# Patient Record
Sex: Male | Born: 1940
Health system: Southern US, Community
[De-identification: ages and names within clinical notes are randomized; demographics above are authoritative.]

## PROBLEM LIST (undated history)

## (undated) DIAGNOSIS — J449 Chronic obstructive pulmonary disease, unspecified: Secondary | ICD-10-CM

## (undated) DIAGNOSIS — I499 Cardiac arrhythmia, unspecified: Secondary | ICD-10-CM

## (undated) DIAGNOSIS — I10 Essential (primary) hypertension: Secondary | ICD-10-CM

## (undated) DIAGNOSIS — H9190 Unspecified hearing loss, unspecified ear: Secondary | ICD-10-CM

## (undated) DIAGNOSIS — Z972 Presence of dental prosthetic device (complete) (partial): Secondary | ICD-10-CM

## (undated) DIAGNOSIS — M1990 Unspecified osteoarthritis, unspecified site: Secondary | ICD-10-CM

## (undated) DIAGNOSIS — I739 Peripheral vascular disease, unspecified: Secondary | ICD-10-CM

## (undated) DIAGNOSIS — G4733 Obstructive sleep apnea (adult) (pediatric): Secondary | ICD-10-CM

## (undated) DIAGNOSIS — E119 Type 2 diabetes mellitus without complications: Secondary | ICD-10-CM

## (undated) DIAGNOSIS — Z992 Dependence on renal dialysis: Secondary | ICD-10-CM

## (undated) DIAGNOSIS — I2510 Atherosclerotic heart disease of native coronary artery without angina pectoris: Secondary | ICD-10-CM

## (undated) DIAGNOSIS — F32A Depression, unspecified: Secondary | ICD-10-CM

## (undated) DIAGNOSIS — K819 Cholecystitis, unspecified: Secondary | ICD-10-CM

## (undated) DIAGNOSIS — E039 Hypothyroidism, unspecified: Secondary | ICD-10-CM

## (undated) DIAGNOSIS — N289 Disorder of kidney and ureter, unspecified: Secondary | ICD-10-CM

## (undated) DIAGNOSIS — N186 End stage renal disease: Secondary | ICD-10-CM

## (undated) DIAGNOSIS — J189 Pneumonia, unspecified organism: Secondary | ICD-10-CM

## (undated) DIAGNOSIS — K810 Acute cholecystitis: Secondary | ICD-10-CM

## (undated) DIAGNOSIS — D649 Anemia, unspecified: Secondary | ICD-10-CM

## (undated) DIAGNOSIS — F329 Major depressive disorder, single episode, unspecified: Secondary | ICD-10-CM

## (undated) DIAGNOSIS — Z973 Presence of spectacles and contact lenses: Secondary | ICD-10-CM

## (undated) DIAGNOSIS — M4800 Spinal stenosis, site unspecified: Secondary | ICD-10-CM

## (undated) DIAGNOSIS — Z974 Presence of external hearing-aid: Secondary | ICD-10-CM

## (undated) DIAGNOSIS — I4891 Unspecified atrial fibrillation: Secondary | ICD-10-CM

## (undated) HISTORY — DX: Unspecified atrial fibrillation: I48.91

## (undated) HISTORY — PX: SHOULDER SURGERY: SHX246

## (undated) HISTORY — PX: MULTIPLE TOOTH EXTRACTIONS: SHX2053

## (undated) HISTORY — PX: HAND SURGERY: SHX662

## (undated) HISTORY — PX: CARDIAC CATHETERIZATION: SHX172

## (undated) HISTORY — PX: COLONOSCOPY W/ BIOPSIES AND POLYPECTOMY: SHX1376

## (undated) HISTORY — PX: TIBIA FRACTURE SURGERY: SHX806

## (undated) HISTORY — PX: EYE SURGERY: SHX253

---
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
## 2016-05-16 ENCOUNTER — Emergency Department (HOSPITAL_COMMUNITY): Payer: Medicare Other

## 2016-05-16 ENCOUNTER — Encounter (HOSPITAL_COMMUNITY): Payer: Self-pay | Admitting: Emergency Medicine

## 2016-05-16 ENCOUNTER — Emergency Department (HOSPITAL_COMMUNITY)
Admission: EM | Admit: 2016-05-16 | Discharge: 2016-05-16 | Disposition: A | Discharge status: Home / Self Care | Payer: Medicare Other | Attending: Emergency Medicine | Admitting: Emergency Medicine

## 2016-05-16 DIAGNOSIS — I120 Hypertensive chronic kidney disease with stage 5 chronic kidney disease or end stage renal disease: Secondary | ICD-10-CM | POA: Insufficient documentation

## 2016-05-16 DIAGNOSIS — Z992 Dependence on renal dialysis: Secondary | ICD-10-CM | POA: Insufficient documentation

## 2016-05-16 DIAGNOSIS — M549 Dorsalgia, unspecified: Secondary | ICD-10-CM | POA: Diagnosis present

## 2016-05-16 DIAGNOSIS — E1122 Type 2 diabetes mellitus with diabetic chronic kidney disease: Secondary | ICD-10-CM | POA: Insufficient documentation

## 2016-05-16 DIAGNOSIS — N186 End stage renal disease: Secondary | ICD-10-CM | POA: Insufficient documentation

## 2016-05-16 DIAGNOSIS — M545 Low back pain: Secondary | ICD-10-CM

## 2016-05-16 DIAGNOSIS — M5440 Lumbago with sciatica, unspecified side: Secondary | ICD-10-CM | POA: Insufficient documentation

## 2016-05-16 HISTORY — DX: Spinal stenosis, site unspecified: M48.00

## 2016-05-16 HISTORY — DX: Essential (primary) hypertension: I10

## 2016-05-16 HISTORY — DX: Type 2 diabetes mellitus without complications: E11.9

## 2016-05-16 HISTORY — DX: Disorder of kidney and ureter, unspecified: N28.9

## 2016-05-16 LAB — I-STAT CHEM 8, ED
BUN: 61 mg/dL — ABNORMAL HIGH (ref 6–20)
Calcium, Ion: 1.14 mmol/L (ref 1.13–1.30)
Chloride: 103 mmol/L (ref 101–111)
Creatinine, Ser: 7.8 mg/dL — ABNORMAL HIGH (ref 0.61–1.24)
Glucose, Bld: 112 mg/dL — ABNORMAL HIGH (ref 65–99)
HCT: 37 % — ABNORMAL LOW (ref 39.0–52.0)
Hemoglobin: 12.6 g/dL — ABNORMAL LOW (ref 13.0–17.0)
Potassium: 4 mmol/L (ref 3.5–5.1)
Sodium: 140 mmol/L (ref 135–145)
TCO2: 26 mmol/L (ref 0–100)

## 2016-05-16 MED ORDER — OXYCODONE-ACETAMINOPHEN 5-325 MG PO TABS: 1.0000 | ORAL_TABLET | Freq: Four times a day (QID) | ORAL | Status: DC | PRN

## 2016-05-16 NOTE — ED Notes (Signed)
Pt reports that he recently moved here and has also just started dialysis 3 weeks ago and was instructed to come here to speak to a nephrologist. Last dialysis treatment was Wednesday. Pt also reports back pain. Pt alert x4. NAD at this time.

## 2016-05-16 NOTE — ED Notes (Signed)
Patient transported to X-ray 

## 2016-05-16 NOTE — ED Provider Notes (Addendum)
CSN: [id number]     Arrival date & time 05/16/16  1058 History   First MD Initiated Contact with Patient 05/16/16 1121     Chief Complaint  Patient presents with  . Dialysis/per Justin Mend      (Consider location/radiation/quality/duration/timing/severity/associated sxs/prior Treatment) HPI Comments: Patient is a 75 year old male with history of end-stage renal disease on hemodialysis. He recently moved to the area after starting dialysis approximately 3 weeks ago. He was instructed to come to the emergency department to see about having his dialysis and to see a nephrologist to make arrangements to be dialyzed locally. He also reports back pain and a history of spinal stenosis. He denies any bowel or bladder incontinence and denies any weakness or numbness in his legs.  The history is provided by the patient.    Past Medical History  Diagnosis Date  . Renal disorder   . Hypertension   . Diabetes mellitus without complication ([place])   . Spinal stenosis    Past Surgical History  Procedure Laterality Date  . Shoulder surgery Right   . Hand surgery Right    No family history on file. Social History  Substance Use Topics  . Smoking status: Never Smoker   . Smokeless tobacco: None  . Alcohol Use: Yes    Review of Systems  All other systems reviewed and are negative.     Allergies  Sulfa antibiotics and Tetracyclines & related  Home Medications   Prior to Admission medications   Not on File   BP 151/71 mmHg  Pulse 68  Temp(Src) 98.2 F (36.8 C) (Oral)  Resp 16  SpO2 96% Physical Exam  Constitutional: He is oriented to person, place, and time. He appears well-developed and well-nourished. No distress.  HENT:  Head: Normocephalic and atraumatic.  Mouth/Throat: Oropharynx is clear and moist.  Neck: Normal range of motion. Neck supple.  Cardiovascular: Normal rate and regular rhythm.  Exam reveals no friction rub.   No murmur heard. Pulmonary/Chest: Effort normal and  breath sounds normal. No respiratory distress. He has no wheezes. He has no rales.  Abdominal: Soft. Bowel sounds are normal. He exhibits no distension. There is no tenderness.  Musculoskeletal: Normal range of motion. He exhibits no edema.  Neurological: He is alert and oriented to person, place, and time. No cranial nerve deficit. He exhibits normal muscle tone. Coordination normal.  Skin: Skin is warm and dry. He is not diaphoretic.  Nursing note and vitals reviewed.   ED Course  Procedures (including critical care time) Labs Review Labs Reviewed - No data to display  Imaging Review No results found. I have personally reviewed and evaluated these images and lab results as part of my medical decision-making.   EKG Interpretation None      MDM   Final diagnoses:  None    Patient is a 75 year old male who recently located here from [place]. He was started on dialysis 3 weeks ago. He was supposed to have arrangements made to begin dialysis here, however there was some confusion. He was told by the dialysis center in [place] to come to the emergency department here to begin dialysis. I discussed this with Dr. Florene Glen from nephrology. Together we have made arrangements for the patient to begin dialysis Monday morning at 11:30 at the [facility] in [place]. His laboratory studies show no hyperkalemia, no acidosis, and chest x-ray does not show volume overload. Dr. Florene Glen believes he is stable to forego dialysis until that time.  Patient is also  complaining of low back pain which she reports is due to spinal stenosis which she has been diagnosed within the past. He is reporting increasing pain. There is no evidence for an acute surgical problem. His strength and reflexes are symmetrical and there are no bowel or bladder complaints. I will prescribe a small quantity of Percocet which she can take for his pain.  Veryl Speak, MD 05/16/16 [place], MD 05/16/16 [phone]

## 2016-05-16 NOTE — Discharge Instructions (Signed)
Percocet as prescribed as needed for pain.  You have an appointment for Monday, June 26 at 11:30 AM at the [facility] dialysis center in [place]. Dr. Florene Glen and Dr. Justin Mend are aware of this. Dr. Florene Glen would suggest you call this afternoon to confirm this appointment. The phone number to call is [phone].   End-Stage Kidney Disease The kidneys are two organs that lie on either side of the spine between the middle of the back and the front of the abdomen. The kidneys:   Remove wastes and extra water from the blood.   Produce important hormones. These help keep bones strong, regulate blood pressure, and help create red blood cells.   Balance the fluids and chemicals in the blood and tissues. End-stage kidney disease occurs when the kidneys are so damaged that they cannot do their job. When the kidneys cannot do their job, life-threatening problems occur. The body cannot stay clean and strong without the help of the kidneys. In end-stage kidney disease, the kidneys cannot get better.You need a new kidney or treatments to do some of the work healthy kidneys do in order to stay alive. CAUSES  End-stage kidney disease usually occurs when a long-lasting (chronic) kidney disease gets worse. It may also occur after the kidneys are suddenly damaged (acute kidney injury).  SYMPTOMS   Swelling (edema) of the legs, ankles, or feet.   Tiredness (lethargy).   Nausea or vomiting.   Confusion.   Problems with urination, such as:   Decreased urine production.   Frequent urination, especially at night.   Frequent accidents in children who are potty trained.   Muscle twitches and cramps.   Persistent itchiness.   Loss of appetite.   Headaches.   Abnormally dark or light skin.   Numbness in the hands or feet.   Easy bruising.   Frequent hiccups.   Menstruation stops. DIAGNOSIS  Your health care provider will measure your blood pressure and take some tests. These  may include:   Urine tests.   Blood tests.   Imaging tests, such as:   An ultrasound exam.   Computed tomography (CT).  A kidney biopsy. TREATMENT  There are two treatments for end-stage kidney disease:   A procedure that removes toxic wastes from the body (dialysis).   Receiving a new kidney (kidney transplant). Both of these treatments have serious risks and consequences. Your health care provider will help you determine which treatment is best for you based on your health, age, and other factors. In addition to having dialysis or a kidney transplant, you may need to take medicines to control high blood pressure (hypertension) and cholesterol and to decrease phosphorus levels in your blood.  HOME CARE INSTRUCTIONS  Follow your prescribed diet.   Take medicines only as directed by your health care provider.   Do not take any new medicines (prescription, over-the-counter, or nutritional supplements) unless approved by your health care provider. Many medicines can worsen your kidney damage or need to have the dose adjusted.   Keep all follow-up visits as directed by your health care provider. MAKE SURE YOU:  Understand these instructions.  Will watch your condition.  Will get help right away if you are not doing well or get worse.   This information is not intended to replace advice given to you by your health care provider. Make sure you discuss any questions you have with your health care provider.   Document Released: 01/31/2004 Document Revised: 12/01/2014 Document Reviewed: 07/09/2012 Elsevier Interactive  Patient Education 2016 [employer].

## 2016-05-16 NOTE — ED Notes (Signed)
NAD at this time. Pt agreeable with d/c plan.

## 2016-05-16 NOTE — ED Notes (Signed)
Pt has fistula to the left forearm area. Thrill and Bruit present on assessment.

## 2016-05-24 DIAGNOSIS — N186 End stage renal disease: Secondary | ICD-10-CM | POA: Diagnosis not present

## 2016-05-24 DIAGNOSIS — Z23 Encounter for immunization: Secondary | ICD-10-CM | POA: Diagnosis not present

## 2016-05-24 DIAGNOSIS — E876 Hypokalemia: Secondary | ICD-10-CM | POA: Diagnosis not present

## 2016-05-27 DIAGNOSIS — Z23 Encounter for immunization: Secondary | ICD-10-CM | POA: Diagnosis not present

## 2016-05-27 DIAGNOSIS — E876 Hypokalemia: Secondary | ICD-10-CM | POA: Diagnosis not present

## 2016-05-27 DIAGNOSIS — N186 End stage renal disease: Secondary | ICD-10-CM | POA: Diagnosis not present

## 2016-05-29 DIAGNOSIS — N186 End stage renal disease: Secondary | ICD-10-CM | POA: Diagnosis not present

## 2016-05-29 DIAGNOSIS — Z23 Encounter for immunization: Secondary | ICD-10-CM | POA: Diagnosis not present

## 2016-05-29 DIAGNOSIS — E876 Hypokalemia: Secondary | ICD-10-CM | POA: Diagnosis not present

## 2016-05-31 DIAGNOSIS — N186 End stage renal disease: Secondary | ICD-10-CM | POA: Diagnosis not present

## 2016-05-31 DIAGNOSIS — Z23 Encounter for immunization: Secondary | ICD-10-CM | POA: Diagnosis not present

## 2016-05-31 DIAGNOSIS — E876 Hypokalemia: Secondary | ICD-10-CM | POA: Diagnosis not present

## 2016-06-02 DIAGNOSIS — E11319 Type 2 diabetes mellitus with unspecified diabetic retinopathy without macular edema: Secondary | ICD-10-CM | POA: Diagnosis not present

## 2016-06-02 DIAGNOSIS — Z79899 Other long term (current) drug therapy: Secondary | ICD-10-CM | POA: Diagnosis not present

## 2016-06-02 DIAGNOSIS — N186 End stage renal disease: Secondary | ICD-10-CM | POA: Diagnosis not present

## 2016-06-02 DIAGNOSIS — Z125 Encounter for screening for malignant neoplasm of prostate: Secondary | ICD-10-CM | POA: Diagnosis not present

## 2016-06-02 DIAGNOSIS — E1121 Type 2 diabetes mellitus with diabetic nephropathy: Secondary | ICD-10-CM | POA: Diagnosis not present

## 2016-06-02 DIAGNOSIS — E039 Hypothyroidism, unspecified: Secondary | ICD-10-CM | POA: Diagnosis not present

## 2016-06-02 DIAGNOSIS — E1142 Type 2 diabetes mellitus with diabetic polyneuropathy: Secondary | ICD-10-CM | POA: Diagnosis not present

## 2016-06-02 DIAGNOSIS — E785 Hyperlipidemia, unspecified: Secondary | ICD-10-CM | POA: Diagnosis not present

## 2016-06-03 DIAGNOSIS — Z23 Encounter for immunization: Secondary | ICD-10-CM | POA: Diagnosis not present

## 2016-06-03 DIAGNOSIS — E876 Hypokalemia: Secondary | ICD-10-CM | POA: Diagnosis not present

## 2016-06-03 DIAGNOSIS — N186 End stage renal disease: Secondary | ICD-10-CM | POA: Diagnosis not present

## 2016-06-05 DIAGNOSIS — E876 Hypokalemia: Secondary | ICD-10-CM | POA: Diagnosis not present

## 2016-06-05 DIAGNOSIS — Z23 Encounter for immunization: Secondary | ICD-10-CM | POA: Diagnosis not present

## 2016-06-05 DIAGNOSIS — N186 End stage renal disease: Secondary | ICD-10-CM | POA: Diagnosis not present

## 2016-06-07 DIAGNOSIS — E876 Hypokalemia: Secondary | ICD-10-CM | POA: Diagnosis not present

## 2016-06-07 DIAGNOSIS — N186 End stage renal disease: Secondary | ICD-10-CM | POA: Diagnosis not present

## 2016-06-07 DIAGNOSIS — Z23 Encounter for immunization: Secondary | ICD-10-CM | POA: Diagnosis not present

## 2016-06-10 DIAGNOSIS — N186 End stage renal disease: Secondary | ICD-10-CM | POA: Diagnosis not present

## 2016-06-10 DIAGNOSIS — Z23 Encounter for immunization: Secondary | ICD-10-CM | POA: Diagnosis not present

## 2016-06-10 DIAGNOSIS — E876 Hypokalemia: Secondary | ICD-10-CM | POA: Diagnosis not present

## 2016-06-12 DIAGNOSIS — E876 Hypokalemia: Secondary | ICD-10-CM | POA: Diagnosis not present

## 2016-06-12 DIAGNOSIS — N186 End stage renal disease: Secondary | ICD-10-CM | POA: Diagnosis not present

## 2016-06-12 DIAGNOSIS — Z23 Encounter for immunization: Secondary | ICD-10-CM | POA: Diagnosis not present

## 2016-06-14 DIAGNOSIS — N186 End stage renal disease: Secondary | ICD-10-CM | POA: Diagnosis not present

## 2016-06-14 DIAGNOSIS — Z23 Encounter for immunization: Secondary | ICD-10-CM | POA: Diagnosis not present

## 2016-06-14 DIAGNOSIS — E876 Hypokalemia: Secondary | ICD-10-CM | POA: Diagnosis not present

## 2016-06-17 DIAGNOSIS — E876 Hypokalemia: Secondary | ICD-10-CM | POA: Diagnosis not present

## 2016-06-17 DIAGNOSIS — N186 End stage renal disease: Secondary | ICD-10-CM | POA: Diagnosis not present

## 2016-06-17 DIAGNOSIS — Z23 Encounter for immunization: Secondary | ICD-10-CM | POA: Diagnosis not present

## 2016-06-19 DIAGNOSIS — E876 Hypokalemia: Secondary | ICD-10-CM | POA: Diagnosis not present

## 2016-06-19 DIAGNOSIS — N186 End stage renal disease: Secondary | ICD-10-CM | POA: Diagnosis not present

## 2016-06-19 DIAGNOSIS — Z23 Encounter for immunization: Secondary | ICD-10-CM | POA: Diagnosis not present

## 2016-06-19 DIAGNOSIS — E1121 Type 2 diabetes mellitus with diabetic nephropathy: Secondary | ICD-10-CM | POA: Diagnosis not present

## 2016-06-21 DIAGNOSIS — N186 End stage renal disease: Secondary | ICD-10-CM | POA: Diagnosis not present

## 2016-06-21 DIAGNOSIS — Z23 Encounter for immunization: Secondary | ICD-10-CM | POA: Diagnosis not present

## 2016-06-21 DIAGNOSIS — E876 Hypokalemia: Secondary | ICD-10-CM | POA: Diagnosis not present

## 2016-06-23 DIAGNOSIS — E1122 Type 2 diabetes mellitus with diabetic chronic kidney disease: Secondary | ICD-10-CM | POA: Diagnosis not present

## 2016-06-23 DIAGNOSIS — Z992 Dependence on renal dialysis: Secondary | ICD-10-CM | POA: Diagnosis not present

## 2016-06-23 DIAGNOSIS — N186 End stage renal disease: Secondary | ICD-10-CM | POA: Diagnosis not present

## 2016-06-24 DIAGNOSIS — E876 Hypokalemia: Secondary | ICD-10-CM | POA: Diagnosis not present

## 2016-06-24 DIAGNOSIS — E1121 Type 2 diabetes mellitus with diabetic nephropathy: Secondary | ICD-10-CM | POA: Diagnosis not present

## 2016-06-24 DIAGNOSIS — D509 Iron deficiency anemia, unspecified: Secondary | ICD-10-CM | POA: Diagnosis not present

## 2016-06-24 DIAGNOSIS — Z23 Encounter for immunization: Secondary | ICD-10-CM | POA: Diagnosis not present

## 2016-06-24 DIAGNOSIS — N186 End stage renal disease: Secondary | ICD-10-CM | POA: Diagnosis not present

## 2016-06-26 DIAGNOSIS — E1121 Type 2 diabetes mellitus with diabetic nephropathy: Secondary | ICD-10-CM | POA: Diagnosis not present

## 2016-06-26 DIAGNOSIS — E876 Hypokalemia: Secondary | ICD-10-CM | POA: Diagnosis not present

## 2016-06-26 DIAGNOSIS — Z23 Encounter for immunization: Secondary | ICD-10-CM | POA: Diagnosis not present

## 2016-06-26 DIAGNOSIS — N186 End stage renal disease: Secondary | ICD-10-CM | POA: Diagnosis not present

## 2016-06-26 DIAGNOSIS — D509 Iron deficiency anemia, unspecified: Secondary | ICD-10-CM | POA: Diagnosis not present

## 2016-06-27 DIAGNOSIS — Z992 Dependence on renal dialysis: Secondary | ICD-10-CM | POA: Diagnosis not present

## 2016-06-27 DIAGNOSIS — I871 Compression of vein: Secondary | ICD-10-CM | POA: Diagnosis not present

## 2016-06-27 DIAGNOSIS — N186 End stage renal disease: Secondary | ICD-10-CM | POA: Diagnosis not present

## 2016-06-27 DIAGNOSIS — T82858A Stenosis of vascular prosthetic devices, implants and grafts, initial encounter: Secondary | ICD-10-CM | POA: Diagnosis not present

## 2016-06-28 DIAGNOSIS — E1121 Type 2 diabetes mellitus with diabetic nephropathy: Secondary | ICD-10-CM | POA: Diagnosis not present

## 2016-06-28 DIAGNOSIS — D509 Iron deficiency anemia, unspecified: Secondary | ICD-10-CM | POA: Diagnosis not present

## 2016-06-28 DIAGNOSIS — E876 Hypokalemia: Secondary | ICD-10-CM | POA: Diagnosis not present

## 2016-06-28 DIAGNOSIS — Z23 Encounter for immunization: Secondary | ICD-10-CM | POA: Diagnosis not present

## 2016-06-28 DIAGNOSIS — N186 End stage renal disease: Secondary | ICD-10-CM | POA: Diagnosis not present

## 2016-06-30 DIAGNOSIS — I10 Essential (primary) hypertension: Secondary | ICD-10-CM | POA: Diagnosis not present

## 2016-06-30 DIAGNOSIS — M4726 Other spondylosis with radiculopathy, lumbar region: Secondary | ICD-10-CM | POA: Diagnosis not present

## 2016-06-30 DIAGNOSIS — Z6838 Body mass index (BMI) 38.0-38.9, adult: Secondary | ICD-10-CM | POA: Diagnosis not present

## 2016-07-01 DIAGNOSIS — Z23 Encounter for immunization: Secondary | ICD-10-CM | POA: Diagnosis not present

## 2016-07-01 DIAGNOSIS — N186 End stage renal disease: Secondary | ICD-10-CM | POA: Diagnosis not present

## 2016-07-01 DIAGNOSIS — E1121 Type 2 diabetes mellitus with diabetic nephropathy: Secondary | ICD-10-CM | POA: Diagnosis not present

## 2016-07-01 DIAGNOSIS — E876 Hypokalemia: Secondary | ICD-10-CM | POA: Diagnosis not present

## 2016-07-01 DIAGNOSIS — D509 Iron deficiency anemia, unspecified: Secondary | ICD-10-CM | POA: Diagnosis not present

## 2016-07-03 DIAGNOSIS — Z23 Encounter for immunization: Secondary | ICD-10-CM | POA: Diagnosis not present

## 2016-07-03 DIAGNOSIS — E1121 Type 2 diabetes mellitus with diabetic nephropathy: Secondary | ICD-10-CM | POA: Diagnosis not present

## 2016-07-03 DIAGNOSIS — E876 Hypokalemia: Secondary | ICD-10-CM | POA: Diagnosis not present

## 2016-07-03 DIAGNOSIS — D509 Iron deficiency anemia, unspecified: Secondary | ICD-10-CM | POA: Diagnosis not present

## 2016-07-03 DIAGNOSIS — N186 End stage renal disease: Secondary | ICD-10-CM | POA: Diagnosis not present

## 2016-07-05 DIAGNOSIS — N186 End stage renal disease: Secondary | ICD-10-CM | POA: Diagnosis not present

## 2016-07-05 DIAGNOSIS — Z23 Encounter for immunization: Secondary | ICD-10-CM | POA: Diagnosis not present

## 2016-07-05 DIAGNOSIS — E876 Hypokalemia: Secondary | ICD-10-CM | POA: Diagnosis not present

## 2016-07-05 DIAGNOSIS — D509 Iron deficiency anemia, unspecified: Secondary | ICD-10-CM | POA: Diagnosis not present

## 2016-07-05 DIAGNOSIS — E1121 Type 2 diabetes mellitus with diabetic nephropathy: Secondary | ICD-10-CM | POA: Diagnosis not present

## 2016-07-08 DIAGNOSIS — E876 Hypokalemia: Secondary | ICD-10-CM | POA: Diagnosis not present

## 2016-07-08 DIAGNOSIS — N186 End stage renal disease: Secondary | ICD-10-CM | POA: Diagnosis not present

## 2016-07-08 DIAGNOSIS — Z23 Encounter for immunization: Secondary | ICD-10-CM | POA: Diagnosis not present

## 2016-07-08 DIAGNOSIS — E1121 Type 2 diabetes mellitus with diabetic nephropathy: Secondary | ICD-10-CM | POA: Diagnosis not present

## 2016-07-08 DIAGNOSIS — D509 Iron deficiency anemia, unspecified: Secondary | ICD-10-CM | POA: Diagnosis not present

## 2016-07-10 DIAGNOSIS — E876 Hypokalemia: Secondary | ICD-10-CM | POA: Diagnosis not present

## 2016-07-10 DIAGNOSIS — N186 End stage renal disease: Secondary | ICD-10-CM | POA: Diagnosis not present

## 2016-07-10 DIAGNOSIS — E1121 Type 2 diabetes mellitus with diabetic nephropathy: Secondary | ICD-10-CM | POA: Diagnosis not present

## 2016-07-10 DIAGNOSIS — Z23 Encounter for immunization: Secondary | ICD-10-CM | POA: Diagnosis not present

## 2016-07-10 DIAGNOSIS — D509 Iron deficiency anemia, unspecified: Secondary | ICD-10-CM | POA: Diagnosis not present

## 2016-07-11 DIAGNOSIS — M4306 Spondylolysis, lumbar region: Secondary | ICD-10-CM | POA: Diagnosis not present

## 2016-07-11 DIAGNOSIS — M5127 Other intervertebral disc displacement, lumbosacral region: Secondary | ICD-10-CM | POA: Diagnosis not present

## 2016-07-11 DIAGNOSIS — M5126 Other intervertebral disc displacement, lumbar region: Secondary | ICD-10-CM | POA: Diagnosis not present

## 2016-07-11 DIAGNOSIS — M4806 Spinal stenosis, lumbar region: Secondary | ICD-10-CM | POA: Diagnosis not present

## 2016-07-11 DIAGNOSIS — M4807 Spinal stenosis, lumbosacral region: Secondary | ICD-10-CM | POA: Diagnosis not present

## 2016-07-11 DIAGNOSIS — M4726 Other spondylosis with radiculopathy, lumbar region: Secondary | ICD-10-CM | POA: Diagnosis not present

## 2016-07-12 DIAGNOSIS — D509 Iron deficiency anemia, unspecified: Secondary | ICD-10-CM | POA: Diagnosis not present

## 2016-07-12 DIAGNOSIS — E1121 Type 2 diabetes mellitus with diabetic nephropathy: Secondary | ICD-10-CM | POA: Diagnosis not present

## 2016-07-12 DIAGNOSIS — Z23 Encounter for immunization: Secondary | ICD-10-CM | POA: Diagnosis not present

## 2016-07-12 DIAGNOSIS — N186 End stage renal disease: Secondary | ICD-10-CM | POA: Diagnosis not present

## 2016-07-12 DIAGNOSIS — E876 Hypokalemia: Secondary | ICD-10-CM | POA: Diagnosis not present

## 2016-07-15 DIAGNOSIS — D509 Iron deficiency anemia, unspecified: Secondary | ICD-10-CM | POA: Diagnosis not present

## 2016-07-15 DIAGNOSIS — Z23 Encounter for immunization: Secondary | ICD-10-CM | POA: Diagnosis not present

## 2016-07-15 DIAGNOSIS — E1121 Type 2 diabetes mellitus with diabetic nephropathy: Secondary | ICD-10-CM | POA: Diagnosis not present

## 2016-07-15 DIAGNOSIS — N186 End stage renal disease: Secondary | ICD-10-CM | POA: Diagnosis not present

## 2016-07-15 DIAGNOSIS — E876 Hypokalemia: Secondary | ICD-10-CM | POA: Diagnosis not present

## 2016-07-17 DIAGNOSIS — Z23 Encounter for immunization: Secondary | ICD-10-CM | POA: Diagnosis not present

## 2016-07-17 DIAGNOSIS — E876 Hypokalemia: Secondary | ICD-10-CM | POA: Diagnosis not present

## 2016-07-17 DIAGNOSIS — N186 End stage renal disease: Secondary | ICD-10-CM | POA: Diagnosis not present

## 2016-07-17 DIAGNOSIS — E1121 Type 2 diabetes mellitus with diabetic nephropathy: Secondary | ICD-10-CM | POA: Diagnosis not present

## 2016-07-17 DIAGNOSIS — D509 Iron deficiency anemia, unspecified: Secondary | ICD-10-CM | POA: Diagnosis not present

## 2016-07-19 DIAGNOSIS — E1121 Type 2 diabetes mellitus with diabetic nephropathy: Secondary | ICD-10-CM | POA: Diagnosis not present

## 2016-07-19 DIAGNOSIS — N186 End stage renal disease: Secondary | ICD-10-CM | POA: Diagnosis not present

## 2016-07-19 DIAGNOSIS — Z23 Encounter for immunization: Secondary | ICD-10-CM | POA: Diagnosis not present

## 2016-07-19 DIAGNOSIS — D509 Iron deficiency anemia, unspecified: Secondary | ICD-10-CM | POA: Diagnosis not present

## 2016-07-19 DIAGNOSIS — E876 Hypokalemia: Secondary | ICD-10-CM | POA: Diagnosis not present

## 2016-07-22 DIAGNOSIS — D509 Iron deficiency anemia, unspecified: Secondary | ICD-10-CM | POA: Diagnosis not present

## 2016-07-22 DIAGNOSIS — N186 End stage renal disease: Secondary | ICD-10-CM | POA: Diagnosis not present

## 2016-07-22 DIAGNOSIS — Z23 Encounter for immunization: Secondary | ICD-10-CM | POA: Diagnosis not present

## 2016-07-22 DIAGNOSIS — E876 Hypokalemia: Secondary | ICD-10-CM | POA: Diagnosis not present

## 2016-07-22 DIAGNOSIS — E1121 Type 2 diabetes mellitus with diabetic nephropathy: Secondary | ICD-10-CM | POA: Diagnosis not present

## 2016-07-24 DIAGNOSIS — D509 Iron deficiency anemia, unspecified: Secondary | ICD-10-CM | POA: Diagnosis not present

## 2016-07-24 DIAGNOSIS — Z992 Dependence on renal dialysis: Secondary | ICD-10-CM | POA: Diagnosis not present

## 2016-07-24 DIAGNOSIS — Z23 Encounter for immunization: Secondary | ICD-10-CM | POA: Diagnosis not present

## 2016-07-24 DIAGNOSIS — E1121 Type 2 diabetes mellitus with diabetic nephropathy: Secondary | ICD-10-CM | POA: Diagnosis not present

## 2016-07-24 DIAGNOSIS — E876 Hypokalemia: Secondary | ICD-10-CM | POA: Diagnosis not present

## 2016-07-24 DIAGNOSIS — N186 End stage renal disease: Secondary | ICD-10-CM | POA: Diagnosis not present

## 2016-07-24 DIAGNOSIS — E1122 Type 2 diabetes mellitus with diabetic chronic kidney disease: Secondary | ICD-10-CM | POA: Diagnosis not present

## 2016-07-26 DIAGNOSIS — N186 End stage renal disease: Secondary | ICD-10-CM | POA: Diagnosis not present

## 2016-07-26 DIAGNOSIS — Z23 Encounter for immunization: Secondary | ICD-10-CM | POA: Diagnosis not present

## 2016-07-26 DIAGNOSIS — E876 Hypokalemia: Secondary | ICD-10-CM | POA: Diagnosis not present

## 2016-07-26 DIAGNOSIS — E1121 Type 2 diabetes mellitus with diabetic nephropathy: Secondary | ICD-10-CM | POA: Diagnosis not present

## 2016-07-26 DIAGNOSIS — D509 Iron deficiency anemia, unspecified: Secondary | ICD-10-CM | POA: Diagnosis not present

## 2016-07-29 DIAGNOSIS — N186 End stage renal disease: Secondary | ICD-10-CM | POA: Diagnosis not present

## 2016-07-29 DIAGNOSIS — E1121 Type 2 diabetes mellitus with diabetic nephropathy: Secondary | ICD-10-CM | POA: Diagnosis not present

## 2016-07-29 DIAGNOSIS — E876 Hypokalemia: Secondary | ICD-10-CM | POA: Diagnosis not present

## 2016-07-29 DIAGNOSIS — D509 Iron deficiency anemia, unspecified: Secondary | ICD-10-CM | POA: Diagnosis not present

## 2016-07-29 DIAGNOSIS — Z23 Encounter for immunization: Secondary | ICD-10-CM | POA: Diagnosis not present

## 2016-07-31 DIAGNOSIS — E1121 Type 2 diabetes mellitus with diabetic nephropathy: Secondary | ICD-10-CM | POA: Diagnosis not present

## 2016-07-31 DIAGNOSIS — Z23 Encounter for immunization: Secondary | ICD-10-CM | POA: Diagnosis not present

## 2016-07-31 DIAGNOSIS — N186 End stage renal disease: Secondary | ICD-10-CM | POA: Diagnosis not present

## 2016-07-31 DIAGNOSIS — D509 Iron deficiency anemia, unspecified: Secondary | ICD-10-CM | POA: Diagnosis not present

## 2016-07-31 DIAGNOSIS — E876 Hypokalemia: Secondary | ICD-10-CM | POA: Diagnosis not present

## 2016-08-01 DIAGNOSIS — H3582 Retinal ischemia: Secondary | ICD-10-CM | POA: Diagnosis not present

## 2016-08-01 DIAGNOSIS — E113593 Type 2 diabetes mellitus with proliferative diabetic retinopathy without macular edema, bilateral: Secondary | ICD-10-CM | POA: Diagnosis not present

## 2016-08-02 DIAGNOSIS — E1121 Type 2 diabetes mellitus with diabetic nephropathy: Secondary | ICD-10-CM | POA: Diagnosis not present

## 2016-08-02 DIAGNOSIS — D509 Iron deficiency anemia, unspecified: Secondary | ICD-10-CM | POA: Diagnosis not present

## 2016-08-02 DIAGNOSIS — Z23 Encounter for immunization: Secondary | ICD-10-CM | POA: Diagnosis not present

## 2016-08-02 DIAGNOSIS — N186 End stage renal disease: Secondary | ICD-10-CM | POA: Diagnosis not present

## 2016-08-02 DIAGNOSIS — E876 Hypokalemia: Secondary | ICD-10-CM | POA: Diagnosis not present

## 2016-08-05 DIAGNOSIS — N186 End stage renal disease: Secondary | ICD-10-CM | POA: Diagnosis not present

## 2016-08-05 DIAGNOSIS — Z23 Encounter for immunization: Secondary | ICD-10-CM | POA: Diagnosis not present

## 2016-08-05 DIAGNOSIS — E1121 Type 2 diabetes mellitus with diabetic nephropathy: Secondary | ICD-10-CM | POA: Diagnosis not present

## 2016-08-05 DIAGNOSIS — D509 Iron deficiency anemia, unspecified: Secondary | ICD-10-CM | POA: Diagnosis not present

## 2016-08-05 DIAGNOSIS — E876 Hypokalemia: Secondary | ICD-10-CM | POA: Diagnosis not present

## 2016-08-07 DIAGNOSIS — Z23 Encounter for immunization: Secondary | ICD-10-CM | POA: Diagnosis not present

## 2016-08-07 DIAGNOSIS — N186 End stage renal disease: Secondary | ICD-10-CM | POA: Diagnosis not present

## 2016-08-07 DIAGNOSIS — D509 Iron deficiency anemia, unspecified: Secondary | ICD-10-CM | POA: Diagnosis not present

## 2016-08-07 DIAGNOSIS — E876 Hypokalemia: Secondary | ICD-10-CM | POA: Diagnosis not present

## 2016-08-07 DIAGNOSIS — E1121 Type 2 diabetes mellitus with diabetic nephropathy: Secondary | ICD-10-CM | POA: Diagnosis not present

## 2016-08-09 DIAGNOSIS — Z23 Encounter for immunization: Secondary | ICD-10-CM | POA: Diagnosis not present

## 2016-08-09 DIAGNOSIS — E1121 Type 2 diabetes mellitus with diabetic nephropathy: Secondary | ICD-10-CM | POA: Diagnosis not present

## 2016-08-09 DIAGNOSIS — D509 Iron deficiency anemia, unspecified: Secondary | ICD-10-CM | POA: Diagnosis not present

## 2016-08-09 DIAGNOSIS — E876 Hypokalemia: Secondary | ICD-10-CM | POA: Diagnosis not present

## 2016-08-09 DIAGNOSIS — N186 End stage renal disease: Secondary | ICD-10-CM | POA: Diagnosis not present

## 2016-08-11 DIAGNOSIS — M5126 Other intervertebral disc displacement, lumbar region: Secondary | ICD-10-CM | POA: Diagnosis not present

## 2016-08-12 DIAGNOSIS — D509 Iron deficiency anemia, unspecified: Secondary | ICD-10-CM | POA: Diagnosis not present

## 2016-08-12 DIAGNOSIS — E1121 Type 2 diabetes mellitus with diabetic nephropathy: Secondary | ICD-10-CM | POA: Diagnosis not present

## 2016-08-12 DIAGNOSIS — Z23 Encounter for immunization: Secondary | ICD-10-CM | POA: Diagnosis not present

## 2016-08-12 DIAGNOSIS — E876 Hypokalemia: Secondary | ICD-10-CM | POA: Diagnosis not present

## 2016-08-12 DIAGNOSIS — N186 End stage renal disease: Secondary | ICD-10-CM | POA: Diagnosis not present

## 2016-08-14 DIAGNOSIS — N186 End stage renal disease: Secondary | ICD-10-CM | POA: Diagnosis not present

## 2016-08-14 DIAGNOSIS — Z23 Encounter for immunization: Secondary | ICD-10-CM | POA: Diagnosis not present

## 2016-08-14 DIAGNOSIS — E1121 Type 2 diabetes mellitus with diabetic nephropathy: Secondary | ICD-10-CM | POA: Diagnosis not present

## 2016-08-14 DIAGNOSIS — D509 Iron deficiency anemia, unspecified: Secondary | ICD-10-CM | POA: Diagnosis not present

## 2016-08-14 DIAGNOSIS — E876 Hypokalemia: Secondary | ICD-10-CM | POA: Diagnosis not present

## 2016-08-16 DIAGNOSIS — N186 End stage renal disease: Secondary | ICD-10-CM | POA: Diagnosis not present

## 2016-08-16 DIAGNOSIS — Z23 Encounter for immunization: Secondary | ICD-10-CM | POA: Diagnosis not present

## 2016-08-16 DIAGNOSIS — E1121 Type 2 diabetes mellitus with diabetic nephropathy: Secondary | ICD-10-CM | POA: Diagnosis not present

## 2016-08-16 DIAGNOSIS — E876 Hypokalemia: Secondary | ICD-10-CM | POA: Diagnosis not present

## 2016-08-16 DIAGNOSIS — D509 Iron deficiency anemia, unspecified: Secondary | ICD-10-CM | POA: Diagnosis not present

## 2016-08-19 DIAGNOSIS — E876 Hypokalemia: Secondary | ICD-10-CM | POA: Diagnosis not present

## 2016-08-19 DIAGNOSIS — D509 Iron deficiency anemia, unspecified: Secondary | ICD-10-CM | POA: Diagnosis not present

## 2016-08-19 DIAGNOSIS — E1121 Type 2 diabetes mellitus with diabetic nephropathy: Secondary | ICD-10-CM | POA: Diagnosis not present

## 2016-08-19 DIAGNOSIS — Z23 Encounter for immunization: Secondary | ICD-10-CM | POA: Diagnosis not present

## 2016-08-19 DIAGNOSIS — N186 End stage renal disease: Secondary | ICD-10-CM | POA: Diagnosis not present

## 2016-08-21 DIAGNOSIS — N186 End stage renal disease: Secondary | ICD-10-CM | POA: Diagnosis not present

## 2016-08-21 DIAGNOSIS — Z23 Encounter for immunization: Secondary | ICD-10-CM | POA: Diagnosis not present

## 2016-08-21 DIAGNOSIS — E1121 Type 2 diabetes mellitus with diabetic nephropathy: Secondary | ICD-10-CM | POA: Diagnosis not present

## 2016-08-21 DIAGNOSIS — E876 Hypokalemia: Secondary | ICD-10-CM | POA: Diagnosis not present

## 2016-08-21 DIAGNOSIS — D509 Iron deficiency anemia, unspecified: Secondary | ICD-10-CM | POA: Diagnosis not present

## 2016-08-23 DIAGNOSIS — D509 Iron deficiency anemia, unspecified: Secondary | ICD-10-CM | POA: Diagnosis not present

## 2016-08-23 DIAGNOSIS — E1122 Type 2 diabetes mellitus with diabetic chronic kidney disease: Secondary | ICD-10-CM | POA: Diagnosis not present

## 2016-08-23 DIAGNOSIS — E876 Hypokalemia: Secondary | ICD-10-CM | POA: Diagnosis not present

## 2016-08-23 DIAGNOSIS — N186 End stage renal disease: Secondary | ICD-10-CM | POA: Diagnosis not present

## 2016-08-23 DIAGNOSIS — Z23 Encounter for immunization: Secondary | ICD-10-CM | POA: Diagnosis not present

## 2016-08-23 DIAGNOSIS — E1121 Type 2 diabetes mellitus with diabetic nephropathy: Secondary | ICD-10-CM | POA: Diagnosis not present

## 2016-08-23 DIAGNOSIS — Z992 Dependence on renal dialysis: Secondary | ICD-10-CM | POA: Diagnosis not present

## 2016-08-26 DIAGNOSIS — E876 Hypokalemia: Secondary | ICD-10-CM | POA: Diagnosis not present

## 2016-08-26 DIAGNOSIS — D509 Iron deficiency anemia, unspecified: Secondary | ICD-10-CM | POA: Diagnosis not present

## 2016-08-26 DIAGNOSIS — N186 End stage renal disease: Secondary | ICD-10-CM | POA: Diagnosis not present

## 2016-08-26 DIAGNOSIS — N2581 Secondary hyperparathyroidism of renal origin: Secondary | ICD-10-CM | POA: Diagnosis not present

## 2016-08-26 DIAGNOSIS — Z23 Encounter for immunization: Secondary | ICD-10-CM | POA: Diagnosis not present

## 2016-08-28 DIAGNOSIS — Z23 Encounter for immunization: Secondary | ICD-10-CM | POA: Diagnosis not present

## 2016-08-28 DIAGNOSIS — E876 Hypokalemia: Secondary | ICD-10-CM | POA: Diagnosis not present

## 2016-08-28 DIAGNOSIS — D509 Iron deficiency anemia, unspecified: Secondary | ICD-10-CM | POA: Diagnosis not present

## 2016-08-28 DIAGNOSIS — N2581 Secondary hyperparathyroidism of renal origin: Secondary | ICD-10-CM | POA: Diagnosis not present

## 2016-08-28 DIAGNOSIS — N186 End stage renal disease: Secondary | ICD-10-CM | POA: Diagnosis not present

## 2016-08-29 DIAGNOSIS — M5136 Other intervertebral disc degeneration, lumbar region: Secondary | ICD-10-CM | POA: Diagnosis not present

## 2016-08-29 DIAGNOSIS — Z6838 Body mass index (BMI) 38.0-38.9, adult: Secondary | ICD-10-CM | POA: Diagnosis not present

## 2016-08-29 DIAGNOSIS — M5416 Radiculopathy, lumbar region: Secondary | ICD-10-CM | POA: Diagnosis not present

## 2016-08-29 DIAGNOSIS — M4726 Other spondylosis with radiculopathy, lumbar region: Secondary | ICD-10-CM | POA: Diagnosis not present

## 2016-08-30 DIAGNOSIS — E876 Hypokalemia: Secondary | ICD-10-CM | POA: Diagnosis not present

## 2016-08-30 DIAGNOSIS — Z23 Encounter for immunization: Secondary | ICD-10-CM | POA: Diagnosis not present

## 2016-08-30 DIAGNOSIS — D509 Iron deficiency anemia, unspecified: Secondary | ICD-10-CM | POA: Diagnosis not present

## 2016-08-30 DIAGNOSIS — N2581 Secondary hyperparathyroidism of renal origin: Secondary | ICD-10-CM | POA: Diagnosis not present

## 2016-08-30 DIAGNOSIS — N186 End stage renal disease: Secondary | ICD-10-CM | POA: Diagnosis not present

## 2016-09-02 DIAGNOSIS — D509 Iron deficiency anemia, unspecified: Secondary | ICD-10-CM | POA: Diagnosis not present

## 2016-09-02 DIAGNOSIS — N2581 Secondary hyperparathyroidism of renal origin: Secondary | ICD-10-CM | POA: Diagnosis not present

## 2016-09-02 DIAGNOSIS — Z23 Encounter for immunization: Secondary | ICD-10-CM | POA: Diagnosis not present

## 2016-09-02 DIAGNOSIS — E876 Hypokalemia: Secondary | ICD-10-CM | POA: Diagnosis not present

## 2016-09-02 DIAGNOSIS — N186 End stage renal disease: Secondary | ICD-10-CM | POA: Diagnosis not present

## 2016-09-04 DIAGNOSIS — N2581 Secondary hyperparathyroidism of renal origin: Secondary | ICD-10-CM | POA: Diagnosis not present

## 2016-09-04 DIAGNOSIS — Z23 Encounter for immunization: Secondary | ICD-10-CM | POA: Diagnosis not present

## 2016-09-04 DIAGNOSIS — E876 Hypokalemia: Secondary | ICD-10-CM | POA: Diagnosis not present

## 2016-09-04 DIAGNOSIS — D509 Iron deficiency anemia, unspecified: Secondary | ICD-10-CM | POA: Diagnosis not present

## 2016-09-04 DIAGNOSIS — N186 End stage renal disease: Secondary | ICD-10-CM | POA: Diagnosis not present

## 2016-09-06 DIAGNOSIS — E876 Hypokalemia: Secondary | ICD-10-CM | POA: Diagnosis not present

## 2016-09-06 DIAGNOSIS — Z23 Encounter for immunization: Secondary | ICD-10-CM | POA: Diagnosis not present

## 2016-09-06 DIAGNOSIS — N186 End stage renal disease: Secondary | ICD-10-CM | POA: Diagnosis not present

## 2016-09-06 DIAGNOSIS — D509 Iron deficiency anemia, unspecified: Secondary | ICD-10-CM | POA: Diagnosis not present

## 2016-09-06 DIAGNOSIS — N2581 Secondary hyperparathyroidism of renal origin: Secondary | ICD-10-CM | POA: Diagnosis not present

## 2016-09-09 DIAGNOSIS — D509 Iron deficiency anemia, unspecified: Secondary | ICD-10-CM | POA: Diagnosis not present

## 2016-09-09 DIAGNOSIS — E876 Hypokalemia: Secondary | ICD-10-CM | POA: Diagnosis not present

## 2016-09-09 DIAGNOSIS — N2581 Secondary hyperparathyroidism of renal origin: Secondary | ICD-10-CM | POA: Diagnosis not present

## 2016-09-09 DIAGNOSIS — Z23 Encounter for immunization: Secondary | ICD-10-CM | POA: Diagnosis not present

## 2016-09-09 DIAGNOSIS — N186 End stage renal disease: Secondary | ICD-10-CM | POA: Diagnosis not present

## 2016-09-11 DIAGNOSIS — N2581 Secondary hyperparathyroidism of renal origin: Secondary | ICD-10-CM | POA: Diagnosis not present

## 2016-09-11 DIAGNOSIS — Z23 Encounter for immunization: Secondary | ICD-10-CM | POA: Diagnosis not present

## 2016-09-11 DIAGNOSIS — D509 Iron deficiency anemia, unspecified: Secondary | ICD-10-CM | POA: Diagnosis not present

## 2016-09-11 DIAGNOSIS — N186 End stage renal disease: Secondary | ICD-10-CM | POA: Diagnosis not present

## 2016-09-11 DIAGNOSIS — E876 Hypokalemia: Secondary | ICD-10-CM | POA: Diagnosis not present

## 2016-09-12 DIAGNOSIS — M5126 Other intervertebral disc displacement, lumbar region: Secondary | ICD-10-CM | POA: Diagnosis not present

## 2016-09-12 DIAGNOSIS — M4726 Other spondylosis with radiculopathy, lumbar region: Secondary | ICD-10-CM | POA: Diagnosis not present

## 2016-09-13 DIAGNOSIS — D509 Iron deficiency anemia, unspecified: Secondary | ICD-10-CM | POA: Diagnosis not present

## 2016-09-13 DIAGNOSIS — N2581 Secondary hyperparathyroidism of renal origin: Secondary | ICD-10-CM | POA: Diagnosis not present

## 2016-09-13 DIAGNOSIS — N186 End stage renal disease: Secondary | ICD-10-CM | POA: Diagnosis not present

## 2016-09-13 DIAGNOSIS — Z23 Encounter for immunization: Secondary | ICD-10-CM | POA: Diagnosis not present

## 2016-09-13 DIAGNOSIS — E876 Hypokalemia: Secondary | ICD-10-CM | POA: Diagnosis not present

## 2016-09-15 DIAGNOSIS — E1121 Type 2 diabetes mellitus with diabetic nephropathy: Secondary | ICD-10-CM | POA: Diagnosis not present

## 2016-09-16 DIAGNOSIS — N186 End stage renal disease: Secondary | ICD-10-CM | POA: Diagnosis not present

## 2016-09-16 DIAGNOSIS — E876 Hypokalemia: Secondary | ICD-10-CM | POA: Diagnosis not present

## 2016-09-16 DIAGNOSIS — Z23 Encounter for immunization: Secondary | ICD-10-CM | POA: Diagnosis not present

## 2016-09-16 DIAGNOSIS — D509 Iron deficiency anemia, unspecified: Secondary | ICD-10-CM | POA: Diagnosis not present

## 2016-09-16 DIAGNOSIS — N2581 Secondary hyperparathyroidism of renal origin: Secondary | ICD-10-CM | POA: Diagnosis not present

## 2016-09-18 DIAGNOSIS — D509 Iron deficiency anemia, unspecified: Secondary | ICD-10-CM | POA: Diagnosis not present

## 2016-09-18 DIAGNOSIS — E876 Hypokalemia: Secondary | ICD-10-CM | POA: Diagnosis not present

## 2016-09-18 DIAGNOSIS — Z23 Encounter for immunization: Secondary | ICD-10-CM | POA: Diagnosis not present

## 2016-09-18 DIAGNOSIS — N186 End stage renal disease: Secondary | ICD-10-CM | POA: Diagnosis not present

## 2016-09-18 DIAGNOSIS — N2581 Secondary hyperparathyroidism of renal origin: Secondary | ICD-10-CM | POA: Diagnosis not present

## 2016-09-18 DIAGNOSIS — E1121 Type 2 diabetes mellitus with diabetic nephropathy: Secondary | ICD-10-CM | POA: Diagnosis not present

## 2016-09-19 DIAGNOSIS — M5136 Other intervertebral disc degeneration, lumbar region: Secondary | ICD-10-CM | POA: Diagnosis not present

## 2016-09-19 DIAGNOSIS — M5126 Other intervertebral disc displacement, lumbar region: Secondary | ICD-10-CM | POA: Diagnosis not present

## 2016-09-20 DIAGNOSIS — N2581 Secondary hyperparathyroidism of renal origin: Secondary | ICD-10-CM | POA: Diagnosis not present

## 2016-09-20 DIAGNOSIS — D509 Iron deficiency anemia, unspecified: Secondary | ICD-10-CM | POA: Diagnosis not present

## 2016-09-20 DIAGNOSIS — Z23 Encounter for immunization: Secondary | ICD-10-CM | POA: Diagnosis not present

## 2016-09-20 DIAGNOSIS — N186 End stage renal disease: Secondary | ICD-10-CM | POA: Diagnosis not present

## 2016-09-20 DIAGNOSIS — E876 Hypokalemia: Secondary | ICD-10-CM | POA: Diagnosis not present

## 2016-09-22 DIAGNOSIS — E785 Hyperlipidemia, unspecified: Secondary | ICD-10-CM | POA: Diagnosis not present

## 2016-09-22 DIAGNOSIS — E11319 Type 2 diabetes mellitus with unspecified diabetic retinopathy without macular edema: Secondary | ICD-10-CM | POA: Diagnosis not present

## 2016-09-22 DIAGNOSIS — E1121 Type 2 diabetes mellitus with diabetic nephropathy: Secondary | ICD-10-CM | POA: Diagnosis not present

## 2016-09-22 DIAGNOSIS — I10 Essential (primary) hypertension: Secondary | ICD-10-CM | POA: Diagnosis not present

## 2016-09-23 DIAGNOSIS — E1122 Type 2 diabetes mellitus with diabetic chronic kidney disease: Secondary | ICD-10-CM | POA: Diagnosis not present

## 2016-09-23 DIAGNOSIS — N186 End stage renal disease: Secondary | ICD-10-CM | POA: Diagnosis not present

## 2016-09-23 DIAGNOSIS — E876 Hypokalemia: Secondary | ICD-10-CM | POA: Diagnosis not present

## 2016-09-23 DIAGNOSIS — N2581 Secondary hyperparathyroidism of renal origin: Secondary | ICD-10-CM | POA: Diagnosis not present

## 2016-09-23 DIAGNOSIS — Z23 Encounter for immunization: Secondary | ICD-10-CM | POA: Diagnosis not present

## 2016-09-23 DIAGNOSIS — D509 Iron deficiency anemia, unspecified: Secondary | ICD-10-CM | POA: Diagnosis not present

## 2016-09-23 DIAGNOSIS — Z992 Dependence on renal dialysis: Secondary | ICD-10-CM | POA: Diagnosis not present

## 2016-09-25 DIAGNOSIS — N186 End stage renal disease: Secondary | ICD-10-CM | POA: Diagnosis not present

## 2016-09-25 DIAGNOSIS — N2581 Secondary hyperparathyroidism of renal origin: Secondary | ICD-10-CM | POA: Diagnosis not present

## 2016-09-25 DIAGNOSIS — E876 Hypokalemia: Secondary | ICD-10-CM | POA: Diagnosis not present

## 2016-09-25 DIAGNOSIS — E1121 Type 2 diabetes mellitus with diabetic nephropathy: Secondary | ICD-10-CM | POA: Diagnosis not present

## 2016-09-27 DIAGNOSIS — E1121 Type 2 diabetes mellitus with diabetic nephropathy: Secondary | ICD-10-CM | POA: Diagnosis not present

## 2016-09-27 DIAGNOSIS — N2581 Secondary hyperparathyroidism of renal origin: Secondary | ICD-10-CM | POA: Diagnosis not present

## 2016-09-27 DIAGNOSIS — E876 Hypokalemia: Secondary | ICD-10-CM | POA: Diagnosis not present

## 2016-09-27 DIAGNOSIS — N186 End stage renal disease: Secondary | ICD-10-CM | POA: Diagnosis not present

## 2016-09-30 DIAGNOSIS — N186 End stage renal disease: Secondary | ICD-10-CM | POA: Diagnosis not present

## 2016-09-30 DIAGNOSIS — E1121 Type 2 diabetes mellitus with diabetic nephropathy: Secondary | ICD-10-CM | POA: Diagnosis not present

## 2016-09-30 DIAGNOSIS — E876 Hypokalemia: Secondary | ICD-10-CM | POA: Diagnosis not present

## 2016-09-30 DIAGNOSIS — N2581 Secondary hyperparathyroidism of renal origin: Secondary | ICD-10-CM | POA: Diagnosis not present

## 2016-10-02 DIAGNOSIS — N2581 Secondary hyperparathyroidism of renal origin: Secondary | ICD-10-CM | POA: Diagnosis not present

## 2016-10-02 DIAGNOSIS — E1121 Type 2 diabetes mellitus with diabetic nephropathy: Secondary | ICD-10-CM | POA: Diagnosis not present

## 2016-10-02 DIAGNOSIS — N186 End stage renal disease: Secondary | ICD-10-CM | POA: Diagnosis not present

## 2016-10-02 DIAGNOSIS — E876 Hypokalemia: Secondary | ICD-10-CM | POA: Diagnosis not present

## 2016-10-04 DIAGNOSIS — E876 Hypokalemia: Secondary | ICD-10-CM | POA: Diagnosis not present

## 2016-10-04 DIAGNOSIS — N186 End stage renal disease: Secondary | ICD-10-CM | POA: Diagnosis not present

## 2016-10-04 DIAGNOSIS — N2581 Secondary hyperparathyroidism of renal origin: Secondary | ICD-10-CM | POA: Diagnosis not present

## 2016-10-04 DIAGNOSIS — E1121 Type 2 diabetes mellitus with diabetic nephropathy: Secondary | ICD-10-CM | POA: Diagnosis not present

## 2016-10-07 DIAGNOSIS — N2581 Secondary hyperparathyroidism of renal origin: Secondary | ICD-10-CM | POA: Diagnosis not present

## 2016-10-07 DIAGNOSIS — E876 Hypokalemia: Secondary | ICD-10-CM | POA: Diagnosis not present

## 2016-10-07 DIAGNOSIS — N186 End stage renal disease: Secondary | ICD-10-CM | POA: Diagnosis not present

## 2016-10-07 DIAGNOSIS — E1121 Type 2 diabetes mellitus with diabetic nephropathy: Secondary | ICD-10-CM | POA: Diagnosis not present

## 2016-10-09 DIAGNOSIS — E1121 Type 2 diabetes mellitus with diabetic nephropathy: Secondary | ICD-10-CM | POA: Diagnosis not present

## 2016-10-09 DIAGNOSIS — N2581 Secondary hyperparathyroidism of renal origin: Secondary | ICD-10-CM | POA: Diagnosis not present

## 2016-10-09 DIAGNOSIS — E876 Hypokalemia: Secondary | ICD-10-CM | POA: Diagnosis not present

## 2016-10-09 DIAGNOSIS — N186 End stage renal disease: Secondary | ICD-10-CM | POA: Diagnosis not present

## 2016-10-11 DIAGNOSIS — E876 Hypokalemia: Secondary | ICD-10-CM | POA: Diagnosis not present

## 2016-10-11 DIAGNOSIS — N2581 Secondary hyperparathyroidism of renal origin: Secondary | ICD-10-CM | POA: Diagnosis not present

## 2016-10-11 DIAGNOSIS — E1121 Type 2 diabetes mellitus with diabetic nephropathy: Secondary | ICD-10-CM | POA: Diagnosis not present

## 2016-10-11 DIAGNOSIS — N186 End stage renal disease: Secondary | ICD-10-CM | POA: Diagnosis not present

## 2016-10-14 DIAGNOSIS — E876 Hypokalemia: Secondary | ICD-10-CM | POA: Diagnosis not present

## 2016-10-14 DIAGNOSIS — N186 End stage renal disease: Secondary | ICD-10-CM | POA: Diagnosis not present

## 2016-10-14 DIAGNOSIS — N2581 Secondary hyperparathyroidism of renal origin: Secondary | ICD-10-CM | POA: Diagnosis not present

## 2016-10-14 DIAGNOSIS — E1121 Type 2 diabetes mellitus with diabetic nephropathy: Secondary | ICD-10-CM | POA: Diagnosis not present

## 2016-10-16 DIAGNOSIS — E876 Hypokalemia: Secondary | ICD-10-CM | POA: Diagnosis not present

## 2016-10-16 DIAGNOSIS — N186 End stage renal disease: Secondary | ICD-10-CM | POA: Diagnosis not present

## 2016-10-16 DIAGNOSIS — N2581 Secondary hyperparathyroidism of renal origin: Secondary | ICD-10-CM | POA: Diagnosis not present

## 2016-10-16 DIAGNOSIS — E1121 Type 2 diabetes mellitus with diabetic nephropathy: Secondary | ICD-10-CM | POA: Diagnosis not present

## 2016-10-18 DIAGNOSIS — N186 End stage renal disease: Secondary | ICD-10-CM | POA: Diagnosis not present

## 2016-10-18 DIAGNOSIS — N2581 Secondary hyperparathyroidism of renal origin: Secondary | ICD-10-CM | POA: Diagnosis not present

## 2016-10-18 DIAGNOSIS — E876 Hypokalemia: Secondary | ICD-10-CM | POA: Diagnosis not present

## 2016-10-18 DIAGNOSIS — E1121 Type 2 diabetes mellitus with diabetic nephropathy: Secondary | ICD-10-CM | POA: Diagnosis not present

## 2016-10-21 DIAGNOSIS — N2581 Secondary hyperparathyroidism of renal origin: Secondary | ICD-10-CM | POA: Diagnosis not present

## 2016-10-21 DIAGNOSIS — E1121 Type 2 diabetes mellitus with diabetic nephropathy: Secondary | ICD-10-CM | POA: Diagnosis not present

## 2016-10-21 DIAGNOSIS — E876 Hypokalemia: Secondary | ICD-10-CM | POA: Diagnosis not present

## 2016-10-21 DIAGNOSIS — N186 End stage renal disease: Secondary | ICD-10-CM | POA: Diagnosis not present

## 2016-10-23 DIAGNOSIS — E876 Hypokalemia: Secondary | ICD-10-CM | POA: Diagnosis not present

## 2016-10-23 DIAGNOSIS — E1122 Type 2 diabetes mellitus with diabetic chronic kidney disease: Secondary | ICD-10-CM | POA: Diagnosis not present

## 2016-10-23 DIAGNOSIS — E1121 Type 2 diabetes mellitus with diabetic nephropathy: Secondary | ICD-10-CM | POA: Diagnosis not present

## 2016-10-23 DIAGNOSIS — N186 End stage renal disease: Secondary | ICD-10-CM | POA: Diagnosis not present

## 2016-10-23 DIAGNOSIS — Z992 Dependence on renal dialysis: Secondary | ICD-10-CM | POA: Diagnosis not present

## 2016-10-23 DIAGNOSIS — N2581 Secondary hyperparathyroidism of renal origin: Secondary | ICD-10-CM | POA: Diagnosis not present

## 2016-10-25 DIAGNOSIS — E1121 Type 2 diabetes mellitus with diabetic nephropathy: Secondary | ICD-10-CM | POA: Diagnosis not present

## 2016-10-25 DIAGNOSIS — N2581 Secondary hyperparathyroidism of renal origin: Secondary | ICD-10-CM | POA: Diagnosis not present

## 2016-10-25 DIAGNOSIS — E876 Hypokalemia: Secondary | ICD-10-CM | POA: Diagnosis not present

## 2016-10-25 DIAGNOSIS — N186 End stage renal disease: Secondary | ICD-10-CM | POA: Diagnosis not present

## 2016-10-25 DIAGNOSIS — Z23 Encounter for immunization: Secondary | ICD-10-CM | POA: Diagnosis not present

## 2016-10-28 DIAGNOSIS — N2581 Secondary hyperparathyroidism of renal origin: Secondary | ICD-10-CM | POA: Diagnosis not present

## 2016-10-28 DIAGNOSIS — Z23 Encounter for immunization: Secondary | ICD-10-CM | POA: Diagnosis not present

## 2016-10-28 DIAGNOSIS — N186 End stage renal disease: Secondary | ICD-10-CM | POA: Diagnosis not present

## 2016-10-28 DIAGNOSIS — E1121 Type 2 diabetes mellitus with diabetic nephropathy: Secondary | ICD-10-CM | POA: Diagnosis not present

## 2016-10-28 DIAGNOSIS — E876 Hypokalemia: Secondary | ICD-10-CM | POA: Diagnosis not present

## 2016-10-29 DIAGNOSIS — M5126 Other intervertebral disc displacement, lumbar region: Secondary | ICD-10-CM | POA: Diagnosis not present

## 2016-10-29 DIAGNOSIS — I10 Essential (primary) hypertension: Secondary | ICD-10-CM | POA: Diagnosis not present

## 2016-10-29 DIAGNOSIS — Z6837 Body mass index (BMI) 37.0-37.9, adult: Secondary | ICD-10-CM | POA: Diagnosis not present

## 2016-10-30 DIAGNOSIS — N2581 Secondary hyperparathyroidism of renal origin: Secondary | ICD-10-CM | POA: Diagnosis not present

## 2016-10-30 DIAGNOSIS — N186 End stage renal disease: Secondary | ICD-10-CM | POA: Diagnosis not present

## 2016-10-30 DIAGNOSIS — E1121 Type 2 diabetes mellitus with diabetic nephropathy: Secondary | ICD-10-CM | POA: Diagnosis not present

## 2016-10-30 DIAGNOSIS — E876 Hypokalemia: Secondary | ICD-10-CM | POA: Diagnosis not present

## 2016-10-30 DIAGNOSIS — Z23 Encounter for immunization: Secondary | ICD-10-CM | POA: Diagnosis not present

## 2016-10-31 DIAGNOSIS — I272 Pulmonary hypertension, unspecified: Secondary | ICD-10-CM | POA: Diagnosis not present

## 2016-10-31 DIAGNOSIS — I517 Cardiomegaly: Secondary | ICD-10-CM | POA: Diagnosis not present

## 2016-11-01 DIAGNOSIS — E1121 Type 2 diabetes mellitus with diabetic nephropathy: Secondary | ICD-10-CM | POA: Diagnosis not present

## 2016-11-01 DIAGNOSIS — N186 End stage renal disease: Secondary | ICD-10-CM | POA: Diagnosis not present

## 2016-11-01 DIAGNOSIS — N2581 Secondary hyperparathyroidism of renal origin: Secondary | ICD-10-CM | POA: Diagnosis not present

## 2016-11-01 DIAGNOSIS — E876 Hypokalemia: Secondary | ICD-10-CM | POA: Diagnosis not present

## 2016-11-01 DIAGNOSIS — Z23 Encounter for immunization: Secondary | ICD-10-CM | POA: Diagnosis not present

## 2016-11-04 DIAGNOSIS — N186 End stage renal disease: Secondary | ICD-10-CM | POA: Diagnosis not present

## 2016-11-04 DIAGNOSIS — E1121 Type 2 diabetes mellitus with diabetic nephropathy: Secondary | ICD-10-CM | POA: Diagnosis not present

## 2016-11-04 DIAGNOSIS — N2581 Secondary hyperparathyroidism of renal origin: Secondary | ICD-10-CM | POA: Diagnosis not present

## 2016-11-04 DIAGNOSIS — E876 Hypokalemia: Secondary | ICD-10-CM | POA: Diagnosis not present

## 2016-11-04 DIAGNOSIS — Z23 Encounter for immunization: Secondary | ICD-10-CM | POA: Diagnosis not present

## 2016-11-06 DIAGNOSIS — E1121 Type 2 diabetes mellitus with diabetic nephropathy: Secondary | ICD-10-CM | POA: Diagnosis not present

## 2016-11-06 DIAGNOSIS — N2581 Secondary hyperparathyroidism of renal origin: Secondary | ICD-10-CM | POA: Diagnosis not present

## 2016-11-06 DIAGNOSIS — Z23 Encounter for immunization: Secondary | ICD-10-CM | POA: Diagnosis not present

## 2016-11-06 DIAGNOSIS — N186 End stage renal disease: Secondary | ICD-10-CM | POA: Diagnosis not present

## 2016-11-06 DIAGNOSIS — E876 Hypokalemia: Secondary | ICD-10-CM | POA: Diagnosis not present

## 2016-11-08 DIAGNOSIS — N2581 Secondary hyperparathyroidism of renal origin: Secondary | ICD-10-CM | POA: Diagnosis not present

## 2016-11-08 DIAGNOSIS — N186 End stage renal disease: Secondary | ICD-10-CM | POA: Diagnosis not present

## 2016-11-08 DIAGNOSIS — E876 Hypokalemia: Secondary | ICD-10-CM | POA: Diagnosis not present

## 2016-11-08 DIAGNOSIS — E1121 Type 2 diabetes mellitus with diabetic nephropathy: Secondary | ICD-10-CM | POA: Diagnosis not present

## 2016-11-08 DIAGNOSIS — Z23 Encounter for immunization: Secondary | ICD-10-CM | POA: Diagnosis not present

## 2016-11-10 DIAGNOSIS — J00 Acute nasopharyngitis [common cold]: Secondary | ICD-10-CM | POA: Diagnosis not present

## 2016-11-11 DIAGNOSIS — Z23 Encounter for immunization: Secondary | ICD-10-CM | POA: Diagnosis not present

## 2016-11-11 DIAGNOSIS — N2581 Secondary hyperparathyroidism of renal origin: Secondary | ICD-10-CM | POA: Diagnosis not present

## 2016-11-11 DIAGNOSIS — N186 End stage renal disease: Secondary | ICD-10-CM | POA: Diagnosis not present

## 2016-11-11 DIAGNOSIS — E1121 Type 2 diabetes mellitus with diabetic nephropathy: Secondary | ICD-10-CM | POA: Diagnosis not present

## 2016-11-11 DIAGNOSIS — E876 Hypokalemia: Secondary | ICD-10-CM | POA: Diagnosis not present

## 2016-11-13 DIAGNOSIS — N186 End stage renal disease: Secondary | ICD-10-CM | POA: Diagnosis not present

## 2016-11-13 DIAGNOSIS — E876 Hypokalemia: Secondary | ICD-10-CM | POA: Diagnosis not present

## 2016-11-13 DIAGNOSIS — N2581 Secondary hyperparathyroidism of renal origin: Secondary | ICD-10-CM | POA: Diagnosis not present

## 2016-11-13 DIAGNOSIS — Z23 Encounter for immunization: Secondary | ICD-10-CM | POA: Diagnosis not present

## 2016-11-13 DIAGNOSIS — E1121 Type 2 diabetes mellitus with diabetic nephropathy: Secondary | ICD-10-CM | POA: Diagnosis not present

## 2016-11-15 DIAGNOSIS — Z23 Encounter for immunization: Secondary | ICD-10-CM | POA: Diagnosis not present

## 2016-11-15 DIAGNOSIS — N2581 Secondary hyperparathyroidism of renal origin: Secondary | ICD-10-CM | POA: Diagnosis not present

## 2016-11-15 DIAGNOSIS — N186 End stage renal disease: Secondary | ICD-10-CM | POA: Diagnosis not present

## 2016-11-15 DIAGNOSIS — E1121 Type 2 diabetes mellitus with diabetic nephropathy: Secondary | ICD-10-CM | POA: Diagnosis not present

## 2016-11-15 DIAGNOSIS — E876 Hypokalemia: Secondary | ICD-10-CM | POA: Diagnosis not present

## 2016-11-18 DIAGNOSIS — N2581 Secondary hyperparathyroidism of renal origin: Secondary | ICD-10-CM | POA: Diagnosis not present

## 2016-11-18 DIAGNOSIS — E876 Hypokalemia: Secondary | ICD-10-CM | POA: Diagnosis not present

## 2016-11-18 DIAGNOSIS — N186 End stage renal disease: Secondary | ICD-10-CM | POA: Diagnosis not present

## 2016-11-18 DIAGNOSIS — E1121 Type 2 diabetes mellitus with diabetic nephropathy: Secondary | ICD-10-CM | POA: Diagnosis not present

## 2016-11-18 DIAGNOSIS — Z23 Encounter for immunization: Secondary | ICD-10-CM | POA: Diagnosis not present

## 2016-11-20 DIAGNOSIS — E1121 Type 2 diabetes mellitus with diabetic nephropathy: Secondary | ICD-10-CM | POA: Diagnosis not present

## 2016-11-20 DIAGNOSIS — N2581 Secondary hyperparathyroidism of renal origin: Secondary | ICD-10-CM | POA: Diagnosis not present

## 2016-11-20 DIAGNOSIS — N186 End stage renal disease: Secondary | ICD-10-CM | POA: Diagnosis not present

## 2016-11-20 DIAGNOSIS — Z23 Encounter for immunization: Secondary | ICD-10-CM | POA: Diagnosis not present

## 2016-11-20 DIAGNOSIS — E876 Hypokalemia: Secondary | ICD-10-CM | POA: Diagnosis not present

## 2016-11-22 DIAGNOSIS — E1121 Type 2 diabetes mellitus with diabetic nephropathy: Secondary | ICD-10-CM | POA: Diagnosis not present

## 2016-11-22 DIAGNOSIS — N2581 Secondary hyperparathyroidism of renal origin: Secondary | ICD-10-CM | POA: Diagnosis not present

## 2016-11-22 DIAGNOSIS — Z23 Encounter for immunization: Secondary | ICD-10-CM | POA: Diagnosis not present

## 2016-11-22 DIAGNOSIS — E876 Hypokalemia: Secondary | ICD-10-CM | POA: Diagnosis not present

## 2016-11-22 DIAGNOSIS — N186 End stage renal disease: Secondary | ICD-10-CM | POA: Diagnosis not present

## 2016-11-23 DIAGNOSIS — E1122 Type 2 diabetes mellitus with diabetic chronic kidney disease: Secondary | ICD-10-CM | POA: Diagnosis not present

## 2016-11-23 DIAGNOSIS — Z992 Dependence on renal dialysis: Secondary | ICD-10-CM | POA: Diagnosis not present

## 2016-11-23 DIAGNOSIS — N186 End stage renal disease: Secondary | ICD-10-CM | POA: Diagnosis not present

## 2016-11-25 DIAGNOSIS — N2581 Secondary hyperparathyroidism of renal origin: Secondary | ICD-10-CM | POA: Diagnosis not present

## 2016-11-25 DIAGNOSIS — N186 End stage renal disease: Secondary | ICD-10-CM | POA: Diagnosis not present

## 2016-11-25 DIAGNOSIS — E876 Hypokalemia: Secondary | ICD-10-CM | POA: Diagnosis not present

## 2016-11-27 DIAGNOSIS — N186 End stage renal disease: Secondary | ICD-10-CM | POA: Diagnosis not present

## 2016-11-27 DIAGNOSIS — E876 Hypokalemia: Secondary | ICD-10-CM | POA: Diagnosis not present

## 2016-11-27 DIAGNOSIS — N2581 Secondary hyperparathyroidism of renal origin: Secondary | ICD-10-CM | POA: Diagnosis not present

## 2016-11-29 DIAGNOSIS — N186 End stage renal disease: Secondary | ICD-10-CM | POA: Diagnosis not present

## 2016-11-29 DIAGNOSIS — E876 Hypokalemia: Secondary | ICD-10-CM | POA: Diagnosis not present

## 2016-11-29 DIAGNOSIS — N2581 Secondary hyperparathyroidism of renal origin: Secondary | ICD-10-CM | POA: Diagnosis not present

## 2016-12-02 DIAGNOSIS — N2581 Secondary hyperparathyroidism of renal origin: Secondary | ICD-10-CM | POA: Diagnosis not present

## 2016-12-02 DIAGNOSIS — N186 End stage renal disease: Secondary | ICD-10-CM | POA: Diagnosis not present

## 2016-12-02 DIAGNOSIS — E876 Hypokalemia: Secondary | ICD-10-CM | POA: Diagnosis not present

## 2016-12-04 DIAGNOSIS — N2581 Secondary hyperparathyroidism of renal origin: Secondary | ICD-10-CM | POA: Diagnosis not present

## 2016-12-04 DIAGNOSIS — E876 Hypokalemia: Secondary | ICD-10-CM | POA: Diagnosis not present

## 2016-12-04 DIAGNOSIS — N186 End stage renal disease: Secondary | ICD-10-CM | POA: Diagnosis not present

## 2016-12-05 DIAGNOSIS — H3582 Retinal ischemia: Secondary | ICD-10-CM | POA: Diagnosis not present

## 2016-12-05 DIAGNOSIS — E113592 Type 2 diabetes mellitus with proliferative diabetic retinopathy without macular edema, left eye: Secondary | ICD-10-CM | POA: Diagnosis not present

## 2016-12-05 DIAGNOSIS — E113511 Type 2 diabetes mellitus with proliferative diabetic retinopathy with macular edema, right eye: Secondary | ICD-10-CM | POA: Diagnosis not present

## 2016-12-06 DIAGNOSIS — N186 End stage renal disease: Secondary | ICD-10-CM | POA: Diagnosis not present

## 2016-12-06 DIAGNOSIS — N2581 Secondary hyperparathyroidism of renal origin: Secondary | ICD-10-CM | POA: Diagnosis not present

## 2016-12-06 DIAGNOSIS — E876 Hypokalemia: Secondary | ICD-10-CM | POA: Diagnosis not present

## 2016-12-09 DIAGNOSIS — N186 End stage renal disease: Secondary | ICD-10-CM | POA: Diagnosis not present

## 2016-12-09 DIAGNOSIS — N2581 Secondary hyperparathyroidism of renal origin: Secondary | ICD-10-CM | POA: Diagnosis not present

## 2016-12-09 DIAGNOSIS — E876 Hypokalemia: Secondary | ICD-10-CM | POA: Diagnosis not present

## 2016-12-11 DIAGNOSIS — E876 Hypokalemia: Secondary | ICD-10-CM | POA: Diagnosis not present

## 2016-12-11 DIAGNOSIS — N2581 Secondary hyperparathyroidism of renal origin: Secondary | ICD-10-CM | POA: Diagnosis not present

## 2016-12-11 DIAGNOSIS — N186 End stage renal disease: Secondary | ICD-10-CM | POA: Diagnosis not present

## 2016-12-13 DIAGNOSIS — N186 End stage renal disease: Secondary | ICD-10-CM | POA: Diagnosis not present

## 2016-12-13 DIAGNOSIS — E876 Hypokalemia: Secondary | ICD-10-CM | POA: Diagnosis not present

## 2016-12-13 DIAGNOSIS — N2581 Secondary hyperparathyroidism of renal origin: Secondary | ICD-10-CM | POA: Diagnosis not present

## 2016-12-16 DIAGNOSIS — N2581 Secondary hyperparathyroidism of renal origin: Secondary | ICD-10-CM | POA: Diagnosis not present

## 2016-12-16 DIAGNOSIS — N186 End stage renal disease: Secondary | ICD-10-CM | POA: Diagnosis not present

## 2016-12-16 DIAGNOSIS — E876 Hypokalemia: Secondary | ICD-10-CM | POA: Diagnosis not present

## 2016-12-18 DIAGNOSIS — N2581 Secondary hyperparathyroidism of renal origin: Secondary | ICD-10-CM | POA: Diagnosis not present

## 2016-12-18 DIAGNOSIS — N186 End stage renal disease: Secondary | ICD-10-CM | POA: Diagnosis not present

## 2016-12-18 DIAGNOSIS — E876 Hypokalemia: Secondary | ICD-10-CM | POA: Diagnosis not present

## 2016-12-18 DIAGNOSIS — E1121 Type 2 diabetes mellitus with diabetic nephropathy: Secondary | ICD-10-CM | POA: Diagnosis not present

## 2016-12-20 DIAGNOSIS — N186 End stage renal disease: Secondary | ICD-10-CM | POA: Diagnosis not present

## 2016-12-20 DIAGNOSIS — E876 Hypokalemia: Secondary | ICD-10-CM | POA: Diagnosis not present

## 2016-12-20 DIAGNOSIS — N2581 Secondary hyperparathyroidism of renal origin: Secondary | ICD-10-CM | POA: Diagnosis not present

## 2016-12-22 DIAGNOSIS — M5126 Other intervertebral disc displacement, lumbar region: Secondary | ICD-10-CM | POA: Diagnosis not present

## 2016-12-23 DIAGNOSIS — N2581 Secondary hyperparathyroidism of renal origin: Secondary | ICD-10-CM | POA: Diagnosis not present

## 2016-12-23 DIAGNOSIS — E876 Hypokalemia: Secondary | ICD-10-CM | POA: Diagnosis not present

## 2016-12-23 DIAGNOSIS — N186 End stage renal disease: Secondary | ICD-10-CM | POA: Diagnosis not present

## 2016-12-24 DIAGNOSIS — E1122 Type 2 diabetes mellitus with diabetic chronic kidney disease: Secondary | ICD-10-CM | POA: Diagnosis not present

## 2016-12-24 DIAGNOSIS — Z992 Dependence on renal dialysis: Secondary | ICD-10-CM | POA: Diagnosis not present

## 2016-12-24 DIAGNOSIS — N186 End stage renal disease: Secondary | ICD-10-CM | POA: Diagnosis not present

## 2016-12-25 DIAGNOSIS — E876 Hypokalemia: Secondary | ICD-10-CM | POA: Diagnosis not present

## 2016-12-25 DIAGNOSIS — E1121 Type 2 diabetes mellitus with diabetic nephropathy: Secondary | ICD-10-CM | POA: Diagnosis not present

## 2016-12-25 DIAGNOSIS — D509 Iron deficiency anemia, unspecified: Secondary | ICD-10-CM | POA: Diagnosis not present

## 2016-12-25 DIAGNOSIS — N186 End stage renal disease: Secondary | ICD-10-CM | POA: Diagnosis not present

## 2016-12-25 DIAGNOSIS — N2581 Secondary hyperparathyroidism of renal origin: Secondary | ICD-10-CM | POA: Diagnosis not present

## 2016-12-27 DIAGNOSIS — N186 End stage renal disease: Secondary | ICD-10-CM | POA: Diagnosis not present

## 2016-12-27 DIAGNOSIS — N2581 Secondary hyperparathyroidism of renal origin: Secondary | ICD-10-CM | POA: Diagnosis not present

## 2016-12-27 DIAGNOSIS — E1121 Type 2 diabetes mellitus with diabetic nephropathy: Secondary | ICD-10-CM | POA: Diagnosis not present

## 2016-12-27 DIAGNOSIS — D509 Iron deficiency anemia, unspecified: Secondary | ICD-10-CM | POA: Diagnosis not present

## 2016-12-27 DIAGNOSIS — E876 Hypokalemia: Secondary | ICD-10-CM | POA: Diagnosis not present

## 2016-12-30 DIAGNOSIS — N2581 Secondary hyperparathyroidism of renal origin: Secondary | ICD-10-CM | POA: Diagnosis not present

## 2016-12-30 DIAGNOSIS — E876 Hypokalemia: Secondary | ICD-10-CM | POA: Diagnosis not present

## 2016-12-30 DIAGNOSIS — D509 Iron deficiency anemia, unspecified: Secondary | ICD-10-CM | POA: Diagnosis not present

## 2016-12-30 DIAGNOSIS — E1121 Type 2 diabetes mellitus with diabetic nephropathy: Secondary | ICD-10-CM | POA: Diagnosis not present

## 2016-12-30 DIAGNOSIS — N186 End stage renal disease: Secondary | ICD-10-CM | POA: Diagnosis not present

## 2017-01-01 DIAGNOSIS — E1121 Type 2 diabetes mellitus with diabetic nephropathy: Secondary | ICD-10-CM | POA: Diagnosis not present

## 2017-01-01 DIAGNOSIS — D509 Iron deficiency anemia, unspecified: Secondary | ICD-10-CM | POA: Diagnosis not present

## 2017-01-01 DIAGNOSIS — N2581 Secondary hyperparathyroidism of renal origin: Secondary | ICD-10-CM | POA: Diagnosis not present

## 2017-01-01 DIAGNOSIS — N186 End stage renal disease: Secondary | ICD-10-CM | POA: Diagnosis not present

## 2017-01-01 DIAGNOSIS — E876 Hypokalemia: Secondary | ICD-10-CM | POA: Diagnosis not present

## 2017-01-03 DIAGNOSIS — D509 Iron deficiency anemia, unspecified: Secondary | ICD-10-CM | POA: Diagnosis not present

## 2017-01-03 DIAGNOSIS — N2581 Secondary hyperparathyroidism of renal origin: Secondary | ICD-10-CM | POA: Diagnosis not present

## 2017-01-03 DIAGNOSIS — E1121 Type 2 diabetes mellitus with diabetic nephropathy: Secondary | ICD-10-CM | POA: Diagnosis not present

## 2017-01-03 DIAGNOSIS — N186 End stage renal disease: Secondary | ICD-10-CM | POA: Diagnosis not present

## 2017-01-03 DIAGNOSIS — E876 Hypokalemia: Secondary | ICD-10-CM | POA: Diagnosis not present

## 2017-01-05 DIAGNOSIS — I959 Hypotension, unspecified: Secondary | ICD-10-CM | POA: Diagnosis not present

## 2017-01-05 DIAGNOSIS — J329 Chronic sinusitis, unspecified: Secondary | ICD-10-CM | POA: Diagnosis not present

## 2017-01-05 DIAGNOSIS — I2510 Atherosclerotic heart disease of native coronary artery without angina pectoris: Secondary | ICD-10-CM | POA: Diagnosis not present

## 2017-01-05 DIAGNOSIS — I5032 Chronic diastolic (congestive) heart failure: Secondary | ICD-10-CM | POA: Diagnosis not present

## 2017-01-06 DIAGNOSIS — D509 Iron deficiency anemia, unspecified: Secondary | ICD-10-CM | POA: Diagnosis not present

## 2017-01-06 DIAGNOSIS — E876 Hypokalemia: Secondary | ICD-10-CM | POA: Diagnosis not present

## 2017-01-06 DIAGNOSIS — N186 End stage renal disease: Secondary | ICD-10-CM | POA: Diagnosis not present

## 2017-01-06 DIAGNOSIS — N2581 Secondary hyperparathyroidism of renal origin: Secondary | ICD-10-CM | POA: Diagnosis not present

## 2017-01-06 DIAGNOSIS — E1121 Type 2 diabetes mellitus with diabetic nephropathy: Secondary | ICD-10-CM | POA: Diagnosis not present

## 2017-01-08 DIAGNOSIS — E1121 Type 2 diabetes mellitus with diabetic nephropathy: Secondary | ICD-10-CM | POA: Diagnosis not present

## 2017-01-08 DIAGNOSIS — N186 End stage renal disease: Secondary | ICD-10-CM | POA: Diagnosis not present

## 2017-01-08 DIAGNOSIS — N2581 Secondary hyperparathyroidism of renal origin: Secondary | ICD-10-CM | POA: Diagnosis not present

## 2017-01-08 DIAGNOSIS — E876 Hypokalemia: Secondary | ICD-10-CM | POA: Diagnosis not present

## 2017-01-08 DIAGNOSIS — D509 Iron deficiency anemia, unspecified: Secondary | ICD-10-CM | POA: Diagnosis not present

## 2017-01-10 DIAGNOSIS — D509 Iron deficiency anemia, unspecified: Secondary | ICD-10-CM | POA: Diagnosis not present

## 2017-01-10 DIAGNOSIS — N2581 Secondary hyperparathyroidism of renal origin: Secondary | ICD-10-CM | POA: Diagnosis not present

## 2017-01-10 DIAGNOSIS — E876 Hypokalemia: Secondary | ICD-10-CM | POA: Diagnosis not present

## 2017-01-10 DIAGNOSIS — N186 End stage renal disease: Secondary | ICD-10-CM | POA: Diagnosis not present

## 2017-01-10 DIAGNOSIS — E1121 Type 2 diabetes mellitus with diabetic nephropathy: Secondary | ICD-10-CM | POA: Diagnosis not present

## 2017-01-13 DIAGNOSIS — N2581 Secondary hyperparathyroidism of renal origin: Secondary | ICD-10-CM | POA: Diagnosis not present

## 2017-01-13 DIAGNOSIS — N186 End stage renal disease: Secondary | ICD-10-CM | POA: Diagnosis not present

## 2017-01-13 DIAGNOSIS — E876 Hypokalemia: Secondary | ICD-10-CM | POA: Diagnosis not present

## 2017-01-13 DIAGNOSIS — D509 Iron deficiency anemia, unspecified: Secondary | ICD-10-CM | POA: Diagnosis not present

## 2017-01-13 DIAGNOSIS — E1121 Type 2 diabetes mellitus with diabetic nephropathy: Secondary | ICD-10-CM | POA: Diagnosis not present

## 2017-01-15 DIAGNOSIS — E876 Hypokalemia: Secondary | ICD-10-CM | POA: Diagnosis not present

## 2017-01-15 DIAGNOSIS — N186 End stage renal disease: Secondary | ICD-10-CM | POA: Diagnosis not present

## 2017-01-15 DIAGNOSIS — E1121 Type 2 diabetes mellitus with diabetic nephropathy: Secondary | ICD-10-CM | POA: Diagnosis not present

## 2017-01-15 DIAGNOSIS — N2581 Secondary hyperparathyroidism of renal origin: Secondary | ICD-10-CM | POA: Diagnosis not present

## 2017-01-15 DIAGNOSIS — D509 Iron deficiency anemia, unspecified: Secondary | ICD-10-CM | POA: Diagnosis not present

## 2017-01-17 DIAGNOSIS — E876 Hypokalemia: Secondary | ICD-10-CM | POA: Diagnosis not present

## 2017-01-17 DIAGNOSIS — N2581 Secondary hyperparathyroidism of renal origin: Secondary | ICD-10-CM | POA: Diagnosis not present

## 2017-01-17 DIAGNOSIS — D509 Iron deficiency anemia, unspecified: Secondary | ICD-10-CM | POA: Diagnosis not present

## 2017-01-17 DIAGNOSIS — N186 End stage renal disease: Secondary | ICD-10-CM | POA: Diagnosis not present

## 2017-01-17 DIAGNOSIS — E1121 Type 2 diabetes mellitus with diabetic nephropathy: Secondary | ICD-10-CM | POA: Diagnosis not present

## 2017-01-19 DIAGNOSIS — M4726 Other spondylosis with radiculopathy, lumbar region: Secondary | ICD-10-CM | POA: Diagnosis not present

## 2017-01-19 DIAGNOSIS — M5126 Other intervertebral disc displacement, lumbar region: Secondary | ICD-10-CM | POA: Diagnosis not present

## 2017-01-19 DIAGNOSIS — I10 Essential (primary) hypertension: Secondary | ICD-10-CM | POA: Diagnosis not present

## 2017-01-19 DIAGNOSIS — Z6837 Body mass index (BMI) 37.0-37.9, adult: Secondary | ICD-10-CM | POA: Diagnosis not present

## 2017-01-20 DIAGNOSIS — E1121 Type 2 diabetes mellitus with diabetic nephropathy: Secondary | ICD-10-CM | POA: Diagnosis not present

## 2017-01-20 DIAGNOSIS — E876 Hypokalemia: Secondary | ICD-10-CM | POA: Diagnosis not present

## 2017-01-20 DIAGNOSIS — N2581 Secondary hyperparathyroidism of renal origin: Secondary | ICD-10-CM | POA: Diagnosis not present

## 2017-01-20 DIAGNOSIS — D509 Iron deficiency anemia, unspecified: Secondary | ICD-10-CM | POA: Diagnosis not present

## 2017-01-20 DIAGNOSIS — N186 End stage renal disease: Secondary | ICD-10-CM | POA: Diagnosis not present

## 2017-01-21 DIAGNOSIS — N186 End stage renal disease: Secondary | ICD-10-CM | POA: Diagnosis not present

## 2017-01-21 DIAGNOSIS — E1122 Type 2 diabetes mellitus with diabetic chronic kidney disease: Secondary | ICD-10-CM | POA: Diagnosis not present

## 2017-01-21 DIAGNOSIS — Z992 Dependence on renal dialysis: Secondary | ICD-10-CM | POA: Diagnosis not present

## 2017-01-22 DIAGNOSIS — E1121 Type 2 diabetes mellitus with diabetic nephropathy: Secondary | ICD-10-CM | POA: Diagnosis not present

## 2017-01-22 DIAGNOSIS — E876 Hypokalemia: Secondary | ICD-10-CM | POA: Diagnosis not present

## 2017-01-22 DIAGNOSIS — N2581 Secondary hyperparathyroidism of renal origin: Secondary | ICD-10-CM | POA: Diagnosis not present

## 2017-01-22 DIAGNOSIS — N186 End stage renal disease: Secondary | ICD-10-CM | POA: Diagnosis not present

## 2017-01-24 DIAGNOSIS — N186 End stage renal disease: Secondary | ICD-10-CM | POA: Diagnosis not present

## 2017-01-24 DIAGNOSIS — E1121 Type 2 diabetes mellitus with diabetic nephropathy: Secondary | ICD-10-CM | POA: Diagnosis not present

## 2017-01-24 DIAGNOSIS — E876 Hypokalemia: Secondary | ICD-10-CM | POA: Diagnosis not present

## 2017-01-24 DIAGNOSIS — N2581 Secondary hyperparathyroidism of renal origin: Secondary | ICD-10-CM | POA: Diagnosis not present

## 2017-01-27 DIAGNOSIS — E876 Hypokalemia: Secondary | ICD-10-CM | POA: Diagnosis not present

## 2017-01-27 DIAGNOSIS — N2581 Secondary hyperparathyroidism of renal origin: Secondary | ICD-10-CM | POA: Diagnosis not present

## 2017-01-27 DIAGNOSIS — E1121 Type 2 diabetes mellitus with diabetic nephropathy: Secondary | ICD-10-CM | POA: Diagnosis not present

## 2017-01-27 DIAGNOSIS — N186 End stage renal disease: Secondary | ICD-10-CM | POA: Diagnosis not present

## 2017-01-29 DIAGNOSIS — E876 Hypokalemia: Secondary | ICD-10-CM | POA: Diagnosis not present

## 2017-01-29 DIAGNOSIS — N2581 Secondary hyperparathyroidism of renal origin: Secondary | ICD-10-CM | POA: Diagnosis not present

## 2017-01-29 DIAGNOSIS — N186 End stage renal disease: Secondary | ICD-10-CM | POA: Diagnosis not present

## 2017-01-29 DIAGNOSIS — E1121 Type 2 diabetes mellitus with diabetic nephropathy: Secondary | ICD-10-CM | POA: Diagnosis not present

## 2017-01-30 DIAGNOSIS — Z6837 Body mass index (BMI) 37.0-37.9, adult: Secondary | ICD-10-CM | POA: Diagnosis not present

## 2017-01-30 DIAGNOSIS — I2510 Atherosclerotic heart disease of native coronary artery without angina pectoris: Secondary | ICD-10-CM | POA: Diagnosis not present

## 2017-01-30 DIAGNOSIS — I1310 Hypertensive heart and chronic kidney disease without heart failure, with stage 1 through stage 4 chronic kidney disease, or unspecified chronic kidney disease: Secondary | ICD-10-CM | POA: Diagnosis not present

## 2017-01-30 DIAGNOSIS — I447 Left bundle-branch block, unspecified: Secondary | ICD-10-CM | POA: Diagnosis not present

## 2017-01-30 DIAGNOSIS — I5032 Chronic diastolic (congestive) heart failure: Secondary | ICD-10-CM | POA: Diagnosis not present

## 2017-01-30 DIAGNOSIS — I509 Heart failure, unspecified: Secondary | ICD-10-CM | POA: Diagnosis not present

## 2017-01-30 DIAGNOSIS — I959 Hypotension, unspecified: Secondary | ICD-10-CM | POA: Diagnosis not present

## 2017-01-30 DIAGNOSIS — Z992 Dependence on renal dialysis: Secondary | ICD-10-CM | POA: Diagnosis not present

## 2017-01-30 DIAGNOSIS — N186 End stage renal disease: Secondary | ICD-10-CM | POA: Diagnosis not present

## 2017-01-31 DIAGNOSIS — N186 End stage renal disease: Secondary | ICD-10-CM | POA: Diagnosis not present

## 2017-01-31 DIAGNOSIS — N2581 Secondary hyperparathyroidism of renal origin: Secondary | ICD-10-CM | POA: Diagnosis not present

## 2017-01-31 DIAGNOSIS — E1121 Type 2 diabetes mellitus with diabetic nephropathy: Secondary | ICD-10-CM | POA: Diagnosis not present

## 2017-01-31 DIAGNOSIS — E876 Hypokalemia: Secondary | ICD-10-CM | POA: Diagnosis not present

## 2017-02-03 DIAGNOSIS — E1121 Type 2 diabetes mellitus with diabetic nephropathy: Secondary | ICD-10-CM | POA: Diagnosis not present

## 2017-02-03 DIAGNOSIS — N186 End stage renal disease: Secondary | ICD-10-CM | POA: Diagnosis not present

## 2017-02-03 DIAGNOSIS — N2581 Secondary hyperparathyroidism of renal origin: Secondary | ICD-10-CM | POA: Diagnosis not present

## 2017-02-03 DIAGNOSIS — E876 Hypokalemia: Secondary | ICD-10-CM | POA: Diagnosis not present

## 2017-02-05 DIAGNOSIS — E1121 Type 2 diabetes mellitus with diabetic nephropathy: Secondary | ICD-10-CM | POA: Diagnosis not present

## 2017-02-05 DIAGNOSIS — N186 End stage renal disease: Secondary | ICD-10-CM | POA: Diagnosis not present

## 2017-02-05 DIAGNOSIS — N2581 Secondary hyperparathyroidism of renal origin: Secondary | ICD-10-CM | POA: Diagnosis not present

## 2017-02-05 DIAGNOSIS — E876 Hypokalemia: Secondary | ICD-10-CM | POA: Diagnosis not present

## 2017-02-07 DIAGNOSIS — E1121 Type 2 diabetes mellitus with diabetic nephropathy: Secondary | ICD-10-CM | POA: Diagnosis not present

## 2017-02-07 DIAGNOSIS — E876 Hypokalemia: Secondary | ICD-10-CM | POA: Diagnosis not present

## 2017-02-07 DIAGNOSIS — N2581 Secondary hyperparathyroidism of renal origin: Secondary | ICD-10-CM | POA: Diagnosis not present

## 2017-02-07 DIAGNOSIS — N186 End stage renal disease: Secondary | ICD-10-CM | POA: Diagnosis not present

## 2017-02-09 DIAGNOSIS — H9311 Tinnitus, right ear: Secondary | ICD-10-CM | POA: Diagnosis not present

## 2017-02-10 DIAGNOSIS — N186 End stage renal disease: Secondary | ICD-10-CM | POA: Diagnosis not present

## 2017-02-10 DIAGNOSIS — E876 Hypokalemia: Secondary | ICD-10-CM | POA: Diagnosis not present

## 2017-02-10 DIAGNOSIS — N2581 Secondary hyperparathyroidism of renal origin: Secondary | ICD-10-CM | POA: Diagnosis not present

## 2017-02-10 DIAGNOSIS — E1121 Type 2 diabetes mellitus with diabetic nephropathy: Secondary | ICD-10-CM | POA: Diagnosis not present

## 2017-02-12 DIAGNOSIS — N2581 Secondary hyperparathyroidism of renal origin: Secondary | ICD-10-CM | POA: Diagnosis not present

## 2017-02-12 DIAGNOSIS — E1121 Type 2 diabetes mellitus with diabetic nephropathy: Secondary | ICD-10-CM | POA: Diagnosis not present

## 2017-02-12 DIAGNOSIS — N186 End stage renal disease: Secondary | ICD-10-CM | POA: Diagnosis not present

## 2017-02-12 DIAGNOSIS — E876 Hypokalemia: Secondary | ICD-10-CM | POA: Diagnosis not present

## 2017-02-14 DIAGNOSIS — N186 End stage renal disease: Secondary | ICD-10-CM | POA: Diagnosis not present

## 2017-02-14 DIAGNOSIS — E1121 Type 2 diabetes mellitus with diabetic nephropathy: Secondary | ICD-10-CM | POA: Diagnosis not present

## 2017-02-14 DIAGNOSIS — N2581 Secondary hyperparathyroidism of renal origin: Secondary | ICD-10-CM | POA: Diagnosis not present

## 2017-02-14 DIAGNOSIS — E876 Hypokalemia: Secondary | ICD-10-CM | POA: Diagnosis not present

## 2017-02-17 DIAGNOSIS — N2581 Secondary hyperparathyroidism of renal origin: Secondary | ICD-10-CM | POA: Diagnosis not present

## 2017-02-17 DIAGNOSIS — N186 End stage renal disease: Secondary | ICD-10-CM | POA: Diagnosis not present

## 2017-02-17 DIAGNOSIS — E1121 Type 2 diabetes mellitus with diabetic nephropathy: Secondary | ICD-10-CM | POA: Diagnosis not present

## 2017-02-17 DIAGNOSIS — E876 Hypokalemia: Secondary | ICD-10-CM | POA: Diagnosis not present

## 2017-02-19 DIAGNOSIS — N186 End stage renal disease: Secondary | ICD-10-CM | POA: Diagnosis not present

## 2017-02-19 DIAGNOSIS — E876 Hypokalemia: Secondary | ICD-10-CM | POA: Diagnosis not present

## 2017-02-19 DIAGNOSIS — N2581 Secondary hyperparathyroidism of renal origin: Secondary | ICD-10-CM | POA: Diagnosis not present

## 2017-02-19 DIAGNOSIS — E1121 Type 2 diabetes mellitus with diabetic nephropathy: Secondary | ICD-10-CM | POA: Diagnosis not present

## 2017-02-21 DIAGNOSIS — N186 End stage renal disease: Secondary | ICD-10-CM | POA: Diagnosis not present

## 2017-02-21 DIAGNOSIS — Z992 Dependence on renal dialysis: Secondary | ICD-10-CM | POA: Diagnosis not present

## 2017-02-21 DIAGNOSIS — E876 Hypokalemia: Secondary | ICD-10-CM | POA: Diagnosis not present

## 2017-02-21 DIAGNOSIS — N2581 Secondary hyperparathyroidism of renal origin: Secondary | ICD-10-CM | POA: Diagnosis not present

## 2017-02-21 DIAGNOSIS — E1122 Type 2 diabetes mellitus with diabetic chronic kidney disease: Secondary | ICD-10-CM | POA: Diagnosis not present

## 2017-02-21 DIAGNOSIS — E1121 Type 2 diabetes mellitus with diabetic nephropathy: Secondary | ICD-10-CM | POA: Diagnosis not present

## 2017-02-24 DIAGNOSIS — E1121 Type 2 diabetes mellitus with diabetic nephropathy: Secondary | ICD-10-CM | POA: Diagnosis not present

## 2017-02-24 DIAGNOSIS — E876 Hypokalemia: Secondary | ICD-10-CM | POA: Diagnosis not present

## 2017-02-24 DIAGNOSIS — N2581 Secondary hyperparathyroidism of renal origin: Secondary | ICD-10-CM | POA: Diagnosis not present

## 2017-02-24 DIAGNOSIS — N186 End stage renal disease: Secondary | ICD-10-CM | POA: Diagnosis not present

## 2017-02-26 DIAGNOSIS — E1121 Type 2 diabetes mellitus with diabetic nephropathy: Secondary | ICD-10-CM | POA: Diagnosis not present

## 2017-02-26 DIAGNOSIS — N2581 Secondary hyperparathyroidism of renal origin: Secondary | ICD-10-CM | POA: Diagnosis not present

## 2017-02-26 DIAGNOSIS — E876 Hypokalemia: Secondary | ICD-10-CM | POA: Diagnosis not present

## 2017-02-26 DIAGNOSIS — N186 End stage renal disease: Secondary | ICD-10-CM | POA: Diagnosis not present

## 2017-02-28 DIAGNOSIS — N186 End stage renal disease: Secondary | ICD-10-CM | POA: Diagnosis not present

## 2017-02-28 DIAGNOSIS — E1121 Type 2 diabetes mellitus with diabetic nephropathy: Secondary | ICD-10-CM | POA: Diagnosis not present

## 2017-02-28 DIAGNOSIS — N2581 Secondary hyperparathyroidism of renal origin: Secondary | ICD-10-CM | POA: Diagnosis not present

## 2017-02-28 DIAGNOSIS — E876 Hypokalemia: Secondary | ICD-10-CM | POA: Diagnosis not present

## 2017-03-03 DIAGNOSIS — N186 End stage renal disease: Secondary | ICD-10-CM | POA: Diagnosis not present

## 2017-03-03 DIAGNOSIS — N2581 Secondary hyperparathyroidism of renal origin: Secondary | ICD-10-CM | POA: Diagnosis not present

## 2017-03-03 DIAGNOSIS — E1121 Type 2 diabetes mellitus with diabetic nephropathy: Secondary | ICD-10-CM | POA: Diagnosis not present

## 2017-03-03 DIAGNOSIS — E876 Hypokalemia: Secondary | ICD-10-CM | POA: Diagnosis not present

## 2017-03-05 DIAGNOSIS — E1121 Type 2 diabetes mellitus with diabetic nephropathy: Secondary | ICD-10-CM | POA: Diagnosis not present

## 2017-03-05 DIAGNOSIS — N2581 Secondary hyperparathyroidism of renal origin: Secondary | ICD-10-CM | POA: Diagnosis not present

## 2017-03-05 DIAGNOSIS — N186 End stage renal disease: Secondary | ICD-10-CM | POA: Diagnosis not present

## 2017-03-05 DIAGNOSIS — E876 Hypokalemia: Secondary | ICD-10-CM | POA: Diagnosis not present

## 2017-03-07 DIAGNOSIS — N2581 Secondary hyperparathyroidism of renal origin: Secondary | ICD-10-CM | POA: Diagnosis not present

## 2017-03-07 DIAGNOSIS — E876 Hypokalemia: Secondary | ICD-10-CM | POA: Diagnosis not present

## 2017-03-07 DIAGNOSIS — E1121 Type 2 diabetes mellitus with diabetic nephropathy: Secondary | ICD-10-CM | POA: Diagnosis not present

## 2017-03-07 DIAGNOSIS — N186 End stage renal disease: Secondary | ICD-10-CM | POA: Diagnosis not present

## 2017-03-10 DIAGNOSIS — N186 End stage renal disease: Secondary | ICD-10-CM | POA: Diagnosis not present

## 2017-03-10 DIAGNOSIS — N2581 Secondary hyperparathyroidism of renal origin: Secondary | ICD-10-CM | POA: Diagnosis not present

## 2017-03-10 DIAGNOSIS — E876 Hypokalemia: Secondary | ICD-10-CM | POA: Diagnosis not present

## 2017-03-10 DIAGNOSIS — E1121 Type 2 diabetes mellitus with diabetic nephropathy: Secondary | ICD-10-CM | POA: Diagnosis not present

## 2017-03-12 DIAGNOSIS — E876 Hypokalemia: Secondary | ICD-10-CM | POA: Diagnosis not present

## 2017-03-12 DIAGNOSIS — E1121 Type 2 diabetes mellitus with diabetic nephropathy: Secondary | ICD-10-CM | POA: Diagnosis not present

## 2017-03-12 DIAGNOSIS — N2581 Secondary hyperparathyroidism of renal origin: Secondary | ICD-10-CM | POA: Diagnosis not present

## 2017-03-12 DIAGNOSIS — N186 End stage renal disease: Secondary | ICD-10-CM | POA: Diagnosis not present

## 2017-03-14 DIAGNOSIS — E1121 Type 2 diabetes mellitus with diabetic nephropathy: Secondary | ICD-10-CM | POA: Diagnosis not present

## 2017-03-14 DIAGNOSIS — N2581 Secondary hyperparathyroidism of renal origin: Secondary | ICD-10-CM | POA: Diagnosis not present

## 2017-03-14 DIAGNOSIS — E876 Hypokalemia: Secondary | ICD-10-CM | POA: Diagnosis not present

## 2017-03-14 DIAGNOSIS — N186 End stage renal disease: Secondary | ICD-10-CM | POA: Diagnosis not present

## 2017-03-17 DIAGNOSIS — N186 End stage renal disease: Secondary | ICD-10-CM | POA: Diagnosis not present

## 2017-03-17 DIAGNOSIS — E876 Hypokalemia: Secondary | ICD-10-CM | POA: Diagnosis not present

## 2017-03-17 DIAGNOSIS — E1121 Type 2 diabetes mellitus with diabetic nephropathy: Secondary | ICD-10-CM | POA: Diagnosis not present

## 2017-03-17 DIAGNOSIS — N2581 Secondary hyperparathyroidism of renal origin: Secondary | ICD-10-CM | POA: Diagnosis not present

## 2017-03-19 DIAGNOSIS — E1121 Type 2 diabetes mellitus with diabetic nephropathy: Secondary | ICD-10-CM | POA: Diagnosis not present

## 2017-03-19 DIAGNOSIS — N186 End stage renal disease: Secondary | ICD-10-CM | POA: Diagnosis not present

## 2017-03-19 DIAGNOSIS — E876 Hypokalemia: Secondary | ICD-10-CM | POA: Diagnosis not present

## 2017-03-19 DIAGNOSIS — N2581 Secondary hyperparathyroidism of renal origin: Secondary | ICD-10-CM | POA: Diagnosis not present

## 2017-03-21 DIAGNOSIS — E876 Hypokalemia: Secondary | ICD-10-CM | POA: Diagnosis not present

## 2017-03-21 DIAGNOSIS — E1121 Type 2 diabetes mellitus with diabetic nephropathy: Secondary | ICD-10-CM | POA: Diagnosis not present

## 2017-03-21 DIAGNOSIS — N2581 Secondary hyperparathyroidism of renal origin: Secondary | ICD-10-CM | POA: Diagnosis not present

## 2017-03-21 DIAGNOSIS — N186 End stage renal disease: Secondary | ICD-10-CM | POA: Diagnosis not present

## 2017-03-23 DIAGNOSIS — N186 End stage renal disease: Secondary | ICD-10-CM | POA: Diagnosis not present

## 2017-03-23 DIAGNOSIS — M5126 Other intervertebral disc displacement, lumbar region: Secondary | ICD-10-CM | POA: Diagnosis not present

## 2017-03-23 DIAGNOSIS — E1122 Type 2 diabetes mellitus with diabetic chronic kidney disease: Secondary | ICD-10-CM | POA: Diagnosis not present

## 2017-03-23 DIAGNOSIS — Z992 Dependence on renal dialysis: Secondary | ICD-10-CM | POA: Diagnosis not present

## 2017-03-24 DIAGNOSIS — E1121 Type 2 diabetes mellitus with diabetic nephropathy: Secondary | ICD-10-CM | POA: Diagnosis not present

## 2017-03-24 DIAGNOSIS — N186 End stage renal disease: Secondary | ICD-10-CM | POA: Diagnosis not present

## 2017-03-24 DIAGNOSIS — N2581 Secondary hyperparathyroidism of renal origin: Secondary | ICD-10-CM | POA: Diagnosis not present

## 2017-03-26 DIAGNOSIS — N2581 Secondary hyperparathyroidism of renal origin: Secondary | ICD-10-CM | POA: Diagnosis not present

## 2017-03-26 DIAGNOSIS — E1121 Type 2 diabetes mellitus with diabetic nephropathy: Secondary | ICD-10-CM | POA: Diagnosis not present

## 2017-03-26 DIAGNOSIS — N186 End stage renal disease: Secondary | ICD-10-CM | POA: Diagnosis not present

## 2017-03-28 DIAGNOSIS — N186 End stage renal disease: Secondary | ICD-10-CM | POA: Diagnosis not present

## 2017-03-28 DIAGNOSIS — N2581 Secondary hyperparathyroidism of renal origin: Secondary | ICD-10-CM | POA: Diagnosis not present

## 2017-03-28 DIAGNOSIS — E1121 Type 2 diabetes mellitus with diabetic nephropathy: Secondary | ICD-10-CM | POA: Diagnosis not present

## 2017-03-31 DIAGNOSIS — N186 End stage renal disease: Secondary | ICD-10-CM | POA: Diagnosis not present

## 2017-03-31 DIAGNOSIS — N2581 Secondary hyperparathyroidism of renal origin: Secondary | ICD-10-CM | POA: Diagnosis not present

## 2017-03-31 DIAGNOSIS — E1121 Type 2 diabetes mellitus with diabetic nephropathy: Secondary | ICD-10-CM | POA: Diagnosis not present

## 2017-04-02 DIAGNOSIS — E1121 Type 2 diabetes mellitus with diabetic nephropathy: Secondary | ICD-10-CM | POA: Diagnosis not present

## 2017-04-02 DIAGNOSIS — N186 End stage renal disease: Secondary | ICD-10-CM | POA: Diagnosis not present

## 2017-04-02 DIAGNOSIS — N2581 Secondary hyperparathyroidism of renal origin: Secondary | ICD-10-CM | POA: Diagnosis not present

## 2017-04-04 DIAGNOSIS — E1121 Type 2 diabetes mellitus with diabetic nephropathy: Secondary | ICD-10-CM | POA: Diagnosis not present

## 2017-04-04 DIAGNOSIS — N186 End stage renal disease: Secondary | ICD-10-CM | POA: Diagnosis not present

## 2017-04-04 DIAGNOSIS — N2581 Secondary hyperparathyroidism of renal origin: Secondary | ICD-10-CM | POA: Diagnosis not present

## 2017-04-07 DIAGNOSIS — E1121 Type 2 diabetes mellitus with diabetic nephropathy: Secondary | ICD-10-CM | POA: Diagnosis not present

## 2017-04-07 DIAGNOSIS — N2581 Secondary hyperparathyroidism of renal origin: Secondary | ICD-10-CM | POA: Diagnosis not present

## 2017-04-07 DIAGNOSIS — N186 End stage renal disease: Secondary | ICD-10-CM | POA: Diagnosis not present

## 2017-04-09 DIAGNOSIS — N2581 Secondary hyperparathyroidism of renal origin: Secondary | ICD-10-CM | POA: Diagnosis not present

## 2017-04-09 DIAGNOSIS — N186 End stage renal disease: Secondary | ICD-10-CM | POA: Diagnosis not present

## 2017-04-09 DIAGNOSIS — E1121 Type 2 diabetes mellitus with diabetic nephropathy: Secondary | ICD-10-CM | POA: Diagnosis not present

## 2017-04-11 DIAGNOSIS — E1121 Type 2 diabetes mellitus with diabetic nephropathy: Secondary | ICD-10-CM | POA: Diagnosis not present

## 2017-04-11 DIAGNOSIS — N2581 Secondary hyperparathyroidism of renal origin: Secondary | ICD-10-CM | POA: Diagnosis not present

## 2017-04-11 DIAGNOSIS — N186 End stage renal disease: Secondary | ICD-10-CM | POA: Diagnosis not present

## 2017-04-14 DIAGNOSIS — N186 End stage renal disease: Secondary | ICD-10-CM | POA: Diagnosis not present

## 2017-04-14 DIAGNOSIS — N2581 Secondary hyperparathyroidism of renal origin: Secondary | ICD-10-CM | POA: Diagnosis not present

## 2017-04-14 DIAGNOSIS — E1121 Type 2 diabetes mellitus with diabetic nephropathy: Secondary | ICD-10-CM | POA: Diagnosis not present

## 2017-04-16 DIAGNOSIS — N2581 Secondary hyperparathyroidism of renal origin: Secondary | ICD-10-CM | POA: Diagnosis not present

## 2017-04-16 DIAGNOSIS — N186 End stage renal disease: Secondary | ICD-10-CM | POA: Diagnosis not present

## 2017-04-16 DIAGNOSIS — E1121 Type 2 diabetes mellitus with diabetic nephropathy: Secondary | ICD-10-CM | POA: Diagnosis not present

## 2017-04-18 DIAGNOSIS — E1121 Type 2 diabetes mellitus with diabetic nephropathy: Secondary | ICD-10-CM | POA: Diagnosis not present

## 2017-04-18 DIAGNOSIS — N2581 Secondary hyperparathyroidism of renal origin: Secondary | ICD-10-CM | POA: Diagnosis not present

## 2017-04-18 DIAGNOSIS — N186 End stage renal disease: Secondary | ICD-10-CM | POA: Diagnosis not present

## 2017-04-21 DIAGNOSIS — N186 End stage renal disease: Secondary | ICD-10-CM | POA: Diagnosis not present

## 2017-04-21 DIAGNOSIS — E1121 Type 2 diabetes mellitus with diabetic nephropathy: Secondary | ICD-10-CM | POA: Diagnosis not present

## 2017-04-21 DIAGNOSIS — N2581 Secondary hyperparathyroidism of renal origin: Secondary | ICD-10-CM | POA: Diagnosis not present

## 2017-04-23 DIAGNOSIS — N186 End stage renal disease: Secondary | ICD-10-CM | POA: Diagnosis not present

## 2017-04-23 DIAGNOSIS — Z992 Dependence on renal dialysis: Secondary | ICD-10-CM | POA: Diagnosis not present

## 2017-04-23 DIAGNOSIS — N2581 Secondary hyperparathyroidism of renal origin: Secondary | ICD-10-CM | POA: Diagnosis not present

## 2017-04-23 DIAGNOSIS — E1122 Type 2 diabetes mellitus with diabetic chronic kidney disease: Secondary | ICD-10-CM | POA: Diagnosis not present

## 2017-04-23 DIAGNOSIS — E1121 Type 2 diabetes mellitus with diabetic nephropathy: Secondary | ICD-10-CM | POA: Diagnosis not present

## 2017-04-24 DIAGNOSIS — M5126 Other intervertebral disc displacement, lumbar region: Secondary | ICD-10-CM | POA: Diagnosis not present

## 2017-04-24 DIAGNOSIS — I10 Essential (primary) hypertension: Secondary | ICD-10-CM | POA: Diagnosis not present

## 2017-04-24 DIAGNOSIS — Z6837 Body mass index (BMI) 37.0-37.9, adult: Secondary | ICD-10-CM | POA: Diagnosis not present

## 2017-04-24 DIAGNOSIS — M4726 Other spondylosis with radiculopathy, lumbar region: Secondary | ICD-10-CM | POA: Diagnosis not present

## 2017-04-25 DIAGNOSIS — E1121 Type 2 diabetes mellitus with diabetic nephropathy: Secondary | ICD-10-CM | POA: Diagnosis not present

## 2017-04-25 DIAGNOSIS — N2581 Secondary hyperparathyroidism of renal origin: Secondary | ICD-10-CM | POA: Diagnosis not present

## 2017-04-25 DIAGNOSIS — N186 End stage renal disease: Secondary | ICD-10-CM | POA: Diagnosis not present

## 2017-04-28 DIAGNOSIS — E1121 Type 2 diabetes mellitus with diabetic nephropathy: Secondary | ICD-10-CM | POA: Diagnosis not present

## 2017-04-28 DIAGNOSIS — N2581 Secondary hyperparathyroidism of renal origin: Secondary | ICD-10-CM | POA: Diagnosis not present

## 2017-04-28 DIAGNOSIS — N186 End stage renal disease: Secondary | ICD-10-CM | POA: Diagnosis not present

## 2017-04-30 DIAGNOSIS — E1121 Type 2 diabetes mellitus with diabetic nephropathy: Secondary | ICD-10-CM | POA: Diagnosis not present

## 2017-04-30 DIAGNOSIS — N2581 Secondary hyperparathyroidism of renal origin: Secondary | ICD-10-CM | POA: Diagnosis not present

## 2017-04-30 DIAGNOSIS — N186 End stage renal disease: Secondary | ICD-10-CM | POA: Diagnosis not present

## 2017-05-02 DIAGNOSIS — N186 End stage renal disease: Secondary | ICD-10-CM | POA: Diagnosis not present

## 2017-05-02 DIAGNOSIS — E1121 Type 2 diabetes mellitus with diabetic nephropathy: Secondary | ICD-10-CM | POA: Diagnosis not present

## 2017-05-02 DIAGNOSIS — N2581 Secondary hyperparathyroidism of renal origin: Secondary | ICD-10-CM | POA: Diagnosis not present

## 2017-05-05 DIAGNOSIS — E1121 Type 2 diabetes mellitus with diabetic nephropathy: Secondary | ICD-10-CM | POA: Diagnosis not present

## 2017-05-05 DIAGNOSIS — N2581 Secondary hyperparathyroidism of renal origin: Secondary | ICD-10-CM | POA: Diagnosis not present

## 2017-05-05 DIAGNOSIS — N186 End stage renal disease: Secondary | ICD-10-CM | POA: Diagnosis not present

## 2017-05-07 DIAGNOSIS — N2581 Secondary hyperparathyroidism of renal origin: Secondary | ICD-10-CM | POA: Diagnosis not present

## 2017-05-07 DIAGNOSIS — E1121 Type 2 diabetes mellitus with diabetic nephropathy: Secondary | ICD-10-CM | POA: Diagnosis not present

## 2017-05-07 DIAGNOSIS — N186 End stage renal disease: Secondary | ICD-10-CM | POA: Diagnosis not present

## 2017-05-09 DIAGNOSIS — E1121 Type 2 diabetes mellitus with diabetic nephropathy: Secondary | ICD-10-CM | POA: Diagnosis not present

## 2017-05-09 DIAGNOSIS — N186 End stage renal disease: Secondary | ICD-10-CM | POA: Diagnosis not present

## 2017-05-09 DIAGNOSIS — N2581 Secondary hyperparathyroidism of renal origin: Secondary | ICD-10-CM | POA: Diagnosis not present

## 2017-05-12 DIAGNOSIS — N2581 Secondary hyperparathyroidism of renal origin: Secondary | ICD-10-CM | POA: Diagnosis not present

## 2017-05-12 DIAGNOSIS — N186 End stage renal disease: Secondary | ICD-10-CM | POA: Diagnosis not present

## 2017-05-12 DIAGNOSIS — E1121 Type 2 diabetes mellitus with diabetic nephropathy: Secondary | ICD-10-CM | POA: Diagnosis not present

## 2017-05-14 DIAGNOSIS — N2581 Secondary hyperparathyroidism of renal origin: Secondary | ICD-10-CM | POA: Diagnosis not present

## 2017-05-14 DIAGNOSIS — N186 End stage renal disease: Secondary | ICD-10-CM | POA: Diagnosis not present

## 2017-05-14 DIAGNOSIS — E1121 Type 2 diabetes mellitus with diabetic nephropathy: Secondary | ICD-10-CM | POA: Diagnosis not present

## 2017-05-16 DIAGNOSIS — N2581 Secondary hyperparathyroidism of renal origin: Secondary | ICD-10-CM | POA: Diagnosis not present

## 2017-05-16 DIAGNOSIS — N186 End stage renal disease: Secondary | ICD-10-CM | POA: Diagnosis not present

## 2017-05-16 DIAGNOSIS — E1121 Type 2 diabetes mellitus with diabetic nephropathy: Secondary | ICD-10-CM | POA: Diagnosis not present

## 2017-05-19 DIAGNOSIS — N2581 Secondary hyperparathyroidism of renal origin: Secondary | ICD-10-CM | POA: Diagnosis not present

## 2017-05-19 DIAGNOSIS — E1121 Type 2 diabetes mellitus with diabetic nephropathy: Secondary | ICD-10-CM | POA: Diagnosis not present

## 2017-05-19 DIAGNOSIS — N186 End stage renal disease: Secondary | ICD-10-CM | POA: Diagnosis not present

## 2017-05-21 DIAGNOSIS — N186 End stage renal disease: Secondary | ICD-10-CM | POA: Diagnosis not present

## 2017-05-21 DIAGNOSIS — N2581 Secondary hyperparathyroidism of renal origin: Secondary | ICD-10-CM | POA: Diagnosis not present

## 2017-05-21 DIAGNOSIS — E1121 Type 2 diabetes mellitus with diabetic nephropathy: Secondary | ICD-10-CM | POA: Diagnosis not present

## 2017-05-22 DIAGNOSIS — M4726 Other spondylosis with radiculopathy, lumbar region: Secondary | ICD-10-CM | POA: Diagnosis not present

## 2017-05-22 DIAGNOSIS — Z6837 Body mass index (BMI) 37.0-37.9, adult: Secondary | ICD-10-CM | POA: Diagnosis not present

## 2017-05-22 DIAGNOSIS — I10 Essential (primary) hypertension: Secondary | ICD-10-CM | POA: Diagnosis not present

## 2017-05-22 DIAGNOSIS — M5126 Other intervertebral disc displacement, lumbar region: Secondary | ICD-10-CM | POA: Diagnosis not present

## 2017-05-23 DIAGNOSIS — Z992 Dependence on renal dialysis: Secondary | ICD-10-CM | POA: Diagnosis not present

## 2017-05-23 DIAGNOSIS — N186 End stage renal disease: Secondary | ICD-10-CM | POA: Diagnosis not present

## 2017-05-23 DIAGNOSIS — E1121 Type 2 diabetes mellitus with diabetic nephropathy: Secondary | ICD-10-CM | POA: Diagnosis not present

## 2017-05-23 DIAGNOSIS — E1122 Type 2 diabetes mellitus with diabetic chronic kidney disease: Secondary | ICD-10-CM | POA: Diagnosis not present

## 2017-05-23 DIAGNOSIS — N2581 Secondary hyperparathyroidism of renal origin: Secondary | ICD-10-CM | POA: Diagnosis not present

## 2017-05-26 DIAGNOSIS — E1121 Type 2 diabetes mellitus with diabetic nephropathy: Secondary | ICD-10-CM | POA: Diagnosis not present

## 2017-05-26 DIAGNOSIS — N186 End stage renal disease: Secondary | ICD-10-CM | POA: Diagnosis not present

## 2017-05-26 DIAGNOSIS — N2581 Secondary hyperparathyroidism of renal origin: Secondary | ICD-10-CM | POA: Diagnosis not present

## 2017-05-28 DIAGNOSIS — E1121 Type 2 diabetes mellitus with diabetic nephropathy: Secondary | ICD-10-CM | POA: Diagnosis not present

## 2017-05-28 DIAGNOSIS — N2581 Secondary hyperparathyroidism of renal origin: Secondary | ICD-10-CM | POA: Diagnosis not present

## 2017-05-28 DIAGNOSIS — N186 End stage renal disease: Secondary | ICD-10-CM | POA: Diagnosis not present

## 2017-05-30 DIAGNOSIS — N2581 Secondary hyperparathyroidism of renal origin: Secondary | ICD-10-CM | POA: Diagnosis not present

## 2017-05-30 DIAGNOSIS — N186 End stage renal disease: Secondary | ICD-10-CM | POA: Diagnosis not present

## 2017-05-30 DIAGNOSIS — E1121 Type 2 diabetes mellitus with diabetic nephropathy: Secondary | ICD-10-CM | POA: Diagnosis not present

## 2017-06-02 DIAGNOSIS — E1121 Type 2 diabetes mellitus with diabetic nephropathy: Secondary | ICD-10-CM | POA: Diagnosis not present

## 2017-06-02 DIAGNOSIS — N2581 Secondary hyperparathyroidism of renal origin: Secondary | ICD-10-CM | POA: Diagnosis not present

## 2017-06-02 DIAGNOSIS — N186 End stage renal disease: Secondary | ICD-10-CM | POA: Diagnosis not present

## 2017-06-03 DIAGNOSIS — Z0000 Encounter for general adult medical examination without abnormal findings: Secondary | ICD-10-CM | POA: Diagnosis not present

## 2017-06-03 DIAGNOSIS — E1121 Type 2 diabetes mellitus with diabetic nephropathy: Secondary | ICD-10-CM | POA: Diagnosis not present

## 2017-06-03 DIAGNOSIS — E039 Hypothyroidism, unspecified: Secondary | ICD-10-CM | POA: Diagnosis not present

## 2017-06-03 DIAGNOSIS — Z125 Encounter for screening for malignant neoplasm of prostate: Secondary | ICD-10-CM | POA: Diagnosis not present

## 2017-06-03 DIAGNOSIS — N186 End stage renal disease: Secondary | ICD-10-CM | POA: Diagnosis not present

## 2017-06-03 DIAGNOSIS — E785 Hyperlipidemia, unspecified: Secondary | ICD-10-CM | POA: Diagnosis not present

## 2017-06-03 DIAGNOSIS — Z79899 Other long term (current) drug therapy: Secondary | ICD-10-CM | POA: Diagnosis not present

## 2017-06-04 DIAGNOSIS — N2581 Secondary hyperparathyroidism of renal origin: Secondary | ICD-10-CM | POA: Diagnosis not present

## 2017-06-04 DIAGNOSIS — E1121 Type 2 diabetes mellitus with diabetic nephropathy: Secondary | ICD-10-CM | POA: Diagnosis not present

## 2017-06-04 DIAGNOSIS — N186 End stage renal disease: Secondary | ICD-10-CM | POA: Diagnosis not present

## 2017-06-05 DIAGNOSIS — H31011 Macula scars of posterior pole (postinflammatory) (post-traumatic), right eye: Secondary | ICD-10-CM | POA: Diagnosis not present

## 2017-06-05 DIAGNOSIS — E113593 Type 2 diabetes mellitus with proliferative diabetic retinopathy without macular edema, bilateral: Secondary | ICD-10-CM | POA: Diagnosis not present

## 2017-06-05 DIAGNOSIS — H3582 Retinal ischemia: Secondary | ICD-10-CM | POA: Diagnosis not present

## 2017-06-06 DIAGNOSIS — N2581 Secondary hyperparathyroidism of renal origin: Secondary | ICD-10-CM | POA: Diagnosis not present

## 2017-06-06 DIAGNOSIS — E1121 Type 2 diabetes mellitus with diabetic nephropathy: Secondary | ICD-10-CM | POA: Diagnosis not present

## 2017-06-06 DIAGNOSIS — N186 End stage renal disease: Secondary | ICD-10-CM | POA: Diagnosis not present

## 2017-06-09 DIAGNOSIS — N2581 Secondary hyperparathyroidism of renal origin: Secondary | ICD-10-CM | POA: Diagnosis not present

## 2017-06-09 DIAGNOSIS — N186 End stage renal disease: Secondary | ICD-10-CM | POA: Diagnosis not present

## 2017-06-09 DIAGNOSIS — E1121 Type 2 diabetes mellitus with diabetic nephropathy: Secondary | ICD-10-CM | POA: Diagnosis not present

## 2017-06-11 DIAGNOSIS — N2581 Secondary hyperparathyroidism of renal origin: Secondary | ICD-10-CM | POA: Diagnosis not present

## 2017-06-11 DIAGNOSIS — N186 End stage renal disease: Secondary | ICD-10-CM | POA: Diagnosis not present

## 2017-06-11 DIAGNOSIS — E1121 Type 2 diabetes mellitus with diabetic nephropathy: Secondary | ICD-10-CM | POA: Diagnosis not present

## 2017-06-13 DIAGNOSIS — N186 End stage renal disease: Secondary | ICD-10-CM | POA: Diagnosis not present

## 2017-06-13 DIAGNOSIS — N2581 Secondary hyperparathyroidism of renal origin: Secondary | ICD-10-CM | POA: Diagnosis not present

## 2017-06-13 DIAGNOSIS — E1121 Type 2 diabetes mellitus with diabetic nephropathy: Secondary | ICD-10-CM | POA: Diagnosis not present

## 2017-06-15 DIAGNOSIS — E1121 Type 2 diabetes mellitus with diabetic nephropathy: Secondary | ICD-10-CM | POA: Diagnosis not present

## 2017-06-15 DIAGNOSIS — M25572 Pain in left ankle and joints of left foot: Secondary | ICD-10-CM | POA: Diagnosis not present

## 2017-06-16 DIAGNOSIS — N2581 Secondary hyperparathyroidism of renal origin: Secondary | ICD-10-CM | POA: Diagnosis not present

## 2017-06-16 DIAGNOSIS — N186 End stage renal disease: Secondary | ICD-10-CM | POA: Diagnosis not present

## 2017-06-16 DIAGNOSIS — E1121 Type 2 diabetes mellitus with diabetic nephropathy: Secondary | ICD-10-CM | POA: Diagnosis not present

## 2017-06-18 DIAGNOSIS — E1121 Type 2 diabetes mellitus with diabetic nephropathy: Secondary | ICD-10-CM | POA: Diagnosis not present

## 2017-06-18 DIAGNOSIS — N186 End stage renal disease: Secondary | ICD-10-CM | POA: Diagnosis not present

## 2017-06-18 DIAGNOSIS — N2581 Secondary hyperparathyroidism of renal origin: Secondary | ICD-10-CM | POA: Diagnosis not present

## 2017-06-20 DIAGNOSIS — E1121 Type 2 diabetes mellitus with diabetic nephropathy: Secondary | ICD-10-CM | POA: Diagnosis not present

## 2017-06-20 DIAGNOSIS — N186 End stage renal disease: Secondary | ICD-10-CM | POA: Diagnosis not present

## 2017-06-20 DIAGNOSIS — N2581 Secondary hyperparathyroidism of renal origin: Secondary | ICD-10-CM | POA: Diagnosis not present

## 2017-06-23 DIAGNOSIS — E1121 Type 2 diabetes mellitus with diabetic nephropathy: Secondary | ICD-10-CM | POA: Diagnosis not present

## 2017-06-23 DIAGNOSIS — E1122 Type 2 diabetes mellitus with diabetic chronic kidney disease: Secondary | ICD-10-CM | POA: Diagnosis not present

## 2017-06-23 DIAGNOSIS — N2581 Secondary hyperparathyroidism of renal origin: Secondary | ICD-10-CM | POA: Diagnosis not present

## 2017-06-23 DIAGNOSIS — N186 End stage renal disease: Secondary | ICD-10-CM | POA: Diagnosis not present

## 2017-06-23 DIAGNOSIS — Z992 Dependence on renal dialysis: Secondary | ICD-10-CM | POA: Diagnosis not present

## 2017-06-25 DIAGNOSIS — N2581 Secondary hyperparathyroidism of renal origin: Secondary | ICD-10-CM | POA: Diagnosis not present

## 2017-06-25 DIAGNOSIS — N186 End stage renal disease: Secondary | ICD-10-CM | POA: Diagnosis not present

## 2017-06-25 DIAGNOSIS — E1121 Type 2 diabetes mellitus with diabetic nephropathy: Secondary | ICD-10-CM | POA: Diagnosis not present

## 2017-06-27 DIAGNOSIS — N186 End stage renal disease: Secondary | ICD-10-CM | POA: Diagnosis not present

## 2017-06-27 DIAGNOSIS — E1121 Type 2 diabetes mellitus with diabetic nephropathy: Secondary | ICD-10-CM | POA: Diagnosis not present

## 2017-06-27 DIAGNOSIS — N2581 Secondary hyperparathyroidism of renal origin: Secondary | ICD-10-CM | POA: Diagnosis not present

## 2017-06-30 DIAGNOSIS — N2581 Secondary hyperparathyroidism of renal origin: Secondary | ICD-10-CM | POA: Diagnosis not present

## 2017-06-30 DIAGNOSIS — E1121 Type 2 diabetes mellitus with diabetic nephropathy: Secondary | ICD-10-CM | POA: Diagnosis not present

## 2017-06-30 DIAGNOSIS — N186 End stage renal disease: Secondary | ICD-10-CM | POA: Diagnosis not present

## 2017-07-02 DIAGNOSIS — N186 End stage renal disease: Secondary | ICD-10-CM | POA: Diagnosis not present

## 2017-07-02 DIAGNOSIS — E1121 Type 2 diabetes mellitus with diabetic nephropathy: Secondary | ICD-10-CM | POA: Diagnosis not present

## 2017-07-02 DIAGNOSIS — N2581 Secondary hyperparathyroidism of renal origin: Secondary | ICD-10-CM | POA: Diagnosis not present

## 2017-07-04 DIAGNOSIS — N2581 Secondary hyperparathyroidism of renal origin: Secondary | ICD-10-CM | POA: Diagnosis not present

## 2017-07-04 DIAGNOSIS — E1121 Type 2 diabetes mellitus with diabetic nephropathy: Secondary | ICD-10-CM | POA: Diagnosis not present

## 2017-07-04 DIAGNOSIS — N186 End stage renal disease: Secondary | ICD-10-CM | POA: Diagnosis not present

## 2017-07-07 DIAGNOSIS — E1121 Type 2 diabetes mellitus with diabetic nephropathy: Secondary | ICD-10-CM | POA: Diagnosis not present

## 2017-07-07 DIAGNOSIS — N2581 Secondary hyperparathyroidism of renal origin: Secondary | ICD-10-CM | POA: Diagnosis not present

## 2017-07-07 DIAGNOSIS — N186 End stage renal disease: Secondary | ICD-10-CM | POA: Diagnosis not present

## 2017-07-09 DIAGNOSIS — E1121 Type 2 diabetes mellitus with diabetic nephropathy: Secondary | ICD-10-CM | POA: Diagnosis not present

## 2017-07-09 DIAGNOSIS — N2581 Secondary hyperparathyroidism of renal origin: Secondary | ICD-10-CM | POA: Diagnosis not present

## 2017-07-09 DIAGNOSIS — N186 End stage renal disease: Secondary | ICD-10-CM | POA: Diagnosis not present

## 2017-07-11 DIAGNOSIS — N2581 Secondary hyperparathyroidism of renal origin: Secondary | ICD-10-CM | POA: Diagnosis not present

## 2017-07-11 DIAGNOSIS — E1121 Type 2 diabetes mellitus with diabetic nephropathy: Secondary | ICD-10-CM | POA: Diagnosis not present

## 2017-07-11 DIAGNOSIS — N186 End stage renal disease: Secondary | ICD-10-CM | POA: Diagnosis not present

## 2017-07-14 DIAGNOSIS — N2581 Secondary hyperparathyroidism of renal origin: Secondary | ICD-10-CM | POA: Diagnosis not present

## 2017-07-14 DIAGNOSIS — N186 End stage renal disease: Secondary | ICD-10-CM | POA: Diagnosis not present

## 2017-07-14 DIAGNOSIS — E1121 Type 2 diabetes mellitus with diabetic nephropathy: Secondary | ICD-10-CM | POA: Diagnosis not present

## 2017-07-16 DIAGNOSIS — N2581 Secondary hyperparathyroidism of renal origin: Secondary | ICD-10-CM | POA: Diagnosis not present

## 2017-07-16 DIAGNOSIS — N186 End stage renal disease: Secondary | ICD-10-CM | POA: Diagnosis not present

## 2017-07-16 DIAGNOSIS — E1121 Type 2 diabetes mellitus with diabetic nephropathy: Secondary | ICD-10-CM | POA: Diagnosis not present

## 2017-07-18 DIAGNOSIS — N186 End stage renal disease: Secondary | ICD-10-CM | POA: Diagnosis not present

## 2017-07-18 DIAGNOSIS — E1121 Type 2 diabetes mellitus with diabetic nephropathy: Secondary | ICD-10-CM | POA: Diagnosis not present

## 2017-07-18 DIAGNOSIS — N2581 Secondary hyperparathyroidism of renal origin: Secondary | ICD-10-CM | POA: Diagnosis not present

## 2017-07-21 DIAGNOSIS — E1121 Type 2 diabetes mellitus with diabetic nephropathy: Secondary | ICD-10-CM | POA: Diagnosis not present

## 2017-07-21 DIAGNOSIS — N2581 Secondary hyperparathyroidism of renal origin: Secondary | ICD-10-CM | POA: Diagnosis not present

## 2017-07-21 DIAGNOSIS — N186 End stage renal disease: Secondary | ICD-10-CM | POA: Diagnosis not present

## 2017-07-23 DIAGNOSIS — N2581 Secondary hyperparathyroidism of renal origin: Secondary | ICD-10-CM | POA: Diagnosis not present

## 2017-07-23 DIAGNOSIS — E1121 Type 2 diabetes mellitus with diabetic nephropathy: Secondary | ICD-10-CM | POA: Diagnosis not present

## 2017-07-23 DIAGNOSIS — N186 End stage renal disease: Secondary | ICD-10-CM | POA: Diagnosis not present

## 2017-07-24 DIAGNOSIS — N186 End stage renal disease: Secondary | ICD-10-CM | POA: Diagnosis not present

## 2017-07-24 DIAGNOSIS — Z992 Dependence on renal dialysis: Secondary | ICD-10-CM | POA: Diagnosis not present

## 2017-07-24 DIAGNOSIS — E1122 Type 2 diabetes mellitus with diabetic chronic kidney disease: Secondary | ICD-10-CM | POA: Diagnosis not present

## 2017-07-25 DIAGNOSIS — N186 End stage renal disease: Secondary | ICD-10-CM | POA: Diagnosis not present

## 2017-07-25 DIAGNOSIS — E1121 Type 2 diabetes mellitus with diabetic nephropathy: Secondary | ICD-10-CM | POA: Diagnosis not present

## 2017-07-25 DIAGNOSIS — N2581 Secondary hyperparathyroidism of renal origin: Secondary | ICD-10-CM | POA: Diagnosis not present

## 2017-07-28 DIAGNOSIS — E1121 Type 2 diabetes mellitus with diabetic nephropathy: Secondary | ICD-10-CM | POA: Diagnosis not present

## 2017-07-28 DIAGNOSIS — N186 End stage renal disease: Secondary | ICD-10-CM | POA: Diagnosis not present

## 2017-07-28 DIAGNOSIS — N2581 Secondary hyperparathyroidism of renal origin: Secondary | ICD-10-CM | POA: Diagnosis not present

## 2017-07-30 DIAGNOSIS — E1121 Type 2 diabetes mellitus with diabetic nephropathy: Secondary | ICD-10-CM | POA: Diagnosis not present

## 2017-07-30 DIAGNOSIS — N2581 Secondary hyperparathyroidism of renal origin: Secondary | ICD-10-CM | POA: Diagnosis not present

## 2017-07-30 DIAGNOSIS — N186 End stage renal disease: Secondary | ICD-10-CM | POA: Diagnosis not present

## 2017-08-01 DIAGNOSIS — N186 End stage renal disease: Secondary | ICD-10-CM | POA: Diagnosis not present

## 2017-08-01 DIAGNOSIS — N2581 Secondary hyperparathyroidism of renal origin: Secondary | ICD-10-CM | POA: Diagnosis not present

## 2017-08-01 DIAGNOSIS — E1121 Type 2 diabetes mellitus with diabetic nephropathy: Secondary | ICD-10-CM | POA: Diagnosis not present

## 2017-08-04 DIAGNOSIS — N2581 Secondary hyperparathyroidism of renal origin: Secondary | ICD-10-CM | POA: Diagnosis not present

## 2017-08-04 DIAGNOSIS — N186 End stage renal disease: Secondary | ICD-10-CM | POA: Diagnosis not present

## 2017-08-04 DIAGNOSIS — E1121 Type 2 diabetes mellitus with diabetic nephropathy: Secondary | ICD-10-CM | POA: Diagnosis not present

## 2017-08-06 DIAGNOSIS — N186 End stage renal disease: Secondary | ICD-10-CM | POA: Diagnosis not present

## 2017-08-06 DIAGNOSIS — E1121 Type 2 diabetes mellitus with diabetic nephropathy: Secondary | ICD-10-CM | POA: Diagnosis not present

## 2017-08-06 DIAGNOSIS — N2581 Secondary hyperparathyroidism of renal origin: Secondary | ICD-10-CM | POA: Diagnosis not present

## 2017-08-08 DIAGNOSIS — E1121 Type 2 diabetes mellitus with diabetic nephropathy: Secondary | ICD-10-CM | POA: Diagnosis not present

## 2017-08-08 DIAGNOSIS — N186 End stage renal disease: Secondary | ICD-10-CM | POA: Diagnosis not present

## 2017-08-08 DIAGNOSIS — N2581 Secondary hyperparathyroidism of renal origin: Secondary | ICD-10-CM | POA: Diagnosis not present

## 2017-08-11 DIAGNOSIS — E1121 Type 2 diabetes mellitus with diabetic nephropathy: Secondary | ICD-10-CM | POA: Diagnosis not present

## 2017-08-11 DIAGNOSIS — N2581 Secondary hyperparathyroidism of renal origin: Secondary | ICD-10-CM | POA: Diagnosis not present

## 2017-08-11 DIAGNOSIS — N186 End stage renal disease: Secondary | ICD-10-CM | POA: Diagnosis not present

## 2017-08-13 DIAGNOSIS — N186 End stage renal disease: Secondary | ICD-10-CM | POA: Diagnosis not present

## 2017-08-13 DIAGNOSIS — N2581 Secondary hyperparathyroidism of renal origin: Secondary | ICD-10-CM | POA: Diagnosis not present

## 2017-08-13 DIAGNOSIS — E1121 Type 2 diabetes mellitus with diabetic nephropathy: Secondary | ICD-10-CM | POA: Diagnosis not present

## 2017-08-15 DIAGNOSIS — N186 End stage renal disease: Secondary | ICD-10-CM | POA: Diagnosis not present

## 2017-08-15 DIAGNOSIS — E1121 Type 2 diabetes mellitus with diabetic nephropathy: Secondary | ICD-10-CM | POA: Diagnosis not present

## 2017-08-15 DIAGNOSIS — N2581 Secondary hyperparathyroidism of renal origin: Secondary | ICD-10-CM | POA: Diagnosis not present

## 2017-08-18 DIAGNOSIS — N2581 Secondary hyperparathyroidism of renal origin: Secondary | ICD-10-CM | POA: Diagnosis not present

## 2017-08-18 DIAGNOSIS — N186 End stage renal disease: Secondary | ICD-10-CM | POA: Diagnosis not present

## 2017-08-18 DIAGNOSIS — E1121 Type 2 diabetes mellitus with diabetic nephropathy: Secondary | ICD-10-CM | POA: Diagnosis not present

## 2017-08-20 DIAGNOSIS — N186 End stage renal disease: Secondary | ICD-10-CM | POA: Diagnosis not present

## 2017-08-20 DIAGNOSIS — N2581 Secondary hyperparathyroidism of renal origin: Secondary | ICD-10-CM | POA: Diagnosis not present

## 2017-08-20 DIAGNOSIS — E1121 Type 2 diabetes mellitus with diabetic nephropathy: Secondary | ICD-10-CM | POA: Diagnosis not present

## 2017-08-21 DIAGNOSIS — M25561 Pain in right knee: Secondary | ICD-10-CM | POA: Diagnosis not present

## 2017-08-21 DIAGNOSIS — M5126 Other intervertebral disc displacement, lumbar region: Secondary | ICD-10-CM | POA: Diagnosis not present

## 2017-08-21 DIAGNOSIS — Z6838 Body mass index (BMI) 38.0-38.9, adult: Secondary | ICD-10-CM | POA: Diagnosis not present

## 2017-08-21 DIAGNOSIS — I10 Essential (primary) hypertension: Secondary | ICD-10-CM | POA: Diagnosis not present

## 2017-08-21 DIAGNOSIS — M4726 Other spondylosis with radiculopathy, lumbar region: Secondary | ICD-10-CM | POA: Diagnosis not present

## 2017-08-22 DIAGNOSIS — N2581 Secondary hyperparathyroidism of renal origin: Secondary | ICD-10-CM | POA: Diagnosis not present

## 2017-08-22 DIAGNOSIS — E1121 Type 2 diabetes mellitus with diabetic nephropathy: Secondary | ICD-10-CM | POA: Diagnosis not present

## 2017-08-22 DIAGNOSIS — N186 End stage renal disease: Secondary | ICD-10-CM | POA: Diagnosis not present

## 2017-08-23 DIAGNOSIS — N186 End stage renal disease: Secondary | ICD-10-CM | POA: Diagnosis not present

## 2017-08-23 DIAGNOSIS — Z992 Dependence on renal dialysis: Secondary | ICD-10-CM | POA: Diagnosis not present

## 2017-08-23 DIAGNOSIS — E1122 Type 2 diabetes mellitus with diabetic chronic kidney disease: Secondary | ICD-10-CM | POA: Diagnosis not present

## 2017-08-25 DIAGNOSIS — N186 End stage renal disease: Secondary | ICD-10-CM | POA: Diagnosis not present

## 2017-08-25 DIAGNOSIS — N2581 Secondary hyperparathyroidism of renal origin: Secondary | ICD-10-CM | POA: Diagnosis not present

## 2017-08-25 DIAGNOSIS — E1121 Type 2 diabetes mellitus with diabetic nephropathy: Secondary | ICD-10-CM | POA: Diagnosis not present

## 2017-08-25 DIAGNOSIS — Z23 Encounter for immunization: Secondary | ICD-10-CM | POA: Diagnosis not present

## 2017-08-27 DIAGNOSIS — Z23 Encounter for immunization: Secondary | ICD-10-CM | POA: Diagnosis not present

## 2017-08-27 DIAGNOSIS — N186 End stage renal disease: Secondary | ICD-10-CM | POA: Diagnosis not present

## 2017-08-27 DIAGNOSIS — E1121 Type 2 diabetes mellitus with diabetic nephropathy: Secondary | ICD-10-CM | POA: Diagnosis not present

## 2017-08-27 DIAGNOSIS — N2581 Secondary hyperparathyroidism of renal origin: Secondary | ICD-10-CM | POA: Diagnosis not present

## 2017-08-28 DIAGNOSIS — M1711 Unilateral primary osteoarthritis, right knee: Secondary | ICD-10-CM | POA: Diagnosis not present

## 2017-08-29 DIAGNOSIS — Z23 Encounter for immunization: Secondary | ICD-10-CM | POA: Diagnosis not present

## 2017-08-29 DIAGNOSIS — E1121 Type 2 diabetes mellitus with diabetic nephropathy: Secondary | ICD-10-CM | POA: Diagnosis not present

## 2017-08-29 DIAGNOSIS — N186 End stage renal disease: Secondary | ICD-10-CM | POA: Diagnosis not present

## 2017-08-29 DIAGNOSIS — N2581 Secondary hyperparathyroidism of renal origin: Secondary | ICD-10-CM | POA: Diagnosis not present

## 2017-09-01 DIAGNOSIS — N186 End stage renal disease: Secondary | ICD-10-CM | POA: Diagnosis not present

## 2017-09-01 DIAGNOSIS — E1121 Type 2 diabetes mellitus with diabetic nephropathy: Secondary | ICD-10-CM | POA: Diagnosis not present

## 2017-09-01 DIAGNOSIS — Z23 Encounter for immunization: Secondary | ICD-10-CM | POA: Diagnosis not present

## 2017-09-01 DIAGNOSIS — N2581 Secondary hyperparathyroidism of renal origin: Secondary | ICD-10-CM | POA: Diagnosis not present

## 2017-09-01 DIAGNOSIS — J324 Chronic pansinusitis: Secondary | ICD-10-CM | POA: Diagnosis not present

## 2017-09-03 DIAGNOSIS — E1121 Type 2 diabetes mellitus with diabetic nephropathy: Secondary | ICD-10-CM | POA: Diagnosis not present

## 2017-09-03 DIAGNOSIS — Z23 Encounter for immunization: Secondary | ICD-10-CM | POA: Diagnosis not present

## 2017-09-03 DIAGNOSIS — N186 End stage renal disease: Secondary | ICD-10-CM | POA: Diagnosis not present

## 2017-09-03 DIAGNOSIS — N2581 Secondary hyperparathyroidism of renal origin: Secondary | ICD-10-CM | POA: Diagnosis not present

## 2017-09-04 DIAGNOSIS — M5416 Radiculopathy, lumbar region: Secondary | ICD-10-CM | POA: Diagnosis not present

## 2017-09-05 DIAGNOSIS — E1121 Type 2 diabetes mellitus with diabetic nephropathy: Secondary | ICD-10-CM | POA: Diagnosis not present

## 2017-09-05 DIAGNOSIS — Z23 Encounter for immunization: Secondary | ICD-10-CM | POA: Diagnosis not present

## 2017-09-05 DIAGNOSIS — N186 End stage renal disease: Secondary | ICD-10-CM | POA: Diagnosis not present

## 2017-09-05 DIAGNOSIS — N2581 Secondary hyperparathyroidism of renal origin: Secondary | ICD-10-CM | POA: Diagnosis not present

## 2017-09-07 DIAGNOSIS — M1711 Unilateral primary osteoarthritis, right knee: Secondary | ICD-10-CM | POA: Diagnosis not present

## 2017-09-08 DIAGNOSIS — Z23 Encounter for immunization: Secondary | ICD-10-CM | POA: Diagnosis not present

## 2017-09-08 DIAGNOSIS — N186 End stage renal disease: Secondary | ICD-10-CM | POA: Diagnosis not present

## 2017-09-08 DIAGNOSIS — E1121 Type 2 diabetes mellitus with diabetic nephropathy: Secondary | ICD-10-CM | POA: Diagnosis not present

## 2017-09-08 DIAGNOSIS — N2581 Secondary hyperparathyroidism of renal origin: Secondary | ICD-10-CM | POA: Diagnosis not present

## 2017-09-10 DIAGNOSIS — N186 End stage renal disease: Secondary | ICD-10-CM | POA: Diagnosis not present

## 2017-09-10 DIAGNOSIS — N2581 Secondary hyperparathyroidism of renal origin: Secondary | ICD-10-CM | POA: Diagnosis not present

## 2017-09-10 DIAGNOSIS — E1121 Type 2 diabetes mellitus with diabetic nephropathy: Secondary | ICD-10-CM | POA: Diagnosis not present

## 2017-09-10 DIAGNOSIS — Z23 Encounter for immunization: Secondary | ICD-10-CM | POA: Diagnosis not present

## 2017-09-11 DIAGNOSIS — M5416 Radiculopathy, lumbar region: Secondary | ICD-10-CM | POA: Diagnosis not present

## 2017-09-11 DIAGNOSIS — M5126 Other intervertebral disc displacement, lumbar region: Secondary | ICD-10-CM | POA: Diagnosis not present

## 2017-09-12 DIAGNOSIS — N2581 Secondary hyperparathyroidism of renal origin: Secondary | ICD-10-CM | POA: Diagnosis not present

## 2017-09-12 DIAGNOSIS — E1121 Type 2 diabetes mellitus with diabetic nephropathy: Secondary | ICD-10-CM | POA: Diagnosis not present

## 2017-09-12 DIAGNOSIS — N186 End stage renal disease: Secondary | ICD-10-CM | POA: Diagnosis not present

## 2017-09-12 DIAGNOSIS — Z23 Encounter for immunization: Secondary | ICD-10-CM | POA: Diagnosis not present

## 2017-09-13 DIAGNOSIS — S91115A Laceration without foreign body of left lesser toe(s) without damage to nail, initial encounter: Secondary | ICD-10-CM | POA: Diagnosis not present

## 2017-09-14 DIAGNOSIS — N186 End stage renal disease: Secondary | ICD-10-CM | POA: Diagnosis not present

## 2017-09-14 DIAGNOSIS — I447 Left bundle-branch block, unspecified: Secondary | ICD-10-CM | POA: Diagnosis not present

## 2017-09-14 DIAGNOSIS — I2510 Atherosclerotic heart disease of native coronary artery without angina pectoris: Secondary | ICD-10-CM | POA: Diagnosis not present

## 2017-09-14 DIAGNOSIS — Z992 Dependence on renal dialysis: Secondary | ICD-10-CM | POA: Diagnosis not present

## 2017-09-14 DIAGNOSIS — I5032 Chronic diastolic (congestive) heart failure: Secondary | ICD-10-CM | POA: Diagnosis not present

## 2017-09-14 DIAGNOSIS — Z794 Long term (current) use of insulin: Secondary | ICD-10-CM | POA: Diagnosis not present

## 2017-09-14 DIAGNOSIS — Z7982 Long term (current) use of aspirin: Secondary | ICD-10-CM | POA: Diagnosis not present

## 2017-09-14 DIAGNOSIS — E119 Type 2 diabetes mellitus without complications: Secondary | ICD-10-CM | POA: Diagnosis not present

## 2017-09-15 DIAGNOSIS — Z23 Encounter for immunization: Secondary | ICD-10-CM | POA: Diagnosis not present

## 2017-09-15 DIAGNOSIS — N2581 Secondary hyperparathyroidism of renal origin: Secondary | ICD-10-CM | POA: Diagnosis not present

## 2017-09-15 DIAGNOSIS — N186 End stage renal disease: Secondary | ICD-10-CM | POA: Diagnosis not present

## 2017-09-15 DIAGNOSIS — E1121 Type 2 diabetes mellitus with diabetic nephropathy: Secondary | ICD-10-CM | POA: Diagnosis not present

## 2017-09-16 DIAGNOSIS — E1121 Type 2 diabetes mellitus with diabetic nephropathy: Secondary | ICD-10-CM | POA: Diagnosis not present

## 2017-09-16 DIAGNOSIS — Z23 Encounter for immunization: Secondary | ICD-10-CM | POA: Diagnosis not present

## 2017-09-16 DIAGNOSIS — Z6838 Body mass index (BMI) 38.0-38.9, adult: Secondary | ICD-10-CM | POA: Diagnosis not present

## 2017-09-16 DIAGNOSIS — S91119A Laceration without foreign body of unspecified toe without damage to nail, initial encounter: Secondary | ICD-10-CM | POA: Diagnosis not present

## 2017-09-17 DIAGNOSIS — N2581 Secondary hyperparathyroidism of renal origin: Secondary | ICD-10-CM | POA: Diagnosis not present

## 2017-09-17 DIAGNOSIS — N186 End stage renal disease: Secondary | ICD-10-CM | POA: Diagnosis not present

## 2017-09-17 DIAGNOSIS — Z23 Encounter for immunization: Secondary | ICD-10-CM | POA: Diagnosis not present

## 2017-09-17 DIAGNOSIS — E1121 Type 2 diabetes mellitus with diabetic nephropathy: Secondary | ICD-10-CM | POA: Diagnosis not present

## 2017-09-19 DIAGNOSIS — E1121 Type 2 diabetes mellitus with diabetic nephropathy: Secondary | ICD-10-CM | POA: Diagnosis not present

## 2017-09-19 DIAGNOSIS — N186 End stage renal disease: Secondary | ICD-10-CM | POA: Diagnosis not present

## 2017-09-19 DIAGNOSIS — N2581 Secondary hyperparathyroidism of renal origin: Secondary | ICD-10-CM | POA: Diagnosis not present

## 2017-09-19 DIAGNOSIS — Z23 Encounter for immunization: Secondary | ICD-10-CM | POA: Diagnosis not present

## 2017-09-22 DIAGNOSIS — E1121 Type 2 diabetes mellitus with diabetic nephropathy: Secondary | ICD-10-CM | POA: Diagnosis not present

## 2017-09-22 DIAGNOSIS — N186 End stage renal disease: Secondary | ICD-10-CM | POA: Diagnosis not present

## 2017-09-22 DIAGNOSIS — Z23 Encounter for immunization: Secondary | ICD-10-CM | POA: Diagnosis not present

## 2017-09-22 DIAGNOSIS — N2581 Secondary hyperparathyroidism of renal origin: Secondary | ICD-10-CM | POA: Diagnosis not present

## 2017-09-23 DIAGNOSIS — N186 End stage renal disease: Secondary | ICD-10-CM | POA: Diagnosis not present

## 2017-09-23 DIAGNOSIS — Z992 Dependence on renal dialysis: Secondary | ICD-10-CM | POA: Diagnosis not present

## 2017-09-23 DIAGNOSIS — M21612 Bunion of left foot: Secondary | ICD-10-CM | POA: Diagnosis not present

## 2017-09-23 DIAGNOSIS — M21611 Bunion of right foot: Secondary | ICD-10-CM | POA: Diagnosis not present

## 2017-09-23 DIAGNOSIS — E1142 Type 2 diabetes mellitus with diabetic polyneuropathy: Secondary | ICD-10-CM | POA: Diagnosis not present

## 2017-09-23 DIAGNOSIS — E1122 Type 2 diabetes mellitus with diabetic chronic kidney disease: Secondary | ICD-10-CM | POA: Diagnosis not present

## 2017-09-24 DIAGNOSIS — N2581 Secondary hyperparathyroidism of renal origin: Secondary | ICD-10-CM | POA: Diagnosis not present

## 2017-09-24 DIAGNOSIS — D631 Anemia in chronic kidney disease: Secondary | ICD-10-CM | POA: Diagnosis not present

## 2017-09-24 DIAGNOSIS — R509 Fever, unspecified: Secondary | ICD-10-CM | POA: Diagnosis not present

## 2017-09-24 DIAGNOSIS — E1121 Type 2 diabetes mellitus with diabetic nephropathy: Secondary | ICD-10-CM | POA: Diagnosis not present

## 2017-09-24 DIAGNOSIS — N186 End stage renal disease: Secondary | ICD-10-CM | POA: Diagnosis not present

## 2017-09-25 DIAGNOSIS — M4726 Other spondylosis with radiculopathy, lumbar region: Secondary | ICD-10-CM | POA: Diagnosis not present

## 2017-09-25 DIAGNOSIS — Z6837 Body mass index (BMI) 37.0-37.9, adult: Secondary | ICD-10-CM | POA: Diagnosis not present

## 2017-09-26 DIAGNOSIS — E1121 Type 2 diabetes mellitus with diabetic nephropathy: Secondary | ICD-10-CM | POA: Diagnosis not present

## 2017-09-26 DIAGNOSIS — R509 Fever, unspecified: Secondary | ICD-10-CM | POA: Diagnosis not present

## 2017-09-26 DIAGNOSIS — N186 End stage renal disease: Secondary | ICD-10-CM | POA: Diagnosis not present

## 2017-09-26 DIAGNOSIS — N2581 Secondary hyperparathyroidism of renal origin: Secondary | ICD-10-CM | POA: Diagnosis not present

## 2017-09-26 DIAGNOSIS — D631 Anemia in chronic kidney disease: Secondary | ICD-10-CM | POA: Diagnosis not present

## 2017-09-28 DIAGNOSIS — S91115D Laceration without foreign body of left lesser toe(s) without damage to nail, subsequent encounter: Secondary | ICD-10-CM | POA: Diagnosis not present

## 2017-09-28 DIAGNOSIS — Z6839 Body mass index (BMI) 39.0-39.9, adult: Secondary | ICD-10-CM | POA: Diagnosis not present

## 2017-09-29 DIAGNOSIS — E1121 Type 2 diabetes mellitus with diabetic nephropathy: Secondary | ICD-10-CM | POA: Diagnosis not present

## 2017-09-29 DIAGNOSIS — R509 Fever, unspecified: Secondary | ICD-10-CM | POA: Diagnosis not present

## 2017-09-29 DIAGNOSIS — N186 End stage renal disease: Secondary | ICD-10-CM | POA: Diagnosis not present

## 2017-09-29 DIAGNOSIS — D631 Anemia in chronic kidney disease: Secondary | ICD-10-CM | POA: Diagnosis not present

## 2017-09-29 DIAGNOSIS — N2581 Secondary hyperparathyroidism of renal origin: Secondary | ICD-10-CM | POA: Diagnosis not present

## 2017-10-01 DIAGNOSIS — N2581 Secondary hyperparathyroidism of renal origin: Secondary | ICD-10-CM | POA: Diagnosis not present

## 2017-10-01 DIAGNOSIS — E1121 Type 2 diabetes mellitus with diabetic nephropathy: Secondary | ICD-10-CM | POA: Diagnosis not present

## 2017-10-01 DIAGNOSIS — D631 Anemia in chronic kidney disease: Secondary | ICD-10-CM | POA: Diagnosis not present

## 2017-10-01 DIAGNOSIS — R509 Fever, unspecified: Secondary | ICD-10-CM | POA: Diagnosis not present

## 2017-10-01 DIAGNOSIS — N186 End stage renal disease: Secondary | ICD-10-CM | POA: Diagnosis not present

## 2017-10-03 DIAGNOSIS — N186 End stage renal disease: Secondary | ICD-10-CM | POA: Diagnosis not present

## 2017-10-03 DIAGNOSIS — N2581 Secondary hyperparathyroidism of renal origin: Secondary | ICD-10-CM | POA: Diagnosis not present

## 2017-10-03 DIAGNOSIS — D631 Anemia in chronic kidney disease: Secondary | ICD-10-CM | POA: Diagnosis not present

## 2017-10-03 DIAGNOSIS — E1121 Type 2 diabetes mellitus with diabetic nephropathy: Secondary | ICD-10-CM | POA: Diagnosis not present

## 2017-10-03 DIAGNOSIS — R509 Fever, unspecified: Secondary | ICD-10-CM | POA: Diagnosis not present

## 2017-10-05 ENCOUNTER — Encounter (HOSPITAL_COMMUNITY): Payer: Self-pay

## 2017-10-05 ENCOUNTER — Emergency Department (HOSPITAL_COMMUNITY)
Admission: EM | Admit: 2017-10-05 | Discharge: 2017-10-05 | Disposition: A | Discharge status: Home / Self Care | Payer: No Typology Code available for payment source | Attending: Emergency Medicine | Admitting: Emergency Medicine

## 2017-10-05 ENCOUNTER — Emergency Department (HOSPITAL_COMMUNITY): Payer: No Typology Code available for payment source

## 2017-10-05 ENCOUNTER — Other Ambulatory Visit: Payer: Self-pay

## 2017-10-05 DIAGNOSIS — Z7982 Long term (current) use of aspirin: Secondary | ICD-10-CM | POA: Insufficient documentation

## 2017-10-05 DIAGNOSIS — S79911A Unspecified injury of right hip, initial encounter: Secondary | ICD-10-CM | POA: Diagnosis not present

## 2017-10-05 DIAGNOSIS — E119 Type 2 diabetes mellitus without complications: Secondary | ICD-10-CM | POA: Diagnosis not present

## 2017-10-05 DIAGNOSIS — M25572 Pain in left ankle and joints of left foot: Secondary | ICD-10-CM | POA: Diagnosis not present

## 2017-10-05 DIAGNOSIS — S82142A Displaced bicondylar fracture of left tibia, initial encounter for closed fracture: Secondary | ICD-10-CM | POA: Diagnosis not present

## 2017-10-05 DIAGNOSIS — S82142D Displaced bicondylar fracture of left tibia, subsequent encounter for closed fracture with routine healing: Secondary | ICD-10-CM

## 2017-10-05 DIAGNOSIS — Z79899 Other long term (current) drug therapy: Secondary | ICD-10-CM | POA: Diagnosis not present

## 2017-10-05 DIAGNOSIS — R0602 Shortness of breath: Secondary | ICD-10-CM | POA: Diagnosis not present

## 2017-10-05 DIAGNOSIS — Z7401 Bed confinement status: Secondary | ICD-10-CM | POA: Diagnosis not present

## 2017-10-05 DIAGNOSIS — S8991XA Unspecified injury of right lower leg, initial encounter: Secondary | ICD-10-CM | POA: Diagnosis not present

## 2017-10-05 DIAGNOSIS — S82292D Other fracture of shaft of left tibia, subsequent encounter for closed fracture with routine healing: Secondary | ICD-10-CM | POA: Diagnosis not present

## 2017-10-05 DIAGNOSIS — R279 Unspecified lack of coordination: Secondary | ICD-10-CM | POA: Diagnosis not present

## 2017-10-05 DIAGNOSIS — Z794 Long term (current) use of insulin: Secondary | ICD-10-CM | POA: Insufficient documentation

## 2017-10-05 DIAGNOSIS — I10 Essential (primary) hypertension: Secondary | ICD-10-CM | POA: Insufficient documentation

## 2017-10-05 DIAGNOSIS — M25551 Pain in right hip: Secondary | ICD-10-CM | POA: Diagnosis not present

## 2017-10-05 DIAGNOSIS — S82141D Displaced bicondylar fracture of right tibia, subsequent encounter for closed fracture with routine healing: Secondary | ICD-10-CM | POA: Diagnosis not present

## 2017-10-05 LAB — CBC WITH DIFFERENTIAL/PLATELET
Basophils Absolute: 0 10*3/uL (ref 0.0–0.1)
Basophils Relative: 0 %
Eosinophils Absolute: 0.2 10*3/uL (ref 0.0–0.7)
Eosinophils Relative: 2 %
HCT: 45.3 % (ref 39.0–52.0)
Hemoglobin: 14.6 g/dL (ref 13.0–17.0)
Lymphocytes Relative: 18 %
Lymphs Abs: 1.4 10*3/uL (ref 0.7–4.0)
MCH: 31.7 pg (ref 26.0–34.0)
MCHC: 32.2 g/dL (ref 30.0–36.0)
MCV: 98.5 fL (ref 78.0–100.0)
Monocytes Absolute: 0.5 10*3/uL (ref 0.1–1.0)
Monocytes Relative: 7 %
Neutro Abs: 5.5 10*3/uL (ref 1.7–7.7)
Neutrophils Relative %: 73 %
Platelets: 159 10*3/uL (ref 150–400)
RBC: 4.6 MIL/uL (ref 4.22–5.81)
RDW: 14.9 % (ref 11.5–15.5)
WBC: 7.6 10*3/uL (ref 4.0–10.5)

## 2017-10-05 LAB — BASIC METABOLIC PANEL
Anion gap: 12 (ref 5–15)
BUN: 52 mg/dL — ABNORMAL HIGH (ref 6–20)
CO2: 27 mmol/L (ref 22–32)
Calcium: 8.9 mg/dL (ref 8.9–10.3)
Chloride: 99 mmol/L — ABNORMAL LOW (ref 101–111)
Creatinine, Ser: 7.74 mg/dL — ABNORMAL HIGH (ref 0.61–1.24)
GFR calc Af Amer: 7 mL/min — ABNORMAL LOW (ref >60)
GFR calc non Af Amer: 6 mL/min — ABNORMAL LOW (ref >60)
Glucose, Bld: 111 mg/dL — ABNORMAL HIGH (ref 65–99)
Potassium: 4.3 mmol/L (ref 3.5–5.1)
Sodium: 138 mmol/L (ref 135–145)

## 2017-10-05 MED ORDER — ONDANSETRON HCL 4 MG/2ML IJ SOLN
4.0000 mg | Freq: Once | INTRAMUSCULAR | Status: AC
  Administered 2017-10-05: 4 mg via INTRAVENOUS
  Filled 2017-10-05: qty 2

## 2017-10-05 MED ORDER — MORPHINE SULFATE (PF) 4 MG/ML IV SOLN
4.0000 mg | Freq: Once | INTRAVENOUS | Status: AC
  Administered 2017-10-05: 4 mg via INTRAVENOUS
  Filled 2017-10-05: qty 1

## 2017-10-05 NOTE — Discharge Instructions (Signed)
There were no admission criteria met today.  Please go to receive your dialysis, as scheduled, tomorrow morning. Please follow up with the orthopedic surgeon, as planned.

## 2017-10-05 NOTE — ED Notes (Signed)
Case management at bedside.
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
## 2017-10-06 DIAGNOSIS — E1121 Type 2 diabetes mellitus with diabetic nephropathy: Secondary | ICD-10-CM | POA: Diagnosis not present

## 2017-10-06 DIAGNOSIS — N2581 Secondary hyperparathyroidism of renal origin: Secondary | ICD-10-CM | POA: Diagnosis not present

## 2017-10-06 DIAGNOSIS — R509 Fever, unspecified: Secondary | ICD-10-CM | POA: Diagnosis not present

## 2017-10-06 DIAGNOSIS — N186 End stage renal disease: Secondary | ICD-10-CM | POA: Diagnosis not present

## 2017-10-06 DIAGNOSIS — D631 Anemia in chronic kidney disease: Secondary | ICD-10-CM | POA: Diagnosis not present

## 2017-10-07 DIAGNOSIS — Z794 Long term (current) use of insulin: Secondary | ICD-10-CM | POA: Diagnosis not present

## 2017-10-07 DIAGNOSIS — M25562 Pain in left knee: Secondary | ICD-10-CM | POA: Diagnosis not present

## 2017-10-07 DIAGNOSIS — D508 Other iron deficiency anemias: Secondary | ICD-10-CM | POA: Diagnosis not present

## 2017-10-07 DIAGNOSIS — G8918 Other acute postprocedural pain: Secondary | ICD-10-CM | POA: Diagnosis not present

## 2017-10-07 DIAGNOSIS — R262 Difficulty in walking, not elsewhere classified: Secondary | ICD-10-CM | POA: Diagnosis not present

## 2017-10-07 DIAGNOSIS — S82122A Displaced fracture of lateral condyle of left tibia, initial encounter for closed fracture: Secondary | ICD-10-CM | POA: Diagnosis not present

## 2017-10-07 DIAGNOSIS — Z7409 Other reduced mobility: Secondary | ICD-10-CM | POA: Diagnosis not present

## 2017-10-07 DIAGNOSIS — N186 End stage renal disease: Secondary | ICD-10-CM | POA: Diagnosis not present

## 2017-10-07 DIAGNOSIS — F330 Major depressive disorder, recurrent, mild: Secondary | ICD-10-CM | POA: Diagnosis not present

## 2017-10-07 DIAGNOSIS — R488 Other symbolic dysfunctions: Secondary | ICD-10-CM | POA: Diagnosis not present

## 2017-10-07 DIAGNOSIS — G8929 Other chronic pain: Secondary | ICD-10-CM | POA: Diagnosis present

## 2017-10-07 DIAGNOSIS — Z743 Need for continuous supervision: Secondary | ICD-10-CM | POA: Diagnosis not present

## 2017-10-07 DIAGNOSIS — S82102D Unspecified fracture of upper end of left tibia, subsequent encounter for closed fracture with routine healing: Secondary | ICD-10-CM | POA: Diagnosis not present

## 2017-10-07 DIAGNOSIS — R279 Unspecified lack of coordination: Secondary | ICD-10-CM | POA: Diagnosis not present

## 2017-10-07 DIAGNOSIS — S83282A Other tear of lateral meniscus, current injury, left knee, initial encounter: Secondary | ICD-10-CM | POA: Diagnosis not present

## 2017-10-07 DIAGNOSIS — E1151 Type 2 diabetes mellitus with diabetic peripheral angiopathy without gangrene: Secondary | ICD-10-CM | POA: Diagnosis present

## 2017-10-07 DIAGNOSIS — I517 Cardiomegaly: Secondary | ICD-10-CM | POA: Diagnosis not present

## 2017-10-07 DIAGNOSIS — E1122 Type 2 diabetes mellitus with diabetic chronic kidney disease: Secondary | ICD-10-CM | POA: Diagnosis not present

## 2017-10-07 DIAGNOSIS — Z6838 Body mass index (BMI) 38.0-38.9, adult: Secondary | ICD-10-CM | POA: Diagnosis not present

## 2017-10-07 DIAGNOSIS — Y92410 Unspecified street and highway as the place of occurrence of the external cause: Secondary | ICD-10-CM | POA: Diagnosis not present

## 2017-10-07 DIAGNOSIS — S82145A Nondisplaced bicondylar fracture of left tibia, initial encounter for closed fracture: Secondary | ICD-10-CM | POA: Diagnosis not present

## 2017-10-07 DIAGNOSIS — S82122D Displaced fracture of lateral condyle of left tibia, subsequent encounter for closed fracture with routine healing: Secondary | ICD-10-CM | POA: Diagnosis not present

## 2017-10-07 DIAGNOSIS — M549 Dorsalgia, unspecified: Secondary | ICD-10-CM | POA: Diagnosis not present

## 2017-10-07 DIAGNOSIS — Z87891 Personal history of nicotine dependence: Secondary | ICD-10-CM | POA: Diagnosis not present

## 2017-10-07 DIAGNOSIS — D631 Anemia in chronic kidney disease: Secondary | ICD-10-CM | POA: Diagnosis not present

## 2017-10-07 DIAGNOSIS — E1142 Type 2 diabetes mellitus with diabetic polyneuropathy: Secondary | ICD-10-CM | POA: Diagnosis not present

## 2017-10-07 DIAGNOSIS — E119 Type 2 diabetes mellitus without complications: Secondary | ICD-10-CM | POA: Diagnosis not present

## 2017-10-07 DIAGNOSIS — I132 Hypertensive heart and chronic kidney disease with heart failure and with stage 5 chronic kidney disease, or end stage renal disease: Secondary | ICD-10-CM | POA: Diagnosis not present

## 2017-10-07 DIAGNOSIS — M4800 Spinal stenosis, site unspecified: Secondary | ICD-10-CM | POA: Diagnosis present

## 2017-10-07 DIAGNOSIS — E669 Obesity, unspecified: Secondary | ICD-10-CM | POA: Diagnosis present

## 2017-10-07 DIAGNOSIS — E785 Hyperlipidemia, unspecified: Secondary | ICD-10-CM | POA: Diagnosis not present

## 2017-10-07 DIAGNOSIS — M6281 Muscle weakness (generalized): Secondary | ICD-10-CM | POA: Diagnosis not present

## 2017-10-07 DIAGNOSIS — I10 Essential (primary) hypertension: Secondary | ICD-10-CM | POA: Diagnosis not present

## 2017-10-07 DIAGNOSIS — I2510 Atherosclerotic heart disease of native coronary artery without angina pectoris: Secondary | ICD-10-CM | POA: Diagnosis not present

## 2017-10-07 DIAGNOSIS — S82142A Displaced bicondylar fracture of left tibia, initial encounter for closed fracture: Secondary | ICD-10-CM | POA: Diagnosis not present

## 2017-10-07 DIAGNOSIS — Z9181 History of falling: Secondary | ICD-10-CM | POA: Diagnosis not present

## 2017-10-07 DIAGNOSIS — G4733 Obstructive sleep apnea (adult) (pediatric): Secondary | ICD-10-CM | POA: Diagnosis not present

## 2017-10-07 DIAGNOSIS — I120 Hypertensive chronic kidney disease with stage 5 chronic kidney disease or end stage renal disease: Secondary | ICD-10-CM | POA: Diagnosis not present

## 2017-10-07 DIAGNOSIS — I5030 Unspecified diastolic (congestive) heart failure: Secondary | ICD-10-CM | POA: Diagnosis not present

## 2017-10-07 DIAGNOSIS — H9193 Unspecified hearing loss, bilateral: Secondary | ICD-10-CM | POA: Diagnosis present

## 2017-10-07 DIAGNOSIS — I440 Atrioventricular block, first degree: Secondary | ICD-10-CM | POA: Diagnosis not present

## 2017-10-07 DIAGNOSIS — R1310 Dysphagia, unspecified: Secondary | ICD-10-CM | POA: Diagnosis not present

## 2017-10-07 DIAGNOSIS — M545 Low back pain: Secondary | ICD-10-CM | POA: Diagnosis present

## 2017-10-07 DIAGNOSIS — Y999 Unspecified external cause status: Secondary | ICD-10-CM | POA: Diagnosis not present

## 2017-10-07 DIAGNOSIS — M85672 Other cyst of bone, left ankle and foot: Secondary | ICD-10-CM | POA: Diagnosis not present

## 2017-10-07 DIAGNOSIS — T82838A Hemorrhage of vascular prosthetic devices, implants and grafts, initial encounter: Secondary | ICD-10-CM | POA: Diagnosis not present

## 2017-10-07 DIAGNOSIS — J449 Chronic obstructive pulmonary disease, unspecified: Secondary | ICD-10-CM | POA: Diagnosis not present

## 2017-10-07 DIAGNOSIS — Z992 Dependence on renal dialysis: Secondary | ICD-10-CM | POA: Diagnosis not present

## 2017-10-07 DIAGNOSIS — I5032 Chronic diastolic (congestive) heart failure: Secondary | ICD-10-CM | POA: Diagnosis not present

## 2017-10-07 DIAGNOSIS — F329 Major depressive disorder, single episode, unspecified: Secondary | ICD-10-CM | POA: Diagnosis not present

## 2017-10-07 DIAGNOSIS — N2581 Secondary hyperparathyroidism of renal origin: Secondary | ICD-10-CM | POA: Diagnosis not present

## 2017-10-07 DIAGNOSIS — S838X2A Sprain of other specified parts of left knee, initial encounter: Secondary | ICD-10-CM | POA: Diagnosis present

## 2017-10-07 DIAGNOSIS — E1159 Type 2 diabetes mellitus with other circulatory complications: Secondary | ICD-10-CM | POA: Diagnosis not present

## 2017-10-07 DIAGNOSIS — S82202A Unspecified fracture of shaft of left tibia, initial encounter for closed fracture: Secondary | ICD-10-CM | POA: Diagnosis not present

## 2017-10-07 DIAGNOSIS — K5900 Constipation, unspecified: Secondary | ICD-10-CM | POA: Diagnosis not present

## 2017-10-07 DIAGNOSIS — E871 Hypo-osmolality and hyponatremia: Secondary | ICD-10-CM | POA: Diagnosis not present

## 2017-10-07 DIAGNOSIS — E039 Hypothyroidism, unspecified: Secondary | ICD-10-CM | POA: Diagnosis not present

## 2017-10-07 DIAGNOSIS — R52 Pain, unspecified: Secondary | ICD-10-CM | POA: Diagnosis not present

## 2017-10-14 DIAGNOSIS — R509 Fever, unspecified: Secondary | ICD-10-CM | POA: Diagnosis not present

## 2017-10-14 DIAGNOSIS — J849 Interstitial pulmonary disease, unspecified: Secondary | ICD-10-CM | POA: Diagnosis not present

## 2017-10-14 DIAGNOSIS — I120 Hypertensive chronic kidney disease with stage 5 chronic kidney disease or end stage renal disease: Secondary | ICD-10-CM | POA: Diagnosis not present

## 2017-10-14 DIAGNOSIS — Z794 Long term (current) use of insulin: Secondary | ICD-10-CM | POA: Diagnosis not present

## 2017-10-14 DIAGNOSIS — I132 Hypertensive heart and chronic kidney disease with heart failure and with stage 5 chronic kidney disease, or end stage renal disease: Secondary | ICD-10-CM | POA: Diagnosis not present

## 2017-10-14 DIAGNOSIS — N2581 Secondary hyperparathyroidism of renal origin: Secondary | ICD-10-CM | POA: Diagnosis not present

## 2017-10-14 DIAGNOSIS — E669 Obesity, unspecified: Secondary | ICD-10-CM | POA: Diagnosis not present

## 2017-10-14 DIAGNOSIS — Z7409 Other reduced mobility: Secondary | ICD-10-CM | POA: Diagnosis not present

## 2017-10-14 DIAGNOSIS — I517 Cardiomegaly: Secondary | ICD-10-CM | POA: Diagnosis not present

## 2017-10-14 DIAGNOSIS — R5381 Other malaise: Secondary | ICD-10-CM | POA: Diagnosis not present

## 2017-10-14 DIAGNOSIS — R05 Cough: Secondary | ICD-10-CM | POA: Diagnosis not present

## 2017-10-14 DIAGNOSIS — T82838A Hemorrhage of vascular prosthetic devices, implants and grafts, initial encounter: Secondary | ICD-10-CM | POA: Diagnosis not present

## 2017-10-14 DIAGNOSIS — S82142A Displaced bicondylar fracture of left tibia, initial encounter for closed fracture: Secondary | ICD-10-CM | POA: Diagnosis not present

## 2017-10-14 DIAGNOSIS — M65342 Trigger finger, left ring finger: Secondary | ICD-10-CM | POA: Diagnosis not present

## 2017-10-14 DIAGNOSIS — E1121 Type 2 diabetes mellitus with diabetic nephropathy: Secondary | ICD-10-CM | POA: Diagnosis not present

## 2017-10-14 DIAGNOSIS — N186 End stage renal disease: Secondary | ICD-10-CM | POA: Diagnosis not present

## 2017-10-14 DIAGNOSIS — E119 Type 2 diabetes mellitus without complications: Secondary | ICD-10-CM | POA: Diagnosis not present

## 2017-10-14 DIAGNOSIS — Z79891 Long term (current) use of opiate analgesic: Secondary | ICD-10-CM | POA: Diagnosis not present

## 2017-10-14 DIAGNOSIS — J449 Chronic obstructive pulmonary disease, unspecified: Secondary | ICD-10-CM | POA: Diagnosis not present

## 2017-10-14 DIAGNOSIS — E1142 Type 2 diabetes mellitus with diabetic polyneuropathy: Secondary | ICD-10-CM | POA: Diagnosis not present

## 2017-10-14 DIAGNOSIS — I2510 Atherosclerotic heart disease of native coronary artery without angina pectoris: Secondary | ICD-10-CM | POA: Diagnosis not present

## 2017-10-14 DIAGNOSIS — T82858A Stenosis of vascular prosthetic devices, implants and grafts, initial encounter: Secondary | ICD-10-CM | POA: Diagnosis not present

## 2017-10-14 DIAGNOSIS — Z4802 Encounter for removal of sutures: Secondary | ICD-10-CM | POA: Diagnosis not present

## 2017-10-14 DIAGNOSIS — F329 Major depressive disorder, single episode, unspecified: Secondary | ICD-10-CM | POA: Diagnosis not present

## 2017-10-14 DIAGNOSIS — S82102D Unspecified fracture of upper end of left tibia, subsequent encounter for closed fracture with routine healing: Secondary | ICD-10-CM | POA: Diagnosis not present

## 2017-10-14 DIAGNOSIS — S82112D Displaced fracture of left tibial spine, subsequent encounter for closed fracture with routine healing: Secondary | ICD-10-CM | POA: Diagnosis not present

## 2017-10-14 DIAGNOSIS — Z4789 Encounter for other orthopedic aftercare: Secondary | ICD-10-CM | POA: Diagnosis not present

## 2017-10-14 DIAGNOSIS — M25462 Effusion, left knee: Secondary | ICD-10-CM | POA: Diagnosis not present

## 2017-10-14 DIAGNOSIS — D631 Anemia in chronic kidney disease: Secondary | ICD-10-CM | POA: Diagnosis not present

## 2017-10-14 DIAGNOSIS — M79605 Pain in left leg: Secondary | ICD-10-CM | POA: Diagnosis not present

## 2017-10-14 DIAGNOSIS — I10 Essential (primary) hypertension: Secondary | ICD-10-CM | POA: Diagnosis not present

## 2017-10-14 DIAGNOSIS — I871 Compression of vein: Secondary | ICD-10-CM | POA: Diagnosis not present

## 2017-10-14 DIAGNOSIS — D509 Iron deficiency anemia, unspecified: Secondary | ICD-10-CM | POA: Diagnosis not present

## 2017-10-14 DIAGNOSIS — S82122D Displaced fracture of lateral condyle of left tibia, subsequent encounter for closed fracture with routine healing: Secondary | ICD-10-CM | POA: Diagnosis not present

## 2017-10-14 DIAGNOSIS — M1712 Unilateral primary osteoarthritis, left knee: Secondary | ICD-10-CM | POA: Diagnosis not present

## 2017-10-14 DIAGNOSIS — J3489 Other specified disorders of nose and nasal sinuses: Secondary | ICD-10-CM | POA: Diagnosis not present

## 2017-10-14 DIAGNOSIS — I5030 Unspecified diastolic (congestive) heart failure: Secondary | ICD-10-CM | POA: Diagnosis not present

## 2017-10-14 DIAGNOSIS — S838X2A Sprain of other specified parts of left knee, initial encounter: Secondary | ICD-10-CM | POA: Diagnosis not present

## 2017-10-14 DIAGNOSIS — H109 Unspecified conjunctivitis: Secondary | ICD-10-CM | POA: Diagnosis not present

## 2017-10-14 DIAGNOSIS — R262 Difficulty in walking, not elsewhere classified: Secondary | ICD-10-CM | POA: Diagnosis not present

## 2017-10-14 DIAGNOSIS — S82202A Unspecified fracture of shaft of left tibia, initial encounter for closed fracture: Secondary | ICD-10-CM | POA: Diagnosis not present

## 2017-10-14 DIAGNOSIS — I5032 Chronic diastolic (congestive) heart failure: Secondary | ICD-10-CM | POA: Diagnosis not present

## 2017-10-14 DIAGNOSIS — M6281 Muscle weakness (generalized): Secondary | ICD-10-CM | POA: Diagnosis not present

## 2017-10-14 DIAGNOSIS — G4733 Obstructive sleep apnea (adult) (pediatric): Secondary | ICD-10-CM | POA: Diagnosis not present

## 2017-10-14 DIAGNOSIS — E039 Hypothyroidism, unspecified: Secondary | ICD-10-CM | POA: Diagnosis not present

## 2017-10-14 DIAGNOSIS — E782 Mixed hyperlipidemia: Secondary | ICD-10-CM | POA: Diagnosis not present

## 2017-10-14 DIAGNOSIS — R488 Other symbolic dysfunctions: Secondary | ICD-10-CM | POA: Diagnosis not present

## 2017-10-14 DIAGNOSIS — F330 Major depressive disorder, recurrent, mild: Secondary | ICD-10-CM | POA: Diagnosis not present

## 2017-10-14 DIAGNOSIS — E785 Hyperlipidemia, unspecified: Secondary | ICD-10-CM | POA: Diagnosis not present

## 2017-10-14 DIAGNOSIS — Z992 Dependence on renal dialysis: Secondary | ICD-10-CM | POA: Diagnosis not present

## 2017-10-14 DIAGNOSIS — K5900 Constipation, unspecified: Secondary | ICD-10-CM | POA: Diagnosis not present

## 2017-10-14 DIAGNOSIS — R1310 Dysphagia, unspecified: Secondary | ICD-10-CM | POA: Diagnosis not present

## 2017-10-14 DIAGNOSIS — E871 Hypo-osmolality and hyponatremia: Secondary | ICD-10-CM | POA: Diagnosis not present

## 2017-10-14 DIAGNOSIS — R0689 Other abnormalities of breathing: Secondary | ICD-10-CM | POA: Diagnosis not present

## 2017-10-14 DIAGNOSIS — E1122 Type 2 diabetes mellitus with diabetic chronic kidney disease: Secondary | ICD-10-CM | POA: Diagnosis not present

## 2017-10-14 DIAGNOSIS — D508 Other iron deficiency anemias: Secondary | ICD-10-CM | POA: Diagnosis not present

## 2017-10-14 DIAGNOSIS — I7090 Unspecified atherosclerosis: Secondary | ICD-10-CM | POA: Diagnosis not present

## 2017-10-16 DIAGNOSIS — N186 End stage renal disease: Secondary | ICD-10-CM | POA: Diagnosis not present

## 2017-10-16 DIAGNOSIS — D631 Anemia in chronic kidney disease: Secondary | ICD-10-CM | POA: Diagnosis not present

## 2017-10-16 DIAGNOSIS — K5900 Constipation, unspecified: Secondary | ICD-10-CM | POA: Diagnosis not present

## 2017-10-16 DIAGNOSIS — N2581 Secondary hyperparathyroidism of renal origin: Secondary | ICD-10-CM | POA: Diagnosis not present

## 2017-10-16 DIAGNOSIS — S82122D Displaced fracture of lateral condyle of left tibia, subsequent encounter for closed fracture with routine healing: Secondary | ICD-10-CM | POA: Diagnosis not present

## 2017-10-16 DIAGNOSIS — R509 Fever, unspecified: Secondary | ICD-10-CM | POA: Diagnosis not present

## 2017-10-16 DIAGNOSIS — E1121 Type 2 diabetes mellitus with diabetic nephropathy: Secondary | ICD-10-CM | POA: Diagnosis not present

## 2017-10-16 DIAGNOSIS — E119 Type 2 diabetes mellitus without complications: Secondary | ICD-10-CM | POA: Diagnosis not present

## 2017-10-17 DIAGNOSIS — I10 Essential (primary) hypertension: Secondary | ICD-10-CM | POA: Diagnosis not present

## 2017-10-17 DIAGNOSIS — E119 Type 2 diabetes mellitus without complications: Secondary | ICD-10-CM | POA: Diagnosis not present

## 2017-10-17 DIAGNOSIS — E039 Hypothyroidism, unspecified: Secondary | ICD-10-CM | POA: Diagnosis not present

## 2017-10-17 DIAGNOSIS — E782 Mixed hyperlipidemia: Secondary | ICD-10-CM | POA: Diagnosis not present

## 2017-10-19 DIAGNOSIS — N186 End stage renal disease: Secondary | ICD-10-CM | POA: Diagnosis not present

## 2017-10-19 DIAGNOSIS — D631 Anemia in chronic kidney disease: Secondary | ICD-10-CM | POA: Diagnosis not present

## 2017-10-19 DIAGNOSIS — N2581 Secondary hyperparathyroidism of renal origin: Secondary | ICD-10-CM | POA: Diagnosis not present

## 2017-10-19 DIAGNOSIS — R509 Fever, unspecified: Secondary | ICD-10-CM | POA: Diagnosis not present

## 2017-10-19 DIAGNOSIS — E1121 Type 2 diabetes mellitus with diabetic nephropathy: Secondary | ICD-10-CM | POA: Diagnosis not present

## 2017-10-23 DIAGNOSIS — E1121 Type 2 diabetes mellitus with diabetic nephropathy: Secondary | ICD-10-CM | POA: Diagnosis not present

## 2017-10-23 DIAGNOSIS — N186 End stage renal disease: Secondary | ICD-10-CM | POA: Diagnosis not present

## 2017-10-23 DIAGNOSIS — N2581 Secondary hyperparathyroidism of renal origin: Secondary | ICD-10-CM | POA: Diagnosis not present

## 2017-10-23 DIAGNOSIS — D631 Anemia in chronic kidney disease: Secondary | ICD-10-CM | POA: Diagnosis not present

## 2017-10-23 DIAGNOSIS — Z992 Dependence on renal dialysis: Secondary | ICD-10-CM | POA: Diagnosis not present

## 2017-10-23 DIAGNOSIS — E1122 Type 2 diabetes mellitus with diabetic chronic kidney disease: Secondary | ICD-10-CM | POA: Diagnosis not present

## 2017-10-23 DIAGNOSIS — R509 Fever, unspecified: Secondary | ICD-10-CM | POA: Diagnosis not present

## 2017-10-26 DIAGNOSIS — D509 Iron deficiency anemia, unspecified: Secondary | ICD-10-CM | POA: Diagnosis not present

## 2017-10-26 DIAGNOSIS — N186 End stage renal disease: Secondary | ICD-10-CM | POA: Diagnosis not present

## 2017-10-26 DIAGNOSIS — R0689 Other abnormalities of breathing: Secondary | ICD-10-CM | POA: Diagnosis not present

## 2017-10-26 DIAGNOSIS — K5900 Constipation, unspecified: Secondary | ICD-10-CM | POA: Diagnosis not present

## 2017-10-26 DIAGNOSIS — E119 Type 2 diabetes mellitus without complications: Secondary | ICD-10-CM | POA: Diagnosis not present

## 2017-10-26 DIAGNOSIS — E1121 Type 2 diabetes mellitus with diabetic nephropathy: Secondary | ICD-10-CM | POA: Diagnosis not present

## 2017-10-26 DIAGNOSIS — N2581 Secondary hyperparathyroidism of renal origin: Secondary | ICD-10-CM | POA: Diagnosis not present

## 2017-10-28 DIAGNOSIS — N186 End stage renal disease: Secondary | ICD-10-CM | POA: Diagnosis not present

## 2017-10-28 DIAGNOSIS — N2581 Secondary hyperparathyroidism of renal origin: Secondary | ICD-10-CM | POA: Diagnosis not present

## 2017-10-28 DIAGNOSIS — E1121 Type 2 diabetes mellitus with diabetic nephropathy: Secondary | ICD-10-CM | POA: Diagnosis not present

## 2017-10-28 DIAGNOSIS — D509 Iron deficiency anemia, unspecified: Secondary | ICD-10-CM | POA: Diagnosis not present

## 2017-10-30 DIAGNOSIS — N186 End stage renal disease: Secondary | ICD-10-CM | POA: Diagnosis not present

## 2017-10-30 DIAGNOSIS — E1121 Type 2 diabetes mellitus with diabetic nephropathy: Secondary | ICD-10-CM | POA: Diagnosis not present

## 2017-10-30 DIAGNOSIS — M25462 Effusion, left knee: Secondary | ICD-10-CM | POA: Diagnosis not present

## 2017-10-30 DIAGNOSIS — K5900 Constipation, unspecified: Secondary | ICD-10-CM | POA: Diagnosis not present

## 2017-10-30 DIAGNOSIS — D509 Iron deficiency anemia, unspecified: Secondary | ICD-10-CM | POA: Diagnosis not present

## 2017-10-30 DIAGNOSIS — N2581 Secondary hyperparathyroidism of renal origin: Secondary | ICD-10-CM | POA: Diagnosis not present

## 2017-10-30 DIAGNOSIS — I10 Essential (primary) hypertension: Secondary | ICD-10-CM | POA: Diagnosis not present

## 2017-11-02 DIAGNOSIS — E1121 Type 2 diabetes mellitus with diabetic nephropathy: Secondary | ICD-10-CM | POA: Diagnosis not present

## 2017-11-02 DIAGNOSIS — D509 Iron deficiency anemia, unspecified: Secondary | ICD-10-CM | POA: Diagnosis not present

## 2017-11-02 DIAGNOSIS — N186 End stage renal disease: Secondary | ICD-10-CM | POA: Diagnosis not present

## 2017-11-02 DIAGNOSIS — N2581 Secondary hyperparathyroidism of renal origin: Secondary | ICD-10-CM | POA: Diagnosis not present

## 2017-11-03 DIAGNOSIS — Z79891 Long term (current) use of opiate analgesic: Secondary | ICD-10-CM | POA: Diagnosis not present

## 2017-11-03 DIAGNOSIS — S82122D Displaced fracture of lateral condyle of left tibia, subsequent encounter for closed fracture with routine healing: Secondary | ICD-10-CM | POA: Diagnosis not present

## 2017-11-03 DIAGNOSIS — Z4789 Encounter for other orthopedic aftercare: Secondary | ICD-10-CM | POA: Diagnosis not present

## 2017-11-03 DIAGNOSIS — Z4802 Encounter for removal of sutures: Secondary | ICD-10-CM | POA: Diagnosis not present

## 2017-11-04 DIAGNOSIS — E1121 Type 2 diabetes mellitus with diabetic nephropathy: Secondary | ICD-10-CM | POA: Diagnosis not present

## 2017-11-04 DIAGNOSIS — D509 Iron deficiency anemia, unspecified: Secondary | ICD-10-CM | POA: Diagnosis not present

## 2017-11-04 DIAGNOSIS — N2581 Secondary hyperparathyroidism of renal origin: Secondary | ICD-10-CM | POA: Diagnosis not present

## 2017-11-04 DIAGNOSIS — N186 End stage renal disease: Secondary | ICD-10-CM | POA: Diagnosis not present

## 2017-11-06 DIAGNOSIS — N2581 Secondary hyperparathyroidism of renal origin: Secondary | ICD-10-CM | POA: Diagnosis not present

## 2017-11-06 DIAGNOSIS — E1121 Type 2 diabetes mellitus with diabetic nephropathy: Secondary | ICD-10-CM | POA: Diagnosis not present

## 2017-11-06 DIAGNOSIS — E119 Type 2 diabetes mellitus without complications: Secondary | ICD-10-CM | POA: Diagnosis not present

## 2017-11-06 DIAGNOSIS — E039 Hypothyroidism, unspecified: Secondary | ICD-10-CM | POA: Diagnosis not present

## 2017-11-06 DIAGNOSIS — S82112D Displaced fracture of left tibial spine, subsequent encounter for closed fracture with routine healing: Secondary | ICD-10-CM | POA: Diagnosis not present

## 2017-11-06 DIAGNOSIS — N186 End stage renal disease: Secondary | ICD-10-CM | POA: Diagnosis not present

## 2017-11-06 DIAGNOSIS — D509 Iron deficiency anemia, unspecified: Secondary | ICD-10-CM | POA: Diagnosis not present

## 2017-11-09 DIAGNOSIS — E782 Mixed hyperlipidemia: Secondary | ICD-10-CM | POA: Diagnosis not present

## 2017-11-09 DIAGNOSIS — N186 End stage renal disease: Secondary | ICD-10-CM | POA: Diagnosis not present

## 2017-11-09 DIAGNOSIS — E1121 Type 2 diabetes mellitus with diabetic nephropathy: Secondary | ICD-10-CM | POA: Diagnosis not present

## 2017-11-09 DIAGNOSIS — I10 Essential (primary) hypertension: Secondary | ICD-10-CM | POA: Diagnosis not present

## 2017-11-09 DIAGNOSIS — N2581 Secondary hyperparathyroidism of renal origin: Secondary | ICD-10-CM | POA: Diagnosis not present

## 2017-11-09 DIAGNOSIS — E119 Type 2 diabetes mellitus without complications: Secondary | ICD-10-CM | POA: Diagnosis not present

## 2017-11-09 DIAGNOSIS — D509 Iron deficiency anemia, unspecified: Secondary | ICD-10-CM | POA: Diagnosis not present

## 2017-11-09 DIAGNOSIS — E039 Hypothyroidism, unspecified: Secondary | ICD-10-CM | POA: Diagnosis not present

## 2017-11-11 DIAGNOSIS — D509 Iron deficiency anemia, unspecified: Secondary | ICD-10-CM | POA: Diagnosis not present

## 2017-11-11 DIAGNOSIS — N2581 Secondary hyperparathyroidism of renal origin: Secondary | ICD-10-CM | POA: Diagnosis not present

## 2017-11-11 DIAGNOSIS — E1121 Type 2 diabetes mellitus with diabetic nephropathy: Secondary | ICD-10-CM | POA: Diagnosis not present

## 2017-11-11 DIAGNOSIS — N186 End stage renal disease: Secondary | ICD-10-CM | POA: Diagnosis not present

## 2017-11-12 DIAGNOSIS — E119 Type 2 diabetes mellitus without complications: Secondary | ICD-10-CM | POA: Diagnosis not present

## 2017-11-12 DIAGNOSIS — N186 End stage renal disease: Secondary | ICD-10-CM | POA: Diagnosis not present

## 2017-11-12 DIAGNOSIS — S82122D Displaced fracture of lateral condyle of left tibia, subsequent encounter for closed fracture with routine healing: Secondary | ICD-10-CM | POA: Diagnosis not present

## 2017-11-12 DIAGNOSIS — E039 Hypothyroidism, unspecified: Secondary | ICD-10-CM | POA: Diagnosis not present

## 2017-11-13 DIAGNOSIS — N2581 Secondary hyperparathyroidism of renal origin: Secondary | ICD-10-CM | POA: Diagnosis not present

## 2017-11-13 DIAGNOSIS — E1121 Type 2 diabetes mellitus with diabetic nephropathy: Secondary | ICD-10-CM | POA: Diagnosis not present

## 2017-11-13 DIAGNOSIS — N186 End stage renal disease: Secondary | ICD-10-CM | POA: Diagnosis not present

## 2017-11-13 DIAGNOSIS — D509 Iron deficiency anemia, unspecified: Secondary | ICD-10-CM | POA: Diagnosis not present

## 2017-11-15 DIAGNOSIS — D509 Iron deficiency anemia, unspecified: Secondary | ICD-10-CM | POA: Diagnosis not present

## 2017-11-15 DIAGNOSIS — N186 End stage renal disease: Secondary | ICD-10-CM | POA: Diagnosis not present

## 2017-11-15 DIAGNOSIS — E1121 Type 2 diabetes mellitus with diabetic nephropathy: Secondary | ICD-10-CM | POA: Diagnosis not present

## 2017-11-15 DIAGNOSIS — N2581 Secondary hyperparathyroidism of renal origin: Secondary | ICD-10-CM | POA: Diagnosis not present

## 2017-11-17 DIAGNOSIS — F329 Major depressive disorder, single episode, unspecified: Secondary | ICD-10-CM | POA: Diagnosis not present

## 2017-11-17 DIAGNOSIS — E119 Type 2 diabetes mellitus without complications: Secondary | ICD-10-CM | POA: Diagnosis not present

## 2017-11-17 DIAGNOSIS — I10 Essential (primary) hypertension: Secondary | ICD-10-CM | POA: Diagnosis not present

## 2017-11-17 DIAGNOSIS — E039 Hypothyroidism, unspecified: Secondary | ICD-10-CM | POA: Diagnosis not present

## 2017-11-18 DIAGNOSIS — E1121 Type 2 diabetes mellitus with diabetic nephropathy: Secondary | ICD-10-CM | POA: Diagnosis not present

## 2017-11-18 DIAGNOSIS — N2581 Secondary hyperparathyroidism of renal origin: Secondary | ICD-10-CM | POA: Diagnosis not present

## 2017-11-18 DIAGNOSIS — N186 End stage renal disease: Secondary | ICD-10-CM | POA: Diagnosis not present

## 2017-11-18 DIAGNOSIS — D509 Iron deficiency anemia, unspecified: Secondary | ICD-10-CM | POA: Diagnosis not present

## 2017-11-19 DIAGNOSIS — M65342 Trigger finger, left ring finger: Secondary | ICD-10-CM | POA: Diagnosis not present

## 2017-11-20 DIAGNOSIS — N186 End stage renal disease: Secondary | ICD-10-CM | POA: Diagnosis not present

## 2017-11-20 DIAGNOSIS — E1121 Type 2 diabetes mellitus with diabetic nephropathy: Secondary | ICD-10-CM | POA: Diagnosis not present

## 2017-11-20 DIAGNOSIS — N2581 Secondary hyperparathyroidism of renal origin: Secondary | ICD-10-CM | POA: Diagnosis not present

## 2017-11-20 DIAGNOSIS — D509 Iron deficiency anemia, unspecified: Secondary | ICD-10-CM | POA: Diagnosis not present

## 2017-11-22 DIAGNOSIS — N186 End stage renal disease: Secondary | ICD-10-CM | POA: Diagnosis not present

## 2017-11-22 DIAGNOSIS — E1121 Type 2 diabetes mellitus with diabetic nephropathy: Secondary | ICD-10-CM | POA: Diagnosis not present

## 2017-11-22 DIAGNOSIS — N2581 Secondary hyperparathyroidism of renal origin: Secondary | ICD-10-CM | POA: Diagnosis not present

## 2017-11-22 DIAGNOSIS — D509 Iron deficiency anemia, unspecified: Secondary | ICD-10-CM | POA: Diagnosis not present

## 2017-11-23 DIAGNOSIS — N186 End stage renal disease: Secondary | ICD-10-CM | POA: Diagnosis not present

## 2017-11-23 DIAGNOSIS — E1122 Type 2 diabetes mellitus with diabetic chronic kidney disease: Secondary | ICD-10-CM | POA: Diagnosis not present

## 2017-11-23 DIAGNOSIS — Z992 Dependence on renal dialysis: Secondary | ICD-10-CM | POA: Diagnosis not present

## 2017-11-25 DIAGNOSIS — D509 Iron deficiency anemia, unspecified: Secondary | ICD-10-CM | POA: Diagnosis not present

## 2017-11-25 DIAGNOSIS — N186 End stage renal disease: Secondary | ICD-10-CM | POA: Diagnosis not present

## 2017-11-25 DIAGNOSIS — E1121 Type 2 diabetes mellitus with diabetic nephropathy: Secondary | ICD-10-CM | POA: Diagnosis not present

## 2017-11-25 DIAGNOSIS — N2581 Secondary hyperparathyroidism of renal origin: Secondary | ICD-10-CM | POA: Diagnosis not present

## 2017-11-27 DIAGNOSIS — D509 Iron deficiency anemia, unspecified: Secondary | ICD-10-CM | POA: Diagnosis not present

## 2017-11-27 DIAGNOSIS — N2581 Secondary hyperparathyroidism of renal origin: Secondary | ICD-10-CM | POA: Diagnosis not present

## 2017-11-27 DIAGNOSIS — N186 End stage renal disease: Secondary | ICD-10-CM | POA: Diagnosis not present

## 2017-11-27 DIAGNOSIS — E1121 Type 2 diabetes mellitus with diabetic nephropathy: Secondary | ICD-10-CM | POA: Diagnosis not present

## 2017-11-30 DIAGNOSIS — D509 Iron deficiency anemia, unspecified: Secondary | ICD-10-CM | POA: Diagnosis not present

## 2017-11-30 DIAGNOSIS — N186 End stage renal disease: Secondary | ICD-10-CM | POA: Diagnosis not present

## 2017-11-30 DIAGNOSIS — N2581 Secondary hyperparathyroidism of renal origin: Secondary | ICD-10-CM | POA: Diagnosis not present

## 2017-11-30 DIAGNOSIS — E1121 Type 2 diabetes mellitus with diabetic nephropathy: Secondary | ICD-10-CM | POA: Diagnosis not present

## 2017-12-01 DIAGNOSIS — R5381 Other malaise: Secondary | ICD-10-CM | POA: Diagnosis not present

## 2017-12-01 DIAGNOSIS — R05 Cough: Secondary | ICD-10-CM | POA: Diagnosis not present

## 2017-12-01 DIAGNOSIS — H109 Unspecified conjunctivitis: Secondary | ICD-10-CM | POA: Diagnosis not present

## 2017-12-01 DIAGNOSIS — J3489 Other specified disorders of nose and nasal sinuses: Secondary | ICD-10-CM | POA: Diagnosis not present

## 2017-12-02 DIAGNOSIS — D509 Iron deficiency anemia, unspecified: Secondary | ICD-10-CM | POA: Diagnosis not present

## 2017-12-02 DIAGNOSIS — E1121 Type 2 diabetes mellitus with diabetic nephropathy: Secondary | ICD-10-CM | POA: Diagnosis not present

## 2017-12-02 DIAGNOSIS — N2581 Secondary hyperparathyroidism of renal origin: Secondary | ICD-10-CM | POA: Diagnosis not present

## 2017-12-02 DIAGNOSIS — N186 End stage renal disease: Secondary | ICD-10-CM | POA: Diagnosis not present

## 2017-12-03 DIAGNOSIS — M65342 Trigger finger, left ring finger: Secondary | ICD-10-CM | POA: Diagnosis not present

## 2017-12-04 DIAGNOSIS — E1121 Type 2 diabetes mellitus with diabetic nephropathy: Secondary | ICD-10-CM | POA: Diagnosis not present

## 2017-12-04 DIAGNOSIS — N2581 Secondary hyperparathyroidism of renal origin: Secondary | ICD-10-CM | POA: Diagnosis not present

## 2017-12-04 DIAGNOSIS — N186 End stage renal disease: Secondary | ICD-10-CM | POA: Diagnosis not present

## 2017-12-04 DIAGNOSIS — D509 Iron deficiency anemia, unspecified: Secondary | ICD-10-CM | POA: Diagnosis not present

## 2017-12-07 DIAGNOSIS — N2581 Secondary hyperparathyroidism of renal origin: Secondary | ICD-10-CM | POA: Diagnosis not present

## 2017-12-07 DIAGNOSIS — N186 End stage renal disease: Secondary | ICD-10-CM | POA: Diagnosis not present

## 2017-12-07 DIAGNOSIS — E1121 Type 2 diabetes mellitus with diabetic nephropathy: Secondary | ICD-10-CM | POA: Diagnosis not present

## 2017-12-07 DIAGNOSIS — D509 Iron deficiency anemia, unspecified: Secondary | ICD-10-CM | POA: Diagnosis not present

## 2017-12-08 DIAGNOSIS — R05 Cough: Secondary | ICD-10-CM | POA: Diagnosis not present

## 2017-12-08 DIAGNOSIS — N186 End stage renal disease: Secondary | ICD-10-CM | POA: Diagnosis not present

## 2017-12-08 DIAGNOSIS — E119 Type 2 diabetes mellitus without complications: Secondary | ICD-10-CM | POA: Diagnosis not present

## 2017-12-08 DIAGNOSIS — I10 Essential (primary) hypertension: Secondary | ICD-10-CM | POA: Diagnosis not present

## 2017-12-09 DIAGNOSIS — D509 Iron deficiency anemia, unspecified: Secondary | ICD-10-CM | POA: Diagnosis not present

## 2017-12-09 DIAGNOSIS — N2581 Secondary hyperparathyroidism of renal origin: Secondary | ICD-10-CM | POA: Diagnosis not present

## 2017-12-09 DIAGNOSIS — E1121 Type 2 diabetes mellitus with diabetic nephropathy: Secondary | ICD-10-CM | POA: Diagnosis not present

## 2017-12-09 DIAGNOSIS — N186 End stage renal disease: Secondary | ICD-10-CM | POA: Diagnosis not present

## 2017-12-11 DIAGNOSIS — N2581 Secondary hyperparathyroidism of renal origin: Secondary | ICD-10-CM | POA: Diagnosis not present

## 2017-12-11 DIAGNOSIS — N186 End stage renal disease: Secondary | ICD-10-CM | POA: Diagnosis not present

## 2017-12-11 DIAGNOSIS — E1121 Type 2 diabetes mellitus with diabetic nephropathy: Secondary | ICD-10-CM | POA: Diagnosis not present

## 2017-12-11 DIAGNOSIS — D509 Iron deficiency anemia, unspecified: Secondary | ICD-10-CM | POA: Diagnosis not present

## 2017-12-14 DIAGNOSIS — N2581 Secondary hyperparathyroidism of renal origin: Secondary | ICD-10-CM | POA: Diagnosis not present

## 2017-12-14 DIAGNOSIS — E1121 Type 2 diabetes mellitus with diabetic nephropathy: Secondary | ICD-10-CM | POA: Diagnosis not present

## 2017-12-14 DIAGNOSIS — N186 End stage renal disease: Secondary | ICD-10-CM | POA: Diagnosis not present

## 2017-12-14 DIAGNOSIS — D509 Iron deficiency anemia, unspecified: Secondary | ICD-10-CM | POA: Diagnosis not present

## 2017-12-16 DIAGNOSIS — N186 End stage renal disease: Secondary | ICD-10-CM | POA: Diagnosis not present

## 2017-12-16 DIAGNOSIS — N2581 Secondary hyperparathyroidism of renal origin: Secondary | ICD-10-CM | POA: Diagnosis not present

## 2017-12-16 DIAGNOSIS — E1121 Type 2 diabetes mellitus with diabetic nephropathy: Secondary | ICD-10-CM | POA: Diagnosis not present

## 2017-12-16 DIAGNOSIS — D509 Iron deficiency anemia, unspecified: Secondary | ICD-10-CM | POA: Diagnosis not present

## 2017-12-17 DIAGNOSIS — I7090 Unspecified atherosclerosis: Secondary | ICD-10-CM | POA: Diagnosis not present

## 2017-12-17 DIAGNOSIS — M1712 Unilateral primary osteoarthritis, left knee: Secondary | ICD-10-CM | POA: Diagnosis not present

## 2017-12-17 DIAGNOSIS — M25462 Effusion, left knee: Secondary | ICD-10-CM | POA: Diagnosis not present

## 2017-12-17 DIAGNOSIS — S82122D Displaced fracture of lateral condyle of left tibia, subsequent encounter for closed fracture with routine healing: Secondary | ICD-10-CM | POA: Diagnosis not present

## 2017-12-18 DIAGNOSIS — D509 Iron deficiency anemia, unspecified: Secondary | ICD-10-CM | POA: Diagnosis not present

## 2017-12-18 DIAGNOSIS — N186 End stage renal disease: Secondary | ICD-10-CM | POA: Diagnosis not present

## 2017-12-18 DIAGNOSIS — N2581 Secondary hyperparathyroidism of renal origin: Secondary | ICD-10-CM | POA: Diagnosis not present

## 2017-12-18 DIAGNOSIS — E1121 Type 2 diabetes mellitus with diabetic nephropathy: Secondary | ICD-10-CM | POA: Diagnosis not present

## 2017-12-21 DIAGNOSIS — N186 End stage renal disease: Secondary | ICD-10-CM | POA: Diagnosis not present

## 2017-12-21 DIAGNOSIS — E1121 Type 2 diabetes mellitus with diabetic nephropathy: Secondary | ICD-10-CM | POA: Diagnosis not present

## 2017-12-21 DIAGNOSIS — N2581 Secondary hyperparathyroidism of renal origin: Secondary | ICD-10-CM | POA: Diagnosis not present

## 2017-12-21 DIAGNOSIS — D509 Iron deficiency anemia, unspecified: Secondary | ICD-10-CM | POA: Diagnosis not present

## 2017-12-23 DIAGNOSIS — E1121 Type 2 diabetes mellitus with diabetic nephropathy: Secondary | ICD-10-CM | POA: Diagnosis not present

## 2017-12-23 DIAGNOSIS — N2581 Secondary hyperparathyroidism of renal origin: Secondary | ICD-10-CM | POA: Diagnosis not present

## 2017-12-23 DIAGNOSIS — N186 End stage renal disease: Secondary | ICD-10-CM | POA: Diagnosis not present

## 2017-12-23 DIAGNOSIS — D509 Iron deficiency anemia, unspecified: Secondary | ICD-10-CM | POA: Diagnosis not present

## 2017-12-24 DIAGNOSIS — Z992 Dependence on renal dialysis: Secondary | ICD-10-CM | POA: Diagnosis not present

## 2017-12-24 DIAGNOSIS — E1122 Type 2 diabetes mellitus with diabetic chronic kidney disease: Secondary | ICD-10-CM | POA: Diagnosis not present

## 2017-12-24 DIAGNOSIS — N186 End stage renal disease: Secondary | ICD-10-CM | POA: Diagnosis not present

## 2017-12-25 DIAGNOSIS — N186 End stage renal disease: Secondary | ICD-10-CM | POA: Diagnosis not present

## 2017-12-25 DIAGNOSIS — Z992 Dependence on renal dialysis: Secondary | ICD-10-CM | POA: Diagnosis not present

## 2017-12-25 DIAGNOSIS — E1122 Type 2 diabetes mellitus with diabetic chronic kidney disease: Secondary | ICD-10-CM | POA: Diagnosis not present

## 2017-12-25 DIAGNOSIS — N2581 Secondary hyperparathyroidism of renal origin: Secondary | ICD-10-CM | POA: Diagnosis not present

## 2017-12-28 DIAGNOSIS — N186 End stage renal disease: Secondary | ICD-10-CM | POA: Diagnosis not present

## 2017-12-28 DIAGNOSIS — N2581 Secondary hyperparathyroidism of renal origin: Secondary | ICD-10-CM | POA: Diagnosis not present

## 2017-12-29 DIAGNOSIS — E119 Type 2 diabetes mellitus without complications: Secondary | ICD-10-CM | POA: Diagnosis not present

## 2017-12-30 DIAGNOSIS — N2581 Secondary hyperparathyroidism of renal origin: Secondary | ICD-10-CM | POA: Diagnosis not present

## 2017-12-30 DIAGNOSIS — N186 End stage renal disease: Secondary | ICD-10-CM | POA: Diagnosis not present

## 2018-01-01 DIAGNOSIS — N2581 Secondary hyperparathyroidism of renal origin: Secondary | ICD-10-CM | POA: Diagnosis not present

## 2018-01-01 DIAGNOSIS — N186 End stage renal disease: Secondary | ICD-10-CM | POA: Diagnosis not present

## 2018-01-04 DIAGNOSIS — N2581 Secondary hyperparathyroidism of renal origin: Secondary | ICD-10-CM | POA: Diagnosis not present

## 2018-01-04 DIAGNOSIS — N186 End stage renal disease: Secondary | ICD-10-CM | POA: Diagnosis not present

## 2018-01-05 DIAGNOSIS — N186 End stage renal disease: Secondary | ICD-10-CM | POA: Diagnosis not present

## 2018-01-05 DIAGNOSIS — I10 Essential (primary) hypertension: Secondary | ICD-10-CM | POA: Diagnosis not present

## 2018-01-05 DIAGNOSIS — E119 Type 2 diabetes mellitus without complications: Secondary | ICD-10-CM | POA: Diagnosis not present

## 2018-01-06 DIAGNOSIS — N2581 Secondary hyperparathyroidism of renal origin: Secondary | ICD-10-CM | POA: Diagnosis not present

## 2018-01-06 DIAGNOSIS — N186 End stage renal disease: Secondary | ICD-10-CM | POA: Diagnosis not present

## 2018-01-07 DIAGNOSIS — T82858A Stenosis of vascular prosthetic devices, implants and grafts, initial encounter: Secondary | ICD-10-CM | POA: Diagnosis not present

## 2018-01-07 DIAGNOSIS — N186 End stage renal disease: Secondary | ICD-10-CM | POA: Diagnosis not present

## 2018-01-07 DIAGNOSIS — I871 Compression of vein: Secondary | ICD-10-CM | POA: Diagnosis not present

## 2018-01-07 DIAGNOSIS — Z992 Dependence on renal dialysis: Secondary | ICD-10-CM | POA: Diagnosis not present

## 2018-01-08 DIAGNOSIS — N186 End stage renal disease: Secondary | ICD-10-CM | POA: Diagnosis not present

## 2018-01-08 DIAGNOSIS — N2581 Secondary hyperparathyroidism of renal origin: Secondary | ICD-10-CM | POA: Diagnosis not present

## 2018-01-08 DIAGNOSIS — I10 Essential (primary) hypertension: Secondary | ICD-10-CM | POA: Diagnosis not present

## 2018-01-08 DIAGNOSIS — E119 Type 2 diabetes mellitus without complications: Secondary | ICD-10-CM | POA: Diagnosis not present

## 2018-01-11 DIAGNOSIS — N186 End stage renal disease: Secondary | ICD-10-CM | POA: Diagnosis not present

## 2018-01-11 DIAGNOSIS — N2581 Secondary hyperparathyroidism of renal origin: Secondary | ICD-10-CM | POA: Diagnosis not present

## 2018-01-13 DIAGNOSIS — I5032 Chronic diastolic (congestive) heart failure: Secondary | ICD-10-CM | POA: Diagnosis not present

## 2018-01-13 DIAGNOSIS — I132 Hypertensive heart and chronic kidney disease with heart failure and with stage 5 chronic kidney disease, or end stage renal disease: Secondary | ICD-10-CM | POA: Diagnosis not present

## 2018-01-13 DIAGNOSIS — N186 End stage renal disease: Secondary | ICD-10-CM | POA: Diagnosis not present

## 2018-01-13 DIAGNOSIS — J449 Chronic obstructive pulmonary disease, unspecified: Secondary | ICD-10-CM | POA: Diagnosis not present

## 2018-01-13 DIAGNOSIS — Z992 Dependence on renal dialysis: Secondary | ICD-10-CM | POA: Diagnosis not present

## 2018-01-13 DIAGNOSIS — E1122 Type 2 diabetes mellitus with diabetic chronic kidney disease: Secondary | ICD-10-CM | POA: Diagnosis not present

## 2018-01-14 DIAGNOSIS — N186 End stage renal disease: Secondary | ICD-10-CM | POA: Diagnosis not present

## 2018-01-14 DIAGNOSIS — N2581 Secondary hyperparathyroidism of renal origin: Secondary | ICD-10-CM | POA: Diagnosis not present

## 2018-01-15 DIAGNOSIS — I132 Hypertensive heart and chronic kidney disease with heart failure and with stage 5 chronic kidney disease, or end stage renal disease: Secondary | ICD-10-CM | POA: Diagnosis not present

## 2018-01-15 DIAGNOSIS — N186 End stage renal disease: Secondary | ICD-10-CM | POA: Diagnosis not present

## 2018-01-15 DIAGNOSIS — E1122 Type 2 diabetes mellitus with diabetic chronic kidney disease: Secondary | ICD-10-CM | POA: Diagnosis not present

## 2018-01-15 DIAGNOSIS — I5032 Chronic diastolic (congestive) heart failure: Secondary | ICD-10-CM | POA: Diagnosis not present

## 2018-01-15 DIAGNOSIS — J449 Chronic obstructive pulmonary disease, unspecified: Secondary | ICD-10-CM | POA: Diagnosis not present

## 2018-01-15 DIAGNOSIS — Z992 Dependence on renal dialysis: Secondary | ICD-10-CM | POA: Diagnosis not present

## 2018-01-16 DIAGNOSIS — N186 End stage renal disease: Secondary | ICD-10-CM | POA: Diagnosis not present

## 2018-01-16 DIAGNOSIS — N2581 Secondary hyperparathyroidism of renal origin: Secondary | ICD-10-CM | POA: Diagnosis not present

## 2018-01-19 DIAGNOSIS — N2581 Secondary hyperparathyroidism of renal origin: Secondary | ICD-10-CM | POA: Diagnosis not present

## 2018-01-19 DIAGNOSIS — N186 End stage renal disease: Secondary | ICD-10-CM | POA: Diagnosis not present

## 2018-01-20 DIAGNOSIS — Z992 Dependence on renal dialysis: Secondary | ICD-10-CM | POA: Diagnosis not present

## 2018-01-20 DIAGNOSIS — N186 End stage renal disease: Secondary | ICD-10-CM | POA: Diagnosis not present

## 2018-01-20 DIAGNOSIS — I5032 Chronic diastolic (congestive) heart failure: Secondary | ICD-10-CM | POA: Diagnosis not present

## 2018-01-20 DIAGNOSIS — E1122 Type 2 diabetes mellitus with diabetic chronic kidney disease: Secondary | ICD-10-CM | POA: Diagnosis not present

## 2018-01-20 DIAGNOSIS — I132 Hypertensive heart and chronic kidney disease with heart failure and with stage 5 chronic kidney disease, or end stage renal disease: Secondary | ICD-10-CM | POA: Diagnosis not present

## 2018-01-20 DIAGNOSIS — J449 Chronic obstructive pulmonary disease, unspecified: Secondary | ICD-10-CM | POA: Diagnosis not present

## 2018-01-21 DIAGNOSIS — N2581 Secondary hyperparathyroidism of renal origin: Secondary | ICD-10-CM | POA: Diagnosis not present

## 2018-01-21 DIAGNOSIS — N186 End stage renal disease: Secondary | ICD-10-CM | POA: Diagnosis not present

## 2018-01-21 DIAGNOSIS — J449 Chronic obstructive pulmonary disease, unspecified: Secondary | ICD-10-CM | POA: Diagnosis not present

## 2018-01-22 DIAGNOSIS — E1122 Type 2 diabetes mellitus with diabetic chronic kidney disease: Secondary | ICD-10-CM | POA: Diagnosis not present

## 2018-01-22 DIAGNOSIS — I132 Hypertensive heart and chronic kidney disease with heart failure and with stage 5 chronic kidney disease, or end stage renal disease: Secondary | ICD-10-CM | POA: Diagnosis not present

## 2018-01-22 DIAGNOSIS — I5032 Chronic diastolic (congestive) heart failure: Secondary | ICD-10-CM | POA: Diagnosis not present

## 2018-01-22 DIAGNOSIS — N2581 Secondary hyperparathyroidism of renal origin: Secondary | ICD-10-CM | POA: Diagnosis not present

## 2018-01-22 DIAGNOSIS — J449 Chronic obstructive pulmonary disease, unspecified: Secondary | ICD-10-CM | POA: Diagnosis not present

## 2018-01-22 DIAGNOSIS — D509 Iron deficiency anemia, unspecified: Secondary | ICD-10-CM | POA: Diagnosis not present

## 2018-01-22 DIAGNOSIS — Z992 Dependence on renal dialysis: Secondary | ICD-10-CM | POA: Diagnosis not present

## 2018-01-22 DIAGNOSIS — N186 End stage renal disease: Secondary | ICD-10-CM | POA: Diagnosis not present

## 2018-01-23 DIAGNOSIS — N2581 Secondary hyperparathyroidism of renal origin: Secondary | ICD-10-CM | POA: Diagnosis not present

## 2018-01-23 DIAGNOSIS — D509 Iron deficiency anemia, unspecified: Secondary | ICD-10-CM | POA: Diagnosis not present

## 2018-01-23 DIAGNOSIS — N186 End stage renal disease: Secondary | ICD-10-CM | POA: Diagnosis not present

## 2018-01-25 DIAGNOSIS — I132 Hypertensive heart and chronic kidney disease with heart failure and with stage 5 chronic kidney disease, or end stage renal disease: Secondary | ICD-10-CM | POA: Diagnosis not present

## 2018-01-25 DIAGNOSIS — E1142 Type 2 diabetes mellitus with diabetic polyneuropathy: Secondary | ICD-10-CM | POA: Diagnosis not present

## 2018-01-25 DIAGNOSIS — Z992 Dependence on renal dialysis: Secondary | ICD-10-CM | POA: Diagnosis not present

## 2018-01-25 DIAGNOSIS — J449 Chronic obstructive pulmonary disease, unspecified: Secondary | ICD-10-CM | POA: Diagnosis not present

## 2018-01-25 DIAGNOSIS — Z79899 Other long term (current) drug therapy: Secondary | ICD-10-CM | POA: Diagnosis not present

## 2018-01-25 DIAGNOSIS — E1122 Type 2 diabetes mellitus with diabetic chronic kidney disease: Secondary | ICD-10-CM | POA: Diagnosis not present

## 2018-01-25 DIAGNOSIS — N186 End stage renal disease: Secondary | ICD-10-CM | POA: Diagnosis not present

## 2018-01-25 DIAGNOSIS — E039 Hypothyroidism, unspecified: Secondary | ICD-10-CM | POA: Diagnosis not present

## 2018-01-25 DIAGNOSIS — I5032 Chronic diastolic (congestive) heart failure: Secondary | ICD-10-CM | POA: Diagnosis not present

## 2018-01-26 DIAGNOSIS — N2581 Secondary hyperparathyroidism of renal origin: Secondary | ICD-10-CM | POA: Diagnosis not present

## 2018-01-26 DIAGNOSIS — D509 Iron deficiency anemia, unspecified: Secondary | ICD-10-CM | POA: Diagnosis not present

## 2018-01-26 DIAGNOSIS — N186 End stage renal disease: Secondary | ICD-10-CM | POA: Diagnosis not present

## 2018-01-27 DIAGNOSIS — I132 Hypertensive heart and chronic kidney disease with heart failure and with stage 5 chronic kidney disease, or end stage renal disease: Secondary | ICD-10-CM | POA: Diagnosis not present

## 2018-01-27 DIAGNOSIS — N186 End stage renal disease: Secondary | ICD-10-CM | POA: Diagnosis not present

## 2018-01-27 DIAGNOSIS — Z992 Dependence on renal dialysis: Secondary | ICD-10-CM | POA: Diagnosis not present

## 2018-01-27 DIAGNOSIS — E1122 Type 2 diabetes mellitus with diabetic chronic kidney disease: Secondary | ICD-10-CM | POA: Diagnosis not present

## 2018-01-27 DIAGNOSIS — I5032 Chronic diastolic (congestive) heart failure: Secondary | ICD-10-CM | POA: Diagnosis not present

## 2018-01-27 DIAGNOSIS — S82142D Displaced bicondylar fracture of left tibia, subsequent encounter for closed fracture with routine healing: Secondary | ICD-10-CM | POA: Diagnosis not present

## 2018-01-27 DIAGNOSIS — J449 Chronic obstructive pulmonary disease, unspecified: Secondary | ICD-10-CM | POA: Diagnosis not present

## 2018-01-27 DIAGNOSIS — E1121 Type 2 diabetes mellitus with diabetic nephropathy: Secondary | ICD-10-CM | POA: Diagnosis not present

## 2018-01-28 DIAGNOSIS — D509 Iron deficiency anemia, unspecified: Secondary | ICD-10-CM | POA: Diagnosis not present

## 2018-01-28 DIAGNOSIS — S82122D Displaced fracture of lateral condyle of left tibia, subsequent encounter for closed fracture with routine healing: Secondary | ICD-10-CM | POA: Diagnosis not present

## 2018-01-28 DIAGNOSIS — M25462 Effusion, left knee: Secondary | ICD-10-CM | POA: Diagnosis not present

## 2018-01-28 DIAGNOSIS — I7090 Unspecified atherosclerosis: Secondary | ICD-10-CM | POA: Diagnosis not present

## 2018-01-28 DIAGNOSIS — S82142D Displaced bicondylar fracture of left tibia, subsequent encounter for closed fracture with routine healing: Secondary | ICD-10-CM | POA: Diagnosis not present

## 2018-01-28 DIAGNOSIS — N186 End stage renal disease: Secondary | ICD-10-CM | POA: Diagnosis not present

## 2018-01-28 DIAGNOSIS — N2581 Secondary hyperparathyroidism of renal origin: Secondary | ICD-10-CM | POA: Diagnosis not present

## 2018-01-29 DIAGNOSIS — N186 End stage renal disease: Secondary | ICD-10-CM | POA: Diagnosis not present

## 2018-01-29 DIAGNOSIS — J449 Chronic obstructive pulmonary disease, unspecified: Secondary | ICD-10-CM | POA: Diagnosis not present

## 2018-01-29 DIAGNOSIS — I5032 Chronic diastolic (congestive) heart failure: Secondary | ICD-10-CM | POA: Diagnosis not present

## 2018-01-29 DIAGNOSIS — Z992 Dependence on renal dialysis: Secondary | ICD-10-CM | POA: Diagnosis not present

## 2018-01-29 DIAGNOSIS — E1122 Type 2 diabetes mellitus with diabetic chronic kidney disease: Secondary | ICD-10-CM | POA: Diagnosis not present

## 2018-01-29 DIAGNOSIS — I132 Hypertensive heart and chronic kidney disease with heart failure and with stage 5 chronic kidney disease, or end stage renal disease: Secondary | ICD-10-CM | POA: Diagnosis not present

## 2018-01-30 DIAGNOSIS — D509 Iron deficiency anemia, unspecified: Secondary | ICD-10-CM | POA: Diagnosis not present

## 2018-01-30 DIAGNOSIS — N2581 Secondary hyperparathyroidism of renal origin: Secondary | ICD-10-CM | POA: Diagnosis not present

## 2018-01-30 DIAGNOSIS — N186 End stage renal disease: Secondary | ICD-10-CM | POA: Diagnosis not present

## 2018-02-01 DIAGNOSIS — E1122 Type 2 diabetes mellitus with diabetic chronic kidney disease: Secondary | ICD-10-CM | POA: Diagnosis not present

## 2018-02-01 DIAGNOSIS — N186 End stage renal disease: Secondary | ICD-10-CM | POA: Diagnosis not present

## 2018-02-01 DIAGNOSIS — Z992 Dependence on renal dialysis: Secondary | ICD-10-CM | POA: Diagnosis not present

## 2018-02-01 DIAGNOSIS — J449 Chronic obstructive pulmonary disease, unspecified: Secondary | ICD-10-CM | POA: Diagnosis not present

## 2018-02-01 DIAGNOSIS — I5032 Chronic diastolic (congestive) heart failure: Secondary | ICD-10-CM | POA: Diagnosis not present

## 2018-02-01 DIAGNOSIS — I132 Hypertensive heart and chronic kidney disease with heart failure and with stage 5 chronic kidney disease, or end stage renal disease: Secondary | ICD-10-CM | POA: Diagnosis not present

## 2018-02-02 DIAGNOSIS — N186 End stage renal disease: Secondary | ICD-10-CM | POA: Diagnosis not present

## 2018-02-02 DIAGNOSIS — D509 Iron deficiency anemia, unspecified: Secondary | ICD-10-CM | POA: Diagnosis not present

## 2018-02-02 DIAGNOSIS — N2581 Secondary hyperparathyroidism of renal origin: Secondary | ICD-10-CM | POA: Diagnosis not present

## 2018-02-04 DIAGNOSIS — N186 End stage renal disease: Secondary | ICD-10-CM | POA: Diagnosis not present

## 2018-02-04 DIAGNOSIS — N2581 Secondary hyperparathyroidism of renal origin: Secondary | ICD-10-CM | POA: Diagnosis not present

## 2018-02-04 DIAGNOSIS — D509 Iron deficiency anemia, unspecified: Secondary | ICD-10-CM | POA: Diagnosis not present

## 2018-02-05 DIAGNOSIS — Z992 Dependence on renal dialysis: Secondary | ICD-10-CM | POA: Diagnosis not present

## 2018-02-05 DIAGNOSIS — I132 Hypertensive heart and chronic kidney disease with heart failure and with stage 5 chronic kidney disease, or end stage renal disease: Secondary | ICD-10-CM | POA: Diagnosis not present

## 2018-02-05 DIAGNOSIS — E1122 Type 2 diabetes mellitus with diabetic chronic kidney disease: Secondary | ICD-10-CM | POA: Diagnosis not present

## 2018-02-05 DIAGNOSIS — I5032 Chronic diastolic (congestive) heart failure: Secondary | ICD-10-CM | POA: Diagnosis not present

## 2018-02-05 DIAGNOSIS — J449 Chronic obstructive pulmonary disease, unspecified: Secondary | ICD-10-CM | POA: Diagnosis not present

## 2018-02-05 DIAGNOSIS — N186 End stage renal disease: Secondary | ICD-10-CM | POA: Diagnosis not present

## 2018-02-06 DIAGNOSIS — N186 End stage renal disease: Secondary | ICD-10-CM | POA: Diagnosis not present

## 2018-02-06 DIAGNOSIS — N2581 Secondary hyperparathyroidism of renal origin: Secondary | ICD-10-CM | POA: Diagnosis not present

## 2018-02-06 DIAGNOSIS — D509 Iron deficiency anemia, unspecified: Secondary | ICD-10-CM | POA: Diagnosis not present

## 2018-02-08 DIAGNOSIS — I132 Hypertensive heart and chronic kidney disease with heart failure and with stage 5 chronic kidney disease, or end stage renal disease: Secondary | ICD-10-CM | POA: Diagnosis not present

## 2018-02-08 DIAGNOSIS — I5032 Chronic diastolic (congestive) heart failure: Secondary | ICD-10-CM | POA: Diagnosis not present

## 2018-02-08 DIAGNOSIS — J449 Chronic obstructive pulmonary disease, unspecified: Secondary | ICD-10-CM | POA: Diagnosis not present

## 2018-02-08 DIAGNOSIS — N186 End stage renal disease: Secondary | ICD-10-CM | POA: Diagnosis not present

## 2018-02-08 DIAGNOSIS — Z992 Dependence on renal dialysis: Secondary | ICD-10-CM | POA: Diagnosis not present

## 2018-02-08 DIAGNOSIS — E1122 Type 2 diabetes mellitus with diabetic chronic kidney disease: Secondary | ICD-10-CM | POA: Diagnosis not present

## 2018-02-09 DIAGNOSIS — N2581 Secondary hyperparathyroidism of renal origin: Secondary | ICD-10-CM | POA: Diagnosis not present

## 2018-02-09 DIAGNOSIS — N186 End stage renal disease: Secondary | ICD-10-CM | POA: Diagnosis not present

## 2018-02-09 DIAGNOSIS — D509 Iron deficiency anemia, unspecified: Secondary | ICD-10-CM | POA: Diagnosis not present

## 2018-02-10 DIAGNOSIS — I132 Hypertensive heart and chronic kidney disease with heart failure and with stage 5 chronic kidney disease, or end stage renal disease: Secondary | ICD-10-CM | POA: Diagnosis not present

## 2018-02-10 DIAGNOSIS — I5032 Chronic diastolic (congestive) heart failure: Secondary | ICD-10-CM | POA: Diagnosis not present

## 2018-02-10 DIAGNOSIS — E1122 Type 2 diabetes mellitus with diabetic chronic kidney disease: Secondary | ICD-10-CM | POA: Diagnosis not present

## 2018-02-10 DIAGNOSIS — J449 Chronic obstructive pulmonary disease, unspecified: Secondary | ICD-10-CM | POA: Diagnosis not present

## 2018-02-10 DIAGNOSIS — N186 End stage renal disease: Secondary | ICD-10-CM | POA: Diagnosis not present

## 2018-02-10 DIAGNOSIS — Z992 Dependence on renal dialysis: Secondary | ICD-10-CM | POA: Diagnosis not present

## 2018-02-11 DIAGNOSIS — D509 Iron deficiency anemia, unspecified: Secondary | ICD-10-CM | POA: Diagnosis not present

## 2018-02-11 DIAGNOSIS — N186 End stage renal disease: Secondary | ICD-10-CM | POA: Diagnosis not present

## 2018-02-11 DIAGNOSIS — N2581 Secondary hyperparathyroidism of renal origin: Secondary | ICD-10-CM | POA: Diagnosis not present

## 2018-02-13 DIAGNOSIS — N2581 Secondary hyperparathyroidism of renal origin: Secondary | ICD-10-CM | POA: Diagnosis not present

## 2018-02-13 DIAGNOSIS — N186 End stage renal disease: Secondary | ICD-10-CM | POA: Diagnosis not present

## 2018-02-13 DIAGNOSIS — D509 Iron deficiency anemia, unspecified: Secondary | ICD-10-CM | POA: Diagnosis not present

## 2018-02-16 DIAGNOSIS — D509 Iron deficiency anemia, unspecified: Secondary | ICD-10-CM | POA: Diagnosis not present

## 2018-02-16 DIAGNOSIS — N2581 Secondary hyperparathyroidism of renal origin: Secondary | ICD-10-CM | POA: Diagnosis not present

## 2018-02-16 DIAGNOSIS — N186 End stage renal disease: Secondary | ICD-10-CM | POA: Diagnosis not present

## 2018-02-17 DIAGNOSIS — I132 Hypertensive heart and chronic kidney disease with heart failure and with stage 5 chronic kidney disease, or end stage renal disease: Secondary | ICD-10-CM | POA: Diagnosis not present

## 2018-02-17 DIAGNOSIS — J449 Chronic obstructive pulmonary disease, unspecified: Secondary | ICD-10-CM | POA: Diagnosis not present

## 2018-02-17 DIAGNOSIS — N186 End stage renal disease: Secondary | ICD-10-CM | POA: Diagnosis not present

## 2018-02-17 DIAGNOSIS — I5032 Chronic diastolic (congestive) heart failure: Secondary | ICD-10-CM | POA: Diagnosis not present

## 2018-02-17 DIAGNOSIS — E1122 Type 2 diabetes mellitus with diabetic chronic kidney disease: Secondary | ICD-10-CM | POA: Diagnosis not present

## 2018-02-17 DIAGNOSIS — Z992 Dependence on renal dialysis: Secondary | ICD-10-CM | POA: Diagnosis not present

## 2018-02-18 DIAGNOSIS — D509 Iron deficiency anemia, unspecified: Secondary | ICD-10-CM | POA: Diagnosis not present

## 2018-02-18 DIAGNOSIS — N2581 Secondary hyperparathyroidism of renal origin: Secondary | ICD-10-CM | POA: Diagnosis not present

## 2018-02-18 DIAGNOSIS — N186 End stage renal disease: Secondary | ICD-10-CM | POA: Diagnosis not present

## 2018-02-20 DIAGNOSIS — N2581 Secondary hyperparathyroidism of renal origin: Secondary | ICD-10-CM | POA: Diagnosis not present

## 2018-02-20 DIAGNOSIS — D509 Iron deficiency anemia, unspecified: Secondary | ICD-10-CM | POA: Diagnosis not present

## 2018-02-20 DIAGNOSIS — N186 End stage renal disease: Secondary | ICD-10-CM | POA: Diagnosis not present

## 2018-02-22 DIAGNOSIS — Z992 Dependence on renal dialysis: Secondary | ICD-10-CM | POA: Diagnosis not present

## 2018-02-22 DIAGNOSIS — N186 End stage renal disease: Secondary | ICD-10-CM | POA: Diagnosis not present

## 2018-02-22 DIAGNOSIS — E1122 Type 2 diabetes mellitus with diabetic chronic kidney disease: Secondary | ICD-10-CM | POA: Diagnosis not present

## 2018-02-23 DIAGNOSIS — N2581 Secondary hyperparathyroidism of renal origin: Secondary | ICD-10-CM | POA: Diagnosis not present

## 2018-02-23 DIAGNOSIS — E1121 Type 2 diabetes mellitus with diabetic nephropathy: Secondary | ICD-10-CM | POA: Diagnosis not present

## 2018-02-23 DIAGNOSIS — N186 End stage renal disease: Secondary | ICD-10-CM | POA: Diagnosis not present

## 2018-02-24 DIAGNOSIS — Z992 Dependence on renal dialysis: Secondary | ICD-10-CM | POA: Diagnosis not present

## 2018-02-24 DIAGNOSIS — J449 Chronic obstructive pulmonary disease, unspecified: Secondary | ICD-10-CM | POA: Diagnosis not present

## 2018-02-24 DIAGNOSIS — N186 End stage renal disease: Secondary | ICD-10-CM | POA: Diagnosis not present

## 2018-02-24 DIAGNOSIS — I132 Hypertensive heart and chronic kidney disease with heart failure and with stage 5 chronic kidney disease, or end stage renal disease: Secondary | ICD-10-CM | POA: Diagnosis not present

## 2018-02-24 DIAGNOSIS — I5032 Chronic diastolic (congestive) heart failure: Secondary | ICD-10-CM | POA: Diagnosis not present

## 2018-02-24 DIAGNOSIS — E1122 Type 2 diabetes mellitus with diabetic chronic kidney disease: Secondary | ICD-10-CM | POA: Diagnosis not present

## 2018-02-25 DIAGNOSIS — N186 End stage renal disease: Secondary | ICD-10-CM | POA: Diagnosis not present

## 2018-02-25 DIAGNOSIS — N2581 Secondary hyperparathyroidism of renal origin: Secondary | ICD-10-CM | POA: Diagnosis not present

## 2018-02-25 DIAGNOSIS — E1121 Type 2 diabetes mellitus with diabetic nephropathy: Secondary | ICD-10-CM | POA: Diagnosis not present

## 2018-02-27 DIAGNOSIS — N186 End stage renal disease: Secondary | ICD-10-CM | POA: Diagnosis not present

## 2018-02-27 DIAGNOSIS — E1121 Type 2 diabetes mellitus with diabetic nephropathy: Secondary | ICD-10-CM | POA: Diagnosis not present

## 2018-02-27 DIAGNOSIS — N2581 Secondary hyperparathyroidism of renal origin: Secondary | ICD-10-CM | POA: Diagnosis not present

## 2018-03-02 DIAGNOSIS — N186 End stage renal disease: Secondary | ICD-10-CM | POA: Diagnosis not present

## 2018-03-02 DIAGNOSIS — N2581 Secondary hyperparathyroidism of renal origin: Secondary | ICD-10-CM | POA: Diagnosis not present

## 2018-03-02 DIAGNOSIS — E1121 Type 2 diabetes mellitus with diabetic nephropathy: Secondary | ICD-10-CM | POA: Diagnosis not present

## 2018-03-03 DIAGNOSIS — E1122 Type 2 diabetes mellitus with diabetic chronic kidney disease: Secondary | ICD-10-CM | POA: Diagnosis not present

## 2018-03-03 DIAGNOSIS — I5032 Chronic diastolic (congestive) heart failure: Secondary | ICD-10-CM | POA: Diagnosis not present

## 2018-03-03 DIAGNOSIS — N186 End stage renal disease: Secondary | ICD-10-CM | POA: Diagnosis not present

## 2018-03-03 DIAGNOSIS — I132 Hypertensive heart and chronic kidney disease with heart failure and with stage 5 chronic kidney disease, or end stage renal disease: Secondary | ICD-10-CM | POA: Diagnosis not present

## 2018-03-03 DIAGNOSIS — Z992 Dependence on renal dialysis: Secondary | ICD-10-CM | POA: Diagnosis not present

## 2018-03-03 DIAGNOSIS — J449 Chronic obstructive pulmonary disease, unspecified: Secondary | ICD-10-CM | POA: Diagnosis not present

## 2018-03-04 DIAGNOSIS — N186 End stage renal disease: Secondary | ICD-10-CM | POA: Diagnosis not present

## 2018-03-04 DIAGNOSIS — N2581 Secondary hyperparathyroidism of renal origin: Secondary | ICD-10-CM | POA: Diagnosis not present

## 2018-03-04 DIAGNOSIS — E1121 Type 2 diabetes mellitus with diabetic nephropathy: Secondary | ICD-10-CM | POA: Diagnosis not present

## 2018-03-06 DIAGNOSIS — E1121 Type 2 diabetes mellitus with diabetic nephropathy: Secondary | ICD-10-CM | POA: Diagnosis not present

## 2018-03-06 DIAGNOSIS — N186 End stage renal disease: Secondary | ICD-10-CM | POA: Diagnosis not present

## 2018-03-06 DIAGNOSIS — N2581 Secondary hyperparathyroidism of renal origin: Secondary | ICD-10-CM | POA: Diagnosis not present

## 2018-03-09 DIAGNOSIS — N186 End stage renal disease: Secondary | ICD-10-CM | POA: Diagnosis not present

## 2018-03-09 DIAGNOSIS — E1121 Type 2 diabetes mellitus with diabetic nephropathy: Secondary | ICD-10-CM | POA: Diagnosis not present

## 2018-03-09 DIAGNOSIS — N2581 Secondary hyperparathyroidism of renal origin: Secondary | ICD-10-CM | POA: Diagnosis not present

## 2018-03-10 DIAGNOSIS — E1122 Type 2 diabetes mellitus with diabetic chronic kidney disease: Secondary | ICD-10-CM | POA: Diagnosis not present

## 2018-03-10 DIAGNOSIS — Z992 Dependence on renal dialysis: Secondary | ICD-10-CM | POA: Diagnosis not present

## 2018-03-10 DIAGNOSIS — J449 Chronic obstructive pulmonary disease, unspecified: Secondary | ICD-10-CM | POA: Diagnosis not present

## 2018-03-10 DIAGNOSIS — I132 Hypertensive heart and chronic kidney disease with heart failure and with stage 5 chronic kidney disease, or end stage renal disease: Secondary | ICD-10-CM | POA: Diagnosis not present

## 2018-03-10 DIAGNOSIS — I5032 Chronic diastolic (congestive) heart failure: Secondary | ICD-10-CM | POA: Diagnosis not present

## 2018-03-10 DIAGNOSIS — N186 End stage renal disease: Secondary | ICD-10-CM | POA: Diagnosis not present

## 2018-03-11 DIAGNOSIS — E1121 Type 2 diabetes mellitus with diabetic nephropathy: Secondary | ICD-10-CM | POA: Diagnosis not present

## 2018-03-11 DIAGNOSIS — N186 End stage renal disease: Secondary | ICD-10-CM | POA: Diagnosis not present

## 2018-03-11 DIAGNOSIS — N2581 Secondary hyperparathyroidism of renal origin: Secondary | ICD-10-CM | POA: Diagnosis not present

## 2018-03-13 DIAGNOSIS — E1121 Type 2 diabetes mellitus with diabetic nephropathy: Secondary | ICD-10-CM | POA: Diagnosis not present

## 2018-03-13 DIAGNOSIS — N2581 Secondary hyperparathyroidism of renal origin: Secondary | ICD-10-CM | POA: Diagnosis not present

## 2018-03-13 DIAGNOSIS — N186 End stage renal disease: Secondary | ICD-10-CM | POA: Diagnosis not present

## 2018-03-16 DIAGNOSIS — E1121 Type 2 diabetes mellitus with diabetic nephropathy: Secondary | ICD-10-CM | POA: Diagnosis not present

## 2018-03-16 DIAGNOSIS — N2581 Secondary hyperparathyroidism of renal origin: Secondary | ICD-10-CM | POA: Diagnosis not present

## 2018-03-16 DIAGNOSIS — N186 End stage renal disease: Secondary | ICD-10-CM | POA: Diagnosis not present

## 2018-03-17 DIAGNOSIS — I447 Left bundle-branch block, unspecified: Secondary | ICD-10-CM | POA: Diagnosis not present

## 2018-03-17 DIAGNOSIS — N186 End stage renal disease: Secondary | ICD-10-CM | POA: Diagnosis not present

## 2018-03-17 DIAGNOSIS — I120 Hypertensive chronic kidney disease with stage 5 chronic kidney disease or end stage renal disease: Secondary | ICD-10-CM | POA: Diagnosis not present

## 2018-03-17 DIAGNOSIS — Z992 Dependence on renal dialysis: Secondary | ICD-10-CM | POA: Diagnosis not present

## 2018-03-17 DIAGNOSIS — I2510 Atherosclerotic heart disease of native coronary artery without angina pectoris: Secondary | ICD-10-CM | POA: Diagnosis not present

## 2018-03-18 DIAGNOSIS — N2581 Secondary hyperparathyroidism of renal origin: Secondary | ICD-10-CM | POA: Diagnosis not present

## 2018-03-18 DIAGNOSIS — E1121 Type 2 diabetes mellitus with diabetic nephropathy: Secondary | ICD-10-CM | POA: Diagnosis not present

## 2018-03-18 DIAGNOSIS — N186 End stage renal disease: Secondary | ICD-10-CM | POA: Diagnosis not present

## 2018-03-20 DIAGNOSIS — E1121 Type 2 diabetes mellitus with diabetic nephropathy: Secondary | ICD-10-CM | POA: Diagnosis not present

## 2018-03-20 DIAGNOSIS — N2581 Secondary hyperparathyroidism of renal origin: Secondary | ICD-10-CM | POA: Diagnosis not present

## 2018-03-20 DIAGNOSIS — N186 End stage renal disease: Secondary | ICD-10-CM | POA: Diagnosis not present

## 2018-03-23 DIAGNOSIS — N186 End stage renal disease: Secondary | ICD-10-CM | POA: Diagnosis not present

## 2018-03-23 DIAGNOSIS — N2581 Secondary hyperparathyroidism of renal origin: Secondary | ICD-10-CM | POA: Diagnosis not present

## 2018-03-23 DIAGNOSIS — E1121 Type 2 diabetes mellitus with diabetic nephropathy: Secondary | ICD-10-CM | POA: Diagnosis not present

## 2018-03-24 DIAGNOSIS — E1122 Type 2 diabetes mellitus with diabetic chronic kidney disease: Secondary | ICD-10-CM | POA: Diagnosis not present

## 2018-03-24 DIAGNOSIS — Z992 Dependence on renal dialysis: Secondary | ICD-10-CM | POA: Diagnosis not present

## 2018-03-24 DIAGNOSIS — N186 End stage renal disease: Secondary | ICD-10-CM | POA: Diagnosis not present

## 2018-03-25 DIAGNOSIS — E1121 Type 2 diabetes mellitus with diabetic nephropathy: Secondary | ICD-10-CM | POA: Diagnosis not present

## 2018-03-25 DIAGNOSIS — N186 End stage renal disease: Secondary | ICD-10-CM | POA: Diagnosis not present

## 2018-03-25 DIAGNOSIS — N2581 Secondary hyperparathyroidism of renal origin: Secondary | ICD-10-CM | POA: Diagnosis not present

## 2018-03-27 DIAGNOSIS — N186 End stage renal disease: Secondary | ICD-10-CM | POA: Diagnosis not present

## 2018-03-27 DIAGNOSIS — N2581 Secondary hyperparathyroidism of renal origin: Secondary | ICD-10-CM | POA: Diagnosis not present

## 2018-03-27 DIAGNOSIS — E1121 Type 2 diabetes mellitus with diabetic nephropathy: Secondary | ICD-10-CM | POA: Diagnosis not present

## 2018-03-30 DIAGNOSIS — E1121 Type 2 diabetes mellitus with diabetic nephropathy: Secondary | ICD-10-CM | POA: Diagnosis not present

## 2018-03-30 DIAGNOSIS — N2581 Secondary hyperparathyroidism of renal origin: Secondary | ICD-10-CM | POA: Diagnosis not present

## 2018-03-30 DIAGNOSIS — N186 End stage renal disease: Secondary | ICD-10-CM | POA: Diagnosis not present

## 2018-04-01 DIAGNOSIS — N186 End stage renal disease: Secondary | ICD-10-CM | POA: Diagnosis not present

## 2018-04-01 DIAGNOSIS — N2581 Secondary hyperparathyroidism of renal origin: Secondary | ICD-10-CM | POA: Diagnosis not present

## 2018-04-01 DIAGNOSIS — E1121 Type 2 diabetes mellitus with diabetic nephropathy: Secondary | ICD-10-CM | POA: Diagnosis not present

## 2018-04-03 DIAGNOSIS — N186 End stage renal disease: Secondary | ICD-10-CM | POA: Diagnosis not present

## 2018-04-03 DIAGNOSIS — N2581 Secondary hyperparathyroidism of renal origin: Secondary | ICD-10-CM | POA: Diagnosis not present

## 2018-04-03 DIAGNOSIS — E1121 Type 2 diabetes mellitus with diabetic nephropathy: Secondary | ICD-10-CM | POA: Diagnosis not present

## 2018-04-06 DIAGNOSIS — E1121 Type 2 diabetes mellitus with diabetic nephropathy: Secondary | ICD-10-CM | POA: Diagnosis not present

## 2018-04-06 DIAGNOSIS — N2581 Secondary hyperparathyroidism of renal origin: Secondary | ICD-10-CM | POA: Diagnosis not present

## 2018-04-06 DIAGNOSIS — N186 End stage renal disease: Secondary | ICD-10-CM | POA: Diagnosis not present

## 2018-04-07 DIAGNOSIS — M5126 Other intervertebral disc displacement, lumbar region: Secondary | ICD-10-CM | POA: Diagnosis not present

## 2018-04-07 DIAGNOSIS — M4726 Other spondylosis with radiculopathy, lumbar region: Secondary | ICD-10-CM | POA: Diagnosis not present

## 2018-04-08 DIAGNOSIS — N186 End stage renal disease: Secondary | ICD-10-CM | POA: Diagnosis not present

## 2018-04-08 DIAGNOSIS — E1121 Type 2 diabetes mellitus with diabetic nephropathy: Secondary | ICD-10-CM | POA: Diagnosis not present

## 2018-04-08 DIAGNOSIS — N2581 Secondary hyperparathyroidism of renal origin: Secondary | ICD-10-CM | POA: Diagnosis not present

## 2018-04-10 DIAGNOSIS — E1121 Type 2 diabetes mellitus with diabetic nephropathy: Secondary | ICD-10-CM | POA: Diagnosis not present

## 2018-04-10 DIAGNOSIS — N186 End stage renal disease: Secondary | ICD-10-CM | POA: Diagnosis not present

## 2018-04-10 DIAGNOSIS — N2581 Secondary hyperparathyroidism of renal origin: Secondary | ICD-10-CM | POA: Diagnosis not present

## 2018-04-13 DIAGNOSIS — N2581 Secondary hyperparathyroidism of renal origin: Secondary | ICD-10-CM | POA: Diagnosis not present

## 2018-04-13 DIAGNOSIS — N186 End stage renal disease: Secondary | ICD-10-CM | POA: Diagnosis not present

## 2018-04-13 DIAGNOSIS — E1121 Type 2 diabetes mellitus with diabetic nephropathy: Secondary | ICD-10-CM | POA: Diagnosis not present

## 2018-04-14 DIAGNOSIS — M5126 Other intervertebral disc displacement, lumbar region: Secondary | ICD-10-CM | POA: Diagnosis not present

## 2018-04-14 DIAGNOSIS — M48061 Spinal stenosis, lumbar region without neurogenic claudication: Secondary | ICD-10-CM | POA: Diagnosis not present

## 2018-04-15 DIAGNOSIS — E1121 Type 2 diabetes mellitus with diabetic nephropathy: Secondary | ICD-10-CM | POA: Diagnosis not present

## 2018-04-15 DIAGNOSIS — N186 End stage renal disease: Secondary | ICD-10-CM | POA: Diagnosis not present

## 2018-04-15 DIAGNOSIS — N2581 Secondary hyperparathyroidism of renal origin: Secondary | ICD-10-CM | POA: Diagnosis not present

## 2018-04-17 DIAGNOSIS — N186 End stage renal disease: Secondary | ICD-10-CM | POA: Diagnosis not present

## 2018-04-17 DIAGNOSIS — N2581 Secondary hyperparathyroidism of renal origin: Secondary | ICD-10-CM | POA: Diagnosis not present

## 2018-04-17 DIAGNOSIS — E1121 Type 2 diabetes mellitus with diabetic nephropathy: Secondary | ICD-10-CM | POA: Diagnosis not present

## 2018-04-20 DIAGNOSIS — N186 End stage renal disease: Secondary | ICD-10-CM | POA: Diagnosis not present

## 2018-04-20 DIAGNOSIS — E1121 Type 2 diabetes mellitus with diabetic nephropathy: Secondary | ICD-10-CM | POA: Diagnosis not present

## 2018-04-20 DIAGNOSIS — N2581 Secondary hyperparathyroidism of renal origin: Secondary | ICD-10-CM | POA: Diagnosis not present

## 2018-04-22 DIAGNOSIS — E1121 Type 2 diabetes mellitus with diabetic nephropathy: Secondary | ICD-10-CM | POA: Diagnosis not present

## 2018-04-22 DIAGNOSIS — N2581 Secondary hyperparathyroidism of renal origin: Secondary | ICD-10-CM | POA: Diagnosis not present

## 2018-04-22 DIAGNOSIS — N186 End stage renal disease: Secondary | ICD-10-CM | POA: Diagnosis not present

## 2018-04-24 DIAGNOSIS — N2581 Secondary hyperparathyroidism of renal origin: Secondary | ICD-10-CM | POA: Diagnosis not present

## 2018-04-24 DIAGNOSIS — E1122 Type 2 diabetes mellitus with diabetic chronic kidney disease: Secondary | ICD-10-CM | POA: Diagnosis not present

## 2018-04-24 DIAGNOSIS — Z992 Dependence on renal dialysis: Secondary | ICD-10-CM | POA: Diagnosis not present

## 2018-04-24 DIAGNOSIS — E1121 Type 2 diabetes mellitus with diabetic nephropathy: Secondary | ICD-10-CM | POA: Diagnosis not present

## 2018-04-24 DIAGNOSIS — N186 End stage renal disease: Secondary | ICD-10-CM | POA: Diagnosis not present

## 2018-04-27 DIAGNOSIS — N2581 Secondary hyperparathyroidism of renal origin: Secondary | ICD-10-CM | POA: Diagnosis not present

## 2018-04-27 DIAGNOSIS — E1121 Type 2 diabetes mellitus with diabetic nephropathy: Secondary | ICD-10-CM | POA: Diagnosis not present

## 2018-04-27 DIAGNOSIS — N186 End stage renal disease: Secondary | ICD-10-CM | POA: Diagnosis not present

## 2018-04-29 DIAGNOSIS — N2581 Secondary hyperparathyroidism of renal origin: Secondary | ICD-10-CM | POA: Diagnosis not present

## 2018-04-29 DIAGNOSIS — E1121 Type 2 diabetes mellitus with diabetic nephropathy: Secondary | ICD-10-CM | POA: Diagnosis not present

## 2018-04-29 DIAGNOSIS — N186 End stage renal disease: Secondary | ICD-10-CM | POA: Diagnosis not present

## 2018-04-29 IMAGING — DX DG CHEST 2V
2 series · 2 of 2 positions shown · non-contrast
Comparison: None.

CLINICAL DATA: PT MOVED HERE YESTERDAY FROM [place] AND WAS TOLD TO
COME HERE TO RECEIVE DIALYSIS. THE PHYSICIANS WERE NOT AWARE OF HIS
ARRIVAL.NO CHEST COMPLAINTS, SLIGHT COUGH CAUSED BY ALLERGIES HX OF
DM- ON MEDS, HTN-ON MEDS

EXAM:
CHEST  2 VIEW

[chest lat]
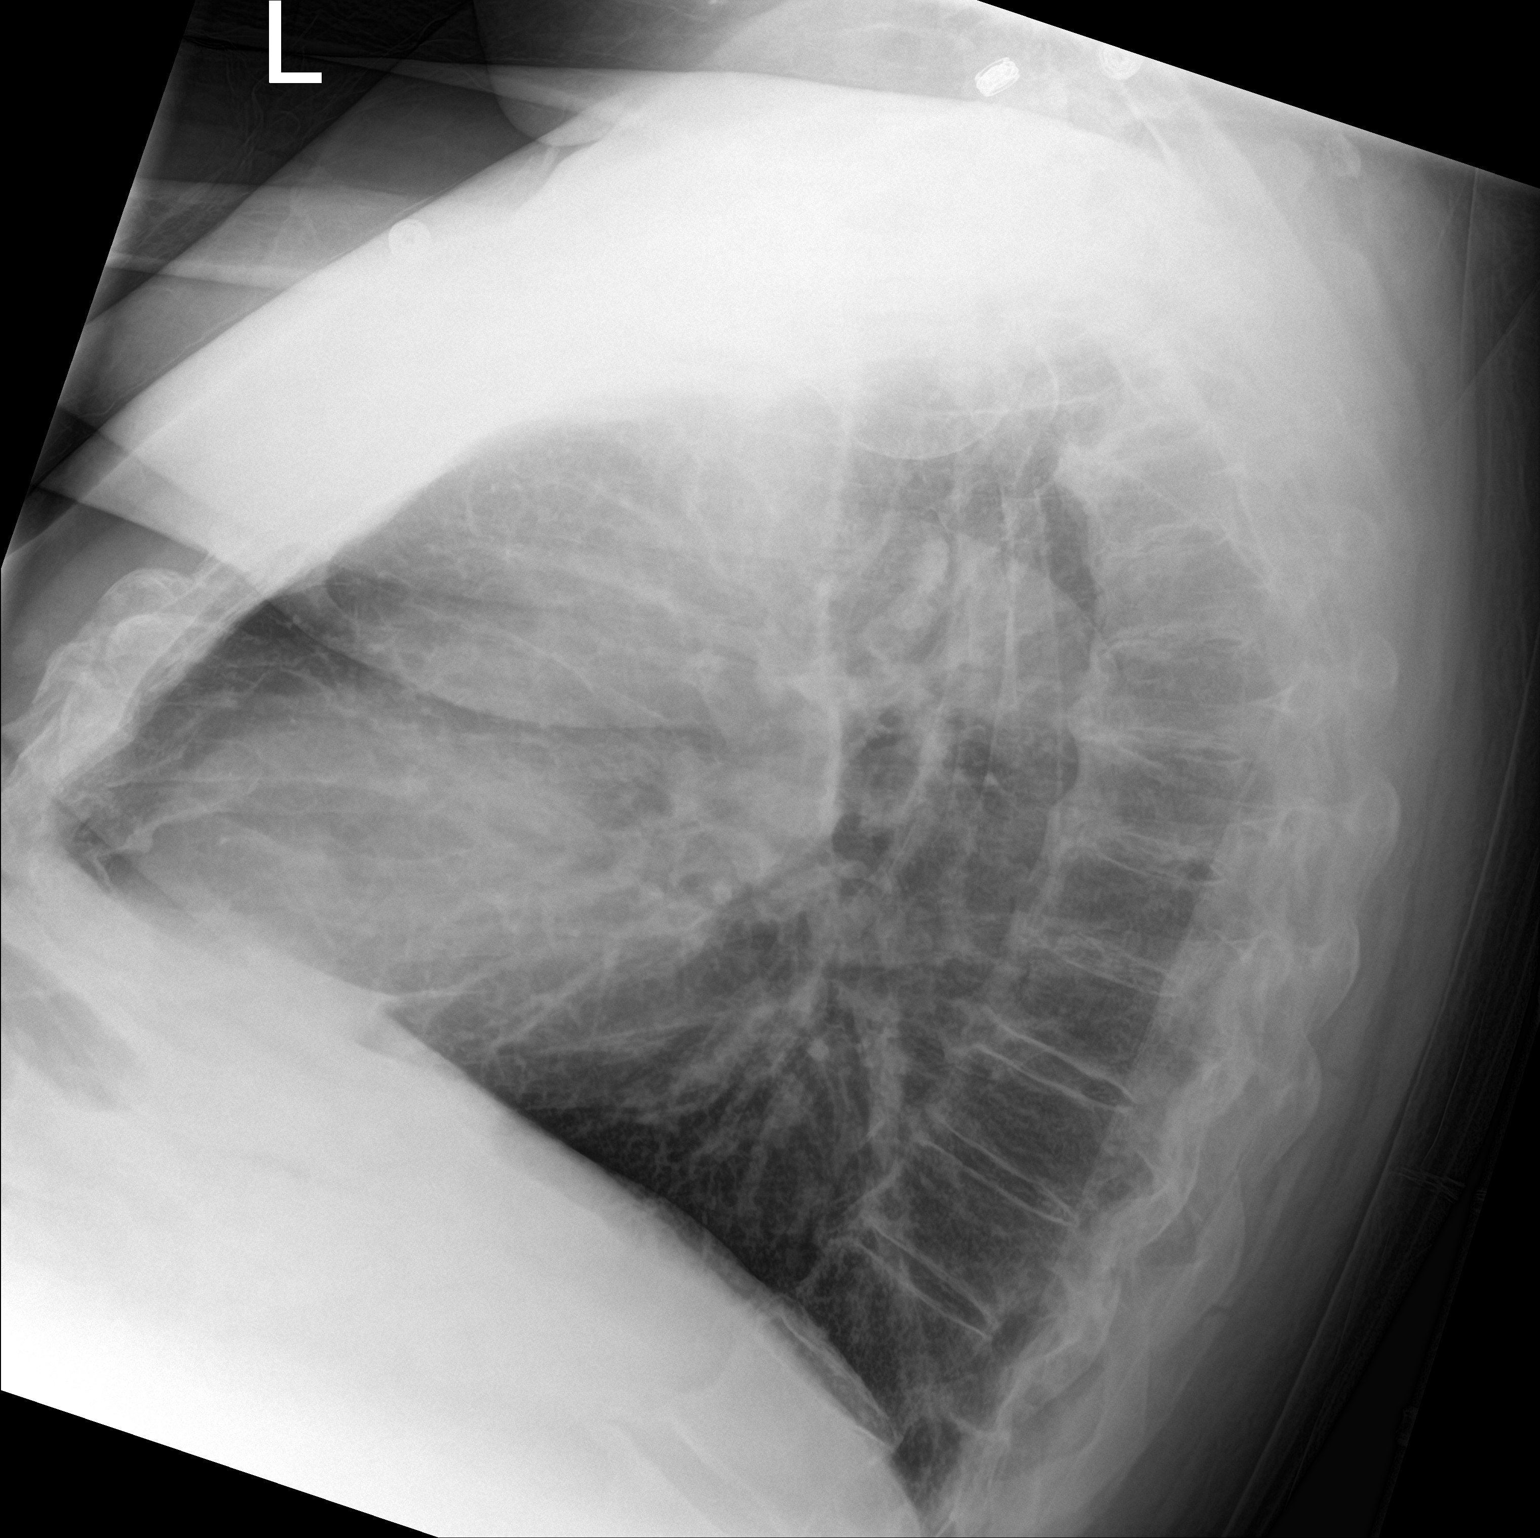

[chest ap]
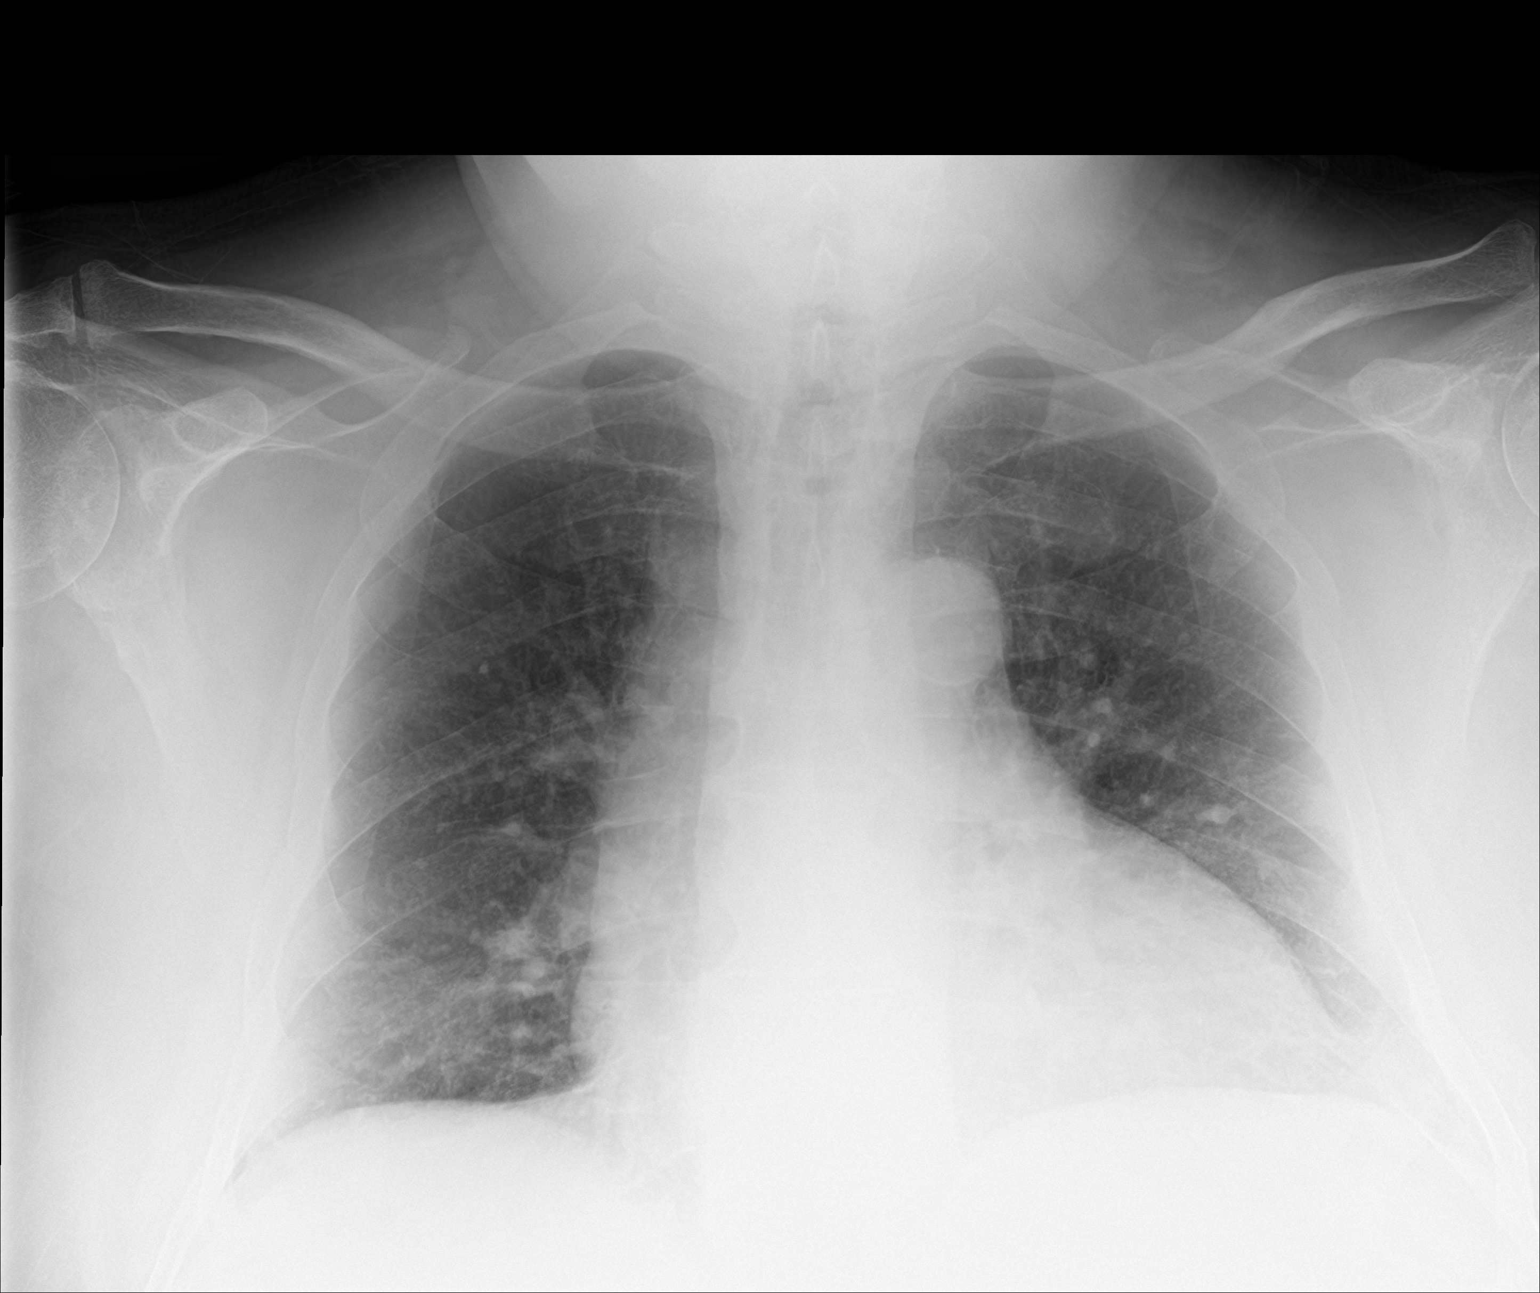

[2 of 2 positions shown; findings below may reference images not displayed]

FINDINGS: Cardiac silhouette is borderline enlarged. No mediastinal or hilar
masses or convincing adenopathy.

Clear lungs.  No pleural effusion or pneumothorax.

Bony thorax is demineralized but intact.
IMPRESSION: No acute cardiopulmonary disease.

## 2018-05-01 DIAGNOSIS — N186 End stage renal disease: Secondary | ICD-10-CM | POA: Diagnosis not present

## 2018-05-01 DIAGNOSIS — N2581 Secondary hyperparathyroidism of renal origin: Secondary | ICD-10-CM | POA: Diagnosis not present

## 2018-05-01 DIAGNOSIS — E1121 Type 2 diabetes mellitus with diabetic nephropathy: Secondary | ICD-10-CM | POA: Diagnosis not present

## 2018-05-04 DIAGNOSIS — N2581 Secondary hyperparathyroidism of renal origin: Secondary | ICD-10-CM | POA: Diagnosis not present

## 2018-05-04 DIAGNOSIS — N186 End stage renal disease: Secondary | ICD-10-CM | POA: Diagnosis not present

## 2018-05-04 DIAGNOSIS — E1121 Type 2 diabetes mellitus with diabetic nephropathy: Secondary | ICD-10-CM | POA: Diagnosis not present

## 2018-05-06 DIAGNOSIS — N186 End stage renal disease: Secondary | ICD-10-CM | POA: Diagnosis not present

## 2018-05-06 DIAGNOSIS — E1121 Type 2 diabetes mellitus with diabetic nephropathy: Secondary | ICD-10-CM | POA: Diagnosis not present

## 2018-05-06 DIAGNOSIS — N2581 Secondary hyperparathyroidism of renal origin: Secondary | ICD-10-CM | POA: Diagnosis not present

## 2018-05-06 DIAGNOSIS — S82122D Displaced fracture of lateral condyle of left tibia, subsequent encounter for closed fracture with routine healing: Secondary | ICD-10-CM | POA: Diagnosis not present

## 2018-05-06 DIAGNOSIS — M1712 Unilateral primary osteoarthritis, left knee: Secondary | ICD-10-CM | POA: Diagnosis not present

## 2018-05-08 DIAGNOSIS — N186 End stage renal disease: Secondary | ICD-10-CM | POA: Diagnosis not present

## 2018-05-08 DIAGNOSIS — E1121 Type 2 diabetes mellitus with diabetic nephropathy: Secondary | ICD-10-CM | POA: Diagnosis not present

## 2018-05-08 DIAGNOSIS — N2581 Secondary hyperparathyroidism of renal origin: Secondary | ICD-10-CM | POA: Diagnosis not present

## 2018-05-11 DIAGNOSIS — N186 End stage renal disease: Secondary | ICD-10-CM | POA: Diagnosis not present

## 2018-05-11 DIAGNOSIS — N2581 Secondary hyperparathyroidism of renal origin: Secondary | ICD-10-CM | POA: Diagnosis not present

## 2018-05-11 DIAGNOSIS — E1121 Type 2 diabetes mellitus with diabetic nephropathy: Secondary | ICD-10-CM | POA: Diagnosis not present

## 2018-05-13 DIAGNOSIS — N186 End stage renal disease: Secondary | ICD-10-CM | POA: Diagnosis not present

## 2018-05-13 DIAGNOSIS — E1121 Type 2 diabetes mellitus with diabetic nephropathy: Secondary | ICD-10-CM | POA: Diagnosis not present

## 2018-05-13 DIAGNOSIS — N2581 Secondary hyperparathyroidism of renal origin: Secondary | ICD-10-CM | POA: Diagnosis not present

## 2018-05-15 DIAGNOSIS — E1121 Type 2 diabetes mellitus with diabetic nephropathy: Secondary | ICD-10-CM | POA: Diagnosis not present

## 2018-05-15 DIAGNOSIS — N2581 Secondary hyperparathyroidism of renal origin: Secondary | ICD-10-CM | POA: Diagnosis not present

## 2018-05-15 DIAGNOSIS — N186 End stage renal disease: Secondary | ICD-10-CM | POA: Diagnosis not present

## 2018-05-18 DIAGNOSIS — N2581 Secondary hyperparathyroidism of renal origin: Secondary | ICD-10-CM | POA: Diagnosis not present

## 2018-05-18 DIAGNOSIS — N186 End stage renal disease: Secondary | ICD-10-CM | POA: Diagnosis not present

## 2018-05-18 DIAGNOSIS — E1121 Type 2 diabetes mellitus with diabetic nephropathy: Secondary | ICD-10-CM | POA: Diagnosis not present

## 2018-05-20 DIAGNOSIS — N2581 Secondary hyperparathyroidism of renal origin: Secondary | ICD-10-CM | POA: Diagnosis not present

## 2018-05-20 DIAGNOSIS — N186 End stage renal disease: Secondary | ICD-10-CM | POA: Diagnosis not present

## 2018-05-20 DIAGNOSIS — E1121 Type 2 diabetes mellitus with diabetic nephropathy: Secondary | ICD-10-CM | POA: Diagnosis not present

## 2018-05-21 DIAGNOSIS — H31011 Macula scars of posterior pole (postinflammatory) (post-traumatic), right eye: Secondary | ICD-10-CM | POA: Diagnosis not present

## 2018-05-21 DIAGNOSIS — E113593 Type 2 diabetes mellitus with proliferative diabetic retinopathy without macular edema, bilateral: Secondary | ICD-10-CM | POA: Diagnosis not present

## 2018-05-21 DIAGNOSIS — H3582 Retinal ischemia: Secondary | ICD-10-CM | POA: Diagnosis not present

## 2018-05-22 DIAGNOSIS — E1121 Type 2 diabetes mellitus with diabetic nephropathy: Secondary | ICD-10-CM | POA: Diagnosis not present

## 2018-05-22 DIAGNOSIS — N2581 Secondary hyperparathyroidism of renal origin: Secondary | ICD-10-CM | POA: Diagnosis not present

## 2018-05-22 DIAGNOSIS — N186 End stage renal disease: Secondary | ICD-10-CM | POA: Diagnosis not present

## 2018-05-24 DIAGNOSIS — Z992 Dependence on renal dialysis: Secondary | ICD-10-CM | POA: Diagnosis not present

## 2018-05-24 DIAGNOSIS — E1122 Type 2 diabetes mellitus with diabetic chronic kidney disease: Secondary | ICD-10-CM | POA: Diagnosis not present

## 2018-05-24 DIAGNOSIS — N186 End stage renal disease: Secondary | ICD-10-CM | POA: Diagnosis not present

## 2018-05-25 DIAGNOSIS — N186 End stage renal disease: Secondary | ICD-10-CM | POA: Diagnosis not present

## 2018-05-25 DIAGNOSIS — E1121 Type 2 diabetes mellitus with diabetic nephropathy: Secondary | ICD-10-CM | POA: Diagnosis not present

## 2018-05-25 DIAGNOSIS — N2581 Secondary hyperparathyroidism of renal origin: Secondary | ICD-10-CM | POA: Diagnosis not present

## 2018-05-25 DIAGNOSIS — E875 Hyperkalemia: Secondary | ICD-10-CM | POA: Diagnosis not present

## 2018-05-27 DIAGNOSIS — N2581 Secondary hyperparathyroidism of renal origin: Secondary | ICD-10-CM | POA: Diagnosis not present

## 2018-05-27 DIAGNOSIS — E875 Hyperkalemia: Secondary | ICD-10-CM | POA: Diagnosis not present

## 2018-05-27 DIAGNOSIS — N186 End stage renal disease: Secondary | ICD-10-CM | POA: Diagnosis not present

## 2018-05-27 DIAGNOSIS — E1121 Type 2 diabetes mellitus with diabetic nephropathy: Secondary | ICD-10-CM | POA: Diagnosis not present

## 2018-05-29 DIAGNOSIS — E1121 Type 2 diabetes mellitus with diabetic nephropathy: Secondary | ICD-10-CM | POA: Diagnosis not present

## 2018-05-29 DIAGNOSIS — E875 Hyperkalemia: Secondary | ICD-10-CM | POA: Diagnosis not present

## 2018-05-29 DIAGNOSIS — N186 End stage renal disease: Secondary | ICD-10-CM | POA: Diagnosis not present

## 2018-05-29 DIAGNOSIS — N2581 Secondary hyperparathyroidism of renal origin: Secondary | ICD-10-CM | POA: Diagnosis not present

## 2018-06-01 DIAGNOSIS — N186 End stage renal disease: Secondary | ICD-10-CM | POA: Diagnosis not present

## 2018-06-01 DIAGNOSIS — N2581 Secondary hyperparathyroidism of renal origin: Secondary | ICD-10-CM | POA: Diagnosis not present

## 2018-06-01 DIAGNOSIS — E1121 Type 2 diabetes mellitus with diabetic nephropathy: Secondary | ICD-10-CM | POA: Diagnosis not present

## 2018-06-01 DIAGNOSIS — E875 Hyperkalemia: Secondary | ICD-10-CM | POA: Diagnosis not present

## 2018-06-03 DIAGNOSIS — N186 End stage renal disease: Secondary | ICD-10-CM | POA: Diagnosis not present

## 2018-06-03 DIAGNOSIS — E875 Hyperkalemia: Secondary | ICD-10-CM | POA: Diagnosis not present

## 2018-06-03 DIAGNOSIS — N2581 Secondary hyperparathyroidism of renal origin: Secondary | ICD-10-CM | POA: Diagnosis not present

## 2018-06-03 DIAGNOSIS — E1121 Type 2 diabetes mellitus with diabetic nephropathy: Secondary | ICD-10-CM | POA: Diagnosis not present

## 2018-06-05 DIAGNOSIS — N2581 Secondary hyperparathyroidism of renal origin: Secondary | ICD-10-CM | POA: Diagnosis not present

## 2018-06-05 DIAGNOSIS — E1121 Type 2 diabetes mellitus with diabetic nephropathy: Secondary | ICD-10-CM | POA: Diagnosis not present

## 2018-06-05 DIAGNOSIS — N186 End stage renal disease: Secondary | ICD-10-CM | POA: Diagnosis not present

## 2018-06-05 DIAGNOSIS — E875 Hyperkalemia: Secondary | ICD-10-CM | POA: Diagnosis not present

## 2018-06-07 DIAGNOSIS — I10 Essential (primary) hypertension: Secondary | ICD-10-CM | POA: Diagnosis not present

## 2018-06-07 DIAGNOSIS — M5126 Other intervertebral disc displacement, lumbar region: Secondary | ICD-10-CM | POA: Diagnosis not present

## 2018-06-07 DIAGNOSIS — Z6838 Body mass index (BMI) 38.0-38.9, adult: Secondary | ICD-10-CM | POA: Diagnosis not present

## 2018-06-07 DIAGNOSIS — M25561 Pain in right knee: Secondary | ICD-10-CM | POA: Diagnosis not present

## 2018-06-08 DIAGNOSIS — N2581 Secondary hyperparathyroidism of renal origin: Secondary | ICD-10-CM | POA: Diagnosis not present

## 2018-06-08 DIAGNOSIS — N186 End stage renal disease: Secondary | ICD-10-CM | POA: Diagnosis not present

## 2018-06-08 DIAGNOSIS — E1121 Type 2 diabetes mellitus with diabetic nephropathy: Secondary | ICD-10-CM | POA: Diagnosis not present

## 2018-06-08 DIAGNOSIS — E875 Hyperkalemia: Secondary | ICD-10-CM | POA: Diagnosis not present

## 2018-06-10 DIAGNOSIS — E875 Hyperkalemia: Secondary | ICD-10-CM | POA: Diagnosis not present

## 2018-06-10 DIAGNOSIS — E1121 Type 2 diabetes mellitus with diabetic nephropathy: Secondary | ICD-10-CM | POA: Diagnosis not present

## 2018-06-10 DIAGNOSIS — N2581 Secondary hyperparathyroidism of renal origin: Secondary | ICD-10-CM | POA: Diagnosis not present

## 2018-06-10 DIAGNOSIS — N186 End stage renal disease: Secondary | ICD-10-CM | POA: Diagnosis not present

## 2018-06-12 DIAGNOSIS — E875 Hyperkalemia: Secondary | ICD-10-CM | POA: Diagnosis not present

## 2018-06-12 DIAGNOSIS — E1121 Type 2 diabetes mellitus with diabetic nephropathy: Secondary | ICD-10-CM | POA: Diagnosis not present

## 2018-06-12 DIAGNOSIS — N2581 Secondary hyperparathyroidism of renal origin: Secondary | ICD-10-CM | POA: Diagnosis not present

## 2018-06-12 DIAGNOSIS — N186 End stage renal disease: Secondary | ICD-10-CM | POA: Diagnosis not present

## 2018-06-15 DIAGNOSIS — N186 End stage renal disease: Secondary | ICD-10-CM | POA: Diagnosis not present

## 2018-06-15 DIAGNOSIS — E875 Hyperkalemia: Secondary | ICD-10-CM | POA: Diagnosis not present

## 2018-06-15 DIAGNOSIS — N2581 Secondary hyperparathyroidism of renal origin: Secondary | ICD-10-CM | POA: Diagnosis not present

## 2018-06-15 DIAGNOSIS — E1121 Type 2 diabetes mellitus with diabetic nephropathy: Secondary | ICD-10-CM | POA: Diagnosis not present

## 2018-06-17 DIAGNOSIS — N186 End stage renal disease: Secondary | ICD-10-CM | POA: Diagnosis not present

## 2018-06-17 DIAGNOSIS — E875 Hyperkalemia: Secondary | ICD-10-CM | POA: Diagnosis not present

## 2018-06-17 DIAGNOSIS — E1121 Type 2 diabetes mellitus with diabetic nephropathy: Secondary | ICD-10-CM | POA: Diagnosis not present

## 2018-06-17 DIAGNOSIS — N2581 Secondary hyperparathyroidism of renal origin: Secondary | ICD-10-CM | POA: Diagnosis not present

## 2018-06-19 DIAGNOSIS — N186 End stage renal disease: Secondary | ICD-10-CM | POA: Diagnosis not present

## 2018-06-19 DIAGNOSIS — E1121 Type 2 diabetes mellitus with diabetic nephropathy: Secondary | ICD-10-CM | POA: Diagnosis not present

## 2018-06-19 DIAGNOSIS — N2581 Secondary hyperparathyroidism of renal origin: Secondary | ICD-10-CM | POA: Diagnosis not present

## 2018-06-19 DIAGNOSIS — E875 Hyperkalemia: Secondary | ICD-10-CM | POA: Diagnosis not present

## 2018-06-22 DIAGNOSIS — E1121 Type 2 diabetes mellitus with diabetic nephropathy: Secondary | ICD-10-CM | POA: Diagnosis not present

## 2018-06-22 DIAGNOSIS — N2581 Secondary hyperparathyroidism of renal origin: Secondary | ICD-10-CM | POA: Diagnosis not present

## 2018-06-22 DIAGNOSIS — N186 End stage renal disease: Secondary | ICD-10-CM | POA: Diagnosis not present

## 2018-06-22 DIAGNOSIS — E875 Hyperkalemia: Secondary | ICD-10-CM | POA: Diagnosis not present

## 2018-06-24 DIAGNOSIS — N2581 Secondary hyperparathyroidism of renal origin: Secondary | ICD-10-CM | POA: Diagnosis not present

## 2018-06-24 DIAGNOSIS — Z992 Dependence on renal dialysis: Secondary | ICD-10-CM | POA: Diagnosis not present

## 2018-06-24 DIAGNOSIS — N186 End stage renal disease: Secondary | ICD-10-CM | POA: Diagnosis not present

## 2018-06-24 DIAGNOSIS — E1122 Type 2 diabetes mellitus with diabetic chronic kidney disease: Secondary | ICD-10-CM | POA: Diagnosis not present

## 2018-06-24 DIAGNOSIS — E1121 Type 2 diabetes mellitus with diabetic nephropathy: Secondary | ICD-10-CM | POA: Diagnosis not present

## 2018-06-26 DIAGNOSIS — E1121 Type 2 diabetes mellitus with diabetic nephropathy: Secondary | ICD-10-CM | POA: Diagnosis not present

## 2018-06-26 DIAGNOSIS — N186 End stage renal disease: Secondary | ICD-10-CM | POA: Diagnosis not present

## 2018-06-26 DIAGNOSIS — N2581 Secondary hyperparathyroidism of renal origin: Secondary | ICD-10-CM | POA: Diagnosis not present

## 2018-06-29 DIAGNOSIS — E1121 Type 2 diabetes mellitus with diabetic nephropathy: Secondary | ICD-10-CM | POA: Diagnosis not present

## 2018-06-29 DIAGNOSIS — N2581 Secondary hyperparathyroidism of renal origin: Secondary | ICD-10-CM | POA: Diagnosis not present

## 2018-06-29 DIAGNOSIS — N186 End stage renal disease: Secondary | ICD-10-CM | POA: Diagnosis not present

## 2018-06-30 DIAGNOSIS — Z79899 Other long term (current) drug therapy: Secondary | ICD-10-CM | POA: Diagnosis not present

## 2018-06-30 DIAGNOSIS — E785 Hyperlipidemia, unspecified: Secondary | ICD-10-CM | POA: Diagnosis not present

## 2018-06-30 DIAGNOSIS — E1121 Type 2 diabetes mellitus with diabetic nephropathy: Secondary | ICD-10-CM | POA: Diagnosis not present

## 2018-07-01 DIAGNOSIS — N186 End stage renal disease: Secondary | ICD-10-CM | POA: Diagnosis not present

## 2018-07-01 DIAGNOSIS — N2581 Secondary hyperparathyroidism of renal origin: Secondary | ICD-10-CM | POA: Diagnosis not present

## 2018-07-01 DIAGNOSIS — E1121 Type 2 diabetes mellitus with diabetic nephropathy: Secondary | ICD-10-CM | POA: Diagnosis not present

## 2018-07-02 DIAGNOSIS — E11319 Type 2 diabetes mellitus with unspecified diabetic retinopathy without macular edema: Secondary | ICD-10-CM | POA: Diagnosis not present

## 2018-07-02 DIAGNOSIS — E1121 Type 2 diabetes mellitus with diabetic nephropathy: Secondary | ICD-10-CM | POA: Diagnosis not present

## 2018-07-02 DIAGNOSIS — E1142 Type 2 diabetes mellitus with diabetic polyneuropathy: Secondary | ICD-10-CM | POA: Diagnosis not present

## 2018-07-02 DIAGNOSIS — Z0000 Encounter for general adult medical examination without abnormal findings: Secondary | ICD-10-CM | POA: Diagnosis not present

## 2018-07-02 DIAGNOSIS — E785 Hyperlipidemia, unspecified: Secondary | ICD-10-CM | POA: Diagnosis not present

## 2018-07-03 DIAGNOSIS — N2581 Secondary hyperparathyroidism of renal origin: Secondary | ICD-10-CM | POA: Diagnosis not present

## 2018-07-03 DIAGNOSIS — N186 End stage renal disease: Secondary | ICD-10-CM | POA: Diagnosis not present

## 2018-07-03 DIAGNOSIS — E1121 Type 2 diabetes mellitus with diabetic nephropathy: Secondary | ICD-10-CM | POA: Diagnosis not present

## 2018-07-05 DIAGNOSIS — M47816 Spondylosis without myelopathy or radiculopathy, lumbar region: Secondary | ICD-10-CM | POA: Diagnosis not present

## 2018-07-06 DIAGNOSIS — E1121 Type 2 diabetes mellitus with diabetic nephropathy: Secondary | ICD-10-CM | POA: Diagnosis not present

## 2018-07-06 DIAGNOSIS — N2581 Secondary hyperparathyroidism of renal origin: Secondary | ICD-10-CM | POA: Diagnosis not present

## 2018-07-06 DIAGNOSIS — N186 End stage renal disease: Secondary | ICD-10-CM | POA: Diagnosis not present

## 2018-07-07 DIAGNOSIS — E113591 Type 2 diabetes mellitus with proliferative diabetic retinopathy without macular edema, right eye: Secondary | ICD-10-CM | POA: Diagnosis not present

## 2018-07-08 DIAGNOSIS — N186 End stage renal disease: Secondary | ICD-10-CM | POA: Diagnosis not present

## 2018-07-08 DIAGNOSIS — E1121 Type 2 diabetes mellitus with diabetic nephropathy: Secondary | ICD-10-CM | POA: Diagnosis not present

## 2018-07-08 DIAGNOSIS — N2581 Secondary hyperparathyroidism of renal origin: Secondary | ICD-10-CM | POA: Diagnosis not present

## 2018-07-10 DIAGNOSIS — E1121 Type 2 diabetes mellitus with diabetic nephropathy: Secondary | ICD-10-CM | POA: Diagnosis not present

## 2018-07-10 DIAGNOSIS — N186 End stage renal disease: Secondary | ICD-10-CM | POA: Diagnosis not present

## 2018-07-10 DIAGNOSIS — N2581 Secondary hyperparathyroidism of renal origin: Secondary | ICD-10-CM | POA: Diagnosis not present

## 2018-07-13 DIAGNOSIS — E1121 Type 2 diabetes mellitus with diabetic nephropathy: Secondary | ICD-10-CM | POA: Diagnosis not present

## 2018-07-13 DIAGNOSIS — N186 End stage renal disease: Secondary | ICD-10-CM | POA: Diagnosis not present

## 2018-07-13 DIAGNOSIS — N2581 Secondary hyperparathyroidism of renal origin: Secondary | ICD-10-CM | POA: Diagnosis not present

## 2018-07-15 DIAGNOSIS — N2581 Secondary hyperparathyroidism of renal origin: Secondary | ICD-10-CM | POA: Diagnosis not present

## 2018-07-15 DIAGNOSIS — E1121 Type 2 diabetes mellitus with diabetic nephropathy: Secondary | ICD-10-CM | POA: Diagnosis not present

## 2018-07-15 DIAGNOSIS — N186 End stage renal disease: Secondary | ICD-10-CM | POA: Diagnosis not present

## 2018-07-17 DIAGNOSIS — N2581 Secondary hyperparathyroidism of renal origin: Secondary | ICD-10-CM | POA: Diagnosis not present

## 2018-07-17 DIAGNOSIS — E1121 Type 2 diabetes mellitus with diabetic nephropathy: Secondary | ICD-10-CM | POA: Diagnosis not present

## 2018-07-17 DIAGNOSIS — N186 End stage renal disease: Secondary | ICD-10-CM | POA: Diagnosis not present

## 2018-07-20 DIAGNOSIS — N2581 Secondary hyperparathyroidism of renal origin: Secondary | ICD-10-CM | POA: Diagnosis not present

## 2018-07-20 DIAGNOSIS — N186 End stage renal disease: Secondary | ICD-10-CM | POA: Diagnosis not present

## 2018-07-20 DIAGNOSIS — E1121 Type 2 diabetes mellitus with diabetic nephropathy: Secondary | ICD-10-CM | POA: Diagnosis not present

## 2018-07-22 DIAGNOSIS — N186 End stage renal disease: Secondary | ICD-10-CM | POA: Diagnosis not present

## 2018-07-22 DIAGNOSIS — E1121 Type 2 diabetes mellitus with diabetic nephropathy: Secondary | ICD-10-CM | POA: Diagnosis not present

## 2018-07-22 DIAGNOSIS — N2581 Secondary hyperparathyroidism of renal origin: Secondary | ICD-10-CM | POA: Diagnosis not present

## 2018-07-24 DIAGNOSIS — N186 End stage renal disease: Secondary | ICD-10-CM | POA: Diagnosis not present

## 2018-07-24 DIAGNOSIS — N2581 Secondary hyperparathyroidism of renal origin: Secondary | ICD-10-CM | POA: Diagnosis not present

## 2018-07-24 DIAGNOSIS — E1121 Type 2 diabetes mellitus with diabetic nephropathy: Secondary | ICD-10-CM | POA: Diagnosis not present

## 2018-07-25 DIAGNOSIS — Z992 Dependence on renal dialysis: Secondary | ICD-10-CM | POA: Diagnosis not present

## 2018-07-25 DIAGNOSIS — N186 End stage renal disease: Secondary | ICD-10-CM | POA: Diagnosis not present

## 2018-07-25 DIAGNOSIS — E1122 Type 2 diabetes mellitus with diabetic chronic kidney disease: Secondary | ICD-10-CM | POA: Diagnosis not present

## 2018-07-27 DIAGNOSIS — D631 Anemia in chronic kidney disease: Secondary | ICD-10-CM | POA: Diagnosis not present

## 2018-07-27 DIAGNOSIS — N2581 Secondary hyperparathyroidism of renal origin: Secondary | ICD-10-CM | POA: Diagnosis not present

## 2018-07-27 DIAGNOSIS — S41111A Laceration without foreign body of right upper arm, initial encounter: Secondary | ICD-10-CM | POA: Diagnosis not present

## 2018-07-27 DIAGNOSIS — E1122 Type 2 diabetes mellitus with diabetic chronic kidney disease: Secondary | ICD-10-CM | POA: Diagnosis not present

## 2018-07-27 DIAGNOSIS — I120 Hypertensive chronic kidney disease with stage 5 chronic kidney disease or end stage renal disease: Secondary | ICD-10-CM | POA: Diagnosis not present

## 2018-07-27 DIAGNOSIS — R58 Hemorrhage, not elsewhere classified: Secondary | ICD-10-CM | POA: Diagnosis not present

## 2018-07-27 DIAGNOSIS — Z992 Dependence on renal dialysis: Secondary | ICD-10-CM | POA: Diagnosis not present

## 2018-07-27 DIAGNOSIS — S41101A Unspecified open wound of right upper arm, initial encounter: Secondary | ICD-10-CM | POA: Diagnosis not present

## 2018-07-27 DIAGNOSIS — S41131A Puncture wound without foreign body of right upper arm, initial encounter: Secondary | ICD-10-CM | POA: Diagnosis not present

## 2018-07-27 DIAGNOSIS — T82898A Other specified complication of vascular prosthetic devices, implants and grafts, initial encounter: Secondary | ICD-10-CM | POA: Diagnosis not present

## 2018-07-27 DIAGNOSIS — N186 End stage renal disease: Secondary | ICD-10-CM | POA: Diagnosis not present

## 2018-07-29 DIAGNOSIS — N2581 Secondary hyperparathyroidism of renal origin: Secondary | ICD-10-CM | POA: Diagnosis not present

## 2018-07-29 DIAGNOSIS — N186 End stage renal disease: Secondary | ICD-10-CM | POA: Diagnosis not present

## 2018-07-29 DIAGNOSIS — D631 Anemia in chronic kidney disease: Secondary | ICD-10-CM | POA: Diagnosis not present

## 2018-07-31 DIAGNOSIS — D631 Anemia in chronic kidney disease: Secondary | ICD-10-CM | POA: Diagnosis not present

## 2018-07-31 DIAGNOSIS — N186 End stage renal disease: Secondary | ICD-10-CM | POA: Diagnosis not present

## 2018-07-31 DIAGNOSIS — N2581 Secondary hyperparathyroidism of renal origin: Secondary | ICD-10-CM | POA: Diagnosis not present

## 2018-08-03 DIAGNOSIS — N2581 Secondary hyperparathyroidism of renal origin: Secondary | ICD-10-CM | POA: Diagnosis not present

## 2018-08-03 DIAGNOSIS — D631 Anemia in chronic kidney disease: Secondary | ICD-10-CM | POA: Diagnosis not present

## 2018-08-03 DIAGNOSIS — N186 End stage renal disease: Secondary | ICD-10-CM | POA: Diagnosis not present

## 2018-08-05 DIAGNOSIS — N2581 Secondary hyperparathyroidism of renal origin: Secondary | ICD-10-CM | POA: Diagnosis not present

## 2018-08-05 DIAGNOSIS — D631 Anemia in chronic kidney disease: Secondary | ICD-10-CM | POA: Diagnosis not present

## 2018-08-05 DIAGNOSIS — N186 End stage renal disease: Secondary | ICD-10-CM | POA: Diagnosis not present

## 2018-08-07 DIAGNOSIS — N186 End stage renal disease: Secondary | ICD-10-CM | POA: Diagnosis not present

## 2018-08-07 DIAGNOSIS — N2581 Secondary hyperparathyroidism of renal origin: Secondary | ICD-10-CM | POA: Diagnosis not present

## 2018-08-07 DIAGNOSIS — D631 Anemia in chronic kidney disease: Secondary | ICD-10-CM | POA: Diagnosis not present

## 2018-08-09 DIAGNOSIS — M47816 Spondylosis without myelopathy or radiculopathy, lumbar region: Secondary | ICD-10-CM | POA: Diagnosis not present

## 2018-08-10 DIAGNOSIS — D631 Anemia in chronic kidney disease: Secondary | ICD-10-CM | POA: Diagnosis not present

## 2018-08-10 DIAGNOSIS — N2581 Secondary hyperparathyroidism of renal origin: Secondary | ICD-10-CM | POA: Diagnosis not present

## 2018-08-10 DIAGNOSIS — N186 End stage renal disease: Secondary | ICD-10-CM | POA: Diagnosis not present

## 2018-08-11 DIAGNOSIS — E8770 Fluid overload, unspecified: Secondary | ICD-10-CM | POA: Diagnosis not present

## 2018-08-11 DIAGNOSIS — N2581 Secondary hyperparathyroidism of renal origin: Secondary | ICD-10-CM | POA: Diagnosis not present

## 2018-08-11 DIAGNOSIS — N186 End stage renal disease: Secondary | ICD-10-CM | POA: Diagnosis not present

## 2018-08-12 DIAGNOSIS — N186 End stage renal disease: Secondary | ICD-10-CM | POA: Diagnosis not present

## 2018-08-12 DIAGNOSIS — D631 Anemia in chronic kidney disease: Secondary | ICD-10-CM | POA: Diagnosis not present

## 2018-08-12 DIAGNOSIS — N2581 Secondary hyperparathyroidism of renal origin: Secondary | ICD-10-CM | POA: Diagnosis not present

## 2018-08-14 DIAGNOSIS — N2581 Secondary hyperparathyroidism of renal origin: Secondary | ICD-10-CM | POA: Diagnosis not present

## 2018-08-14 DIAGNOSIS — N186 End stage renal disease: Secondary | ICD-10-CM | POA: Diagnosis not present

## 2018-08-14 DIAGNOSIS — D631 Anemia in chronic kidney disease: Secondary | ICD-10-CM | POA: Diagnosis not present

## 2018-08-17 ENCOUNTER — Encounter (HOSPITAL_COMMUNITY): Payer: Self-pay

## 2018-08-17 ENCOUNTER — Inpatient Hospital Stay (HOSPITAL_COMMUNITY)
Admission: AD | Admit: 2018-08-17 | Discharge: 2018-08-24 | DRG: 871 | Disposition: A | Discharge status: Home / Self Care | Payer: Medicare Other | Source: Other Acute Inpatient Hospital | Attending: Internal Medicine | Admitting: Internal Medicine

## 2018-08-17 ENCOUNTER — Other Ambulatory Visit: Payer: Self-pay

## 2018-08-17 DIAGNOSIS — Z79891 Long term (current) use of opiate analgesic: Secondary | ICD-10-CM

## 2018-08-17 DIAGNOSIS — K819 Cholecystitis, unspecified: Secondary | ICD-10-CM

## 2018-08-17 DIAGNOSIS — R945 Abnormal results of liver function studies: Secondary | ICD-10-CM

## 2018-08-17 DIAGNOSIS — Z794 Long term (current) use of insulin: Secondary | ICD-10-CM

## 2018-08-17 DIAGNOSIS — I120 Hypertensive chronic kidney disease with stage 5 chronic kidney disease or end stage renal disease: Secondary | ICD-10-CM | POA: Diagnosis present

## 2018-08-17 DIAGNOSIS — G92 Toxic encephalopathy: Secondary | ICD-10-CM | POA: Diagnosis present

## 2018-08-17 DIAGNOSIS — D631 Anemia in chronic kidney disease: Secondary | ICD-10-CM | POA: Diagnosis present

## 2018-08-17 DIAGNOSIS — Z7982 Long term (current) use of aspirin: Secondary | ICD-10-CM | POA: Diagnosis not present

## 2018-08-17 DIAGNOSIS — T82898A Other specified complication of vascular prosthetic devices, implants and grafts, initial encounter: Secondary | ICD-10-CM | POA: Diagnosis not present

## 2018-08-17 DIAGNOSIS — I2510 Atherosclerotic heart disease of native coronary artery without angina pectoris: Secondary | ICD-10-CM | POA: Diagnosis present

## 2018-08-17 DIAGNOSIS — Z992 Dependence on renal dialysis: Secondary | ICD-10-CM

## 2018-08-17 DIAGNOSIS — Z888 Allergy status to other drugs, medicaments and biological substances status: Secondary | ICD-10-CM

## 2018-08-17 DIAGNOSIS — R1013 Epigastric pain: Secondary | ICD-10-CM | POA: Diagnosis not present

## 2018-08-17 DIAGNOSIS — N2581 Secondary hyperparathyroidism of renal origin: Secondary | ICD-10-CM | POA: Diagnosis present

## 2018-08-17 DIAGNOSIS — K810 Acute cholecystitis: Secondary | ICD-10-CM

## 2018-08-17 DIAGNOSIS — T82590A Other mechanical complication of surgically created arteriovenous fistula, initial encounter: Secondary | ICD-10-CM

## 2018-08-17 DIAGNOSIS — Z23 Encounter for immunization: Secondary | ICD-10-CM | POA: Diagnosis not present

## 2018-08-17 DIAGNOSIS — E875 Hyperkalemia: Secondary | ICD-10-CM | POA: Diagnosis present

## 2018-08-17 DIAGNOSIS — N186 End stage renal disease: Secondary | ICD-10-CM | POA: Diagnosis present

## 2018-08-17 DIAGNOSIS — Z79899 Other long term (current) drug therapy: Secondary | ICD-10-CM

## 2018-08-17 DIAGNOSIS — R932 Abnormal findings on diagnostic imaging of liver and biliary tract: Secondary | ICD-10-CM | POA: Diagnosis not present

## 2018-08-17 DIAGNOSIS — E1122 Type 2 diabetes mellitus with diabetic chronic kidney disease: Secondary | ICD-10-CM | POA: Diagnosis present

## 2018-08-17 DIAGNOSIS — Z79890 Hormone replacement therapy (postmenopausal): Secondary | ICD-10-CM | POA: Diagnosis not present

## 2018-08-17 DIAGNOSIS — Z882 Allergy status to sulfonamides status: Secondary | ICD-10-CM | POA: Diagnosis not present

## 2018-08-17 DIAGNOSIS — E119 Type 2 diabetes mellitus without complications: Secondary | ICD-10-CM

## 2018-08-17 DIAGNOSIS — R0600 Dyspnea, unspecified: Secondary | ICD-10-CM | POA: Diagnosis not present

## 2018-08-17 DIAGNOSIS — I440 Atrioventricular block, first degree: Secondary | ICD-10-CM | POA: Diagnosis present

## 2018-08-17 DIAGNOSIS — E66813 Obesity, class 3: Secondary | ICD-10-CM

## 2018-08-17 DIAGNOSIS — G4733 Obstructive sleep apnea (adult) (pediatric): Secondary | ICD-10-CM | POA: Diagnosis present

## 2018-08-17 DIAGNOSIS — R7989 Other specified abnormal findings of blood chemistry: Secondary | ICD-10-CM

## 2018-08-17 DIAGNOSIS — K8020 Calculus of gallbladder without cholecystitis without obstruction: Secondary | ICD-10-CM | POA: Diagnosis not present

## 2018-08-17 DIAGNOSIS — A419 Sepsis, unspecified organism: Principal | ICD-10-CM | POA: Diagnosis present

## 2018-08-17 DIAGNOSIS — J449 Chronic obstructive pulmonary disease, unspecified: Secondary | ICD-10-CM | POA: Diagnosis present

## 2018-08-17 DIAGNOSIS — R5381 Other malaise: Secondary | ICD-10-CM | POA: Diagnosis not present

## 2018-08-17 DIAGNOSIS — K8000 Calculus of gallbladder with acute cholecystitis without obstruction: Secondary | ICD-10-CM | POA: Diagnosis present

## 2018-08-17 DIAGNOSIS — I700 Atherosclerosis of aorta: Secondary | ICD-10-CM | POA: Diagnosis not present

## 2018-08-17 DIAGNOSIS — I10 Essential (primary) hypertension: Secondary | ICD-10-CM | POA: Diagnosis not present

## 2018-08-17 DIAGNOSIS — Z6841 Body Mass Index (BMI) 40.0 and over, adult: Secondary | ICD-10-CM

## 2018-08-17 DIAGNOSIS — E1129 Type 2 diabetes mellitus with other diabetic kidney complication: Secondary | ICD-10-CM

## 2018-08-17 DIAGNOSIS — E1165 Type 2 diabetes mellitus with hyperglycemia: Secondary | ICD-10-CM | POA: Diagnosis not present

## 2018-08-17 DIAGNOSIS — K8010 Calculus of gallbladder with chronic cholecystitis without obstruction: Secondary | ICD-10-CM | POA: Diagnosis not present

## 2018-08-17 HISTORY — DX: Atherosclerotic heart disease of native coronary artery without angina pectoris: I25.10

## 2018-08-17 HISTORY — DX: Acute cholecystitis: K81.0

## 2018-08-17 HISTORY — DX: End stage renal disease: N18.6

## 2018-08-17 HISTORY — DX: Obstructive sleep apnea (adult) (pediatric): G47.33

## 2018-08-17 HISTORY — DX: End stage renal disease: Z99.2

## 2018-08-17 HISTORY — DX: Chronic obstructive pulmonary disease, unspecified: J44.9

## 2018-08-17 LAB — GLUCOSE, CAPILLARY: Glucose-Capillary: 419 mg/dL — ABNORMAL HIGH (ref 70–99)

## 2018-08-17 LAB — HEMOGLOBIN A1C
Hgb A1c MFr Bld: 9.7 % — ABNORMAL HIGH (ref 4.8–5.6)
Mean Plasma Glucose: 231.69 mg/dL

## 2018-08-17 MED ORDER — HYDROCODONE-ACETAMINOPHEN 5-325 MG PO TABS: 1.0000 | ORAL_TABLET | ORAL | Status: DC | PRN

## 2018-08-17 MED ORDER — PIPERACILLIN-TAZOBACTAM 4.5 G IVPB: 4.5000 g | Freq: Two times a day (BID) | INTRAVENOUS | Status: DC

## 2018-08-17 MED ORDER — SENNOSIDES-DOCUSATE SODIUM 8.6-50 MG PO TABS: 1.0000 | ORAL_TABLET | Freq: Every evening | ORAL | Status: DC | PRN

## 2018-08-17 MED ORDER — ONDANSETRON HCL 4 MG PO TABS
4.0000 mg | ORAL_TABLET | Freq: Four times a day (QID) | ORAL | Status: DC | PRN
  Administered 2018-08-20: 4 mg via ORAL
  Filled 2018-08-17 (×2): qty 1

## 2018-08-17 MED ORDER — ONDANSETRON HCL 4 MG/2ML IJ SOLN
4.0000 mg | Freq: Four times a day (QID) | INTRAMUSCULAR | Status: DC | PRN
  Administered 2018-08-19 – 2018-08-23 (×8): 4 mg via INTRAVENOUS
  Filled 2018-08-17 (×8): qty 2

## 2018-08-17 MED ORDER — INSULIN ASPART 100 UNIT/ML ~~LOC~~ SOLN
0.0000 [IU] | Freq: Three times a day (TID) | SUBCUTANEOUS | Status: DC
  Administered 2018-08-18: 9 [IU] via SUBCUTANEOUS
  Administered 2018-08-18 – 2018-08-19 (×2): 2 [IU] via SUBCUTANEOUS
  Administered 2018-08-20: 3 [IU] via SUBCUTANEOUS
  Administered 2018-08-20 (×2): 5 [IU] via SUBCUTANEOUS
  Administered 2018-08-21: 2 [IU] via SUBCUTANEOUS
  Administered 2018-08-21: 1 [IU] via SUBCUTANEOUS
  Administered 2018-08-22: 5 [IU] via SUBCUTANEOUS
  Administered 2018-08-22: 2 [IU] via SUBCUTANEOUS
  Administered 2018-08-22: 1 [IU] via SUBCUTANEOUS
  Administered 2018-08-23: 2 [IU] via SUBCUTANEOUS
  Administered 2018-08-23: 3 [IU] via SUBCUTANEOUS
  Administered 2018-08-23 – 2018-08-24 (×3): 2 [IU] via SUBCUTANEOUS

## 2018-08-17 MED ORDER — ACETAMINOPHEN 325 MG PO TABS
650.0000 mg | ORAL_TABLET | Freq: Four times a day (QID) | ORAL | Status: DC | PRN
  Administered 2018-08-20: 650 mg via ORAL
  Filled 2018-08-17: qty 2

## 2018-08-17 MED ORDER — ACETAMINOPHEN 650 MG RE SUPP: 650.0000 mg | Freq: Four times a day (QID) | RECTAL | Status: DC | PRN

## 2018-08-17 MED ORDER — INSULIN ASPART 100 UNIT/ML ~~LOC~~ SOLN
8.0000 [IU] | Freq: Once | SUBCUTANEOUS | Status: AC
  Administered 2018-08-17: 8 [IU] via SUBCUTANEOUS

## 2018-08-17 MED ORDER — INFLUENZA VAC SPLIT HIGH-DOSE 0.5 ML IM SUSY
0.5000 mL | PREFILLED_SYRINGE | INTRAMUSCULAR | Status: AC
  Administered 2018-08-20: 0.5 mL via INTRAMUSCULAR
  Filled 2018-08-17 (×2): qty 0.5

## 2018-08-17 MED ORDER — PIPERACILLIN-TAZOBACTAM 3.375 G IVPB
3.3750 g | Freq: Two times a day (BID) | INTRAVENOUS | Status: DC
  Administered 2018-08-17 – 2018-08-22 (×10): 3.375 g via INTRAVENOUS
  Filled 2018-08-17 (×13): qty 50

## 2018-08-17 NOTE — Progress Notes (Signed)
PHARMACY NOTE:  ANTIMICROBIAL RENAL DOSAGE ADJUSTMENT  Current antimicrobial regimen includes a mismatch between antimicrobial dosage and estimated renal function.  As per policy approved by the Pharmacy & Therapeutics and Medical Executive Committees, the antimicrobial dosage will be adjusted accordingly.  Current antimicrobial dosage:  Zosyn 4.5gm IV q12h  Indication: intra-abdominal infection, acute cholecystitis  Renal Function:  CrCl cannot be calculated (Patient's most recent lab result is older than the maximum 21 days allowed.). [x]      On intermittent HD, scheduled: TTS []      On CRRT    Antimicrobial dosage has been changed to:  Zosyn 3.375 gm IV q12h (4 hour infusion).    Thank you for allowing pharmacy to be a part of this patient's care.  Elmer Ramp, [facility] 08/17/2018 9:35 PM

## 2018-08-17 NOTE — Progress Notes (Signed)
Notified MD that pt's family has questions and is at the bedside,

## 2018-08-17 NOTE — Progress Notes (Signed)
Text paged MD that pt has arrived to floor.

## 2018-08-17 NOTE — H&P (Addendum)
History and Physical    Christian Dalton RXV:[id number] DOB: 20-Mar-1941 DOA: 08/17/2018  PCP: Venetia Maxon, Sharon Mt, MD   Patient coming from: Christian Dalton  I have personally briefly reviewed patient's old medical records in [place]  Chief Complaint: epigastric pain   HPI: Christian Dalton is a 77 y.o. male with medical history significant of end-stage renal disease on hemodialysis, diabetes, hypertension, coronary artery disease presents with epigastric pain.  Of note patient is quite lethargic after getting some IV morphine at the outlying facility so he cannot give me any history and he is unaccompanied.  However per checkout and per documentation patient came in complaining of epigastric discomfort.  Did not have any fever or nausea, vomiting.  Patient had a CT scan of his chest and abdomen as well as an ultrasound of his abdomen that showed changes suggestive of acute cholecystitis.  CT of his chest was negative for any acute infiltrate or PE.  Patient did not have a white count was only 10,000.  ED Course: Patient was accepted for transfer here and Dr. Grandville Silos general surgery was consulted.  Received IV hydralazine ,8 mg of IV morphine total, and ertapenem at the outlying facility.  Review of Systems: History unobtainable due to lethargy  and patient is unaccompanied.   Past Medical History:  Diagnosis Date  . CAD (coronary artery disease)   . COPD (chronic obstructive pulmonary disease) ([place])   . Diabetes mellitus without complication ([place])   . ESRD (end stage renal disease) on dialysis ([place])   . Hypertension   . OSA (obstructive sleep apnea)   . Renal disorder   . Spinal stenosis     Past Surgical History:  Procedure Laterality Date  . HAND SURGERY Right   . SHOULDER SURGERY Right   . TIBIA FRACTURE SURGERY       reports that he has never smoked. He has quit using smokeless tobacco. He reports that he drinks alcohol. He reports that he does not use drugs.  Allergies    Allergen Reactions  . Avelox [Moxifloxacin Hcl]   . Sulfa Antibiotics   . Tetracyclines & Related      family history. History unobtainable due to lethargy and patient is unaccompanied.    Prior to Admission medications   Medication Sig Start Date End Date Taking? Authorizing Provider  aspirin EC 81 MG tablet Take 81 mg every evening by mouth.    [provider]  atorvastatin (LIPITOR) 20 MG tablet Take 20 mg every evening by mouth. 01/29/15   [provider]  buPROPion (WELLBUTRIN XL) 150 MG 24 hr tablet Take 150 mg daily by mouth.    [provider]  docusate sodium (COLACE) 100 MG capsule Take 100 mg 3 (three) times daily by mouth.    [provider]  escitalopram (LEXAPRO) 10 MG tablet Take 10 mg every evening by mouth. 08/30/15   [provider]  HYDROcodone-acetaminophen (NORCO) 10-325 MG tablet Take 1 tablet every 8 (eight) hours as needed by mouth for pain. 01/16/17   [provider]  insulin detemir (LEVEMIR) 100 UNIT/ML injection Inject 50 Units 2 (two) times daily into the skin.    [provider]  insulin lispro (HUMALOG) 100 UNIT/ML injection Inject 10 Units 2 (two) times daily into the skin.    [provider]  levothyroxine (SYNTHROID, LEVOTHROID) 125 MCG tablet Take 125 mcg daily by mouth.    [provider]  linaclotide Rolan Lipa) 145 MCG CAPS capsule Take 145 mcg daily  after supper by mouth.    [provider]  loratadine (CLARITIN) 10 MG tablet Take 10 mg every evening by mouth.    [provider]  montelukast (SINGULAIR) 10 MG tablet Take 10 mg every evening by mouth.    [provider]  oxyCODONE-acetaminophen (PERCOCET) 5-325 MG tablet Take 1-2 tablets by mouth every 6 (six) hours as needed. Patient not taking: Reported on 10/05/2017 05/16/16   Veryl Speak, MD  polyethylene glycol (MIRALAX / GLYCOLAX) packet Take 17 g daily after supper by mouth.    [provider]  pregabalin (LYRICA) 100 MG capsule Take 100 mg 2 (two) times daily by mouth. 07/23/15   [provider]  sevelamer carbonate (RENVELA) 800 MG tablet Take 1,600 mg 3 (three) times daily by mouth.    [provider]    Physical Exam: Vitals:   08/17/18 1800  BP: (!) 155/76  Pulse: (!) 105  Resp: 18  Temp: 98.5 F (36.9 C)  TempSrc: Oral    Constitutional: NAD, calm, comfortable, lethargic Vitals:   08/17/18 1800  BP: (!) 155/76  Pulse: (!) 105  Resp: 18  Temp: 98.5 F (36.9 C)  TempSrc: Oral   Eyes: PERRL, lids and conjunctivae normal ENMT: Mucous membranes are moist.   Neck: normal, supple, no masses,  Respiratory: clear to auscultation bilaterally, no wheezing, no crackles. Normal respiratory effort. No accessory muscle use.  Cardiovascular: Regular rate and rhythm, no murmurs / rubs / gallops. No extremity edema. 1+ pedal pulses.  Abdomen: morbid  Obese ,positive epigastric tenderness  no masses palpated. No hepatosplenomegaly. Bowel sounds positive.  Musculoskeletal: no clubbing / cyanosis. No joint deformity upper and lower extremities.  no contractures. Normal muscle tone.  Left arm fistula Skin: no rashes, lesions, ulcers. No induration Neurologic: Appears to move all extremities randomly equally lethargic not following commands or answering questions appropriately Psychiatric: Lethargic unable to answer any questions appropriately    Labs on Admission: I have personally reviewed following labs and imaging studies From outside facility:  white count 10,000 hemoglobin 15 platelets 148,000 sodium 133 potassium 5.1 BUN 77 creatinine 9.1 glucose 387  CBC: No results for input(s): WBC, NEUTROABS, HGB, HCT, MCV, PLT in the last 168 hours. Basic Metabolic Panel: No results for input(s): NA, K, CL, CO2, GLUCOSE, BUN, CREATININE, CALCIUM, MG, PHOS in the last 168 hours. GFR: CrCl cannot be calculated (Patient's most recent lab result is older  than the maximum 21 days allowed.). Liver Function Tests: No results for input(s): AST, ALT, ALKPHOS, BILITOT, PROT, ALBUMIN in the last 168 hours. No results for input(s): LIPASE, AMYLASE in the last 168 hours. No results for input(s): AMMONIA in the last 168 hours. Coagulation Profile: No results for input(s): INR, PROTIME in the last 168 hours. Cardiac Enzymes: No results for input(s): CKTOTAL, CKMB, CKMBINDEX, TROPONINI in the last 168 hours. BNP (last 3 results) No results for input(s): PROBNP in the last 8760 hours. HbA1C: Recent Labs    08/17/18 1918  HGBA1C 9.7*   CBG: No results for input(s): GLUCAP in the last 168 hours. Lipid Profile: No results for input(s): CHOL, HDL, LDLCALC, TRIG, CHOLHDL, LDLDIRECT in the last 72 hours. Thyroid Function Tests: No results for input(s): TSH, T4TOTAL, FREET4, T3FREE, THYROIDAB in the last 72 hours. Anemia Panel: No results for input(s): VITAMINB12, FOLATE, FERRITIN, TIBC, IRON, RETICCTPCT in the last 72 hours. Urine analysis: No results found for: COLORURINE, APPEARANCEUR, [place], [place], [place], [place], [place], [place], [place], [place], NITRITE, LEUKOCYTESUR  Radiological Exams on Admission: No results found.    Assessment/Plan Principal Problem:   Acute cholecystitis Active Problems:   ESRD (end stage renal disease) on dialysis (HCC)   DM2 (diabetes mellitus, type 2) (HCC)   HTN (hypertension)   CAD (coronary artery disease)   Obesity, Class III, BMI 40-49.9 (morbid obesity) ([place])   -IV Zosyn, supportive care, general surgery consulted -Nephrology consulted patient is due for dialysis tomorrow -Carb consistent diet, sliding scale insulin ,  -Continue home medication for hypertension and coronary artery disease awaiting home med reconciliation  DVT prophylaxis: SCDs  Code Status: Full  Disposition Plan: 2 to 3 days home  Consults called: Dr. Joelyn Oms nephrology, Dr. Grandville Silos general surgery Admission  status: Inpatient medical It is my clinical opinion that admission to INPATIENT is reasonable and necessary because of the expectation that this patient will require hospital care that crosses at least 2 midnights to treat this condition based on the medical complexity of the problems presented.    Shelbie Proctor MD [facility] Pager [phone]  If 7PM-7AM, please contact night-coverage [email or web address] Password [facility]  08/17/2018, 9:55 PM

## 2018-08-17 NOTE — Progress Notes (Signed)
Paged General Surgery to informed that patient was on the unit. No new orders MD aware that patient is on the unit. Arthor Captain LPN

## 2018-08-18 ENCOUNTER — Inpatient Hospital Stay (HOSPITAL_COMMUNITY): Payer: Medicare Other

## 2018-08-18 ENCOUNTER — Encounter (HOSPITAL_COMMUNITY): Payer: Self-pay | Admitting: General Surgery

## 2018-08-18 LAB — HEPATIC FUNCTION PANEL
ALT: 416 U/L — ABNORMAL HIGH (ref 0–44)
AST: 206 U/L — ABNORMAL HIGH (ref 15–41)
Albumin: 2.9 g/dL — ABNORMAL LOW (ref 3.5–5.0)
Alkaline Phosphatase: 332 U/L — ABNORMAL HIGH (ref 38–126)
Bilirubin, Direct: 2.8 mg/dL — ABNORMAL HIGH (ref 0.0–0.2)
Indirect Bilirubin: 2.2 mg/dL — ABNORMAL HIGH (ref 0.3–0.9)
Total Bilirubin: 5 mg/dL — ABNORMAL HIGH (ref 0.3–1.2)
Total Protein: 6.3 g/dL — ABNORMAL LOW (ref 6.5–8.1)

## 2018-08-18 LAB — CBC
HCT: 49 % (ref 39.0–52.0)
Hemoglobin: 15.9 g/dL (ref 13.0–17.0)
MCH: 31.2 pg (ref 26.0–34.0)
MCHC: 32.4 g/dL (ref 30.0–36.0)
MCV: 96.3 fL (ref 78.0–100.0)
Platelets: 156 10*3/uL (ref 150–400)
RBC: 5.09 MIL/uL (ref 4.22–5.81)
RDW: 15.9 % — ABNORMAL HIGH (ref 11.5–15.5)
WBC: 18.4 10*3/uL — ABNORMAL HIGH (ref 4.0–10.5)

## 2018-08-18 LAB — BASIC METABOLIC PANEL
Anion gap: 21 — ABNORMAL HIGH (ref 5–15)
BUN: 59 mg/dL — ABNORMAL HIGH (ref 8–23)
CO2: 24 mmol/L (ref 22–32)
Calcium: 9 mg/dL (ref 8.9–10.3)
Chloride: 95 mmol/L — ABNORMAL LOW (ref 98–111)
Creatinine, Ser: 8.36 mg/dL — ABNORMAL HIGH (ref 0.61–1.24)
GFR calc Af Amer: 6 mL/min — ABNORMAL LOW (ref >60)
GFR calc non Af Amer: 5 mL/min — ABNORMAL LOW (ref >60)
Glucose, Bld: 186 mg/dL — ABNORMAL HIGH (ref 70–99)
Potassium: 5.9 mmol/L — ABNORMAL HIGH (ref 3.5–5.1)
Sodium: 140 mmol/L (ref 135–145)

## 2018-08-18 LAB — GLUCOSE, CAPILLARY
Glucose-Capillary: 151 mg/dL — ABNORMAL HIGH (ref 70–99)
Glucose-Capillary: 197 mg/dL — ABNORMAL HIGH (ref 70–99)
Glucose-Capillary: 373 mg/dL — ABNORMAL HIGH (ref 70–99)
Glucose-Capillary: 374 mg/dL — ABNORMAL HIGH (ref 70–99)
Glucose-Capillary: 396 mg/dL — ABNORMAL HIGH (ref 70–99)

## 2018-08-18 LAB — COMPREHENSIVE METABOLIC PANEL
ALT: 499 U/L — ABNORMAL HIGH (ref 0–44)
AST: 303 U/L — ABNORMAL HIGH (ref 15–41)
Albumin: 3 g/dL — ABNORMAL LOW (ref 3.5–5.0)
Alkaline Phosphatase: 333 U/L — ABNORMAL HIGH (ref 38–126)
Anion gap: 21 — ABNORMAL HIGH (ref 5–15)
BUN: 103 mg/dL — ABNORMAL HIGH (ref 8–23)
CO2: 22 mmol/L (ref 22–32)
Calcium: 8.8 mg/dL — ABNORMAL LOW (ref 8.9–10.3)
Chloride: 94 mmol/L — ABNORMAL LOW (ref 98–111)
Creatinine, Ser: 11.8 mg/dL — ABNORMAL HIGH (ref 0.61–1.24)
GFR calc Af Amer: 4 mL/min — ABNORMAL LOW (ref >60)
GFR calc non Af Amer: 4 mL/min — ABNORMAL LOW (ref >60)
Glucose, Bld: 382 mg/dL — ABNORMAL HIGH (ref 70–99)
Potassium: 7.3 mmol/L (ref 3.5–5.1)
Sodium: 137 mmol/L (ref 135–145)
Total Bilirubin: 4.4 mg/dL — ABNORMAL HIGH (ref 0.3–1.2)
Total Protein: 6.4 g/dL — ABNORMAL LOW (ref 6.5–8.1)

## 2018-08-18 LAB — SURGICAL PCR SCREEN
MRSA, PCR: NEGATIVE
Staphylococcus aureus: NEGATIVE

## 2018-08-18 MED ORDER — LIDOCAINE HCL (PF) 1 % IJ SOLN: 5.0000 mL | INTRAMUSCULAR | Status: DC | PRN

## 2018-08-18 MED ORDER — CHLORHEXIDINE GLUCONATE CLOTH 2 % EX PADS: 6.0000 | MEDICATED_PAD | Freq: Every day | CUTANEOUS | Status: DC

## 2018-08-18 MED ORDER — SODIUM CHLORIDE 0.9 % IV SOLN: 100.0000 mL | INTRAVENOUS | Status: DC | PRN

## 2018-08-18 MED ORDER — PATIROMER SORBITEX CALCIUM 8.4 G PO PACK
16.8000 g | PACK | ORAL | Status: DC
  Filled 2018-08-18: qty 2

## 2018-08-18 MED ORDER — SODIUM CHLORIDE 0.9 % IV SOLN
1.0000 g | Freq: Once | INTRAVENOUS | Status: AC
  Administered 2018-08-18: 1 g via INTRAVENOUS
  Filled 2018-08-18: qty 10

## 2018-08-18 MED ORDER — PENTAFLUOROPROP-TETRAFLUOROETH EX AERO: 1.0000 "application " | INHALATION_SPRAY | CUTANEOUS | Status: DC | PRN

## 2018-08-18 MED ORDER — HEPARIN SODIUM (PORCINE) 1000 UNIT/ML DIALYSIS: 20.0000 [IU]/kg | INTRAMUSCULAR | Status: DC | PRN

## 2018-08-18 MED ORDER — LIDOCAINE-PRILOCAINE 2.5-2.5 % EX CREA: 1.0000 "application " | TOPICAL_CREAM | CUTANEOUS | Status: DC | PRN

## 2018-08-18 MED ORDER — HEPARIN SODIUM (PORCINE) 1000 UNIT/ML DIALYSIS: 1000.0000 [IU] | INTRAMUSCULAR | Status: DC | PRN

## 2018-08-18 MED ORDER — CALCITRIOL 0.25 MCG PO CAPS
1.0000 ug | ORAL_CAPSULE | ORAL | Status: DC
  Administered 2018-08-19 – 2018-08-24 (×3): 1 ug via ORAL
  Filled 2018-08-18 (×3): qty 4

## 2018-08-18 MED ORDER — CHLORHEXIDINE GLUCONATE CLOTH 2 % EX PADS
6.0000 | MEDICATED_PAD | Freq: Every day | CUTANEOUS | Status: DC
  Administered 2018-08-20: 6 via TOPICAL

## 2018-08-18 MED ORDER — PATIROMER SORBITEX CALCIUM 8.4 G PO PACK
16.8000 g | PACK | ORAL | Status: AC
  Administered 2018-08-18: 16.8 g via ORAL
  Filled 2018-08-18: qty 2

## 2018-08-18 MED ORDER — INSULIN DETEMIR 100 UNIT/ML ~~LOC~~ SOLN
5.0000 [IU] | Freq: Two times a day (BID) | SUBCUTANEOUS | Status: DC
  Administered 2018-08-18 – 2018-08-20 (×5): 5 [IU] via SUBCUTANEOUS
  Filled 2018-08-18 (×6): qty 0.05

## 2018-08-18 MED ORDER — ALTEPLASE 2 MG IJ SOLR: 2.0000 mg | Freq: Once | INTRAMUSCULAR | Status: DC | PRN

## 2018-08-18 NOTE — Plan of Care (Signed)
  Problem: Pain Managment: Goal: General experience of comfort will improve Outcome: Progressing   

## 2018-08-18 NOTE — Progress Notes (Signed)
Patient was seen at bedside. K 7.3 noted, ordered Veltassa and calcium gluconate. Plan for urgent dialysis, 2 K. Full consult note to follow.

## 2018-08-18 NOTE — Procedures (Signed)
Patient was seen on dialysis and the procedure was supervised.  BFR 400  Via AVF BP is  104/45.  BP running low, will lower UF to 1-1.5L as tolerated.   Patient appears to be tolerating treatment well  Christian Dalton 08/18/2018
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
## 2018-08-18 NOTE — Consult Note (Signed)
[facility] Renal Consultation Note    Indication for Consultation:  Management of ESRD/hemodialysis; anemia, hypertension/volume and secondary hyperparathyroidism  HPI: Christian Dalton is a 77 y.o. male with ESRD on HD TTS at [facility]. PMH DM, HTN, COPD, OSA.   Admitted from [facility] last night for evaluation of abdominal pain. Prior ultrasound showed acute cholecystis and and general surgery was consulted. This morning found to have severe hyperkalemia with K 7.3. [place] and calcium gluconate ordered urgent dialysis started. Labs also notable for elevated WBCs 18.4, BUN 103, Cr 11.8, Hgb 15.9, A1C 9.7%.   Seen now in HD unit. Did receive morphine at previous hospital. He remains very sleepy, lethargic. Unable to get much history from him. He missed HD Tuesday, with hospital admission at [facility]. He does not remember this.   He has otherwise been compliant with HD. Last HD was Saturday -he completed his full treatment and reached his dry weight.   Past Medical History:  Diagnosis Date  . CAD (coronary artery disease)   . COPD (chronic obstructive pulmonary disease) ([place])   . Diabetes mellitus without complication ([place])   . ESRD (end stage renal disease) on dialysis ([place])   . Hypertension   . OSA (obstructive sleep apnea)   . Renal disorder   . Spinal stenosis    Past Surgical History:  Procedure Laterality Date  . HAND SURGERY Right   . SHOULDER SURGERY Right   . TIBIA FRACTURE SURGERY     History reviewed. No pertinent family history. Social History:  reports that he has never smoked. He has quit using smokeless tobacco. He reports that he drinks alcohol. He reports that he does not use drugs. Allergies  Allergen Reactions  . Avelox [Moxifloxacin Hcl]   . Sulfa Antibiotics   . Tetracyclines & Related    Prior to Admission medications   Medication Sig Start Date End Date Taking? Authorizing Provider  aspirin EC 81 MG tablet Take 81 mg  every evening by mouth.    [provider]  atorvastatin (LIPITOR) 20 MG tablet Take 20 mg every evening by mouth. 01/29/15   [provider]  buPROPion (WELLBUTRIN XL) 150 MG 24 hr tablet Take 150 mg daily by mouth.    [provider]  docusate sodium (COLACE) 100 MG capsule Take 100 mg 3 (three) times daily by mouth.    [provider]  escitalopram (LEXAPRO) 10 MG tablet Take 10 mg every evening by mouth. 08/30/15   [provider]  HYDROcodone-acetaminophen (NORCO) 10-325 MG tablet Take 1 tablet every 8 (eight) hours as needed by mouth for pain. 01/16/17   [provider]  insulin detemir (LEVEMIR) 100 UNIT/ML injection Inject 50 Units 2 (two) times daily into the skin.    [provider]  insulin lispro (HUMALOG) 100 UNIT/ML injection Inject 10 Units 2 (two) times daily into the skin.    [provider]  levothyroxine (SYNTHROID, LEVOTHROID) 125 MCG tablet Take 125 mcg daily by mouth.    [provider]  linaclotide (LINZESS) 145 MCG CAPS capsule Take 145 mcg daily after supper by mouth.    [provider]  loratadine (CLARITIN) 10 MG tablet Take 10 mg every evening by mouth.    [provider]  montelukast (SINGULAIR) 10 MG tablet Take 10 mg every evening by mouth.    [provider]  oxyCODONE-acetaminophen (PERCOCET) 5-325 MG tablet Take 1-2 tablets by mouth every 6 (six) hours as needed. Patient not taking:  Reported on 10/05/2017 05/16/16   Veryl Speak, MD  polyethylene glycol (MIRALAX / Floria Raveling) packet Take 17 g daily after supper by mouth.    [provider]  pregabalin (LYRICA) 100 MG capsule Take 100 mg 2 (two) times daily by mouth. 07/23/15   [provider]  sevelamer carbonate (RENVELA) 800 MG tablet Take 1,600 mg 3 (three) times daily by mouth.    [provider]   Current Facility-Administered Medications  Medication Dose Route Frequency Provider  Last Rate Last Dose  . acetaminophen (TYLENOL) tablet 650 mg  650 mg Oral Q6H PRN Johnson-Pitts, Endia, MD       Or  . acetaminophen (TYLENOL) suppository 650 mg  650 mg Rectal Q6H PRN Johnson-Pitts, Endia, MD      . calcium gluconate 1 g in sodium chloride 0.9 % 100 mL IVPB  1 g Intravenous Once Rosita Fire, MD      . Chlorhexidine Gluconate Cloth 2 % PADS 6 each  6 each Topical Q0600 Lynnda Child, PA-C      . HYDROcodone-acetaminophen (NORCO/VICODIN) 5-325 MG per tablet 1-2 tablet  1-2 tablet Oral Q4H PRN Johnson-Pitts, Endia, MD      . Influenza vac split quadrivalent PF (FLUZONE HIGH-DOSE) injection 0.5 mL  0.5 mL Intramuscular Tomorrow-1000 Johnson-Pitts, Endia, MD      . insulin aspart (novoLOG) injection 0-9 Units  0-9 Units Subcutaneous TID WC Johnson-Pitts, Endia, MD   9 Units at 08/18/18 0824  . insulin detemir (LEVEMIR) injection 5 Units  5 Units Subcutaneous BID Charlynne Cousins, MD      . ondansetron [facility]) tablet 4 mg  4 mg Oral Q6H PRN Johnson-Pitts, Endia, MD       Or  . ondansetron (ZOFRAN) injection 4 mg  4 mg Intravenous Q6H PRN Johnson-Pitts, Endia, MD      . piperacillin-tazobactam (ZOSYN) IVPB 3.375 g  3.375 g Intravenous Q12H Hammons, Kimberly B, RPH 12.5 mL/hr at 08/17/18 2259 3.375 g at 08/17/18 2259  . senna-docusate (Senokot-S) tablet 1 tablet  1 tablet Oral QHS PRN Johnson-Pitts, Endia, MD        ROS: As per HPI otherwise negative.  Physical Exam: Vitals:   08/17/18 1800 08/17/18 2207 08/18/18 0343 08/18/18 0813  BP: (!) 155/76 131/77 (!) 102/59 108/68  Pulse: (!) 105 (!) 110 100 97  Resp: 18 20 (!) 22 16  Temp: 98.5 F (36.9 C) 98.6 F (37 C) 99.1 F (37.3 C) 98.8 F (37.1 C)  TempSrc: Oral Oral Axillary Oral  SpO2:  92% 95% 90%     General: Obese male NAD Head: NCAT sclera not icteric MMM Neck: Supple. No JVD No masses Lungs: CTA bilaterally without wheezes, rales, or rhonchi. Breathing is unlabored. Heart: RRR with S1  S2 Abdomen: soft NT + BS Lower extremities:without edema or ischemic changes, no open wounds  Neuro: Sleepy, lethargic. Unable to answer questions   Dialysis Access: LUE AVF cannulated on HD   Labs: Basic Metabolic Panel: Recent Labs  Lab 08/18/18 0605  NA 137  K 7.3*  CL 94*  CO2 22  GLUCOSE 382*  BUN 103*  CREATININE 11.80*  CALCIUM 8.8*   Liver Function Tests: Recent Labs  Lab 08/18/18 0605  AST 303*  ALT 499*  ALKPHOS 333*  BILITOT 4.4*  PROT 6.4*  ALBUMIN 3.0*   No results for input(s): LIPASE, AMYLASE in the last 168 hours. No results for input(s): AMMONIA in the last 168 hours. CBC: Recent Labs  Lab  08/18/18 0605  WBC 18.4*  HGB 15.9  HCT 49.0  MCV 96.3  PLT 156   Cardiac Enzymes: No results for input(s): CKTOTAL, CKMB, CKMBINDEX, TROPONINI in the last 168 hours. CBG: Recent Labs  Lab 08/17/18 2205 08/18/18 0036 08/18/18 0341 08/18/18 0818  GLUCAP 419* 396* 373* 374*   Iron Studies: No results for input(s): IRON, TIBC, TRANSFERRIN, FERRITIN in the last 72 hours. Studies/Results: No results found.  Dialysis Orders:  [place] TTS 4h 500/800 EDW 118kg 2K/2.25Ca L AVF Heparin bolus 7000 U Calcitriol 1.0 mcg TIW Auyrxia 3 tabs qac   Assessment/Plan: 1. Abdominal pain - Acute cholecystis on prior studies. WBCs/LFTs elevated. IV Zosyn started. Gen surg following.  2. Hyperkalemia - Missed HD Tues with hosp adm. . Will correct with meds/urgent HD today.  3. ESRD -  HD TTS. HD off schedule today for ^K. Back on schedule tomorrow.  4. Hypertension/volume  - BP stable/controlled. No gross excess on exam. UF to EDW as tolerated.  5. Anemia  - Hgb 15.9. No ESA needs  6. Metabolic bone disease -  Ca ok. Continue VDRA/binders  7. Nutrition - Renal diet/vitamins. Prostat for low albumin when eating  8. DM - HgbA1c 9.7% - per primary   Lynnda Child PA-C [place] Pager [phone] 08/18/2018, 10:07 AM

## 2018-08-18 NOTE — Progress Notes (Signed)
Patient refused CPAP HS tonight 

## 2018-08-18 NOTE — Consult Note (Signed)
Referring Provider:  Saverio Danker, PA- C Primary Care Physician:  Street, Sharon Mt, MD Primary Gastroenterologist: Althia Forts  Reason for Consultation: Abnormal LFTs, acute cholecystitis  HPI: Christian Dalton is a 77 y.o. male with past medical history of end-stage renal disease on hemodialysis, history of coronary artery disease, history of COPD transferred from [facility] for further management.  He presented to end of hospital with abdominal pain.  Ultrasound showed mild thickening of the gallbladder wall as well as gallstones.  Initially held mild elevated LFTs but now it is trending up.  GI is consulted for further evaluation.   Patient seen and examined in dialysis unit.  He was somewhat lethargic and sleepy.  Unable to get detailed history from the patient.  Complaining of intermittent abdominal pain.  Chart from another hospital reviewed.  He presented for acute onset of epigastric abdominal pain.  He had normal LFTs and normal lipase over there.  CT scan showed gallstone at the neck of the gallbladder.  Follow-up ultrasound showed 17 mm stone and evidence of acute cholecystitis.   Past Medical History:  Diagnosis Date  . CAD (coronary artery disease)   . COPD (chronic obstructive pulmonary disease) ([place])   . Diabetes mellitus without complication ([place])   . ESRD (end stage renal disease) on dialysis ([place])   . Hypertension   . OSA (obstructive sleep apnea)   . Renal disorder   . Spinal stenosis     Past Surgical History:  Procedure Laterality Date  . HAND SURGERY Right   . SHOULDER SURGERY Right   . TIBIA FRACTURE SURGERY      Prior to Admission medications   Medication Sig Start Date End Date Taking? Authorizing Provider  aspirin EC 81 MG tablet Take 81 mg every evening by mouth.    [provider]  atorvastatin (LIPITOR) 20 MG tablet Take 20 mg every evening by mouth. 01/29/15   [provider]  buPROPion (WELLBUTRIN XL) 150 MG 24 hr tablet Take  150 mg daily by mouth.    [provider]  docusate sodium (COLACE) 100 MG capsule Take 100 mg 3 (three) times daily by mouth.    [provider]  escitalopram (LEXAPRO) 10 MG tablet Take 10 mg every evening by mouth. 08/30/15   [provider]  HYDROcodone-acetaminophen (NORCO) 10-325 MG tablet Take 1 tablet every 8 (eight) hours as needed by mouth for pain. 01/16/17   [provider]  insulin detemir (LEVEMIR) 100 UNIT/ML injection Inject 50 Units 2 (two) times daily into the skin.    [provider]  insulin lispro (HUMALOG) 100 UNIT/ML injection Inject 10 Units 2 (two) times daily into the skin.    [provider]  levothyroxine (SYNTHROID, LEVOTHROID) 125 MCG tablet Take 125 mcg daily by mouth.    [provider]  linaclotide (LINZESS) 145 MCG CAPS capsule Take 145 mcg daily after supper by mouth.    [provider]  loratadine (CLARITIN) 10 MG tablet Take 10 mg every evening by mouth.    [provider]  montelukast (SINGULAIR) 10 MG tablet Take 10 mg every evening by mouth.    [provider]  oxyCODONE-acetaminophen (PERCOCET) 5-325 MG tablet Take 1-2 tablets by mouth every 6 (six) hours as needed. Patient not taking: Reported on 10/05/2017 05/16/16   Veryl Speak, MD  polyethylene glycol (MIRALAX / GLYCOLAX) packet Take 17 g daily after supper by mouth.    [provider]  pregabalin (LYRICA) 100 MG capsule Take  100 mg 2 (two) times daily by mouth. 07/23/15   [provider]  sevelamer carbonate (RENVELA) 800 MG tablet Take 1,600 mg 3 (three) times daily by mouth.    [provider]    Scheduled Meds: . [START ON 08/19/2018] calcitRIOL  1 mcg Oral Q T,Th,Sa-HD  . Chlorhexidine Gluconate Cloth  6 each Topical Q0600  . Influenza vac split quadrivalent PF  0.5 mL Intramuscular Tomorrow-1000  . insulin aspart  0-9 Units Subcutaneous TID WC  . insulin detemir  5 Units  Subcutaneous BID   Continuous Infusions: . sodium chloride    . sodium chloride    . calcium gluconate    . piperacillin-tazobactam (ZOSYN)  IV 3.375 g (08/17/18 2259)   PRN Meds:.sodium chloride, sodium chloride, acetaminophen **OR** acetaminophen, alteplase, heparin, heparin, HYDROcodone-acetaminophen, lidocaine (PF), lidocaine-prilocaine, ondansetron **OR** ondansetron (ZOFRAN) IV, pentafluoroprop-tetrafluoroeth, senna-docusate  Allergies as of 08/17/2018 - Review Complete 08/17/2018  Allergen Reaction Noted  . Avelox [moxifloxacin hcl]  10/05/2017  . Sulfa antibiotics  05/16/2016  . Tetracyclines & related  05/16/2016    History reviewed. No pertinent family history.  Social History   Socioeconomic History  . Marital status: Married    Spouse name: Di Kindle  . Number of children: Not on file  . Years of education: Not on file  . Highest education level: Not on file  Occupational History  . Not on file  Social Needs  . Financial resource strain: Not hard at all  . Food insecurity:    Worry: Patient refused    Inability: Patient refused  . Transportation needs:    Medical: Patient refused    Non-medical: Patient refused  Tobacco Use  . Smoking status: Never Smoker  . Smokeless tobacco: Former Network engineer and Sexual Activity  . Alcohol use: Yes  . Drug use: No  . Sexual activity: Not on file  Lifestyle  . Physical activity:    Days per week: Patient refused    Minutes per session: Patient refused  . Stress: Only a little  Relationships  . Social connections:    Talks on phone: Not on file    Gets together: Not on file    Attends religious service: Not on file    Active member of club or organization: Not on file    Attends meetings of clubs or organizations: Not on file    Relationship status: Not on file  . Intimate partner violence:    Fear of current or ex partner: Not on file    Emotionally abused: Not on file    Physically abused: Not on file     Forced sexual activity: Not on file  Other Topics Concern  . Not on file  Social History Narrative  . Not on file    Review of Systems: Not able to obtain  Physical Exam: Vital signs: Vitals:   08/18/18 1045 08/18/18 1115  BP: (!) 88/42 (!) 87/65  Pulse: 98 (!) 102  Resp:    Temp:    SpO2:  98%   Last BM Date: 08/17/18 Physical Exam  Constitutional: He appears well-developed and well-nourished. No distress.  HENT:  Head: Atraumatic.  Not able to perform oropharyngeal exam.  Neck: Normal range of motion. Neck supple.  Cardiovascular: Normal rate, regular rhythm and normal heart sounds.  Pulmonary/Chest:  Decreased breath sounds bilaterally.  Anterior exam only.  Abdominal: Bowel sounds are normal. He exhibits distension. There is tenderness. There is no rebound and no guarding.  Abdominal distention  as well as right upper quadrant tenderness to palpation noted.  No peritoneal signs  Musculoskeletal: Normal range of motion. He exhibits no edema.  Neurological:  Lethargic and sleepy.  Skin: Skin is warm. No erythema.  Psychiatric:  Not able to evaluate  Vitals reviewed.   GI:  Lab Results: Recent Labs    08/18/18 0605  WBC 18.4*  HGB 15.9  HCT 49.0  PLT 156   BMET Recent Labs    08/18/18 0605  NA 137  K 7.3*  CL 94*  CO2 22  GLUCOSE 382*  BUN 103*  CREATININE 11.80*  CALCIUM 8.8*   LFT Recent Labs    08/18/18 0605  PROT 6.4*  ALBUMIN 3.0*  AST 303*  ALT 499*  ALKPHOS 333*  BILITOT 4.4*   PT/INR No results for input(s): LABPROT, INR in the last 72 hours.   Studies/Results: No results found.  Impression/Plan: -Gastric/right upper quadrant abdominal pain now with elevated LFTs.  He had normal LFTs and normal lipase at outside hospital.  CT scan showed gallstone at the neck of the gallbladder.  Follow-up ultrasound showed 17 mm stone and evidence of acute cholecystitis. ??  Passed gallstone. -End-stage renal disease.  On  hemodialysis. -Leukocytosis . patient is afebrile.  Recommendations ---------------------------- -Check MRCP without contrast -Currently on Zosyn. -Recheck LFTs in the morning.  Check hepatitis panel. -GI will follow.     LOS: 1 day   Otis Brace  MD, FACP 08/18/2018, 11:47 AM  Contact #  [phone]

## 2018-08-18 NOTE — Progress Notes (Signed)
Patient 4 side rails up for safety Arthor Captain LPN

## 2018-08-18 NOTE — Progress Notes (Signed)
[facility] PROGRESS NOTE    Progress Note  Christian Dalton  TIR:[id number] DOB: 1941/03/08 DOA: 08/17/2018 PCP: Emmaline Kluver, MD     Brief Narrative:   Christian Dalton is an 77 y.o. male past medical history significant of end-stage renal disease on hemodialysis, diabetes and hypertension resents with epigastric pain, an ultrasound showed acute cholecystitis. General surgery was consulted.  Assessment/Plan:   Acute cholecystitis: Surgery has been consulted we will continue supportive care awaiting recommendations. We will continue IV Zosyn.  On a clear liquid diet.  ESRD (end stage renal disease) on dialysis [facility]): Continue dialysis.  Has been consulted.  DM2 (diabetes mellitus, type 2) ([place]): Place on a clear liquid diet continue sliding scale insulin CBGs every 4. Decrease her long-acting insulin to 5 twice a day  Essential HTN (hypertension): Well-controlled no antihypertensive medication.  Obesity, Class III, BMI 40-49.9 (morbid obesity) ([place])    DVT prophylaxis: lovenox Family Communication:none Disposition Plan/Barrier to D/C: home in 3 days Code Status:     Code Status Orders  (From admission, onward)         Start     Ordered   08/17/18 1855  Full code  Continuous     08/17/18 1856        Code Status History    This patient has a current code status but no historical code status.        IV Access:    Peripheral IV   Procedures and diagnostic studies:   No results found.   Medical Consultants:    None.  Anti-Infectives:   Zosyn  Subjective:    Christian Dalton pain controlled no complains  Objective:    Vitals:   08/17/18 1800 08/17/18 2207 08/18/18 0343  BP: (!) 155/76 131/77 (!) 102/59  Pulse: (!) 105 (!) 110 100  Resp: 18 20 (!) 22  Temp: 98.5 F (36.9 C) 98.6 F (37 C) 99.1 F (37.3 C)  TempSrc: Oral Oral Axillary  SpO2:  92% 95%    Intake/Output Summary (Last 24 hours) at 08/18/2018 0810 Last data  filed at 08/17/2018 1800 Gross per 24 hour  Intake 0 ml  Output -  Net 0 ml   There were no vitals filed for this visit.  Exam: General exam: In no acute distress. Respiratory system: Good air movement and clear to auscultation. Cardiovascular system: S1 & S2 heard, RRR. No JVD.Marland Kitchen  Gastrointestinal system: Abdomen is nondistended, soft and nontender.  Central nervous system: Alert and oriented. No focal neurological deficits. Extremities: No pedal edema. Skin: No rashes, lesions or ulcers Psychiatry: Judgement and insight appear normal. Mood & affect appropriate.    Data Reviewed:    Labs: Basic Metabolic Panel: No results for input(s): NA, K, CL, CO2, GLUCOSE, BUN, CREATININE, CALCIUM, MG, PHOS in the last 168 hours. GFR CrCl cannot be calculated (Patient's most recent lab result is older than the maximum 21 days allowed.). Liver Function Tests: No results for input(s): AST, ALT, ALKPHOS, BILITOT, PROT, ALBUMIN in the last 168 hours. No results for input(s): LIPASE, AMYLASE in the last 168 hours. No results for input(s): AMMONIA in the last 168 hours. Coagulation profile No results for input(s): INR, PROTIME in the last 168 hours.  CBC: Recent Labs  Lab 08/18/18 0605  WBC 18.4*  HGB 15.9  HCT 49.0  MCV 96.3  PLT 156   Cardiac Enzymes: No results for input(s): CKTOTAL, CKMB, CKMBINDEX, TROPONINI in the last 168 hours. BNP (last 3 results) No results for  input(s): PROBNP in the last 8760 hours. CBG: Recent Labs  Lab 08/17/18 2205 08/18/18 0036 08/18/18 0341  GLUCAP 419* 396* 373*   D-Dimer: No results for input(s): DDIMER in the last 72 hours. Hgb A1c: Recent Labs    08/17/18 1918  HGBA1C 9.7*   Lipid Profile: No results for input(s): CHOL, HDL, LDLCALC, TRIG, CHOLHDL, LDLDIRECT in the last 72 hours. Thyroid function studies: No results for input(s): TSH, T4TOTAL, T3FREE, THYROIDAB in the last 72 hours.  Invalid input(s): FREET3 Anemia work up: No  results for input(s): VITAMINB12, FOLATE, FERRITIN, TIBC, IRON, RETICCTPCT in the last 72 hours. Sepsis Labs: Recent Labs  Lab 08/18/18 0605  WBC 18.4*   Microbiology Recent Results (from the past 240 hour(s))  Surgical pcr screen     Status: None   Collection Time: 08/17/18  8:59 PM  Result Value Ref Range Status   MRSA, PCR NEGATIVE NEGATIVE Final   Staphylococcus aureus NEGATIVE NEGATIVE Final    Comment: (NOTE) The Xpert SA Assay (FDA approved for NASAL specimens in patients 60 years of age and older), is one component of a comprehensive surveillance program. It is not intended to diagnose infection nor to guide or monitor treatment. Performed at [facility], [place] [address] [place], [place] [address]      Medications:   . Influenza vac split quadrivalent PF  0.5 mL Intramuscular Tomorrow-1000  . insulin aspart  0-9 Units Subcutaneous TID WC   Continuous Infusions: . piperacillin-tazobactam (ZOSYN)  IV 3.375 g (08/17/18 2259)      LOS: 1 day   Charlynne Cousins  [facility] Pager [phone]  *Please refer to [email or web address], password [email or web address] to get updated schedule on who will round on this patient, as hospitalists switch teams weekly. If 7PM-7AM, please contact night-coverage at [email or web address], password [email or web address] for any overnight needs.  08/18/2018, 8:10 AM

## 2018-08-19 ENCOUNTER — Encounter (HOSPITAL_COMMUNITY): Payer: Self-pay | Admitting: Interventional Radiology

## 2018-08-19 ENCOUNTER — Inpatient Hospital Stay (HOSPITAL_COMMUNITY): Payer: Medicare Other

## 2018-08-19 DIAGNOSIS — G92 Toxic encephalopathy: Secondary | ICD-10-CM

## 2018-08-19 DIAGNOSIS — K819 Cholecystitis, unspecified: Secondary | ICD-10-CM

## 2018-08-19 HISTORY — PX: IR PERC CHOLECYSTOSTOMY: IMG2326

## 2018-08-19 LAB — COMPREHENSIVE METABOLIC PANEL
ALT: 278 U/L — ABNORMAL HIGH (ref 0–44)
AST: 91 U/L — ABNORMAL HIGH (ref 15–41)
Albumin: 2.7 g/dL — ABNORMAL LOW (ref 3.5–5.0)
Alkaline Phosphatase: 330 U/L — ABNORMAL HIGH (ref 38–126)
Anion gap: 18 — ABNORMAL HIGH (ref 5–15)
BUN: 67 mg/dL — ABNORMAL HIGH (ref 8–23)
CO2: 25 mmol/L (ref 22–32)
Calcium: 9.5 mg/dL (ref 8.9–10.3)
Chloride: 94 mmol/L — ABNORMAL LOW (ref 98–111)
Creatinine, Ser: 9.13 mg/dL — ABNORMAL HIGH (ref 0.61–1.24)
GFR calc Af Amer: 6 mL/min — ABNORMAL LOW (ref >60)
GFR calc non Af Amer: 5 mL/min — ABNORMAL LOW (ref >60)
Glucose, Bld: 269 mg/dL — ABNORMAL HIGH (ref 70–99)
Potassium: 5.7 mmol/L — ABNORMAL HIGH (ref 3.5–5.1)
Sodium: 137 mmol/L (ref 135–145)
Total Bilirubin: 4.5 mg/dL — ABNORMAL HIGH (ref 0.3–1.2)
Total Protein: 6.4 g/dL — ABNORMAL LOW (ref 6.5–8.1)

## 2018-08-19 LAB — CBC
HCT: 48 % (ref 39.0–52.0)
Hemoglobin: 15.6 g/dL (ref 13.0–17.0)
MCH: 30.9 pg (ref 26.0–34.0)
MCHC: 32.5 g/dL (ref 30.0–36.0)
MCV: 95 fL (ref 78.0–100.0)
Platelets: 148 10*3/uL — ABNORMAL LOW (ref 150–400)
RBC: 5.05 MIL/uL (ref 4.22–5.81)
RDW: 16.3 % — ABNORMAL HIGH (ref 11.5–15.5)
WBC: 16.4 10*3/uL — ABNORMAL HIGH (ref 4.0–10.5)

## 2018-08-19 LAB — GLUCOSE, CAPILLARY
Glucose-Capillary: 160 mg/dL — ABNORMAL HIGH (ref 70–99)
Glucose-Capillary: 269 mg/dL — ABNORMAL HIGH (ref 70–99)

## 2018-08-19 LAB — LIPASE, BLOOD: Lipase: 30 U/L (ref 11–51)

## 2018-08-19 LAB — PROTIME-INR
INR: 1.22
Prothrombin Time: 15.3 seconds — ABNORMAL HIGH (ref 11.4–15.2)

## 2018-08-19 MED ORDER — PENTAFLUOROPROP-TETRAFLUOROETH EX AERO: 1.0000 "application " | INHALATION_SPRAY | CUTANEOUS | Status: DC | PRN

## 2018-08-19 MED ORDER — LIDOCAINE HCL 1 % IJ SOLN
INTRAMUSCULAR | Status: AC
  Filled 2018-08-19: qty 20

## 2018-08-19 MED ORDER — FENTANYL CITRATE (PF) 100 MCG/2ML IJ SOLN
INTRAMUSCULAR | Status: AC
  Filled 2018-08-19: qty 2

## 2018-08-19 MED ORDER — CALCITRIOL 0.25 MCG PO CAPS
ORAL_CAPSULE | ORAL | Status: AC
  Filled 2018-08-19: qty 2

## 2018-08-19 MED ORDER — RENA-VITE PO TABS
1.0000 | ORAL_TABLET | Freq: Every day | ORAL | Status: DC
  Administered 2018-08-20 – 2018-08-23 (×5): 1 via ORAL
  Filled 2018-08-19 (×5): qty 1

## 2018-08-19 MED ORDER — HEPARIN SODIUM (PORCINE) 1000 UNIT/ML DIALYSIS
5000.0000 [IU] | Freq: Once | INTRAMUSCULAR | Status: AC
  Administered 2018-08-19: 5000 [IU] via INTRAVENOUS_CENTRAL

## 2018-08-19 MED ORDER — SODIUM CHLORIDE 0.9 % IV SOLN: 100.0000 mL | INTRAVENOUS | Status: DC | PRN

## 2018-08-19 MED ORDER — FERRIC CITRATE 1 GM 210 MG(FE) PO TABS
630.0000 mg | ORAL_TABLET | Freq: Three times a day (TID) | ORAL | Status: DC
  Administered 2018-08-19 – 2018-08-21 (×5): 630 mg via ORAL
  Filled 2018-08-19 (×8): qty 3

## 2018-08-19 MED ORDER — HEPARIN SODIUM (PORCINE) 1000 UNIT/ML DIALYSIS
1000.0000 [IU] | INTRAMUSCULAR | Status: DC | PRN
  Filled 2018-08-19: qty 1

## 2018-08-19 MED ORDER — HEPARIN SODIUM (PORCINE) 5000 UNIT/ML IJ SOLN
5000.0000 [IU] | Freq: Three times a day (TID) | INTRAMUSCULAR | Status: DC
  Administered 2018-08-20 – 2018-08-24 (×15): 5000 [IU] via SUBCUTANEOUS
  Filled 2018-08-19 (×14): qty 1

## 2018-08-19 MED ORDER — PRO-STAT SUGAR FREE PO LIQD
30.0000 mL | Freq: Two times a day (BID) | ORAL | Status: DC
  Administered 2018-08-19 – 2018-08-23 (×8): 30 mL via ORAL
  Filled 2018-08-19 (×12): qty 30

## 2018-08-19 MED ORDER — LIDOCAINE-PRILOCAINE 2.5-2.5 % EX CREA
1.0000 "application " | TOPICAL_CREAM | CUTANEOUS | Status: DC | PRN
  Filled 2018-08-19: qty 5

## 2018-08-19 MED ORDER — CALCITRIOL 0.5 MCG PO CAPS
ORAL_CAPSULE | ORAL | Status: AC
  Administered 2018-08-19: 0.25 ug
  Filled 2018-08-19: qty 2

## 2018-08-19 MED ORDER — MIDAZOLAM HCL 2 MG/2ML IJ SOLN
INTRAMUSCULAR | Status: AC
  Filled 2018-08-19: qty 2

## 2018-08-19 MED ORDER — IOPAMIDOL (ISOVUE-300) INJECTION 61%
INTRAVENOUS | Status: AC
  Administered 2018-08-19: 5 mL
  Filled 2018-08-19: qty 50

## 2018-08-19 MED ORDER — HEPARIN SODIUM (PORCINE) 1000 UNIT/ML IJ SOLN
INTRAMUSCULAR | Status: AC
  Administered 2018-08-19: 5000 [IU] via INTRAVENOUS_CENTRAL
  Filled 2018-08-19: qty 5

## 2018-08-19 MED ORDER — SODIUM CHLORIDE 0.9% FLUSH
5.0000 mL | Freq: Three times a day (TID) | INTRAVENOUS | Status: DC
  Administered 2018-08-19 – 2018-08-24 (×16): 5 mL

## 2018-08-19 MED ORDER — MIDAZOLAM HCL 2 MG/2ML IJ SOLN
INTRAMUSCULAR | Status: AC | PRN
  Administered 2018-08-19: 1 mg via INTRAVENOUS

## 2018-08-19 MED ORDER — LIDOCAINE HCL (PF) 1 % IJ SOLN
5.0000 mL | INTRAMUSCULAR | Status: DC | PRN
  Filled 2018-08-19 (×2): qty 5

## 2018-08-19 MED ORDER — FENTANYL CITRATE (PF) 100 MCG/2ML IJ SOLN
INTRAMUSCULAR | Status: AC | PRN
  Administered 2018-08-19: 50 ug via INTRAVENOUS

## 2018-08-19 MED ORDER — LIDOCAINE HCL (PF) 1 % IJ SOLN
INTRAMUSCULAR | Status: AC | PRN
  Administered 2018-08-19: 10 mL

## 2018-08-19 NOTE — Progress Notes (Signed)
  [facility] Progress Note   Subjective:  Seen on HD. Remains sleepy, lethargic  For IR gallbladder drain   Objective Vitals:   08/19/18 0900 08/19/18 0930 08/19/18 1000 08/19/18 1030  BP: (!) 99/48 (!) 104/55 (!) 110/51 (!) 100/52  Pulse: (!) 118 (!) 115 (!) 111 (!) 117  Resp:      Temp:      TempSrc:      SpO2:      Weight:       Physical Exam General: Obese male NAD  Heart: Tachy  Lungs: CTAB  Abdomen: soft ND,NT Extremities: No LE edema  Dialysis Access: LUE AVF cannulated on HD   [place] TTS 4h 500/800 EDW 118kg 2K/2.25Ca L AVF Heparin bolus 7000 U Calcitriol 1.0 mcg TIW Auyrxia 3 tabs qac tid  Assessment/Plan: 1. Acute cholecystis - WBCs/LFTs elevated. IV Zosyn started. Gen surg following. On Zosyn. For IR percutaneous chole drain today.  2. Hyperkalemia -K 7.3> 5.7 after HD yesterday. HD today on schedule using 1K bath.  Follow RFP. Will also check outpatient AFs  3. ESRD -  HD TTS.  4. Hypertension/volume  - BP stable/controlled. No gross excess on exam. UF to EDW as tolerated.  5. Anemia  - Hgb >15 No ESA needs  6. Metabolic bone disease -  Ca ok. Continue VDRA/binders  7. Nutrition - Renal diet/vitamins. Prostat for low albumin when eating  8. DM - HgbA1c 9.7% - per primary   Lynnda Child PA-C [place] Pager [phone] 08/19/2018,10:35 AM  LOS: 2 days   Additional Objective Labs: Basic Metabolic Panel: Recent Labs  Lab 08/18/18 0605 08/18/18 1423 08/19/18 0433  NA 137 140 137  K 7.3* 5.9* 5.7*  CL 94* 95* 94*  CO2 22 24 25   GLUCOSE 382* 186* 269*  BUN 103* 59* 67*  CREATININE 11.80* 8.36* 9.13*  CALCIUM 8.8* 9.0 9.5   CBC: Recent Labs  Lab 08/18/18 0605 08/19/18 0433  WBC 18.4* 16.4*  HGB 15.9 15.6  HCT 49.0 48.0  MCV 96.3 95.0  PLT 156 148*   Blood Culture No results found for: SDES, SPECREQUEST, CULT, REPTSTATUS  Cardiac Enzymes: No results for input(s): CKTOTAL, CKMB, CKMBINDEX,  TROPONINI in the last 168 hours. CBG: Recent Labs  Lab 08/18/18 0036 08/18/18 0341 08/18/18 0818 08/18/18 1750 08/18/18 2028  GLUCAP 396* 373* 374* 151* 197*   Iron Studies: No results for input(s): IRON, TIBC, TRANSFERRIN, FERRITIN in the last 72 hours. No results found for: INR, PROTIME Medications: . sodium chloride    . sodium chloride    . piperacillin-tazobactam (ZOSYN)  IV 3.375 g (08/19/18 0530)   . calcitRIOL  1 mcg Oral Q T,Th,Sa-HD  . Chlorhexidine Gluconate Cloth  6 each Topical Q0600  . Influenza vac split quadrivalent PF  0.5 mL Intramuscular Tomorrow-1000  . insulin aspart  0-9 Units Subcutaneous TID WC  . insulin detemir  5 Units Subcutaneous BID

## 2018-08-19 NOTE — Consult Note (Signed)
Chief Complaint: Patient was seen in consultation today for percutaneous cholecystostomy drain placement at the request of Dr Dennis Bast   Supervising Physician: Sandi Mariscal  Patient Status: [facility] - In-pt  History of Present Illness: Christian Dalton is a 77 y.o. male   ESRD- in HD now Epigastric pain Presented to [facility] yesterday CTA done: cardiac rules out-- but gallstones [place] then revealed wall thickening and gallstone Transferred to [facility] for management secondary ESRD needs  Since admission to [facility] He has become more lethargic Increased LFTs In HD now-- tachy and groggy  Request for percutaneous cholecystostomy drain per [facility] They have consulted pt-- feel not great candidate now for surgery  Worsening LFTs-- septic---confusion Plan for surgery in future Dr Pascal Lux has reviewed imaging and approves procedure  Past Medical History:  Diagnosis Date  . CAD (coronary artery disease)   . COPD (chronic obstructive pulmonary disease) ([place])   . Diabetes mellitus without complication ([place])   . ESRD (end stage renal disease) on dialysis ([place])   . Hypertension   . OSA (obstructive sleep apnea)   . Renal disorder   . Spinal stenosis     Past Surgical History:  Procedure Laterality Date  . HAND SURGERY Right   . SHOULDER SURGERY Right   . TIBIA FRACTURE SURGERY      Allergies: Avelox [moxifloxacin hcl]; Sulfa antibiotics; and Tetracyclines & related  Medications: Prior to Admission medications   Medication Sig Start Date End Date Taking? Authorizing Provider  aspirin EC 81 MG tablet Take 81 mg every evening by mouth.    [provider]  atorvastatin (LIPITOR) 20 MG tablet Take 20 mg every evening by mouth. 01/29/15   [provider]  buPROPion (WELLBUTRIN XL) 150 MG 24 hr tablet Take 150 mg daily by mouth.    [provider]  docusate sodium (COLACE) 100 MG capsule Take 100 mg 3 (three) times daily by mouth.    [provider]    escitalopram (LEXAPRO) 10 MG tablet Take 10 mg every evening by mouth. 08/30/15   [provider]  HYDROcodone-acetaminophen (NORCO) 10-325 MG tablet Take 1 tablet every 8 (eight) hours as needed by mouth for pain. 01/16/17   [provider]  insulin detemir (LEVEMIR) 100 UNIT/ML injection Inject 50 Units 2 (two) times daily into the skin.    [provider]  insulin lispro (HUMALOG) 100 UNIT/ML injection Inject 10 Units 2 (two) times daily into the skin.    [provider]  levothyroxine (SYNTHROID, LEVOTHROID) 125 MCG tablet Take 125 mcg daily by mouth.    [provider]  linaclotide (LINZESS) 145 MCG CAPS capsule Take 145 mcg daily after supper by mouth.    [provider]  loratadine (CLARITIN) 10 MG tablet Take 10 mg every evening by mouth.    [provider]  montelukast (SINGULAIR) 10 MG tablet Take 10 mg every evening by mouth.    [provider]  oxyCODONE-acetaminophen (PERCOCET) 5-325 MG tablet Take 1-2 tablets by mouth every 6 (six) hours as needed. Patient not taking: Reported on 10/05/2017 05/16/16   Veryl Speak, MD  polyethylene glycol (MIRALAX / GLYCOLAX) packet Take 17 g daily after supper by mouth.    [provider]  pregabalin (LYRICA) 100 MG capsule Take 100 mg 2 (two) times daily by mouth. 07/23/15   [provider]  sevelamer carbonate (RENVELA) 800 MG tablet Take 1,600 mg 3 (three) times daily by mouth.    [provider]  History reviewed. No pertinent family history.  Social History   Socioeconomic History  . Marital status: Married    Spouse name: Di Kindle  . Number of children: Not on file  . Years of education: Not on file  . Highest education level: Not on file  Occupational History  . Not on file  Social Needs  . Financial resource strain: Not hard at all  . Food insecurity:    Worry: Patient refused    Inability: Patient refused  . Transportation needs:     Medical: Patient refused    Non-medical: Patient refused  Tobacco Use  . Smoking status: Never Smoker  . Smokeless tobacco: Former Network engineer and Sexual Activity  . Alcohol use: Yes  . Drug use: No  . Sexual activity: Not on file  Lifestyle  . Physical activity:    Days per week: Patient refused    Minutes per session: Patient refused  . Stress: Only a little  Relationships  . Social connections:    Talks on phone: Not on file    Gets together: Not on file    Attends religious service: Not on file    Active member of club or organization: Not on file    Attends meetings of clubs or organizations: Not on file    Relationship status: Not on file  Other Topics Concern  . Not on file  Social History Narrative  . Not on file    Review of Systems: A 12 point ROS discussed and pertinent positives are indicated in the HPI above.  All other systems are negative.  Review of Systems  Constitutional: Positive for activity change, diaphoresis and fatigue. Negative for fever.  Respiratory: Negative for shortness of breath.   Cardiovascular: Negative for chest pain.  Gastrointestinal: Positive for abdominal pain.  Neurological: Positive for weakness.  Psychiatric/Behavioral: Positive for decreased concentration. Negative for behavioral problems and confusion.    Vital Signs: BP (!) 99/48   Pulse (!) 118   Temp 98.4 F (36.9 C) (Oral)   Resp 18   Wt 267 lb 13.7 oz (121.5 kg)   SpO2 93%   BMI 39.56 kg/m   Physical Exam  Constitutional: He is oriented to person, place, and time.  Cardiovascular: Normal rate, regular rhythm and normal heart sounds.  Pulmonary/Chest: Effort normal and breath sounds normal.  Abdominal: Soft. He exhibits distension. There is tenderness.  Musculoskeletal: Normal range of motion.  Neurological: He is alert and oriented to person, place, and time.  Skin: Skin is warm and dry.  Psychiatric:  Consent with wife via phone  Vitals  reviewed.   Imaging: Mr Abdomen Mrcp Wo Contrast  Result Date: 08/18/2018 CLINICAL DATA:  Elevated liver function tests. Cholelithiasis. Findings suggestive of acute cholecystitis on recent CT and sonography studies. End-stage renal disease on hemodialysis. Abdominal pain. EXAM: MRI ABDOMEN WITHOUT CONTRAST  (INCLUDING MRCP) TECHNIQUE: Multiplanar multisequence MR imaging of the abdomen was performed. Heavily T2-weighted images of the biliary and pancreatic ducts were obtained, and three-dimensional MRCP images were rendered by post processing. COMPARISON:  08/17/2018 abdominal sonogram and CT abdomen/pelvis. FINDINGS: Lower chest: Hypoventilatory changes at the dependent lung bases. Hepatobiliary: Normal liver size and configuration. No hepatic steatosis. No liver mass. Mildly distended gallbladder. There is a 1.7 cm gallstone in the gallbladder neck. There is layering sludge in the gallbladder. There is new gas in the nondependent fundal gallbladder (series 3/image 25). Moderate diffuse gallbladder wall thickening with trace pericholecystic fluid. No biliary ductal dilatation. Common  bile duct diameter 2 mm. No choledocholithiasis. Pancreas: No pancreatic duct dilation. No pancreas divisum. There are several lobulated unilocular small cystic pancreatic lesions scattered throughout the pancreas, largest 1.1 cm in the pancreatic body (series 10/image 60), without appreciable wall thickening or internal complexity. Spleen: Normal size. No mass. Adrenals/Urinary Tract: Normal right adrenal. Left adrenal 1.4 cm nodule with significant loss of signal intensity on out of phase chemical shift imaging, diagnostic of an adenoma. No hydronephrosis. Symmetric renal atrophy. Numerous bilateral renal cysts, largest 2.8 cm in the posterior lower right kidney, several which demonstrate hemorrhagic/proteinaceous signal intensity on T1 imaging, incompletely characterized on this noncontrast scan. Stomach/Bowel: Normal  non-distended stomach. Visualized small and large bowel is normal caliber, with no bowel wall thickening. Vascular/Lymphatic: Nonaneurysmal abdominal aorta. No pathologically enlarged lymph nodes in the abdomen. Other: No abdominal ascites or focal fluid collection. Musculoskeletal: No aggressive appearing focal osseous lesions. IMPRESSION: 1. Cholelithiasis with 1.7 cm gallstone in the gallbladder neck. Moderate diffuse gallbladder wall thickening with trace pericholecystic fluid. New gas in nondependent fundal gallbladder. Findings are compatible with acute emphysematous cholecystitis. 2. No biliary ductal dilatation. CBD diameter 2 mm. No choledocholithiasis. 3. 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 Follow-up MRI abdomen recommended in 2 years. This recommendation follows ACR consensus guidelines: Management of Incidental Pancreatic Cysts: A White Paper of the ACR Incidental Findings Committee. J Am Coll Radiol 4098;11:914-782. 4. Atrophic kidneys with numerous incompletely characterized renal cysts, several of which are hemorrhagic/proteinaceous. Suggest attention on follow-up MRI abdomen in 6 months. 5. Left adrenal adenoma. Electronically Signed   By: Ilona Sorrel M.D.   On: 08/18/2018 23:59   Mr 3d Recon At Scanner  Result Date: 08/18/2018 CLINICAL DATA:  Elevated liver function tests. Cholelithiasis. Findings suggestive of acute cholecystitis on recent CT and sonography studies. End-stage renal disease on hemodialysis. Abdominal pain. EXAM: MRI ABDOMEN WITHOUT CONTRAST  (INCLUDING MRCP) TECHNIQUE: Multiplanar multisequence MR imaging of the abdomen was performed. Heavily T2-weighted images of the biliary and pancreatic ducts were obtained, and three-dimensional MRCP images were rendered by post processing. COMPARISON:  08/17/2018 abdominal sonogram and CT abdomen/pelvis. FINDINGS: Lower chest: Hypoventilatory changes at the dependent lung bases. Hepatobiliary: Normal  liver size and configuration. No hepatic steatosis. No liver mass. Mildly distended gallbladder. There is a 1.7 cm gallstone in the gallbladder neck. There is layering sludge in the gallbladder. There is new gas in the nondependent fundal gallbladder (series 3/image 25). Moderate diffuse gallbladder wall thickening with trace pericholecystic fluid. No biliary ductal dilatation. Common bile duct diameter 2 mm. No choledocholithiasis. Pancreas: No pancreatic duct dilation. No pancreas divisum. There are several lobulated unilocular small cystic pancreatic lesions scattered throughout the pancreas, largest 1.1 cm in the pancreatic body (series 10/image 60), without appreciable wall thickening or internal complexity. Spleen: Normal size. No mass. Adrenals/Urinary Tract: Normal right adrenal. Left adrenal 1.4 cm nodule with significant loss of signal intensity on out of phase chemical shift imaging, diagnostic of an adenoma. No hydronephrosis. Symmetric renal atrophy. Numerous bilateral renal cysts, largest 2.8 cm in the posterior lower right kidney, several which demonstrate hemorrhagic/proteinaceous signal intensity on T1 imaging, incompletely characterized on this noncontrast scan. Stomach/Bowel: Normal non-distended stomach. Visualized small and large bowel is normal caliber, with no bowel wall thickening. Vascular/Lymphatic: Nonaneurysmal abdominal aorta. No pathologically enlarged lymph nodes in the abdomen. Other: No abdominal ascites or focal fluid collection. Musculoskeletal: No aggressive appearing focal osseous lesions. IMPRESSION: 1. Cholelithiasis with 1.7 cm gallstone in the gallbladder neck. Moderate  diffuse gallbladder wall thickening with trace pericholecystic fluid. New gas in nondependent fundal gallbladder. Findings are compatible with acute emphysematous cholecystitis. 2. No biliary ductal dilatation. CBD diameter 2 mm. No choledocholithiasis. 3. Nonaggressive cystic pancreatic lesions, largest 1.1  cm, probably IPMNs. Follow-up MRI abdomen recommended in 2 years. This recommendation follows ACR consensus guidelines: Management of Incidental Pancreatic Cysts: A White Paper of the ACR Incidental Findings Committee. J Am Coll Radiol 5726;20:355-974. 4. Atrophic kidneys with numerous incompletely characterized renal cysts, several of which are hemorrhagic/proteinaceous. Suggest attention on follow-up MRI abdomen in 6 months. 5. Left adrenal adenoma. Electronically Signed   By: Ilona Sorrel M.D.   On: 08/18/2018 23:59    Labs:  CBC: Recent Labs    10/05/17 1929 08/18/18 0605 08/19/18 0433  WBC 7.6 18.4* 16.4*  HGB 14.6 15.9 15.6  HCT 45.3 49.0 48.0  PLT 159 156 148*    COAGS: No results for input(s): INR, APTT in the last 8760 hours.  BMP: Recent Labs    10/05/17 1929 08/18/18 0605 08/18/18 1423 08/19/18 0433  NA 138 137 140 137  K 4.3 7.3* 5.9* 5.7*  CL 99* 94* 95* 94*  CO2 27 22 24 25   GLUCOSE 111* 382* 186* 269*  BUN 52* 103* 59* 67*  CALCIUM 8.9 8.8* 9.0 9.5  CREATININE 7.74* 11.80* 8.36* 9.13*  GFRNONAA 6* 4* 5* 5*  GFRAA 7* 4* 6* 6*    LIVER FUNCTION TESTS: Recent Labs    08/18/18 0605 08/18/18 1423 08/19/18 0433  BILITOT 4.4* 5.0* 4.5*  AST 303* 206* 91*  ALT 499* 416* 278*  ALKPHOS 333* 332* 330*  PROT 6.4* 6.3* 6.4*  ALBUMIN 3.0* 2.9* 2.7*    TUMOR MARKERS: No results for input(s): AFPTM, CEA, CA199, CHROMGRNA in the last 8760 hours.  Assessment and Plan:  Cholelithiasis/cholecystitis Sepsis Confusion Scheduled for percutaneous cholecystostomy drain placement Risks and benefits discussed with the patient's wife Christian Dalton via phone including, but not limited to bleeding, infection, gallbladder perforation, bile leak, sepsis or even death.  All of her questions were answered, she is agreeable to proceed. Consent signed and in chart.   Thank you for this interesting consult.  I greatly enjoyed meeting Christian Dalton and look forward to  participating in their care.  A copy of this report was sent to the requesting provider on this date.  Electronically Signed: Lavonia Drafts, PA-C 08/19/2018, 9:29 AM   I spent a total of 40 Minutes    in face to face in clinical consultation, greater than 50% of which was counseling/coordinating care for perc chole drain

## 2018-08-19 NOTE — Procedures (Addendum)
Patient was seen on dialysis and the procedure was supervised.  BFR 400  Via AVF BP is  98/60.  Reduce UF goal to 1 L initially later change to no UF because of hypotension and tachycardia. K high even after dialysis, 1 K bath today. He is npo for IR perc cholecystostomy.  D/w primary team.  Rosita Fire 08/19/2018

## 2018-08-19 NOTE — Progress Notes (Signed)
Patient is back on the unit. Orders are placed to be transferred patient to stepdown. Waiting for the bed to be available. Vitals 110/64 , 98.9, 20, 116. Patient is very sleepy but wakes easily when name is called.

## 2018-08-19 NOTE — Procedures (Signed)
Interventional Radiology Procedure Note  Procedure: Percutaneous cholecystostomy. Sample sent   Complications: None  Recommendations:  - follow up culture - Do not submerge  - Routine drain care   Signed,  Dulcy Fanny. Earleen Newport, DO

## 2018-08-19 NOTE — Progress Notes (Signed)
  [facility] Gastroenterology Progress Note  Christian Dalton 77 y.o. Jul 07, 1941  CC:  Acute cholecystitis   Subjective: - patient was seen and examined while in the dialysis unit. He remains somewhat confused. Complaining of abdominal pain.    Objective: Vital signs in last 24 hours: Vitals:   08/19/18 1209 08/19/18 1229  BP: (!) 127/50 110/64  Pulse: 85 (!) 116  Resp:  20  Temp:  98.9 F (37.2 C)  SpO2:  96%    Physical Exam:  Gen. Sleepy and somewhat confused. Heart. Tachycardia. No murmur Abdomen. Right upper quadrant epigastric tenderness to palpation, abdomen is distended, bowel sounds present.  Lab Results: Recent Labs    08/18/18 1423 08/19/18 0433  NA 140 137  K 5.9* 5.7*  CL 95* 94*  CO2 24 25  GLUCOSE 186* 269*  BUN 59* 67*  CREATININE 8.36* 9.13*  CALCIUM 9.0 9.5   Recent Labs    08/18/18 1423 08/19/18 0433  AST 206* 91*  ALT 416* 278*  ALKPHOS 332* 330*  BILITOT 5.0* 4.5*  PROT 6.3* 6.4*  ALBUMIN 2.9* 2.7*   Recent Labs    08/18/18 0605 08/19/18 0433  WBC 18.4* 16.4*  HGB 15.9 15.6  HCT 49.0 48.0  MCV 96.3 95.0  PLT 156 148*   No results for input(s): LABPROT, INR in the last 72 hours.    Assessment/Plan: - Acute emphysematous cholecystitis with MRI showing 1.7 cm gallstone in the gallbladder neck. No evidence of biliary ductal dilatation.  - Cystic lesion in the pancreatic body. 1.1 cm. Repeat MRI recommended in 2 years. - Atrophic kidneys with numerous incompletely characterized renal cysts, several of which are hemorrhagic/proteinaceous. Suggest attention on follow-up MRI abdomen in 6 months -End-stage renal disease.  On hemodialysis. -Leukocytosis . patient is afebrile.  Recommendations ---------------------------- - further management per surgical team. Plan for cholecystectomy today. - We'll need to see him back in office in a few months to arrange for outpatient MRI as recommended. - GI will sign off. Call us back if  needed  Otis Brace MD, [place] 08/19/2018, 12:37 PM  Contact #  [phone]

## 2018-08-19 NOTE — Progress Notes (Addendum)
[facility] PROGRESS NOTE    Progress Note  Christian Dalton  VPX:[id number] DOB: 24-Sep-1941 DOA: 08/17/2018 PCP: Emmaline Kluver, MD     Brief Narrative:   Christian Dalton is an 77 y.o. male past medical history significant of end-stage renal disease on hemodialysis, diabetes and hypertension resents with epigastric pain, an ultrasound showed acute cholecystitis. General surgery was consulted.  Assessment/Plan:   Sepsis due to acute cholecystitis: We will continue IV Zosyn.  Currently n.p.o. Surgery recommended percutaneous drain to be placed by IR to be done today on 08/19/2018. He has remained afebrile, leukocytosis slowly improved. During dialysis is pressure started trending down and mildly tachycardic so he stopped taking fluid off. He is able to carry on a conversation but goes back to sleep. Transferred to stepdown unit, to continue to monitor closely.  Acute encephalopathy: Likely multifactorial in the setting of narcotic use, end-stage renal disease and sepsis He is able to carry on a conversation but goes back to sleep, will hold on on narcotics.  ESRD (end stage renal disease) on dialysis [facility]): On HD.  DM2 (diabetes mellitus, type 2) ([place]): Now n.p.o. on sliding scale insulin with low-dose long-acting insulin Blood glucose ranging 200-300  Obesity, Class III, BMI 40-49.9 (morbid obesity) (HCC)  New Hyperkalemia: Improved with HD on dialysis today again   DVT prophylaxis: lovenox Family Communication:none Disposition Plan/Barrier to D/C: Unable to determine, transferred to step down Code Status:     Code Status Orders  (From admission, onward)         Start     Ordered   08/17/18 1855  Full code  Continuous     08/17/18 1856        Code Status History    This patient has a current code status but no historical code status.        IV Access:    Peripheral IV   Procedures and diagnostic studies:   Mr Abdomen Mrcp Wo  Contrast  Result Date: 08/18/2018 CLINICAL DATA:  Elevated liver function tests. Cholelithiasis. Findings suggestive of acute cholecystitis on recent CT and sonography studies. End-stage renal disease on hemodialysis. Abdominal pain. EXAM: MRI ABDOMEN WITHOUT CONTRAST  (INCLUDING MRCP) TECHNIQUE: Multiplanar multisequence MR imaging of the abdomen was performed. Heavily T2-weighted images of the biliary and pancreatic ducts were obtained, and three-dimensional MRCP images were rendered by post processing. COMPARISON:  08/17/2018 abdominal sonogram and CT abdomen/pelvis. FINDINGS: Lower chest: Hypoventilatory changes at the dependent lung bases. Hepatobiliary: Normal liver size and configuration. No hepatic steatosis. No liver mass. Mildly distended gallbladder. There is a 1.7 cm gallstone in the gallbladder neck. There is layering sludge in the gallbladder. There is new gas in the nondependent fundal gallbladder (series 3/image 25). Moderate diffuse gallbladder wall thickening with trace pericholecystic fluid. No biliary ductal dilatation. Common bile duct diameter 2 mm. No choledocholithiasis. Pancreas: No pancreatic duct dilation. No pancreas divisum. There are several lobulated unilocular small cystic pancreatic lesions scattered throughout the pancreas, largest 1.1 cm in the pancreatic body (series 10/image 60), without appreciable wall thickening or internal complexity. Spleen: Normal size. No mass. Adrenals/Urinary Tract: Normal right adrenal. Left adrenal 1.4 cm nodule with significant loss of signal intensity on out of phase chemical shift imaging, diagnostic of an adenoma. No hydronephrosis. Symmetric renal atrophy. Numerous bilateral renal cysts, largest 2.8 cm in the posterior lower right kidney, several which demonstrate hemorrhagic/proteinaceous signal intensity on T1 imaging, incompletely characterized on this noncontrast scan. Stomach/Bowel: Normal non-distended stomach. Visualized small and  large  bowel is normal caliber, with no bowel wall thickening. Vascular/Lymphatic: Nonaneurysmal abdominal aorta. No pathologically enlarged lymph nodes in the abdomen. Other: No abdominal ascites or focal fluid collection. Musculoskeletal: No aggressive appearing focal osseous lesions. IMPRESSION: 1. Cholelithiasis with 1.7 cm gallstone in the gallbladder neck. Moderate diffuse gallbladder wall thickening with trace pericholecystic fluid. New gas in nondependent fundal gallbladder. Findings are compatible with acute emphysematous cholecystitis. 2. No biliary ductal dilatation. CBD diameter 2 mm. No choledocholithiasis. 3. 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 Follow-up MRI abdomen recommended in 2 years. This recommendation follows ACR consensus guidelines: Management of Incidental Pancreatic Cysts: A White Paper of the ACR Incidental Findings Committee. J Am Coll Radiol 3546;56:812-751. 4. Atrophic kidneys with numerous incompletely characterized renal cysts, several of which are hemorrhagic/proteinaceous. Suggest attention on follow-up MRI abdomen in 6 months. 5. Left adrenal adenoma. Electronically Signed   By: Ilona Sorrel M.D.   On: 08/18/2018 23:59   Mr 3d Recon At Scanner  Result Date: 08/18/2018 CLINICAL DATA:  Elevated liver function tests. Cholelithiasis. Findings suggestive of acute cholecystitis on recent CT and sonography studies. End-stage renal disease on hemodialysis. Abdominal pain. EXAM: MRI ABDOMEN WITHOUT CONTRAST  (INCLUDING MRCP) TECHNIQUE: Multiplanar multisequence MR imaging of the abdomen was performed. Heavily T2-weighted images of the biliary and pancreatic ducts were obtained, and three-dimensional MRCP images were rendered by post processing. COMPARISON:  08/17/2018 abdominal sonogram and CT abdomen/pelvis. FINDINGS: Lower chest: Hypoventilatory changes at the dependent lung bases. Hepatobiliary: Normal liver size and configuration. No hepatic steatosis.  No liver mass. Mildly distended gallbladder. There is a 1.7 cm gallstone in the gallbladder neck. There is layering sludge in the gallbladder. There is new gas in the nondependent fundal gallbladder (series 3/image 25). Moderate diffuse gallbladder wall thickening with trace pericholecystic fluid. No biliary ductal dilatation. Common bile duct diameter 2 mm. No choledocholithiasis. Pancreas: No pancreatic duct dilation. No pancreas divisum. There are several lobulated unilocular small cystic pancreatic lesions scattered throughout the pancreas, largest 1.1 cm in the pancreatic body (series 10/image 60), without appreciable wall thickening or internal complexity. Spleen: Normal size. No mass. Adrenals/Urinary Tract: Normal right adrenal. Left adrenal 1.4 cm nodule with significant loss of signal intensity on out of phase chemical shift imaging, diagnostic of an adenoma. No hydronephrosis. Symmetric renal atrophy. Numerous bilateral renal cysts, largest 2.8 cm in the posterior lower right kidney, several which demonstrate hemorrhagic/proteinaceous signal intensity on T1 imaging, incompletely characterized on this noncontrast scan. Stomach/Bowel: Normal non-distended stomach. Visualized small and large bowel is normal caliber, with no bowel wall thickening. Vascular/Lymphatic: Nonaneurysmal abdominal aorta. No pathologically enlarged lymph nodes in the abdomen. Other: No abdominal ascites or focal fluid collection. Musculoskeletal: No aggressive appearing focal osseous lesions. IMPRESSION: 1. Cholelithiasis with 1.7 cm gallstone in the gallbladder neck. Moderate diffuse gallbladder wall thickening with trace pericholecystic fluid. New gas in nondependent fundal gallbladder. Findings are compatible with acute emphysematous cholecystitis. 2. No biliary ductal dilatation. CBD diameter 2 mm. No choledocholithiasis. 3. 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 Follow-up MRI abdomen recommended  in 2 years. This recommendation follows ACR consensus guidelines: Management of Incidental Pancreatic Cysts: A White Paper of the ACR Incidental Findings Committee. J Am Coll Radiol 7001;74:944-967. 4. Atrophic kidneys with numerous incompletely characterized renal cysts, several of which are hemorrhagic/proteinaceous. Suggest attention on follow-up MRI abdomen in 6 months. 5. Left adrenal adenoma. Electronically Signed   By: Ilona Sorrel M.D.   On: 08/18/2018 23:59  Medical Consultants:    None.  Anti-Infectives:   Zosyn  Subjective:    Antuane [place] he relate his pain is controlled and feels about the same as he did yesterday.  Objective:    Vitals:   08/19/18 0730 08/19/18 0800 08/19/18 0830 08/19/18 0900  BP: (!) 99/50 (!) 98/55 (!) 97/52 (!) 99/48  Pulse: 93 (!) 115 (!) 118 (!) 118  Resp:      Temp:      TempSrc:      SpO2:      Weight:        Intake/Output Summary (Last 24 hours) at 08/19/2018 0923 Last data filed at 08/19/2018 0528 Gross per 24 hour  Intake 98.2 ml  Output 1000 ml  Net -901.8 ml   Filed Weights   08/18/18 1315 08/18/18 2027 08/19/18 0709  Weight: 123 kg 123 kg 121.5 kg    Exam: General exam: In no acute distress. Respiratory system: Good air movement and clear to auscultation. Cardiovascular system: S1 & S2 heard, RRR. No JVD.Marland Kitchen  Gastrointestinal system: Abdomen is nondistended, soft and nontender.  Central nervous system: Alert and oriented. No focal neurological deficits. Extremities: No pedal edema. Skin: No rashes, lesions or ulcers Psychiatry: Judgement and insight appear normal. Mood & affect appropriate.    Data Reviewed:    Labs: Basic Metabolic Panel: Recent Labs  Lab 08/18/18 0605 08/18/18 1423 08/19/18 0433  NA 137 140 137  K 7.3* 5.9* 5.7*  CL 94* 95* 94*  CO2 22 24 25   GLUCOSE 382* 186* 269*  BUN 103* 59* 67*  CREATININE 11.80* 8.36* 9.13*  CALCIUM 8.8* 9.0 9.5   GFR CrCl cannot be calculated (Unknown ideal  weight.). Liver Function Tests: Recent Labs  Lab 08/18/18 0605 08/18/18 1423 08/19/18 0433  AST 303* 206* 91*  ALT 499* 416* 278*  ALKPHOS 333* 332* 330*  BILITOT 4.4* 5.0* 4.5*  PROT 6.4* 6.3* 6.4*  ALBUMIN 3.0* 2.9* 2.7*   Recent Labs  Lab 08/19/18 0433  LIPASE 30   No results for input(s): AMMONIA in the last 168 hours. Coagulation profile No results for input(s): INR, PROTIME in the last 168 hours.  CBC: Recent Labs  Lab 08/18/18 0605 08/19/18 0433  WBC 18.4* 16.4*  HGB 15.9 15.6  HCT 49.0 48.0  MCV 96.3 95.0  PLT 156 148*   Cardiac Enzymes: No results for input(s): CKTOTAL, CKMB, CKMBINDEX, TROPONINI in the last 168 hours. BNP (last 3 results) No results for input(s): PROBNP in the last 8760 hours. CBG: Recent Labs  Lab 08/18/18 0036 08/18/18 0341 08/18/18 0818 08/18/18 1750 08/18/18 2028  GLUCAP 396* 373* 374* 151* 197*   D-Dimer: No results for input(s): DDIMER in the last 72 hours. Hgb A1c: Recent Labs    08/17/18 1918  HGBA1C 9.7*   Lipid Profile: No results for input(s): CHOL, HDL, LDLCALC, TRIG, CHOLHDL, LDLDIRECT in the last 72 hours. Thyroid function studies: No results for input(s): TSH, T4TOTAL, T3FREE, THYROIDAB in the last 72 hours.  Invalid input(s): FREET3 Anemia work up: No results for input(s): VITAMINB12, FOLATE, FERRITIN, TIBC, IRON, RETICCTPCT in the last 72 hours. Sepsis Labs: Recent Labs  Lab 08/18/18 0605 08/19/18 0433  WBC 18.4* 16.4*   Microbiology Recent Results (from the past 240 hour(s))  Surgical pcr screen     Status: None   Collection Time: 08/17/18  8:59 PM  Result Value Ref Range Status   MRSA, PCR NEGATIVE NEGATIVE Final   Staphylococcus aureus NEGATIVE NEGATIVE Final  Comment: (NOTE) The Xpert SA Assay (FDA approved for NASAL specimens in patients 47 years of age and older), is one component of a comprehensive surveillance program. It is not intended to diagnose infection nor to guide or monitor  treatment. Performed at [facility], [place] [address] [place], [place] [address]      Medications:   . calcitRIOL  1 mcg Oral Q T,Th,Sa-HD  . Chlorhexidine Gluconate Cloth  6 each Topical Q0600  . Influenza vac split quadrivalent PF  0.5 mL Intramuscular Tomorrow-1000  . insulin aspart  0-9 Units Subcutaneous TID WC  . insulin detemir  5 Units Subcutaneous BID   Continuous Infusions: . sodium chloride    . sodium chloride    . piperacillin-tazobactam (ZOSYN)  IV 3.375 g (08/19/18 0530)      LOS: 2 days   Charlynne Cousins  [facility] Pager [phone]  *Please refer to [email or web address], password [email or web address] to get updated schedule on who will round on this patient, as hospitalists switch teams weekly. If 7PM-7AM, please contact night-coverage at [email or web address], password [email or web address] for any overnight needs.  08/19/2018, 9:23 AM

## 2018-08-19 NOTE — Progress Notes (Addendum)
Patient ID: Christian Dalton, male   DOB: [birth year], 77 y.o.   MRN: [id number]       Subjective: Patient still sleepy.  Slightly more awake than yesterday and seems to understand what's going on.  Still with a lot of abdominal pain.  C/o of being very sweaty.  Says he's having chest pain but then points to his epigastrium.  Objective: Vital signs in last 24 hours: Temp:  [98.3 F (36.8 C)-100.1 F (37.8 C)] 98.4 F (36.9 C) (09/26 0709) Pulse Rate:  [88-118] 118 (09/26 0900) Resp:  [18-20] 18 (09/26 0528) BP: (85-138)/(40-72) 99/48 (09/26 0900) SpO2:  [93 %-98 %] 93 % (09/26 0528) Weight:  [121.5 kg-123 kg] 121.5 kg (09/26 0709) Last BM Date: 08/17/18  Intake/Output from previous day: 09/25 0701 - 09/26 0700 In: 98.2 [IV Piggyback:98.2] Out: 1000  Intake/Output this shift: No intake/output data recorded.  PE: Gen: still lethargic, profusely diaphoretic, male who appears ill Heart: tachycardic, 120s Lungs: CTAB, but now on 2L sating around 94-95% Abd: soft, obese, very tender on right side of abdomen with voluntary guarding, hypoactive BS.  Lab Results:  Recent Labs    08/18/18 0605 08/19/18 0433  WBC 18.4* 16.4*  HGB 15.9 15.6  HCT 49.0 48.0  PLT 156 148*   BMET Recent Labs    08/18/18 1423 08/19/18 0433  NA 140 137  K 5.9* 5.7*  CL 95* 94*  CO2 24 25  GLUCOSE 186* 269*  BUN 59* 67*  CREATININE 8.36* 9.13*  CALCIUM 9.0 9.5   PT/INR No results for input(s): LABPROT, INR in the last 72 hours. CMP     Component Value Date/Time   NA 137 08/19/2018 0433   K 5.7 (H) 08/19/2018 0433   CL 94 (L) 08/19/2018 0433   CO2 25 08/19/2018 0433   GLUCOSE 269 (H) 08/19/2018 0433   BUN 67 (H) 08/19/2018 0433   CREATININE 9.13 (H) 08/19/2018 0433   CALCIUM 9.5 08/19/2018 0433   PROT 6.4 (L) 08/19/2018 0433   ALBUMIN 2.7 (L) 08/19/2018 0433   AST 91 (H) 08/19/2018 0433   ALT 278 (H) 08/19/2018 0433   ALKPHOS 330 (H) 08/19/2018 0433   BILITOT 4.5 (H) 08/19/2018 0433     GFRNONAA 5 (L) 08/19/2018 0433   GFRAA 6 (L) 08/19/2018 0433   Lipase     Component Value Date/Time   LIPASE 30 08/19/2018 0433       Studies/Results: Mr Abdomen Mrcp Wo Contrast  Result Date: 08/18/2018 CLINICAL DATA:  Elevated liver function tests. Cholelithiasis. Findings suggestive of acute cholecystitis on recent CT and sonography studies. End-stage renal disease on hemodialysis. Abdominal pain. EXAM: MRI ABDOMEN WITHOUT CONTRAST  (INCLUDING MRCP) TECHNIQUE: Multiplanar multisequence MR imaging of the abdomen was performed. Heavily T2-weighted images of the biliary and pancreatic ducts were obtained, and three-dimensional MRCP images were rendered by post processing. COMPARISON:  08/17/2018 abdominal sonogram and CT abdomen/pelvis. FINDINGS: Lower chest: Hypoventilatory changes at the dependent lung bases. Hepatobiliary: Normal liver size and configuration. No hepatic steatosis. No liver mass. Mildly distended gallbladder. There is a 1.7 cm gallstone in the gallbladder neck. There is layering sludge in the gallbladder. There is new gas in the nondependent fundal gallbladder (series 3/image 25). Moderate diffuse gallbladder wall thickening with trace pericholecystic fluid. No biliary ductal dilatation. Common bile duct diameter 2 mm. No choledocholithiasis. Pancreas: No pancreatic duct dilation. No pancreas divisum. There are several lobulated unilocular small cystic pancreatic lesions scattered throughout the pancreas, largest 1.1 cm in the  pancreatic body (series 10/image 60), without appreciable wall thickening or internal complexity. Spleen: Normal size. No mass. Adrenals/Urinary Tract: Normal right adrenal. Left adrenal 1.4 cm nodule with significant loss of signal intensity on out of phase chemical shift imaging, diagnostic of an adenoma. No hydronephrosis. Symmetric renal atrophy. Numerous bilateral renal cysts, largest 2.8 cm in the posterior lower right kidney, several which  demonstrate hemorrhagic/proteinaceous signal intensity on T1 imaging, incompletely characterized on this noncontrast scan. Stomach/Bowel: Normal non-distended stomach. Visualized small and large bowel is normal caliber, with no bowel wall thickening. Vascular/Lymphatic: Nonaneurysmal abdominal aorta. No pathologically enlarged lymph nodes in the abdomen. Other: No abdominal ascites or focal fluid collection. Musculoskeletal: No aggressive appearing focal osseous lesions. IMPRESSION: 1. Cholelithiasis with 1.7 cm gallstone in the gallbladder neck. Moderate diffuse gallbladder wall thickening with trace pericholecystic fluid. New gas in nondependent fundal gallbladder. Findings are compatible with acute emphysematous cholecystitis. 2. No biliary ductal dilatation. CBD diameter 2 mm. No choledocholithiasis. 3. 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 Follow-up MRI abdomen recommended in 2 years. This recommendation follows ACR consensus guidelines: Management of Incidental Pancreatic Cysts: A White Paper of the ACR Incidental Findings Committee. J Am Coll Radiol 4268;34:196-222. 4. Atrophic kidneys with numerous incompletely characterized renal cysts, several of which are hemorrhagic/proteinaceous. Suggest attention on follow-up MRI abdomen in 6 months. 5. Left adrenal adenoma. Electronically Signed   By: Ilona Sorrel M.D.   On: 08/18/2018 23:59   Mr 3d Recon At Scanner  Result Date: 08/18/2018 CLINICAL DATA:  Elevated liver function tests. Cholelithiasis. Findings suggestive of acute cholecystitis on recent CT and sonography studies. End-stage renal disease on hemodialysis. Abdominal pain. EXAM: MRI ABDOMEN WITHOUT CONTRAST  (INCLUDING MRCP) TECHNIQUE: Multiplanar multisequence MR imaging of the abdomen was performed. Heavily T2-weighted images of the biliary and pancreatic ducts were obtained, and three-dimensional MRCP images were rendered by post processing. COMPARISON:   08/17/2018 abdominal sonogram and CT abdomen/pelvis. FINDINGS: Lower chest: Hypoventilatory changes at the dependent lung bases. Hepatobiliary: Normal liver size and configuration. No hepatic steatosis. No liver mass. Mildly distended gallbladder. There is a 1.7 cm gallstone in the gallbladder neck. There is layering sludge in the gallbladder. There is new gas in the nondependent fundal gallbladder (series 3/image 25). Moderate diffuse gallbladder wall thickening with trace pericholecystic fluid. No biliary ductal dilatation. Common bile duct diameter 2 mm. No choledocholithiasis. Pancreas: No pancreatic duct dilation. No pancreas divisum. There are several lobulated unilocular small cystic pancreatic lesions scattered throughout the pancreas, largest 1.1 cm in the pancreatic body (series 10/image 60), without appreciable wall thickening or internal complexity. Spleen: Normal size. No mass. Adrenals/Urinary Tract: Normal right adrenal. Left adrenal 1.4 cm nodule with significant loss of signal intensity on out of phase chemical shift imaging, diagnostic of an adenoma. No hydronephrosis. Symmetric renal atrophy. Numerous bilateral renal cysts, largest 2.8 cm in the posterior lower right kidney, several which demonstrate hemorrhagic/proteinaceous signal intensity on T1 imaging, incompletely characterized on this noncontrast scan. Stomach/Bowel: Normal non-distended stomach. Visualized small and large bowel is normal caliber, with no bowel wall thickening. Vascular/Lymphatic: Nonaneurysmal abdominal aorta. No pathologically enlarged lymph nodes in the abdomen. Other: No abdominal ascites or focal fluid collection. Musculoskeletal: No aggressive appearing focal osseous lesions. IMPRESSION: 1. Cholelithiasis with 1.7 cm gallstone in the gallbladder neck. Moderate diffuse gallbladder wall thickening with trace pericholecystic fluid. New gas in nondependent fundal gallbladder. Findings are compatible with acute  emphysematous cholecystitis. 2. No biliary ductal dilatation. CBD diameter 2 mm. No choledocholithiasis.  3. 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 Follow-up MRI abdomen recommended in 2 years. This recommendation follows ACR consensus guidelines: Management of Incidental Pancreatic Cysts: A White Paper of the ACR Incidental Findings Committee. J Am Coll Radiol 4431;54:008-676. 4. Atrophic kidneys with numerous incompletely characterized renal cysts, several of which are hemorrhagic/proteinaceous. Suggest attention on follow-up MRI abdomen in 6 months. 5. Left adrenal adenoma. Electronically Signed   By: Ilona Sorrel M.D.   On: 08/18/2018 23:59    Anti-infectives: Anti-infectives (From admission, onward)   Start     Dose/Rate Route Frequency Ordered Stop   08/18/18 1000  piperacillin-tazobactam (ZOSYN) IVPB 4.5 g  Status:  Discontinued     4.5 g 200 mL/hr over 30 Minutes Intravenous Every 12 hours 08/17/18 2106 08/17/18 2133   08/17/18 2200  piperacillin-tazobactam (ZOSYN) IVPB 3.375 g     3.375 g 12.5 mL/hr over 240 Minutes Intravenous Every 12 hours 08/17/18 2134         Assessment/Plan Acute cholecystitis -the patient is likely becoming septic from his gallbladder at this time given his persistent lethargy as well as changes to his vital signs.  Suspect some of his lethargy is multi-factorial, but really thought it would improve more after HD yesterday.  Back on HD today. -D/W IR, will plan for perc chole drain today given worsening clinical status as he is at higher risk for perioperative complications now if we proceed with lap chole.  This drain will help temporize his gallbladder so we can plan for interval lap chole in 6-8 weeks.  This has been discussed with his wife who is agreeable with this plan. -agree with tx to SDU  as he may likely worsen before getting better especially after his perc drain is placed. -cont NPO -MRCP from yesterday negative for  choledocholithiasis.  Increase in LFTs are just indicative to how bad his cholecystitis is.  HTN First degree AV block CAD - patient states he has a cardiologist and has had a cath that did not show significant blockage.  He has no echo in our system. COPD ESRD on HD DM OSA  FEN - NPO VTE - SCDs/heparin with HD ID - zosyn 9/24 -->   LOS: 2 days    Henreitta Cea , [facility] 08/19/2018, 9:46 AM Pager: [phone]

## 2018-08-20 ENCOUNTER — Inpatient Hospital Stay (HOSPITAL_COMMUNITY): Payer: Medicare Other

## 2018-08-20 ENCOUNTER — Encounter (HOSPITAL_COMMUNITY): Payer: Self-pay | Admitting: Interventional Radiology

## 2018-08-20 HISTORY — PX: IR DIALY SHUNT INTRO NEEDLE/INTRACATH INITIAL W/IMG LEFT: IMG6102

## 2018-08-20 LAB — GLUCOSE, CAPILLARY
Glucose-Capillary: 250 mg/dL — ABNORMAL HIGH (ref 70–99)
Glucose-Capillary: 267 mg/dL — ABNORMAL HIGH (ref 70–99)
Glucose-Capillary: 256 mg/dL — ABNORMAL HIGH (ref 70–99)
Glucose-Capillary: 189 mg/dL — ABNORMAL HIGH (ref 70–99)

## 2018-08-20 LAB — RENAL FUNCTION PANEL
Albumin: 2.6 g/dL — ABNORMAL LOW (ref 3.5–5.0)
Anion gap: 19 — ABNORMAL HIGH (ref 5–15)
BUN: 63 mg/dL — ABNORMAL HIGH (ref 8–23)
CO2: 23 mmol/L (ref 22–32)
Calcium: 9.7 mg/dL (ref 8.9–10.3)
Chloride: 95 mmol/L — ABNORMAL LOW (ref 98–111)
Creatinine, Ser: 6.82 mg/dL — ABNORMAL HIGH (ref 0.61–1.24)
GFR calc Af Amer: 8 mL/min — ABNORMAL LOW (ref >60)
GFR calc non Af Amer: 7 mL/min — ABNORMAL LOW (ref >60)
Glucose, Bld: 280 mg/dL — ABNORMAL HIGH (ref 70–99)
Phosphorus: 8.6 mg/dL — ABNORMAL HIGH (ref 2.5–4.6)
Potassium: 5.6 mmol/L — ABNORMAL HIGH (ref 3.5–5.1)
Sodium: 137 mmol/L (ref 135–145)

## 2018-08-20 LAB — CBC
HCT: 48.8 % (ref 39.0–52.0)
Hemoglobin: 16 g/dL (ref 13.0–17.0)
MCH: 31 pg (ref 26.0–34.0)
MCHC: 32.8 g/dL (ref 30.0–36.0)
MCV: 94.6 fL (ref 78.0–100.0)
Platelets: 169 10*3/uL (ref 150–400)
RBC: 5.16 MIL/uL (ref 4.22–5.81)
RDW: 16 % — ABNORMAL HIGH (ref 11.5–15.5)
WBC: 14.6 10*3/uL — ABNORMAL HIGH (ref 4.0–10.5)

## 2018-08-20 LAB — AMMONIA: Ammonia: 26 umol/L (ref 9–35)

## 2018-08-20 MED ORDER — PATIROMER SORBITEX CALCIUM 8.4 G PO PACK
16.8000 g | PACK | Freq: Once | ORAL | Status: AC
  Administered 2018-08-20: 16.8 g via ORAL
  Filled 2018-08-20: qty 2

## 2018-08-20 MED ORDER — MONTELUKAST SODIUM 10 MG PO TABS
10.0000 mg | ORAL_TABLET | Freq: Every evening | ORAL | Status: DC
  Administered 2018-08-20 – 2018-08-24 (×5): 10 mg via ORAL
  Filled 2018-08-20 (×5): qty 1

## 2018-08-20 MED ORDER — LEVOTHYROXINE SODIUM 25 MCG PO TABS
125.0000 ug | ORAL_TABLET | Freq: Every day | ORAL | Status: DC
  Administered 2018-08-20 – 2018-08-24 (×4): 125 ug via ORAL
  Filled 2018-08-20 (×4): qty 1

## 2018-08-20 MED ORDER — CHLORHEXIDINE GLUCONATE CLOTH 2 % EX PADS
6.0000 | MEDICATED_PAD | Freq: Every day | CUTANEOUS | Status: DC
  Administered 2018-08-20 – 2018-08-24 (×5): 6 via TOPICAL

## 2018-08-20 MED ORDER — IOPAMIDOL (ISOVUE-300) INJECTION 61%
INTRAVENOUS | Status: AC
  Administered 2018-08-20: 28 mL
  Filled 2018-08-20: qty 100

## 2018-08-20 MED ORDER — ESCITALOPRAM OXALATE 10 MG PO TABS
10.0000 mg | ORAL_TABLET | Freq: Every evening | ORAL | Status: DC
  Administered 2018-08-20 – 2018-08-24 (×5): 10 mg via ORAL
  Filled 2018-08-20 (×5): qty 1

## 2018-08-20 MED ORDER — INSULIN DETEMIR 100 UNIT/ML ~~LOC~~ SOLN
10.0000 [IU] | Freq: Two times a day (BID) | SUBCUTANEOUS | Status: DC
  Administered 2018-08-20 – 2018-08-24 (×8): 10 [IU] via SUBCUTANEOUS
  Filled 2018-08-20 (×9): qty 0.1

## 2018-08-20 NOTE — Progress Notes (Signed)
[place] TEAM 1 - Stepdown/ICU TEAM  Christian Dalton  ZDG:[id number] DOB: 04-02-1941 DOA: 08/17/2018 PCP: Street, Sharon Mt, MD    Brief Narrative:  77 y.o. male w/ a hx of ESRD on hemodialysis, diabetes, and hypertension who presented with epigastric pain. An US showed acute cholecystitis.  Subjective: Pt reports he is feeling better today, but his abdom pain persists, as does his nausea.  He denies chest pain or signif SOB.    Assessment & Plan:  Sepsis due to acute cholecystitis S/p percutaneous drain per IR 08/19/2018 - Gen Surgery following - MRI noted 1.7cm gallstone in gallbladder neck - no biliary ductal dilation noted - clinically improving s/p perc drain   Acute encephalopathy multifactorial in setting of narcotic use, end-stage renal disease, and sepsis - mental status appears to be slowly improving   1.1cm Cystic lesion in pancreatic body  Will need f/u MRI in outpt setting (formal rec is in 2 years)   ESRD on dialysis Care per Nephrology  DM2  CBG trending upward - diet being advanced - adjust tx and follow   Obesity, Class III, BMI 40-49.9 (morbid obesity)  Hyperkalemia Nephrology is addressing   DVT prophylaxis: SQ heparin  Code Status: FULL CODE Family Communication: no family present at time of exam  Disposition Plan: stable for tele bed   Consultants:  Gen Surgery Nephrology  GI IR  Antimicrobials:  Zosyn 9/24 >  Objective: Blood pressure 125/77, pulse (!) 103, temperature 98.4 F (36.9 C), temperature source Oral, resp. rate (!) 23, height 5\' 10"  (1.778 m), weight 119.5 kg, SpO2 95 %. No intake or output data in the 24 hours ending 08/20/18 1349 Filed Weights   08/18/18 2027 08/19/18 0709 08/19/18 1459  Weight: 123 kg 121.5 kg 119.5 kg    Examination: General: No acute respiratory distress Lungs: Clear to auscultation bilaterally without wheezes or crackles Cardiovascular: Regular rate and rhythm without murmur gallop or rub normal S1  and S2 Abdomen: protuberant, soft, BS+, drain in place in RUQ draining black fluid  Extremities: trace B LE edema   CBC: Recent Labs  Lab 08/18/18 0605 08/19/18 0433 08/20/18 0416  WBC 18.4* 16.4* 14.6*  HGB 15.9 15.6 16.0  HCT 49.0 48.0 48.8  MCV 96.3 95.0 94.6  PLT 156 148* 595   Basic Metabolic Panel: Recent Labs  Lab 08/18/18 1423 08/19/18 0433 08/20/18 0419  NA 140 137 137  K 5.9* 5.7* 5.6*  CL 95* 94* 95*  CO2 24 25 23   GLUCOSE 186* 269* 280*  BUN 59* 67* 63*  CREATININE 8.36* 9.13* 6.82*  CALCIUM 9.0 9.5 9.7  PHOS  --   --  8.6*   GFR: Estimated Creatinine Clearance: 12.1 mL/min (A) (by C-G formula based on SCr of 6.82 mg/dL (H)).  Liver Function Tests: Recent Labs  Lab 08/18/18 0605 08/18/18 1423 08/19/18 0433 08/20/18 0419  AST 303* 206* 91*  --   ALT 499* 416* 278*  --   ALKPHOS 333* 332* 330*  --   BILITOT 4.4* 5.0* 4.5*  --   PROT 6.4* 6.3* 6.4*  --   ALBUMIN 3.0* 2.9* 2.7* 2.6*   Recent Labs  Lab 08/19/18 0433  LIPASE 30   Recent Labs  Lab 08/20/18 0419  AMMONIA 26    Coagulation Profile: Recent Labs  Lab 08/19/18 1231  INR 1.22   HbA1C: Hgb A1c MFr Bld  Date/Time Value Ref Range Status  08/17/2018 07:18 PM 9.7 (H) 4.8 - 5.6 % Final  Comment:    (NOTE) Pre diabetes:          5.7%-6.4% Diabetes:              >6.4% Glycemic control for   <7.0% adults with diabetes     CBG: Recent Labs  Lab 08/18/18 2028 08/19/18 1239 08/19/18 2108 08/20/18 0828 08/20/18 1201  GLUCAP 197* 160* 269* 250* 267*    Recent Results (from the past 240 hour(s))  Surgical pcr screen     Status: None   Collection Time: 08/17/18  8:59 PM  Result Value Ref Range Status   MRSA, PCR NEGATIVE NEGATIVE Final   Staphylococcus aureus NEGATIVE NEGATIVE Final    Comment: (NOTE) The Xpert SA Assay (FDA approved for NASAL specimens in patients 61 years of age and older), is one component of a comprehensive surveillance program. It is not  intended to diagnose infection nor to guide or monitor treatment. Performed at [facility], [place] [address], [place], [place] [address]   Aerobic/Anaerobic Culture (surgical/deep wound)     Status: None (Preliminary result)   Collection Time: 08/19/18  4:20 PM  Result Value Ref Range Status   Specimen Description BILE  Final   Special Requests NONE  Final   Gram Stain   Final    WBC PRESENT, PREDOMINANTLY MONONUCLEAR NO ORGANISMS SEEN    Culture   Final    NO GROWTH < 24 HOURS Performed at [facility], [place] [address] [place], [place] [address]    Report Status PENDING  Incomplete     Scheduled Meds: . calcitRIOL  1 mcg Oral Q T,Th,Sa-HD  . Chlorhexidine Gluconate Cloth  6 each Topical Q0600  . feeding supplement (PRO-STAT SUGAR FREE 64)  30 mL Oral BID  . ferric citrate  630 mg Oral TID WC  . heparin injection (subcutaneous)  5,000 Units Subcutaneous Q8H  . insulin aspart  0-9 Units Subcutaneous TID WC  . insulin detemir  5 Units Subcutaneous BID  . multivitamin  1 tablet Oral QHS  . patiromer  16.8 g Oral Once  . sodium chloride flush  5 mL Intracatheter Q8H   Continuous Infusions: . sodium chloride    . sodium chloride    . piperacillin-tazobactam (ZOSYN)  IV 3.375 g (08/20/18 8295)     LOS: 3 days   Cherene Altes, MD [facility] Office  [phone] Pager - Text Page per Amion  If 7PM-7AM, please contact night-coverage per Amion 08/20/2018, 1:49 PM

## 2018-08-20 NOTE — Progress Notes (Signed)
[facility] Progress Note   Dialysis Orders: [place] TTS 4h 500/800 EDW 118kg 2K/2.25Ca L AVF Heparin bolus 7000 U Calcitriol 1.0 mcg TIW Auyrxia 3 tabs qactid  Assessment/Plan: 1 acute cholecystitis - s/p IR cholecystostomy/drain - on Zosyn, LFTs improving 2. ESRD - TTS -K 5.7 - ideally would like to postpone HD until after fistulagram 3. Anemia - hgb 15.6 no ESA 4. Secondary hyperparathyroidism - curent meds 5. BP/volume - net UF 1 L Wed, 741 ml Thurs - volume status is fine 6. Nutrition - renal carb mod/renavite 7. Hyperkalemia - K still persistent without much change in BUN after 4 hour dialysis on a 1 K bath - was not at full BFR of 500 but should have gotten better clearances - needs fistulagram - will order -hopefully can work in today - 8. DM - per primary  Myriam Jacobson, PA-C [facility] Beeper [phone] 08/20/2018,9:55 AM  LOS: 3 days   Subjective:   Very groggy - c/o of belly pain   Objective Vitals:   08/20/18 0005 08/20/18 0018 08/20/18 0411 08/20/18 0829  BP:  124/85 125/77   Pulse: (!) 113 (!) 112 (!) 103   Resp: 18 (!) 21 (!) 23   Temp:  98.4 F (36.9 C) 98.3 F (36.8 C) 98.3 F (36.8 C)  TempSrc:  Oral Oral Oral  SpO2: 95% 93% 95%   Weight:      Height:       Physical Exam General: almost supine in bed, O2 out of place sats 93% - repositioned O2 - sats up to 95% Heart: tachy reg Lungs: clear anteriorly Abdomen: very distended, obese tender + BS Extremities:no LE edema SCDs in place Dialysis Access:  Left AVF + bruit/thrill   Additional Objective Labs: Basic Metabolic Panel: Recent Labs  Lab 08/18/18 1423 08/19/18 0433 08/20/18 0419  NA 140 137 137  K 5.9* 5.7* 5.6*  CL 95* 94* 95*  CO2 24 25 23   GLUCOSE 186* 269* 280*  BUN 59* 67* 63*  CREATININE 8.36* 9.13* 6.82*  CALCIUM 9.0 9.5 9.7  PHOS  --   --  8.6*   Liver Function Tests: Recent Labs  Lab 08/18/18 0605 08/18/18 1423 08/19/18 0433  08/20/18 0419  AST 303* 206* 91*  --   ALT 499* 416* 278*  --   ALKPHOS 333* 332* 330*  --   BILITOT 4.4* 5.0* 4.5*  --   PROT 6.4* 6.3* 6.4*  --   ALBUMIN 3.0* 2.9* 2.7* 2.6*   Recent Labs  Lab 08/19/18 0433  LIPASE 30   CBC: Recent Labs  Lab 08/18/18 0605 08/19/18 0433 08/20/18 0416  WBC 18.4* 16.4* 14.6*  HGB 15.9 15.6 16.0  HCT 49.0 48.0 48.8  MCV 96.3 95.0 94.6  PLT 156 148* 169   Blood Culture    Component Value Date/Time   SDES BILE 08/19/2018 1620   SPECREQUEST NONE 08/19/2018 1620   CULT  08/19/2018 1620    NO GROWTH < 24 HOURS Performed at [facility], [address] [place], [place] [address]    REPTSTATUS PENDING 08/19/2018 1620    Cardiac Enzymes: No results for input(s): CKTOTAL, CKMB, CKMBINDEX, TROPONINI in the last 168 hours. CBG: Recent Labs  Lab 08/18/18 1750 08/18/18 2028 08/19/18 1239 08/19/18 2108 08/20/18 0828  GLUCAP 151* 197* 160* 269* 250*   Iron Studies: No results for input(s): IRON, TIBC, TRANSFERRIN, FERRITIN in the last 72 hours. Lab Results  Component Value Date  INR 1.22 08/19/2018   Studies/Results: Mr Abdomen Mrcp Wo Contrast  Result Date: 08/18/2018 CLINICAL DATA:  Elevated liver function tests. Cholelithiasis. Findings suggestive of acute cholecystitis on recent CT and sonography studies. End-stage renal disease on hemodialysis. Abdominal pain. EXAM: MRI ABDOMEN WITHOUT CONTRAST  (INCLUDING MRCP) TECHNIQUE: Multiplanar multisequence MR imaging of the abdomen was performed. Heavily T2-weighted images of the biliary and pancreatic ducts were obtained, and three-dimensional MRCP images were rendered by post processing. COMPARISON:  08/17/2018 abdominal sonogram and CT abdomen/pelvis. FINDINGS: Lower chest: Hypoventilatory changes at the dependent lung bases. Hepatobiliary: Normal liver size and configuration. No hepatic steatosis. No liver mass. Mildly distended gallbladder. There is a 1.7 cm gallstone in the  gallbladder neck. There is layering sludge in the gallbladder. There is new gas in the nondependent fundal gallbladder (series 3/image 25). Moderate diffuse gallbladder wall thickening with trace pericholecystic fluid. No biliary ductal dilatation. Common bile duct diameter 2 mm. No choledocholithiasis. Pancreas: No pancreatic duct dilation. No pancreas divisum. There are several lobulated unilocular small cystic pancreatic lesions scattered throughout the pancreas, largest 1.1 cm in the pancreatic body (series 10/image 60), without appreciable wall thickening or internal complexity. Spleen: Normal size. No mass. Adrenals/Urinary Tract: Normal right adrenal. Left adrenal 1.4 cm nodule with significant loss of signal intensity on out of phase chemical shift imaging, diagnostic of an adenoma. No hydronephrosis. Symmetric renal atrophy. Numerous bilateral renal cysts, largest 2.8 cm in the posterior lower right kidney, several which demonstrate hemorrhagic/proteinaceous signal intensity on T1 imaging, incompletely characterized on this noncontrast scan. Stomach/Bowel: Normal non-distended stomach. Visualized small and large bowel is normal caliber, with no bowel wall thickening. Vascular/Lymphatic: Nonaneurysmal abdominal aorta. No pathologically enlarged lymph nodes in the abdomen. Other: No abdominal ascites or focal fluid collection. Musculoskeletal: No aggressive appearing focal osseous lesions. IMPRESSION: 1. Cholelithiasis with 1.7 cm gallstone in the gallbladder neck. Moderate diffuse gallbladder wall thickening with trace pericholecystic fluid. New gas in nondependent fundal gallbladder. Findings are compatible with acute emphysematous cholecystitis. 2. No biliary ductal dilatation. CBD diameter 2 mm. No choledocholithiasis. 3. 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 Follow-up MRI abdomen recommended in 2 years. This recommendation follows ACR consensus guidelines: Management of  Incidental Pancreatic Cysts: A White Paper of the ACR Incidental Findings Committee. J Am Coll Radiol 2122;48:250-037. 4. Atrophic kidneys with numerous incompletely characterized renal cysts, several of which are hemorrhagic/proteinaceous. Suggest attention on follow-up MRI abdomen in 6 months. 5. Left adrenal adenoma. Electronically Signed   By: Ilona Sorrel M.D.   On: 08/18/2018 23:59   Mr 3d Recon At Scanner  Result Date: 08/18/2018 CLINICAL DATA:  Elevated liver function tests. Cholelithiasis. Findings suggestive of acute cholecystitis on recent CT and sonography studies. End-stage renal disease on hemodialysis. Abdominal pain. EXAM: MRI ABDOMEN WITHOUT CONTRAST  (INCLUDING MRCP) TECHNIQUE: Multiplanar multisequence MR imaging of the abdomen was performed. Heavily T2-weighted images of the biliary and pancreatic ducts were obtained, and three-dimensional MRCP images were rendered by post processing. COMPARISON:  08/17/2018 abdominal sonogram and CT abdomen/pelvis. FINDINGS: Lower chest: Hypoventilatory changes at the dependent lung bases. Hepatobiliary: Normal liver size and configuration. No hepatic steatosis. No liver mass. Mildly distended gallbladder. There is a 1.7 cm gallstone in the gallbladder neck. There is layering sludge in the gallbladder. There is new gas in the nondependent fundal gallbladder (series 3/image 25). Moderate diffuse gallbladder wall thickening with trace pericholecystic fluid. No biliary ductal dilatation. Common bile duct diameter 2 mm. No choledocholithiasis. Pancreas: No pancreatic duct  dilation. No pancreas divisum. There are several lobulated unilocular small cystic pancreatic lesions scattered throughout the pancreas, largest 1.1 cm in the pancreatic body (series 10/image 60), without appreciable wall thickening or internal complexity. Spleen: Normal size. No mass. Adrenals/Urinary Tract: Normal right adrenal. Left adrenal 1.4 cm nodule with significant loss of signal  intensity on out of phase chemical shift imaging, diagnostic of an adenoma. No hydronephrosis. Symmetric renal atrophy. Numerous bilateral renal cysts, largest 2.8 cm in the posterior lower right kidney, several which demonstrate hemorrhagic/proteinaceous signal intensity on T1 imaging, incompletely characterized on this noncontrast scan. Stomach/Bowel: Normal non-distended stomach. Visualized small and large bowel is normal caliber, with no bowel wall thickening. Vascular/Lymphatic: Nonaneurysmal abdominal aorta. No pathologically enlarged lymph nodes in the abdomen. Other: No abdominal ascites or focal fluid collection. Musculoskeletal: No aggressive appearing focal osseous lesions. IMPRESSION: 1. Cholelithiasis with 1.7 cm gallstone in the gallbladder neck. Moderate diffuse gallbladder wall thickening with trace pericholecystic fluid. New gas in nondependent fundal gallbladder. Findings are compatible with acute emphysematous cholecystitis. 2. No biliary ductal dilatation. CBD diameter 2 mm. No choledocholithiasis. 3. 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 Follow-up MRI abdomen recommended in 2 years. This recommendation follows ACR consensus guidelines: Management of Incidental Pancreatic Cysts: A White Paper of the ACR Incidental Findings Committee. J Am Coll Radiol 2993;71:696-789. 4. Atrophic kidneys with numerous incompletely characterized renal cysts, several of which are hemorrhagic/proteinaceous. Suggest attention on follow-up MRI abdomen in 6 months. 5. Left adrenal adenoma. Electronically Signed   By: Ilona Sorrel M.D.   On: 08/18/2018 23:59   Ir Perc Cholecystostomy  Result Date: 08/19/2018 INDICATION: 77 year old male with acute cholecystitis EXAM: IS IMAGE GUIDED PERCUTANEOUS CHOLECYSTOSTOMY MEDICATIONS: In house antibiotics ANESTHESIA/SEDATION: Moderate (conscious) sedation was employed during this procedure. A total of Versed 1.0 mg and Fentanyl 50 mcg was  administered intravenously. Moderate Sedation Time: 10 minutes. The patient's level of consciousness and vital signs were monitored continuously by radiology nursing throughout the procedure under my direct supervision. FLUOROSCOPY TIME:  Fluoroscopy Time: 0 minutes 18 seconds (25 mGy). COMPLICATIONS: None PROCEDURE: Informed written consent was obtained from the patient and the patient's family after a thorough discussion of the procedural risks, benefits and alternatives. All questions were addressed. Maximal Sterile Barrier Technique was utilized including caps, mask, sterile gowns, sterile gloves, sterile drape, hand hygiene and skin antiseptic. A timeout was performed prior to the initiation of the procedure. Ultrasound survey of the right upper quadrant was performed for planning purposes. Once the patient is prepped and draped in the usual sterile fashion, the skin and subcutaneous tissues overlying the gallbladder were generously infiltrated 1% lidocaine for local anesthesia. A coaxial needle was advanced under ultrasound guidance through the skin subcutaneous tissues and a small segment of liver into the gallbladder lumen. With removal of the stylet, spontaneous dark bile drainage occurred. Using modified Seldinger technique, a 10 French drain was placed into the gallbladder fossa, with aspiration of the sample for the lab. Contrast injection confirmed position of the tube within the gallbladder lumen. Drainage catheter was attached to gravity drain with a suture retention placed. Patient tolerated the procedure well and remained hemodynamically stable throughout. No complications were encountered and no significant blood loss encountered. IMPRESSION: Status post image guided percutaneous cholecystostomy. Signed, Dulcy Fanny. Dellia Nims, RPVI [facility] Radiology Electronically Signed   By: Corrie Mckusick D.O.   On: 08/19/2018 16:23   Medications: . sodium  chloride    .  sodium chloride    . piperacillin-tazobactam (ZOSYN)  IV 3.375 g (08/20/18 5488)   . calcitRIOL  1 mcg Oral Q T,Th,Sa-HD  . Chlorhexidine Gluconate Cloth  6 each Topical Q0600  . feeding supplement (PRO-STAT SUGAR FREE 64)  30 mL Oral BID  . ferric citrate  630 mg Oral TID WC  . heparin injection (subcutaneous)  5,000 Units Subcutaneous Q8H  . insulin aspart  0-9 Units Subcutaneous TID WC  . insulin detemir  5 Units Subcutaneous BID  . multivitamin  1 tablet Oral QHS  . sodium chloride flush  5 mL Intracatheter Q8H

## 2018-08-20 NOTE — Progress Notes (Signed)
Rehab Admissions Coordinator Note:  Patient was screened by Cleatrice Burke for appropriateness for an Inpatient Acute Rehab Consult per OT recommendation.   At this time, we are recommending Inpatient Rehab consult. Please place order for consult if pt/family would like to be considered for an inpt rehab admission rather than SNF. Noted pt has had recent SNF stay with his wife so this will need to be clarified.  Danne Baxter, RN, MSN Rehab Admissions Coordinator [phone] 08/20/2018 1:10 PM

## 2018-08-20 NOTE — Evaluation (Signed)
Occupational Therapy Evaluation Patient Details Name: Christian Dalton MRN: [id number] DOB: 1940/12/18 Today's Date: 08/20/2018    History of Present Illness Mr. Hevener is a 77yo M admitted with epigastric pain, dx of acute cholecystitis. PMH significant for CAD, ESRD on HD, and DM.    Clinical Impression   Pt admitted with above dx. Pt now presenting with decreased functional activity tolerance and requiring assistance with functional mobility and ADLs. Pt would benefit from CIR to address ADL retraining and safety and independence with ADL transfers to reduce caregiver burden on wife and promote maximal return to PLOF. Pt's wife reports he was experiencing a functional decline at home that had progressed to her completing LB ADLs with total A. Pt reported willingness to participate in CIR program and to complete these ADLs more independently. Pt's son reported several falls in the last year, making pt a high fall and re-admission risk without further OT/PT services.     Follow Up Recommendations  CIR    Equipment Recommendations  Other (comment)(defer to next venue)    Recommendations for Other Services Rehab consult;PT consult     Precautions / Restrictions Precautions Precautions: Fall Restrictions Weight Bearing Restrictions: No      Mobility Bed Mobility Overal bed mobility: Needs Assistance Bed Mobility: Sidelying to Sit;Rolling Rolling: Min assist Sidelying to sit: Mod assist       General bed mobility comments: vc for techniques and body mechanics  Transfers Overall transfer level: Needs assistance Equipment used: Rolling walker (2 wheeled) Transfers: Sit to/from [facility] Sit to Stand: Mod assist Stand pivot transfers: Mod assist       General transfer comment: Unsteady on feet during stand pivot, vc for RW management    Balance Overall balance assessment: Needs assistance Sitting-balance support: Feet supported;No upper extremity  supported Sitting balance-Leahy Scale: Fair     Standing balance support: Bilateral upper extremity supported;During functional activity Standing balance-Leahy Scale: Poor                             ADL either performed or assessed with clinical judgement   ADL Overall ADL's : Needs assistance/impaired Eating/Feeding: Supervision/ safety;Set up;Sitting   Grooming: Wash/dry face;Set up;Sitting   Upper Body Bathing: Set up;Supervision/ safety;Sitting   Lower Body Bathing: Maximal assistance;Sit to/from stand   Upper Body Dressing : Minimal assistance;Sitting   Lower Body Dressing: Maximal assistance;Sit to/from stand   Toilet Transfer: Moderate assistance;RW;BSC;Cueing for safety;Cueing for sequencing;Stand-pivot   Toileting- Clothing Manipulation and Hygiene: Maximal assistance;Sit to/from stand               Vision Baseline Vision/History: No visual deficits Patient Visual Report: No change from baseline Vision Assessment?: No apparent visual deficits     Perception     Praxis      Pertinent Vitals/Pain Pain Assessment: Faces Faces Pain Scale: Hurts little more Pain Location: R flank Pain Descriptors / Indicators: Aching Pain Intervention(s): Monitored during session     Hand Dominance Right   Extremity/Trunk Assessment Upper Extremity Assessment Upper Extremity Assessment: Generalized weakness   Lower Extremity Assessment Lower Extremity Assessment: Defer to PT evaluation   Cervical / Trunk Assessment Cervical / Trunk Assessment: Kyphotic   Communication Communication Communication: No difficulties   Cognition Arousal/Alertness: Awake/alert Behavior During Therapy: WFL for tasks assessed/performed Overall Cognitive Status: Within Functional Limits for tasks assessed  General Comments  Pt wife and son present throughout session, reporting pt depending more and more on them during ADLs  at home    Exercises     Shoulder [place] expects to be discharged to:: Private residence Living Arrangements: Spouse/significant other(son lives in apt above) Available Help at Discharge: Family;Available 24 hours/day Type of Home: Apartment Home Access: Level entry     Home Layout: One level     Bathroom Shower/Tub: Occupational psychologist: Handicapped height Bathroom Accessibility: Yes How Accessible: Accessible via walker Home Equipment: [place] - 2 wheels;Shower seat;Bedside commode;Cane - single point          Prior Functioning/Environment Level of Independence: Needs assistance  Gait / Transfers Assistance Needed: used RW and cane around house/to HD ADL's / Homemaking Assistance Needed: wife assisted with dressing, provided (S) during showers            OT Problem List: Decreased strength;Decreased activity tolerance;Impaired balance (sitting and/or standing);Decreased safety awareness;Decreased knowledge of use of DME or AE;Decreased knowledge of precautions;Pain;Obesity;Cardiopulmonary status limiting activity;Decreased coordination      OT Treatment/Interventions: Self-care/ADL training;Balance training;Patient/family education;Therapeutic activities;DME and/or AE instruction;Energy conservation;Therapeutic exercise    OT Goals(Current goals can be found in the care plan section) Acute Rehab OT Goals Patient Stated Goal: "to get stronger" OT Goal Formulation: With patient/family Time For Goal Achievement: 08/27/18 Potential to Achieve Goals: Good  OT Frequency: Min 3X/week   Barriers to D/C:            Co-evaluation              AM-PAC PT "6 Clicks" Daily Activity     Outcome Measure Help from another person eating meals?: None Help from another person taking care of personal grooming?: None Help from another person toileting, which includes using toliet, bedpan, or urinal?: A Lot Help from  another person bathing (including washing, rinsing, drying)?: A Lot Help from another person to put on and taking off regular upper body clothing?: A Little Help from another person to put on and taking off regular lower body clothing?: A Lot 6 Click Score: 17   End of Session Equipment Utilized During Treatment: Gait belt;Rolling walker;Oxygen Nurse Communication: Mobility status  Activity Tolerance: Patient tolerated treatment well Patient left: in bed;with call bell/phone within reach;with family/visitor present  OT Visit Diagnosis: Unsteadiness on feet (R26.81);Muscle weakness (generalized) (M62.81);History of falling (Z91.81)                Time: 4536-4680 OT Time Calculation (min): 67 min Charges:  OT General Charges $OT Visit: 1 Visit OT Evaluation $OT Eval Moderate Complexity: 1 Mod OT Treatments $Self Care/Home Management : 38-52 mins  Curtis Sites OTR/L 08/20/2018, 12:48 PM

## 2018-08-20 NOTE — Progress Notes (Signed)
Patient pulled out PIV x 2 this shift. New PIV inserted

## 2018-08-20 NOTE — Procedures (Signed)
LUE AVF angio Widely patent EBL 0 Comp 0

## 2018-08-20 NOTE — Consult Note (Signed)
            [facility] CM Primary Care Navigator  08/20/2018  Christian Dalton 06-Aug-1941 [id number]   Christian Dalton to see patient in the room to identify possible discharge needs but he was fast asleep. No family present at the bedside.  Patient presented with epigastric pain and CT scan of his chest and abdomen, as well as ultrasound of his abdomen showed changes suggestive of acute cholecystitis.  Anticipated discharge plan is CIR- [facility] per therapy recommendation.     Addendum (08/23/18):   Went back to see patientat the bedsideto identify possible discharge needs.  Patient reports having "increased abdominal pain" thathad ledto this admission/ surgery. (sepsis due to acute cholecystitis, end stage renal disease on dialysis)  Christian Dalton with [facility] ashis primary care provider.   [facility] [place] [facility] to obtainmedications without difficulty.  Patient reportsthat his wife Christian Dalton) is managing his medications at [facility] use of "pill box" system filled every 2 weeks.  Patient's wife hasbeen providingtransportationto hisdoctors' appointments.  Patientstatesthatwife is theprimary caregiver for him at home.  Current discharge plan is home with home health services per therapy.   Patientvoiced understanding to call primary care provider'soffice whenhegets home for a post discharge follow-up appointment within1- 2 weeksor sooner if needs arise.Patient letter (with PCP's contact number) was provided asareminder.  Explained topatientregardingTHN CM services available for health management andresourcesat home,buthedenies needs at this point. Hereports having received education/ information few years back on how to managehishealth needs (mainly DM)and so far, "does not feel the need for any services for now".His wife assists him  with managing his health needs as stated. Patient had adamantly declined [facility]-CM services offered which includes EMMIcalls to follow-up with his recovery at home. Patientwas encouragedto seekreferral to [facility] care management from primary care providerifhe changes his mind and if deemed necessary and appropriate foranyservicesin thenear future.  Patient was followed by Inpatient DM coordinator during this admission.  [facility] care management information provided for future needs thathemay have.  Primary care provider's office is listed as providing transition of care (TOC) follow-up.   For additional questions please contact:  Christian Dalton, BSN, RN-BC [facility] PRIMARY CARE Navigator Cell: [phone]

## 2018-08-20 NOTE — Evaluation (Signed)
Physical Therapy Evaluation Patient Details Name: Christian Dalton MRN: [id number] DOB: November 24, 1941 Today's Date: 08/20/2018   History of Present Illness  Christian Dalton is a 77yo M admitted with epigastric pain, dx of acute cholecystitis. PMH significant for CAD, ESRD on HD, and DM.   Clinical Impression   Patient received in bed, pleasant but hesitant to work with therapy due to pain and fatigue this afternoon, willing to perform bed mobility and declined transfers and gait this afternoon. He requires MinA for rolling due to abdominal incision/drain, and ModA for sidelying to sit. Once seated at EOB, he demonstrates good sitting balance and requests to stay sitting at bedside to eat. He refuses hospital socks; bed alarm activated and nurse tech informed regarding patient status/asked to help patient to return to supine when ready to maintain integrity of lines/abdominal drain. He was left sitting at EOB with feet supported, bed alarm activated and all needs otherwise met this afternoon. Due to significant change in mobility and activity tolerance, recommend intense therapies in the CIR setting moving forward.     Follow Up Recommendations CIR    Equipment Recommendations  Other (comment)(defer to next venue )    Recommendations for Other Services       Precautions / Restrictions Precautions Precautions: Fall;Other (comment) Precaution Comments: RUQ drain  Restrictions Weight Bearing Restrictions: No      Mobility  Bed Mobility Overal bed mobility: Needs Assistance Bed Mobility: Sidelying to Sit;Rolling Rolling: Min assist Sidelying to sit: Mod assist       General bed mobility comments: cues for correct techniques and mechanics, ModA to power up to full sitting position   Transfers Overall transfer level: Needs assistance Equipment used: Rolling walker (2 wheeled) Transfers: Sit to/from [facility] Sit to Stand: Mod assist Stand pivot transfers: Mod assist        General transfer comment: patient declined   Ambulation/Gait             General Gait Details: patient declined   Stairs            Wheelchair Mobility    Modified Rankin (Stroke Patients Only)       Balance Overall balance assessment: Needs assistance Sitting-balance support: Feet supported;No upper extremity supported Sitting balance-Leahy Scale: Good     Standing balance support: Bilateral upper extremity supported;During functional activity Standing balance-Leahy Scale: Poor                               Pertinent Vitals/Pain Pain Assessment: Faces Faces Pain Scale: Hurts little more Pain Location: R flank Pain Descriptors / Indicators: Aching Pain Intervention(s): Monitored during session;Limited activity within patient's tolerance;Patient requesting pain meds-RN notified    Home Living Family/patient expects to be discharged to:: Private residence Living Arrangements: Spouse/significant other(son lives in apt above ) Available Help at Discharge: Family;Available 24 hours/day Type of Home: Apartment Home Access: Level entry     Home Layout: One level Home Equipment: [place] - 2 wheels;Shower seat;Bedside commode;Cane - single point      Prior Function Level of Independence: Needs assistance   Gait / Transfers Assistance Needed: used RW and cane around house/to HD  ADL's / Homemaking Assistance Needed: wife assisted with dressing, provided (S) during showers        Hand Dominance   Dominant Hand: Right    Extremity/Trunk Assessment   Upper Extremity Assessment Upper Extremity Assessment: Defer to OT evaluation  Lower Extremity Assessment Lower Extremity Assessment: Generalized weakness    Cervical / Trunk Assessment Cervical / Trunk Assessment: Kyphotic  Communication   Communication: No difficulties  Cognition Arousal/Alertness: Awake/alert Behavior During Therapy: WFL for tasks assessed/performed Overall  Cognitive Status: Within Functional Limits for tasks assessed                                        General Comments General comments (skin integrity, edema, etc.): patient refused to don hospital socks; nurse tech called and informed patient sitting EOB to eat, asked her to keep an eye on him and assist with back to supine due to lines/drain     Exercises     Assessment/Plan    PT Assessment Patient needs continued PT services  PT Problem List Decreased strength;Decreased mobility;Decreased coordination;Decreased activity tolerance;Decreased balance;Pain       PT Treatment Interventions DME instruction;Therapeutic activities;Gait training;Therapeutic exercise;Patient/family education;Stair training;Balance training;Functional mobility training;Neuromuscular re-education    PT Goals (Current goals can be found in the Care Plan section)  Acute Rehab PT Goals Patient Stated Goal: "to get stronger" PT Goal Formulation: With patient Time For Goal Achievement: 09/03/18 Potential to Achieve Goals: Good    Frequency Min 3X/week   Barriers to discharge        Co-evaluation               AM-PAC PT "6 Clicks" Daily Activity  Outcome Measure Difficulty turning over in bed (including adjusting bedclothes, sheets and blankets)?: A Little Difficulty moving from lying on back to sitting on the side of the bed? : A Lot Difficulty sitting down on and standing up from a chair with arms (e.g., wheelchair, bedside commode, etc,.)?: A Lot Help needed moving to and from a bed to chair (including a wheelchair)?: A Lot Help needed walking in hospital room?: A Lot Help needed climbing 3-5 steps with a railing? : A Lot 6 Click Score: 13    End of Session   Activity Tolerance: Patient limited by pain Patient left: in bed;with call bell/phone within reach;with bed alarm set(sitting at side of bed per his request ) Nurse Communication: Other (comment)(nurse tech advised to  please keep eye on patient and help with back to bed due to drain/lines ) PT Visit Diagnosis: Muscle weakness (generalized) (M62.81);Other abnormalities of gait and mobility (R26.89);Difficulty in walking, not elsewhere classified (R26.2)    Time: 5009-3818 PT Time Calculation (min) (ACUTE ONLY): 18 min   Charges:   PT Evaluation $PT Eval Moderate Complexity: 1 Mod          Deniece Ree PT, DPT, CBIS  Supplemental Physical Therapist [place]    Pager [phone] Acute Rehab Office [phone]

## 2018-08-20 NOTE — Progress Notes (Signed)
Referring Physician(s): Sheral Flow  Supervising Physician: Marybelle Killings  Patient Status:  [facility] - In-pt  Chief Complaint:  Perc chole drain placed in IR 9/26 Slow flow left dialysis fistuula  Subjective:  Perc chole drain intact Pt is better today; no confusion Speaking to me in sentences OP is bloody and drain site is sore  Renal MD asking for IR to evaluate Left dialysis fistula Slow flow; low clearance in dialysis yesterday    Allergies: Avelox [moxifloxacin hcl]; Sulfa antibiotics; and Tetracyclines & related  Medications: Prior to Admission medications   Medication Sig Start Date End Date Taking? Authorizing Provider  aspirin EC 81 MG tablet Take 81 mg every evening by mouth.   Yes [provider]  atorvastatin (LIPITOR) 20 MG tablet Take 20 mg every evening by mouth. 01/29/15  Yes [provider]  buPROPion (WELLBUTRIN XL) 150 MG 24 hr tablet Take 150 mg daily by mouth.   Yes [provider]  docusate sodium (COLACE) 100 MG capsule Take 100 mg 3 (three) times daily by mouth.   Yes [provider]  escitalopram (LEXAPRO) 10 MG tablet Take 10 mg every evening by mouth. 08/30/15  Yes [provider]  HYDROcodone-acetaminophen (NORCO) 10-325 MG tablet Take 1 tablet every 8 (eight) hours as needed by mouth for pain. 01/16/17  Yes [provider]  insulin detemir (LEVEMIR) 100 UNIT/ML injection Inject 50 Units 2 (two) times daily into the skin.   Yes [provider]  insulin lispro (HUMALOG) 100 UNIT/ML injection Inject 10 Units 2 (two) times daily into the skin.   Yes [provider]  lactulose, encephalopathy, (CHRONULAC) 10 GM/15ML SOLN Take 15-30 mLs by mouth daily before supper. 08/02/18  Yes [provider]  levothyroxine (SYNTHROID, LEVOTHROID) 125 MCG tablet Take 125 mcg daily by mouth.   Yes [provider]  lidocaine-prilocaine (EMLA) cream Apply 1 application topically See admin  instructions. To access site (AVF) 1-2 hours before dialysis. Cover with occlusive dressing (Saran warp) 08/02/18  Yes [provider]  linaclotide (LINZESS) 145 MCG CAPS capsule Take 145 mcg daily after supper by mouth.   Yes [provider]  loratadine (CLARITIN) 10 MG tablet Take 10 mg every evening by mouth.   Yes [provider]  montelukast (SINGULAIR) 10 MG tablet Take 10 mg every evening by mouth.   Yes [provider]  oxyCODONE-acetaminophen (PERCOCET) 5-325 MG tablet Take 1-2 tablets by mouth every 6 (six) hours as needed. Patient taking differently: Take 1-2 tablets by mouth every 6 (six) hours as needed for moderate pain.  05/16/16  Yes Delo, Nathaneil Canary, MD  pregabalin (LYRICA) 100 MG capsule Take 100 mg 2 (two) times daily by mouth. 07/23/15  Yes [provider]     Vital Signs: BP 125/77 (BP Location: Right Arm)   Pulse (!) 103   Temp 98.3 F (36.8 C) (Oral)   Resp (!) 23   Ht 5\' 10"  (1.778 m)   Wt 263 lb 6.4 oz (119.5 kg)   SpO2 95%   BMI 37.79 kg/m   Physical Exam  Constitutional: He is oriented to person, place, and time.  Pulmonary/Chest: Effort normal and breath sounds normal.  Abdominal: Soft.  Musculoskeletal: Normal range of motion.  Neurological: He is alert and oriented to person, place, and time.  Skin: Skin is warm and dry.  Site of Perc chole drain is clean and dry Tender to touch OP is bloody   Left arm dialysis access Weak  pulse and weak thrill   Psychiatric: He has a normal mood and affect. His behavior is normal. Judgment and thought content normal.  Vitals reviewed.   Imaging: Mr Abdomen Mrcp Wo Contrast  Result Date: 08/18/2018 CLINICAL DATA:  Elevated liver function tests. Cholelithiasis. Findings suggestive of acute cholecystitis on recent CT and sonography studies. End-stage renal disease on hemodialysis. Abdominal pain. EXAM: MRI ABDOMEN WITHOUT CONTRAST  (INCLUDING MRCP) TECHNIQUE: Multiplanar  multisequence MR imaging of the abdomen was performed. Heavily T2-weighted images of the biliary and pancreatic ducts were obtained, and three-dimensional MRCP images were rendered by post processing. COMPARISON:  08/17/2018 abdominal sonogram and CT abdomen/pelvis. FINDINGS: Lower chest: Hypoventilatory changes at the dependent lung bases. Hepatobiliary: Normal liver size and configuration. No hepatic steatosis. No liver mass. Mildly distended gallbladder. There is a 1.7 cm gallstone in the gallbladder neck. There is layering sludge in the gallbladder. There is new gas in the nondependent fundal gallbladder (series 3/image 25). Moderate diffuse gallbladder wall thickening with trace pericholecystic fluid. No biliary ductal dilatation. Common bile duct diameter 2 mm. No choledocholithiasis. Pancreas: No pancreatic duct dilation. No pancreas divisum. There are several lobulated unilocular small cystic pancreatic lesions scattered throughout the pancreas, largest 1.1 cm in the pancreatic body (series 10/image 60), without appreciable wall thickening or internal complexity. Spleen: Normal size. No mass. Adrenals/Urinary Tract: Normal right adrenal. Left adrenal 1.4 cm nodule with significant loss of signal intensity on out of phase chemical shift imaging, diagnostic of an adenoma. No hydronephrosis. Symmetric renal atrophy. Numerous bilateral renal cysts, largest 2.8 cm in the posterior lower right kidney, several which demonstrate hemorrhagic/proteinaceous signal intensity on T1 imaging, incompletely characterized on this noncontrast scan. Stomach/Bowel: Normal non-distended stomach. Visualized small and large bowel is normal caliber, with no bowel wall thickening. Vascular/Lymphatic: Nonaneurysmal abdominal aorta. No pathologically enlarged lymph nodes in the abdomen. Other: No abdominal ascites or focal fluid collection. Musculoskeletal: No aggressive appearing focal osseous lesions. IMPRESSION: 1. Cholelithiasis  with 1.7 cm gallstone in the gallbladder neck. Moderate diffuse gallbladder wall thickening with trace pericholecystic fluid. New gas in nondependent fundal gallbladder. Findings are compatible with acute emphysematous cholecystitis. 2. No biliary ductal dilatation. CBD diameter 2 mm. No choledocholithiasis. 3. 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 Follow-up MRI abdomen recommended in 2 years. This recommendation follows ACR consensus guidelines: Management of Incidental Pancreatic Cysts: A White Paper of the ACR Incidental Findings Committee. J Am Coll Radiol 0350;09:381-829. 4. Atrophic kidneys with numerous incompletely characterized renal cysts, several of which are hemorrhagic/proteinaceous. Suggest attention on follow-up MRI abdomen in 6 months. 5. Left adrenal adenoma. Electronically Signed   By: Ilona Sorrel M.D.   On: 08/18/2018 23:59   Mr 3d Recon At Scanner  Result Date: 08/18/2018 CLINICAL DATA:  Elevated liver function tests. Cholelithiasis. Findings suggestive of acute cholecystitis on recent CT and sonography studies. End-stage renal disease on hemodialysis. Abdominal pain. EXAM: MRI ABDOMEN WITHOUT CONTRAST  (INCLUDING MRCP) TECHNIQUE: Multiplanar multisequence MR imaging of the abdomen was performed. Heavily T2-weighted images of the biliary and pancreatic ducts were obtained, and three-dimensional MRCP images were rendered by post processing. COMPARISON:  08/17/2018 abdominal sonogram and CT abdomen/pelvis. FINDINGS: Lower chest: Hypoventilatory changes at the dependent lung bases. Hepatobiliary: Normal liver size and configuration. No hepatic steatosis. No liver mass. Mildly distended gallbladder. There is a 1.7 cm gallstone in the gallbladder neck. There is layering sludge in the gallbladder. There is new gas in the nondependent fundal gallbladder (series 3/image 25). Moderate diffuse  gallbladder wall thickening with trace pericholecystic fluid. No biliary  ductal dilatation. Common bile duct diameter 2 mm. No choledocholithiasis. Pancreas: No pancreatic duct dilation. No pancreas divisum. There are several lobulated unilocular small cystic pancreatic lesions scattered throughout the pancreas, largest 1.1 cm in the pancreatic body (series 10/image 60), without appreciable wall thickening or internal complexity. Spleen: Normal size. No mass. Adrenals/Urinary Tract: Normal right adrenal. Left adrenal 1.4 cm nodule with significant loss of signal intensity on out of phase chemical shift imaging, diagnostic of an adenoma. No hydronephrosis. Symmetric renal atrophy. Numerous bilateral renal cysts, largest 2.8 cm in the posterior lower right kidney, several which demonstrate hemorrhagic/proteinaceous signal intensity on T1 imaging, incompletely characterized on this noncontrast scan. Stomach/Bowel: Normal non-distended stomach. Visualized small and large bowel is normal caliber, with no bowel wall thickening. Vascular/Lymphatic: Nonaneurysmal abdominal aorta. No pathologically enlarged lymph nodes in the abdomen. Other: No abdominal ascites or focal fluid collection. Musculoskeletal: No aggressive appearing focal osseous lesions. IMPRESSION: 1. Cholelithiasis with 1.7 cm gallstone in the gallbladder neck. Moderate diffuse gallbladder wall thickening with trace pericholecystic fluid. New gas in nondependent fundal gallbladder. Findings are compatible with acute emphysematous cholecystitis. 2. No biliary ductal dilatation. CBD diameter 2 mm. No choledocholithiasis. 3. 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 Follow-up MRI abdomen recommended in 2 years. This recommendation follows ACR consensus guidelines: Management of Incidental Pancreatic Cysts: A White Paper of the ACR Incidental Findings Committee. J Am Coll Radiol 7544;92:010-071. 4. Atrophic kidneys with numerous incompletely characterized renal cysts, several of which are  hemorrhagic/proteinaceous. Suggest attention on follow-up MRI abdomen in 6 months. 5. Left adrenal adenoma. Electronically Signed   By: Ilona Sorrel M.D.   On: 08/18/2018 23:59   Ir Perc Cholecystostomy  Result Date: 08/19/2018 INDICATION: 77 year old male with acute cholecystitis EXAM: IS IMAGE GUIDED PERCUTANEOUS CHOLECYSTOSTOMY MEDICATIONS: In house antibiotics ANESTHESIA/SEDATION: Moderate (conscious) sedation was employed during this procedure. A total of Versed 1.0 mg and Fentanyl 50 mcg was administered intravenously. Moderate Sedation Time: 10 minutes. The patient's level of consciousness and vital signs were monitored continuously by radiology nursing throughout the procedure under my direct supervision. FLUOROSCOPY TIME:  Fluoroscopy Time: 0 minutes 18 seconds (25 mGy). COMPLICATIONS: None PROCEDURE: Informed written consent was obtained from the patient and the patient's family after a thorough discussion of the procedural risks, benefits and alternatives. All questions were addressed. Maximal Sterile Barrier Technique was utilized including caps, mask, sterile gowns, sterile gloves, sterile drape, hand hygiene and skin antiseptic. A timeout was performed prior to the initiation of the procedure. Ultrasound survey of the right upper quadrant was performed for planning purposes. Once the patient is prepped and draped in the usual sterile fashion, the skin and subcutaneous tissues overlying the gallbladder were generously infiltrated 1% lidocaine for local anesthesia. A coaxial needle was advanced under ultrasound guidance through the skin subcutaneous tissues and a small segment of liver into the gallbladder lumen. With removal of the stylet, spontaneous dark bile drainage occurred. Using modified Seldinger technique, a 10 French drain was placed into the gallbladder fossa, with aspiration of the sample for the lab. Contrast injection confirmed position of the tube within the gallbladder lumen.  Drainage catheter was attached to gravity drain with a suture retention placed. Patient tolerated the procedure well and remained hemodynamically stable throughout. No complications were encountered and no significant blood loss encountered. IMPRESSION: Status post image guided percutaneous cholecystostomy. Signed, Dulcy Fanny. Dellia Nims, [facility] Electronically  Signed   By: Corrie Mckusick D.O.   On: 08/19/2018 16:23    Labs:  CBC: Recent Labs    10/05/17 1929 08/18/18 0605 08/19/18 0433 08/20/18 0416  WBC 7.6 18.4* 16.4* 14.6*  HGB 14.6 15.9 15.6 16.0  HCT 45.3 49.0 48.0 48.8  PLT 159 156 148* 169    COAGS: Recent Labs    08/19/18 1231  INR 1.22    BMP: Recent Labs    08/18/18 0605 08/18/18 1423 08/19/18 0433 08/20/18 0419  NA 137 140 137 137  K 7.3* 5.9* 5.7* 5.6*  CL 94* 95* 94* 95*  CO2 22 24 25 23   GLUCOSE 382* 186* 269* 280*  BUN 103* 59* 67* 63*  CALCIUM 8.8* 9.0 9.5 9.7  CREATININE 11.80* 8.36* 9.13* 6.82*  GFRNONAA 4* 5* 5* 7*  GFRAA 4* 6* 6* 8*    LIVER FUNCTION TESTS: Recent Labs    08/18/18 0605 08/18/18 1423 08/19/18 0433 08/20/18 0419  BILITOT 4.4* 5.0* 4.5*  --   AST 303* 206* 91*  --   ALT 499* 416* 278*  --   ALKPHOS 333* 332* 330*  --   PROT 6.4* 6.3* 6.4*  --   ALBUMIN 3.0* 2.9* 2.7* 2.6*    Assessment and Plan:  Percutaneous cholecystostomy drain in place OP bloody So much better today; cognition wnl Will follow Need to remain in place 6-8 weeks unless to OR  Left dialysis graft slow flow Scheduled for fistulogram with possible intervention in IR today Pt is aware of procedure benefits and risks Including but not limited to--- infection; bleeding; vessel damage; damage to surrounding structures Agreeable to proceed' Consent signed and in chart    Electronically Signed: Ejay Lashley A, PA-C 08/20/2018, 11:19 AM   I spent a total of 25 Minutes at the the  patient's bedside AND on the patient's hospital floor or unit, greater than 50% of which was counseling/coordinating care for perc chole drain and left arm dialysis access evaluation

## 2018-08-20 NOTE — Progress Notes (Signed)
Inpatient Diabetes Program Recommendations  AACE/ADA: New Consensus Statement on Inpatient Glycemic Control (2015)  Target Ranges:  Prepandial:   less than 140 mg/dL      Peak postprandial:   less than 180 mg/dL (1-2 hours)      Critically ill patients:  140 - 180 mg/dL   Lab Results  Component Value Date   GLUCAP 267 (H) 08/20/2018   HGBA1C 9.7 (H) 08/17/2018    Review of Glycemic Control  Diabetes history: DM2 Outpatient Diabetes medications: Levemir 50 units bid, Humalog 10 units bid Current orders for Inpatient glycemic control: Novolog 0-9 units tidwc, Levemir 5 units bid  HgbA1C - 9.7%  Spoke briefly with pt regarding his HgbA1C of 9.7%. Pt states he takes his insulin and checks his blood sugars at home. Denies any hypoglycemia. Stressed importance of f/u with PCP for diabetes management.   Inpatient Diabetes Program Recommendations:     Increase Levemir to 10 units bid Add Novolog 4 units tidwc for meal coverage insulin if pt eats > 50% meal F/U with PCP for diabetes management.  Thank you. Lorenda Peck, RD, LDN, CDE Inpatient Diabetes Coordinator [phone]

## 2018-08-20 NOTE — Progress Notes (Signed)
Patient refused CPAP HS tonight 

## 2018-08-20 NOTE — Care Management Important Message (Signed)
Important Message  Patient Details  Name: Christian Dalton MRN: [id number] Date of Birth: 01-Jul-1941   Medicare Important Message Given:  Yes    [name] [address] 08/20/2018, 3:46 PM

## 2018-08-20 NOTE — Progress Notes (Signed)
Patient with episodes of intermittent confusion  tonight  with 2 attempts to get out of bed. Patient noted to have pulled out PIV; new PIV inserted.  No episodes of N/V . Staff continues to redirect and reorient pt. Bed in low position and bed alarm on
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
## 2018-08-21 DIAGNOSIS — Z992 Dependence on renal dialysis: Secondary | ICD-10-CM

## 2018-08-21 DIAGNOSIS — R5381 Other malaise: Secondary | ICD-10-CM

## 2018-08-21 DIAGNOSIS — N186 End stage renal disease: Secondary | ICD-10-CM

## 2018-08-21 DIAGNOSIS — Z794 Long term (current) use of insulin: Secondary | ICD-10-CM

## 2018-08-21 DIAGNOSIS — K810 Acute cholecystitis: Secondary | ICD-10-CM

## 2018-08-21 DIAGNOSIS — E1165 Type 2 diabetes mellitus with hyperglycemia: Secondary | ICD-10-CM

## 2018-08-21 LAB — COMPREHENSIVE METABOLIC PANEL
ALT: 115 U/L — ABNORMAL HIGH (ref 0–44)
AST: 23 U/L (ref 15–41)
Albumin: 2.4 g/dL — ABNORMAL LOW (ref 3.5–5.0)
Alkaline Phosphatase: 284 U/L — ABNORMAL HIGH (ref 38–126)
Anion gap: 22 — ABNORMAL HIGH (ref 5–15)
BUN: 93 mg/dL — ABNORMAL HIGH (ref 8–23)
CO2: 22 mmol/L (ref 22–32)
Calcium: 9.5 mg/dL (ref 8.9–10.3)
Chloride: 88 mmol/L — ABNORMAL LOW (ref 98–111)
Creatinine, Ser: 7.98 mg/dL — ABNORMAL HIGH (ref 0.61–1.24)
GFR calc Af Amer: 7 mL/min — ABNORMAL LOW (ref >60)
GFR calc non Af Amer: 6 mL/min — ABNORMAL LOW (ref >60)
Glucose, Bld: 179 mg/dL — ABNORMAL HIGH (ref 70–99)
Potassium: 4.7 mmol/L (ref 3.5–5.1)
Sodium: 132 mmol/L — ABNORMAL LOW (ref 135–145)
Total Bilirubin: 1.7 mg/dL — ABNORMAL HIGH (ref 0.3–1.2)
Total Protein: 6.5 g/dL (ref 6.5–8.1)

## 2018-08-21 LAB — CBC
HCT: 45.4 % (ref 39.0–52.0)
Hemoglobin: 15 g/dL (ref 13.0–17.0)
MCH: 30.4 pg (ref 26.0–34.0)
MCHC: 33 g/dL (ref 30.0–36.0)
MCV: 92.1 fL (ref 78.0–100.0)
Platelets: 156 10*3/uL (ref 150–400)
RBC: 4.93 MIL/uL (ref 4.22–5.81)
RDW: 15.6 % — ABNORMAL HIGH (ref 11.5–15.5)
WBC: 10.7 10*3/uL — ABNORMAL HIGH (ref 4.0–10.5)

## 2018-08-21 LAB — GLUCOSE, CAPILLARY
Glucose-Capillary: 131 mg/dL — ABNORMAL HIGH (ref 70–99)
Glucose-Capillary: 182 mg/dL — ABNORMAL HIGH (ref 70–99)
Glucose-Capillary: 174 mg/dL — ABNORMAL HIGH (ref 70–99)

## 2018-08-21 LAB — MAGNESIUM: Magnesium: 2.6 mg/dL — ABNORMAL HIGH (ref 1.7–2.4)

## 2018-08-21 LAB — PHOSPHORUS: Phosphorus: 9.7 mg/dL — ABNORMAL HIGH (ref 2.5–4.6)

## 2018-08-21 MED ORDER — ACETAMINOPHEN 325 MG PO TABS
ORAL_TABLET | ORAL | Status: AC
  Administered 2018-08-21: 650 mg via ORAL
  Filled 2018-08-21: qty 2

## 2018-08-21 MED ORDER — CALCITRIOL 1 MCG/ML IV SOLN
INTRAVENOUS | Status: AC
  Filled 2018-08-21: qty 1

## 2018-08-21 MED ORDER — LIDOCAINE HCL (PF) 1 % IJ SOLN: 5.0000 mL | INTRAMUSCULAR | Status: DC | PRN

## 2018-08-21 MED ORDER — HEPARIN SODIUM (PORCINE) 1000 UNIT/ML DIALYSIS: 1000.0000 [IU] | INTRAMUSCULAR | Status: DC | PRN

## 2018-08-21 MED ORDER — OXYCODONE HCL 5 MG PO TABS
5.0000 mg | ORAL_TABLET | ORAL | Status: DC | PRN
  Administered 2018-08-21 – 2018-08-23 (×10): 5 mg via ORAL
  Filled 2018-08-21 (×10): qty 1

## 2018-08-21 MED ORDER — SODIUM CHLORIDE 0.9 % IV SOLN: 100.0000 mL | INTRAVENOUS | Status: DC | PRN

## 2018-08-21 MED ORDER — OXYCODONE HCL 5 MG PO TABS
2.5000 mg | ORAL_TABLET | ORAL | Status: DC | PRN
  Administered 2018-08-21: 2.5 mg via ORAL
  Filled 2018-08-21: qty 1

## 2018-08-21 MED ORDER — PENTAFLUOROPROP-TETRAFLUOROETH EX AERO: 1.0000 "application " | INHALATION_SPRAY | CUTANEOUS | Status: DC | PRN

## 2018-08-21 MED ORDER — PROMETHAZINE HCL 25 MG/ML IJ SOLN
12.5000 mg | Freq: Once | INTRAMUSCULAR | Status: AC
  Administered 2018-08-21: 12.5 mg via INTRAVENOUS
  Filled 2018-08-21: qty 1

## 2018-08-21 MED ORDER — HEPARIN SODIUM (PORCINE) 1000 UNIT/ML DIALYSIS: 20.0000 [IU]/kg | INTRAMUSCULAR | Status: DC | PRN

## 2018-08-21 MED ORDER — CALCITRIOL 0.5 MCG PO CAPS
ORAL_CAPSULE | ORAL | Status: AC
  Administered 2018-08-21: 1 ug via ORAL
  Filled 2018-08-21: qty 2

## 2018-08-21 MED ORDER — LIDOCAINE-PRILOCAINE 2.5-2.5 % EX CREA: 1.0000 "application " | TOPICAL_CREAM | CUTANEOUS | Status: DC | PRN

## 2018-08-21 MED ORDER — ALTEPLASE 2 MG IJ SOLR: 2.0000 mg | Freq: Once | INTRAMUSCULAR | Status: DC | PRN

## 2018-08-21 MED ORDER — BLISTEX MEDICATED EX OINT
TOPICAL_OINTMENT | CUTANEOUS | Status: DC | PRN
  Administered 2018-08-21: 1 via TOPICAL
  Filled 2018-08-21 (×2): qty 6.3

## 2018-08-21 MED ORDER — ACETAMINOPHEN 325 MG PO TABS
650.0000 mg | ORAL_TABLET | Freq: Once | ORAL | Status: AC
  Administered 2018-08-21: 650 mg via ORAL

## 2018-08-21 NOTE — Progress Notes (Deleted)
[facility] Progress Note   Dialysis Orders: [place] TTS 4h 500/800 EDW 118kg 2K/2.25Ca L AVF Heparin bolus 7000 U Calcitriol 1.0 mcg TIW Auyrxia 3 tabs qactid  Assessment/Plan: 1 acute cholecystitis - s/p IR cholecystostomy/drain - on Zosyn, LFTs improving- still pain - defer to primary 2. ESRD - TTS -K 5.7 > 4.7 with veltassa yesterday - had neg fistulagram -  3. Anemia - hgb 15 no ESA 4. Secondary hyperparathyroidism - current meds - surprised so high - P 9.7 - did miss one dose but on 3 ac which is very high esp given high hgb -may need to change to non Fe based binder- follow for now 5. BP/volume - net UF 1 L Wed, 741 ml Thurs - goal 2.5 - pre HD wt 119.7 nonstanding- keeping SBP over a 100 6. Nutrition - renal carb mod/renavite- little intake recorded yesterday 7. Hyperkalemia - K still persistent without much change in BUN after 4 hour dialysis on a 1 K bath - was not at full BFR of 500 but should have gotten better clearances -  fistulagram  Neg at full BFR  - 8. DM - per primary  Myriam Jacobson, PA-C [facility] Beeper [phone] 08/21/2018,9:39 AM  LOS: 4 days   Subjective:   Still fairly groggy, but less so than yesterday - c/o of belly pain   Objective Vitals:   08/20/18 1200 08/20/18 1934 08/20/18 2333 08/21/18 0335  BP:  113/79 128/62 130/70  Pulse:  97 86 84  Resp:  (!) 21 17 19   Temp: 98.4 F (36.9 C) 98.3 F (36.8 C) 98.1 F (36.7 C) 98 F (36.7 C)  TempSrc: Oral Oral Oral Oral  SpO2:  94% 95% 97%  Weight:      Height:       Physical Exam General: almost supine in bed - sat ok on O2 Heart: RRR Lungs: clear anteriorly Abdomen: very distended, obese tender throughout - right side moreso + BS Extremities:no LE edema SCDs in place Dialysis Access:  Left AVF Qb 500   Additional Objective Labs: Basic Metabolic Panel: Recent Labs  Lab 08/19/18 0433 08/20/18 0419 08/21/18 0313  NA 137 137 132*  K 5.7* 5.6* 4.7  CL  94* 95* 88*  CO2 25 23 22   GLUCOSE 269* 280* 179*  BUN 67* 63* 93*  CREATININE 9.13* 6.82* 7.98*  CALCIUM 9.5 9.7 9.5  PHOS  --  8.6* 9.7*   Liver Function Tests: Recent Labs  Lab 08/18/18 1423 08/19/18 0433 08/20/18 0419 08/21/18 0313  AST 206* 91*  --  23  ALT 416* 278*  --  115*  ALKPHOS 332* 330*  --  284*  BILITOT 5.0* 4.5*  --  1.7*  PROT 6.3* 6.4*  --  6.5  ALBUMIN 2.9* 2.7* 2.6* 2.4*   Recent Labs  Lab 08/19/18 0433  LIPASE 30   CBC: Recent Labs  Lab 08/18/18 0605 08/19/18 0433 08/20/18 0416 08/21/18 0313  WBC 18.4* 16.4* 14.6* 10.7*  HGB 15.9 15.6 16.0 15.0  HCT 49.0 48.0 48.8 45.4  MCV 96.3 95.0 94.6 92.1  PLT 156 148* 169 156   Blood Culture    Component Value Date/Time   SDES BILE 08/19/2018 1620   SPECREQUEST NONE 08/19/2018 1620   CULT  08/19/2018 1620    NO GROWTH < 24 HOURS Performed at [facility], [address], [place], [place] [address]    REPTSTATUS PENDING 08/19/2018 1620    Cardiac Enzymes: No  results for input(s): CKTOTAL, CKMB, CKMBINDEX, TROPONINI in the last 168 hours. CBG: Recent Labs  Lab 08/19/18 2108 08/20/18 0828 08/20/18 1201 08/20/18 1752 08/20/18 2218  GLUCAP 269* 250* 267* 256* 189*   Iron Studies: No results for input(s): IRON, TIBC, TRANSFERRIN, FERRITIN in the last 72 hours. Lab Results  Component Value Date   INR 1.22 08/19/2018   Studies/Results: Ir Perc Cholecystostomy  Result Date: 08/19/2018 INDICATION: 77 year old male with acute cholecystitis EXAM: IS IMAGE GUIDED PERCUTANEOUS CHOLECYSTOSTOMY MEDICATIONS: In house antibiotics ANESTHESIA/SEDATION: Moderate (conscious) sedation was employed during this procedure. A total of Versed 1.0 mg and Fentanyl 50 mcg was administered intravenously. Moderate Sedation Time: 10 minutes. The patient's level of consciousness and vital signs were monitored continuously by radiology nursing throughout the procedure under my direct supervision. FLUOROSCOPY TIME:   Fluoroscopy Time: 0 minutes 18 seconds (25 mGy). COMPLICATIONS: None PROCEDURE: Informed written consent was obtained from the patient and the patient's family after a thorough discussion of the procedural risks, benefits and alternatives. All questions were addressed. Maximal Sterile Barrier Technique was utilized including caps, mask, sterile gowns, sterile gloves, sterile drape, hand hygiene and skin antiseptic. A timeout was performed prior to the initiation of the procedure. Ultrasound survey of the right upper quadrant was performed for planning purposes. Once the patient is prepped and draped in the usual sterile fashion, the skin and subcutaneous tissues overlying the gallbladder were generously infiltrated 1% lidocaine for local anesthesia. A coaxial needle was advanced under ultrasound guidance through the skin subcutaneous tissues and a small segment of liver into the gallbladder lumen. With removal of the stylet, spontaneous dark bile drainage occurred. Using modified Seldinger technique, a 10 French drain was placed into the gallbladder fossa, with aspiration of the sample for the lab. Contrast injection confirmed position of the tube within the gallbladder lumen. Drainage catheter was attached to gravity drain with a suture retention placed. Patient tolerated the procedure well and remained hemodynamically stable throughout. No complications were encountered and no significant blood loss encountered. IMPRESSION: Status post image guided percutaneous cholecystostomy. Signed, Dulcy Fanny. Dellia Nims, RPVI [facility]iology [facility] Radiology Electronically Signed   By: Corrie Mckusick D.O.   On: 08/19/2018 16:23   Ir Dialy Shunt Intro Needle/intracath Initial W/img Left  Result Date: 08/20/2018 INDICATION: Malfunction EXAM: AV FISTULAGRAM MEDICATIONS: None. ANESTHESIA/SEDATION: Moderate Sedation Time: The patient was continuously monitored during the procedure by the  interventional radiology nurse under my direct supervision. FLUOROSCOPY TIME:  Fluoroscopy Time:  minutes 12 seconds (28 mGy). COMPLICATIONS: None immediate. PROCEDURE: Informed written consent was obtained from the patient after a thorough discussion of the procedural risks, benefits and alternatives. All questions were addressed. Maximal Sterile Barrier Technique was utilized including caps, mask, sterile gowns, sterile gloves, sterile drape, hand hygiene and skin antiseptic. A timeout was performed prior to the initiation of the procedure. The left arm was prepped and draped in a sterile fashion. An 18 gauge Angiocath was inserted into the outflow vein. Contrast was injected. The Angiocath was removed and hemostasis was achieved with direct pressure. FINDINGS: The arteriovenous anastomosis in the antecubital fossa, outflow cephalic vein, and central venous structures are widely patent. IMPRESSION: Left arm AV fistula circuit is patent. ACCESS: This access remains amenable to future percutaneous interventions as clinically indicated. Electronically Signed   By: Marybelle Killings M.D.   On: 08/20/2018 14:13   Medications: . sodium chloride    . sodium chloride    . piperacillin-tazobactam (ZOSYN)  IV  3.375 g (08/20/18 1621)   . calcitRIOL  1 mcg Oral Q T,Th,Sa-HD  . Chlorhexidine Gluconate Cloth  6 each Topical Q0600  . escitalopram  10 mg Oral QPM  . feeding supplement (PRO-STAT SUGAR FREE 64)  30 mL Oral BID  . ferric citrate  630 mg Oral TID WC  . heparin injection (subcutaneous)  5,000 Units Subcutaneous Q8H  . insulin aspart  0-9 Units Subcutaneous TID WC  . insulin detemir  10 Units Subcutaneous BID  . levothyroxine  125 mcg Oral QAC breakfast  . montelukast  10 mg Oral QPM  . multivitamin  1 tablet Oral QHS  . sodium chloride flush  5 mL Intracatheter Q8H

## 2018-08-21 NOTE — Progress Notes (Addendum)
[facility] Progress Note   Dialysis Orders:  TTS 4h 500/800 EDW 118kg 2K/2.25Ca L AVF Heparin bolus 7000 U Calcitriol 1.0 mcg TIW Auyrxia 3 tabs qactid  Assessment/Plan: 1 acute cholecystitis - s/p IR cholecystostomy/drain - on Zosyn, LFTs improving- still pain - defer to primary 2. ESRD - TTS -K 5.7 > 4.7 with veltassa yesterday - had neg fistulagram -  3. Anemia - hgb 15 no ESA 4. Secondary hyperparathyroidism - current meds - surprised so high - P 9.7 - did miss one dose but on 3 ac which is very high esp given high hgb -may need to change to non Fe based binder- follow for now 5. BP/volume - net UF 1 L Wed, 741 ml Thurs - goal 2.5 - pre HD wt 119.7 nonstanding- keeping SBP over a 100 6. Nutrition - renal carb mod/renavite- little intake recorded yesterday 7. Hyperkalemia - K still persistent without much change in BUN after 4 hour dialysis on a 1 K bath - was not at full BFR of 500 but should have gotten better clearances -  fistulagram  Neg at full BFR  - 8. DM - per primary  Myriam Jacobson, PA-C [place] [phone] 08/21/2018,8:13 AM  LOS: 4 days   Subjective:   Still fairly groggy, but less so than yesterday - c/o of belly pain   Objective Vitals:   08/20/18 1200 08/20/18 1934 08/20/18 2333 08/21/18 0335  BP:  113/79 128/62 130/70  Pulse:  97 86 84  Resp:  (!) 21 17 19   Temp: 98.4 F (36.9 C) 98.3 F (36.8 C) 98.1 F (36.7 C) 98 F (36.7 C)  TempSrc: Oral Oral Oral Oral  SpO2:  94% 95% 97%  Weight:      Height:       Physical Exam General: almost supine in bed - sat ok on O2 Heart: RRR Lungs: clear anteriorly Abdomen: very distended, obese tender throughout - right side moreso + BS Extremities:no LE edema SCDs in place Dialysis Access:  Left AVF Qb 500   Additional Objective Labs: Basic Metabolic Panel: Recent Labs  Lab 08/19/18 0433 08/20/18 0419 08/21/18 0313  NA 137 137 132*  K 5.7* 5.6* 4.7  CL  94* 95* 88*  CO2 25 23 22   GLUCOSE 269* 280* 179*  BUN 67* 63* 93*  CREATININE 9.13* 6.82* 7.98*  CALCIUM 9.5 9.7 9.5  PHOS  --  8.6* 9.7*   Liver Function Tests: Recent Labs  Lab 08/18/18 1423 08/19/18 0433 08/20/18 0419 08/21/18 0313  AST 206* 91*  --  23  ALT 416* 278*  --  115*  ALKPHOS 332* 330*  --  284*  BILITOT 5.0* 4.5*  --  1.7*  PROT 6.3* 6.4*  --  6.5  ALBUMIN 2.9* 2.7* 2.6* 2.4*   Recent Labs  Lab 08/19/18 0433  LIPASE 30   CBC: Recent Labs  Lab 08/18/18 0605 08/19/18 0433 08/20/18 0416 08/21/18 0313  WBC 18.4* 16.4* 14.6* 10.7*  HGB 15.9 15.6 16.0 15.0  HCT 49.0 48.0 48.8 45.4  MCV 96.3 95.0 94.6 92.1  PLT 156 148* 169 156   Blood Culture    Component Value Date/Time   SDES BILE 08/19/2018 1620   SPECREQUEST NONE 08/19/2018 1620   CULT  08/19/2018 1620    NO GROWTH < 24 HOURS Performed at [facility], [address], [place], [place] [address]    REPTSTATUS PENDING 08/19/2018 1620    Cardiac Enzymes: No  results for input(s): CKTOTAL, CKMB, CKMBINDEX, TROPONINI in the last 168 hours. CBG: Recent Labs  Lab 08/19/18 2108 08/20/18 0828 08/20/18 1201 08/20/18 1752 08/20/18 2218  GLUCAP 269* 250* 267* 256* 189*   Iron Studies: No results for input(s): IRON, TIBC, TRANSFERRIN, FERRITIN in the last 72 hours. Lab Results  Component Value Date   INR 1.22 08/19/2018   Studies/Results: Ir Perc Cholecystostomy  Result Date: 08/19/2018 INDICATION: 77 year old male with acute cholecystitis EXAM: IS IMAGE GUIDED PERCUTANEOUS CHOLECYSTOSTOMY MEDICATIONS: In house antibiotics ANESTHESIA/SEDATION: Moderate (conscious) sedation was employed during this procedure. A total of Versed 1.0 mg and Fentanyl 50 mcg was administered intravenously. Moderate Sedation Time: 10 minutes. The patient's level of consciousness and vital signs were monitored continuously by radiology nursing throughout the procedure under my direct supervision. FLUOROSCOPY TIME:   Fluoroscopy Time: 0 minutes 18 seconds (25 mGy). COMPLICATIONS: None PROCEDURE: Informed written consent was obtained from the patient and the patient's family after a thorough discussion of the procedural risks, benefits and alternatives. All questions were addressed. Maximal Sterile Barrier Technique was utilized including caps, mask, sterile gowns, sterile gloves, sterile drape, hand hygiene and skin antiseptic. A timeout was performed prior to the initiation of the procedure. Ultrasound survey of the right upper quadrant was performed for planning purposes. Once the patient is prepped and draped in the usual sterile fashion, the skin and subcutaneous tissues overlying the gallbladder were generously infiltrated 1% lidocaine for local anesthesia. A coaxial needle was advanced under ultrasound guidance through the skin subcutaneous tissues and a small segment of liver into the gallbladder lumen. With removal of the stylet, spontaneous dark bile drainage occurred. Using modified Seldinger technique, a 10 French drain was placed into the gallbladder fossa, with aspiration of the sample for the lab. Contrast injection confirmed position of the tube within the gallbladder lumen. Drainage catheter was attached to gravity drain with a suture retention placed. Patient tolerated the procedure well and remained hemodynamically stable throughout. No complications were encountered and no significant blood loss encountered. IMPRESSION: Status post image guided percutaneous cholecystostomy. Signed, Dulcy Fanny. Dellia Nims, RPVI [facility] Radiology Electronically Signed   By: Corrie Mckusick D.O.   On: 08/19/2018 16:23   Ir Dialy Shunt Intro Needle/intracath Initial W/img Left  Result Date: 08/20/2018 INDICATION: Malfunction EXAM: AV FISTULAGRAM MEDICATIONS: None. ANESTHESIA/SEDATION: Moderate Sedation Time: The patient was continuously monitored during the procedure by the  interventional radiology nurse under my direct supervision. FLUOROSCOPY TIME:  Fluoroscopy Time:  minutes 12 seconds (28 mGy). COMPLICATIONS: None immediate. PROCEDURE: Informed written consent was obtained from the patient after a thorough discussion of the procedural risks, benefits and alternatives. All questions were addressed. Maximal Sterile Barrier Technique was utilized including caps, mask, sterile gowns, sterile gloves, sterile drape, hand hygiene and skin antiseptic. A timeout was performed prior to the initiation of the procedure. The left arm was prepped and draped in a sterile fashion. An 18 gauge Angiocath was inserted into the outflow vein. Contrast was injected. The Angiocath was removed and hemostasis was achieved with direct pressure. FINDINGS: The arteriovenous anastomosis in the antecubital fossa, outflow cephalic vein, and central venous structures are widely patent. IMPRESSION: Left arm AV fistula circuit is patent. ACCESS: This access remains amenable to future percutaneous interventions as clinically indicated. Electronically Signed   By: Marybelle Killings M.D.   On: 08/20/2018 14:13   Medications: . sodium chloride    . sodium chloride    . piperacillin-tazobactam (ZOSYN)  IV  3.375 g (08/20/18 1621)   . calcitRIOL  1 mcg Oral Q T,Th,Sa-HD  . Chlorhexidine Gluconate Cloth  6 each Topical Q0600  . escitalopram  10 mg Oral QPM  . feeding supplement (PRO-STAT SUGAR FREE 64)  30 mL Oral BID  . ferric citrate  630 mg Oral TID WC  . heparin injection (subcutaneous)  5,000 Units Subcutaneous Q8H  . insulin aspart  0-9 Units Subcutaneous TID WC  . insulin detemir  10 Units Subcutaneous BID  . levothyroxine  125 mcg Oral QAC breakfast  . montelukast  10 mg Oral QPM  . multivitamin  1 tablet Oral QHS  . sodium chloride flush  5 mL Intracatheter Q8H    Nephrology Attending: Seen and examined at bedside. Chart reviewed. I agree with advanced practitioner's progress note including a/p as  outlined above.   S/p IR cholecystostomy, on IV zosyn.  ESRD: dialysis today, tolerating well. C/o abdomen pain for which he is receiving tylenol. Tolerating HD well. Changed K bath to 2 K. Fistulogram with patent fistula.   Lawson Radar, MD [facility]

## 2018-08-21 NOTE — Progress Notes (Signed)
Legacy Lacivita  WUJ:[id number] DOB: Sep 13, 1941 DOA: 08/17/2018 PCP: Emmaline Kluver, MD    Brief Narrative:  77 y.o. male w/ a hx of ESRD on hemodialysis, diabetes, and hypertension who presented with epigastric pain. An US showed acute cholecystitis.  Subjective: Patient still complains of abdominal pain, reports it is with no change, no nausea or no vomiting .  Assessment & Plan:  Sepsis due to acute cholecystitis -Sepsis criteria met on admission as had leukocytosis, tachycardia with altered mental status -Significant for acute cholecystitis, general surgery input greatly appreciated, MRI was significant for 1.7 gallstone in gallbladder neck no common bile duct dilation, patient was high risk for surgery, so went for  percutaneous drain per IR 08/19/2018  -Leukocytosis trending down, 10 you with IV Zosyn, management per general surgery  Acute encephalopathy multifactorial in setting of narcotic use, end-stage renal disease, and sepsis - mental status significantly improved, back to baseline 1.1cm Cystic lesion in pancreatic body  Will need f/u MRI in outpt setting (formal rec is in 2 years)   ESRD on dialysis Care per Nephrology  DM2  CBG trending upward - diet being advanced - adjust tx and follow   Obesity, Class III, BMI 40-49.9 (morbid obesity)  Hyperkalemia Nephrology is addressing   DVT prophylaxis: SQ heparin  Code Status: FULL CODE Family Communication: Wife at bedside Disposition Plan: CIR consulted  Consultants:  Gen Surgery Nephrology  GI IR  Antimicrobials:  Zosyn 9/24 >  Objective: Blood pressure 105/72, pulse 99, temperature 98.4 F (36.9 C), temperature source Oral, resp. rate 14, height _0  (1.778 m), weight 118.1 kg, SpO2 95 %.  Intake/Output Summary (Last 24 hours) at 08/21/2018 1611 Last data filed at 08/21/2018 1534 Gross per 24 hour  Intake 331.37 ml  Output 1600 ml  Net -1268.63 ml   Filed Weights   08/21/18 0700 08/21/18  1115 08/21/18 1200  Weight: 119.7 kg 117.8 kg 118.1 kg    Examination:  Awake Alert, Oriented X 3, No new F.N deficits, Normal affect Symmetrical Chest wall movement, Good air movement bilaterally, CTAB RRR,No Gallops,Rubs or new Murmurs, No Parasternal Heave +ve B.Sounds, Abd Soft, upper quadrant drain with black fluid, has some mild tenderness around insertion site  no Cyanosis, Clubbing or edema, No new Rash or bruise     CBC: Recent Labs  Lab 08/19/18 0433 08/20/18 0416 08/21/18 0313  WBC 16.4* 14.6* 10.7*  HGB 15.6 16.0 15.0  HCT 48.0 48.8 45.4  MCV 95.0 94.6 92.1  PLT 148* 169 956   Basic Metabolic Panel: Recent Labs  Lab 08/19/18 0433 08/20/18 0419 08/21/18 0313  NA 137 137 132*  K 5.7* 5.6* 4.7  CL 94* 95* 88*  CO2 _1 GLUCOSE 269* 280* 179*  BUN 67* 63* 93*  CREATININE 9.13* 6.82* 7.98*  CALCIUM 9.5 9.7 9.5  MG  --   --  2.6*  PHOS  --  8.6* 9.7*   GFR: Estimated Creatinine Clearance: 10.3 mL/min (A) (by C-G formula based on SCr of 7.98 mg/dL (H)).  Liver Function Tests: Recent Labs  Lab 08/18/18 0605 08/18/18 1423 08/19/18 0433 08/20/18 0419 08/21/18 0313  AST 303* 206* 91*  --  23  ALT 499* 416* 278*  --  115*  ALKPHOS 333* 332* 330*  --  284*  BILITOT 4.4* 5.0* 4.5*  --  1.7*  PROT 6.4* 6.3* 6.4*  --  6.5  ALBUMIN 3.0* 2.9* 2.7* 2.6* 2.4*   Recent Labs  Lab  08/19/18 0433  LIPASE 30   Recent Labs  Lab 08/20/18 0419  AMMONIA 26    Coagulation Profile: Recent Labs  Lab 08/19/18 1231  INR 1.22   HbA1C: Hgb A1c MFr Bld  Date/Time Value Ref Range Status  08/17/2018 07:18 PM 9.7 (H) 4.8 - 5.6 % Final    Comment:    (NOTE) Pre diabetes:          5.7%-6.4% Diabetes:              >6.4% Glycemic control for   <7.0% adults with diabetes     CBG: Recent Labs  Lab 08/20/18 0828 08/20/18 1201 08/20/18 1752 08/20/18 2218 08/21/18 1207  GLUCAP 250* 267* 256* 189* 131*    Recent Results (from the past 240 hour(s))    Surgical pcr screen     Status: None   Collection Time: 08/17/18  8:59 PM  Result Value Ref Range Status   MRSA, PCR NEGATIVE NEGATIVE Final   Staphylococcus aureus NEGATIVE NEGATIVE Final    Comment: (NOTE) The Xpert SA Assay (FDA approved for NASAL specimens in patients 51 years of age and older), is one component of a comprehensive surveillance program. It is not intended to diagnose infection nor to guide or monitor treatment. Performed at [facility], [place] [address], [place], [place] [address]   Aerobic/Anaerobic Culture (surgical/deep wound)     Status: None (Preliminary result)   Collection Time: 08/19/18  4:20 PM  Result Value Ref Range Status   Specimen Description BILE  Final   Special Requests NONE  Final   Gram Stain   Final    WBC PRESENT, PREDOMINANTLY MONONUCLEAR NO ORGANISMS SEEN    Culture   Final    NO GROWTH 2 DAYS NO ANAEROBES ISOLATED; CULTURE IN PROGRESS FOR 5 DAYS Performed at [facility], [place] [address], [place], [place] [address]    Report Status PENDING  Incomplete     Scheduled Meds: . calcitRIOL  1 mcg Oral Q T,Th,Sa-HD  . Chlorhexidine Gluconate Cloth  6 each Topical Q0600  . escitalopram  10 mg Oral QPM  . feeding supplement (PRO-STAT SUGAR FREE 64)  30 mL Oral BID  . ferric citrate  630 mg Oral TID WC  . heparin injection (subcutaneous)  5,000 Units Subcutaneous Q8H  . insulin aspart  0-9 Units Subcutaneous TID WC  . insulin detemir  10 Units Subcutaneous BID  . levothyroxine  125 mcg Oral QAC breakfast  . montelukast  10 mg Oral QPM  . multivitamin  1 tablet Oral QHS  . sodium chloride flush  5 mL Intracatheter Q8H   Continuous Infusions: . sodium chloride    . sodium chloride    . piperacillin-tazobactam (ZOSYN)  IV 3.375 g (08/21/18 1202)     LOS: 4 days   Phillips Climes MD [facility] Office  [phone] Pager - Text Page per Shea Evans  If 7PM-7AM, please contact night-coverage per Amion 08/21/2018, 4:11  PM

## 2018-08-21 NOTE — Progress Notes (Signed)
Patient back from dialysis. Alert and oriented x 4. No acute distress noted at this time, no complaints. VS stable. Will continue to monitor.

## 2018-08-21 NOTE — Consult Note (Signed)
Physical Medicine and Rehabilitation Consult Reason for [place] Referring Physician: Elgergawy   HPI: Christian Dalton is a 77 y.o. male with hx of ESRD, admitted on 08/17/18 with epigastric pain, diagnosed with acute cholecystitis. Pt underwent perc drain by IR on 08/19/18. Course complicated by sepsis and encephalopathy. Pt was evaluated by therapy and found to demonstrate poor activity tolerance and to require assistance with functional mobility. PM&R was asked to assess potential for rehab.    Review of Systems  Constitutional: Negative for chills.  HENT: Negative for tinnitus.   Eyes: Negative for double vision.  Respiratory: Positive for shortness of breath.   Cardiovascular: Negative for palpitations.  Gastrointestinal: Positive for abdominal pain.  Genitourinary: Negative for dysuria.  Musculoskeletal: Positive for myalgias.  Skin: Negative for itching.  Neurological: Negative for tremors.   Past Medical History:  Diagnosis Date  . CAD (coronary artery disease)   . COPD (chronic obstructive pulmonary disease) ([place])   . Diabetes mellitus without complication ([place])   . ESRD (end stage renal disease) on dialysis ([place])   . Hypertension   . OSA (obstructive sleep apnea)   . Renal disorder   . Spinal stenosis    Past Surgical History:  Procedure Laterality Date  . HAND SURGERY Right   . IR DIALY SHUNT INTRO NEEDLE/INTRACATH INITIAL W/IMG LEFT Left 08/20/2018  . IR PERC CHOLECYSTOSTOMY  08/19/2018  . SHOULDER SURGERY Right   . TIBIA FRACTURE SURGERY     History reviewed. No pertinent family history. Social History:  reports that he has never smoked. He has quit using smokeless tobacco. He reports that he drinks alcohol. He reports that he does not use drugs. Allergies:  Allergies  Allergen Reactions  . Avelox [Moxifloxacin Hcl] Other (See Comments)    unknown  . Sulfa Antibiotics Other (See Comments)    unknown  . Tetracyclines & Related Other (See  Comments)    unknown   Medications Prior to Admission  Medication Sig Dispense Refill  . aspirin EC 81 MG tablet Take 81 mg every evening by mouth.    Marland Kitchen atorvastatin (LIPITOR) 20 MG tablet Take 20 mg every evening by mouth.    Marland Kitchen buPROPion (WELLBUTRIN XL) 150 MG 24 hr tablet Take 150 mg daily by mouth.    . docusate sodium (COLACE) 100 MG capsule Take 100 mg 3 (three) times daily by mouth.    . escitalopram (LEXAPRO) 10 MG tablet Take 10 mg every evening by mouth.    Marland Kitchen HYDROcodone-acetaminophen (NORCO) 10-325 MG tablet Take 1 tablet every 8 (eight) hours as needed by mouth for pain.    Marland Kitchen insulin detemir (LEVEMIR) 100 UNIT/ML injection Inject 50 Units 2 (two) times daily into the skin.    Marland Kitchen insulin lispro (HUMALOG) 100 UNIT/ML injection Inject 10 Units 2 (two) times daily into the skin.    Marland Kitchen lactulose, encephalopathy, (CHRONULAC) 10 GM/15ML SOLN Take 15-30 mLs by mouth daily before supper.  4  . levothyroxine (SYNTHROID, LEVOTHROID) 125 MCG tablet Take 125 mcg daily by mouth.    . lidocaine-prilocaine (EMLA) cream Apply 1 application topically See admin instructions. To access site (AVF) 1-2 hours before dialysis. Cover with occlusive dressing (Saran warp)  4  . linaclotide (LINZESS) 145 MCG CAPS capsule Take 145 mcg daily after supper by mouth.    . loratadine (CLARITIN) 10 MG tablet Take 10 mg every evening by mouth.    . montelukast (SINGULAIR) 10 MG tablet Take 10 mg every evening by mouth.    Marland Kitchen  oxyCODONE-acetaminophen (PERCOCET) 5-325 MG tablet Take 1-2 tablets by mouth every 6 (six) hours as needed. (Patient taking differently: Take 1-2 tablets by mouth every 6 (six) hours as needed for moderate pain. ) 20 tablet 0  . pregabalin (LYRICA) 100 MG capsule Take 100 mg 2 (two) times daily by mouth.      Home: Home Living Family/patient expects to be discharged to:: Private residence Living Arrangements: Spouse/significant other(son lives in apt above ) Available Help at Discharge: Family,  Available 24 hours/day Type of Home: Apartment Home Access: Level entry Home Layout: One level Bathroom Shower/Tub: Multimedia programmer: Handicapped height Bathroom Accessibility: Yes Home Equipment: Environmental consultant - 2 wheels, Shower seat, Bedside commode, Cane - single point  Functional History: Prior Function Level of Independence: Needs assistance Gait / Transfers Assistance Needed: used RW and cane around house/to HD ADL's / Homemaking Assistance Needed: wife assisted with dressing, provided (S) during showers Functional Status:  Mobility: Bed Mobility Overal bed mobility: Needs Assistance Bed Mobility: Sidelying to Sit, Rolling Rolling: Min assist Sidelying to sit: Mod assist General bed mobility comments: cues for correct techniques and mechanics, ModA to power up to full sitting position  Transfers Overall transfer level: Needs assistance Equipment used: Rolling walker (2 wheeled) Transfers: Sit to/from Stand, [employer] Transfers Sit to Stand: Mod assist Stand pivot transfers: Mod assist General transfer comment: patient declined  Ambulation/Gait General Gait Details: patient declined     ADL: ADL Overall ADL's : Needs assistance/impaired Eating/Feeding: Supervision/ safety, Set up, Sitting Grooming: Wash/dry face, Set up, Sitting Upper Body Bathing: Set up, Supervision/ safety, Sitting Lower Body Bathing: Maximal assistance, Sit to/from stand Upper Body Dressing : Minimal assistance, Sitting Lower Body Dressing: Maximal assistance, Sit to/from stand Toilet Transfer: Moderate assistance, RW, BSC, Cueing for safety, Cueing for sequencing, Stand-pivot Toileting- Clothing Manipulation and Hygiene: Maximal assistance, Sit to/from stand  Cognition: Cognition Overall Cognitive Status: Within Functional Limits for tasks assessed Orientation Level: Oriented X4 Cognition Arousal/Alertness: Awake/alert Behavior During Therapy: WFL for tasks  assessed/performed Overall Cognitive Status: Within Functional Limits for tasks assessed  Blood pressure 130/70, pulse 84, temperature 98 F (36.7 C), temperature source Oral, resp. rate 19, height 5\' 10"  (1.778 m), weight 119.5 kg, SpO2 97 %. Physical Exam  Constitutional: No distress.  HENT:  Head: Normocephalic.  Eyes: Pupils are equal, round, and reactive to light.  Neck: Normal range of motion.  Cardiovascular: Normal rate.  Respiratory: Effort normal.  GI: He exhibits distension. There is tenderness.  Drain in place LUQ  Musculoskeletal: He exhibits no deformity.  Neurological: He is alert.  Oriented to self, hospital, month. Decreased insight and awareness. UE 4/5. LE 3-4/5 and limited by abdominal pain  Psychiatric:  Flat, disengaged    Results for orders placed or performed during the hospital encounter of 08/17/18 (from the past 24 hour(s))  Glucose, capillary     Status: Abnormal   Collection Time: 08/20/18 12:01 PM  Result Value Ref Range   Glucose-Capillary 267 (H) 70 - 99 mg/dL  Glucose, capillary     Status: Abnormal   Collection Time: 08/20/18  5:52 PM  Result Value Ref Range   Glucose-Capillary 256 (H) 70 - 99 mg/dL  Glucose, capillary     Status: Abnormal   Collection Time: 08/20/18 10:18 PM  Result Value Ref Range   Glucose-Capillary 189 (H) 70 - 99 mg/dL  Comprehensive metabolic panel     Status: Abnormal   Collection Time: 08/21/18  3:13 AM  Result Value  Ref Range   Sodium 132 (L) 135 - 145 mmol/L   Potassium 4.7 3.5 - 5.1 mmol/L   Chloride 88 (L) 98 - 111 mmol/L   CO2 22 22 - 32 mmol/L   Glucose, Bld 179 (H) 70 - 99 mg/dL   BUN 93 (H) 8 - 23 mg/dL   Creatinine, Ser 7.98 (H) 0.61 - 1.24 mg/dL   Calcium 9.5 8.9 - 10.3 mg/dL   Total Protein 6.5 6.5 - 8.1 g/dL   Albumin 2.4 (L) 3.5 - 5.0 g/dL   AST 23 15 - 41 U/L   ALT 115 (H) 0 - 44 U/L   Alkaline Phosphatase 284 (H) 38 - 126 U/L   Total Bilirubin 1.7 (H) 0.3 - 1.2 mg/dL   GFR calc non Af Amer 6  (L) >60 mL/min   GFR calc Af Amer 7 (L) >60 mL/min   Anion gap 22 (H) 5 - 15  CBC     Status: Abnormal   Collection Time: 08/21/18  3:13 AM  Result Value Ref Range   WBC 10.7 (H) 4.0 - 10.5 K/uL   RBC 4.93 4.22 - 5.81 MIL/uL   Hemoglobin 15.0 13.0 - 17.0 g/dL   HCT 45.4 39.0 - 52.0 %   MCV 92.1 78.0 - 100.0 fL   MCH 30.4 26.0 - 34.0 pg   MCHC 33.0 30.0 - 36.0 g/dL   RDW 15.6 (H) 11.5 - 15.5 %   Platelets 156 150 - 400 K/uL  Magnesium     Status: Abnormal   Collection Time: 08/21/18  3:13 AM  Result Value Ref Range   Magnesium 2.6 (H) 1.7 - 2.4 mg/dL  Phosphorus     Status: Abnormal   Collection Time: 08/21/18  3:13 AM  Result Value Ref Range   Phosphorus 9.7 (H) 2.5 - 4.6 mg/dL   Ir Perc Cholecystostomy  Result Date: 08/19/2018 INDICATION: 76 year old male with acute cholecystitis EXAM: IS IMAGE GUIDED PERCUTANEOUS CHOLECYSTOSTOMY MEDICATIONS: In house antibiotics ANESTHESIA/SEDATION: Moderate (conscious) sedation was employed during this procedure. A total of Versed 1.0 mg and Fentanyl 50 mcg was administered intravenously. Moderate Sedation Time: 10 minutes. The patient's level of consciousness and vital signs were monitored continuously by radiology nursing throughout the procedure under my direct supervision. FLUOROSCOPY TIME:  Fluoroscopy Time: 0 minutes 18 seconds (25 mGy). COMPLICATIONS: None PROCEDURE: Informed written consent was obtained from the patient and the patient's family after a thorough discussion of the procedural risks, benefits and alternatives. All questions were addressed. Maximal Sterile Barrier Technique was utilized including caps, mask, sterile gowns, sterile gloves, sterile drape, hand hygiene and skin antiseptic. A timeout was performed prior to the initiation of the procedure. Ultrasound survey of the right upper quadrant was performed for planning purposes. Once the patient is prepped and draped in the usual sterile fashion, the skin and subcutaneous tissues  overlying the gallbladder were generously infiltrated 1% lidocaine for local anesthesia. A coaxial needle was advanced under ultrasound guidance through the skin subcutaneous tissues and a small segment of liver into the gallbladder lumen. With removal of the stylet, spontaneous dark bile drainage occurred. Using modified Seldinger technique, a 10 French drain was placed into the gallbladder fossa, with aspiration of the sample for the lab. Contrast injection confirmed position of the tube within the gallbladder lumen. Drainage catheter was attached to gravity drain with a suture retention placed. Patient tolerated the procedure well and remained hemodynamically stable throughout. No complications were encountered and no significant blood loss encountered. IMPRESSION: Status  post image guided percutaneous cholecystostomy. Signed, Dulcy Fanny. Dellia Nims, RPVI [facility] Radiology Electronically Signed   By: Corrie Mckusick D.O.   On: 08/19/2018 16:23   Ir Dialy Shunt Intro Needle/intracath Initial W/img Left  Result Date: 08/20/2018 INDICATION: Malfunction EXAM: AV FISTULAGRAM MEDICATIONS: None. ANESTHESIA/SEDATION: Moderate Sedation Time: The patient was continuously monitored during the procedure by the interventional radiology nurse under my direct supervision. FLUOROSCOPY TIME:  Fluoroscopy Time:  minutes 12 seconds (28 mGy). COMPLICATIONS: None immediate. PROCEDURE: Informed written consent was obtained from the patient after a thorough discussion of the procedural risks, benefits and alternatives. All questions were addressed. Maximal Sterile Barrier Technique was utilized including caps, mask, sterile gowns, sterile gloves, sterile drape, hand hygiene and skin antiseptic. A timeout was performed prior to the initiation of the procedure. The left arm was prepped and draped in a sterile fashion. An 18 gauge Angiocath was inserted into the outflow vein. Contrast  was injected. The Angiocath was removed and hemostasis was achieved with direct pressure. FINDINGS: The arteriovenous anastomosis in the antecubital fossa, outflow cephalic vein, and central venous structures are widely patent. IMPRESSION: Left arm AV fistula circuit is patent. ACCESS: This access remains amenable to future percutaneous interventions as clinically indicated. Electronically Signed   By: Marybelle Killings M.D.   On: 08/20/2018 14:13    Assessment/Plan: Diagnosis: debility/encephalopathy related to cholecystis, sepsis 1. Does the need for close, 24 hr/day medical supervision in concert with the patient's rehab needs make it unreasonable for this patient to be served in a less intensive setting? Yes 2. Co-Morbidities requiring supervision/potential complications: ESRD on HD, anemia, DM, woundcare/drain issues 3. Due to bowel management, safety, skin/wound care, disease management, medication administration, pain management and patient education, does the patient require 24 hr/day rehab nursing? Yes 4. Does the patient require coordinated care of a physician, rehab nurse, PT (1-2 hrs/day, 5 days/week) and OT (1-2 hrs/day, 5 days/week) to address physical and functional deficits in the context of the above medical diagnosis(es)? Yes Addressing deficits in the following areas: balance, endurance, locomotion, strength, transferring, bowel/bladder control, bathing, dressing, feeding, grooming, toileting, cognition and psychosocial support 5. Can the patient actively participate in an intensive therapy program of at least 3 hrs of therapy per day at least 5 days per week? Yes and Potentially 6. The potential for patient to make measurable gains while on inpatient rehab is good 7. Anticipated functional outcomes upon discharge from inpatient rehab are modified independent and supervision  with PT, modified independent and supervision with OT, n/a with SLP. 8. Estimated rehab length of stay to reach the  above functional goals is: 7 days? 9. Anticipated D/C setting: Home 10. Anticipated post D/C treatments: [place] therapy 11. Overall Rehab/Functional Prognosis: good  RECOMMENDATIONS: This patient's condition is appropriate for continued rehabilitative care in the following setting: CIR Patient has agreed to participate in recommended program. Yes and Potentially Note that insurance prior authorization may be required for reimbursement for recommended care.  Comment: Abdominal pain and nausea appear to be main limiting factors affecting mobility. Cognitively is clearing. Rehab Admissions Coordinator to follow up.  Thanks,  Meredith Staggers, MD, Mellody Drown  I have personally performed a face to face diagnostic evaluation of this patient. Additionally, I have reviewed and concur with the physician assistant's documentation above.    Meredith Staggers, MD 08/21/2018

## 2018-08-21 NOTE — Progress Notes (Signed)
Patient with 8 runs of VTAC  With another 8 runs of VTAC earlier in the shift. Pt don't have Documented Cardiac hx. Provider notified

## 2018-08-21 NOTE — Progress Notes (Signed)
Patient ID: Christian Dalton, male   DOB: 03/14/1941, 77 y.o.   MRN: [id number]       Subjective: Pt still sleepy and doesn't carry on much of a conversation with me, but is somewhat improved from earlier in the week.  On HD.  States his pain isn't a ton better.  Tried to eat yesterday, but had no appetite.  Denies N/V  Objective: Vital signs in last 24 hours: Temp:  [98 F (36.7 C)-98.4 F (36.9 C)] 98 F (36.7 C) (09/28 0335) Pulse Rate:  [84-97] 84 (09/28 0335) Resp:  [17-21] 19 (09/28 0335) BP: (113-130)/(62-79) 130/70 (09/28 0335) SpO2:  [94 %-97 %] 97 % (09/28 0335) Last BM Date: 08/19/18  Intake/Output from previous day: 09/27 0701 - 09/28 0700 In: 151.4 [IV Piggyback:151.4] Out: 100 [Drains:100] Intake/Output this shift: No intake/output data recorded.  PE: Heart: regular Lungs: CTAB Abd: soft, less tender, but still some in RUQ near drain which is expected.  Perc chole drain with old bloody output.  +BS  Lab Results:  Recent Labs    08/20/18 0416 08/21/18 0313  WBC 14.6* 10.7*  HGB 16.0 15.0  HCT 48.8 45.4  PLT 169 156   BMET Recent Labs    08/20/18 0419 08/21/18 0313  NA 137 132*  K 5.6* 4.7  CL 95* 88*  CO2 23 22  GLUCOSE 280* 179*  BUN 63* 93*  CREATININE 6.82* 7.98*  CALCIUM 9.7 9.5   PT/INR Recent Labs    08/19/18 1231  LABPROT 15.3*  INR 1.22   CMP     Component Value Date/Time   NA 132 (L) 08/21/2018 0313   K 4.7 08/21/2018 0313   CL 88 (L) 08/21/2018 0313   CO2 22 08/21/2018 0313   GLUCOSE 179 (H) 08/21/2018 0313   BUN 93 (H) 08/21/2018 0313   CREATININE 7.98 (H) 08/21/2018 0313   CALCIUM 9.5 08/21/2018 0313   PROT 6.5 08/21/2018 0313   ALBUMIN 2.4 (L) 08/21/2018 0313   AST 23 08/21/2018 0313   ALT 115 (H) 08/21/2018 0313   ALKPHOS 284 (H) 08/21/2018 0313   BILITOT 1.7 (H) 08/21/2018 0313   GFRNONAA 6 (L) 08/21/2018 0313   GFRAA 7 (L) 08/21/2018 0313   Lipase     Component Value Date/Time   LIPASE 30 08/19/2018 0433        Studies/Results: Ir Perc Cholecystostomy  Result Date: 08/19/2018 INDICATION: 77 year old male with acute cholecystitis EXAM: IS IMAGE GUIDED PERCUTANEOUS CHOLECYSTOSTOMY MEDICATIONS: In house antibiotics ANESTHESIA/SEDATION: Moderate (conscious) sedation was employed during this procedure. A total of Versed 1.0 mg and Fentanyl 50 mcg was administered intravenously. Moderate Sedation Time: 10 minutes. The patient's level of consciousness and vital signs were monitored continuously by radiology nursing throughout the procedure under my direct supervision. FLUOROSCOPY TIME:  Fluoroscopy Time: 0 minutes 18 seconds (25 mGy). COMPLICATIONS: None PROCEDURE: Informed written consent was obtained from the patient and the patient's family after a thorough discussion of the procedural risks, benefits and alternatives. All questions were addressed. Maximal Sterile Barrier Technique was utilized including caps, mask, sterile gowns, sterile gloves, sterile drape, hand hygiene and skin antiseptic. A timeout was performed prior to the initiation of the procedure. Ultrasound survey of the right upper quadrant was performed for planning purposes. Once the patient is prepped and draped in the usual sterile fashion, the skin and subcutaneous tissues overlying the gallbladder were generously infiltrated 1% lidocaine for local anesthesia. A coaxial needle was advanced under ultrasound guidance through the skin subcutaneous  tissues and a small segment of liver into the gallbladder lumen. With removal of the stylet, spontaneous dark bile drainage occurred. Using modified Seldinger technique, a 10 French drain was placed into the gallbladder fossa, with aspiration of the sample for the lab. Contrast injection confirmed position of the tube within the gallbladder lumen. Drainage catheter was attached to gravity drain with a suture retention placed. Patient tolerated the procedure well and remained hemodynamically stable  throughout. No complications were encountered and no significant blood loss encountered. IMPRESSION: Status post image guided percutaneous cholecystostomy. Signed, Dulcy Fanny. Dellia Nims, RPVI Vascular and Interventional Radiology Specialists [facility] Radiology Electronically Signed   By: Corrie Mckusick D.O.   On: 08/19/2018 16:23   Ir Dialy Shunt Intro Needle/intracath Initial W/img Left  Result Date: 08/20/2018 INDICATION: Malfunction EXAM: AV FISTULAGRAM MEDICATIONS: None. ANESTHESIA/SEDATION: Moderate Sedation Time: The patient was continuously monitored during the procedure by the interventional radiology nurse under my direct supervision. FLUOROSCOPY TIME:  Fluoroscopy Time:  minutes 12 seconds (28 mGy). COMPLICATIONS: None immediate. PROCEDURE: Informed written consent was obtained from the patient after a thorough discussion of the procedural risks, benefits and alternatives. All questions were addressed. Maximal Sterile Barrier Technique was utilized including caps, mask, sterile gowns, sterile gloves, sterile drape, hand hygiene and skin antiseptic. A timeout was performed prior to the initiation of the procedure. The left arm was prepped and draped in a sterile fashion. An 18 gauge Angiocath was inserted into the outflow vein. Contrast was injected. The Angiocath was removed and hemostasis was achieved with direct pressure. FINDINGS: The arteriovenous anastomosis in the antecubital fossa, outflow cephalic vein, and central venous structures are widely patent. IMPRESSION: Left arm AV fistula circuit is patent. ACCESS: This access remains amenable to future percutaneous interventions as clinically indicated. Electronically Signed   By: Marybelle Killings M.D.   On: 08/20/2018 14:13    Anti-infectives: Anti-infectives (From admission, onward)   Start     Dose/Rate Route Frequency Ordered Stop   08/18/18 1000  piperacillin-tazobactam (ZOSYN) IVPB 4.5 g  Status:  Discontinued     4.5 g 200 mL/hr over 30  Minutes Intravenous Every 12 hours 08/17/18 2106 08/17/18 2133   08/17/18 2200  piperacillin-tazobactam (ZOSYN) IVPB 3.375 g     3.375 g 12.5 mL/hr over 240 Minutes Intravenous Every 12 hours 08/17/18 2134         Assessment/Plan Acute cholecystitis -s/p perc chole drain on 9/26. -slowing improving -cont abx therapy with IV for now, likely can transition to oral abx in the next day or so. -cont to encourage po intake -mobilize with PT who is recommending CIR  HTN First degree AV block CAD COPD ESRD on HD DM OSA AMS - improving, but still groggy   FEN - renal diet VTE -SCDs/heparin ID -zosyn 9/24 -->   LOS: 4 days    Henreitta Cea , [facility] Surgery 08/21/2018, 8:50 AM Pager: [phone]

## 2018-08-22 LAB — BASIC METABOLIC PANEL
Anion gap: 20 — ABNORMAL HIGH (ref 5–15)
BUN: 65 mg/dL — ABNORMAL HIGH (ref 8–23)
CO2: 21 mmol/L — ABNORMAL LOW (ref 22–32)
Calcium: 8.6 mg/dL — ABNORMAL LOW (ref 8.9–10.3)
Chloride: 91 mmol/L — ABNORMAL LOW (ref 98–111)
Creatinine, Ser: 6.27 mg/dL — ABNORMAL HIGH (ref 0.61–1.24)
GFR calc Af Amer: 9 mL/min — ABNORMAL LOW (ref >60)
GFR calc non Af Amer: 8 mL/min — ABNORMAL LOW (ref >60)
Glucose, Bld: 154 mg/dL — ABNORMAL HIGH (ref 70–99)
Potassium: 4.3 mmol/L (ref 3.5–5.1)
Sodium: 132 mmol/L — ABNORMAL LOW (ref 135–145)

## 2018-08-22 LAB — CBC
HCT: 49.6 % (ref 39.0–52.0)
Hemoglobin: 16.5 g/dL (ref 13.0–17.0)
MCH: 30.6 pg (ref 26.0–34.0)
MCHC: 33.3 g/dL (ref 30.0–36.0)
MCV: 91.9 fL (ref 78.0–100.0)
Platelets: DECREASED 10*3/uL (ref 150–400)
RBC: 5.4 MIL/uL (ref 4.22–5.81)
RDW: 15.5 % (ref 11.5–15.5)
WBC: 8.5 10*3/uL (ref 4.0–10.5)

## 2018-08-22 LAB — HEPATIC FUNCTION PANEL
ALT: 88 U/L — ABNORMAL HIGH (ref 0–44)
AST: 34 U/L (ref 15–41)
Albumin: 2.5 g/dL — ABNORMAL LOW (ref 3.5–5.0)
Alkaline Phosphatase: 295 U/L — ABNORMAL HIGH (ref 38–126)
Bilirubin, Direct: 0.5 mg/dL — ABNORMAL HIGH (ref 0.0–0.2)
Indirect Bilirubin: 1.3 mg/dL — ABNORMAL HIGH (ref 0.3–0.9)
Total Bilirubin: 1.8 mg/dL — ABNORMAL HIGH (ref 0.3–1.2)
Total Protein: 6.2 g/dL — ABNORMAL LOW (ref 6.5–8.1)

## 2018-08-22 LAB — GLUCOSE, CAPILLARY
Glucose-Capillary: 136 mg/dL — ABNORMAL HIGH (ref 70–99)
Glucose-Capillary: 182 mg/dL — ABNORMAL HIGH (ref 70–99)
Glucose-Capillary: 290 mg/dL — ABNORMAL HIGH (ref 70–99)
Glucose-Capillary: 248 mg/dL — ABNORMAL HIGH (ref 70–99)

## 2018-08-22 MED ORDER — SEVELAMER CARBONATE 800 MG PO TABS
2400.0000 mg | ORAL_TABLET | Freq: Three times a day (TID) | ORAL | Status: DC
  Administered 2018-08-22 – 2018-08-24 (×8): 2400 mg via ORAL
  Filled 2018-08-22 (×10): qty 3

## 2018-08-22 NOTE — Progress Notes (Signed)
[facility] Progress Note   Subjective: C/O abdominal pain/intermittent nausea.     Objective Vitals:   08/21/18 1115 08/21/18 1200 08/21/18 2108 08/22/18 0415  BP: 114/75 105/72 112/77 121/79  Pulse: (!) 102 99 94 98  Resp: (!) 22 14    Temp: 97.6 F (36.4 C) 98.4 F (36.9 C) 99 F (37.2 C) 98.3 F (36.8 C)  TempSrc: Oral Oral Oral Oral  SpO2: 100% 95% 94% 95%  Weight: 117.8 kg 118.1 kg    Height:       Physical Exam General: Obese elderly male in NAD Heart: S1,S2, No M/G/R. No JVD.  Lungs: CTAB Abdomen: obese, tender to palpation. RUQ perc. chole tube bloody drainage.  Extremities: No LE edema.  Dialysis Access: LUA AVF + bruit   Additional Objective Labs: Basic Metabolic Panel: Recent Labs  Lab 08/20/18 0419 08/21/18 0313 08/22/18 0350  NA 137 132* 132*  K 5.6* 4.7 4.3  CL 95* 88* 91*  CO2 23 22 21*  GLUCOSE 280* 179* 154*  BUN 63* 93* 65*  CREATININE 6.82* 7.98* 6.27*  CALCIUM 9.7 9.5 8.6*  PHOS 8.6* 9.7*  --    Liver Function Tests: Recent Labs  Lab 08/19/18 0433 08/20/18 0419 08/21/18 0313 08/22/18 0350  AST 91*  --  23 34  ALT 278*  --  115* 88*  ALKPHOS 330*  --  284* 295*  BILITOT 4.5*  --  1.7* 1.8*  PROT 6.4*  --  6.5 6.2*  ALBUMIN 2.7* 2.6* 2.4* 2.5*   Recent Labs  Lab 08/19/18 0433  LIPASE 30   CBC: Recent Labs  Lab 08/18/18 0605 08/19/18 0433 08/20/18 0416 08/21/18 0313 08/22/18 0350  WBC 18.4* 16.4* 14.6* 10.7* 8.5  HGB 15.9 15.6 16.0 15.0 16.5  HCT 49.0 48.0 48.8 45.4 49.6  MCV 96.3 95.0 94.6 92.1 91.9  PLT 156 148* 169 156 PLATELET CLUMPS NOTED ON SMEAR, COUNT APPEARS DECREASED   Blood Culture    Component Value Date/Time   SDES BILE 08/19/2018 1620   SPECREQUEST NONE 08/19/2018 1620   CULT  08/19/2018 1620    NO GROWTH 2 DAYS NO ANAEROBES ISOLATED; CULTURE IN PROGRESS FOR 5 DAYS Performed at [facility], [place] [address], [place], [place] [address]    REPTSTATUS PENDING 08/19/2018 1620     Cardiac Enzymes: No results for input(s): CKTOTAL, CKMB, CKMBINDEX, TROPONINI in the last 168 hours. CBG: Recent Labs  Lab 08/20/18 2218 08/21/18 1207 08/21/18 1719 08/21/18 2106 08/22/18 0833  GLUCAP 189* 131* 182* 174* 136*   Iron Studies: No results for input(s): IRON, TIBC, TRANSFERRIN, FERRITIN in the last 72 hours. @lablastinr3 @ Studies/Results: Ir Dialy Shunt Intro Needle/intracath Initial W/img Left  Result Date: 08/20/2018 INDICATION: Malfunction EXAM: AV FISTULAGRAM MEDICATIONS: None. ANESTHESIA/SEDATION: Moderate Sedation Time: The patient was continuously monitored during the procedure by the interventional radiology nurse under my direct supervision. FLUOROSCOPY TIME:  Fluoroscopy Time:  minutes 12 seconds (28 mGy). COMPLICATIONS: None immediate. PROCEDURE: Informed written consent was obtained from the patient after a thorough discussion of the procedural risks, benefits and alternatives. All questions were addressed. Maximal Sterile Barrier Technique was utilized including caps, mask, sterile gowns, sterile gloves, sterile drape, hand hygiene and skin antiseptic. A timeout was performed prior to the initiation of the procedure. The left arm was prepped and draped in a sterile fashion. An 18 gauge Angiocath was inserted into the outflow vein. Contrast was injected. The Angiocath was removed and hemostasis was achieved with direct pressure.  FINDINGS: The arteriovenous anastomosis in the antecubital fossa, outflow cephalic vein, and central venous structures are widely patent. IMPRESSION: Left arm AV fistula circuit is patent. ACCESS: This access remains amenable to future percutaneous interventions as clinically indicated. Electronically Signed   By: Marybelle Killings M.D.   On: 08/20/2018 14:13   Medications: . sodium chloride    . sodium chloride    . piperacillin-tazobactam (ZOSYN)  IV 3.375 g (08/21/18 2334)   . calcitRIOL  1 mcg Oral Q T,Th,Sa-HD  . Chlorhexidine Gluconate  Cloth  6 each Topical Q0600  . escitalopram  10 mg Oral QPM  . feeding supplement (PRO-STAT SUGAR FREE 64)  30 mL Oral BID  . ferric citrate  630 mg Oral TID WC  . heparin injection (subcutaneous)  5,000 Units Subcutaneous Q8H  . insulin aspart  0-9 Units Subcutaneous TID WC  . insulin detemir  10 Units Subcutaneous BID  . levothyroxine  125 mcg Oral QAC breakfast  . montelukast  10 mg Oral QPM  . multivitamin  1 tablet Oral QHS  . sodium chloride flush  5 mL Intracatheter Q8H     Dialysis Orders: [place] [facility] 4h 500/800 EDW 118kg 2K/2.25Ca L AVF Heparin bolus 7000 U Calcitriol 1.0 mcg TIW Auyrxia 3 tabs qactid  Assessment/Plan: 1 acute cholecystitis - s/p IR cholecystostomy/drain - on Zosyn, LFTs improving. Conservative treatment so far with perc chole drain. Per primary/Surgery.  2. ESRD - TTS -Next HD 08/24/2018  3. Anemia - hgb 16.5. Stop Ferric based binders, No ESA.  4. Secondary hyperparathyroidism - P 9.7, HGB high. Stop Auryxia. Start Renvela 2400 mg PO TID. Monitor tolerance. Check renal function panel with next HD.  5. BP/volume - HD tomorrow. Poor PO intake, no evidence of volume overload by exam. BP controlled. Last wt 118.1 kg 08/21/18.  6. Nutrition - Albumin 2.5. renal carb mod/renavite/prostat.  7. Hyperkalemia - Has rec'd V[place] since admit. Fistulagram done-negative. K+ 4.7 today. Uses 2.0 K bath at [place].  8. DM - per primary  Rita H. Brown NP-C 08/22/2018, 9:49 AM  [employer] [phone]

## 2018-08-22 NOTE — Progress Notes (Signed)
Referring Physician(s): Dr. Thereasa Solo  Supervising Physician: Marybelle Killings  Patient Status:  [facility] - In-pt  Chief Complaint: Acute cholecystitis  Subjective: Breakfast at bedside; not eaten. States he feeling somewhat better, but still sick.   Allergies: Avelox [moxifloxacin hcl]; Sulfa antibiotics; and Tetracyclines & related  Medications: Prior to Admission medications   Medication Sig Start Date End Date Taking? Authorizing Provider  aspirin EC 81 MG tablet Take 81 mg every evening by mouth.   Yes [provider]  atorvastatin (LIPITOR) 20 MG tablet Take 20 mg every evening by mouth. 01/29/15  Yes [provider]  buPROPion (WELLBUTRIN XL) 150 MG 24 hr tablet Take 150 mg daily by mouth.   Yes [provider]  docusate sodium (COLACE) 100 MG capsule Take 100 mg 3 (three) times daily by mouth.   Yes [provider]  escitalopram (LEXAPRO) 10 MG tablet Take 10 mg every evening by mouth. 08/30/15  Yes [provider]  HYDROcodone-acetaminophen (NORCO) 10-325 MG tablet Take 1 tablet every 8 (eight) hours as needed by mouth for pain. 01/16/17  Yes [provider]  insulin detemir (LEVEMIR) 100 UNIT/ML injection Inject 50 Units 2 (two) times daily into the skin.   Yes [provider]  insulin lispro (HUMALOG) 100 UNIT/ML injection Inject 10 Units 2 (two) times daily into the skin.   Yes [provider]  lactulose, encephalopathy, (CHRONULAC) 10 GM/15ML SOLN Take 15-30 mLs by mouth daily before supper. 08/02/18  Yes [provider]  levothyroxine (SYNTHROID, LEVOTHROID) 125 MCG tablet Take 125 mcg daily by mouth.   Yes [provider]  lidocaine-prilocaine (EMLA) cream Apply 1 application topically See admin instructions. To access site (AVF) 1-2 hours before dialysis. Cover with occlusive dressing (Saran warp) 08/02/18  Yes [provider]  linaclotide (LINZESS) 145 MCG CAPS capsule Take 145 mcg  daily after supper by mouth.   Yes [provider]  loratadine (CLARITIN) 10 MG tablet Take 10 mg every evening by mouth.   Yes [provider]  montelukast (SINGULAIR) 10 MG tablet Take 10 mg every evening by mouth.   Yes [provider]  oxyCODONE-acetaminophen (PERCOCET) 5-325 MG tablet Take 1-2 tablets by mouth every 6 (six) hours as needed. Patient taking differently: Take 1-2 tablets by mouth every 6 (six) hours as needed for moderate pain.  05/16/16  Yes Delo, Nathaneil Canary, MD  pregabalin (LYRICA) 100 MG capsule Take 100 mg 2 (two) times daily by mouth. 07/23/15  Yes [provider]     Vital Signs: BP 121/79 (BP Location: Right Arm)   Pulse 98   Temp 98.3 F (36.8 C) (Oral)   Resp 14   Ht 5\' 10"  (1.778 m)   Wt 260 lb 5.8 oz (118.1 kg)   SpO2 95%   BMI 37.36 kg/m   Physical Exam  NAD, alert, lying flat in bed Abdomen: Soft, tender.  Bruising at drain insertion site.  Bloody, bilious output in collection bag.   Imaging: Mr Abdomen Mrcp Wo Contrast  Result Date: 08/18/2018 CLINICAL DATA:  Elevated liver function tests. Cholelithiasis. Findings suggestive of acute cholecystitis on recent CT and sonography studies. End-stage renal disease on hemodialysis. Abdominal pain. EXAM: MRI ABDOMEN WITHOUT CONTRAST  (INCLUDING MRCP) TECHNIQUE: Multiplanar multisequence MR imaging of the abdomen was performed. Heavily T2-weighted images of the biliary and pancreatic ducts were obtained, and three-dimensional MRCP images were rendered by post processing. COMPARISON:  08/17/2018 abdominal sonogram and CT abdomen/pelvis. FINDINGS: Lower chest: Hypoventilatory changes  at the dependent lung bases. Hepatobiliary: Normal liver size and configuration. No hepatic steatosis. No liver mass. Mildly distended gallbladder. There is a 1.7 cm gallstone in the gallbladder neck. There is layering sludge in the gallbladder. There is new gas in the nondependent fundal gallbladder (series  3/image 25). Moderate diffuse gallbladder wall thickening with trace pericholecystic fluid. No biliary ductal dilatation. Common bile duct diameter 2 mm. No choledocholithiasis. Pancreas: No pancreatic duct dilation. No pancreas divisum. There are several lobulated unilocular small cystic pancreatic lesions scattered throughout the pancreas, largest 1.1 cm in the pancreatic body (series 10/image 60), without appreciable wall thickening or internal complexity. Spleen: Normal size. No mass. Adrenals/Urinary Tract: Normal right adrenal. Left adrenal 1.4 cm nodule with significant loss of signal intensity on out of phase chemical shift imaging, diagnostic of an adenoma. No hydronephrosis. Symmetric renal atrophy. Numerous bilateral renal cysts, largest 2.8 cm in the posterior lower right kidney, several which demonstrate hemorrhagic/proteinaceous signal intensity on T1 imaging, incompletely characterized on this noncontrast scan. Stomach/Bowel: Normal non-distended stomach. Visualized small and large bowel is normal caliber, with no bowel wall thickening. Vascular/Lymphatic: Nonaneurysmal abdominal aorta. No pathologically enlarged lymph nodes in the abdomen. Other: No abdominal ascites or focal fluid collection. Musculoskeletal: No aggressive appearing focal osseous lesions. IMPRESSION: 1. Cholelithiasis with 1.7 cm gallstone in the gallbladder neck. Moderate diffuse gallbladder wall thickening with trace pericholecystic fluid. New gas in nondependent fundal gallbladder. Findings are compatible with acute emphysematous cholecystitis. 2. No biliary ductal dilatation. CBD diameter 2 mm. No choledocholithiasis. 3. 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 Follow-up MRI abdomen recommended in 2 years. This recommendation follows ACR consensus guidelines: Management of Incidental Pancreatic Cysts: A White Paper of the ACR Incidental Findings Committee. J Am Coll Radiol 6295;28:413-244. 4.  Atrophic kidneys with numerous incompletely characterized renal cysts, several of which are hemorrhagic/proteinaceous. Suggest attention on follow-up MRI abdomen in 6 months. 5. Left adrenal adenoma. Electronically Signed   By: Ilona Sorrel M.D.   On: 08/18/2018 23:59   Mr 3d Recon At Scanner  Result Date: 08/18/2018 CLINICAL DATA:  Elevated liver function tests. Cholelithiasis. Findings suggestive of acute cholecystitis on recent CT and sonography studies. End-stage renal disease on hemodialysis. Abdominal pain. EXAM: MRI ABDOMEN WITHOUT CONTRAST  (INCLUDING MRCP) TECHNIQUE: Multiplanar multisequence MR imaging of the abdomen was performed. Heavily T2-weighted images of the biliary and pancreatic ducts were obtained, and three-dimensional MRCP images were rendered by post processing. COMPARISON:  08/17/2018 abdominal sonogram and CT abdomen/pelvis. FINDINGS: Lower chest: Hypoventilatory changes at the dependent lung bases. Hepatobiliary: Normal liver size and configuration. No hepatic steatosis. No liver mass. Mildly distended gallbladder. There is a 1.7 cm gallstone in the gallbladder neck. There is layering sludge in the gallbladder. There is new gas in the nondependent fundal gallbladder (series 3/image 25). Moderate diffuse gallbladder wall thickening with trace pericholecystic fluid. No biliary ductal dilatation. Common bile duct diameter 2 mm. No choledocholithiasis. Pancreas: No pancreatic duct dilation. No pancreas divisum. There are several lobulated unilocular small cystic pancreatic lesions scattered throughout the pancreas, largest 1.1 cm in the pancreatic body (series 10/image 60), without appreciable wall thickening or internal complexity. Spleen: Normal size. No mass. Adrenals/Urinary Tract: Normal right adrenal. Left adrenal 1.4 cm nodule with significant loss of signal intensity on out of phase chemical shift imaging, diagnostic of an adenoma. No hydronephrosis. Symmetric renal atrophy.  Numerous bilateral renal cysts, largest 2.8 cm in the posterior lower right kidney, several which demonstrate hemorrhagic/proteinaceous signal intensity on  T1 imaging, incompletely characterized on this noncontrast scan. Stomach/Bowel: Normal non-distended stomach. Visualized small and large bowel is normal caliber, with no bowel wall thickening. Vascular/Lymphatic: Nonaneurysmal abdominal aorta. No pathologically enlarged lymph nodes in the abdomen. Other: No abdominal ascites or focal fluid collection. Musculoskeletal: No aggressive appearing focal osseous lesions. IMPRESSION: 1. Cholelithiasis with 1.7 cm gallstone in the gallbladder neck. Moderate diffuse gallbladder wall thickening with trace pericholecystic fluid. New gas in nondependent fundal gallbladder. Findings are compatible with acute emphysematous cholecystitis. 2. No biliary ductal dilatation. CBD diameter 2 mm. No choledocholithiasis. 3. 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 Follow-up MRI abdomen recommended in 2 years. This recommendation follows ACR consensus guidelines: Management of Incidental Pancreatic Cysts: A White Paper of the ACR Incidental Findings Committee. J Am Coll Radiol 5409;81:191-478. 4. Atrophic kidneys with numerous incompletely characterized renal cysts, several of which are hemorrhagic/proteinaceous. Suggest attention on follow-up MRI abdomen in 6 months. 5. Left adrenal adenoma. Electronically Signed   By: Ilona Sorrel M.D.   On: 08/18/2018 23:59   Ir Perc Cholecystostomy  Result Date: 08/19/2018 INDICATION: 77 year old male with acute cholecystitis EXAM: IS IMAGE GUIDED PERCUTANEOUS CHOLECYSTOSTOMY MEDICATIONS: In house antibiotics ANESTHESIA/SEDATION: Moderate (conscious) sedation was employed during this procedure. A total of Versed 1.0 mg and Fentanyl 50 mcg was administered intravenously. Moderate Sedation Time: 10 minutes. The patient's level of consciousness and vital signs were  monitored continuously by radiology nursing throughout the procedure under my direct supervision. FLUOROSCOPY TIME:  Fluoroscopy Time: 0 minutes 18 seconds (25 mGy). COMPLICATIONS: None PROCEDURE: Informed written consent was obtained from the patient and the patient's family after a thorough discussion of the procedural risks, benefits and alternatives. All questions were addressed. Maximal Sterile Barrier Technique was utilized including caps, mask, sterile gowns, sterile gloves, sterile drape, hand hygiene and skin antiseptic. A timeout was performed prior to the initiation of the procedure. Ultrasound survey of the right upper quadrant was performed for planning purposes. Once the patient is prepped and draped in the usual sterile fashion, the skin and subcutaneous tissues overlying the gallbladder were generously infiltrated 1% lidocaine for local anesthesia. A coaxial needle was advanced under ultrasound guidance through the skin subcutaneous tissues and a small segment of liver into the gallbladder lumen. With removal of the stylet, spontaneous dark bile drainage occurred. Using modified Seldinger technique, a 10 French drain was placed into the gallbladder fossa, with aspiration of the sample for the lab. Contrast injection confirmed position of the tube within the gallbladder lumen. Drainage catheter was attached to gravity drain with a suture retention placed. Patient tolerated the procedure well and remained hemodynamically stable throughout. No complications were encountered and no significant blood loss encountered. IMPRESSION: Status post image guided percutaneous cholecystostomy. Signed, Dulcy Fanny. Dellia Nims, RPVI [facility] Rad[facility] Electronically Signed   By: Corrie Mckusick D.O.   On: 08/19/2018 16:23   Ir Dialy Shunt Intro Needle/intracath Initial W/img Left  Result Date: 08/20/2018 INDICATION: Malfunction EXAM: AV FISTULAGRAM MEDICATIONS: None.  ANESTHESIA/SEDATION: Moderate Sedation Time: The patient was continuously monitored during the procedure by the interventional radiology nurse under my direct supervision. FLUOROSCOPY TIME:  Fluoroscopy Time:  minutes 12 seconds (28 mGy). COMPLICATIONS: None immediate. PROCEDURE: Informed written consent was obtained from the patient after a thorough discussion of the procedural risks, benefits and alternatives. All questions were addressed. Maximal Sterile Barrier Technique was utilized including caps, mask, sterile gowns, sterile gloves, sterile drape, hand hygiene and skin antiseptic. A timeout  was performed prior to the initiation of the procedure. The left arm was prepped and draped in a sterile fashion. An 18 gauge Angiocath was inserted into the outflow vein. Contrast was injected. The Angiocath was removed and hemostasis was achieved with direct pressure. FINDINGS: The arteriovenous anastomosis in the antecubital fossa, outflow cephalic vein, and central venous structures are widely patent. IMPRESSION: Left arm AV fistula circuit is patent. ACCESS: This access remains amenable to future percutaneous interventions as clinically indicated. Electronically Signed   By: Marybelle Killings M.D.   On: 08/20/2018 14:13    Labs:  CBC: Recent Labs    08/19/18 0433 08/20/18 0416 08/21/18 0313 08/22/18 0350  WBC 16.4* 14.6* 10.7* 8.5  HGB 15.6 16.0 15.0 16.5  HCT 48.0 48.8 45.4 49.6  PLT 148* 169 156 PLATELET CLUMPS NOTED ON SMEAR, COUNT APPEARS DECREASED    COAGS: Recent Labs    08/19/18 1231  INR 1.22    BMP: Recent Labs    08/19/18 0433 08/20/18 0419 08/21/18 0313 08/22/18 0350  NA 137 137 132* 132*  K 5.7* 5.6* 4.7 4.3  CL 94* 95* 88* 91*  CO2 25 23 22  21*  GLUCOSE 269* 280* 179* 154*  BUN 67* 63* 93* 65*  CALCIUM 9.5 9.7 9.5 8.6*  CREATININE 9.13* 6.82* 7.98* 6.27*  GFRNONAA 5* 7* 6* 8*  GFRAA 6* 8* 7* 9*    LIVER FUNCTION TESTS: Recent Labs    08/18/18 1423 08/19/18 0433  08/20/18 0419 08/21/18 0313 08/22/18 0350  BILITOT 5.0* 4.5*  --  1.7* 1.8*  AST 206* 91*  --  23 34  ALT 416* 278*  --  115* 88*  ALKPHOS 332* 330*  --  284* 295*  PROT 6.3* 6.4*  --  6.5 6.2*  ALBUMIN 2.9* 2.7* 2.6* 2.4* 2.5*    Assessment and Plan: Acute cholecystits s/p drain placement 08/19/18 Drain remains in place.  Insertion site intact.  Bloody output in collection bag.  Remains on IV Zosyn WBC 8.5 this AM Continue current management.   Electronically Signed: Docia Barrier, PA 08/22/2018, 10:58 AM   I spent a total of 15 Minutes at the the patient's bedside AND on the patient's hospital floor or unit, greater than 50% of which was counseling/coordinating care for acute cholecystitis.

## 2018-08-22 NOTE — Progress Notes (Signed)
Christian Dalton  BBC:[id number] DOB: 1941-10-23 DOA: 08/17/2018 PCP: Emmaline Kluver, MD    Brief Narrative:  77 y.o. male w/ a hx of ESRD on hemodialysis, diabetes, and hypertension who presented with epigastric pain. An US showed acute cholecystitis.  Subjective: Complains of abdominal pain, but they are controlled with current oral regimen, reports bowel movement yesterday, no nausea or vomiting  Assessment & Plan:  Sepsis due to acute cholecystitis -Sepsis criteria met on admission as had leukocytosis, tachycardia with altered mental status -Significant for acute cholecystitis, general surgery input greatly appreciated, MRI was significant for 1.7 gallstone in gallbladder neck no common bile duct dilation, patient was high risk for surgery, so went for  percutaneous drain per IR 08/19/2018  -Leukocytosis trending down, and judgment for general surgery, continue with IV Zosyn for now, hopefully can be transitioned to oral regimen soon -Having some pain at the insertion site, but it is improving, related by IR today, drain was appropriately  Acute encephalopathy multifactorial in setting of narcotic use, end-stage renal disease, and sepsis - mental status significantly improved, back to baseline  1.1cm Cystic lesion in pancreatic body  Will need f/u MRI in outpt setting (formal rec is in 2 years)   ESRD on dialysis Care per Nephrology  DM2  CBG trending upward - diet being advanced - adjust tx and follow   Obesity, Class III, BMI 40-49.9 (morbid obesity)  Hyperkalemia Nephrology is addressing   DVT prophylaxis: SQ heparin  Code Status: FULL CODE Family Communication: None at bedside Disposition Plan: CIR consulted  Consultants:  Gen Surgery Nephrology  GI IR  Antimicrobials:  Zosyn 9/24 >  Objective: Blood pressure 121/79, pulse 98, temperature 98.3 F (36.8 C), temperature source Oral, resp. rate 14, height '5\' 10"'  (1.778 m), weight 118.1 kg, SpO2 95  %.  Intake/Output Summary (Last 24 hours) at 08/22/2018 1228 Last data filed at 08/22/2018 1012 Gross per 24 hour  Intake 355 ml  Output 250 ml  Net 105 ml   Filed Weights   08/21/18 0700 08/21/18 1115 08/21/18 1200  Weight: 119.7 kg 117.8 kg 118.1 kg    Examination:  Awake Alert, Oriented X 3, No new F.N deficits, Normal affect Symmetrical Chest wall movement, Good air movement bilaterally, CTAB RRR,No Gallops,Rubs or new Murmurs, No Parasternal Heave +ve B.Sounds, Abd Soft, upper quadrant drain with black fluid, has some mild tenderness around insertion site  no Cyanosis, Clubbing or edema, No new Rash or bruise     CBC: Recent Labs  Lab 08/20/18 0416 08/21/18 0313 08/22/18 0350  WBC 14.6* 10.7* 8.5  HGB 16.0 15.0 16.5  HCT 48.8 45.4 49.6  MCV 94.6 92.1 91.9  PLT 169 156 PLATELET CLUMPS NOTED ON SMEAR, COUNT APPEARS DECREASED   Basic Metabolic Panel: Recent Labs  Lab 08/20/18 0419 08/21/18 0313 08/22/18 0350  NA 137 132* 132*  K 5.6* 4.7 4.3  CL 95* 88* 91*  CO2 23 22 21*  GLUCOSE 280* 179* 154*  BUN 63* 93* 65*  CREATININE 6.82* 7.98* 6.27*  CALCIUM 9.7 9.5 8.6*  MG  --  2.6*  --   PHOS 8.6* 9.7*  --    GFR: Estimated Creatinine Clearance: 13.1 mL/min (A) (by C-G formula based on SCr of 6.27 mg/dL (H)).  Liver Function Tests: Recent Labs  Lab 08/18/18 1423 08/19/18 0433 08/20/18 0419 08/21/18 0313 08/22/18 0350  AST 206* 91*  --  23 34  ALT 416* 278*  --  115* 88*  ALKPHOS  332* 330*  --  284* 295*  BILITOT 5.0* 4.5*  --  1.7* 1.8*  PROT 6.3* 6.4*  --  6.5 6.2*  ALBUMIN 2.9* 2.7* 2.6* 2.4* 2.5*   Recent Labs  Lab 08/19/18 0433  LIPASE 30   Recent Labs  Lab 08/20/18 0419  AMMONIA 26    Coagulation Profile: Recent Labs  Lab 08/19/18 1231  INR 1.22   HbA1C: Hgb A1c MFr Bld  Date/Time Value Ref Range Status  08/17/2018 07:18 PM 9.7 (H) 4.8 - 5.6 % Final    Comment:    (NOTE) Pre diabetes:          5.7%-6.4% Diabetes:               >6.4% Glycemic control for   <7.0% adults with diabetes     CBG: Recent Labs  Lab 08/20/18 2218 08/21/18 1207 08/21/18 1719 08/21/18 2106 08/22/18 0833  GLUCAP 189* 131* 182* 174* 136*    Recent Results (from the past 240 hour(s))  Surgical pcr screen     Status: None   Collection Time: 08/17/18  8:59 PM  Result Value Ref Range Status   MRSA, PCR NEGATIVE NEGATIVE Final   Staphylococcus aureus NEGATIVE NEGATIVE Final    Comment: (NOTE) The Xpert SA Assay (FDA approved for NASAL specimens in patients 23 years of age and older), is one component of a comprehensive surveillance program. It is not intended to diagnose infection nor to guide or monitor treatment. Performed at [facility], [place] [address] [place], [place] [address]   Aerobic/Anaerobic Culture (surgical/deep wound)     Status: None (Preliminary result)   Collection Time: 08/19/18  4:20 PM  Result Value Ref Range Status   Specimen Description BILE  Final   Special Requests NONE  Final   Gram Stain   Final    WBC PRESENT, PREDOMINANTLY MONONUCLEAR NO ORGANISMS SEEN    Culture   Final    NO GROWTH 3 DAYS NO ANAEROBES ISOLATED; CULTURE IN PROGRESS FOR 5 DAYS Performed at [facility], [place] [address], [place],  [address]    Report Status PENDING  Incomplete     Scheduled Meds: . calcitRIOL  1 mcg Oral Q T,Th,Sa-HD  . Chlorhexidine Gluconate Cloth  6 each Topical Q0600  . escitalopram  10 mg Oral QPM  . feeding supplement (PRO-STAT SUGAR FREE 64)  30 mL Oral BID  . heparin injection (subcutaneous)  5,000 Units Subcutaneous Q8H  . insulin aspart  0-9 Units Subcutaneous TID WC  . insulin detemir  10 Units Subcutaneous BID  . levothyroxine  125 mcg Oral QAC breakfast  . montelukast  10 mg Oral QPM  . multivitamin  1 tablet Oral QHS  . sevelamer carbonate  2,400 mg Oral TID WC  . sodium chloride flush  5 mL Intracatheter Q8H   Continuous Infusions: . sodium chloride    . sodium  chloride    . piperacillin-tazobactam (ZOSYN)  IV 3.375 g (08/22/18 1011)     LOS: 5 days   Phillips Climes MD [facility] Office  [phone] Pager - Text Page per Shea Evans  If 7PM-7AM, please contact night-coverage per Amion 08/22/2018, 12:28 PM

## 2018-08-22 NOTE — Progress Notes (Signed)
Patient ID: Christian Dalton, male   DOB: 09/16/1941, 77 y.o.   MRN: [id number]       Subjective: Pt sleeping.  Woke him up and he states that he still has abdominal pain.  Admits to nausea, not eating much, but then is wanting to know where breakfast is and wanting a waffle.  Objective: Vital signs in last 24 hours: Temp:  [97.6 F (36.4 C)-99 F (37.2 C)] 98.3 F (36.8 C) (09/29 0415) Pulse Rate:  [94-103] 98 (09/29 0415) Resp:  [14-22] 14 (09/28 1200) BP: (96-123)/(66-80) 121/79 (09/29 0415) SpO2:  [94 %-100 %] 95 % (09/29 0415) Weight:  [117.8 kg-118.1 kg] 118.1 kg (09/28 1200) Last BM Date: 08/21/18  Intake/Output from previous day: 09/28 0701 - 09/29 0700 In: 365 [P.O.:300; IV Piggyback:50] Out: 3500 [Urine:150; Drains:100] Intake/Output this shift: No intake/output data recorded.  PE: Heart: regular Lungs: CTAB Abd: soft, still tender in RUQ, drain with bloody bilious output, 100cc documented yesterday, obese, +BS  Lab Results:  Recent Labs    08/21/18 0313 08/22/18 0350  WBC 10.7* 8.5  HGB 15.0 16.5  HCT 45.4 49.6  PLT 156 PLATELET CLUMPS NOTED ON SMEAR, COUNT APPEARS DECREASED   BMET Recent Labs    08/21/18 0313 08/22/18 0350  NA 132* 132*  K 4.7 4.3  CL 88* 91*  CO2 22 21*  GLUCOSE 179* 154*  BUN 93* 65*  CREATININE 7.98* 6.27*  CALCIUM 9.5 8.6*   PT/INR Recent Labs    08/19/18 1231  LABPROT 15.3*  INR 1.22   CMP     Component Value Date/Time   NA 132 (L) 08/22/2018 0350   K 4.3 08/22/2018 0350   CL 91 (L) 08/22/2018 0350   CO2 21 (L) 08/22/2018 0350   GLUCOSE 154 (H) 08/22/2018 0350   BUN 65 (H) 08/22/2018 0350   CREATININE 6.27 (H) 08/22/2018 0350   CALCIUM 8.6 (L) 08/22/2018 0350   PROT 6.2 (L) 08/22/2018 0350   ALBUMIN 2.5 (L) 08/22/2018 0350   AST 34 08/22/2018 0350   ALT 88 (H) 08/22/2018 0350   ALKPHOS 295 (H) 08/22/2018 0350   BILITOT 1.8 (H) 08/22/2018 0350   GFRNONAA 8 (L) 08/22/2018 0350   GFRAA 9 (L) 08/22/2018 0350    Lipase     Component Value Date/Time   LIPASE 30 08/19/2018 0433       Studies/Results: Ir Dialy Shunt Intro Needle/intracath Initial W/img Left  Result Date: 08/20/2018 INDICATION: Malfunction EXAM: AV FISTULAGRAM MEDICATIONS: None. ANESTHESIA/SEDATION: Moderate Sedation Time: The patient was continuously monitored during the procedure by the interventional radiology nurse under my direct supervision. FLUOROSCOPY TIME:  Fluoroscopy Time:  minutes 12 seconds (28 mGy). COMPLICATIONS: None immediate. PROCEDURE: Informed written consent was obtained from the patient after a thorough discussion of the procedural risks, benefits and alternatives. All questions were addressed. Maximal Sterile Barrier Technique was utilized including caps, mask, sterile gowns, sterile gloves, sterile drape, hand hygiene and skin antiseptic. A timeout was performed prior to the initiation of the procedure. The left arm was prepped and draped in a sterile fashion. An 18 gauge Angiocath was inserted into the outflow vein. Contrast was injected. The Angiocath was removed and hemostasis was achieved with direct pressure. FINDINGS: The arteriovenous anastomosis in the antecubital fossa, outflow cephalic vein, and central venous structures are widely patent. IMPRESSION: Left arm AV fistula circuit is patent. ACCESS: This access remains amenable to future percutaneous interventions as clinically indicated. Electronically Signed   By: Rodena Goldmann.D.  On: 08/20/2018 14:13    Anti-infectives: Anti-infectives (From admission, onward)   Start     Dose/Rate Route Frequency Ordered Stop   08/18/18 1000  piperacillin-tazobactam (ZOSYN) IVPB 4.5 g  Status:  Discontinued     4.5 g 200 mL/hr over 30 Minutes Intravenous Every 12 hours 08/17/18 2106 08/17/18 2133   08/17/18 2200  piperacillin-tazobactam (ZOSYN) IVPB 3.375 g     3.375 g 12.5 mL/hr over 240 Minutes Intravenous Every 12 hours 08/17/18 2134          Assessment/Plan Acute cholecystitis -s/p perc chole drain on 9/26. -slowing improving -cont abx therapy with IV for now, likely can transition to oral abx in the next day or so. -cont to encourage po intake.  Unclear why he has such bad nausea currently, although seems improved upon my exam. -mobilize with PT who is recommending CIR  HTN First degree AV block CAD COPD ESRD on HD DM OSA AMS - improving, but still groggy   FEN -renal diet VTE -SCDs/heparin ID -zosyn 9/24 -->   LOS: 5 days    Henreitta Cea , [facility] 08/22/2018, 8:45 AM Pager: [phone]

## 2018-08-22 NOTE — Progress Notes (Signed)
Patient with episode of intractable nausea not controlled by PRN Zofran. Phenergan  12.5mg  one time order given with relief. Pain addressed with Oxycodone 5mg  x 2

## 2018-08-23 LAB — CBC
HCT: 43.4 % (ref 39.0–52.0)
Hemoglobin: 14.3 g/dL (ref 13.0–17.0)
MCH: 31 pg (ref 26.0–34.0)
MCHC: 32.9 g/dL (ref 30.0–36.0)
MCV: 94.1 fL (ref 78.0–100.0)
Platelets: 199 10*3/uL (ref 150–400)
RBC: 4.61 MIL/uL (ref 4.22–5.81)
RDW: 15.2 % (ref 11.5–15.5)
WBC: 8.3 10*3/uL (ref 4.0–10.5)

## 2018-08-23 LAB — GLUCOSE, CAPILLARY
Glucose-Capillary: 175 mg/dL — ABNORMAL HIGH (ref 70–99)
Glucose-Capillary: 235 mg/dL — ABNORMAL HIGH (ref 70–99)
Glucose-Capillary: 179 mg/dL — ABNORMAL HIGH (ref 70–99)
Glucose-Capillary: 211 mg/dL — ABNORMAL HIGH (ref 70–99)

## 2018-08-23 LAB — BASIC METABOLIC PANEL
Anion gap: 19 — ABNORMAL HIGH (ref 5–15)
BUN: 93 mg/dL — ABNORMAL HIGH (ref 8–23)
CO2: 21 mmol/L — ABNORMAL LOW (ref 22–32)
Calcium: 8.7 mg/dL — ABNORMAL LOW (ref 8.9–10.3)
Chloride: 90 mmol/L — ABNORMAL LOW (ref 98–111)
Creatinine, Ser: 7.85 mg/dL — ABNORMAL HIGH (ref 0.61–1.24)
GFR calc Af Amer: 7 mL/min — ABNORMAL LOW (ref >60)
GFR calc non Af Amer: 6 mL/min — ABNORMAL LOW (ref >60)
Glucose, Bld: 187 mg/dL — ABNORMAL HIGH (ref 70–99)
Potassium: 4 mmol/L (ref 3.5–5.1)
Sodium: 130 mmol/L — ABNORMAL LOW (ref 135–145)

## 2018-08-23 MED ORDER — AMOXICILLIN-POT CLAVULANATE 500-125 MG PO TABS
1.0000 | ORAL_TABLET | Freq: Every day | ORAL | Status: DC
  Administered 2018-08-24: 500 mg via ORAL
  Filled 2018-08-23 (×2): qty 1

## 2018-08-23 MED ORDER — POLYVINYL ALCOHOL 1.4 % OP SOLN
1.0000 [drp] | OPHTHALMIC | Status: DC | PRN
  Administered 2018-08-23: 1 [drp] via OPHTHALMIC
  Filled 2018-08-23: qty 15

## 2018-08-23 MED ORDER — AMOXICILLIN-POT CLAVULANATE 875-125 MG PO TABS
1.0000 | ORAL_TABLET | Freq: Two times a day (BID) | ORAL | Status: DC
  Administered 2018-08-23: 1 via ORAL
  Filled 2018-08-23: qty 1

## 2018-08-23 NOTE — Progress Notes (Addendum)
Patient ID: Christian Dalton, male   DOB: Feb 18, 1941, 77 y.o.   MRN: [id number]       Subjective: Still says he doesn't feel good.  Always seems to be laying in bed.  Ate yesterday, but still says he has nausea.  Objective: Vital signs in last 24 hours: Temp:  [98 F (36.7 C)-98.7 F (37.1 C)] 98.7 F (37.1 C) (09/30 0513) Pulse Rate:  [81-87] 81 (09/30 0513) Resp:  [18] 18 (09/29 1545) BP: (120-133)/(66-74) 133/74 (09/30 0513) SpO2:  [95 %-97 %] 97 % (09/30 0513) Last BM Date: 08/22/18  Intake/Output from previous day: 09/29 0701 - 09/30 0700 In: 430 [P.O.:360; I.V.:5; IV Piggyback:50] Out: 85 [Drains:85] Intake/Output this shift: No intake/output data recorded.  PE: Abd: soft, less tender today, but still appropriately tender around his drain, minimal bloody output from perc chole drain, +BS, morbidly obese  Lab Results:  Recent Labs    08/22/18 0350 08/23/18 0516  WBC 8.5 8.3  HGB 16.5 14.3  HCT 49.6 43.4  PLT PLATELET CLUMPS NOTED ON SMEAR, COUNT APPEARS DECREASED 199   BMET Recent Labs    08/22/18 0350 08/23/18 0516  NA 132* 130*  K 4.3 4.0  CL 91* 90*  CO2 21* 21*  GLUCOSE 154* 187*  BUN 65* 93*  CREATININE 6.27* 7.85*  CALCIUM 8.6* 8.7*   PT/INR No results for input(s): LABPROT, INR in the last 72 hours. CMP     Component Value Date/Time   NA 130 (L) 08/23/2018 0516   K 4.0 08/23/2018 0516   CL 90 (L) 08/23/2018 0516   CO2 21 (L) 08/23/2018 0516   GLUCOSE 187 (H) 08/23/2018 0516   BUN 93 (H) 08/23/2018 0516   CREATININE 7.85 (H) 08/23/2018 0516   CALCIUM 8.7 (L) 08/23/2018 0516   PROT 6.2 (L) 08/22/2018 0350   ALBUMIN 2.5 (L) 08/22/2018 0350   AST 34 08/22/2018 0350   ALT 88 (H) 08/22/2018 0350   ALKPHOS 295 (H) 08/22/2018 0350   BILITOT 1.8 (H) 08/22/2018 0350   GFRNONAA 6 (L) 08/23/2018 0516   GFRAA 7 (L) 08/23/2018 0516   Lipase     Component Value Date/Time   LIPASE 30 08/19/2018 0433       Studies/Results: No results  found.  Anti-infectives: Anti-infectives (From admission, onward)   Start     Dose/Rate Route Frequency Ordered Stop   08/23/18 1000  amoxicillin-clavulanate (AUGMENTIN) 875-125 MG per tablet 1 tablet     1 tablet Oral Every 12 hours 08/23/18 0853 08/27/18 0959   08/18/18 1000  piperacillin-tazobactam (ZOSYN) IVPB 4.5 g  Status:  Discontinued     4.5 g 200 mL/hr over 30 Minutes Intravenous Every 12 hours 08/17/18 2106 08/17/18 2133   08/17/18 2200  piperacillin-tazobactam (ZOSYN) IVPB 3.375 g  Status:  Discontinued     3.375 g 12.5 mL/hr over 240 Minutes Intravenous Every 12 hours 08/17/18 2134 08/23/18 0853       Assessment/Plan Acute cholecystitis -s/p perc chole drain on 9/26. -slowing improving -convert to oral abx therapy today, Augmentin for 4 more days. -cont to encourage po intake.  Unclear why he has persistent nausea currently, although seems improved as he stated he was eating better yesterday.  Labs are all normalizing well. -mobilize with PT who is recommending CIR, defer to primary service  HTN First degree AV block CAD COPD ESRD on HD DM OSA AMS- improved  FEN -renal diet VTE -SCDs/heparin ID -zosyn 9/24 -->9/30, Augmentin 9/30 4 additional days, stop date  placed in computer   LOS: 6 days    Henreitta Cea , [facility] Surgery 08/23/2018, 8:54 AM Pager: [phone]

## 2018-08-23 NOTE — Progress Notes (Signed)
Christian Dalton  YTK:[id number] DOB: 28-Jun-1941 DOA: 08/17/2018 PCP: [place]aline Kluver, MD    Brief Narrative:  77 y.o. male w/ a hx of ESRD on hemodialysis, diabetes, and hypertension who presented with epigastric pain. An US showed acute cholecystitis.  Subjective: Complains of abdominal pain, but they are controlled with current oral regimen, reports bowel movement yesterday, no nausea or vomiting  Assessment & Plan:  Sepsis due to acute cholecystitis -Sepsis criteria met on admission as had leukocytosis, tachycardia with altered mental status -Significant for acute cholecystitis, general surgery input greatly appreciated, MRI was significant for 1.7 gallstone in gallbladder neck no common bile duct dilation, patient was high risk for surgery, so went for  percutaneous drain per IR 08/19/2018  -Leukocytosis trending down, and judgment for general surgery, was initially on IV Zosyn, was transitioned to Augmentin 08/23/2018 .  Acute encephalopathy multifactorial in setting of narcotic use, end-stage renal disease, and sepsis - mental status significantly improved, back to baseline  1.1cm Cystic lesion in pancreatic body  Will need f/u MRI in outpt setting (formal rec is in 2 years)   ESRD on dialysis Dialysis per nephrology due for dialysis tomorrow  DM2  CBG trending upward - diet being advanced - adjust tx and follow   Obesity, Class III, BMI 40-49.9 (morbid obesity)  Hyperkalemia Nephrology is addressing   DVT prophylaxis: SQ heparin  Code Status: FULL CODE Family Communication: None at bedside Disposition Plan: Home with home care  Consultants:  Gen Surgery Nephrology  GI IR  Antimicrobials:  Zosyn 9/24 >  Objective: Blood pressure 111/80, pulse 84, temperature 97.6 F (36.4 C), temperature source Oral, resp. rate 20, height '5\' 10"'  (1.778 m), weight 118.1 kg, SpO2 95 %.  Intake/Output Summary (Last 24 hours) at 08/23/2018 1529 Last data filed at 08/23/2018  1350 Gross per 24 hour  Intake 200 ml  Output 85 ml  Net 115 ml   Filed Weights   08/21/18 0700 08/21/18 1115 08/21/18 1200  Weight: 119.7 kg 117.8 kg 118.1 kg    Examination:  Awake Alert, Oriented X 3, No new F.N deficits, Normal affect Symmetrical Chest wall movement, Good air movement bilaterally, CTAB RRR,No Gallops,Rubs or new Murmurs, No Parasternal Heave +ve B.Sounds, Abd Soft, minimal tenderness around percutaneous insertion site, No rebound - guarding or rigidity. No Cyanosis, Clubbing or edema, No new Rash or bruise      CBC: Recent Labs  Lab 08/21/18 0313 08/22/18 0350 08/23/18 0516  WBC 10.7* 8.5 8.3  HGB 15.0 16.5 14.3  HCT 45.4 49.6 43.4  MCV 92.1 91.9 94.1  PLT 156 PLATELET CLUMPS NOTED ON SMEAR, COUNT APPEARS DECREASED 751   Basic Metabolic Panel: Recent Labs  Lab 08/20/18 0419 08/21/18 0313 08/22/18 0350 08/23/18 0516  NA 137 132* 132* 130*  K 5.6* 4.7 4.3 4.0  CL 95* 88* 91* 90*  CO2 23 22 21* 21*  GLUCOSE 280* 179* 154* 187*  BUN 63* 93* 65* 93*  CREATININE 6.82* 7.98* 6.27* 7.85*  CALCIUM 9.7 9.5 8.6* 8.7*  MG  --  2.6*  --   --   PHOS 8.6* 9.7*  --   --    GFR: Estimated Creatinine Clearance: 10.5 mL/min (A) (by C-G formula based on SCr of 7.85 mg/dL (H)).  Liver Function Tests: Recent Labs  Lab 08/18/18 1423 08/19/18 0433 08/20/18 0419 08/21/18 0313 08/22/18 0350  AST 206* 91*  --  23 34  ALT 416* 278*  --  115* 88*  ALKPHOS  332* 330*  --  284* 295*  BILITOT 5.0* 4.5*  --  1.7* 1.8*  PROT 6.3* 6.4*  --  6.5 6.2*  ALBUMIN 2.9* 2.7* 2.6* 2.4* 2.5*   Recent Labs  Lab 08/19/18 0433  LIPASE 30   Recent Labs  Lab 08/20/18 0419  AMMONIA 26    Coagulation Profile: Recent Labs  Lab 08/19/18 1231  INR 1.22   HbA1C: Hgb A1c MFr Bld  Date/Time Value Ref Range Status  08/17/2018 07:18 PM 9.7 (H) 4.8 - 5.6 % Final    Comment:    (NOTE) Pre diabetes:          5.7%-6.4% Diabetes:              >6.4% Glycemic control  for   <7.0% adults with diabetes     CBG: Recent Labs  Lab 08/22/18 1237 08/22/18 1755 08/22/18 2155 08/23/18 0751 08/23/18 1126  GLUCAP 182* 290* 248* 175* 235*    Recent Results (from the past 240 hour(s))  Surgical pcr screen     Status: None   Collection Time: 08/17/18  8:59 PM  Result Value Ref Range Status   MRSA, PCR NEGATIVE NEGATIVE Final   Staphylococcus aureus NEGATIVE NEGATIVE Final    Comment: (NOTE) The Xpert SA Assay (FDA approved for NASAL specimens in patients 62 years of age and older), is one component of a comprehensive surveillance program. It is not intended to diagnose infection nor to guide or monitor treatment. Performed at [facility], [place] [address] [place], [place] [address]   Aerobic/Anaerobic Culture (surgical/deep wound)     Status: None (Preliminary result)   Collection Time: 08/19/18  4:20 PM  Result Value Ref Range Status   Specimen Description BILE  Final   Special Requests NONE  Final   Gram Stain   Final    WBC PRESENT, PREDOMINANTLY MONONUCLEAR NO ORGANISMS SEEN    Culture   Final    NO GROWTH 4 DAYS NO ANAEROBES ISOLATED; CULTURE IN PROGRESS FOR 5 DAYS Performed at [facility], [place] [address] [place], [place] [address]    Report Status PENDING  Incomplete     Scheduled Meds: . [START ON 08/24/2018] amoxicillin-clavulanate  1 tablet Oral Daily  . calcitRIOL  1 mcg Oral Q T,Th,Sa-HD  . Chlorhexidine Gluconate Cloth  6 each Topical Q0600  . escitalopram  10 mg Oral QPM  . feeding supplement (PRO-STAT SUGAR FREE 64)  30 mL Oral BID  . heparin injection (subcutaneous)  5,000 Units Subcutaneous Q8H  . insulin aspart  0-9 Units Subcutaneous TID WC  . insulin detemir  10 Units Subcutaneous BID  . levothyroxine  125 mcg Oral QAC breakfast  . montelukast  10 mg Oral QPM  . multivitamin  1 tablet Oral QHS  . sevelamer carbonate  2,400 mg Oral TID WC  . sodium chloride flush  5 mL Intracatheter Q8H   Continuous  Infusions: . sodium chloride    . sodium chloride       LOS: 6 days   Phillips Climes MD [facility] Office  [phone] Pager - Text Page per Shea Evans  If 7PM-7AM, please contact night-coverage per Amion 08/23/2018, 3:29 PM

## 2018-08-23 NOTE — Progress Notes (Signed)
Physical Therapy Treatment Patient Details Name: Christian Dalton MRN: [id number] DOB: July 25, 1941 Today's Date: 08/23/2018    History of Present Illness Christian Dalton is a 77yo M admitted with epigastric pain, dx of acute cholecystitis. PMH significant for CAD, ESRD on HD, and DM.     PT Comments    Patient progressing well, ambulating with supervision using RW. No physical assist for bed mobility, transfers, or ambulation today. Min guard for safety. Increased time and effort to perform. Recommendations have been updated at this time, feel patient would be more appropriate for HHPT services.   Follow Up Recommendations  Home health PT;Supervision for mobility/OOB     Equipment Recommendations  Rolling walker with 5" wheels    Recommendations for Other Services       Precautions / Restrictions Precautions Precautions: Fall;Other (comment) Precaution Comments: RUQ drain  Restrictions Weight Bearing Restrictions: No    Mobility  Bed Mobility Overal bed mobility: Needs Assistance Bed Mobility: Sidelying to Sit;Rolling Rolling: Min guard Sidelying to sit: Min guard       General bed mobility comments: Increased time and effort to perform but no physical assist required  Transfers Overall transfer level: Needs assistance Equipment used: Rolling walker (2 wheeled) Transfers: Sit to/from [facility] Sit to Stand: Min guard         General transfer comment: Min guard for safety when powering up to standing, no physical assist required  Ambulation/Gait Ambulation/Gait assistance: Supervision Gait Distance (Feet): 60 Feet Assistive device: Rolling walker (2 wheeled) Gait Pattern/deviations: Step-through pattern;Decreased stride length;Wide base of support   Gait velocity interpretation: <1.8 ft/sec, indicate of risk for recurrent falls General Gait Details: patient agreeable to multiple bouts of inroom ambulation, no physical assist required. Tolerated well  with use of RW.   Stairs             Wheelchair Mobility    Modified Rankin (Stroke Patients Only)       Balance Overall balance assessment: Needs assistance Sitting-balance support: Feet supported;No upper extremity supported Sitting balance-Leahy Scale: Good     Standing balance support: Bilateral upper extremity supported;During functional activity Standing balance-Leahy Scale: Fair Standing balance comment: reliance on UE support but able to release from RW for static standing                            Cognition Arousal/Alertness: Awake/alert Behavior During Therapy: WFL for tasks assessed/performed Overall Cognitive Status: Within Functional Limits for tasks assessed                                        Exercises      General Comments        Pertinent Vitals/Pain Pain Assessment: Faces Faces Pain Scale: Hurts little more Pain Location: R flank Pain Descriptors / Indicators: Sore Pain Intervention(s): Monitored during session    Home Living                      Prior Function            PT Goals (current goals can now be found in the care plan section) Acute Rehab PT Goals Patient Stated Goal: "to get stronger" PT Goal Formulation: With patient Time For Goal Achievement: 09/03/18 Potential to Achieve Goals: Good Progress towards PT goals: Progressing toward goals    Frequency  Min 3X/week      PT Plan Discharge plan needs to be updated    Co-evaluation              AM-PAC PT "6 Clicks" Daily Activity  Outcome Measure  Difficulty turning over in bed (including adjusting bedclothes, sheets and blankets)?: A Little Difficulty moving from lying on back to sitting on the side of the bed? : A Little Difficulty sitting down on and standing up from a chair with arms (e.g., wheelchair, bedside commode, etc,.)?: A Little Help needed moving to and from a bed to chair (including a wheelchair)?: A  Little Help needed walking in hospital room?: A Lot Help needed climbing 3-5 steps with a railing? : A Lot 6 Click Score: 16    End of Session Equipment Utilized During Treatment: Gait belt Activity Tolerance: Patient limited by pain Patient left: in bed;with call bell/phone within reach;with bed alarm set(sitting at edge of bed to eat) Nurse Communication: Other (comment)(nurse tech advised to please keep eye on patient and help with back to bed due to drain/lines ) PT Visit Diagnosis: Muscle weakness (generalized) (M62.81);Other abnormalities of gait and mobility (R26.89);Difficulty in walking, not elsewhere classified (R26.2)     Time: 8325-4982 PT Time Calculation (min) (ACUTE ONLY): 21 min  Charges:  $Gait Training: 8-22 mins                     Alben Deeds, PT DPT  Board Certified Neurologic Specialist [place] Pager [phone] Office [phone]    Duncan Dull 08/23/2018, 2:02 PM

## 2018-08-23 NOTE — Progress Notes (Signed)
Inpatient Rehabilitation Admissions Coordinator  Upon entering pt room, patient was sleeping. I awakened him to discuss his wishes for continued rehab. He states he is not up to any rehab right now, for he is hurting. I encouraged him to get up and move with therapy and nursing to get stronger. He states he just want to go home when he is up to it. I discussed a possible inpt rehab or SNF if he does not progress with mobility. I will follow up tomorrow.  Danne Baxter, RN, MSN Rehab Admissions Coordinator [phone] 08/23/2018 1:19 PM

## 2018-08-23 NOTE — Progress Notes (Signed)
Inpatient Rehabilitation Admissions Coordinator  Patient has progressed well with therapy . No longer in need of an inpt rehab admit. Rec Home with [facility] when medically ready. We will sign off at this time. I will alert RN CM.  Danne Baxter, RN, MSN Rehab Admissions Coordinator [phone] 08/23/2018 3:31 PM

## 2018-08-23 NOTE — Progress Notes (Signed)
[facility] Progress Note   Subjective:  Lying in bed. Sleepy, but rouses easily. Asking about food  Some soreness around drain, nausea.  No CP, SOB, N,V,D   Objective Vitals:   08/22/18 0415 08/22/18 1545 08/22/18 2157 08/23/18 0513  BP: 121/79 120/72 121/66 133/74  Pulse: 98  87 81  Resp:  18    Temp: 98.3 F (36.8 C) 98 F (36.7 C) 98.5 F (36.9 C) 98.7 F (37.1 C)  TempSrc: Oral Oral Oral Oral  SpO2: 95%  95% 97%  Weight:      Height:       Physical Exam General: Obese male NAD  Heart: RRR  Lungs: CTAB Abdomen: obese chole tube with bloody drainage Extremities: No LE edema  Dialysis Access: LUE AVF +bruit   Dialysis Orders:  [facility] 4h 500/800 EDW 118kg 2K/2.25Ca L AVF Heparin bolus 7000 U Calcitriol 1.0 mcg TIW Auyrxia 3 tabs qactid  Assessment/Plan: 1 Acute cholecystitis - s/p IR cholecystostomy drain 9/26. On Augmentin- changed to renal appropriate dosing.  LFTs/WBCs improving. Conservative treatment so far. Per primary/Surgery.  2. ESRD- TTS -Next HD 10/1 3. Anemia- Hgb >13. Stop Ferric based binders, No ESA.  4. Secondary hyperparathyroidism - P 9.7, HGB high. Stop Auryxia. Start Renvela 2400 mg PO TID. Monitor tolerance. Check renal function panel with next HD.  5.BP/volume- HD tomorrow. Poor PO intake, no evidence of volume overload by exam. BP controlled. Last wt 118.1 kg 08/21/18.  6.Nutrition -  Renal carb mod/renavite/prostat for low albumin  7. Hyperkalemia- Has rec'd [place] since admit. Fistulagram done 9/27 -negative. Uses 2.0 K bath at [place].  8. DM -per primary  Lynnda Child PA-C [place] Pager [phone] 08/23/2018,10:10 AM  LOS: 6 days   Additional Objective Labs: Basic Metabolic Panel: Recent Labs  Lab 08/20/18 0419 08/21/18 0313 08/22/18 0350 08/23/18 0516  NA 137 132* 132* 130*  K 5.6* 4.7 4.3 4.0  CL 95* 88* 91* 90*  CO2 23 22 21* 21*  GLUCOSE 280* 179* 154* 187*   BUN 63* 93* 65* 93*  CREATININE 6.82* 7.98* 6.27* 7.85*  CALCIUM 9.7 9.5 8.6* 8.7*  PHOS 8.6* 9.7*  --   --    CBC: Recent Labs  Lab 08/19/18 0433 08/20/18 0416 08/21/18 0313 08/22/18 0350 08/23/18 0516  WBC 16.4* 14.6* 10.7* 8.5 8.3  HGB 15.6 16.0 15.0 16.5 14.3  HCT 48.0 48.8 45.4 49.6 43.4  MCV 95.0 94.6 92.1 91.9 94.1  PLT 148* 169 156 PLATELET CLUMPS NOTED ON SMEAR, COUNT APPEARS DECREASED 199   Blood Culture    Component Value Date/Time   SDES BILE 08/19/2018 1620   SPECREQUEST NONE 08/19/2018 1620   CULT  08/19/2018 1620    NO GROWTH 3 DAYS NO ANAEROBES ISOLATED; CULTURE IN PROGRESS FOR 5 DAYS Performed at [facility], [place] [address], [place], [place] [address]    REPTSTATUS PENDING 08/19/2018 1620    Cardiac Enzymes: No results for input(s): CKTOTAL, CKMB, CKMBINDEX, TROPONINI in the last 168 hours. CBG: Recent Labs  Lab 08/22/18 0833 08/22/18 1237 08/22/18 1755 08/22/18 2155 08/23/18 0751  GLUCAP 136* 182* 290* 248* 175*   Iron Studies: No results for input(s): IRON, TIBC, TRANSFERRIN, FERRITIN in the last 72 hours. Lab Results  Component Value Date   INR 1.22 08/19/2018   Medications: . sodium chloride    . sodium chloride     . amoxicillin-clavulanate  1 tablet Oral Q12H  . calcitRIOL  1  mcg Oral Q T,Th,Sa-HD  . Chlorhexidine Gluconate Cloth  6 each Topical Q0600  . escitalopram  10 mg Oral QPM  . feeding supplement (PRO-STAT SUGAR FREE 64)  30 mL Oral BID  . heparin injection (subcutaneous)  5,000 Units Subcutaneous Q8H  . insulin aspart  0-9 Units Subcutaneous TID WC  . insulin detemir  10 Units Subcutaneous BID  . levothyroxine  125 mcg Oral QAC breakfast  . montelukast  10 mg Oral QPM  . multivitamin  1 tablet Oral QHS  . sevelamer carbonate  2,400 mg Oral TID WC  . sodium chloride flush  5 mL Intracatheter Q8H

## 2018-08-24 DIAGNOSIS — N186 End stage renal disease: Secondary | ICD-10-CM | POA: Diagnosis not present

## 2018-08-24 DIAGNOSIS — I10 Essential (primary) hypertension: Secondary | ICD-10-CM

## 2018-08-24 DIAGNOSIS — Z992 Dependence on renal dialysis: Secondary | ICD-10-CM | POA: Diagnosis not present

## 2018-08-24 DIAGNOSIS — E1122 Type 2 diabetes mellitus with diabetic chronic kidney disease: Secondary | ICD-10-CM | POA: Diagnosis not present

## 2018-08-24 LAB — CBC
HCT: 44.6 % (ref 39.0–52.0)
Hemoglobin: 14.7 g/dL (ref 13.0–17.0)
MCH: 30.5 pg (ref 26.0–34.0)
MCHC: 33 g/dL (ref 30.0–36.0)
MCV: 92.5 fL (ref 78.0–100.0)
Platelets: 276 10*3/uL (ref 150–400)
RBC: 4.82 MIL/uL (ref 4.22–5.81)
RDW: 14.7 % (ref 11.5–15.5)
WBC: 9.4 10*3/uL (ref 4.0–10.5)

## 2018-08-24 LAB — RENAL FUNCTION PANEL
Albumin: 2.8 g/dL — ABNORMAL LOW (ref 3.5–5.0)
Albumin: 2.5 g/dL — ABNORMAL LOW (ref 3.5–5.0)
Anion gap: 19 — ABNORMAL HIGH (ref 5–15)
Anion gap: 20 — ABNORMAL HIGH (ref 5–15)
BUN: 109 mg/dL — ABNORMAL HIGH (ref 8–23)
BUN: 110 mg/dL — ABNORMAL HIGH (ref 8–23)
CO2: 24 mmol/L (ref 22–32)
CO2: 21 mmol/L — ABNORMAL LOW (ref 22–32)
Calcium: 8.8 mg/dL — ABNORMAL LOW (ref 8.9–10.3)
Calcium: 8.4 mg/dL — ABNORMAL LOW (ref 8.9–10.3)
Chloride: 89 mmol/L — ABNORMAL LOW (ref 98–111)
Chloride: 89 mmol/L — ABNORMAL LOW (ref 98–111)
Creatinine, Ser: 9.5 mg/dL — ABNORMAL HIGH (ref 0.61–1.24)
Creatinine, Ser: 9.32 mg/dL — ABNORMAL HIGH (ref 0.61–1.24)
GFR calc Af Amer: 5 mL/min — ABNORMAL LOW (ref >60)
GFR calc Af Amer: 6 mL/min — ABNORMAL LOW (ref >60)
GFR calc non Af Amer: 5 mL/min — ABNORMAL LOW (ref >60)
GFR calc non Af Amer: 5 mL/min — ABNORMAL LOW (ref >60)
Glucose, Bld: 169 mg/dL — ABNORMAL HIGH (ref 70–99)
Glucose, Bld: 191 mg/dL — ABNORMAL HIGH (ref 70–99)
Phosphorus: 10.3 mg/dL — ABNORMAL HIGH (ref 2.5–4.6)
Phosphorus: 10.2 mg/dL — ABNORMAL HIGH (ref 2.5–4.6)
Potassium: 4 mmol/L (ref 3.5–5.1)
Potassium: 4.1 mmol/L (ref 3.5–5.1)
Sodium: 132 mmol/L — ABNORMAL LOW (ref 135–145)
Sodium: 130 mmol/L — ABNORMAL LOW (ref 135–145)

## 2018-08-24 LAB — GLUCOSE, CAPILLARY
Glucose-Capillary: 175 mg/dL — ABNORMAL HIGH (ref 70–99)
Glucose-Capillary: 197 mg/dL — ABNORMAL HIGH (ref 70–99)

## 2018-08-24 MED ORDER — SODIUM CHLORIDE 0.9 % IV SOLN: 100.0000 mL | INTRAVENOUS | Status: DC | PRN

## 2018-08-24 MED ORDER — HEPARIN SODIUM (PORCINE) 1000 UNIT/ML DIALYSIS
1000.0000 [IU] | INTRAMUSCULAR | Status: DC | PRN
  Filled 2018-08-24: qty 1

## 2018-08-24 MED ORDER — OXYCODONE HCL 5 MG PO TABS: 5.0000 mg | ORAL_TABLET | ORAL | 0 refills | Status: DC | PRN

## 2018-08-24 MED ORDER — INSULIN LISPRO 100 UNIT/ML ~~LOC~~ SOLN: 5.0000 [IU] | Freq: Two times a day (BID) | SUBCUTANEOUS | 11 refills | Status: DC

## 2018-08-24 MED ORDER — ATORVASTATIN CALCIUM 20 MG PO TABS: 20.0000 mg | ORAL_TABLET | Freq: Every evening | ORAL | Status: DC

## 2018-08-24 MED ORDER — SEVELAMER CARBONATE 800 MG PO TABS: 2400.0000 mg | ORAL_TABLET | Freq: Three times a day (TID) | ORAL | 0 refills | Status: AC

## 2018-08-24 MED ORDER — SEVELAMER CARBONATE 800 MG PO TABS: 2400.0000 mg | ORAL_TABLET | Freq: Three times a day (TID) | ORAL | 0 refills | Status: DC

## 2018-08-24 MED ORDER — INSULIN DETEMIR 100 UNIT/ML ~~LOC~~ SOLN: 14.0000 [IU] | Freq: Two times a day (BID) | SUBCUTANEOUS | 11 refills | Status: DC

## 2018-08-24 MED ORDER — CALCITRIOL 0.5 MCG PO CAPS
ORAL_CAPSULE | ORAL | Status: AC
  Administered 2018-08-24: 1 ug
  Filled 2018-08-24: qty 2

## 2018-08-24 MED ORDER — AMOXICILLIN-POT CLAVULANATE 500-125 MG PO TABS: 1.0000 | ORAL_TABLET | Freq: Every day | ORAL | 0 refills | Status: DC

## 2018-08-24 MED ORDER — LIDOCAINE-PRILOCAINE 2.5-2.5 % EX CREA
1.0000 "application " | TOPICAL_CREAM | CUTANEOUS | Status: DC | PRN
  Filled 2018-08-24: qty 5

## 2018-08-24 MED ORDER — ALTEPLASE 2 MG IJ SOLR
2.0000 mg | Freq: Once | INTRAMUSCULAR | Status: DC | PRN
  Filled 2018-08-24: qty 2

## 2018-08-24 MED ORDER — PENTAFLUOROPROP-TETRAFLUOROETH EX AERO: 1.0000 "application " | INHALATION_SPRAY | CUTANEOUS | Status: DC | PRN

## 2018-08-24 MED ORDER — PRO-STAT SUGAR FREE PO LIQD: 30.0000 mL | Freq: Two times a day (BID) | ORAL | 0 refills | Status: DC

## 2018-08-24 MED ORDER — LIDOCAINE HCL (PF) 1 % IJ SOLN
5.0000 mL | INTRAMUSCULAR | Status: DC | PRN
  Filled 2018-08-24: qty 5

## 2018-08-24 MED ORDER — CALCITRIOL 0.5 MCG PO CAPS: 1.0000 ug | ORAL_CAPSULE | ORAL | 0 refills | Status: DC

## 2018-08-24 MED FILL — AMOX-CLAV 500-125 MG TABLET: 500-125 | 3 days supply | Qty: 3 | Fill #0

## 2018-08-24 MED FILL — oxyCODONE HCL 5 MG TABS: 5 | 5 days supply | Qty: 30 | Fill #0

## 2018-08-24 NOTE — Progress Notes (Signed)
Pt discharged to home with wife. IV removed and intact. Pt A&Ox4 and stable. AVS given and reviewed with patient, wife and son. Pt escorted out via wheel chair.

## 2018-08-24 NOTE — Progress Notes (Addendum)
Patient ID: Christian Dalton, male   DOB: 10/10/1941, 77 y.o.   MRN: [id number]       Subjective: Patient says he feels alright today.  C/o redness and discomfort around his eyes.  Eating better.  Objective: Vital signs in last 24 hours: Temp:  [97.6 F (36.4 C)-98.5 F (36.9 C)] 98.3 F (36.8 C) (10/01 0904) Pulse Rate:  [82-84] 82 (10/01 0915) Resp:  [17-20] 17 (10/01 0915) BP: (111-148)/(68-81) 132/68 (10/01 0915) SpO2:  [95 %-98 %] 98 % (10/01 0904) Weight:  [118.6 kg] 118.6 kg (10/01 0904) Last BM Date: 08/22/18  Intake/Output from previous day: 09/30 0701 - 10/01 0700 In: 730 [P.O.:720; I.V.:10] Out: 480 [Urine:450; Drains:30] Intake/Output this shift: Total I/O In: 5 [I.V.:5] Out: -   PE: Abd: soft, still with mild RUQ tenderness, but improved, +BS, drain with bloody bilious output. Eyes: periorbital erythema specifically on his lids  Lab Results:  Recent Labs    08/23/18 0516 08/24/18 0829  WBC 8.3 9.4  HGB 14.3 14.7  HCT 43.4 44.6  PLT 199 276   BMET Recent Labs    08/22/18 0350 08/23/18 0516  NA 132* 130*  K 4.3 4.0  CL 91* 90*  CO2 21* 21*  GLUCOSE 154* 187*  BUN 65* 93*  CREATININE 6.27* 7.85*  CALCIUM 8.6* 8.7*   PT/INR No results for input(s): LABPROT, INR in the last 72 hours. CMP     Component Value Date/Time   NA 130 (L) 08/23/2018 0516   K 4.0 08/23/2018 0516   CL 90 (L) 08/23/2018 0516   CO2 21 (L) 08/23/2018 0516   GLUCOSE 187 (H) 08/23/2018 0516   BUN 93 (H) 08/23/2018 0516   CREATININE 7.85 (H) 08/23/2018 0516   CALCIUM 8.7 (L) 08/23/2018 0516   PROT 6.2 (L) 08/22/2018 0350   ALBUMIN 2.5 (L) 08/22/2018 0350   AST 34 08/22/2018 0350   ALT 88 (H) 08/22/2018 0350   ALKPHOS 295 (H) 08/22/2018 0350   BILITOT 1.8 (H) 08/22/2018 0350   GFRNONAA 6 (L) 08/23/2018 0516   GFRAA 7 (L) 08/23/2018 0516   Lipase     Component Value Date/Time   LIPASE 30 08/19/2018 0433       Studies/Results: No results  found.  Anti-infectives: Anti-infectives (From admission, onward)   Start     Dose/Rate Route Frequency Ordered Stop   08/24/18 1000  amoxicillin-clavulanate (AUGMENTIN) 500-125 MG per tablet 500 mg     1 tablet Oral Daily 08/23/18 1025 08/28/18 0959   08/23/18 1000  amoxicillin-clavulanate (AUGMENTIN) 875-125 MG per tablet 1 tablet  Status:  Discontinued     1 tablet Oral Every 12 hours 08/23/18 0853 08/23/18 1025   08/18/18 1000  piperacillin-tazobactam (ZOSYN) IVPB 4.5 g  Status:  Discontinued     4.5 g 200 mL/hr over 30 Minutes Intravenous Every 12 hours 08/17/18 2106 08/17/18 2133   08/17/18 2200  piperacillin-tazobactam (ZOSYN) IVPB 3.375 g  Status:  Discontinued     3.375 g 12.5 mL/hr over 240 Minutes Intravenous Every 12 hours 08/17/18 2134 08/23/18 0853       Assessment/Plan Acute cholecystitis -s/p perc chole drain on 9/26. -slowing improving -will need 3 more days with oral abx therapy and then no further abx therapy warranted. -surgically stable and ok for DC home from our standpoint -will follow up with Dr. Grandville Silos in several weeks. -follow up in drain clinic in 4-6 weeks for drain study.  Drain care per IR  HTN First degree AV block  CAD COPD ESRD on HD DM OSA AMS- improved  FEN -renal diet VTE -SCDs/heparin ID -zosyn 9/24 -->9/30, Augmentin 9/30, stop date 10-3   LOS: 7 days    Henreitta Cea , [facility] [facility] Surgery 08/24/2018, 9:53 AM Pager: [phone]

## 2018-08-24 NOTE — Discharge Summary (Signed)
Christian Dalton, is a 77 y.o. male  DOB 07-14-1941  MRN [id number].  Admission date:  08/17/2018  Admitting Physician  Bonnell Public, MD  Discharge Date:  08/24/2018   Primary MD  Street, Sharon Mt, MD  Recommendations for primary care physician for things to follow:  -Check CBC, CMP during next visit -Follow-up with general surgery, and interventional radiology as an outpatient   Admission Diagnosis  UNKNOWN   Discharge Diagnosis  UNKNOWN    Principal Problem:   Acute cholecystitis Active Problems:   ESRD (end stage renal disease) on dialysis ([place])   DM2 (diabetes mellitus, type 2) ([place])   HTN (hypertension)   CAD (coronary artery disease)   Obesity, Class III, BMI 40-49.9 (morbid obesity) ([place])      Past Medical History:  Diagnosis Date  . CAD (coronary artery disease)   . COPD (chronic obstructive pulmonary disease) ([place])   . Diabetes mellitus without complication ([place])   . ESRD (end stage renal disease) on dialysis ([place])   . Hypertension   . OSA (obstructive sleep apnea)   . Renal disorder   . Spinal stenosis     Past Surgical History:  Procedure Laterality Date  . HAND SURGERY Right   . IR DIALY SHUNT INTRO NEEDLE/INTRACATH INITIAL W/IMG LEFT Left 08/20/2018  . IR PERC CHOLECYSTOSTOMY  08/19/2018  . SHOULDER SURGERY Right   . TIBIA FRACTURE SURGERY         History of present illness and  Hospital Course:     Kindly see H&P for history of present illness and admission details, please review complete Labs, Consult reports and Test reports for all details in brief  HPI  from the history and physical done on the day of admission   HPI: Christian Dalton is a 77 y.o. male with medical history significant of end-stage renal disease on hemodialysis, diabetes, hypertension, coronary artery disease presents with epigastric pain.  Of note patient is quite lethargic after getting  some IV morphine at the outlying facility so he cannot give me any history and he is unaccompanied.  However per checkout and per documentation patient came in complaining of epigastric discomfort.  Did not have any fever or nausea, vomiting.  Patient had a CT scan of his chest and abdomen as well as an ultrasound of his abdomen that showed changes suggestive of acute cholecystitis.  CT of his chest was negative for any acute infiltrate or PE.  Patient did not have a white count was only 10,000.  ED Course: Patient was accepted for transfer here and Dr. Grandville Silos general surgery was consulted.  Received IV hydralazine ,8 mg of IV morphine total, and ertapenem at the outlying facility.  Hospital Course  77 y.o.malew/ a hx of ESRD on hemodialysis, diabetes, and hypertension who presented with epigastric pain. An US showed acute cholecystitis.  Sepsis due to acute cholecystitis -Sepsis criteria met on admission as had leukocytosis, tachycardia with altered mental status -Significant for acute cholecystitis, general surgery input greatly appreciated, MRI was significant  for 1.7 gallstone in gallbladder neck no common bile duct dilation, patient was high risk for surgery, so went for  percutaneous drain per IR 08/19/2018  -Leukocytosis trending down, and judgment for general surgery, was initially on IV Zosyn, was transitioned to Augmentin 08/23/2018 , 3 days as an outpatient, to follow general surgery in 6 weeks regarding decision about drain and surgery, and meanwhile to keep following with interventional radiology about drain care.  Acute encephalopathy multifactorial in setting of narcotic use, end-stage renal disease, and sepsis - mental status significantly improved, back to baseline  1.1cm Cystic lesion in pancreatic body  Will need f/u MRI in outpt setting (formal rec is in 2 years), to follow with GI as an outpatient  ESRD on dialysis Dialysis per nephrology   DM2  It was on 50 units of  Lantus twice daily, during hospital stay due to compliance with diet, he had much less or needs, he will be discharged on 15 units of Lantus twice daily, and NovoLog 5 before meals  Obesity, Class III, BMI 40-49.9 (morbid obesity)  Hyperkalemia Nephrology is addressing    Discharge Condition:  Stable   Follow UP  Follow-up Information    Brahmbhatt, Parag, MD. Schedule an appointment as soon as possible for a visit in 2 month(s).   Specialty:  Gastroenterology Why:  follow-up for abnormal MRI and abnormal LFTs Contact information: [place] [address] [phone]        Georganna Skeans, MD Follow up on 09/15/2018.   Specialty:  General Surgery Why:  9:00am, arrive by 08:30am for paperwork and check in Contact information: [address] [place] [address] [phone]        Corrie Mckusick, DO Follow up in 6 week(s).   Specialties:  Interventional Radiology, Radiology Contact information: [address] [place] [address] [phone]             Discharge Instructions  and  Discharge Medications     Discharge Instructions    Discharge instructions   Complete by:  As directed    Follow with Primary MD Street, Sharon Mt, MD in 7 days   Get CBC, CMP,checked  by Primary MD next visit.    Activity: As tolerated with Full fall precautions use walker/cane & assistance as needed   Disposition Home    Diet: Renal, carb modified with 1200 cc fluid restriction, low-fat For Heart failure patients - Check your Weight same time everyday, if you gain over 2 pounds, or you develop in leg swelling, experience more shortness of breath or chest pain, call your Primary MD immediately. Follow Cardiac Low Salt Diet and 1.5 lit/day fluid restriction.   On your next visit with your primary care physician please Get Medicines reviewed and adjusted.   Please request your Prim.MD to go over all Hospital Tests  and Procedure/Radiological results at the follow up, please get all Hospital records sent to your Prim MD by signing hospital release before you go home.   If you experience worsening of your admission symptoms, develop shortness of breath, life threatening emergency, suicidal or homicidal thoughts you must seek medical attention immediately by calling 911 or calling your MD immediately  if symptoms less severe.  You Must read complete instructions/literature along with all the possible adverse reactions/side effects for all the Medicines you take and that have been prescribed to you. Take any new Medicines after you have completely understood and accpet all  the possible adverse reactions/side effects.   Do not drive, operating heavy machinery, perform activities at heights, swimming or participation in water activities or provide baby sitting services if your were admitted for syncope or siezures until you have seen by Primary MD or a Neurologist and advised to do so again.  Do not drive when taking Pain medications.    Do not take more than prescribed Pain, Sleep and Anxiety Medications  Special Instructions: If you have smoked or chewed Tobacco  in the last 2 yrs please stop smoking, stop any regular Alcohol  and or any Recreational drug use.  Wear Seat belts while driving.   Please note  You were cared for by a hospitalist during your hospital stay. If you have any questions about your discharge medications or the care you received while you were in the hospital after you are discharged, you can call the unit and asked to speak with the hospitalist on call if the hospitalist that took care of you is not available. Once you are discharged, your primary care physician will handle any further medical issues. Please note that NO REFILLS for any discharge medications will be authorized once you are discharged, as it is imperative that you return to your primary care physician (or establish a  relationship with a primary care physician if you do not have one) for your aftercare needs so that they can reassess your need for medications and monitor your lab values.   Increase activity slowly   Complete by:  As directed      Allergies as of 08/24/2018      Reactions   Avelox [moxifloxacin Hcl] Other (See Comments)   unknown   Sulfa Antibiotics Other (See Comments)   unknown   Tetracyclines & Related Other (See Comments)   unknown      Medication List    STOP taking these medications   HYDROcodone-acetaminophen 10-325 MG tablet Commonly known as:  NORCO   oxyCODONE-acetaminophen 5-325 MG tablet Commonly known as:  PERCOCET/ROXICET     TAKE these medications   amoxicillin-clavulanate 500-125 MG tablet Commonly known as:  AUGMENTIN Take 1 tablet (500 mg total) by mouth daily.   aspirin EC 81 MG tablet Take 81 mg every evening by mouth.   atorvastatin 20 MG tablet Commonly known as:  LIPITOR Take 1 tablet (20 mg total) by mouth every evening.   buPROPion 150 MG 24 hr tablet Commonly known as:  WELLBUTRIN XL Take 150 mg daily by mouth.   docusate sodium 100 MG capsule Commonly known as:  COLACE Take 100 mg 3 (three) times daily by mouth.   escitalopram 10 MG tablet Commonly known as:  LEXAPRO Take 10 mg every evening by mouth.   feeding supplement (PRO-STAT SUGAR FREE 64) Liqd Take 30 mLs by mouth 2 (two) times daily.   insulin detemir 100 UNIT/ML injection Commonly known as:  LEVEMIR Inject 0.14 mLs (14 Units total) into the skin 2 (two) times daily. What changed:  how much to take   insulin lispro 100 UNIT/ML injection Commonly known as:  HUMALOG Inject 0.05 mLs (5 Units total) into the skin 2 (two) times daily. What changed:  how much to take   lactulose (encephalopathy) 10 GM/15ML Soln Commonly known as:  CHRONULAC Take 15-30 mLs by mouth daily before supper.   levothyroxine 125 MCG tablet Commonly known as:  SYNTHROID, LEVOTHROID Take 125 mcg  daily by mouth.   lidocaine-prilocaine cream Commonly known as:  EMLA Apply 1 application  topically See admin instructions. To access site (AVF) 1-2 hours before dialysis. Cover with occlusive dressing (Saran warp)   linaclotide 145 MCG Caps capsule Commonly known as:  LINZESS Take 145 mcg daily after supper by mouth.   loratadine 10 MG tablet Commonly known as:  CLARITIN Take 10 mg every evening by mouth.   montelukast 10 MG tablet Commonly known as:  SINGULAIR Take 10 mg every evening by mouth.   oxyCODONE 5 MG immediate release tablet Commonly known as:  Oxy IR/ROXICODONE Take 1 tablet (5 mg total) by mouth every 4 (four) hours as needed for severe pain.   pregabalin 100 MG capsule Commonly known as:  LYRICA Take 100 mg 2 (two) times daily by mouth.   sevelamer carbonate 800 MG tablet Commonly known as:  RENVELA Take 3 tablets (2,400 mg total) by mouth 3 (three) times daily with meals.         Diet and Activity recommendation: See Discharge Instructions above   Consults obtained -   Gen Surgery Nephrology  GI IR   Major procedures and Radiology Reports - PLEASE review detailed and final reports for all details, in brief -      Mr Abdomen Mrcp Wo Contrast  Result Date: 08/18/2018 CLINICAL DATA:  Elevated liver function tests. Cholelithiasis. Findings suggestive of acute cholecystitis on recent CT and sonography studies. End-stage renal disease on hemodialysis. Abdominal pain. EXAM: MRI ABDOMEN WITHOUT CONTRAST  (INCLUDING MRCP) TECHNIQUE: Multiplanar multisequence MR imaging of the abdomen was performed. Heavily T2-weighted images of the biliary and pancreatic ducts were obtained, and three-dimensional MRCP images were rendered by post processing. COMPARISON:  08/17/2018 abdominal sonogram and CT abdomen/pelvis. FINDINGS: Lower chest: Hypoventilatory changes at the dependent lung bases. Hepatobiliary: Normal liver size and configuration. No hepatic steatosis.  No liver mass. Mildly distended gallbladder. There is a 1.7 cm gallstone in the gallbladder neck. There is layering sludge in the gallbladder. There is new gas in the nondependent fundal gallbladder (series 3/image 25). Moderate diffuse gallbladder wall thickening with trace pericholecystic fluid. No biliary ductal dilatation. Common bile duct diameter 2 mm. No choledocholithiasis. Pancreas: No pancreatic duct dilation. No pancreas divisum. There are several lobulated unilocular small cystic pancreatic lesions scattered throughout the pancreas, largest 1.1 cm in the pancreatic body (series 10/image 60), without appreciable wall thickening or internal complexity. Spleen: Normal size. No mass. Adrenals/Urinary Tract: Normal right adrenal. Left adrenal 1.4 cm nodule with significant loss of signal intensity on out of phase chemical shift imaging, diagnostic of an adenoma. No hydronephrosis. Symmetric renal atrophy. Numerous bilateral renal cysts, largest 2.8 cm in the posterior lower right kidney, several which demonstrate hemorrhagic/proteinaceous signal intensity on T1 imaging, incompletely characterized on this noncontrast scan. Stomach/Bowel: Normal non-distended stomach. Visualized small and large bowel is normal caliber, with no bowel wall thickening. Vascular/Lymphatic: Nonaneurysmal abdominal aorta. No pathologically enlarged lymph nodes in the abdomen. Other: No abdominal ascites or focal fluid collection. Musculoskeletal: No aggressive appearing focal osseous lesions. IMPRESSION: 1. Cholelithiasis with 1.7 cm gallstone in the gallbladder neck. Moderate diffuse gallbladder wall thickening with trace pericholecystic fluid. New gas in nondependent fundal gallbladder. Findings are compatible with acute emphysematous cholecystitis. 2. No biliary ductal dilatation. CBD diameter 2 mm. No choledocholithiasis. 3. 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 Follow-up MRI abdomen recommended  in 2 years. This recommendation follows ACR consensus guidelines: Management of Incidental Pancreatic Cysts: A White Paper of the ACR Incidental Findings Committee. J Am Coll Radiol 3825;05:397-673. 4. Atrophic kidneys with  numerous incompletely characterized renal cysts, several of which are hemorrhagic/proteinaceous. Suggest attention on follow-up MRI abdomen in 6 months. 5. Left adrenal adenoma. Electronically Signed   By: Ilona Sorrel M.D.   On: 08/18/2018 23:59   Mr 3d Recon At Scanner  Result Date: 08/18/2018 CLINICAL DATA:  Elevated liver function tests. Cholelithiasis. Findings suggestive of acute cholecystitis on recent CT and sonography studies. End-stage renal disease on hemodialysis. Abdominal pain. EXAM: MRI ABDOMEN WITHOUT CONTRAST  (INCLUDING MRCP) TECHNIQUE: Multiplanar multisequence MR imaging of the abdomen was performed. Heavily T2-weighted images of the biliary and pancreatic ducts were obtained, and three-dimensional MRCP images were rendered by post processing. COMPARISON:  08/17/2018 abdominal sonogram and CT abdomen/pelvis. FINDINGS: Lower chest: Hypoventilatory changes at the dependent lung bases. Hepatobiliary: Normal liver size and configuration. No hepatic steatosis. No liver mass. Mildly distended gallbladder. There is a 1.7 cm gallstone in the gallbladder neck. There is layering sludge in the gallbladder. There is new gas in the nondependent fundal gallbladder (series 3/image 25). Moderate diffuse gallbladder wall thickening with trace pericholecystic fluid. No biliary ductal dilatation. Common bile duct diameter 2 mm. No choledocholithiasis. Pancreas: No pancreatic duct dilation. No pancreas divisum. There are several lobulated unilocular small cystic pancreatic lesions scattered throughout the pancreas, largest 1.1 cm in the pancreatic body (series 10/image 60), without appreciable wall thickening or internal complexity. Spleen: Normal size. No mass. Adrenals/Urinary Tract: Normal  right adrenal. Left adrenal 1.4 cm nodule with significant loss of signal intensity on out of phase chemical shift imaging, diagnostic of an adenoma. No hydronephrosis. Symmetric renal atrophy. Numerous bilateral renal cysts, largest 2.8 cm in the posterior lower right kidney, several which demonstrate hemorrhagic/proteinaceous signal intensity on T1 imaging, incompletely characterized on this noncontrast scan. Stomach/Bowel: Normal non-distended stomach. Visualized small and large bowel is normal caliber, with no bowel wall thickening. Vascular/Lymphatic: Nonaneurysmal abdominal aorta. No pathologically enlarged lymph nodes in the abdomen. Other: No abdominal ascites or focal fluid collection. Musculoskeletal: No aggressive appearing focal osseous lesions. IMPRESSION: 1. Cholelithiasis with 1.7 cm gallstone in the gallbladder neck. Moderate diffuse gallbladder wall thickening with trace pericholecystic fluid. New gas in nondependent fundal gallbladder. Findings are compatible with acute emphysematous cholecystitis. 2. No biliary ductal dilatation. CBD diameter 2 mm. No choledocholithiasis. 3. 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 Follow-up MRI abdomen recommended in 2 years. This recommendation follows ACR consensus guidelines: Management of Incidental Pancreatic Cysts: A White Paper of the ACR Incidental Findings Committee. J Am Coll Radiol 4580;99:833-825. 4. Atrophic kidneys with numerous incompletely characterized renal cysts, several of which are hemorrhagic/proteinaceous. Suggest attention on follow-up MRI abdomen in 6 months. 5. Left adrenal adenoma. Electronically Signed   By: Ilona Sorrel M.D.   On: 08/18/2018 23:59   Ir Perc Cholecystostomy  Result Date: 08/19/2018 INDICATION: 77 year old male with acute cholecystitis EXAM: IS IMAGE GUIDED PERCUTANEOUS CHOLECYSTOSTOMY MEDICATIONS: In house antibiotics ANESTHESIA/SEDATION: Moderate (conscious) sedation was employed  during this procedure. A total of Versed 1.0 mg and Fentanyl 50 mcg was administered intravenously. Moderate Sedation Time: 10 minutes. The patient's level of consciousness and vital signs were monitored continuously by radiology nursing throughout the procedure under my direct supervision. FLUOROSCOPY TIME:  Fluoroscopy Time: 0 minutes 18 seconds (25 mGy). COMPLICATIONS: None PROCEDURE: Informed written consent was obtained from the patient and the patient's family after a thorough discussion of the procedural risks, benefits and alternatives. All questions were addressed. Maximal Sterile Barrier Technique was utilized including caps, mask, sterile gowns, sterile gloves, sterile drape,  hand hygiene and skin antiseptic. A timeout was performed prior to the initiation of the procedure. Ultrasound survey of the right upper quadrant was performed for planning purposes. Once the patient is prepped and draped in the usual sterile fashion, the skin and subcutaneous tissues overlying the gallbladder were generously infiltrated 1% lidocaine for local anesthesia. A coaxial needle was advanced under ultrasound guidance through the skin subcutaneous tissues and a small segment of liver into the gallbladder lumen. With removal of the stylet, spontaneous dark bile drainage occurred. Using modified Seldinger technique, a 10 French drain was placed into the gallbladder fossa, with aspiration of the sample for the lab. Contrast injection confirmed position of the tube within the gallbladder lumen. Drainage catheter was attached to gravity drain with a suture retention placed. Patient tolerated the procedure well and remained hemodynamically stable throughout. No complications were encountered and no significant blood loss encountered. IMPRESSION: Status post image guided percutaneous cholecystostomy. Signed, Dulcy Fanny. Dellia Nims, RPVI [facility] Radiology Electronically Signed   By:  Corrie Mckusick D.O.   On: 08/19/2018 16:23   Ir Dialy Shunt Intro Needle/intracath Initial W/img Left  Result Date: 08/20/2018 INDICATION: Malfunction EXAM: AV FISTULAGRAM MEDICATIONS: None. ANESTHESIA/SEDATION: Moderate Sedation Time: The patient was continuously monitored during the procedure by the interventional radiology nurse under my direct supervision. FLUOROSCOPY TIME:  Fluoroscopy Time:  minutes 12 seconds (28 mGy). COMPLICATIONS: None immediate. PROCEDURE: Informed written consent was obtained from the patient after a thorough discussion of the procedural risks, benefits and alternatives. All questions were addressed. Maximal Sterile Barrier Technique was utilized including caps, mask, sterile gowns, sterile gloves, sterile drape, hand hygiene and skin antiseptic. A timeout was performed prior to the initiation of the procedure. The left arm was prepped and draped in a sterile fashion. An 18 gauge Angiocath was inserted into the outflow vein. Contrast was injected. The Angiocath was removed and hemostasis was achieved with direct pressure. FINDINGS: The arteriovenous anastomosis in the antecubital fossa, outflow cephalic vein, and central venous structures are widely patent. IMPRESSION: Left arm AV fistula circuit is patent. ACCESS: This access remains amenable to future percutaneous interventions as clinically indicated. Electronically Signed   By: Marybelle Killings M.D.   On: 08/20/2018 14:13    Micro Results    Recent Results (from the past 240 hour(s))  Surgical pcr screen     Status: None   Collection Time: 08/17/18  8:59 PM  Result Value Ref Range Status   MRSA, PCR NEGATIVE NEGATIVE Final   Staphylococcus aureus NEGATIVE NEGATIVE Final    Comment: (NOTE) The Xpert SA Assay (FDA approved for NASAL specimens in patients 29 years of age and older), is one component of a comprehensive surveillance program. It is not intended to diagnose infection nor to guide or monitor  treatment. Performed at [facility], [place] [address], [place], [place] [address]   Aerobic/Anaerobic Culture (surgical/deep wound)     Status: None (Preliminary result)   Collection Time: 08/19/18  4:20 PM  Result Value Ref Range Status   Specimen Description BILE  Final   Special Requests NONE  Final   Gram Stain   Final    WBC PRESENT, PREDOMINANTLY MONONUCLEAR NO ORGANISMS SEEN    Culture   Final    NO GROWTH 4 DAYS NO ANAEROBES ISOLATED; CULTURE IN PROGRESS FOR 5 DAYS Performed at [facility], [place] [address] [place], [place] [address]    Report Status PENDING  Incomplete  Today   Subjective:   Christian Dalton today has no headache,no chest or abdominal pain feels much better wants to go home today.   Objective:   Blood pressure 122/79, pulse 98, temperature 98.1 F (36.7 C), temperature source Oral, resp. rate 20, height '5\' 10"'  (1.778 m), weight 116.2 kg, SpO2 93 %.   Intake/Output Summary (Last 24 hours) at 08/24/2018 1328 Last data filed at 08/24/2018 1204 Gross per 24 hour  Intake 375 ml  Output 2880 ml  Net -2505 ml    Exam Awake Alert, Oriented x 3, No new F.N deficits, Normal affect Symmetrical Chest wall movement, Good air movement bilaterally, CTAB RRR,No Gallops,Rubs or new Murmurs, No Parasternal Heave +ve B.Sounds, Abd Soft, right upper quadrant drain with dark color Curiel in the bag, no rebound -guarding or rigidity. No Cyanosis, Clubbing or edema, No new Rash or bruise  Data Review   CBC w Diff:  Lab Results  Component Value Date   WBC 9.4 08/24/2018   HGB 14.7 08/24/2018   HCT 44.6 08/24/2018   PLT 276 08/24/2018   LYMPHOPCT 18 10/05/2017   MONOPCT 7 10/05/2017   EOSPCT 2 10/05/2017   BASOPCT 0 10/05/2017    CMP:  Lab Results  Component Value Date   NA 130 (L) 08/24/2018   K 4.1 08/24/2018   CL 89 (L) 08/24/2018   CO2 21 (L) 08/24/2018   BUN 110 (H) 08/24/2018   CREATININE 9.32 (H) 08/24/2018   PROT 6.2 (L)  08/22/2018   ALBUMIN 2.5 (L) 08/24/2018   BILITOT 1.8 (H) 08/22/2018   ALKPHOS 295 (H) 08/22/2018   AST 34 08/22/2018   ALT 88 (H) 08/22/2018  .   Total Time in preparing paper work, data evaluation and todays exam - 52 minutes  Phillips Climes M.D on 08/24/2018 at 1:28 PM  [facility]   Office  [phone]

## 2018-08-24 NOTE — Care Management Note (Signed)
Case Management Note  Patient Details  Name: Christian Dalton MRN: [id number] Date of Birth: 1941-10-09  Subjective/Objective:      Acute cholecystitis, hx of ESRD on hemodialysis, diabetes, and hypertension. From home with wife, [name].          Sabrina Arriaga (Spouse) Andrez Lieurance [phone]     PCP: Christa See  Action/Plan: Transition to home with home health services to follow.  Expected Discharge Date:  08/24/18               Expected Discharge Plan:  [place]  In-House Referral:     Discharge planning Services  CM Consult  Post Acute Care Choice:    Choice offered to:  Patient  DME Arranged:  N/A DME Agency:  NA  HH Arranged:  RN, PT [place] Agency:  [place]  Status of Service:  Completed, signed off  If discussed at [place] of Stay Meetings, dates discussed:    Additional Comments:  Sharin Mons, RN 08/24/2018, 2:32 PM

## 2018-08-24 NOTE — Discharge Instructions (Signed)
Follow with Primary MD Street, Sharon Mt, MD in 7 days   Get CBC, CMP,checked  by Primary MD next visit.    Activity: As tolerated with Full fall precautions use walker/cane & assistance as needed   Disposition Home    Diet: Renal, carb modified with 1200 cc fluid restriction, low-fat For Heart failure patients - Check your Weight same time everyday, if you gain over 2 pounds, or you develop in leg swelling, experience more shortness of breath or chest pain, call your Primary MD immediately. Follow Cardiac Low Salt Diet and 1.5 lit/day fluid restriction.   On your next visit with your primary care physician please Get Medicines reviewed and adjusted.   Please request your Prim.MD to go over all Hospital Tests and Procedure/Radiological results at the follow up, please get all Hospital records sent to your Prim MD by signing hospital release before you go home.   If you experience worsening of your admission symptoms, develop shortness of breath, life threatening emergency, suicidal or homicidal thoughts you must seek medical attention immediately by calling 911 or calling your MD immediately  if symptoms less severe.  You Must read complete instructions/literature along with all the possible adverse reactions/side effects for all the Medicines you take and that have been prescribed to you. Take any new Medicines after you have completely understood and accpet all the possible adverse reactions/side effects.   Do not drive, operating heavy machinery, perform activities at heights, swimming or participation in water activities or provide baby sitting services if your were admitted for syncope or siezures until you have seen by Primary MD or a Neurologist and advised to do so again.  Do not drive when taking Pain medications.    Do not take more than prescribed Pain, Sleep and Anxiety Medications  Special Instructions: If you have smoked or chewed Tobacco  in the last 2 yrs please  stop smoking, stop any regular Alcohol  and or any Recreational drug use.  Wear Seat belts while driving.   Please note  You were cared for by a hospitalist during your hospital stay. If you have any questions about your discharge medications or the care you received while you were in the hospital after you are discharged, you can call the unit and asked to speak with the hospitalist on call if the hospitalist that took care of you is not available. Once you are discharged, your primary care physician will handle any further medical issues. Please note that NO REFILLS for any discharge medications will be authorized once you are discharged, as it is imperative that you return to your primary care physician (or establish a relationship with a primary care physician if you do not have one) for your aftercare needs so that they can reassess your need for medications and monitor your lab values.

## 2018-08-24 NOTE — Plan of Care (Signed)
  Problem: Education: Goal: Knowledge of General Education information will improve Description Including pain rating scale, medication(s)/side effects and non-pharmacologic comfort measures Outcome: Progressing   Problem: Health Behavior/Discharge Planning: Goal: Ability to manage health-related needs will improve Outcome: Progressing   Problem: Clinical Measurements: Goal: Ability to maintain clinical measurements within normal limits will improve Outcome: Progressing Goal: Will remain free from infection Outcome: Progressing Goal: Diagnostic test results will improve Outcome: Progressing Goal: Respiratory complications will improve Outcome: Progressing Goal: Cardiovascular complication will be avoided Outcome: Progressing   Problem: Nutrition: Goal: Adequate nutrition will be maintained Outcome: Progressing   Problem: Pain Managment: Goal: General experience of comfort will improve Outcome: Progressing   Problem: Safety: Goal: Ability to remain free from injury will improve Outcome: Progressing   Problem: Education: Goal: Knowledge of disease and its progression will improve Outcome: Progressing Goal: Individualized Educational Video(s) Outcome: Progressing   Problem: Fluid Volume: Goal: Compliance with measures to maintain balanced fluid volume will improve Outcome: Progressing   Problem: Health Behavior/Discharge Planning: Goal: Ability to manage health-related needs will improve Outcome: Progressing   Problem: Nutritional: Goal: Ability to make healthy dietary choices will improve Outcome: Progressing   Problem: Clinical Measurements: Goal: Complications related to the disease process, condition or treatment will be avoided or minimized Outcome: Progressing  Patient rested well throughout shift, denies pain with assess.  Pending dialysis intervention today.  Stable condition, will continue to monitor.

## 2018-08-24 NOTE — Progress Notes (Signed)
Referring Physician(s): Dr. Thereasa Solo  Supervising Physician: Sandi Mariscal  Patient Status:  [facility] - In-pt  Chief Complaint: Follow-up cholecystostomy  Subjective:  77 y/o M with PMH significant for DM2, HTN, CAD, ESRD on dialysis and most recently sepsis due to acute cholecystitis. Patient presented to [facility] with complaints of epigastric pain - CT chest/abdomen showed change suggestive of acute cholecystitis/gallstones. He was transferred to [facility] on 9/24 for further evaluation by general surgery who ultimately felt patient was not a good candidate for surgery, GI consulted and MRCP was performed 9/25 which showed cholelithiasis with 1.7 cm gallstone and gallbladder neck, no biliary duct dilatation. IR was consulted for percutaneous cholecystostomy placement which was performed without complication onf 4/23 by Dr. Earleen Newport.  Patient initially sleeping during exam, arouses easily with light touch and asks when he can eat. Reports pain "all over" but unable to specify further. Patient somewhat of a poor historian today. RN states he has been improving lately.  Allergies: Avelox [moxifloxacin hcl]; Sulfa antibiotics; and Tetracyclines & related  Medications: Prior to Admission medications   Medication Sig Start Date End Date Taking? Authorizing Provider  aspirin EC 81 MG tablet Take 81 mg every evening by mouth.   Yes [provider]  atorvastatin (LIPITOR) 20 MG tablet Take 20 mg every evening by mouth. 01/29/15  Yes [provider]  buPROPion (WELLBUTRIN XL) 150 MG 24 hr tablet Take 150 mg daily by mouth.   Yes [provider]  docusate sodium (COLACE) 100 MG capsule Take 100 mg 3 (three) times daily by mouth.   Yes [provider]  escitalopram (LEXAPRO) 10 MG tablet Take 10 mg every evening by mouth. 08/30/15  Yes [provider]  HYDROcodone-acetaminophen (NORCO) 10-325 MG tablet Take 1 tablet every 8 (eight) hours as needed by mouth for  pain. 01/16/17  Yes [provider]  insulin detemir (LEVEMIR) 100 UNIT/ML injection Inject 50 Units 2 (two) times daily into the skin.   Yes [provider]  insulin lispro (HUMALOG) 100 UNIT/ML injection Inject 10 Units 2 (two) times daily into the skin.   Yes [provider]  lactulose, encephalopathy, (CHRONULAC) 10 GM/15ML SOLN Take 15-30 mLs by mouth daily before supper. 08/02/18  Yes [provider]  levothyroxine (SYNTHROID, LEVOTHROID) 125 MCG tablet Take 125 mcg daily by mouth.   Yes [provider]  lidocaine-prilocaine (EMLA) cream Apply 1 application topically See admin instructions. To access site (AVF) 1-2 hours before dialysis. Cover with occlusive dressing (Saran warp) 08/02/18  Yes [provider]  linaclotide (LINZESS) 145 MCG CAPS capsule Take 145 mcg daily after supper by mouth.   Yes [provider]  loratadine (CLARITIN) 10 MG tablet Take 10 mg every evening by mouth.   Yes [provider]  montelukast (SINGULAIR) 10 MG tablet Take 10 mg every evening by mouth.   Yes [provider]  oxyCODONE-acetaminophen (PERCOCET) 5-325 MG tablet Take 1-2 tablets by mouth every 6 (six) hours as needed. Patient taking differently: Take 1-2 tablets by mouth every 6 (six) hours as needed for moderate pain.  05/16/16  Yes Delo, Nathaneil Canary, MD  pregabalin (LYRICA) 100 MG capsule Take 100 mg 2 (two) times daily by mouth. 07/23/15  Yes [provider]     Vital Signs: BP (!) 148/81 (BP Location: Right Arm)   Pulse 82   Temp 97.8 F (36.6 C) (Oral)   Resp 18   Ht 5\' 10"  (1.778 m)   Wt 260  lb 5.8 oz (118.1 kg)   SpO2 98%   BMI 37.36 kg/m   Physical Exam  Constitutional: No distress.  HENT:  Head: Normocephalic.  Cardiovascular: Normal rate, regular rhythm and normal heart sounds.  Pulmonary/Chest: Effort normal and breath sounds normal.  Abdominal: He exhibits distension.  RUQ drain with bloody output -  flushes/aspirates easily. Insertion site clean, dry, intact without bleeding or discharge.  Skin: Skin is warm and dry. He is not diaphoretic.  Bruising to lower abdomen  Nursing note and vitals reviewed.   Imaging: Ir Dialy Shunt Intro Needle/intracath Initial W/img Left  Result Date: 08/20/2018 INDICATION: Malfunction EXAM: AV FISTULAGRAM MEDICATIONS: None. ANESTHESIA/SEDATION: Moderate Sedation Time: The patient was continuously monitored during the procedure by the interventional radiology nurse under my direct supervision. FLUOROSCOPY TIME:  Fluoroscopy Time:  minutes 12 seconds (28 mGy). COMPLICATIONS: None immediate. PROCEDURE: Informed written consent was obtained from the patient after a thorough discussion of the procedural risks, benefits and alternatives. All questions were addressed. Maximal Sterile Barrier Technique was utilized including caps, mask, sterile gowns, sterile gloves, sterile drape, hand hygiene and skin antiseptic. A timeout was performed prior to the initiation of the procedure. The left arm was prepped and draped in a sterile fashion. An 18 gauge Angiocath was inserted into the outflow vein. Contrast was injected. The Angiocath was removed and hemostasis was achieved with direct pressure. FINDINGS: The arteriovenous anastomosis in the antecubital fossa, outflow cephalic vein, and central venous structures are widely patent. IMPRESSION: Left arm AV fistula circuit is patent. ACCESS: This access remains amenable to future percutaneous interventions as clinically indicated. Electronically Signed   By: Marybelle Killings M.D.   On: 08/20/2018 14:13    Labs:  CBC: Recent Labs    08/20/18 0416 08/21/18 0313 08/22/18 0350 08/23/18 0516  WBC 14.6* 10.7* 8.5 8.3  HGB 16.0 15.0 16.5 14.3  HCT 48.8 45.4 49.6 43.4  PLT 169 156 PLATELET CLUMPS NOTED ON SMEAR, COUNT APPEARS DECREASED 199    COAGS: Recent Labs    08/19/18 1231  INR 1.22    BMP: Recent Labs     08/20/18 0419 08/21/18 0313 08/22/18 0350 08/23/18 0516  NA 137 132* 132* 130*  K 5.6* 4.7 4.3 4.0  CL 95* 88* 91* 90*  CO2 23 22 21* 21*  GLUCOSE 280* 179* 154* 187*  BUN 63* 93* 65* 93*  CALCIUM 9.7 9.5 8.6* 8.7*  CREATININE 6.82* 7.98* 6.27* 7.85*  GFRNONAA 7* 6* 8* 6*  GFRAA 8* 7* 9* 7*    LIVER FUNCTION TESTS: Recent Labs    08/18/18 1423 08/19/18 0433 08/20/18 0419 08/21/18 0313 08/22/18 0350  BILITOT 5.0* 4.5*  --  1.7* 1.8*  AST 206* 91*  --  23 34  ALT 416* 278*  --  115* 88*  ALKPHOS 332* 330*  --  284* 295*  PROT 6.3* 6.4*  --  6.5 6.2*  ALBUMIN 2.9* 2.7* 2.6* 2.4* 2.5*    Assessment and Plan:  Cholecystostomy placed 9/26 by Dr. Earleen Newport - continues with bloody output, 30 cc record in last 24 hours. Patient reports pain all over but is unable to elaborate secondary to mental status changes - nurse reports he has been doing well, no vomiting she is aware of.   WBC improved to 8.3, he is afebrile, AST/ALT improving. Drain flushes/aspirates well.  Will continue to follow while inpatient - will placed orders for outpatient follow up 6-8 weeks from d/c. Continue TID flushes with 5 cc, record output.  Discharge per admitting service.  Please call IR with questions or concerns.   Electronically Signed: Joaquim Nam, PA-C 08/24/2018, 8:47 AM   I spent a total of 25 Minutes at the the patient's bedside AND on the patient's hospital floor or unit, greater than 50% of which was counseling/coordinating care for cholecystostomy.

## 2018-08-24 NOTE — Progress Notes (Addendum)
[facility] Progress Note   Subjective:  Seen on HD. More alert today. Says only doing 3 hours of HD d/t back hurting  Tolerating diet. Says for discharge today    Objective Vitals:   08/24/18 0904 08/24/18 0915 08/24/18 0945 08/24/18 1015  BP: 126/74 132/68 118/66 (!) 105/55  Pulse: 84 82 89 93  Resp: 18 17    Temp: 98.3 F (36.8 C)     TempSrc: Oral     SpO2: 98%  98%   Weight: 118.6 kg     Height:       Physical Exam General: Obese male NAD  Heart: RRR  Lungs: CTAB Abdomen: obese chole tube with bloody drainage Extremities: No LE edema  Dialysis Access: LUE AVF +bruit   Dialysis:  [facility] 4h 500/800 EDW 118kg 2K/2.25Ca L AVF Heparin bolus 7000 U Calcitriol 1.0 mcg TIW Auyrxia 3 tabs qactid  Assessment/Plan: 1. Acute cholecystitis - s/p IR cholecystostomy drain 9/26. On Augmentin- changed to renal appropriate dosing.  LFTs/WBCs improving. Conservative treatment so far. Per primary/Surgery.  2. ESRD- TTS - HD today on schedule.  3. Anemia- Hgb >13. Stop Ferric based binders, No ESA.  4. Secondary hyperparathyroidism - P >10  Auryxia D/c'd d/t high hgb. Renvela 2400 mg PO TID started. Will need to titrate binders up further as outpatient 5.BP/volume- no evidence of volume overload by exam. BP controlled. Pre HD wt 118.6kg  6.Nutrition -  Renal carb mod/renavite/prostat for low albumin  7. Hyperkalemia- Resolved. Fistulagram done 9/27 -negative. Uses 2.0 K bath at [place].  8. DM -per primary  Lynnda Child PA-C [place] Pager [phone] 08/24/2018,11:09 AM  LOS: 7 days   Pt seen, examined and agree w A/P as above.  [name] MD [facility] pager [phone]   08/24/2018, 12:46 PM    Additional Objective Labs: Basic Metabolic Panel: Recent Labs  Lab 08/21/18 0313  08/23/18 0516 08/24/18 0829 08/24/18 1020  NA 132*   < > 130* 132* 130*  K 4.7   < > 4.0 4.0 4.1  CL 88*   < > 90*  89* 89*  CO2 22   < > 21* 24 21*  GLUCOSE 179*   < > 187* 169* 191*  BUN 93*   < > 93* 109* 110*  CREATININE 7.98*   < > 7.85* 9.50* 9.32*  CALCIUM 9.5   < > 8.7* 8.8* 8.4*  PHOS 9.7*  --   --  10.3* 10.2*   < > = values in this interval not displayed.   CBC: Recent Labs  Lab 08/20/18 0416 08/21/18 0313 08/22/18 0350 08/23/18 0516 08/24/18 0829  WBC 14.6* 10.7* 8.5 8.3 9.4  HGB 16.0 15.0 16.5 14.3 14.7  HCT 48.8 45.4 49.6 43.4 44.6  MCV 94.6 92.1 91.9 94.1 92.5  PLT 169 156 PLATELET CLUMPS NOTED ON SMEAR, COUNT APPEARS DECREASED 199 276   Blood Culture    Component Value Date/Time   SDES BILE 08/19/2018 1620   SPECREQUEST NONE 08/19/2018 1620   CULT  08/19/2018 1620    NO GROWTH 4 DAYS NO ANAEROBES ISOLATED; CULTURE IN PROGRESS FOR 5 DAYS Performed at [facility] [address] [place], [place] [address]    REPTSTATUS PENDING 08/19/2018 1620    Cardiac Enzymes: No results for input(s): CKTOTAL, CKMB, CKMBINDEX, TROPONINI in the last 168 hours. CBG: Recent Labs  Lab 08/23/18 0751 08/23/18 1126 08/23/18 1702 08/23/18 2234 08/24/18 0749  GLUCAP 175*  235* 179* 211* 175*   Iron Studies: No results for input(s): IRON, TIBC, TRANSFERRIN, FERRITIN in the last 72 hours. Lab Results  Component Value Date   INR 1.22 08/19/2018   Medications: . sodium chloride    . sodium chloride    . sodium chloride    . sodium chloride     . amoxicillin-clavulanate  1 tablet Oral Daily  . calcitRIOL  1 mcg Oral Q T,Th,Sa-HD  . Chlorhexidine Gluconate Cloth  6 each Topical Q0600  . escitalopram  10 mg Oral QPM  . feeding supplement (PRO-STAT SUGAR FREE 64)  30 mL Oral BID  . heparin injection (subcutaneous)  5,000 Units Subcutaneous Q8H  . insulin aspart  0-9 Units Subcutaneous TID WC  . insulin detemir  10 Units Subcutaneous BID  . levothyroxine  125 mcg Oral QAC breakfast  . montelukast  10 mg Oral QPM  . multivitamin  1 tablet Oral QHS  . sevelamer carbonate   2,400 mg Oral TID WC  . sodium chloride flush  5 mL Intracatheter Q8H

## 2018-08-24 NOTE — Progress Notes (Signed)
OT Cancellation Note  Patient Details Name: Christian [place] MRN: [id number] DOB: [birth year]   Cancelled Treatment:     Reason treatment not complete/pt not seen: Pt is currently off the floor at HD. Will check back as schedule permits for occupational therapy.   Carlynn Herald Avalina Benko Beth Dixon, OTR/L 08/24/2018, 11:52 AM

## 2018-08-25 ENCOUNTER — Other Ambulatory Visit: Payer: Self-pay | Admitting: General Surgery

## 2018-08-25 ENCOUNTER — Other Ambulatory Visit (HOSPITAL_COMMUNITY): Payer: Self-pay | Admitting: Physician Assistant

## 2018-08-25 DIAGNOSIS — Z794 Long term (current) use of insulin: Secondary | ICD-10-CM | POA: Diagnosis not present

## 2018-08-25 DIAGNOSIS — N186 End stage renal disease: Secondary | ICD-10-CM | POA: Diagnosis not present

## 2018-08-25 DIAGNOSIS — K810 Acute cholecystitis: Secondary | ICD-10-CM | POA: Diagnosis not present

## 2018-08-25 DIAGNOSIS — M4800 Spinal stenosis, site unspecified: Secondary | ICD-10-CM | POA: Diagnosis not present

## 2018-08-25 DIAGNOSIS — G4733 Obstructive sleep apnea (adult) (pediatric): Secondary | ICD-10-CM | POA: Diagnosis not present

## 2018-08-25 DIAGNOSIS — E1122 Type 2 diabetes mellitus with diabetic chronic kidney disease: Secondary | ICD-10-CM | POA: Diagnosis not present

## 2018-08-25 DIAGNOSIS — A419 Sepsis, unspecified organism: Secondary | ICD-10-CM | POA: Diagnosis not present

## 2018-08-25 DIAGNOSIS — E669 Obesity, unspecified: Secondary | ICD-10-CM | POA: Diagnosis not present

## 2018-08-25 DIAGNOSIS — Z7982 Long term (current) use of aspirin: Secondary | ICD-10-CM | POA: Diagnosis not present

## 2018-08-25 DIAGNOSIS — I2510 Atherosclerotic heart disease of native coronary artery without angina pectoris: Secondary | ICD-10-CM | POA: Diagnosis not present

## 2018-08-25 DIAGNOSIS — Z992 Dependence on renal dialysis: Secondary | ICD-10-CM | POA: Diagnosis not present

## 2018-08-25 DIAGNOSIS — I120 Hypertensive chronic kidney disease with stage 5 chronic kidney disease or end stage renal disease: Secondary | ICD-10-CM | POA: Diagnosis not present

## 2018-08-25 DIAGNOSIS — J449 Chronic obstructive pulmonary disease, unspecified: Secondary | ICD-10-CM | POA: Diagnosis not present

## 2018-08-25 LAB — AEROBIC/ANAEROBIC CULTURE W GRAM STAIN (SURGICAL/DEEP WOUND): Culture: NO GROWTH

## 2018-08-26 DIAGNOSIS — K810 Acute cholecystitis: Secondary | ICD-10-CM | POA: Diagnosis not present

## 2018-08-26 DIAGNOSIS — E1121 Type 2 diabetes mellitus with diabetic nephropathy: Secondary | ICD-10-CM | POA: Diagnosis not present

## 2018-08-26 DIAGNOSIS — N186 End stage renal disease: Secondary | ICD-10-CM | POA: Diagnosis not present

## 2018-08-26 DIAGNOSIS — I120 Hypertensive chronic kidney disease with stage 5 chronic kidney disease or end stage renal disease: Secondary | ICD-10-CM | POA: Diagnosis not present

## 2018-08-26 DIAGNOSIS — A419 Sepsis, unspecified organism: Secondary | ICD-10-CM | POA: Diagnosis not present

## 2018-08-26 DIAGNOSIS — E1122 Type 2 diabetes mellitus with diabetic chronic kidney disease: Secondary | ICD-10-CM | POA: Diagnosis not present

## 2018-08-26 DIAGNOSIS — I2510 Atherosclerotic heart disease of native coronary artery without angina pectoris: Secondary | ICD-10-CM | POA: Diagnosis not present

## 2018-08-26 DIAGNOSIS — N2581 Secondary hyperparathyroidism of renal origin: Secondary | ICD-10-CM | POA: Diagnosis not present

## 2018-08-28 DIAGNOSIS — N186 End stage renal disease: Secondary | ICD-10-CM | POA: Diagnosis not present

## 2018-08-28 DIAGNOSIS — E1121 Type 2 diabetes mellitus with diabetic nephropathy: Secondary | ICD-10-CM | POA: Diagnosis not present

## 2018-08-28 DIAGNOSIS — N2581 Secondary hyperparathyroidism of renal origin: Secondary | ICD-10-CM | POA: Diagnosis not present

## 2018-08-30 DIAGNOSIS — K8010 Calculus of gallbladder with chronic cholecystitis without obstruction: Secondary | ICD-10-CM | POA: Diagnosis not present

## 2018-08-30 DIAGNOSIS — Z992 Dependence on renal dialysis: Secondary | ICD-10-CM | POA: Diagnosis not present

## 2018-08-30 DIAGNOSIS — K810 Acute cholecystitis: Secondary | ICD-10-CM | POA: Diagnosis not present

## 2018-08-30 DIAGNOSIS — I2510 Atherosclerotic heart disease of native coronary artery without angina pectoris: Secondary | ICD-10-CM | POA: Diagnosis not present

## 2018-08-30 DIAGNOSIS — I120 Hypertensive chronic kidney disease with stage 5 chronic kidney disease or end stage renal disease: Secondary | ICD-10-CM | POA: Diagnosis not present

## 2018-08-30 DIAGNOSIS — N186 End stage renal disease: Secondary | ICD-10-CM | POA: Diagnosis not present

## 2018-08-30 DIAGNOSIS — K862 Cyst of pancreas: Secondary | ICD-10-CM | POA: Diagnosis not present

## 2018-08-30 DIAGNOSIS — E1121 Type 2 diabetes mellitus with diabetic nephropathy: Secondary | ICD-10-CM | POA: Diagnosis not present

## 2018-08-30 DIAGNOSIS — A419 Sepsis, unspecified organism: Secondary | ICD-10-CM | POA: Diagnosis not present

## 2018-08-30 DIAGNOSIS — I739 Peripheral vascular disease, unspecified: Secondary | ICD-10-CM | POA: Diagnosis not present

## 2018-08-30 DIAGNOSIS — E1122 Type 2 diabetes mellitus with diabetic chronic kidney disease: Secondary | ICD-10-CM | POA: Diagnosis not present

## 2018-08-31 DIAGNOSIS — E1121 Type 2 diabetes mellitus with diabetic nephropathy: Secondary | ICD-10-CM | POA: Diagnosis not present

## 2018-08-31 DIAGNOSIS — N2581 Secondary hyperparathyroidism of renal origin: Secondary | ICD-10-CM | POA: Diagnosis not present

## 2018-08-31 DIAGNOSIS — I120 Hypertensive chronic kidney disease with stage 5 chronic kidney disease or end stage renal disease: Secondary | ICD-10-CM | POA: Diagnosis not present

## 2018-08-31 DIAGNOSIS — R58 Hemorrhage, not elsewhere classified: Secondary | ICD-10-CM | POA: Diagnosis not present

## 2018-08-31 DIAGNOSIS — N186 End stage renal disease: Secondary | ICD-10-CM | POA: Diagnosis not present

## 2018-08-31 DIAGNOSIS — E1122 Type 2 diabetes mellitus with diabetic chronic kidney disease: Secondary | ICD-10-CM | POA: Diagnosis not present

## 2018-08-31 DIAGNOSIS — Z992 Dependence on renal dialysis: Secondary | ICD-10-CM | POA: Diagnosis not present

## 2018-08-31 DIAGNOSIS — T82838A Hemorrhage of vascular prosthetic devices, implants and grafts, initial encounter: Secondary | ICD-10-CM | POA: Diagnosis not present

## 2018-09-01 DIAGNOSIS — A419 Sepsis, unspecified organism: Secondary | ICD-10-CM | POA: Diagnosis not present

## 2018-09-01 DIAGNOSIS — I120 Hypertensive chronic kidney disease with stage 5 chronic kidney disease or end stage renal disease: Secondary | ICD-10-CM | POA: Diagnosis not present

## 2018-09-01 DIAGNOSIS — I2510 Atherosclerotic heart disease of native coronary artery without angina pectoris: Secondary | ICD-10-CM | POA: Diagnosis not present

## 2018-09-01 DIAGNOSIS — N186 End stage renal disease: Secondary | ICD-10-CM | POA: Diagnosis not present

## 2018-09-01 DIAGNOSIS — E1122 Type 2 diabetes mellitus with diabetic chronic kidney disease: Secondary | ICD-10-CM | POA: Diagnosis not present

## 2018-09-01 DIAGNOSIS — K810 Acute cholecystitis: Secondary | ICD-10-CM | POA: Diagnosis not present

## 2018-09-02 DIAGNOSIS — N186 End stage renal disease: Secondary | ICD-10-CM | POA: Diagnosis not present

## 2018-09-02 DIAGNOSIS — E1121 Type 2 diabetes mellitus with diabetic nephropathy: Secondary | ICD-10-CM | POA: Diagnosis not present

## 2018-09-02 DIAGNOSIS — N2581 Secondary hyperparathyroidism of renal origin: Secondary | ICD-10-CM | POA: Diagnosis not present

## 2018-09-04 DIAGNOSIS — N186 End stage renal disease: Secondary | ICD-10-CM | POA: Diagnosis not present

## 2018-09-04 DIAGNOSIS — E1121 Type 2 diabetes mellitus with diabetic nephropathy: Secondary | ICD-10-CM | POA: Diagnosis not present

## 2018-09-04 DIAGNOSIS — N2581 Secondary hyperparathyroidism of renal origin: Secondary | ICD-10-CM | POA: Diagnosis not present

## 2018-09-06 ENCOUNTER — Other Ambulatory Visit: Payer: Self-pay

## 2018-09-06 DIAGNOSIS — I2510 Atherosclerotic heart disease of native coronary artery without angina pectoris: Secondary | ICD-10-CM | POA: Diagnosis not present

## 2018-09-06 DIAGNOSIS — N186 End stage renal disease: Secondary | ICD-10-CM | POA: Diagnosis not present

## 2018-09-06 DIAGNOSIS — E1122 Type 2 diabetes mellitus with diabetic chronic kidney disease: Secondary | ICD-10-CM | POA: Diagnosis not present

## 2018-09-06 DIAGNOSIS — I739 Peripheral vascular disease, unspecified: Secondary | ICD-10-CM

## 2018-09-06 DIAGNOSIS — A419 Sepsis, unspecified organism: Secondary | ICD-10-CM | POA: Diagnosis not present

## 2018-09-06 DIAGNOSIS — I120 Hypertensive chronic kidney disease with stage 5 chronic kidney disease or end stage renal disease: Secondary | ICD-10-CM | POA: Diagnosis not present

## 2018-09-06 DIAGNOSIS — K810 Acute cholecystitis: Secondary | ICD-10-CM | POA: Diagnosis not present

## 2018-09-07 DIAGNOSIS — N2581 Secondary hyperparathyroidism of renal origin: Secondary | ICD-10-CM | POA: Diagnosis not present

## 2018-09-07 DIAGNOSIS — E1121 Type 2 diabetes mellitus with diabetic nephropathy: Secondary | ICD-10-CM | POA: Diagnosis not present

## 2018-09-07 DIAGNOSIS — N186 End stage renal disease: Secondary | ICD-10-CM | POA: Diagnosis not present

## 2018-09-08 DIAGNOSIS — K810 Acute cholecystitis: Secondary | ICD-10-CM | POA: Diagnosis not present

## 2018-09-08 DIAGNOSIS — A419 Sepsis, unspecified organism: Secondary | ICD-10-CM | POA: Diagnosis not present

## 2018-09-08 DIAGNOSIS — N186 End stage renal disease: Secondary | ICD-10-CM | POA: Diagnosis not present

## 2018-09-08 DIAGNOSIS — I2510 Atherosclerotic heart disease of native coronary artery without angina pectoris: Secondary | ICD-10-CM | POA: Diagnosis not present

## 2018-09-08 DIAGNOSIS — E1122 Type 2 diabetes mellitus with diabetic chronic kidney disease: Secondary | ICD-10-CM | POA: Diagnosis not present

## 2018-09-08 DIAGNOSIS — I120 Hypertensive chronic kidney disease with stage 5 chronic kidney disease or end stage renal disease: Secondary | ICD-10-CM | POA: Diagnosis not present

## 2018-09-11 DIAGNOSIS — N2581 Secondary hyperparathyroidism of renal origin: Secondary | ICD-10-CM | POA: Diagnosis not present

## 2018-09-11 DIAGNOSIS — N186 End stage renal disease: Secondary | ICD-10-CM | POA: Diagnosis not present

## 2018-09-11 DIAGNOSIS — E1121 Type 2 diabetes mellitus with diabetic nephropathy: Secondary | ICD-10-CM | POA: Diagnosis not present

## 2018-09-13 DIAGNOSIS — K810 Acute cholecystitis: Secondary | ICD-10-CM | POA: Diagnosis not present

## 2018-09-13 DIAGNOSIS — I2510 Atherosclerotic heart disease of native coronary artery without angina pectoris: Secondary | ICD-10-CM | POA: Diagnosis not present

## 2018-09-13 DIAGNOSIS — N186 End stage renal disease: Secondary | ICD-10-CM | POA: Diagnosis not present

## 2018-09-13 DIAGNOSIS — I120 Hypertensive chronic kidney disease with stage 5 chronic kidney disease or end stage renal disease: Secondary | ICD-10-CM | POA: Diagnosis not present

## 2018-09-13 DIAGNOSIS — A419 Sepsis, unspecified organism: Secondary | ICD-10-CM | POA: Diagnosis not present

## 2018-09-13 DIAGNOSIS — E1122 Type 2 diabetes mellitus with diabetic chronic kidney disease: Secondary | ICD-10-CM | POA: Diagnosis not present

## 2018-09-14 DIAGNOSIS — N186 End stage renal disease: Secondary | ICD-10-CM | POA: Diagnosis not present

## 2018-09-14 DIAGNOSIS — E1121 Type 2 diabetes mellitus with diabetic nephropathy: Secondary | ICD-10-CM | POA: Diagnosis not present

## 2018-09-14 DIAGNOSIS — N2581 Secondary hyperparathyroidism of renal origin: Secondary | ICD-10-CM | POA: Diagnosis not present

## 2018-09-15 DIAGNOSIS — K819 Cholecystitis, unspecified: Secondary | ICD-10-CM | POA: Diagnosis not present

## 2018-09-16 DIAGNOSIS — N2581 Secondary hyperparathyroidism of renal origin: Secondary | ICD-10-CM | POA: Diagnosis not present

## 2018-09-16 DIAGNOSIS — E1121 Type 2 diabetes mellitus with diabetic nephropathy: Secondary | ICD-10-CM | POA: Diagnosis not present

## 2018-09-16 DIAGNOSIS — N186 End stage renal disease: Secondary | ICD-10-CM | POA: Diagnosis not present

## 2018-09-17 DIAGNOSIS — K810 Acute cholecystitis: Secondary | ICD-10-CM | POA: Diagnosis not present

## 2018-09-17 DIAGNOSIS — I2510 Atherosclerotic heart disease of native coronary artery without angina pectoris: Secondary | ICD-10-CM | POA: Diagnosis not present

## 2018-09-17 DIAGNOSIS — N186 End stage renal disease: Secondary | ICD-10-CM | POA: Diagnosis not present

## 2018-09-17 DIAGNOSIS — I120 Hypertensive chronic kidney disease with stage 5 chronic kidney disease or end stage renal disease: Secondary | ICD-10-CM | POA: Diagnosis not present

## 2018-09-17 DIAGNOSIS — E1122 Type 2 diabetes mellitus with diabetic chronic kidney disease: Secondary | ICD-10-CM | POA: Diagnosis not present

## 2018-09-17 DIAGNOSIS — A419 Sepsis, unspecified organism: Secondary | ICD-10-CM | POA: Diagnosis not present

## 2018-09-18 DIAGNOSIS — E1121 Type 2 diabetes mellitus with diabetic nephropathy: Secondary | ICD-10-CM | POA: Diagnosis not present

## 2018-09-18 DIAGNOSIS — I959 Hypotension, unspecified: Secondary | ICD-10-CM | POA: Diagnosis not present

## 2018-09-18 DIAGNOSIS — Z992 Dependence on renal dialysis: Secondary | ICD-10-CM | POA: Diagnosis not present

## 2018-09-18 DIAGNOSIS — J449 Chronic obstructive pulmonary disease, unspecified: Secondary | ICD-10-CM | POA: Diagnosis not present

## 2018-09-18 DIAGNOSIS — I120 Hypertensive chronic kidney disease with stage 5 chronic kidney disease or end stage renal disease: Secondary | ICD-10-CM | POA: Diagnosis not present

## 2018-09-18 DIAGNOSIS — R079 Chest pain, unspecified: Secondary | ICD-10-CM | POA: Diagnosis not present

## 2018-09-18 DIAGNOSIS — N186 End stage renal disease: Secondary | ICD-10-CM | POA: Diagnosis not present

## 2018-09-18 DIAGNOSIS — N2581 Secondary hyperparathyroidism of renal origin: Secondary | ICD-10-CM | POA: Diagnosis not present

## 2018-09-18 DIAGNOSIS — E1122 Type 2 diabetes mellitus with diabetic chronic kidney disease: Secondary | ICD-10-CM | POA: Diagnosis not present

## 2018-09-20 DIAGNOSIS — E1121 Type 2 diabetes mellitus with diabetic nephropathy: Secondary | ICD-10-CM | POA: Diagnosis not present

## 2018-09-20 DIAGNOSIS — K8010 Calculus of gallbladder with chronic cholecystitis without obstruction: Secondary | ICD-10-CM | POA: Diagnosis not present

## 2018-09-20 DIAGNOSIS — K5909 Other constipation: Secondary | ICD-10-CM | POA: Diagnosis not present

## 2018-09-20 DIAGNOSIS — E1142 Type 2 diabetes mellitus with diabetic polyneuropathy: Secondary | ICD-10-CM | POA: Diagnosis not present

## 2018-09-21 DIAGNOSIS — N2581 Secondary hyperparathyroidism of renal origin: Secondary | ICD-10-CM | POA: Diagnosis not present

## 2018-09-21 DIAGNOSIS — N186 End stage renal disease: Secondary | ICD-10-CM | POA: Diagnosis not present

## 2018-09-21 DIAGNOSIS — E1121 Type 2 diabetes mellitus with diabetic nephropathy: Secondary | ICD-10-CM | POA: Diagnosis not present

## 2018-09-22 DIAGNOSIS — K810 Acute cholecystitis: Secondary | ICD-10-CM | POA: Diagnosis not present

## 2018-09-22 DIAGNOSIS — N186 End stage renal disease: Secondary | ICD-10-CM | POA: Diagnosis not present

## 2018-09-22 DIAGNOSIS — A419 Sepsis, unspecified organism: Secondary | ICD-10-CM | POA: Diagnosis not present

## 2018-09-22 DIAGNOSIS — I2510 Atherosclerotic heart disease of native coronary artery without angina pectoris: Secondary | ICD-10-CM | POA: Diagnosis not present

## 2018-09-22 DIAGNOSIS — I120 Hypertensive chronic kidney disease with stage 5 chronic kidney disease or end stage renal disease: Secondary | ICD-10-CM | POA: Diagnosis not present

## 2018-09-22 DIAGNOSIS — E1122 Type 2 diabetes mellitus with diabetic chronic kidney disease: Secondary | ICD-10-CM | POA: Diagnosis not present

## 2018-09-23 DIAGNOSIS — N2581 Secondary hyperparathyroidism of renal origin: Secondary | ICD-10-CM | POA: Diagnosis not present

## 2018-09-23 DIAGNOSIS — N186 End stage renal disease: Secondary | ICD-10-CM | POA: Diagnosis not present

## 2018-09-23 DIAGNOSIS — E1121 Type 2 diabetes mellitus with diabetic nephropathy: Secondary | ICD-10-CM | POA: Diagnosis not present

## 2018-09-24 DIAGNOSIS — I2510 Atherosclerotic heart disease of native coronary artery without angina pectoris: Secondary | ICD-10-CM | POA: Diagnosis not present

## 2018-09-24 DIAGNOSIS — D631 Anemia in chronic kidney disease: Secondary | ICD-10-CM | POA: Diagnosis not present

## 2018-09-24 DIAGNOSIS — D509 Iron deficiency anemia, unspecified: Secondary | ICD-10-CM | POA: Diagnosis not present

## 2018-09-24 DIAGNOSIS — I120 Hypertensive chronic kidney disease with stage 5 chronic kidney disease or end stage renal disease: Secondary | ICD-10-CM | POA: Diagnosis not present

## 2018-09-24 DIAGNOSIS — E1122 Type 2 diabetes mellitus with diabetic chronic kidney disease: Secondary | ICD-10-CM | POA: Diagnosis not present

## 2018-09-24 DIAGNOSIS — I447 Left bundle-branch block, unspecified: Secondary | ICD-10-CM | POA: Diagnosis not present

## 2018-09-24 DIAGNOSIS — A419 Sepsis, unspecified organism: Secondary | ICD-10-CM | POA: Diagnosis not present

## 2018-09-24 DIAGNOSIS — N2581 Secondary hyperparathyroidism of renal origin: Secondary | ICD-10-CM | POA: Diagnosis not present

## 2018-09-24 DIAGNOSIS — Z992 Dependence on renal dialysis: Secondary | ICD-10-CM | POA: Diagnosis not present

## 2018-09-24 DIAGNOSIS — K810 Acute cholecystitis: Secondary | ICD-10-CM | POA: Diagnosis not present

## 2018-09-24 DIAGNOSIS — R0602 Shortness of breath: Secondary | ICD-10-CM | POA: Diagnosis not present

## 2018-09-24 DIAGNOSIS — N186 End stage renal disease: Secondary | ICD-10-CM | POA: Diagnosis not present

## 2018-09-25 DIAGNOSIS — D631 Anemia in chronic kidney disease: Secondary | ICD-10-CM | POA: Diagnosis not present

## 2018-09-25 DIAGNOSIS — N2581 Secondary hyperparathyroidism of renal origin: Secondary | ICD-10-CM | POA: Diagnosis not present

## 2018-09-25 DIAGNOSIS — D509 Iron deficiency anemia, unspecified: Secondary | ICD-10-CM | POA: Diagnosis not present

## 2018-09-25 DIAGNOSIS — N186 End stage renal disease: Secondary | ICD-10-CM | POA: Diagnosis not present

## 2018-09-27 DIAGNOSIS — A419 Sepsis, unspecified organism: Secondary | ICD-10-CM | POA: Diagnosis not present

## 2018-09-27 DIAGNOSIS — N186 End stage renal disease: Secondary | ICD-10-CM | POA: Diagnosis not present

## 2018-09-27 DIAGNOSIS — K810 Acute cholecystitis: Secondary | ICD-10-CM | POA: Diagnosis not present

## 2018-09-27 DIAGNOSIS — E1122 Type 2 diabetes mellitus with diabetic chronic kidney disease: Secondary | ICD-10-CM | POA: Diagnosis not present

## 2018-09-27 DIAGNOSIS — I120 Hypertensive chronic kidney disease with stage 5 chronic kidney disease or end stage renal disease: Secondary | ICD-10-CM | POA: Diagnosis not present

## 2018-09-27 DIAGNOSIS — I2510 Atherosclerotic heart disease of native coronary artery without angina pectoris: Secondary | ICD-10-CM | POA: Diagnosis not present

## 2018-09-28 DIAGNOSIS — N186 End stage renal disease: Secondary | ICD-10-CM | POA: Diagnosis not present

## 2018-09-28 DIAGNOSIS — D509 Iron deficiency anemia, unspecified: Secondary | ICD-10-CM | POA: Diagnosis not present

## 2018-09-28 DIAGNOSIS — N2581 Secondary hyperparathyroidism of renal origin: Secondary | ICD-10-CM | POA: Diagnosis not present

## 2018-09-28 DIAGNOSIS — D631 Anemia in chronic kidney disease: Secondary | ICD-10-CM | POA: Diagnosis not present

## 2018-09-29 ENCOUNTER — Ambulatory Visit
Admission: RE | Admit: 2018-09-29 | Discharge: 2018-09-29 | Disposition: A | Discharge status: Home / Self Care | Payer: Medicare Other | Source: Ambulatory Visit | Attending: Physician Assistant | Admitting: Physician Assistant

## 2018-09-29 DIAGNOSIS — K810 Acute cholecystitis: Secondary | ICD-10-CM

## 2018-09-29 HISTORY — PX: IR RADIOLOGIST EVAL & MGMT: IMG5224

## 2018-09-29 NOTE — Progress Notes (Signed)
Interventional Radiology Progress Note  77 yo male with history of acute cholecystitis. Perc chole was performed 08/19/2018.   Dr. Georganna Skeans saw him as consult.  Dr. Alessandra Bevels saw him as consult.   He was discharged 08/24/2018.   He returns today for drain injection.  He is currently asymptomatic, with no fevers, rigors, chills, abdominal pain.   He has upcoming appointment with surgery, as well as appointment with GI the first week of December.   Drain was injected. Results in the imaging record.    The injection shows cholelithiasis, with questionable patency of the distal CBD.  Either ERCP or MRCP may be useful.   Drain re-attached to gravity.    Signed,  Dulcy Fanny. Earleen Newport, DO

## 2018-09-30 DIAGNOSIS — D631 Anemia in chronic kidney disease: Secondary | ICD-10-CM | POA: Diagnosis not present

## 2018-09-30 DIAGNOSIS — D509 Iron deficiency anemia, unspecified: Secondary | ICD-10-CM | POA: Diagnosis not present

## 2018-09-30 DIAGNOSIS — N186 End stage renal disease: Secondary | ICD-10-CM | POA: Diagnosis not present

## 2018-09-30 DIAGNOSIS — N2581 Secondary hyperparathyroidism of renal origin: Secondary | ICD-10-CM | POA: Diagnosis not present

## 2018-10-02 DIAGNOSIS — Z79899 Other long term (current) drug therapy: Secondary | ICD-10-CM | POA: Diagnosis not present

## 2018-10-02 DIAGNOSIS — N186 End stage renal disease: Secondary | ICD-10-CM | POA: Diagnosis not present

## 2018-10-02 DIAGNOSIS — D509 Iron deficiency anemia, unspecified: Secondary | ICD-10-CM | POA: Diagnosis not present

## 2018-10-02 DIAGNOSIS — I10 Essential (primary) hypertension: Secondary | ICD-10-CM | POA: Diagnosis not present

## 2018-10-02 DIAGNOSIS — N2581 Secondary hyperparathyroidism of renal origin: Secondary | ICD-10-CM | POA: Diagnosis not present

## 2018-10-02 DIAGNOSIS — Z794 Long term (current) use of insulin: Secondary | ICD-10-CM | POA: Diagnosis not present

## 2018-10-02 DIAGNOSIS — R1011 Right upper quadrant pain: Secondary | ICD-10-CM | POA: Diagnosis not present

## 2018-10-02 DIAGNOSIS — Z79891 Long term (current) use of opiate analgesic: Secondary | ICD-10-CM | POA: Diagnosis not present

## 2018-10-02 DIAGNOSIS — J449 Chronic obstructive pulmonary disease, unspecified: Secondary | ICD-10-CM | POA: Diagnosis not present

## 2018-10-02 DIAGNOSIS — E119 Type 2 diabetes mellitus without complications: Secondary | ICD-10-CM | POA: Diagnosis not present

## 2018-10-02 DIAGNOSIS — D631 Anemia in chronic kidney disease: Secondary | ICD-10-CM | POA: Diagnosis not present

## 2018-10-04 DIAGNOSIS — I447 Left bundle-branch block, unspecified: Secondary | ICD-10-CM | POA: Diagnosis not present

## 2018-10-04 DIAGNOSIS — R0602 Shortness of breath: Secondary | ICD-10-CM | POA: Diagnosis not present

## 2018-10-05 DIAGNOSIS — N186 End stage renal disease: Secondary | ICD-10-CM | POA: Diagnosis not present

## 2018-10-05 DIAGNOSIS — N2581 Secondary hyperparathyroidism of renal origin: Secondary | ICD-10-CM | POA: Diagnosis not present

## 2018-10-05 DIAGNOSIS — D631 Anemia in chronic kidney disease: Secondary | ICD-10-CM | POA: Diagnosis not present

## 2018-10-05 DIAGNOSIS — D509 Iron deficiency anemia, unspecified: Secondary | ICD-10-CM | POA: Diagnosis not present

## 2018-10-06 DIAGNOSIS — K819 Cholecystitis, unspecified: Secondary | ICD-10-CM | POA: Diagnosis not present

## 2018-10-07 DIAGNOSIS — D631 Anemia in chronic kidney disease: Secondary | ICD-10-CM | POA: Diagnosis not present

## 2018-10-07 DIAGNOSIS — N2581 Secondary hyperparathyroidism of renal origin: Secondary | ICD-10-CM | POA: Diagnosis not present

## 2018-10-07 DIAGNOSIS — N186 End stage renal disease: Secondary | ICD-10-CM | POA: Diagnosis not present

## 2018-10-07 DIAGNOSIS — D509 Iron deficiency anemia, unspecified: Secondary | ICD-10-CM | POA: Diagnosis not present

## 2018-10-08 ENCOUNTER — Other Ambulatory Visit: Payer: Self-pay

## 2018-10-08 DIAGNOSIS — I120 Hypertensive chronic kidney disease with stage 5 chronic kidney disease or end stage renal disease: Secondary | ICD-10-CM | POA: Diagnosis not present

## 2018-10-08 DIAGNOSIS — E1122 Type 2 diabetes mellitus with diabetic chronic kidney disease: Secondary | ICD-10-CM | POA: Diagnosis not present

## 2018-10-08 DIAGNOSIS — A419 Sepsis, unspecified organism: Secondary | ICD-10-CM | POA: Diagnosis not present

## 2018-10-08 DIAGNOSIS — K810 Acute cholecystitis: Secondary | ICD-10-CM | POA: Diagnosis not present

## 2018-10-08 DIAGNOSIS — I2510 Atherosclerotic heart disease of native coronary artery without angina pectoris: Secondary | ICD-10-CM | POA: Diagnosis not present

## 2018-10-08 DIAGNOSIS — N186 End stage renal disease: Secondary | ICD-10-CM | POA: Diagnosis not present

## 2018-10-09 DIAGNOSIS — N186 End stage renal disease: Secondary | ICD-10-CM | POA: Diagnosis not present

## 2018-10-09 DIAGNOSIS — D509 Iron deficiency anemia, unspecified: Secondary | ICD-10-CM | POA: Diagnosis not present

## 2018-10-09 DIAGNOSIS — N2581 Secondary hyperparathyroidism of renal origin: Secondary | ICD-10-CM | POA: Diagnosis not present

## 2018-10-09 DIAGNOSIS — D631 Anemia in chronic kidney disease: Secondary | ICD-10-CM | POA: Diagnosis not present

## 2018-10-12 DIAGNOSIS — N2581 Secondary hyperparathyroidism of renal origin: Secondary | ICD-10-CM | POA: Diagnosis not present

## 2018-10-12 DIAGNOSIS — D631 Anemia in chronic kidney disease: Secondary | ICD-10-CM | POA: Diagnosis not present

## 2018-10-12 DIAGNOSIS — N186 End stage renal disease: Secondary | ICD-10-CM | POA: Diagnosis not present

## 2018-10-12 DIAGNOSIS — D509 Iron deficiency anemia, unspecified: Secondary | ICD-10-CM | POA: Diagnosis not present

## 2018-10-13 DIAGNOSIS — J324 Chronic pansinusitis: Secondary | ICD-10-CM | POA: Diagnosis not present

## 2018-10-14 DIAGNOSIS — N2581 Secondary hyperparathyroidism of renal origin: Secondary | ICD-10-CM | POA: Diagnosis not present

## 2018-10-14 DIAGNOSIS — D631 Anemia in chronic kidney disease: Secondary | ICD-10-CM | POA: Diagnosis not present

## 2018-10-14 DIAGNOSIS — N186 End stage renal disease: Secondary | ICD-10-CM | POA: Diagnosis not present

## 2018-10-14 DIAGNOSIS — D509 Iron deficiency anemia, unspecified: Secondary | ICD-10-CM | POA: Diagnosis not present

## 2018-10-15 DIAGNOSIS — N186 End stage renal disease: Secondary | ICD-10-CM | POA: Diagnosis not present

## 2018-10-15 DIAGNOSIS — K810 Acute cholecystitis: Secondary | ICD-10-CM | POA: Diagnosis not present

## 2018-10-15 DIAGNOSIS — I120 Hypertensive chronic kidney disease with stage 5 chronic kidney disease or end stage renal disease: Secondary | ICD-10-CM | POA: Diagnosis not present

## 2018-10-15 DIAGNOSIS — I2510 Atherosclerotic heart disease of native coronary artery without angina pectoris: Secondary | ICD-10-CM | POA: Diagnosis not present

## 2018-10-15 DIAGNOSIS — A419 Sepsis, unspecified organism: Secondary | ICD-10-CM | POA: Diagnosis not present

## 2018-10-15 DIAGNOSIS — E1122 Type 2 diabetes mellitus with diabetic chronic kidney disease: Secondary | ICD-10-CM | POA: Diagnosis not present

## 2018-10-16 DIAGNOSIS — D631 Anemia in chronic kidney disease: Secondary | ICD-10-CM | POA: Diagnosis not present

## 2018-10-16 DIAGNOSIS — N186 End stage renal disease: Secondary | ICD-10-CM | POA: Diagnosis not present

## 2018-10-16 DIAGNOSIS — D509 Iron deficiency anemia, unspecified: Secondary | ICD-10-CM | POA: Diagnosis not present

## 2018-10-16 DIAGNOSIS — N2581 Secondary hyperparathyroidism of renal origin: Secondary | ICD-10-CM | POA: Diagnosis not present

## 2018-10-18 ENCOUNTER — Other Ambulatory Visit: Payer: Self-pay

## 2018-10-18 ENCOUNTER — Ambulatory Visit (HOSPITAL_COMMUNITY)
Admission: RE | Admit: 2018-10-18 | Discharge: 2018-10-18 | Disposition: A | Discharge status: Home / Self Care | Payer: Medicare Other | Source: Ambulatory Visit | Attending: Surgery | Admitting: Surgery

## 2018-10-18 ENCOUNTER — Encounter

## 2018-10-18 ENCOUNTER — Encounter: Payer: Self-pay | Admitting: Surgery

## 2018-10-18 ENCOUNTER — Ambulatory Visit (INDEPENDENT_AMBULATORY_CARE_PROVIDER_SITE_OTHER): Payer: Medicare Other | Admitting: Surgery

## 2018-10-18 VITALS — BP 104/59 | HR 88 | Temp 98.4°F | Ht 70.0 in | Wt 265.0 lb

## 2018-10-18 DIAGNOSIS — I739 Peripheral vascular disease, unspecified: Secondary | ICD-10-CM

## 2018-10-18 DIAGNOSIS — N186 End stage renal disease: Secondary | ICD-10-CM

## 2018-10-18 DIAGNOSIS — Z992 Dependence on renal dialysis: Secondary | ICD-10-CM | POA: Diagnosis not present

## 2018-10-18 NOTE — Progress Notes (Signed)
Vascular and Vein Specialist of [facility]  Patient name: Christian Dalton MRN: [id number] DOB: [birth year] Sex: male   REQUESTING PROVIDER:    Dr. Venetia Maxon   REASON FOR CONSULT:    Pain and discoloration of both feet  HISTORY OF PRESENT ILLNESS:   Christian Dalton is a 77 y.o. male, who is referred for further evaluation of pain and discoloration in both feet.  He does not report any pain or discomfort in his feet.  He does not have any open wounds.  Patient suffers from coronary artery disease.  He last underwent catheterization in 2016 which was nonobstructive.  He suffers from end-stage renal disease on dialysis.  The patient is a diabetic.  PAST MEDICAL HISTORY    Past Medical History:  Diagnosis Date  . CAD (coronary artery disease)   . COPD (chronic obstructive pulmonary disease) ([place])   . Diabetes mellitus without complication ([place])   . ESRD (end stage renal disease) on dialysis ([place])   . Hypertension   . OSA (obstructive sleep apnea)   . Renal disorder   . Spinal stenosis      FAMILY HISTORY   No family history on file.  SOCIAL HISTORY:   Social History   Socioeconomic History  . Marital status: Married    Spouse name: Di Kindle  . Number of children: Not on file  . Years of education: Not on file  . Highest education level: Not on file  Occupational History  . Not on file  Social Needs  . Financial resource strain: Not hard at all  . Food insecurity:    Worry: Patient refused    Inability: Patient refused  . Transportation needs:    Medical: Patient refused    Non-medical: Patient refused  Tobacco Use  . Smoking status: Never Smoker  . Smokeless tobacco: Former Network engineer and Sexual Activity  . Alcohol use: Yes  . Drug use: No  . Sexual activity: Not on file  Lifestyle  . Physical activity:    Days per week: Patient refused    Minutes per session: Patient refused  . Stress: Only a little  Relationships  .  Social connections:    Talks on phone: Not on file    Gets together: Not on file    Attends religious service: Not on file    Active member of club or organization: Not on file    Attends meetings of clubs or organizations: Not on file    Relationship status: Not on file  . Intimate partner violence:    Fear of current or ex partner: Not on file    Emotionally abused: Not on file    Physically abused: Not on file    Forced sexual activity: Not on file  Other Topics Concern  . Not on file  Social History Narrative  . Not on file    ALLERGIES:    Allergies  Allergen Reactions  . Avelox [Moxifloxacin Hcl] Other (See Comments)    unknown  . Sulfa Antibiotics Other (See Comments)    unknown  . Tetracyclines & Related Other (See Comments)    unknown    CURRENT MEDICATIONS:    Current Outpatient Medications  Medication Sig Dispense Refill  . Amino Acids-Protein Hydrolys (FEEDING SUPPLEMENT, PRO-STAT SUGAR FREE 64,) LIQD Take 30 mLs by mouth 2 (two) times daily. 900 mL 0  . amoxicillin-clavulanate (AUGMENTIN) 500-125 MG tablet Take 1 tablet (500 mg total) by mouth daily. 3 tablet 0  . aspirin EC 81  MG tablet Take 81 mg every evening by mouth.    Marland Kitchen atorvastatin (LIPITOR) 20 MG tablet Take 1 tablet (20 mg total) by mouth every evening.    Marland Kitchen buPROPion (WELLBUTRIN XL) 150 MG 24 hr tablet Take 150 mg daily by mouth.    . calcitRIOL (ROCALTROL) 0.5 MCG capsule Take 2 capsules (1 mcg total) by mouth Every Tuesday,Thursday,and Saturday with dialysis. 10 capsule 0  . docusate sodium (COLACE) 100 MG capsule Take 100 mg 3 (three) times daily by mouth.    . escitalopram (LEXAPRO) 10 MG tablet Take 10 mg every evening by mouth.    . insulin detemir (LEVEMIR) 100 UNIT/ML injection Inject 0.14 mLs (14 Units total) into the skin 2 (two) times daily. 10 mL 11  . insulin lispro (HUMALOG) 100 UNIT/ML injection Inject 0.05 mLs (5 Units total) into the skin 2 (two) times daily. 10 mL 11  .  lactulose, encephalopathy, (CHRONULAC) 10 GM/15ML SOLN Take 15-30 mLs by mouth daily before supper.  4  . levothyroxine (SYNTHROID, LEVOTHROID) 125 MCG tablet Take 125 mcg daily by mouth.    . lidocaine-prilocaine (EMLA) cream Apply 1 application topically See admin instructions. To access site (AVF) 1-2 hours before dialysis. Cover with occlusive dressing (Saran warp)  4  . linaclotide (LINZESS) 145 MCG CAPS capsule Take 145 mcg daily after supper by mouth.    . loratadine (CLARITIN) 10 MG tablet Take 10 mg every evening by mouth.    . montelukast (SINGULAIR) 10 MG tablet Take 10 mg every evening by mouth.    . oxyCODONE (OXY IR/ROXICODONE) 5 MG immediate release tablet Take 1 tablet (5 mg total) by mouth every 4 (four) hours as needed for severe pain. 30 tablet 0  . pregabalin (LYRICA) 100 MG capsule Take 100 mg 2 (two) times daily by mouth.    . sevelamer carbonate (RENVELA) 800 MG tablet Take 3 tablets (2,400 mg total) by mouth 3 (three) times daily with meals. 45 tablet 0   No current facility-administered medications for this visit.     REVIEW OF SYSTEMS:   [X]  denotes positive finding, [ ]  denotes negative finding Cardiac  Comments:  Chest pain or chest pressure:    Shortness of breath upon exertion: x   Short of breath when lying flat: x   Irregular heart rhythm: x       Vascular    Pain in calf, thigh, or hip brought on by ambulation: x   Pain in feet at night that wakes you up from your sleep:  x   Blood clot in your veins:    Leg swelling:  x       Pulmonary    Oxygen at home:    Productive cough:     Wheezing:         Neurologic    Sudden weakness in arms or legs:  x   Sudden numbness in arms or legs:  x   Sudden onset of difficulty speaking or slurred speech:    Temporary loss of vision in one eye:     Problems with dizziness:         Gastrointestinal    Blood in stool:      Vomited blood:         Genitourinary    Burning when urinating:     Blood in urine:         Psychiatric    Major depression:         Hematologic  Bleeding problems:    Problems with blood clotting too easily:        Skin    Rashes or ulcers:        Constitutional    Fever or chills:     PHYSICAL EXAM:   There were no vitals filed for this visit.  GENERAL: The patient is a well-nourished male, in no acute distress. The vital signs are documented above. CARDIAC: There is a regular rate and rhythm.  VASCULAR:?  Faintly palpable pedal pulses PULMONARY: Nonlabored respirations MUSCULOSKELETAL: There are no major deformities or cyanosis. NEUROLOGIC: No focal weakness or paresthesias are detected. SKIN: There are no ulcers or rashes noted. PSYCHIATRIC: The patient has a normal affect.  STUDIES:   I have ordered and reviewed the vascular lab studies with the following findings:  +---------+------------------+-----+---------+--------+ Right  Rt Pressure (mmHg)IndexWaveform Comment  +---------+------------------+-----+---------+--------+ Brachial 156                     +---------+------------------+-----+---------+--------+ ATA   255        1.63 triphasic     +---------+------------------+-----+---------+--------+ PTA   255        1.63 triphasic     +---------+------------------+-----+---------+--------+ Ellen Henri        0.72           +---------+------------------+-----+---------+--------+  +---------+------------------+-----+---------+-------+ Left   Lt Pressure (mmHg)IndexWaveform Comment +---------+------------------+-----+---------+-------+ Brachial                  AVF   +---------+------------------+-----+---------+-------+ ATA   255        1.63 triphasic     +---------+------------------+-----+---------+-------+ PTA   255        1.63 triphasic      +---------+------------------+-----+---------+-------+ Great Toe126        0.81           +---------+------------------+-----+---------+-------+  +-------+-----------+-----------+------------+------------+ ABI/TBIToday's ABIToday's TBIPrevious ABIPrevious TBI +-------+-----------+-----------+------------+------------+ Right [address]     0.72                 +-------+-----------+-----------+------------+------------+ Left  [place]     0.81                 +-------+-----------+-----------+------------+------------+  ASSESSMENT and PLAN   ABIs today suggest calcified vessels however waveforms are triphasic and toe pressures are normal.  Therefore I do not think the patient has any significant stenosis identified today.  No vascular intervention is recommended at this time.  He is going to continue to monitor his feet on a daily basis and will contact me should he develop any concerns or questions.  I will see him back on an as-needed basis.   [name], MD Vascular and Vein Specialists of [facility] [phone] Pager [phone]

## 2018-10-19 DIAGNOSIS — D631 Anemia in chronic kidney disease: Secondary | ICD-10-CM | POA: Diagnosis not present

## 2018-10-19 DIAGNOSIS — D509 Iron deficiency anemia, unspecified: Secondary | ICD-10-CM | POA: Diagnosis not present

## 2018-10-19 DIAGNOSIS — N186 End stage renal disease: Secondary | ICD-10-CM | POA: Diagnosis not present

## 2018-10-19 DIAGNOSIS — N2581 Secondary hyperparathyroidism of renal origin: Secondary | ICD-10-CM | POA: Diagnosis not present

## 2018-10-21 DIAGNOSIS — D631 Anemia in chronic kidney disease: Secondary | ICD-10-CM | POA: Diagnosis not present

## 2018-10-21 DIAGNOSIS — N2581 Secondary hyperparathyroidism of renal origin: Secondary | ICD-10-CM | POA: Diagnosis not present

## 2018-10-21 DIAGNOSIS — D509 Iron deficiency anemia, unspecified: Secondary | ICD-10-CM | POA: Diagnosis not present

## 2018-10-21 DIAGNOSIS — N186 End stage renal disease: Secondary | ICD-10-CM | POA: Diagnosis not present

## 2018-10-23 DIAGNOSIS — D509 Iron deficiency anemia, unspecified: Secondary | ICD-10-CM | POA: Diagnosis not present

## 2018-10-23 DIAGNOSIS — D631 Anemia in chronic kidney disease: Secondary | ICD-10-CM | POA: Diagnosis not present

## 2018-10-23 DIAGNOSIS — N2581 Secondary hyperparathyroidism of renal origin: Secondary | ICD-10-CM | POA: Diagnosis not present

## 2018-10-23 DIAGNOSIS — N186 End stage renal disease: Secondary | ICD-10-CM | POA: Diagnosis not present

## 2018-10-24 DIAGNOSIS — Z992 Dependence on renal dialysis: Secondary | ICD-10-CM | POA: Diagnosis not present

## 2018-10-24 DIAGNOSIS — E1122 Type 2 diabetes mellitus with diabetic chronic kidney disease: Secondary | ICD-10-CM | POA: Diagnosis not present

## 2018-10-24 DIAGNOSIS — N186 End stage renal disease: Secondary | ICD-10-CM | POA: Diagnosis not present

## 2018-10-25 ENCOUNTER — Other Ambulatory Visit: Payer: Self-pay | Admitting: Gastroenterology

## 2018-10-25 DIAGNOSIS — Z86010 Personal history of colonic polyps: Secondary | ICD-10-CM | POA: Diagnosis not present

## 2018-10-25 DIAGNOSIS — K8020 Calculus of gallbladder without cholecystitis without obstruction: Secondary | ICD-10-CM | POA: Diagnosis not present

## 2018-10-25 DIAGNOSIS — R932 Abnormal findings on diagnostic imaging of liver and biliary tract: Secondary | ICD-10-CM | POA: Diagnosis not present

## 2018-10-25 DIAGNOSIS — K862 Cyst of pancreas: Secondary | ICD-10-CM | POA: Diagnosis not present

## 2018-10-26 DIAGNOSIS — N2581 Secondary hyperparathyroidism of renal origin: Secondary | ICD-10-CM | POA: Diagnosis not present

## 2018-10-26 DIAGNOSIS — E1121 Type 2 diabetes mellitus with diabetic nephropathy: Secondary | ICD-10-CM | POA: Diagnosis not present

## 2018-10-26 DIAGNOSIS — N186 End stage renal disease: Secondary | ICD-10-CM | POA: Diagnosis not present

## 2018-10-26 DIAGNOSIS — D631 Anemia in chronic kidney disease: Secondary | ICD-10-CM | POA: Diagnosis not present

## 2018-10-28 DIAGNOSIS — E1121 Type 2 diabetes mellitus with diabetic nephropathy: Secondary | ICD-10-CM | POA: Diagnosis not present

## 2018-10-28 DIAGNOSIS — N186 End stage renal disease: Secondary | ICD-10-CM | POA: Diagnosis not present

## 2018-10-28 DIAGNOSIS — N2581 Secondary hyperparathyroidism of renal origin: Secondary | ICD-10-CM | POA: Diagnosis not present

## 2018-10-28 DIAGNOSIS — D631 Anemia in chronic kidney disease: Secondary | ICD-10-CM | POA: Diagnosis not present

## 2018-10-30 DIAGNOSIS — D631 Anemia in chronic kidney disease: Secondary | ICD-10-CM | POA: Diagnosis not present

## 2018-10-30 DIAGNOSIS — N186 End stage renal disease: Secondary | ICD-10-CM | POA: Diagnosis not present

## 2018-10-30 DIAGNOSIS — N2581 Secondary hyperparathyroidism of renal origin: Secondary | ICD-10-CM | POA: Diagnosis not present

## 2018-10-30 DIAGNOSIS — E1121 Type 2 diabetes mellitus with diabetic nephropathy: Secondary | ICD-10-CM | POA: Diagnosis not present

## 2018-11-02 DIAGNOSIS — E1121 Type 2 diabetes mellitus with diabetic nephropathy: Secondary | ICD-10-CM | POA: Diagnosis not present

## 2018-11-02 DIAGNOSIS — N186 End stage renal disease: Secondary | ICD-10-CM | POA: Diagnosis not present

## 2018-11-02 DIAGNOSIS — N2581 Secondary hyperparathyroidism of renal origin: Secondary | ICD-10-CM | POA: Diagnosis not present

## 2018-11-02 DIAGNOSIS — D631 Anemia in chronic kidney disease: Secondary | ICD-10-CM | POA: Diagnosis not present

## 2018-11-03 DIAGNOSIS — M5126 Other intervertebral disc displacement, lumbar region: Secondary | ICD-10-CM | POA: Diagnosis not present

## 2018-11-03 DIAGNOSIS — Z6837 Body mass index (BMI) 37.0-37.9, adult: Secondary | ICD-10-CM | POA: Diagnosis not present

## 2018-11-03 DIAGNOSIS — M47816 Spondylosis without myelopathy or radiculopathy, lumbar region: Secondary | ICD-10-CM | POA: Diagnosis not present

## 2018-11-04 DIAGNOSIS — D631 Anemia in chronic kidney disease: Secondary | ICD-10-CM | POA: Diagnosis not present

## 2018-11-04 DIAGNOSIS — E1121 Type 2 diabetes mellitus with diabetic nephropathy: Secondary | ICD-10-CM | POA: Diagnosis not present

## 2018-11-04 DIAGNOSIS — N2581 Secondary hyperparathyroidism of renal origin: Secondary | ICD-10-CM | POA: Diagnosis not present

## 2018-11-04 DIAGNOSIS — N186 End stage renal disease: Secondary | ICD-10-CM | POA: Diagnosis not present

## 2018-11-06 DIAGNOSIS — N2581 Secondary hyperparathyroidism of renal origin: Secondary | ICD-10-CM | POA: Diagnosis not present

## 2018-11-06 DIAGNOSIS — N186 End stage renal disease: Secondary | ICD-10-CM | POA: Diagnosis not present

## 2018-11-06 DIAGNOSIS — E1121 Type 2 diabetes mellitus with diabetic nephropathy: Secondary | ICD-10-CM | POA: Diagnosis not present

## 2018-11-06 DIAGNOSIS — D631 Anemia in chronic kidney disease: Secondary | ICD-10-CM | POA: Diagnosis not present

## 2018-11-07 ENCOUNTER — Ambulatory Visit
Admission: RE | Admit: 2018-11-07 | Discharge: 2018-11-07 | Disposition: A | Discharge status: Home / Self Care | Payer: Medicare Other | Source: Ambulatory Visit | Attending: Gastroenterology | Admitting: Gastroenterology

## 2018-11-07 DIAGNOSIS — R932 Abnormal findings on diagnostic imaging of liver and biliary tract: Secondary | ICD-10-CM | POA: Diagnosis not present

## 2018-11-07 DIAGNOSIS — K8020 Calculus of gallbladder without cholecystitis without obstruction: Secondary | ICD-10-CM | POA: Diagnosis not present

## 2018-11-09 DIAGNOSIS — N186 End stage renal disease: Secondary | ICD-10-CM | POA: Diagnosis not present

## 2018-11-09 DIAGNOSIS — D631 Anemia in chronic kidney disease: Secondary | ICD-10-CM | POA: Diagnosis not present

## 2018-11-09 DIAGNOSIS — N2581 Secondary hyperparathyroidism of renal origin: Secondary | ICD-10-CM | POA: Diagnosis not present

## 2018-11-09 DIAGNOSIS — E1121 Type 2 diabetes mellitus with diabetic nephropathy: Secondary | ICD-10-CM | POA: Diagnosis not present

## 2018-11-10 DIAGNOSIS — K819 Cholecystitis, unspecified: Secondary | ICD-10-CM | POA: Diagnosis not present

## 2018-11-11 ENCOUNTER — Inpatient Hospital Stay (HOSPITAL_COMMUNITY)
Admission: AD | Admit: 2018-11-11 | Discharge: 2018-11-15 | DRG: 417 | Disposition: A | Discharge status: Home / Self Care | Payer: Medicare Other | Attending: Internal Medicine | Admitting: Internal Medicine

## 2018-11-11 DIAGNOSIS — J449 Chronic obstructive pulmonary disease, unspecified: Secondary | ICD-10-CM | POA: Diagnosis not present

## 2018-11-11 DIAGNOSIS — K8050 Calculus of bile duct without cholangitis or cholecystitis without obstruction: Secondary | ICD-10-CM | POA: Diagnosis not present

## 2018-11-11 DIAGNOSIS — Z8249 Family history of ischemic heart disease and other diseases of the circulatory system: Secondary | ICD-10-CM | POA: Diagnosis not present

## 2018-11-11 DIAGNOSIS — R509 Fever, unspecified: Secondary | ICD-10-CM | POA: Diagnosis not present

## 2018-11-11 DIAGNOSIS — K810 Acute cholecystitis: Secondary | ICD-10-CM | POA: Diagnosis present

## 2018-11-11 DIAGNOSIS — E785 Hyperlipidemia, unspecified: Secondary | ICD-10-CM | POA: Diagnosis present

## 2018-11-11 DIAGNOSIS — E66813 Obesity, class 3: Secondary | ICD-10-CM | POA: Diagnosis present

## 2018-11-11 DIAGNOSIS — K8066 Calculus of gallbladder and bile duct with acute and chronic cholecystitis without obstruction: Secondary | ICD-10-CM | POA: Diagnosis not present

## 2018-11-11 DIAGNOSIS — D649 Anemia, unspecified: Secondary | ICD-10-CM | POA: Diagnosis present

## 2018-11-11 DIAGNOSIS — E1129 Type 2 diabetes mellitus with other diabetic kidney complication: Secondary | ICD-10-CM

## 2018-11-11 DIAGNOSIS — N2581 Secondary hyperparathyroidism of renal origin: Secondary | ICD-10-CM | POA: Diagnosis not present

## 2018-11-11 DIAGNOSIS — Z743 Need for continuous supervision: Secondary | ICD-10-CM | POA: Diagnosis not present

## 2018-11-11 DIAGNOSIS — I10 Essential (primary) hypertension: Secondary | ICD-10-CM | POA: Diagnosis not present

## 2018-11-11 DIAGNOSIS — I4891 Unspecified atrial fibrillation: Secondary | ICD-10-CM | POA: Diagnosis present

## 2018-11-11 DIAGNOSIS — E039 Hypothyroidism, unspecified: Secondary | ICD-10-CM | POA: Diagnosis present

## 2018-11-11 DIAGNOSIS — I447 Left bundle-branch block, unspecified: Secondary | ICD-10-CM | POA: Diagnosis not present

## 2018-11-11 DIAGNOSIS — Z01810 Encounter for preprocedural cardiovascular examination: Secondary | ICD-10-CM | POA: Diagnosis not present

## 2018-11-11 DIAGNOSIS — Z87891 Personal history of nicotine dependence: Secondary | ICD-10-CM | POA: Diagnosis not present

## 2018-11-11 DIAGNOSIS — Z794 Long term (current) use of insulin: Secondary | ICD-10-CM | POA: Diagnosis not present

## 2018-11-11 DIAGNOSIS — I2510 Atherosclerotic heart disease of native coronary artery without angina pectoris: Secondary | ICD-10-CM | POA: Diagnosis present

## 2018-11-11 DIAGNOSIS — K8010 Calculus of gallbladder with chronic cholecystitis without obstruction: Secondary | ICD-10-CM | POA: Diagnosis not present

## 2018-11-11 DIAGNOSIS — K8001 Calculus of gallbladder with acute cholecystitis with obstruction: Secondary | ICD-10-CM | POA: Diagnosis not present

## 2018-11-11 DIAGNOSIS — K812 Acute cholecystitis with chronic cholecystitis: Secondary | ICD-10-CM | POA: Diagnosis not present

## 2018-11-11 DIAGNOSIS — K660 Peritoneal adhesions (postprocedural) (postinfection): Secondary | ICD-10-CM | POA: Diagnosis not present

## 2018-11-11 DIAGNOSIS — Z992 Dependence on renal dialysis: Secondary | ICD-10-CM | POA: Diagnosis not present

## 2018-11-11 DIAGNOSIS — Z79899 Other long term (current) drug therapy: Secondary | ICD-10-CM | POA: Diagnosis not present

## 2018-11-11 DIAGNOSIS — R945 Abnormal results of liver function studies: Secondary | ICD-10-CM | POA: Diagnosis not present

## 2018-11-11 DIAGNOSIS — K819 Cholecystitis, unspecified: Secondary | ICD-10-CM | POA: Diagnosis not present

## 2018-11-11 DIAGNOSIS — R740 Nonspecific elevation of levels of transaminase and lactic acid dehydrogenase [LDH]: Secondary | ICD-10-CM | POA: Diagnosis not present

## 2018-11-11 DIAGNOSIS — I120 Hypertensive chronic kidney disease with stage 5 chronic kidney disease or end stage renal disease: Secondary | ICD-10-CM | POA: Diagnosis present

## 2018-11-11 DIAGNOSIS — E1122 Type 2 diabetes mellitus with diabetic chronic kidney disease: Secondary | ICD-10-CM | POA: Diagnosis not present

## 2018-11-11 DIAGNOSIS — R404 Transient alteration of awareness: Secondary | ICD-10-CM | POA: Diagnosis not present

## 2018-11-11 DIAGNOSIS — K8309 Other cholangitis: Secondary | ICD-10-CM | POA: Diagnosis not present

## 2018-11-11 DIAGNOSIS — E1121 Type 2 diabetes mellitus with diabetic nephropathy: Secondary | ICD-10-CM | POA: Diagnosis not present

## 2018-11-11 DIAGNOSIS — R1011 Right upper quadrant pain: Secondary | ICD-10-CM | POA: Diagnosis not present

## 2018-11-11 DIAGNOSIS — E162 Hypoglycemia, unspecified: Secondary | ICD-10-CM | POA: Diagnosis not present

## 2018-11-11 DIAGNOSIS — Z6841 Body Mass Index (BMI) 40.0 and over, adult: Secondary | ICD-10-CM | POA: Diagnosis not present

## 2018-11-11 DIAGNOSIS — K831 Obstruction of bile duct: Secondary | ICD-10-CM | POA: Diagnosis not present

## 2018-11-11 DIAGNOSIS — D631 Anemia in chronic kidney disease: Secondary | ICD-10-CM | POA: Diagnosis not present

## 2018-11-11 DIAGNOSIS — G4733 Obstructive sleep apnea (adult) (pediatric): Secondary | ICD-10-CM | POA: Diagnosis present

## 2018-11-11 DIAGNOSIS — Z7982 Long term (current) use of aspirin: Secondary | ICD-10-CM | POA: Diagnosis not present

## 2018-11-11 DIAGNOSIS — E119 Type 2 diabetes mellitus without complications: Secondary | ICD-10-CM

## 2018-11-11 DIAGNOSIS — E161 Other hypoglycemia: Secondary | ICD-10-CM | POA: Diagnosis not present

## 2018-11-11 DIAGNOSIS — R748 Abnormal levels of other serum enzymes: Secondary | ICD-10-CM | POA: Diagnosis not present

## 2018-11-11 DIAGNOSIS — K5730 Diverticulosis of large intestine without perforation or abscess without bleeding: Secondary | ICD-10-CM | POA: Diagnosis not present

## 2018-11-11 DIAGNOSIS — T68XXXA Hypothermia, initial encounter: Secondary | ICD-10-CM | POA: Diagnosis not present

## 2018-11-11 DIAGNOSIS — N186 End stage renal disease: Secondary | ICD-10-CM | POA: Diagnosis not present

## 2018-11-11 DIAGNOSIS — E1165 Type 2 diabetes mellitus with hyperglycemia: Secondary | ICD-10-CM | POA: Diagnosis not present

## 2018-11-11 DIAGNOSIS — R279 Unspecified lack of coordination: Secondary | ICD-10-CM | POA: Diagnosis not present

## 2018-11-11 HISTORY — DX: Acute cholecystitis: K81.0

## 2018-11-11 HISTORY — DX: Cholecystitis, unspecified: K81.9

## 2018-11-11 LAB — BASIC METABOLIC PANEL
Anion gap: 15 (ref 5–15)
BUN: 19 mg/dL (ref 8–23)
CO2: 26 mmol/L (ref 22–32)
Calcium: 8.7 mg/dL — ABNORMAL LOW (ref 8.9–10.3)
Chloride: 97 mmol/L — ABNORMAL LOW (ref 98–111)
Creatinine, Ser: 6.02 mg/dL — ABNORMAL HIGH (ref 0.61–1.24)
GFR calc Af Amer: 10 mL/min — ABNORMAL LOW (ref >60)
GFR calc non Af Amer: 8 mL/min — ABNORMAL LOW (ref >60)
Glucose, Bld: 156 mg/dL — ABNORMAL HIGH (ref 70–99)
Potassium: 4.5 mmol/L (ref 3.5–5.1)
Sodium: 138 mmol/L (ref 135–145)

## 2018-11-11 LAB — GLUCOSE, CAPILLARY: Glucose-Capillary: 137 mg/dL — ABNORMAL HIGH (ref 70–99)

## 2018-11-11 LAB — CBC
HCT: 38 % — ABNORMAL LOW (ref 39.0–52.0)
Hemoglobin: 11.7 g/dL — ABNORMAL LOW (ref 13.0–17.0)
MCH: 28.4 pg (ref 26.0–34.0)
MCHC: 30.8 g/dL (ref 30.0–36.0)
MCV: 92.2 fL (ref 80.0–100.0)
Platelets: 290 10*3/uL (ref 150–400)
RBC: 4.12 MIL/uL — ABNORMAL LOW (ref 4.22–5.81)
RDW: 17.9 % — ABNORMAL HIGH (ref 11.5–15.5)
WBC: 15.4 10*3/uL — ABNORMAL HIGH (ref 4.0–10.5)
nRBC: 0 % (ref 0.0–0.2)

## 2018-11-11 MED ORDER — INSULIN ASPART 100 UNIT/ML ~~LOC~~ SOLN
0.0000 [IU] | Freq: Three times a day (TID) | SUBCUTANEOUS | Status: DC
  Administered 2018-11-12: 1 [IU] via SUBCUTANEOUS
  Administered 2018-11-14: 2 [IU] via SUBCUTANEOUS

## 2018-11-11 MED ORDER — ONDANSETRON HCL 4 MG/2ML IJ SOLN
4.0000 mg | Freq: Four times a day (QID) | INTRAMUSCULAR | Status: DC | PRN
  Administered 2018-11-14: 4 mg via INTRAVENOUS

## 2018-11-11 MED ORDER — HEPARIN SODIUM (PORCINE) 5000 UNIT/ML IJ SOLN
5000.0000 [IU] | Freq: Three times a day (TID) | INTRAMUSCULAR | Status: DC
  Administered 2018-11-12 – 2018-11-13 (×5): 5000 [IU] via SUBCUTANEOUS
  Filled 2018-11-11 (×5): qty 1

## 2018-11-11 MED ORDER — MORPHINE SULFATE (PF) 2 MG/ML IV SOLN: 2.0000 mg | INTRAVENOUS | Status: DC | PRN

## 2018-11-11 MED ORDER — ONDANSETRON HCL 4 MG PO TABS: 4.0000 mg | ORAL_TABLET | Freq: Four times a day (QID) | ORAL | Status: DC | PRN

## 2018-11-11 MED ORDER — SODIUM CHLORIDE 0.9 % IV SOLN
INTRAVENOUS | Status: DC
  Administered 2018-11-12 (×2): via INTRAVENOUS

## 2018-11-11 MED ORDER — INSULIN ASPART 100 UNIT/ML ~~LOC~~ SOLN
0.0000 [IU] | Freq: Every day | SUBCUTANEOUS | Status: DC
  Administered 2018-11-14: 3 [IU] via SUBCUTANEOUS

## 2018-11-11 NOTE — H&P (Signed)
History and Physical   [name] EQA:[id number] DOB: Aug 07, 1941 DOA: 11/11/2018  Referring MD/NP/PA: Dr. Colin Dalton at [facility]  PCP: Street, Christian Mt, MD   Patient coming from: [facility]  Chief Complaint: Abdominal pain  HPI: Christian Dalton is a 77 y.o. male with medical history significant of end-stage renal disease, patient on hemodialysis Mondays Wednesdays Fridays, coronary artery disease, atrial fibrillation, diabetes, hypertension, obstructive sleep apnea, morbid obesity and COPD who had acute cholecystitis back in August 14, 2018.  Patient was high risk for surgery and cholecystostomy tube was inserted.  Patient has had recurrent complications but finally get the tubes removed recently.  Patient went to [facility] today was fever chills nausea vomiting.  He was having worsening pain and symptoms.  Patient has continued follow-up with Dr. Grandville Silos the general surgeon here.  Plan was to get cardiology clearance so he could get cholecystectomy.  Patient is having increasing right upper quadrant pain rated as 10 out of 10.  He has become more symptomatic with more elevated LFTs.  Acute cholangitis suspected and he was transferred here for treatment..  ED Course: At [facility] ER patient's temperature was 103.  His vitals were stable.  White count was 15.3.  His AST 293, ALT 245, AP 598 and total bilirubin 4.7, CXr normal..  Patient's percutaneous drain appears to still be in place but not draining since November 26 on CT in the ER.  Patient had MRCP done on the 15th of this month which showed cholelithiasis but no choledocholithiasis.  He has received a dose of Zosyn in the ER at [place].  Dr. Grandville Silos was informed.  Patient has been transferred here for treatment.  Patient has had hemodialysis today.  Review of Systems: As per HPI otherwise 10 point review of systems negative.    Past Medical History:  Diagnosis Date  . Atrial fibrillation ([place])   . CAD  (coronary artery disease)   . COPD (chronic obstructive pulmonary disease) ([place])   . Diabetes mellitus without complication ([place])   . ESRD (end stage renal disease) on dialysis ([place])   . Hypertension   . OSA (obstructive sleep apnea)   . Renal disorder   . Spinal stenosis     Past Surgical History:  Procedure Laterality Date  . HAND SURGERY Right   . IR DIALY SHUNT INTRO NEEDLE/INTRACATH INITIAL W/IMG LEFT Left 08/20/2018  . IR PERC CHOLECYSTOSTOMY  08/19/2018  . IR RADIOLOGIST EVAL & MGMT  09/29/2018  . SHOULDER SURGERY Right   . TIBIA FRACTURE SURGERY       reports that he has never smoked. He has quit using smokeless tobacco. He reports current alcohol use. He reports that he does not use drugs.  Allergies  Allergen Reactions  . Avelox [Moxifloxacin Hcl] Other (See Comments)    unknown  . Sulfa Antibiotics Other (See Comments)    unknown  . Tetracyclines & Related Other (See Comments)    unknown    Family History  Problem Relation Age of Onset  . Heart disease Mother   . Heart disease Father   . Heart disease Sister      Prior to Admission medications   Medication Sig Start Date End Date Taking? Authorizing Provider  Amino Acids-Protein Hydrolys (FEEDING SUPPLEMENT, PRO-STAT SUGAR FREE 64,) LIQD Take 30 mLs by mouth 2 (two) times daily. 08/24/18   Elgergawy, Silver Huguenin, MD  amoxicillin (AMOXIL) 875 MG tablet Take 875 mg by mouth daily.    [provider]  amoxicillin-clavulanate (  AUGMENTIN) 500-125 MG tablet Take 1 tablet (500 mg total) by mouth daily. Patient not taking: Reported on 10/18/2018 08/24/18   Elgergawy, Silver Huguenin, MD  aspirin EC 81 MG tablet Take 81 mg every evening by mouth.    [provider]  atorvastatin (LIPITOR) 20 MG tablet Take 1 tablet (20 mg total) by mouth every evening. 08/24/18   Elgergawy, Silver Huguenin, MD  buPROPion (WELLBUTRIN XL) 150 MG 24 hr tablet Take 150 mg daily by mouth.    [provider]  calcitRIOL (ROCALTROL)  0.5 MCG capsule Take 2 capsules (1 mcg total) by mouth Every Tuesday,Thursday,and Saturday with dialysis. 08/26/18   Elgergawy, Silver Huguenin, MD  docusate sodium (COLACE) 100 MG capsule Take 100 mg 3 (three) times daily by mouth.    [provider]  escitalopram (LEXAPRO) 10 MG tablet Take 10 mg every evening by mouth. 08/30/15   [provider]  insulin detemir (LEVEMIR) 100 UNIT/ML injection Inject 0.14 mLs (14 Units total) into the skin 2 (two) times daily. 08/24/18   Elgergawy, Silver Huguenin, MD  insulin lispro (HUMALOG) 100 UNIT/ML injection Inject 0.05 mLs (5 Units total) into the skin 2 (two) times daily. 08/24/18   Elgergawy, Silver Huguenin, MD  lactulose, encephalopathy, (CHRONULAC) 10 GM/15ML SOLN Take 15-30 mLs by mouth daily before supper. 08/02/18   [provider]  levothyroxine (SYNTHROID, LEVOTHROID) 125 MCG tablet Take 125 mcg daily by mouth.    [provider]  lidocaine-prilocaine (EMLA) cream Apply 1 application topically See admin instructions. To access site (AVF) 1-2 hours before dialysis. Cover with occlusive dressing (Saran warp) 08/02/18   [provider]  linaclotide (LINZESS) 145 MCG CAPS capsule Take 145 mcg daily after supper by mouth.    [provider]  loratadine (CLARITIN) 10 MG tablet Take 10 mg every evening by mouth.    [provider]  montelukast (SINGULAIR) 10 MG tablet Take 10 mg every evening by mouth.    [provider]  oxyCODONE (OXY IR/ROXICODONE) 5 MG immediate release tablet Take 1 tablet (5 mg total) by mouth every 4 (four) hours as needed for severe pain. 08/24/18   Elgergawy, Silver Huguenin, MD  pregabalin (LYRICA) 100 MG capsule Take 100 mg 2 (two) times daily by mouth. 07/23/15   [provider]  sevelamer carbonate (RENVELA) 800 MG tablet Take 3 tablets (2,400 mg total) by mouth 3 (three) times daily with meals. 08/24/18   Elgergawy, Silver Huguenin, MD    Physical Exam: Vitals:   11/11/18 2100 11/11/18  2235  BP:  (!) 112/91  Pulse:  95  Resp:  18  Temp:  98.6 F (37 C)  TempSrc:  Oral  SpO2:  (!) 89%  Weight: 118.2 kg   Height: 5\' 10"  (1.778 m)       Constitutional: NAD, calm, comfortable Vitals:   11/11/18 2100 11/11/18 2235  BP:  (!) 112/91  Pulse:  95  Resp:  18  Temp:  98.6 F (37 C)  TempSrc:  Oral  SpO2:  (!) 89%  Weight: 118.2 kg   Height: 5\' 10"  (1.778 m)    Eyes: PERRL, lids and conjunctivae normal ENMT: Mucous membranes are moist. Posterior pharynx clear of any exudate or lesions.Normal dentition.  Neck: normal, supple, no masses, no thyromegaly Respiratory: clear to auscultation bilaterally, no wheezing, no crackles. Normal respiratory effort. No accessory muscle use.  Cardiovascular: Regular rate and rhythm, no murmurs / rubs / gallops. No extremity edema. 2+ pedal pulses. No carotid  bruits.  Abdomen: ruq tenderness, no masses palpated. No hepatosplenomegaly. Bowel sounds positive.  Musculoskeletal: no clubbing / cyanosis. No joint deformity upper and lower extremities. Good ROM, no contractures. Normal muscle tone.  Skin: no rashes, lesions, ulcers. No induration Neurologic: CN 2-12 grossly intact. Sensation intact, DTR normal. Strength 5/5 in all 4.  Psychiatric: Normal judgment and insight. Alert and oriented x 3. Normal mood.     Labs on Admission: I have personally reviewed following labs and imaging studies  CBC: No results for input(s): WBC, NEUTROABS, HGB, HCT, MCV, PLT in the last 168 hours. Basic Metabolic Panel: No results for input(s): NA, K, CL, CO2, GLUCOSE, BUN, CREATININE, CALCIUM, MG, PHOS in the last 168 hours. GFR: CrCl cannot be calculated (Patient's most recent lab result is older than the maximum 21 days allowed.). Liver Function Tests: No results for input(s): AST, ALT, ALKPHOS, BILITOT, PROT, ALBUMIN in the last 168 hours. No results for input(s): LIPASE, AMYLASE in the last 168 hours. No results for input(s): AMMONIA in the  last 168 hours. Coagulation Profile: No results for input(s): INR, PROTIME in the last 168 hours. Cardiac Enzymes: No results for input(s): CKTOTAL, CKMB, CKMBINDEX, TROPONINI in the last 168 hours. BNP (last 3 results) No results for input(s): PROBNP in the last 8760 hours. HbA1C: No results for input(s): HGBA1C in the last 72 hours. CBG: Recent Labs  Lab 11/11/18 2234  GLUCAP 137*   Lipid Profile: No results for input(s): CHOL, HDL, LDLCALC, TRIG, CHOLHDL, LDLDIRECT in the last 72 hours. Thyroid Function Tests: No results for input(s): TSH, T4TOTAL, FREET4, T3FREE, THYROIDAB in the last 72 hours. Anemia Panel: No results for input(s): VITAMINB12, FOLATE, FERRITIN, TIBC, IRON, RETICCTPCT in the last 72 hours. Urine analysis: No results found for: COLORURINE, APPEARANCEUR, LABSPEC, PHURINE, GLUCOSEU, HGBUR, BILIRUBINUR, KETONESUR, PROTEINUR, UROBILINOGEN, NITRITE, LEUKOCYTESUR Sepsis Labs: @LABRCNTIP (procalcitonin:4,lacticidven:4) )No results found for this or any previous visit (from the past 240 hour(s)).   Radiological Exams on Admission: No results found.   Assessment/Plan Principal Problem:   Acute cholangitis Active Problems:   Acute cholecystitis   ESRD (end stage renal disease) on dialysis (HCC)   DM2 (diabetes mellitus, type 2) (HCC)   HTN (hypertension)   CAD (coronary artery disease)   Obesity, Class III, BMI 40-49.9 (morbid obesity) ([place])     #1 suspected acute cholangitis: Patient will be admitted and started on IV meropenem.  Pain management and supportive care.  Surgery will follow patient.  Dr. Grandville Silos has seen patient and recommended GI consult with possible plan for Cholecystitis.  #2 coronary artery disease: Patient is followed at [facility] hospital for his cardiology with known history of nonobstructive coronary artery disease last catheterization was in March 2016 at that point it was nonobstructive.  He has left bundle branch block also.   If surgery is planned patient may need cardiology consultation here for clearance  #3 end-stage renal disease: Patient has completed hemodialysis now.  Nephrology will be informed for hemodialysis on Friday  #4 hypertension: Blood pressure appears controlled.  Continue with current regimen.  #5 morbid obesity: Counseling to be provided.  #6 obstructive sleep apnea: Continue CPAP at night.   DVT prophylaxis: Heparin Code Status: Full Family Communication: None available with patient.  Disposition Plan: To be determined Consults called: General surgery Dr. Grandville Silos, nephrology Dr. Hollie Salk Admission status: Impaired  Severity of Illness: The appropriate patient status for this patient is INPATIENT. Inpatient status is judged to be reasonable and necessary in order to  provide the required intensity of service to ensure the patient's safety. The patient's presenting symptoms, physical exam findings, and initial radiographic and laboratory data in the context of their chronic comorbidities is felt to place them at high risk for further clinical deterioration. Furthermore, it is not anticipated that the patient will be medically stable for discharge from the hospital within 2 midnights of admission. The following factors support the patient status of inpatient.   " The patient's presenting symptoms include abdominal pain nausea vomiting. " The worrisome physical exam findings include right upper quadrant tenderness. " The initial radiographic and laboratory data are worrisome because of MRI yesterday showing no cholecystitis. " The chronic co-morbidities include diabetes, end-stage renal disease, and hypertension.   * I certify that at the point of admission it is my clinical judgment that the patient will require inpatient hospital care spanning beyond 2 midnights from the point of admission due to high intensity of service, high risk for further deterioration and high frequency of surveillance  required.Barbette Merino MD [facility] Pager [phone]  If 7PM-7AM, please contact night-coverage [email or web address] Password [email or web address]  11/11/2018, 11:06 PM

## 2018-11-11 NOTE — Consult Note (Signed)
Patient is well-known to our acute care surgery service.  He was admitted with sepsis and cholangitis type picture in October.  He underwent percutaneous cholecystostomy at that time due to his significant medical history including end-stage renal disease and cardiac disease.  Since that time, he has improved.  I saw him in the office yesterday with plans for cardiac clearance and proceeding with cholecystectomy if able.  He had a high fever at dialysis today and was referred to [facility].  There he was found to have significantly elevated liver function tests include bilirubin of 4.7, transaminases in the 200s, and leukocytosis of 15,300.  He was transferred to the hospitalist service at [facility].  He denies any abdominal pain right now except for a little discomfort around the exit site of the tube.  He says his fever resolved.  Awake and alert, lungs clear to auscultation, heart S1, S2, abdomen soft, cholecystostomy tube draining a small amount of bilious fluid, no significant abdominal tenderness  Impression: History of cholecystitis with cholelithiasis and previous cholangitis status post percutaneous cholecystostomy tube.  He was undergoing outpatient evaluation for consideration of cholecystectomy.  Recommend cardiology evaluation.  Additionally gastroenterology has been following him and he underwent MRCP within the past week which did not show any common duct stones.  In light of his liver function tests, he may have choledocholithiasis at this time.  Recommend gastroenterology evaluation, IV antibiotics, and bowel rest.  Renal consult. We will follow along and plan cholecystectomy this admission if medically suitable.  Georganna Skeans, MD, MPH, FACS Trauma: [phone] General Surgery: [phone]

## 2018-11-12 ENCOUNTER — Other Ambulatory Visit: Payer: Self-pay

## 2018-11-12 ENCOUNTER — Encounter (HOSPITAL_COMMUNITY): Payer: Self-pay | Admitting: General Practice

## 2018-11-12 DIAGNOSIS — E1165 Type 2 diabetes mellitus with hyperglycemia: Secondary | ICD-10-CM

## 2018-11-12 DIAGNOSIS — K810 Acute cholecystitis: Secondary | ICD-10-CM

## 2018-11-12 DIAGNOSIS — I10 Essential (primary) hypertension: Secondary | ICD-10-CM

## 2018-11-12 DIAGNOSIS — Z992 Dependence on renal dialysis: Secondary | ICD-10-CM

## 2018-11-12 DIAGNOSIS — Z794 Long term (current) use of insulin: Secondary | ICD-10-CM

## 2018-11-12 DIAGNOSIS — I2510 Atherosclerotic heart disease of native coronary artery without angina pectoris: Secondary | ICD-10-CM

## 2018-11-12 DIAGNOSIS — N186 End stage renal disease: Secondary | ICD-10-CM

## 2018-11-12 DIAGNOSIS — Z01810 Encounter for preprocedural cardiovascular examination: Secondary | ICD-10-CM

## 2018-11-12 LAB — GLUCOSE, CAPILLARY
Glucose-Capillary: 97 mg/dL (ref 70–99)
Glucose-Capillary: 102 mg/dL — ABNORMAL HIGH (ref 70–99)
Glucose-Capillary: 132 mg/dL — ABNORMAL HIGH (ref 70–99)
Glucose-Capillary: 103 mg/dL — ABNORMAL HIGH (ref 70–99)

## 2018-11-12 LAB — SURGICAL PCR SCREEN
MRSA, PCR: NEGATIVE
Staphylococcus aureus: NEGATIVE

## 2018-11-12 LAB — CBC
HCT: 41.2 % (ref 39.0–52.0)
Hemoglobin: 12.2 g/dL — ABNORMAL LOW (ref 13.0–17.0)
MCH: 27.5 pg (ref 26.0–34.0)
MCHC: 29.6 g/dL — ABNORMAL LOW (ref 30.0–36.0)
MCV: 93 fL (ref 80.0–100.0)
Platelets: 263 10*3/uL (ref 150–400)
RBC: 4.43 MIL/uL (ref 4.22–5.81)
RDW: 17.9 % — ABNORMAL HIGH (ref 11.5–15.5)
WBC: 10.9 10*3/uL — ABNORMAL HIGH (ref 4.0–10.5)
nRBC: 0 % (ref 0.0–0.2)

## 2018-11-12 LAB — COMPREHENSIVE METABOLIC PANEL
ALT: 173 U/L — ABNORMAL HIGH (ref 0–44)
AST: 170 U/L — ABNORMAL HIGH (ref 15–41)
Albumin: 2.8 g/dL — ABNORMAL LOW (ref 3.5–5.0)
Alkaline Phosphatase: 426 U/L — ABNORMAL HIGH (ref 38–126)
Anion gap: 14 (ref 5–15)
BUN: 23 mg/dL (ref 8–23)
CO2: 28 mmol/L (ref 22–32)
Calcium: 9 mg/dL (ref 8.9–10.3)
Chloride: 96 mmol/L — ABNORMAL LOW (ref 98–111)
Creatinine, Ser: 6.39 mg/dL — ABNORMAL HIGH (ref 0.61–1.24)
GFR calc Af Amer: 9 mL/min — ABNORMAL LOW (ref >60)
GFR calc non Af Amer: 8 mL/min — ABNORMAL LOW (ref >60)
Glucose, Bld: 114 mg/dL — ABNORMAL HIGH (ref 70–99)
Potassium: 4.2 mmol/L (ref 3.5–5.1)
Sodium: 138 mmol/L (ref 135–145)
Total Bilirubin: 4 mg/dL — ABNORMAL HIGH (ref 0.3–1.2)
Total Protein: 6.2 g/dL — ABNORMAL LOW (ref 6.5–8.1)

## 2018-11-12 LAB — LIPASE, BLOOD: Lipase: 19 U/L (ref 11–51)

## 2018-11-12 MED ORDER — LEVOTHYROXINE SODIUM 25 MCG PO TABS
125.0000 ug | ORAL_TABLET | Freq: Every day | ORAL | Status: DC
  Administered 2018-11-14 – 2018-11-15 (×2): 125 ug via ORAL
  Filled 2018-11-12 (×2): qty 1

## 2018-11-12 MED ORDER — OXYCODONE-ACETAMINOPHEN 5-325 MG PO TABS
1.0000 | ORAL_TABLET | Freq: Four times a day (QID) | ORAL | Status: DC | PRN
  Administered 2018-11-12 – 2018-11-13 (×2): 2 via ORAL
  Administered 2018-11-14: 1 via ORAL
  Administered 2018-11-14 – 2018-11-15 (×3): 2 via ORAL
  Filled 2018-11-12 (×5): qty 2
  Filled 2018-11-12: qty 1
  Filled 2018-11-12: qty 2

## 2018-11-12 MED ORDER — SODIUM CHLORIDE 0.9 % IV SOLN
500.0000 mg | INTRAVENOUS | Status: DC
  Administered 2018-11-12 – 2018-11-14 (×5): 500 mg via INTRAVENOUS
  Filled 2018-11-12 (×6): qty 0.5

## 2018-11-12 MED ORDER — CHLORHEXIDINE GLUCONATE CLOTH 2 % EX PADS
6.0000 | MEDICATED_PAD | Freq: Every day | CUTANEOUS | Status: DC
  Administered 2018-11-12 – 2018-11-14 (×3): 6 via TOPICAL

## 2018-11-12 MED ORDER — GABAPENTIN 300 MG PO CAPS
300.0000 mg | ORAL_CAPSULE | Freq: Every day | ORAL | Status: DC
  Administered 2018-11-12 – 2018-11-14 (×4): 300 mg via ORAL
  Filled 2018-11-12 (×4): qty 1

## 2018-11-12 MED ORDER — INSULIN DETEMIR 100 UNIT/ML ~~LOC~~ SOLN
5.0000 [IU] | Freq: Two times a day (BID) | SUBCUTANEOUS | Status: DC
  Administered 2018-11-12 – 2018-11-15 (×6): 5 [IU] via SUBCUTANEOUS
  Filled 2018-11-12 (×7): qty 0.05

## 2018-11-12 MED ORDER — ATORVASTATIN CALCIUM 10 MG PO TABS
20.0000 mg | ORAL_TABLET | Freq: Every evening | ORAL | Status: DC
  Administered 2018-11-13 – 2018-11-14 (×2): 20 mg via ORAL
  Filled 2018-11-12 (×2): qty 2

## 2018-11-12 MED ORDER — MUPIROCIN 2 % EX OINT
1.0000 "application " | TOPICAL_OINTMENT | Freq: Two times a day (BID) | CUTANEOUS | Status: DC
  Administered 2018-11-12 (×2): 1 via NASAL
  Filled 2018-11-12 (×2): qty 22

## 2018-11-12 NOTE — Consult Note (Signed)
Referring Provider: Dr. Cruzita Lederer Primary Care Physician:  Street, Sharon Mt, MD Primary Gastroenterologist:  Dr. Alessandra Bevels  Reason for Consultation:  Elevated liver enzymes  HPI: Christian Dalton is a 77 y.o. male with multiple medical problems who is s/p cholecystostomy tube in late September for cholecystitis seen on MRCP without CBD stones. He was medically managed and discharged home. Cholangiogram on 09/29/18 showed retained stones in GB lumen and no contrast seen in small bowel suggesting possible stones in the CBD. He saw Dr. Alessandra Bevels on 10/25/18 and LFTs revealed elevated ALP of 132 and otherwise LFTs were normal with TB 0.4, AST 23, ALT 30. MRCP on 11/08/18 negative for CBD stones and no ductal dilation seen. Gallstones noted without any acute cholecystitis seen. Had a fever to 103 at dialysis yesterday and was brought to ER where LFTs were elevated with TB 4, ALP 426, AST 170, ALT 173, WBC 15.4. Lipase not done on admit. Patient reports having severe epigastric and RUQ pain following flushing of his cholecystostomy tube yesterday morning by his wife but denies any pain when it was flushed this morning. Denies abdominal pain right now. Surgery awaiting cardiac clearance.   Past Medical History:  Diagnosis Date  . Atrial fibrillation ([place])   . CAD (coronary artery disease)   . Cholecystitis   . COPD (chronic obstructive pulmonary disease) ([employer])   . Diabetes mellitus without complication ([place])   . ESRD (end stage renal disease) on dialysis ([place])   . Hypertension   . OSA (obstructive sleep apnea)   . Renal disorder   . Spinal stenosis     Past Surgical History:  Procedure Laterality Date  . HAND SURGERY Right   . IR DIALY SHUNT INTRO NEEDLE/INTRACATH INITIAL W/IMG LEFT Left 08/20/2018  . IR PERC CHOLECYSTOSTOMY  08/19/2018  . IR RADIOLOGIST EVAL & MGMT  09/29/2018  . SHOULDER SURGERY Right   . TIBIA FRACTURE SURGERY      Prior to Admission medications   Medication Sig Start Date  End Date Taking? Authorizing Provider  Amino Acids-Protein Hydrolys (FEEDING SUPPLEMENT, PRO-STAT SUGAR FREE 64,) LIQD Take 30 mLs by mouth 2 (two) times daily. 08/24/18   Elgergawy, Silver Huguenin, MD  amoxicillin (AMOXIL) 875 MG tablet Take 875 mg by mouth daily.    [provider]  amoxicillin-clavulanate (AUGMENTIN) 500-125 MG tablet Take 1 tablet (500 mg total) by mouth daily. Patient not taking: Reported on 10/18/2018 08/24/18   Elgergawy, Silver Huguenin, MD  aspirin EC 81 MG tablet Take 81 mg every evening by mouth.    [provider]  atorvastatin (LIPITOR) 20 MG tablet Take 1 tablet (20 mg total) by mouth every evening. 08/24/18   Elgergawy, Silver Huguenin, MD  buPROPion (WELLBUTRIN XL) 150 MG 24 hr tablet Take 150 mg daily by mouth.    [provider]  calcitRIOL (ROCALTROL) 0.5 MCG capsule Take 2 capsules (1 mcg total) by mouth Every Tuesday,Thursday,and Saturday with dialysis. 08/26/18   Elgergawy, Silver Huguenin, MD  docusate sodium (COLACE) 100 MG capsule Take 100 mg 3 (three) times daily by mouth.    [provider]  escitalopram (LEXAPRO) 10 MG tablet Take 10 mg every evening by mouth. 08/30/15   [provider]  insulin detemir (LEVEMIR) 100 UNIT/ML injection Inject 0.14 mLs (14 Units total) into the skin 2 (two) times daily. 08/24/18   Elgergawy, Silver Huguenin, MD  insulin lispro (HUMALOG) 100 UNIT/ML injection Inject 0.05 mLs (5 Units total) into the skin 2 (two) times daily. 08/24/18  Elgergawy, Silver Huguenin, MD  lactulose, encephalopathy, (CHRONULAC) 10 GM/15ML SOLN Take 15-30 mLs by mouth daily before supper. 08/02/18   [provider]  levothyroxine (SYNTHROID, LEVOTHROID) 125 MCG tablet Take 125 mcg daily by mouth.    [provider]  lidocaine-prilocaine (EMLA) cream Apply 1 application topically See admin instructions. To access site (AVF) 1-2 hours before dialysis. Cover with occlusive dressing (Saran warp) 08/02/18   [provider]   linaclotide (LINZESS) 145 MCG CAPS capsule Take 145 mcg daily after supper by mouth.    [provider]  loratadine (CLARITIN) 10 MG tablet Take 10 mg every evening by mouth.    [provider]  montelukast (SINGULAIR) 10 MG tablet Take 10 mg every evening by mouth.    [provider]  oxyCODONE (OXY IR/ROXICODONE) 5 MG immediate release tablet Take 1 tablet (5 mg total) by mouth every 4 (four) hours as needed for severe pain. 08/24/18   Elgergawy, Silver Huguenin, MD  pregabalin (LYRICA) 100 MG capsule Take 100 mg 2 (two) times daily by mouth. 07/23/15   [provider]  sevelamer carbonate (RENVELA) 800 MG tablet Take 3 tablets (2,400 mg total) by mouth 3 (three) times daily with meals. 08/24/18   Elgergawy, Silver Huguenin, MD    Scheduled Meds: . heparin  5,000 Units Subcutaneous Q8H  . insulin aspart  0-5 Units Subcutaneous QHS  . insulin aspart  0-9 Units Subcutaneous TID WC  . mupirocin ointment  1 application Nasal BID   Continuous Infusions: . sodium chloride 100 mL/hr at 11/12/18 0049  . meropenem (MERREM) IV 500 mg (11/12/18 0225)   PRN Meds:.morphine injection, ondansetron **OR** ondansetron (ZOFRAN) IV  Allergies as of 11/11/2018 - Review Complete 10/18/2018  Allergen Reaction Noted  . Avelox [moxifloxacin hcl] Other (See Comments) 10/05/2017  . Sulfa antibiotics Other (See Comments) 05/16/2016  . Tetracyclines & related Other (See Comments) 05/16/2016    Family History  Problem Relation Age of Onset  . Heart disease Mother   . Heart disease Father   . Heart disease Sister     Social History   Socioeconomic History  . Marital status: Married    Spouse name: Di Kindle  . Number of children: Not on file  . Years of education: Not on file  . Highest education level: Not on file  Occupational History  . Not on file  Social Needs  . Financial resource strain: Not hard at all  . Food insecurity:    Worry: Patient refused    Inability: Patient  refused  . Transportation needs:    Medical: Patient refused    Non-medical: Patient refused  Tobacco Use  . Smoking status: Never Smoker  . Smokeless tobacco: Former Network engineer and Sexual Activity  . Alcohol use: Not Currently  . Drug use: No  . Sexual activity: Not on file  Lifestyle  . Physical activity:    Days per week: Patient refused    Minutes per session: Patient refused  . Stress: Only a little  Relationships  . Social connections:    Talks on phone: Not on file    Gets together: Not on file    Attends religious service: Not on file    Active member of club or organization: Not on file    Attends meetings of clubs or organizations: Not on file    Relationship status: Not on file  . Intimate partner violence:    Fear of current or ex partner: Not on file  Emotionally abused: Not on file    Physically abused: Not on file    Forced sexual activity: Not on file  Other Topics Concern  . Not on file  Social History Narrative  . Not on file    Review of Systems: All negative except as stated above in HPI.  Physical Exam: Vital signs: Vitals:   11/12/18 0418 11/12/18 0942  BP: (!) 146/70 (!) 119/45  Pulse: 89 79  Resp: 20 20  Temp: 98.1 F (36.7 C) 98.6 F (37 C)  SpO2: 97% 95%   Last BM Date: 11/10/18 General:  Lethargic, mild acute distress, obese Head: normocephalic, atraumatic Eyes: anicteric sclera ENT: oropharynx clear Neck: supple, nontender Lungs:  Clear throughout to auscultation.   No wheezes, crackles, or rhonchi. No acute distress. Heart:  Regular rate and rhythm; no murmurs, clicks, rubs,  or gallops. Abdomen: RUQ tenderness with mild guarding, minimal epigastric tenderness without guarding, soft, nondistended, +BS,   percutaneous tube noted in RUQ with drain bag attached (clear bile seen)  Rectal:  Deferred Ext: no edema  GI:  Lab Results: Recent Labs    11/11/18 2310 11/12/18 0430  WBC 15.4* 10.9*  HGB 11.7* 12.2*  HCT 38.0*  41.2  PLT 290 263   BMET Recent Labs    11/11/18 2316 11/12/18 0430  NA 138 138  K 4.5 4.2  CL 97* 96*  CO2 26 28  GLUCOSE 156* 114*  BUN 19 23  CREATININE 6.02* 6.39*  CALCIUM 8.7* 9.0   LFT Recent Labs    11/12/18 0430  PROT 6.2*  ALBUMIN 2.8*  AST 170*  ALT 173*  ALKPHOS 426*  BILITOT 4.0*   PT/INR No results for input(s): LABPROT, INR in the last 72 hours.   Studies/Results: No results found.  Impression/Plan: Elevated liver enzymes in the setting of gallstones with a recent negative MRCP. LFTs are markedly increased from what they were on 10/25/18 and may have a retained CBD stone vs passed a stone. I do NOT think he has acute cholangitis and would recommend continuing IV Abx, check Lipase, IVFs, bowel rest except for ice chips, sips of clears. NPO p MN in case ERCP needed. Follow LFTs in AM and if improvement then do GB surgery (if cards clears) and IOC but if LFTs remain at level they are or higher than likely will need a preop ERCP. Dr. Cristina Gong will f/u tomorrow.    LOS: 1 day   Lear Ng  11/12/2018, 9:58 AM  Questions please call [phone]

## 2018-11-12 NOTE — Progress Notes (Addendum)
PROGRESS NOTE  Christian Dalton GYI:[id number] DOB: July 01, 1941 DOA: 11/11/2018 PCP: Street, Sharon Mt, MD   LOS: 1 day   Brief Narrative / Interim history: 77 year old male with history of ESRD on HD MWF, CAD, hypothyroidism, coronary artery disease, type 2 diabetes mellitus, hypertension, OSA, COPD who was admitted to the hospital with abdominal pain.  He was recently hospitalized and discharged in September 2019 when he was diagnosed with acute cholecystitis.  He was deemed high risk for surgery and he underwent cholecystostomy tube placement by IR with plans for outpatient follow-up with surgery.  He went to [facility] the day prior to admission with fever, chills nausea and vomiting.  He was having worsening abdominal pain.  His LFTs were elevated and he was transferred here  Subjective: -Feeling better this morning, denies any fever or chills, still complains of right upper quadrant abdominal pain but appreciates improvement.  No chest pain, no palpitations.  Assessment & Plan: Principal Problem:   Acute cholangitis Active Problems:   Acute cholecystitis   ESRD (end stage renal disease) on dialysis ([facility])   DM2 (diabetes mellitus, type 2) ([facility])   HTN (hypertension)   CAD (coronary artery disease)   Obesity, Class III, BMI 40-49.9 (morbid obesity) ([place])   Principal Problem ?Choledocholithiasis / elevated LFTs -Patient recently hospitalized for acute cholecystitis status post cholecystostomy tube.  General surgery consulted in the ED, recommending GI input as well as cardiology clearance in case he will need surgery -Patient was placed on broad-spectrum antibiotics with meropenem, continue, he is afebrile and white count is improving -He underwent an MRCP 4 days ago as an outpatient which did not show any choledocholithiasis.  GI consulted, discussed with Dr. Cristina Gong, appreciate input  Additional Problems Coronary artery disease -He is normally followed at [facility] hospital for his cardiology, he has known nonobstructive CAD with last catheterization in March 2016.  Cardiology was consulted here at surgery's request for preop evaluation  ESRD -Nephrology consulted, appreciate input  Hypertension -Blood pressure appears controlled  OSA -Continue CPAP  Hypothyroidism -Continue Synthroid  Hyperlipidemia -Continue statin   Scheduled Meds: . heparin  5,000 Units Subcutaneous Q8H  . insulin aspart  0-5 Units Subcutaneous QHS  . insulin aspart  0-9 Units Subcutaneous TID WC  . mupirocin ointment  1 application Nasal BID   Continuous Infusions: . sodium chloride 100 mL/hr at 11/12/18 0049  . meropenem (MERREM) IV 500 mg (11/12/18 0225)   PRN Meds:.morphine injection, ondansetron **OR** ondansetron (ZOFRAN) IV  DVT prophylaxis: heparin Code Status: Full code Family Communication: no family at bedside Disposition Plan: home when ready   Consultants:   GI  Surgery  Cardiology   Nephrology   Procedures:   None   Antimicrobials:  Meropenem 12/19 >>   Objective: Vitals:   11/11/18 2100 11/11/18 2235 11/12/18 0418 11/12/18 0942  BP:  (!) 112/91 (!) 146/70 (!) 119/45  Pulse:  95 89 79  Resp:  18 20 20   Temp:  98.6 F (37 C) 98.1 F (36.7 C) 98.6 F (37 C)  TempSrc:  Oral Oral Oral  SpO2:  (!) 89% 97% 95%  Weight: 118.2 kg     Height: 5\' 10"  (1.778 m)       Intake/Output Summary (Last 24 hours) at 11/12/2018 1049 Last data filed at 11/12/2018 0931 Gross per 24 hour  Intake 0 ml  Output -  Net 0 ml   Filed Weights   11/11/18 2100  Weight: 118.2 kg  Examination:  Constitutional: NAD Eyes: PERRL, lids and conjunctivae normal ENMT: Mucous membranes are moist.  Neck: normal, supple Respiratory: clear to auscultation bilaterally, no wheezing, no crackles. Normal respiratory effort. No accessory muscle use.  Cardiovascular: Regular rate and rhythm, no murmurs / rubs / gallops. No LE edema.  Abdomen: no  tenderness. Bowel sounds positive.  Musculoskeletal: no clubbing / cyanosis. Skin: no rashes Neurologic: CN 2-12 grossly intact. Strength 5/5 in all 4.  Psychiatric: Normal judgment and insight. Alert and oriented x 3. Normal mood.    Data Reviewed: I have independently reviewed following labs and imaging studies   CBC: Recent Labs  Lab 11/11/18 2310 11/12/18 0430  WBC 15.4* 10.9*  HGB 11.7* 12.2*  HCT 38.0* 41.2  MCV 92.2 93.0  PLT 290 409   Basic Metabolic Panel: Recent Labs  Lab 11/11/18 2316 11/12/18 0430  NA 138 138  K 4.5 4.2  CL 97* 96*  CO2 26 28  GLUCOSE 156* 114*  BUN 19 23  CREATININE 6.02* 6.39*  CALCIUM 8.7* 9.0   GFR: Estimated Creatinine Clearance: 12.9 mL/min (A) (by C-G formula based on SCr of 6.39 mg/dL (H)). Liver Function Tests: Recent Labs  Lab 11/12/18 0430  AST 170*  ALT 173*  ALKPHOS 426*  BILITOT 4.0*  PROT 6.2*  ALBUMIN 2.8*   No results for input(s): LIPASE, AMYLASE in the last 168 hours. No results for input(s): AMMONIA in the last 168 hours. Coagulation Profile: No results for input(s): INR, PROTIME in the last 168 hours. Cardiac Enzymes: No results for input(s): CKTOTAL, CKMB, CKMBINDEX, TROPONINI in the last 168 hours. BNP (last 3 results) No results for input(s): PROBNP in the last 8760 hours. HbA1C: No results for input(s): HGBA1C in the last 72 hours. CBG: Recent Labs  Lab 11/11/18 2234 11/12/18 0727  GLUCAP 137* 97   Lipid Profile: No results for input(s): CHOL, HDL, LDLCALC, TRIG, CHOLHDL, LDLDIRECT in the last 72 hours. Thyroid Function Tests: No results for input(s): TSH, T4TOTAL, FREET4, T3FREE, THYROIDAB in the last 72 hours. Anemia Panel: No results for input(s): VITAMINB12, FOLATE, FERRITIN, TIBC, IRON, RETICCTPCT in the last 72 hours. Urine analysis: No results found for: COLORURINE, APPEARANCEUR, LABSPEC, PHURINE, GLUCOSEU, HGBUR, BILIRUBINUR, KETONESUR, PROTEINUR, UROBILINOGEN, NITRITE,  LEUKOCYTESUR Sepsis Labs: Invalid input(s): PROCALCITONIN, LACTICIDVEN  Recent Results (from the past 240 hour(s))  Surgical PCR screen     Status: None   Collection Time: 11/12/18  8:41 AM  Result Value Ref Range Status   MRSA, PCR NEGATIVE NEGATIVE Final   Staphylococcus aureus NEGATIVE NEGATIVE Final    Comment: (NOTE) The Xpert SA Assay (FDA approved for NASAL specimens in patients 88 years of age and older), is one component of a comprehensive surveillance program. It is not intended to diagnose infection nor to guide or monitor treatment. Performed at [facility], [address], [place], [place] [address]       Radiology Studies: No results found.   Marzetta Board, MD, PhD [facility] Pager [phone]  If 7PM-7AM, please contact night-coverage [email or web address] Password [facility] 11/12/2018, 10:49 AM
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
## 2018-11-12 NOTE — Progress Notes (Signed)
Pharmacy Antibiotic Note  Christian Dalton is a 77 y.o. male admitted on 11/11/2018 with intra-abdominal infection.  Pharmacy has been consulted for Merrem dosing. WBC is elevated at 15.4. ESRD on HD.   Plan: Merrem 500 mg IV q24h Trend WBC, temp, HD schedule  F/U infectious work-up   Height: 5\' 10"  (177.8 cm) Weight: 260 lb 8 oz (118.2 kg) IBW/kg (Calculated) : 73  Temp (24hrs), Avg:98.6 F (37 C), Min:98.6 F (37 C), Max:98.6 F (37 C)  Recent Labs  Lab 11/11/18 2310 11/11/18 2316  WBC 15.4*  --   CREATININE  --  6.02*    Estimated Creatinine Clearance: 13.7 mL/min (A) (by C-G formula based on SCr of 6.02 mg/dL (H)).    Allergies  Allergen Reactions  . Avelox [Moxifloxacin Hcl] Other (See Comments)    unknown  . Sulfa Antibiotics Other (See Comments)    unknown  . Tetracyclines & Related Other (See Comments)    unknown     Narda Bonds 11/12/2018 12:37 AM

## 2018-11-12 NOTE — Progress Notes (Signed)
Patient ID: Christian Dalton, male   DOB: 19-Feb-1941, 77 y.o.   MRN: [id number]       Subjective: Pt with no complaints this am except he's hungry and would at least like something to drink.    Objective: Vital signs in last 24 hours: Temp:  [98.1 F (36.7 C)-98.6 F (37 C)] 98.1 F (36.7 C) (12/20 0418) Pulse Rate:  [89-95] 89 (12/20 0418) Resp:  [18-20] 20 (12/20 0418) BP: (112-146)/(70-91) 146/70 (12/20 0418) SpO2:  [89 %-97 %] 97 % (12/20 0418) Weight:  [503.5 kg] 118.2 kg (12/19 2100) Last BM Date: 11/10/18  Intake/Output from previous day: No intake/output data recorded. Intake/Output this shift: No intake/output data recorded.  PE: Heart: regular Lungs: CTAB Abd: severely obese, +BS, NT, perc chole drain in place with no output.  Lab Results:  Recent Labs    11/11/18 2310 11/12/18 0430  WBC 15.4* 10.9*  HGB 11.7* 12.2*  HCT 38.0* 41.2  PLT 290 263   BMET Recent Labs    11/11/18 2316 11/12/18 0430  NA 138 138  K 4.5 4.2  CL 97* 96*  CO2 26 28  GLUCOSE 156* 114*  BUN 19 23  CREATININE 6.02* 6.39*  CALCIUM 8.7* 9.0   PT/INR No results for input(s): LABPROT, INR in the last 72 hours. CMP     Component Value Date/Time   NA 138 11/12/2018 0430   K 4.2 11/12/2018 0430   CL 96 (L) 11/12/2018 0430   CO2 28 11/12/2018 0430   GLUCOSE 114 (H) 11/12/2018 0430   BUN 23 11/12/2018 0430   CREATININE 6.39 (H) 11/12/2018 0430   CALCIUM 9.0 11/12/2018 0430   PROT 6.2 (L) 11/12/2018 0430   ALBUMIN 2.8 (L) 11/12/2018 0430   AST 170 (H) 11/12/2018 0430   ALT 173 (H) 11/12/2018 0430   ALKPHOS 426 (H) 11/12/2018 0430   BILITOT 4.0 (H) 11/12/2018 0430   GFRNONAA 8 (L) 11/12/2018 0430   GFRAA 9 (L) 11/12/2018 0430   Lipase     Component Value Date/Time   LIPASE 30 08/19/2018 0433       Studies/Results: No results found.  Anti-infectives: Anti-infectives (From admission, onward)   Start     Dose/Rate Route Frequency Ordered Stop   11/12/18 0030   meropenem (MERREM) 500 mg in sodium chloride 0.9 % 100 mL IVPB     500 mg 200 mL/hr over 30 Minutes Intravenous Every 24 hours 11/12/18 0027         Assessment/Plan HTN ESRD DM COPD CAd A Fib  H/O cholecystitis, s/p perc chole drain, Elevated LFTs -LFTs remain elevated today.  MRCP recently showed no choledocholithiasis, but I'm concerned that he still may have some stones in his duct considering what his LFTs are currently and his gallbladder is still decompressed with his perc chole drain. -await GI input and their plans -also needs cards to see him here for cardiac clearance so hopefully his lap chole can be done at some point this admit.  FEN - NPO, should stay NPO until seen by GI incase they have any procedure planned VTE - Heparin ID - Merrem   LOS: 1 day    Henreitta Cea , [facility] 11/12/2018, 9:21 AM Pager: [phone]

## 2018-11-12 NOTE — Consult Note (Addendum)
[facility] Renal Consultation Note    Indication for Consultation:  Management of ESRD/hemodialysis, anemia, hypertension/volume, and secondary hyperparathyroidism. PCP:  HPI: Christian Dalton is a 77 y.o. male with ESRD, CAD, Type 2 DM, OSA, A-fib who was admitted with symptomatic cholangitis.  Sent via EMS from dialysis to [facility] ED with AMS, N/V, fever, and abd pain. Found to be febrile to 103F with leukocytosis and ^ LFTs. Has had cholecystostomy drain in place since 08/14/18 due to acute cholecystitis. He was initially felt very high risk for surgery. Plan was to get cardiac clearance and then proceed with cholecystectomy as outpatient. He was given Zosyn and transferred to [facility] for surgical evaluation.  Today, he is feeling improved. Denies CP, dyspnea, abd pain at this time. Now on IV meropenem and surgery consulted with plan for cholecystectomy after cardiology evaluation.  Dialyzes on TTS schedule. Completed his full dialysis on 12/19 prior to presenting to outside hospital. Denies recent dialysis issues including weights or access.  Past Medical History:  Diagnosis Date  . Atrial fibrillation ([place])   . CAD (coronary artery disease)   . Cholecystitis   . COPD (chronic obstructive pulmonary disease) ([place])   . Diabetes mellitus without complication ([place])   . ESRD (end stage renal disease) on dialysis ([place])   . Hypertension   . OSA (obstructive sleep apnea)   . Renal disorder   . Spinal stenosis    Past Surgical History:  Procedure Laterality Date  . HAND SURGERY Right   . IR DIALY SHUNT INTRO NEEDLE/INTRACATH INITIAL W/IMG LEFT Left 08/20/2018  . IR PERC CHOLECYSTOSTOMY  08/19/2018  . IR RADIOLOGIST EVAL & MGMT  09/29/2018  . SHOULDER SURGERY Right   . TIBIA FRACTURE SURGERY     Family History  Problem Relation Age of Onset  . Heart disease Mother   . Heart disease Father   . Heart disease Sister    Social History:  reports that he has never smoked. He has  quit using smokeless tobacco. He reports previous alcohol use. He reports that he does not use drugs.  ROS: As per HPI otherwise negative.  Physical Exam: Vitals:   11/11/18 2100 11/11/18 2235 11/12/18 0418 11/12/18 0942  BP:  (!) 112/91 (!) 146/70 (!) 119/45  Pulse:  95 89 79  Resp:  18 20 20   Temp:  98.6 F (37 C) 98.1 F (36.7 C) 98.6 F (37 C)  TempSrc:  Oral Oral Oral  SpO2:  (!) 89% 97% 95%  Weight: 118.2 kg     Height: 5\' 10"  (1.778 m)        General: Well appearing, obese man, in no acute distress. Head: Normocephalic, atraumatic, sclera non-icteric, mucus membranes are moist. Neck: Supple without lymphadenopathy/masses. JVD not elevated. Lungs: Clear bilaterally to auscultation without wheezes, rales, or rhonchi. Breathing is unlabored. Heart: RRR with normal S1, S2. No murmurs, rubs, or gallops appreciated. Abdomen: Soft, non-tender, non-distended with normoactive bowel sounds. Cholecstostomy tube in RUQ with yellow bile present in bag. Musculoskeletal:  Strength and tone appear normal for age. Lower extremities: No edema or ischemic changes, no open wounds. Neuro: Alert and oriented X 3. Moves all extremities spontaneously. Psych:  Responds to questions appropriately with a normal affect. Dialysis Access: LUE AVF + bruit/thrill  Allergies  Allergen Reactions  . Avelox [Moxifloxacin Hcl] Other (See Comments)    unknown  . Sulfa Antibiotics Other (See Comments)    unknown  . Tetracyclines & Related Other (See Comments)  unknown   Prior to Admission medications   Medication Sig Start Date End Date Taking? Authorizing Provider  Amino Acids-Protein Hydrolys (FEEDING SUPPLEMENT, PRO-STAT SUGAR FREE 64,) LIQD Take 30 mLs by mouth 2 (two) times daily. 08/24/18   Elgergawy, Silver Huguenin, MD  amoxicillin (AMOXIL) 875 MG tablet Take 875 mg by mouth daily.    [provider]  amoxicillin-clavulanate (AUGMENTIN) 500-125 MG tablet Take 1 tablet (500 mg total) by mouth  daily. Patient not taking: Reported on 10/18/2018 08/24/18   Elgergawy, Silver Huguenin, MD  aspirin EC 81 MG tablet Take 81 mg every evening by mouth.    [provider]  atorvastatin (LIPITOR) 20 MG tablet Take 1 tablet (20 mg total) by mouth every evening. 08/24/18   Elgergawy, Silver Huguenin, MD  buPROPion (WELLBUTRIN XL) 150 MG 24 hr tablet Take 150 mg daily by mouth.    [provider]  calcitRIOL (ROCALTROL) 0.5 MCG capsule Take 2 capsules (1 mcg total) by mouth Every Tuesday,Thursday,and Saturday with dialysis. 08/26/18   Elgergawy, Silver Huguenin, MD  docusate sodium (COLACE) 100 MG capsule Take 100 mg 3 (three) times daily by mouth.    [provider]  escitalopram (LEXAPRO) 10 MG tablet Take 10 mg every evening by mouth. 08/30/15   [provider]  insulin detemir (LEVEMIR) 100 UNIT/ML injection Inject 0.14 mLs (14 Units total) into the skin 2 (two) times daily. 08/24/18   Elgergawy, Silver Huguenin, MD  insulin lispro (HUMALOG) 100 UNIT/ML injection Inject 0.05 mLs (5 Units total) into the skin 2 (two) times daily. 08/24/18   Elgergawy, Silver Huguenin, MD  lactulose, encephalopathy, (CHRONULAC) 10 GM/15ML SOLN Take 15-30 mLs by mouth daily before supper. 08/02/18   [provider]  levothyroxine (SYNTHROID, LEVOTHROID) 125 MCG tablet Take 125 mcg daily by mouth.    [provider]  lidocaine-prilocaine (EMLA) cream Apply 1 application topically See admin instructions. To access site (AVF) 1-2 hours before dialysis. Cover with occlusive dressing (Saran warp) 08/02/18   [provider]  linaclotide (LINZESS) 145 MCG CAPS capsule Take 145 mcg daily after supper by mouth.    [provider]  loratadine (CLARITIN) 10 MG tablet Take 10 mg every evening by mouth.    [provider]  montelukast (SINGULAIR) 10 MG tablet Take 10 mg every evening by mouth.    [provider]  oxyCODONE (OXY IR/ROXICODONE) 5 MG immediate release tablet Take 1 tablet (5  mg total) by mouth every 4 (four) hours as needed for severe pain. 08/24/18   Elgergawy, Silver Huguenin, MD  pregabalin (LYRICA) 100 MG capsule Take 100 mg 2 (two) times daily by mouth. 07/23/15   [provider]  sevelamer carbonate (RENVELA) 800 MG tablet Take 3 tablets (2,400 mg total) by mouth 3 (three) times daily with meals. 08/24/18   Elgergawy, Silver Huguenin, MD   Current Facility-Administered Medications  Medication Dose Route Frequency Provider Last Rate Last Dose  . 0.9 %  sodium chloride infusion   Intravenous Continuous Gala Romney L, MD 100 mL/hr at 11/12/18 0049    . heparin injection 5,000 Units  5,000 Units Subcutaneous Q8H Elwyn Reach, MD   5,000 Units at 11/12/18 0047  . insulin aspart (novoLOG) injection 0-5 Units  0-5 Units Subcutaneous QHS Garba, Mohammad L, MD      . insulin aspart (novoLOG) injection 0-9 Units  0-9 Units Subcutaneous TID WC Garba, Mohammad L, MD      . meropenem (MERREM) 500 mg in sodium  chloride 0.9 % 100 mL IVPB  500 mg Intravenous Q24H Erenest Blank, RPH 200 mL/hr at 11/12/18 0225 500 mg at 11/12/18 0225  . morphine 2 MG/ML injection 2 mg  2 mg Intravenous Q2H PRN Elwyn Reach, MD      . mupirocin ointment (BACTROBAN) 2 % 1 application  1 application Nasal BID Caren Griffins, MD   1 application at 91/63/84 1014  . ondansetron (ZOFRAN) tablet 4 mg  4 mg Oral Q6H PRN Elwyn Reach, MD       Or  . ondansetron (ZOFRAN) injection 4 mg  4 mg Intravenous Q6H PRN Elwyn Reach, MD       Labs: Basic Metabolic Panel: Recent Labs  Lab 11/11/18 2316 11/12/18 0430  NA 138 138  K 4.5 4.2  CL 97* 96*  CO2 26 28  GLUCOSE 156* 114*  BUN 19 23  CREATININE 6.02* 6.39*  CALCIUM 8.7* 9.0   Liver Function Tests: Recent Labs  Lab 11/12/18 0430  AST 170*  ALT 173*  ALKPHOS 426*  BILITOT 4.0*  PROT 6.2*  ALBUMIN 2.8*   CBC: Recent Labs  Lab 11/11/18 2310 11/12/18 0430  WBC 15.4* 10.9*  HGB 11.7* 12.2*  HCT 38.0* 41.2  MCV  92.2 93.0  PLT 290 263   Dialysis Orders:  TTS at [place] 4hr, 500/800, EDW 118kg, 2K/2.25Ca, AVF, heparin 5000 + 2500 mid-run bolus - Mircera 54mcg IV q 4 weeks - Calcitriol 83mcg PO q HD  Assessment/Plan: 1.  Acute cholangitis: + elevated LFTs. Surgery/GI consulted, possibly for cholecystectomy after cardiac clearance. On Meropenem, symptoms much improved today. 2.  ESRD:  Will continue HD per usual TTS schedule, next tomorrow. No heparin in case of surgery. 3.  Hypertension/volume: BP decent. Close to EDW. UF as tolerated. 4.  Anemia: Hgb 12.2. No ESA needed for now. Monitor. 5.  Metabolic bone disease: Ca ok, Phos pending. Continue home meds once cleared to eat. 6.  CAD 7.  Type 2 DM 8.  A-fib  [name], PA-C 11/12/2018, 10:49 AM  [facility] Pager: [phone]  Pt seen, examined and agree w A/P as above.  [name] MD [employer] pager [phone]   11/12/2018, 12:53 PM

## 2018-11-13 DIAGNOSIS — K8309 Other cholangitis: Secondary | ICD-10-CM

## 2018-11-13 LAB — CBC
HCT: 37.7 % — ABNORMAL LOW (ref 39.0–52.0)
Hemoglobin: 11.5 g/dL — ABNORMAL LOW (ref 13.0–17.0)
MCH: 28.6 pg (ref 26.0–34.0)
MCHC: 30.5 g/dL (ref 30.0–36.0)
MCV: 93.8 fL (ref 80.0–100.0)
Platelets: 288 10*3/uL (ref 150–400)
RBC: 4.02 MIL/uL — ABNORMAL LOW (ref 4.22–5.81)
RDW: 18 % — ABNORMAL HIGH (ref 11.5–15.5)
WBC: 7.2 10*3/uL (ref 4.0–10.5)
nRBC: 0 % (ref 0.0–0.2)

## 2018-11-13 LAB — COMPREHENSIVE METABOLIC PANEL
ALT: 122 U/L — ABNORMAL HIGH (ref 0–44)
AST: 80 U/L — ABNORMAL HIGH (ref 15–41)
Albumin: 2.6 g/dL — ABNORMAL LOW (ref 3.5–5.0)
Alkaline Phosphatase: 373 U/L — ABNORMAL HIGH (ref 38–126)
Anion gap: 16 — ABNORMAL HIGH (ref 5–15)
BUN: 33 mg/dL — ABNORMAL HIGH (ref 8–23)
CO2: 24 mmol/L (ref 22–32)
Calcium: 8.8 mg/dL — ABNORMAL LOW (ref 8.9–10.3)
Chloride: 97 mmol/L — ABNORMAL LOW (ref 98–111)
Creatinine, Ser: 8.02 mg/dL — ABNORMAL HIGH (ref 0.61–1.24)
GFR calc Af Amer: 7 mL/min — ABNORMAL LOW (ref >60)
GFR calc non Af Amer: 6 mL/min — ABNORMAL LOW (ref >60)
Glucose, Bld: 94 mg/dL (ref 70–99)
Potassium: 3.9 mmol/L (ref 3.5–5.1)
Sodium: 137 mmol/L (ref 135–145)
Total Bilirubin: 2.5 mg/dL — ABNORMAL HIGH (ref 0.3–1.2)
Total Protein: 6.1 g/dL — ABNORMAL LOW (ref 6.5–8.1)

## 2018-11-13 LAB — GLUCOSE, CAPILLARY
Glucose-Capillary: 93 mg/dL (ref 70–99)
Glucose-Capillary: 78 mg/dL (ref 70–99)
Glucose-Capillary: 116 mg/dL — ABNORMAL HIGH (ref 70–99)
Glucose-Capillary: 99 mg/dL (ref 70–99)

## 2018-11-13 MED ORDER — LINACLOTIDE 145 MCG PO CAPS
290.0000 ug | ORAL_CAPSULE | Freq: Every day | ORAL | Status: DC
  Administered 2018-11-13: 290 ug via ORAL
  Filled 2018-11-13: qty 2

## 2018-11-13 MED ORDER — LACTULOSE 10 GM/15ML PO SOLN
10.0000 g | Freq: Every day | ORAL | Status: DC
  Administered 2018-11-13: 10 g via ORAL
  Filled 2018-11-13: qty 15

## 2018-11-13 MED ORDER — POLYETHYLENE GLYCOL 3350 17 G PO PACK
17.0000 g | PACK | Freq: Every day | ORAL | Status: DC
  Administered 2018-11-13: 17 g via ORAL
  Filled 2018-11-13: qty 1

## 2018-11-13 NOTE — Progress Notes (Signed)
PROGRESS NOTE    Christian Dalton  AQT:[id number] DOB: April 29, 1941 DOA: 11/11/2018 PCP: Venetia Maxon, Sharon Mt, MD     Brief Narrative:  Christian Dalton is a 77 year old male with history of ESRD on HD MWF, CAD, hypothyroidism, coronary artery disease, type 2 diabetes mellitus, hypertension, OSA, COPD who was admitted to the hospital with abdominal pain.  He was recently hospitalized and discharged in September 2019 when he was diagnosed with acute cholecystitis.  He was deemed high risk for surgery and he underwent cholecystostomy tube placement by IR with plans for outpatient follow-up with surgery.  He went to [facility] the day prior to admission with fever, chills nausea and vomiting.  He was having worsening abdominal pain.  His LFTs were elevated and he was transferred here.   New events last 24 hours / Subjective: No acute events overnight, underwent hemodialysis this morning without any issue.  Denies any chest pain, abdominal pain.  Wondering if he can eat  Assessment & Plan:   Principal Problem:   Acute cholangitis Active Problems:   Acute cholecystitis   ESRD (end stage renal disease) on dialysis (HCC)   DM2 (diabetes mellitus, type 2) (HCC)   HTN (hypertension)   CAD (coronary artery disease)   Obesity, Class III, BMI 40-49.9 (morbid obesity) ([place])   Choledocholithiasis -Appreciate GI, general surgery input -Cardiology evaluated for preop clearance -Planning for cholecystectomy tomorrow -Continue merrem   ESRD on dialysis -Nephrology following, hemodialysis today  Coronary artery disease -Appreciate cardiology for preop clearance  Essential hypertension -Blood pressure well controlled  OSA -Continue CPAP qhs   Hypothyroidism -Continue Synthroid  Hyperlipidemia -Continue statin  DM type 2 with ESRD -Continue SSI, levemir     DVT prophylaxis: Heparin subq  Code Status: Full code Family Communication: No family at bedside Disposition Plan: Planning  for OR tomorrow for lap cholecystectomy   Consultants:   GI  General surgery  Cardiology  Nephrology  Procedures:   None   Antimicrobials:  Anti-infectives (From admission, onward)   Start     Dose/Rate Route Frequency Ordered Stop   11/12/18 0030  meropenem (MERREM) 500 mg in sodium chloride 0.9 % 100 mL IVPB     500 mg 200 mL/hr over 30 Minutes Intravenous Every 24 hours 11/12/18 0027         Objective: Vitals:   11/13/18 0900 11/13/18 0930 11/13/18 1000 11/13/18 1107  BP: 136/68 117/63 129/67 (!) 141/71  Pulse: 75 79 80 85  Resp:   16 18  Temp:   98.1 F (36.7 C) 98.2 F (36.8 C)  TempSrc:   Oral Oral  SpO2:   96% 94%  Weight:      Height:        Intake/Output Summary (Last 24 hours) at 11/13/2018 1347 Last data filed at 11/13/2018 1000 Gross per 24 hour  Intake 1340 ml  Output 2205 ml  Net -865 ml   Filed Weights   11/11/18 2100 11/12/18 2103 11/13/18 0645  Weight: 118.2 kg 118.2 kg 121.6 kg    Examination:  General exam: Appears calm and comfortable  Respiratory system: Clear to auscultation. Respiratory effort normal. Cardiovascular system: S1 & S2 heard, RRR. No JVD, murmurs, rubs, gallops or clicks. No pedal edema. Gastrointestinal system: Abdomen is nondistended, soft and nontender. No organomegaly or masses felt. Normal bowel sounds heard. +Chole tube in place  Central nervous system: Alert and oriented. No focal neurological deficits. Extremities: Symmetric 5 x 5 power. Skin: No rashes, lesions or ulcers Psychiatry:  Judgement and insight appear normal. Mood & affect appropriate.   Data Reviewed: I have personally reviewed following labs and imaging studies  CBC: Recent Labs  Lab 11/11/18 2310 11/12/18 0430 11/13/18 0425  WBC 15.4* 10.9* 7.2  HGB 11.7* 12.2* 11.5*  HCT 38.0* 41.2 37.7*  MCV 92.2 93.0 93.8  PLT 290 263 026   Basic Metabolic Panel: Recent Labs  Lab 11/11/18 2316 11/12/18 0430 11/13/18 0425  NA 138 138 137  K  4.5 4.2 3.9  CL 97* 96* 97*  CO2 26 28 24   GLUCOSE 156* 114* 94  BUN 19 23 33*  CREATININE 6.02* 6.39* 8.02*  CALCIUM 8.7* 9.0 8.8*   GFR: Estimated Creatinine Clearance: 10.4 mL/min (A) (by C-G formula based on SCr of 8.02 mg/dL (H)). Liver Function Tests: Recent Labs  Lab 11/12/18 0430 11/13/18 0425  AST 170* 80*  ALT 173* 122*  ALKPHOS 426* 373*  BILITOT 4.0* 2.5*  PROT 6.2* 6.1*  ALBUMIN 2.8* 2.6*   Recent Labs  Lab 11/12/18 1021  LIPASE 19   No results for input(s): AMMONIA in the last 168 hours. Coagulation Profile: No results for input(s): INR, PROTIME in the last 168 hours. Cardiac Enzymes: No results for input(s): CKTOTAL, CKMB, CKMBINDEX, TROPONINI in the last 168 hours. BNP (last 3 results) No results for input(s): PROBNP in the last 8760 hours. HbA1C: No results for input(s): HGBA1C in the last 72 hours. CBG: Recent Labs  Lab 11/12/18 1126 11/12/18 1642 11/12/18 2102 11/13/18 0621 11/13/18 1111  GLUCAP 102* 132* 103* 93 78   Lipid Profile: No results for input(s): CHOL, HDL, LDLCALC, TRIG, CHOLHDL, LDLDIRECT in the last 72 hours. Thyroid Function Tests: No results for input(s): TSH, T4TOTAL, FREET4, T3FREE, THYROIDAB in the last 72 hours. Anemia Panel: No results for input(s): VITAMINB12, FOLATE, FERRITIN, TIBC, IRON, RETICCTPCT in the last 72 hours. Sepsis Labs: No results for input(s): PROCALCITON, LATICACIDVEN in the last 168 hours.  Recent Results (from the past 240 hour(s))  Surgical PCR screen     Status: None   Collection Time: 11/12/18  8:41 AM  Result Value Ref Range Status   MRSA, PCR NEGATIVE NEGATIVE Final   Staphylococcus aureus NEGATIVE NEGATIVE Final    Comment: (NOTE) The Xpert SA Assay (FDA approved for NASAL specimens in patients 81 years of age and older), is one component of a comprehensive surveillance program. It is not intended to diagnose infection nor to guide or monitor treatment. Performed at [facility], [place] [address], [place], [place] [address]        Radiology Studies: No results found.    Scheduled Meds: . atorvastatin  20 mg Oral QPM  . Chlorhexidine Gluconate Cloth  6 each Topical Q0600  . gabapentin  300 mg Oral QHS  . heparin  5,000 Units Subcutaneous Q8H  . insulin aspart  0-5 Units Subcutaneous QHS  . insulin aspart  0-9 Units Subcutaneous TID WC  . insulin detemir  5 Units Subcutaneous BID  . levothyroxine  125 mcg Oral Q0600  . mupirocin ointment  1 application Nasal BID   Continuous Infusions: . meropenem (MERREM) IV 500 mg (11/13/18 0040)     LOS: 2 days    Time spent: 40 minutes   Dessa Phi, DO [facility] [email or web address] Password [email or web address] 11/13/2018, 1:47 PM

## 2018-11-13 NOTE — Progress Notes (Addendum)
  [facility] Progress Note   Subjective:  Seen on HD, 2L UF goal and tolerating. C/o abd pain. Looks like was cleared for surgery by cardiology, awaiting further plan. Denies CP/dyspnea at this time.   Objective Vitals:   11/13/18 0700 11/13/18 0730 11/13/18 0800 11/13/18 0830  BP: 138/67 137/64 (!) 151/72 (!) 141/75  Pulse: 74 75 77 76  Resp:      Temp:      TempSrc:      SpO2:      Weight:      Height:       Physical Exam General: Obese man, NAD. Lying flat on back. Heart: RRR; no murmur Lungs: CTA anteriorly Abdomen: soft, mild tenderness. Cholecystomy tube in place. Extremities: No LE edema Dialysis Access: LUE AVF + thrill (cannulated)  Additional Objective Labs: Basic Metabolic Panel: Recent Labs  Lab 11/11/18 2316 11/12/18 0430 11/13/18 0425  NA 138 138 137  K 4.5 4.2 3.9  CL 97* 96* 97*  CO2 26 28 24   GLUCOSE 156* 114* 94  BUN 19 23 33*  CREATININE 6.02* 6.39* 8.02*  CALCIUM 8.7* 9.0 8.8*   Liver Function Tests: Recent Labs  Lab 11/12/18 0430 11/13/18 0425  AST 170* 80*  ALT 173* 122*  ALKPHOS 426* 373*  BILITOT 4.0* 2.5*  PROT 6.2* 6.1*  ALBUMIN 2.8* 2.6*   Recent Labs  Lab 11/12/18 1021  LIPASE 19   CBC: Recent Labs  Lab 11/11/18 2310 11/12/18 0430 11/13/18 0425  WBC 15.4* 10.9* 7.2  HGB 11.7* 12.2* 11.5*  HCT 38.0* 41.2 37.7*  MCV 92.2 93.0 93.8  PLT 290 263 288   Medications: . sodium chloride 100 mL/hr at 11/12/18 2326  . meropenem (MERREM) IV 500 mg (11/13/18 0040)   . atorvastatin  20 mg Oral QPM  . Chlorhexidine Gluconate Cloth  6 each Topical Q0600  . gabapentin  300 mg Oral QHS  . heparin  5,000 Units Subcutaneous Q8H  . insulin aspart  0-5 Units Subcutaneous QHS  . insulin aspart  0-9 Units Subcutaneous TID WC  . insulin detemir  5 Units Subcutaneous BID  . levothyroxine  125 mcg Oral Q0600  . mupirocin ointment  1 application Nasal BID    Dialysis Orders: TTS at [place] 4hr, 500/800, EDW  118kg, 2K/2.25Ca, AVF, heparin 5000 + 2500 mid-run bolus - Mircera 20mcg IV q 4 weeks - Calcitriol 56mcg PO q HD  Assessment/Plan: 1.  Acute cholangitis: + elevated LFTs -> trending down today. Surgery/GI consulted, possibly for lap cholecystectomy. On Meropenem. 2.  ESRD:  Will continue HD per usual TTS schedule, HD today. ** HOLIDAY HD SCHEDULE: USUAL TTS = MTS, NEXT SCHEDULED HD Monday** 3.  Hypertension/volume: BP decent. Close to EDW, 2L UF goal today. 4.  Anemia: Hgb 11.5. No ESA needed for now. Monitor. 5.  Metabolic bone disease: Ca ok, Phos pending. Continue home meds once cleared to eat. 6.  CAD 7.  Type 2 DM 8.  A-fib  [name], PA-C 11/13/2018, 8:56 AM  [facility] Pager: [phone]  Pt seen, examined and agree w A/P as above. Seen by surgery, tentative plan is for OR tomorrow for lap chole.  HD today.  [name] MD [employer] pager [phone]   11/13/2018, 1:25 PM

## 2018-11-13 NOTE — Progress Notes (Signed)
Feeling well today.  No abd pn.  S/p dialysis treatment.  LFTs about 40% decreased from yesterday.  I suspect pt may have passed a stone, which may either have been undetected on MRCP (which is not 100% sensitiive for stones), or might have entered the duct following the MRCP.  Lipase nl.  WBC nl.  Plans for surgery noted.  Will sign off--please call us back if IOC is positive or if we can be of further assistance in this patient's care.  Cleotis Nipper, M.D. Pager [phone] If no answer or after 5 PM call [phone]

## 2018-11-13 NOTE — Progress Notes (Signed)
Patient ID: Christian Dalton, male   DOB: 11-19-1941, 77 y.o.   MRN: [id number]   Acute Care Surgery Service Progress Note:    Chief Complaint/Subjective: Pt seen in HD No real c/o No n/v.  Feels better.   Objective: Vital signs in last 24 hours: Temp:  [97.9 F (36.6 C)-98.4 F (36.9 C)] 97.9 F (36.6 C) (12/21 0645) Pulse Rate:  [72-88] 75 (12/21 0900) Resp:  [16-20] 16 (12/21 0645) BP: (112-151)/(54-75) 136/68 (12/21 0900) SpO2:  [92 %-99 %] 95 % (12/21 0524) Weight:  [118.2 kg-121.6 kg] 121.6 kg (12/21 0645) Last BM Date: 11/10/18  Intake/Output from previous day: 12/20 0701 - 12/21 0700 In: 4562 [P.O.:778; I.V.:800] Out: 25 [Drains:25] Intake/Output this shift: No intake/output data recorded.  Obese  Lungs: cta, nonlabored  Cardiovascular: reg  Abd: soft, obese, min to NT; GB drain - bile  Extremities: no edema, +SCDs  Neuro: alert, nonfocal  Lab Results: CBC  Recent Labs    11/12/18 0430 11/13/18 0425  WBC 10.9* 7.2  HGB 12.2* 11.5*  HCT 41.2 37.7*  PLT 263 288   BMET Recent Labs    11/12/18 0430 11/13/18 0425  NA 138 137  K 4.2 3.9  CL 96* 97*  CO2 28 24  GLUCOSE 114* 94  BUN 23 33*  CREATININE 6.39* 8.02*  CALCIUM 9.0 8.8*   LFT Hepatic Function Latest Ref Rng & Units 11/13/2018 11/12/2018 08/24/2018  Total Protein 6.5 - 8.1 g/dL 6.1(L) 6.2(L) -  Albumin 3.5 - 5.0 g/dL 2.6(L) 2.8(L) 2.5(L)  AST 15 - 41 U/L 80(H) 170(H) -  ALT 0 - 44 U/L 122(H) 173(H) -  Alk Phosphatase 38 - 126 U/L 373(H) 426(H) -  Total Bilirubin 0.3 - 1.2 mg/dL 2.5(H) 4.0(H) -  Bilirubin, Direct 0.0 - 0.2 mg/dL - - -   PT/INR No results for input(s): LABPROT, INR in the last 72 hours. ABG No results for input(s): PHART, HCO3 in the last 72 hours.  Invalid input(s): PCO2, PO2  Studies/Results:  Anti-infectives: Anti-infectives (From admission, onward)   Start     Dose/Rate Route Frequency Ordered Stop   11/12/18 0030  meropenem (MERREM) 500 mg in sodium  chloride 0.9 % 100 mL IVPB     500 mg 200 mL/hr over 30 Minutes Intravenous Every 24 hours 11/12/18 0027        Medications: Scheduled Meds: . atorvastatin  20 mg Oral QPM  . Chlorhexidine Gluconate Cloth  6 each Topical Q0600  . gabapentin  300 mg Oral QHS  . heparin  5,000 Units Subcutaneous Q8H  . insulin aspart  0-5 Units Subcutaneous QHS  . insulin aspart  0-9 Units Subcutaneous TID WC  . insulin detemir  5 Units Subcutaneous BID  . levothyroxine  125 mcg Oral Q0600  . mupirocin ointment  1 application Nasal BID   Continuous Infusions: . sodium chloride 100 mL/hr at 11/12/18 2326  . meropenem (MERREM) IV 500 mg (11/13/18 0040)   PRN Meds:.ondansetron **OR** ondansetron (ZOFRAN) IV, oxyCODONE-acetaminophen  Assessment/Plan: Patient Active Problem List   Diagnosis Date Noted  . Acute cholangitis 11/11/2018  . Acute cholecystitis 08/17/2018  . ESRD (end stage renal disease) on dialysis ([place]) 08/17/2018  . DM2 (diabetes mellitus, type 2) ([place]) 08/17/2018  . HTN (hypertension) 08/17/2018  . CAD (coronary artery disease) 08/17/2018  . Obesity, Class III, BMI 40-49.9 (morbid obesity) ([place]) 08/17/2018   HTN ESRD DM COPD CAd A Fib  H/O cholecystitis, s/p perc chole drain, Elevated LFTs -LFTs trending down.  -  will plan Lap chole with ioc Sunday (assuming no emergencies) with Dr Ninfa Linden -repeats labs in am -appreciate cards input - discussed with pt.    I discussed laparoscopic cholecystectomy with IOC in detail.   We discussed the risks and benefits of a laparoscopic cholecystectomy including, but not limited to bleeding, infection, injury to surrounding structures such as the intestine or liver, bile leak, retained gallstones, need to convert to an open procedure, prolonged diarrhea, blood clots such as  DVT, common bile duct injury, anesthesia risks, cardiac/pulm events, and possible need for additional procedures.  We discussed the typical post-operative recovery  course. I explained that the likelihood of improvement of their symptoms is good.   FEN - clears today, NPO x meds after MN tonight VTE - Heparin ID - Merrem Disposition:  LOS: 2 days    Leighton Ruff. Redmond Pulling, MD, FACS General, Bariatric, & Minimally Invasive Surgery [phone] [facility]

## 2018-11-13 NOTE — Progress Notes (Signed)
Pt refused CPAP.  Eleanora Neighbor, RN

## 2018-11-14 ENCOUNTER — Encounter (HOSPITAL_COMMUNITY)
Admission: AD | Disposition: A | Discharge status: Home / Self Care | Payer: Self-pay | Source: Home / Self Care | Attending: Internal Medicine

## 2018-11-14 ENCOUNTER — Inpatient Hospital Stay (HOSPITAL_COMMUNITY): Payer: Medicare Other | Admitting: Certified Registered Nurse Anesthetist

## 2018-11-14 ENCOUNTER — Encounter (HOSPITAL_COMMUNITY): Payer: Self-pay | Admitting: Certified Registered Nurse Anesthetist

## 2018-11-14 DIAGNOSIS — K8050 Calculus of bile duct without cholangitis or cholecystitis without obstruction: Secondary | ICD-10-CM

## 2018-11-14 HISTORY — PX: CHOLECYSTECTOMY: SHX55

## 2018-11-14 LAB — CBC
HCT: 37.2 % — ABNORMAL LOW (ref 39.0–52.0)
Hemoglobin: 11.3 g/dL — ABNORMAL LOW (ref 13.0–17.0)
MCH: 28 pg (ref 26.0–34.0)
MCHC: 30.4 g/dL (ref 30.0–36.0)
MCV: 92.1 fL (ref 80.0–100.0)
Platelets: 324 10*3/uL (ref 150–400)
RBC: 4.04 MIL/uL — ABNORMAL LOW (ref 4.22–5.81)
RDW: 17.7 % — ABNORMAL HIGH (ref 11.5–15.5)
WBC: 7.1 10*3/uL (ref 4.0–10.5)
nRBC: 0 % (ref 0.0–0.2)

## 2018-11-14 LAB — COMPREHENSIVE METABOLIC PANEL
ALT: 83 U/L — ABNORMAL HIGH (ref 0–44)
AST: 36 U/L (ref 15–41)
Albumin: 2.6 g/dL — ABNORMAL LOW (ref 3.5–5.0)
Alkaline Phosphatase: 354 U/L — ABNORMAL HIGH (ref 38–126)
Anion gap: 16 — ABNORMAL HIGH (ref 5–15)
BUN: 28 mg/dL — ABNORMAL HIGH (ref 8–23)
CO2: 25 mmol/L (ref 22–32)
Calcium: 8.8 mg/dL — ABNORMAL LOW (ref 8.9–10.3)
Chloride: 96 mmol/L — ABNORMAL LOW (ref 98–111)
Creatinine, Ser: 7.31 mg/dL — ABNORMAL HIGH (ref 0.61–1.24)
GFR calc Af Amer: 8 mL/min — ABNORMAL LOW (ref >60)
GFR calc non Af Amer: 7 mL/min — ABNORMAL LOW (ref >60)
Glucose, Bld: 123 mg/dL — ABNORMAL HIGH (ref 70–99)
Potassium: 3.7 mmol/L (ref 3.5–5.1)
Sodium: 137 mmol/L (ref 135–145)
Total Bilirubin: 1.2 mg/dL (ref 0.3–1.2)
Total Protein: 6 g/dL — ABNORMAL LOW (ref 6.5–8.1)

## 2018-11-14 LAB — GLUCOSE, CAPILLARY
Glucose-Capillary: 96 mg/dL (ref 70–99)
Glucose-Capillary: 150 mg/dL — ABNORMAL HIGH (ref 70–99)
Glucose-Capillary: 157 mg/dL — ABNORMAL HIGH (ref 70–99)
Glucose-Capillary: 298 mg/dL — ABNORMAL HIGH (ref 70–99)

## 2018-11-14 LAB — ABO/RH: ABO/RH(D): A POS

## 2018-11-14 LAB — TYPE AND SCREEN
ABO/RH(D): A POS
Antibody Screen: NEGATIVE

## 2018-11-14 SURGERY — LAPAROSCOPIC CHOLECYSTECTOMY WITH INTRAOPERATIVE CHOLANGIOGRAM
Anesthesia: General | Site: Abdomen

## 2018-11-14 MED ORDER — DEXAMETHASONE SODIUM PHOSPHATE 4 MG/ML IJ SOLN
INTRAMUSCULAR | Status: DC | PRN
  Administered 2018-11-14: 5 mg via INTRAVENOUS

## 2018-11-14 MED ORDER — SODIUM CHLORIDE 0.9 % IR SOLN
Status: DC | PRN
  Administered 2018-11-14: 1000 mL

## 2018-11-14 MED ORDER — FENTANYL CITRATE (PF) 100 MCG/2ML IJ SOLN
25.0000 ug | INTRAMUSCULAR | Status: DC | PRN
  Administered 2018-11-14 (×2): 50 ug via INTRAVENOUS

## 2018-11-14 MED ORDER — FENTANYL CITRATE (PF) 100 MCG/2ML IJ SOLN
INTRAMUSCULAR | Status: DC | PRN
  Administered 2018-11-14: 50 ug via INTRAVENOUS
  Administered 2018-11-14: 100 ug via INTRAVENOUS
  Administered 2018-11-14: 50 ug via INTRAVENOUS

## 2018-11-14 MED ORDER — ONDANSETRON HCL 4 MG/2ML IJ SOLN
4.0000 mg | Freq: Once | INTRAMUSCULAR | Status: AC | PRN
  Administered 2018-11-14: 4 mg via INTRAVENOUS

## 2018-11-14 MED ORDER — ONDANSETRON HCL 4 MG/2ML IJ SOLN
INTRAMUSCULAR | Status: AC
  Filled 2018-11-14: qty 2

## 2018-11-14 MED ORDER — PHENYLEPHRINE HCL 10 MG/ML IJ SOLN
INTRAMUSCULAR | Status: DC | PRN
  Administered 2018-11-14: 120 ug via INTRAVENOUS
  Administered 2018-11-14 (×2): 80 ug via INTRAVENOUS

## 2018-11-14 MED ORDER — ROCURONIUM BROMIDE 50 MG/5ML IV SOSY
PREFILLED_SYRINGE | INTRAVENOUS | Status: AC
  Filled 2018-11-14: qty 10

## 2018-11-14 MED ORDER — LIDOCAINE 2% (20 MG/ML) 5 ML SYRINGE
INTRAMUSCULAR | Status: AC
  Filled 2018-11-14: qty 10

## 2018-11-14 MED ORDER — CISATRACURIUM BESYLATE 20 MG/10ML IV SOLN
INTRAVENOUS | Status: AC
  Filled 2018-11-14: qty 10

## 2018-11-14 MED ORDER — SODIUM CHLORIDE 0.9 % IV SOLN
INTRAVENOUS | Status: DC | PRN
  Administered 2018-11-14: 10:00:00 via INTRAVENOUS

## 2018-11-14 MED ORDER — 0.9 % SODIUM CHLORIDE (POUR BTL) OPTIME
TOPICAL | Status: DC | PRN
  Administered 2018-11-14: 1000 mL

## 2018-11-14 MED ORDER — GLYCOPYRROLATE 0.2 MG/ML IJ SOLN
INTRAMUSCULAR | Status: DC | PRN
  Administered 2018-11-14: 0.2 mg via INTRAVENOUS
  Administered 2018-11-14: .6 mg via INTRAVENOUS

## 2018-11-14 MED ORDER — FENTANYL CITRATE (PF) 250 MCG/5ML IJ SOLN
INTRAMUSCULAR | Status: AC
  Filled 2018-11-14: qty 5

## 2018-11-14 MED ORDER — SUCCINYLCHOLINE CHLORIDE 20 MG/ML IJ SOLN
INTRAMUSCULAR | Status: DC | PRN
  Administered 2018-11-14: 120 mg via INTRAVENOUS

## 2018-11-14 MED ORDER — MORPHINE SULFATE (PF) 2 MG/ML IV SOLN
2.0000 mg | Freq: Once | INTRAVENOUS | Status: AC
  Administered 2018-11-14: 2 mg via INTRAVENOUS
  Filled 2018-11-14: qty 1

## 2018-11-14 MED ORDER — CISATRACURIUM BESYLATE (PF) 10 MG/5ML IV SOLN
INTRAVENOUS | Status: DC | PRN
  Administered 2018-11-14: 8 mg via INTRAVENOUS
  Administered 2018-11-14 (×5): 2 mg via INTRAVENOUS

## 2018-11-14 MED ORDER — NEOSTIGMINE METHYLSULFATE 10 MG/10ML IV SOLN
INTRAVENOUS | Status: DC | PRN
  Administered 2018-11-14: 4 mg via INTRAVENOUS
  Administered 2018-11-14: 1 mg via INTRAVENOUS

## 2018-11-14 MED ORDER — HEPARIN SODIUM (PORCINE) 5000 UNIT/ML IJ SOLN
5000.0000 [IU] | Freq: Three times a day (TID) | INTRAMUSCULAR | Status: DC
  Administered 2018-11-15: 5000 [IU] via SUBCUTANEOUS

## 2018-11-14 MED ORDER — SUCCINYLCHOLINE CHLORIDE 200 MG/10ML IV SOSY
PREFILLED_SYRINGE | INTRAVENOUS | Status: AC
  Filled 2018-11-14: qty 10

## 2018-11-14 MED ORDER — HYDRALAZINE HCL 20 MG/ML IJ SOLN: 5.0000 mg | INTRAMUSCULAR | Status: DC | PRN

## 2018-11-14 MED ORDER — BUPIVACAINE-EPINEPHRINE 0.25% -1:200000 IJ SOLN
INTRAMUSCULAR | Status: DC | PRN
  Administered 2018-11-14: 10 mL

## 2018-11-14 MED ORDER — FENTANYL CITRATE (PF) 100 MCG/2ML IJ SOLN
INTRAMUSCULAR | Status: AC
  Administered 2018-11-14: 50 ug via INTRAVENOUS
  Filled 2018-11-14: qty 2

## 2018-11-14 MED ORDER — PROPOFOL 10 MG/ML IV BOLUS
INTRAVENOUS | Status: AC
  Filled 2018-11-14: qty 20

## 2018-11-14 MED ORDER — IOPAMIDOL (ISOVUE-300) INJECTION 61%
INTRAVENOUS | Status: AC
  Filled 2018-11-14: qty 50

## 2018-11-14 MED ORDER — PROPOFOL 10 MG/ML IV BOLUS
INTRAVENOUS | Status: DC | PRN
  Administered 2018-11-14: 50 mg via INTRAVENOUS
  Administered 2018-11-14: 100 mg via INTRAVENOUS

## 2018-11-14 MED ORDER — LIDOCAINE 2% (20 MG/ML) 5 ML SYRINGE
INTRAMUSCULAR | Status: DC | PRN
  Administered 2018-11-14: 60 mg via INTRAVENOUS

## 2018-11-14 MED ORDER — DEXAMETHASONE SODIUM PHOSPHATE 10 MG/ML IJ SOLN
INTRAMUSCULAR | Status: AC
  Filled 2018-11-14: qty 1

## 2018-11-14 SURGICAL SUPPLY — 46 items
APPLIER CLIP 5 13 M/L LIGAMAX5 (MISCELLANEOUS) ×2
BLADE CLIPPER SURG (BLADE) ×2 IMPLANT
CANISTER SUCT 3000ML PPV (MISCELLANEOUS) ×2 IMPLANT
CHLORAPREP W/TINT 26ML (MISCELLANEOUS) ×2 IMPLANT
CLIP APPLIE 5 13 M/L LIGAMAX5 (MISCELLANEOUS) ×1 IMPLANT
COVER MAYO STAND STRL (DRAPES) ×2 IMPLANT
COVER SURGICAL LIGHT HANDLE (MISCELLANEOUS) ×2 IMPLANT
COVER WAND RF STERILE (DRAPES) IMPLANT
DERMABOND ADVANCED (GAUZE/BANDAGES/DRESSINGS) ×1
DERMABOND ADVANCED .7 DNX12 (GAUZE/BANDAGES/DRESSINGS) ×1 IMPLANT
DRAIN CHANNEL 19F RND (DRAIN) ×2 IMPLANT
DRAPE C-ARM 42X72 X-RAY (DRAPES) IMPLANT
ELECT REM PT RETURN 9FT ADLT (ELECTROSURGICAL) ×2
ELECTRODE REM PT RTRN 9FT ADLT (ELECTROSURGICAL) ×1 IMPLANT
EVACUATOR SILICONE 100CC (DRAIN) ×2 IMPLANT
GAUZE SPONGE 4X4 12PLY STRL (GAUZE/BANDAGES/DRESSINGS) ×2 IMPLANT
GLOVE BIOGEL PI IND STRL 8 (GLOVE) ×1 IMPLANT
GLOVE BIOGEL PI INDICATOR 8 (GLOVE) ×1
GLOVE ECLIPSE 7.5 STRL STRAW (GLOVE) ×2 IMPLANT
GLOVE SURG SIGNA 7.5 PF LTX (GLOVE) ×2 IMPLANT
GOWN STRL REUS W/ TWL LRG LVL3 (GOWN DISPOSABLE) ×2 IMPLANT
GOWN STRL REUS W/ TWL XL LVL3 (GOWN DISPOSABLE) ×1 IMPLANT
GOWN STRL REUS W/TWL LRG LVL3 (GOWN DISPOSABLE) ×2
GOWN STRL REUS W/TWL XL LVL3 (GOWN DISPOSABLE) ×1
KIT BASIN OR (CUSTOM PROCEDURE TRAY) ×2 IMPLANT
KIT TURNOVER KIT B (KITS) ×2 IMPLANT
NS IRRIG 1000ML POUR BTL (IV SOLUTION) ×2 IMPLANT
PAD ARMBOARD 7.5X6 YLW CONV (MISCELLANEOUS) ×2 IMPLANT
POUCH SPECIMEN RETRIEVAL 10MM (ENDOMECHANICALS) ×2 IMPLANT
SCISSORS LAP 5X35 DISP (ENDOMECHANICALS) ×2 IMPLANT
SET CHOLANGIOGRAPH 5 50 .035 (SET/KITS/TRAYS/PACK) IMPLANT
SET IRRIG TUBING LAPAROSCOPIC (IRRIGATION / IRRIGATOR) ×2 IMPLANT
SLEEVE ENDOPATH XCEL 5M (ENDOMECHANICALS) ×4 IMPLANT
SPECIMEN JAR SMALL (MISCELLANEOUS) ×2 IMPLANT
STRIP CLOSURE SKIN 1/2X4 (GAUZE/BANDAGES/DRESSINGS) ×2 IMPLANT
SURGICEL SNOW 2X4 (HEMOSTASIS) ×2 IMPLANT
SUT ETHILON 2 0 FS 18 (SUTURE) ×2 IMPLANT
SUT MNCRL AB 4-0 PS2 18 (SUTURE) ×2 IMPLANT
TAPE CLOTH SURG 4X10 WHT LF (GAUZE/BANDAGES/DRESSINGS) ×2 IMPLANT
TOWEL OR 17X24 6PK STRL BLUE (TOWEL DISPOSABLE) ×2 IMPLANT
TOWEL OR 17X26 10 PK STRL BLUE (TOWEL DISPOSABLE) ×2 IMPLANT
TRAY LAPAROSCOPIC MC (CUSTOM PROCEDURE TRAY) ×2 IMPLANT
TROCAR XCEL BLUNT TIP 100MML (ENDOMECHANICALS) ×2 IMPLANT
TROCAR XCEL NON-BLD 5MMX100MML (ENDOMECHANICALS) ×2 IMPLANT
TUBING INSUFFLATION (TUBING) ×2 IMPLANT
WATER STERILE IRR 1000ML POUR (IV SOLUTION) ×2 IMPLANT

## 2018-11-14 NOTE — Anesthesia Postprocedure Evaluation (Signed)
Anesthesia Post Note  Patient: Traeger Upshur  Procedure(s) Performed: LAPAROSCOPIC CHOLECYSTECTOMY WITH INTRAOPERATIVE CHOLANGIOGRAM (N/A Abdomen)     Patient location during evaluation: PACU Anesthesia Type: General Level of consciousness: awake Pain management: pain level controlled Vital Signs Assessment: post-procedure vital signs reviewed and stable Respiratory status: spontaneous breathing, nonlabored ventilation, respiratory function stable and patient connected to nasal cannula oxygen Cardiovascular status: blood pressure returned to baseline and stable Postop Assessment: no apparent nausea or vomiting Anesthetic complications: no    Last Vitals:  Vitals:   11/14/18 1245 11/14/18 1301  BP: (!) 157/77 (!) 130/107  Pulse: 100 96  Resp: 16 16  Temp: (!) 36.1 C 36.7 C  SpO2: 97% 96%    Last Pain:  Vitals:   11/14/18 1424  TempSrc:   PainSc: 7                  Ryan P Ellender

## 2018-11-14 NOTE — Op Note (Signed)
LAPAROSCOPIC CHOLECYSTECTOMY WITH INTRAOPERATIVE CHOLANGIOGRAM  Procedure Note  Christian Dalton 11/14/2018   Pre-op Diagnosis: chronic cholecystitis with gallstones     Post-op Diagnosis: chronic cholecystitis with gallstones  Procedure(s): LAPAROSCOPIC CHOLECYSTECTOMY WITH INTRAOPERATIVE CHOLANGIOGRAM  Surgeon(s): Coralie Keens, MD Judeth Horn, MD  Anesthesia: General  Staff:  Circulator: Lenora Boys, RN Scrub Person: Thornell Sartorius; Satterfield, Shirl A, RN  Estimated Blood Loss: Minimal               Specimens: sent to path  Procedure: The patient was brought to the operating room and identified as correct patient.  He is placed upon the operating table and general anesthesia was induced.  His abdomen was then prepped and draped in the usual sterile fashion.  I made a vertical incision several inches above the umbilicus with a scalpel.  I took this down to the fascia which was then opened scalpel.  A hemostat was then used to pass into the peritoneal cavity under direct vision.  A 0 Vicryl pursestring suture was placed around the fascial opening.  The [facility] port was placed to the opening and insufflation of the abdomen was begun.  I placed a 5 mm trocar in the patient's epigastrium and 2 more the right upper quadrant all under direct vision.  I could identify the percutaneous cholangiocatheter coming through the abdominal wall going into the liver.  Multiple adhesions were taken down around this bluntly.  Omentum was then bluntly removed off of the gallbladder.  The gallbladder was chronically inflamed and quite hard.  It was very difficult to grasp and elevate.  I was finally able to get elevated off start dissecting out the base.  The base was very hard as well.  I was finally able to dissect out the cystic duct which was quite foreshortened.  I was able achieve a critical end around it.  We had opened up the gallbladder slightly above this and visualized the percutaneous  cholecystostomy tube.  We then cut the sutures of the tube at the skin level and removed the percutaneous cholecystostomy tube.  I then identified the cystic artery and clipped proximally as well.  I then transected with a loop with the cautery.  The back wall the gallbladder was fused to the gallbladder fossa.  At this point I excised the gallbladder with the cautery leaving the back wall the gallbladder in place.  Once the gallbladder was removed is placed in an Endosac removed the incision at the umbilicus.  Hemostasis appeared to be achieved and I cauterized the mucosa of the visible wall the gallbladder posteriorly.  At this point, I copiously irrigated the abdomen saline.  Hemostasis to be achieved.  I then placed a piece of surgical snow the gallbladder fossa.  At this point we then placed a 19 [place] Blake drain under direct vision through the epigastric trocar and pulled out the most lateral right upper quadrant trocar.  This was placed into the gallbladder fossa and sewn in place with a nylon suture.  All ports were removed under direct vision the abdomen was deflated.  The 0 Vicryl at the umbilicus was tied off closing the fascial defect.  All incisions were injected with Marcaine and then closed with 4-0 Monocryl sutures.  Dermabond was then applied.  The patient tolerated the procedure well.  All the counts were correct at the end of the procedure.  The patient was then extubated in the operating room and taken in a stable condition to the recovery room.  Coralie Keens   Date: 11/14/2018  Time: 11:35 AM

## 2018-11-14 NOTE — Progress Notes (Addendum)
  [facility] Progress Note   Subjective:  Seen in room. S/p laparoscopic cholecystectomy this morning. No CP/dyspnea.  Objective Vitals:   11/14/18 1241 11/14/18 1242 11/14/18 1245 11/14/18 1301  BP:  (!) 157/77 (!) 157/77 (!) 130/107  Pulse: (!) 101  100 96  Resp: 19  16 16   Temp:   (!) 97 F (36.1 C) 98.1 F (36.7 C)  TempSrc:    Oral  SpO2: 97%  97% 96%  Weight:      Height:       Physical Exam General: Obese man, NAD. Nasal oxygen in place. Heart: RRR; no murmur Lungs: CTA anteriorly Abdomen: soft, mild tenderness. JP drain in place with serosanguinous drainage. Extremities: No LE edema Dialysis Access: LUE AVF + thrill   Additional Objective Labs: Basic Metabolic Panel: Recent Labs  Lab 11/12/18 0430 11/13/18 0425 11/14/18 0348  NA 138 137 137  K 4.2 3.9 3.7  CL 96* 97* 96*  CO2 28 24 25   GLUCOSE 114* 94 123*  BUN 23 33* 28*  CREATININE 6.39* 8.02* 7.31*  CALCIUM 9.0 8.8* 8.8*   Liver Function Tests: Recent Labs  Lab 11/12/18 0430 11/13/18 0425 11/14/18 0348  AST 170* 80* 36  ALT 173* 122* 83*  ALKPHOS 426* 373* 354*  BILITOT 4.0* 2.5* 1.2  PROT 6.2* 6.1* 6.0*  ALBUMIN 2.8* 2.6* 2.6*   Recent Labs  Lab 11/12/18 1021  LIPASE 19   CBC: Recent Labs  Lab 11/11/18 2310 11/12/18 0430 11/13/18 0425 11/14/18 0348  WBC 15.4* 10.9* 7.2 7.1  HGB 11.7* 12.2* 11.5* 11.3*  HCT 38.0* 41.2 37.7* 37.2*  MCV 92.2 93.0 93.8 92.1  PLT 290 263 288 324   CBG: Recent Labs  Lab 11/13/18 1111 11/13/18 1620 11/13/18 2113 11/14/18 0735 11/14/18 1205  GLUCAP 78 116* 99 96 150*   Medications: . meropenem (MERREM) IV 500 mg (11/14/18 0052)   . atorvastatin  20 mg Oral QPM  . Chlorhexidine Gluconate Cloth  6 each Topical Q0600  . gabapentin  300 mg Oral QHS  . [START ON 11/15/2018] heparin  5,000 Units Subcutaneous Q8H  . insulin aspart  0-5 Units Subcutaneous QHS  . insulin aspart  0-9 Units Subcutaneous TID WC  . insulin detemir  5  Units Subcutaneous BID  . lactulose  10 g Oral QAC supper  . levothyroxine  125 mcg Oral Q0600  . linaclotide  290 mcg Oral QPC supper  . mupirocin ointment  1 application Nasal BID  . ondansetron      . polyethylene glycol  17 g Oral Daily    Dialysis Orders: TTS at [facility] 4hr, 500/800, EDW 118kg, 2K/2.25Ca, AVF, heparin 5000 + 2500 mid-run bolus - Mircera 14mcg IV q 4 weeks - Calcitriol 60mcg PO q HD  Assessment/Plan: 1. Acute cholangitis: + elevated LFTs -> trending down. S/p cholecystectomy 12/22. 2. ESRD:Will continue HD per usual usual schedule. ** HOLIDAY HD SCHEDULE: USUAL TTS = MTS, NEXT SCHEDULED HD Monday** 3. Hypertension/volume:BP decent. Will try to get back to EDW. 4. Anemia:Hgb 11.3. No ESA needed for now. Monitor. 5. Metabolic bone disease:Ca ok, Phos pending. Continue home meds once cleared to eat. 6. CAD 7. Type 2 DM 8. A-fib  [name], PA-C 11/14/2018, 1:46 PM  [facility] Pager: [phone]  Pt seen, examined and agree w A/P as above.  [name] MD [employer] pager [phone]   11/14/2018, 7:25 PM

## 2018-11-14 NOTE — Progress Notes (Signed)
PROGRESS NOTE    Zale Marcotte  OVF:[id number] DOB: 07-12-1941 DOA: 11/11/2018 PCP: Venetia Maxon, Sharon Mt, MD     Brief Narrative:  Christian Dalton is a 77 year old male with history of ESRD on HD MWF, CAD, hypothyroidism, coronary artery disease, type 2 diabetes mellitus, hypertension, OSA, COPD who was admitted to the hospital with abdominal pain.  He was recently hospitalized and discharged in September 2019 when he was diagnosed with acute cholecystitis.  He was deemed high risk for surgery and he underwent cholecystostomy tube placement by IR with plans for outpatient follow-up with surgery.  He went to [facility] the day prior to admission with fever, chills nausea and vomiting.  He was having worsening abdominal pain.  His LFTs were elevated and he was transferred here.   New events last 24 hours / Subjective: Feeling well this morning, no complaints of fever, chest pain, shortness of breath, nausea, vomiting, abdominal pain. Plan for OR today.   Assessment & Plan:   Principal Problem:   Choledocholithiasis Active Problems:   ESRD (end stage renal disease) on dialysis (HCC)   DM2 (diabetes mellitus, type 2) (HCC)   HTN (hypertension)   CAD (coronary artery disease)   Obesity, Class III, BMI 40-49.9 (morbid obesity) ([place])   Choledocholithiasis -Appreciate GI, general surgery input -Cardiology evaluated for preop clearance -Planning for cholecystectomy tomorrow -Continue merrem   ESRD on dialysis -Nephrology following  Coronary artery disease -Appreciate cardiology for preop clearance  Essential hypertension -IV hydralazine prn   OSA -Continue CPAP qhs   Hypothyroidism -Continue Synthroid  Hyperlipidemia -Continue statin  DM type 2 with ESRD -Continue SSI, levemir     DVT prophylaxis: Heparin subq  Code Status: Full code Family Communication: No family at bedside Disposition Plan: Planning for OR today for lap cholecystectomy   Consultants:    GI  General surgery  Cardiology  Nephrology  Procedures:   None   Antimicrobials:  Anti-infectives (From admission, onward)   Start     Dose/Rate Route Frequency Ordered Stop   11/12/18 0030  meropenem (MERREM) 500 mg in sodium chloride 0.9 % 100 mL IVPB     500 mg 200 mL/hr over 30 Minutes Intravenous Every 24 hours 11/12/18 0027         Objective: Vitals:   11/13/18 1107 11/13/18 1631 11/13/18 2113 11/14/18 0513  BP: (!) 141/71 (!) 143/84 (!) 150/78 (!) 152/69  Pulse: 85 81 78 75  Resp: 18 18 19 17   Temp: 98.2 F (36.8 C) 98.1 F (36.7 C) 98.2 F (36.8 C) 98.4 F (36.9 C)  TempSrc: Oral Oral Oral Oral  SpO2: 94% 95% 94% 96%  Weight:   121.6 kg   Height:        Intake/Output Summary (Last 24 hours) at 11/14/2018 0821 Last data filed at 11/14/2018 0201 Gross per 24 hour  Intake 120 ml  Output 2180 ml  Net -2060 ml   Filed Weights   11/12/18 2103 11/13/18 0645 11/13/18 2113  Weight: 118.2 kg 121.6 kg 121.6 kg    Examination: General exam: Appears calm and comfortable  Respiratory system: Clear to auscultation. Respiratory effort normal. Cardiovascular system: S1 & S2 heard, RRR. No JVD, murmurs, rubs, gallops or clicks. No pedal edema. Gastrointestinal system: Abdomen is nondistended, soft and nontender. Obese. No organomegaly or masses felt. Normal bowel sounds heard. +Chole tube  Central nervous system: Alert and oriented. No focal neurological deficits. Extremities: Symmetric 5 x 5 power. Skin: No rashes, lesions or ulcers Psychiatry: Judgement  and insight appear normal. Mood & affect appropriate.    Data Reviewed: I have personally reviewed following labs and imaging studies  CBC: Recent Labs  Lab 11/11/18 2310 11/12/18 0430 11/13/18 0425 11/14/18 0348  WBC 15.4* 10.9* 7.2 7.1  HGB 11.7* 12.2* 11.5* 11.3*  HCT 38.0* 41.2 37.7* 37.2*  MCV 92.2 93.0 93.8 92.1  PLT 290 263 288 573   Basic Metabolic Panel: Recent Labs  Lab  11/11/18 2316 11/12/18 0430 11/13/18 0425 11/14/18 0348  NA 138 138 137 137  K 4.5 4.2 3.9 3.7  CL 97* 96* 97* 96*  CO2 26 28 24 25   GLUCOSE 156* 114* 94 123*  BUN 19 23 33* 28*  CREATININE 6.02* 6.39* 8.02* 7.31*  CALCIUM 8.7* 9.0 8.8* 8.8*   GFR: Estimated Creatinine Clearance: 11.4 mL/min (A) (by C-G formula based on SCr of 7.31 mg/dL (H)). Liver Function Tests: Recent Labs  Lab 11/12/18 0430 11/13/18 0425 11/14/18 0348  AST 170* 80* 36  ALT 173* 122* 83*  ALKPHOS 426* 373* 354*  BILITOT 4.0* 2.5* 1.2  PROT 6.2* 6.1* 6.0*  ALBUMIN 2.8* 2.6* 2.6*   Recent Labs  Lab 11/12/18 1021  LIPASE 19   No results for input(s): AMMONIA in the last 168 hours. Coagulation Profile: No results for input(s): INR, PROTIME in the last 168 hours. Cardiac Enzymes: No results for input(s): CKTOTAL, CKMB, CKMBINDEX, TROPONINI in the last 168 hours. BNP (last 3 results) No results for input(s): PROBNP in the last 8760 hours. HbA1C: No results for input(s): HGBA1C in the last 72 hours. CBG: Recent Labs  Lab 11/13/18 0621 11/13/18 1111 11/13/18 1620 11/13/18 2113 11/14/18 0735  GLUCAP 93 78 116* 99 96   Lipid Profile: No results for input(s): CHOL, HDL, LDLCALC, TRIG, CHOLHDL, LDLDIRECT in the last 72 hours. Thyroid Function Tests: No results for input(s): TSH, T4TOTAL, FREET4, T3FREE, THYROIDAB in the last 72 hours. Anemia Panel: No results for input(s): VITAMINB12, FOLATE, FERRITIN, TIBC, IRON, RETICCTPCT in the last 72 hours. Sepsis Labs: No results for input(s): PROCALCITON, LATICACIDVEN in the last 168 hours.  Recent Results (from the past 240 hour(s))  Surgical PCR screen     Status: None   Collection Time: 11/12/18  8:41 AM  Result Value Ref Range Status   MRSA, PCR NEGATIVE NEGATIVE Final   Staphylococcus aureus NEGATIVE NEGATIVE Final    Comment: (NOTE) The Xpert SA Assay (FDA approved for NASAL specimens in patients 53 years of age and older), is one component  of a comprehensive surveillance program. It is not intended to diagnose infection nor to guide or monitor treatment. Performed at [facility], [place] [address], [place], [place] [address]        Radiology Studies: No results found.    Scheduled Meds: . atorvastatin  20 mg Oral QPM  . Chlorhexidine Gluconate Cloth  6 each Topical Q0600  . gabapentin  300 mg Oral QHS  . heparin  5,000 Units Subcutaneous Q8H  . insulin aspart  0-5 Units Subcutaneous QHS  . insulin aspart  0-9 Units Subcutaneous TID WC  . insulin detemir  5 Units Subcutaneous BID  . lactulose  10 g Oral QAC supper  . levothyroxine  125 mcg Oral Q0600  . linaclotide  290 mcg Oral QPC supper  . mupirocin ointment  1 application Nasal BID  . polyethylene glycol  17 g Oral Daily   Continuous Infusions: . meropenem (MERREM) IV 500 mg (11/14/18 0052)     LOS:  3 days    Time spent: 20 minutes   Dessa Phi, DO [facility] [email or web address] Password [email or web address] 11/14/2018, 8:21 AM

## 2018-11-14 NOTE — Anesthesia Preprocedure Evaluation (Addendum)
Anesthesia Evaluation  Patient identified by MRN, date of birth, ID band Patient awake    Reviewed: Allergy & Precautions, NPO status , Patient's Chart, lab work & pertinent test results  Airway Mallampati: II  TM Distance: >3 FB Neck ROM: Full    Dental  (+) Upper Dentures   Pulmonary sleep apnea , COPD,    Pulmonary exam normal breath sounds clear to auscultation       Cardiovascular hypertension, + CAD  Normal cardiovascular exam+ dysrhythmias Atrial Fibrillation  Rhythm:Regular Rate:Normal  ECG: rate 100. Sinus rhythm with 1st degree A-V block Left axis deviation  Pre-op eval per cardiology    Neuro/Psych PSYCHIATRIC DISORDERS negative neurological ROS     GI/Hepatic negative GI ROS, Neg liver ROS,   Endo/Other  diabetes, Insulin DependentHypothyroidism   Renal/GU ESRF and DialysisRenal diseaseOn HD T, R, Sat     Musculoskeletal Spinal stenosis   Abdominal (+) + obese,   Peds  Hematology  (+) anemia , HLD   Anesthesia Other Findings cholecystitis  Reproductive/Obstetrics                            Anesthesia Physical Anesthesia Plan  ASA: IV  Anesthesia Plan: General   Post-op Pain Management:    Induction: Intravenous  PONV Risk Score and Plan: 3 and Ondansetron, Dexamethasone and Treatment may vary due to age or medical condition  Airway Management Planned: Oral ETT  Additional Equipment:   Intra-op Plan:   Post-operative Plan: Extubation in OR  Informed Consent: I have reviewed the patients History and Physical, chart, labs and discussed the procedure including the risks, benefits and alternatives for the proposed anesthesia with the patient or authorized representative who has indicated his/her understanding and acceptance.   Dental advisory given  Plan Discussed with: CRNA  Anesthesia Plan Comments:         Anesthesia Quick Evaluation

## 2018-11-14 NOTE — Transfer of Care (Signed)
Immediate Anesthesia Transfer of Care Note  Patient: Christian Dalton  Procedure(s) Performed: LAPAROSCOPIC CHOLECYSTECTOMY WITH INTRAOPERATIVE CHOLANGIOGRAM (N/A Abdomen)  Patient Location: PACU  Anesthesia Type:General  Level of Consciousness: awake, oriented, drowsy and patient cooperative  Airway & Oxygen Therapy: Patient Spontanous Breathing and Patient connected to nasal cannula oxygen  Post-op Assessment: Report given to RN and Post -op Vital signs reviewed and stable  Post vital signs: Reviewed and stable  Last Vitals:  Vitals Value Taken Time  BP 124/76 11/14/2018 12:03 PM  Temp    Pulse 107 11/14/2018 12:04 PM  Resp 21 11/14/2018 12:04 PM  SpO2 96 % 11/14/2018 12:04 PM  Vitals shown include unvalidated device data.  Last Pain:  Vitals:   11/14/18 1100  TempSrc:   PainSc: 0-No pain      Patients Stated Pain Goal: 0 (12/81/18 8677)  Complications: No apparent anesthesia complications

## 2018-11-14 NOTE — Progress Notes (Signed)
Patient ID: Christian Dalton, male   DOB: [birth year], 77 y.o.   MRN: [id number]   Pre Procedure note for inpatients:   Champion Corales has been scheduled for Procedure(s): LAPAROSCOPIC CHOLECYSTECTOMY WITH INTRAOPERATIVE CHOLANGIOGRAM (N/A) today. The various methods of treatment have been discussed with the patient. After consideration of the risks, benefits and treatment options the patient has consented to the planned procedure. I discussed the procedure in detail.    We discussed the risks  of a laparoscopic cholecystectomy and possible cholangiogram including, but not limited to bleeding, infection, injury to surrounding structures such as the intestine or liver, bile leak, retained gallstones, need to convert to an open procedure, prolonged diarrhea, blood clots such as  DVT, common bile duct injury, anesthesia risks, and possible need for additional procedures.    The patient has been seen and labs reviewed. There are no changes in the patient's condition to prevent proceeding with the planned procedure today.  Recent labs:  Lab Results  Component Value Date   WBC 7.1 11/14/2018   HGB 11.3 (L) 11/14/2018   HCT 37.2 (L) 11/14/2018   PLT 324 11/14/2018   GLUCOSE 123 (H) 11/14/2018   ALT 83 (H) 11/14/2018   AST 36 11/14/2018   NA 137 11/14/2018   K 3.7 11/14/2018   CL 96 (L) 11/14/2018   CREATININE 7.31 (H) 11/14/2018   BUN 28 (H) 11/14/2018   CO2 25 11/14/2018   INR 1.22 08/19/2018   HGBA1C 9.7 (H) 08/17/2018    Coralie Keens, MD 11/14/2018 8:28 AM

## 2018-11-14 NOTE — Anesthesia Procedure Notes (Signed)
Procedure Name: Intubation Date/Time: 11/14/2018 9:59 AM Performed by: Oletta Lamas, CRNA Pre-anesthesia Checklist: Patient identified, Emergency Drugs available, Suction available and Patient being monitored Patient Re-evaluated:Patient Re-evaluated prior to induction Oxygen Delivery Method: Circle System Utilized Preoxygenation: Pre-oxygenation with 100% oxygen Induction Type: IV induction Ventilation: Mask ventilation without difficulty and Oral airway inserted - appropriate to patient size Laryngoscope Size: Mac and 4 Grade View: Grade I Tube type: Oral Tube size: 7.5 mm Number of attempts: 2 Airway Equipment and Method: Stylet and Oral airway Placement Confirmation: ETT inserted through vocal cords under direct vision,  positive ETCO2 and breath sounds checked- equal and bilateral Secured at: 23 cm Tube secured with: Tape Dental Injury: Teeth and Oropharynx as per pre-operative assessment

## 2018-11-15 ENCOUNTER — Encounter (HOSPITAL_COMMUNITY): Payer: Self-pay | Admitting: Surgery

## 2018-11-15 LAB — CBC
HCT: 35.6 % — ABNORMAL LOW (ref 39.0–52.0)
Hemoglobin: 11.1 g/dL — ABNORMAL LOW (ref 13.0–17.0)
MCH: 28.5 pg (ref 26.0–34.0)
MCHC: 31.2 g/dL (ref 30.0–36.0)
MCV: 91.5 fL (ref 80.0–100.0)
Platelets: 308 10*3/uL (ref 150–400)
RBC: 3.89 MIL/uL — ABNORMAL LOW (ref 4.22–5.81)
RDW: 18 % — ABNORMAL HIGH (ref 11.5–15.5)
WBC: 11 10*3/uL — ABNORMAL HIGH (ref 4.0–10.5)
nRBC: 0 % (ref 0.0–0.2)

## 2018-11-15 LAB — GLUCOSE, CAPILLARY
Glucose-Capillary: 153 mg/dL — ABNORMAL HIGH (ref 70–99)
Glucose-Capillary: 137 mg/dL — ABNORMAL HIGH (ref 70–99)

## 2018-11-15 LAB — COMPREHENSIVE METABOLIC PANEL
ALT: 67 U/L — ABNORMAL HIGH (ref 0–44)
AST: 37 U/L (ref 15–41)
Albumin: 2.6 g/dL — ABNORMAL LOW (ref 3.5–5.0)
Alkaline Phosphatase: 287 U/L — ABNORMAL HIGH (ref 38–126)
Anion gap: 16 — ABNORMAL HIGH (ref 5–15)
BUN: 40 mg/dL — ABNORMAL HIGH (ref 8–23)
CO2: 23 mmol/L (ref 22–32)
Calcium: 8.5 mg/dL — ABNORMAL LOW (ref 8.9–10.3)
Chloride: 96 mmol/L — ABNORMAL LOW (ref 98–111)
Creatinine, Ser: 8.89 mg/dL — ABNORMAL HIGH (ref 0.61–1.24)
GFR calc Af Amer: 6 mL/min — ABNORMAL LOW (ref >60)
GFR calc non Af Amer: 5 mL/min — ABNORMAL LOW (ref >60)
Glucose, Bld: 216 mg/dL — ABNORMAL HIGH (ref 70–99)
Potassium: 4.7 mmol/L (ref 3.5–5.1)
Sodium: 135 mmol/L (ref 135–145)
Total Bilirubin: 0.9 mg/dL (ref 0.3–1.2)
Total Protein: 5.8 g/dL — ABNORMAL LOW (ref 6.5–8.1)

## 2018-11-15 MED ORDER — AMOXICILLIN-POT CLAVULANATE 875-125 MG PO TABS: 1.0000 | ORAL_TABLET | Freq: Two times a day (BID) | ORAL | Status: DC

## 2018-11-15 MED ORDER — AMOXICILLIN-POT CLAVULANATE 500-125 MG PO TABS: 500.0000 mg | ORAL_TABLET | Freq: Every day | ORAL | Status: DC

## 2018-11-15 MED ORDER — OXYCODONE-ACETAMINOPHEN 5-325 MG PO TABS: 1.0000 | ORAL_TABLET | Freq: Four times a day (QID) | ORAL | 0 refills | Status: AC | PRN

## 2018-11-15 MED ORDER — AMOXICILLIN-POT CLAVULANATE 500-125 MG PO TABS: 500.0000 mg | ORAL_TABLET | Freq: Every day | ORAL | 0 refills | Status: AC

## 2018-11-15 NOTE — Care Management Note (Signed)
Case Management Note Manya Silvas, RN MSN CCM Transitions of Care 20M [place] [phone]  Patient Details  Name: Christian Dalton MRN: [id number] Date of Birth: [birth year]  Subjective/Objective:         Choledocholithiasis s/p lat chole           Action/Plan: PTA home with wife. PT evaluation demonstrated no needs. Pt and spouse declined need for [facility] at this time. Pt to f/u with surgeon in 1 week. No transition of care needs identified at this time.   Expected Discharge Date:  11/15/18               Expected Discharge Plan:  Home/Self Care  In-House Referral:  NA  Discharge planning Services  CM Consult  Post Acute Care Choice:  NA Choice offered to:  NA  DME Arranged:  N/A DME Agency:  NA  HH Arranged:  NA HH Agency:  NA  Status of Service:  Completed, signed off  If discussed at [place] of Stay Meetings, dates discussed:    Additional Comments:  Bartholomew Crews, RN 11/15/2018, 1:39 PM

## 2018-11-15 NOTE — Discharge Instructions (Signed)
Surgical Drain Home Care °Surgical drains are used to remove extra fluid that normally builds up in a surgical wound after surgery. A surgical drain helps to heal a surgical wound. Different kinds of surgical drains include: °· Active drains. These drains use suction to pull drainage away from the surgical wound. Drainage flows through a tube to a container outside of the body. It is important to keep the bulb or the drainage container flat (compressed) at all times, except while you empty it. Flattening the bulb or container creates suction. The two most common types of active drains are bulb drains and Hemovac drains. °· Passive drains. These drains allow fluid to drain naturally, by gravity. Drainage flows through a tube to a bandage (dressing) or a container outside of the body. Passive drains do not need to be emptied. The most common type of passive drain is the Penrose drain. °A drain is placed during surgery. Immediately after surgery, drainage is usually bright red and a little thicker than water. The drainage may gradually turn yellow or pink and become thinner. It is likely that your health care provider will remove the drain when the drainage stops or when the amount decreases to 1-2 Tbsp (15-30 mL) during a 24-hour period. °How to care for your surgical drain °It is important to care for your drain to prevent infection. If your drain is placed at your back, or any other hard-to-reach area, ask another person to assist you in performing the following steps: °· Keep the skin around the drain dry and covered with a dressing at all times. °· Check your drain area every day for signs of infection. Check for: °? More redness, swelling, or pain. °? Pus or a bad smell. °? Cloudy drainage. °Follow instructions from your health care provider about how to take care of your drain and how to change your dressing. Change your dressing at least one time every day. Change it more often if needed to keep the dressing  dry. Make sure you: °1. Gather your supplies, including: °? Tape. °? Germ-free cleaning solution (sterile saline). °? Split gauze drain sponge: 4 x 4 inches (10 x 10 cm). °? Gauze square: 4 x 4 inches (10 x 10 cm). °2. Wash your hands with soap and water before you change your dressing. If soap and water are not available, use hand sanitizer. °3. Remove the old dressing. Avoid using scissors to do that. °4. Use sterile saline to clean your skin around the drain. °5. Place the tube through the slit in a drain sponge. Place the drain sponge so that it covers your wound. °6. Place the gauze square or another drain sponge on top of the drain sponge that is on the wound. Make sure the tube is between those layers. °7. Tape the dressing to your skin. °8. If you have an active bulb or Hemovac drain, tape the drainage tube to your skin 1-2 inches (2.5-5 cm) below the place where the tube enters your body. Taping keeps the tube from pulling on any stitches (sutures) that you have. °9. Wash your hands with soap and water. °10. Write down the color of your drainage and how often you change your dressing. °How to empty your active bulb or Hemovac drain ° °1. Make sure that you have a measuring cup that you can empty your drainage into. °2. Wash your hands with soap and water. If soap and water are not available, use hand sanitizer. °3. Gently move your fingers down the   tube while squeezing very lightly. This is called stripping the tube. This clears any drainage, clots, or tissue from the tube. ? Do not pull on the tube. ? You may need to strip the tube several times every day to keep the tube clear. 4. Open the bulb cap or the drain plug. Do not touch the inside of the cap or the bottom of the plug. 5. Empty all of the drainage into the measuring cup. 6. Compress the bulb or the container and replace the cap or the plug. To compress the bulb or the container, squeeze it firmly in the middle while you close the cap or plug  the container. 7. Write down the amount of drainage that you have in each 24-hour period. If you have less than 2 Tbsp (30 mL) of drainage during 24 hours, contact your health care provider. 8. Flush the drainage down the toilet. 9. Wash your hands with soap and water. Contact a health care provider if:  You have more redness, swelling, or pain around your drain area.  The amount of drainage that you have is increasing instead of decreasing.  You have pus or a bad smell coming from your drain area.  You have a fever.  You have drainage that is cloudy.  There is a sudden stop or a sudden decrease in the amount of drainage that you have.  Your tube falls out.  Your active draindoes not stay compressedafter you empty it. Summary  Surgical drains are used to remove extra fluid that normally builds up in a surgical wound after surgery.  Different kinds of surgical drains include active drains and passive drains. Active drains use suction to pull drainage away from the surgical wound, and passive drains allow fluid to drain naturally.  It is important to care for your drain to prevent infection. If your drain is placed at your back, or any other hard-to-reach area, ask another person to assist you.  Contact your health care provider if you have more redness, swelling, or pain around your drain area. This information is not intended to replace advice given to you by your health care provider. Make sure you discuss any questions you have with your health care provider. Document Released: 11/07/2000 Document Revised: 12/03/2017 Document Reviewed: 05/30/2015 Elsevier Interactive Patient Education  2019 [employer]  Please arrive at least 30 min before your appointment to complete your check in paperwork.  If you are unable to arrive 30 min prior to your appointment time we may have to cancel or reschedule you. LAPAROSCOPIC SURGERY: POST OP  INSTRUCTIONS Always review your discharge instruction sheet given to you by the facility where your surgery was performed. IF YOU HAVE DISABILITY OR FAMILY LEAVE FORMS, YOU MUST BRING THEM TO THE OFFICE FOR PROCESSING.   DO NOT GIVE THEM TO YOUR DOCTOR.  PAIN CONTROL  1. First take acetaminophen (Tylenol) AND/or ibuprofen (Advil) to control your pain after surgery.  Follow directions on package.  Taking acetaminophen (Tylenol) and/or ibuprofen (Advil) regularly after surgery will help to control your pain and lower the amount of prescription pain medication you may need.  You should not take more than 4,000 mg (4 grams) of acetaminophen (Tylenol) in 24 hours.  You should not take ibuprofen (Advil), aleve, motrin, naprosyn or other NSAIDS if you have a history of stomach ulcers or chronic kidney disease.  2. A prescription for pain medication may be given to you upon discharge.  Take your pain medication as prescribed, if you still have uncontrolled pain after taking acetaminophen (Tylenol) or ibuprofen (Advil). 3. Use ice packs to help control pain. 4. If you need a refill on your pain medication, please contact your pharmacy.  They will contact our office to request authorization. Prescriptions will not be filled after 5pm or on week-ends.  HOME MEDICATIONS 5. Take your usually prescribed medications unless otherwise directed.  DIET 6. You should follow a light diet the first few days after arrival home.  Be sure to include lots of fluids daily. Avoid fatty, fried foods.   CONSTIPATION 7. It is common to experience some constipation after surgery and if you are taking pain medication.  Increasing fluid intake and taking a stool softener (such as Colace) will usually help or prevent this problem from occurring.  A mild laxative (Milk of Magnesia or Miralax) should be taken according to package instructions if there are no bowel movements after 48 hours.  WOUND/INCISION CARE 8. Most patients  will experience some swelling and bruising in the area of the incisions.  Ice packs will help.  Swelling and bruising can take several days to resolve.  9. Unless discharge instructions indicate otherwise, follow guidelines below  a. STERI-STRIPS - you may remove your outer bandages 48 hours after surgery, and you may shower at that time.  You have steri-strips (small skin tapes) in place directly over the incision.  These strips should be left on the skin for 7-10 days.   b. DERMABOND/SKIN GLUE - you may shower in 24 hours.  The glue will flake off over the next 2-3 weeks. 10. Any sutures or staples will be removed at the office during your follow-up visit.  ACTIVITIES 11. You may resume regular (light) daily activities beginning the next day--such as daily self-care, walking, climbing stairs--gradually increasing activities as tolerated.  You may have sexual intercourse when it is comfortable.  Refrain from any heavy lifting or straining until approved by your doctor. a. You may drive when you are no longer taking prescription pain medication, you can comfortably wear a seatbelt, and you can safely maneuver your car and apply brakes.  FOLLOW-UP 12. You should see your doctor in the office for a follow-up appointment approximately 2-3 weeks after your surgery.  You should have been given your post-op/follow-up appointment when your surgery was scheduled.  If you did not receive a post-op/follow-up appointment, make sure that you call for this appointment within a day or two after you arrive home to insure a convenient appointment time.  OTHER INSTRUCTIONS  WHEN TO CALL YOUR DOCTOR: 1. Fever over 101.0 2. Inability to urinate 3. Continued bleeding from incision. 4. Increased pain, redness, or drainage from the incision. 5. Increasing abdominal pain  The clinic staff is available to answer your questions during regular business hours.  Please dont hesitate to call and ask to speak to one of the  nurses for clinical concerns.  If you have a medical emergency, go to the nearest emergency room or call 911.  A surgeon from [facility] is always on call at the hospital. [address], [place], [place], [place]  [address] ? [address] [place], [place], [place]   [address] [phone] ? [phone] ? FAX [phone]

## 2018-11-15 NOTE — Progress Notes (Signed)
[facility] Progress Note  1 Day Post-Op  Subjective: CC-  Patient currently in dialysis. Overall doing well. Asking if he can go home today. States that he has very little abdominal pain. Tolerating diet. Denies n/v. Passing flatus. BM day before yesterday. PT consult pending.  States that he lives at home with his wife.  Objective: Vital signs in last 24 hours: Temp:  [97 F (36.1 C)-98.6 F (37 C)] 98 F (36.7 C) (12/23 0740) Pulse Rate:  [77-106] 77 (12/23 0746) Resp:  [10-21] 16 (12/23 0746) BP: (106-157)/(61-107) 129/73 (12/23 0746) SpO2:  [92 %-97 %] 96 % (12/23 0740) Weight:  [121 kg-121.6 kg] 121 kg (12/23 0740) Last BM Date: 11/13/18  Intake/Output from previous day: 12/22 0701 - 12/23 0700 In: 860 [P.O.:360; I.V.:500] Out: 140 [Drains:115; Blood:25] Intake/Output this shift: No intake/output data recorded.  PE: Gen:  Alert, NAD, pleasant HEENT: EOM's intact, pupils equal and round Pulm:  effort normal Abd: Soft, protuberant, +BS, lap incisions cdi, JP drain output serosanguinous  Lab Results:  Recent Labs    11/14/18 0348 11/15/18 0500  WBC 7.1 11.0*  HGB 11.3* 11.1*  HCT 37.2* 35.6*  PLT 324 308   BMET Recent Labs    11/14/18 0348 11/15/18 0500  NA 137 135  K 3.7 4.7  CL 96* 96*  CO2 25 23  GLUCOSE 123* 216*  BUN 28* 40*  CREATININE 7.31* 8.89*  CALCIUM 8.8* 8.5*   PT/INR No results for input(s): LABPROT, INR in the last 72 hours. CMP     Component Value Date/Time   NA 135 11/15/2018 0500   K 4.7 11/15/2018 0500   CL 96 (L) 11/15/2018 0500   CO2 23 11/15/2018 0500   GLUCOSE 216 (H) 11/15/2018 0500   BUN 40 (H) 11/15/2018 0500   CREATININE 8.89 (H) 11/15/2018 0500   CALCIUM 8.5 (L) 11/15/2018 0500   PROT 5.8 (L) 11/15/2018 0500   ALBUMIN 2.6 (L) 11/15/2018 0500   AST 37 11/15/2018 0500   ALT 67 (H) 11/15/2018 0500   ALKPHOS 287 (H) 11/15/2018 0500   BILITOT 0.9 11/15/2018 0500   GFRNONAA 5 (L) 11/15/2018 0500   GFRAA 6 (L) 11/15/2018 0500   Lipase     Component Value Date/Time   LIPASE 19 11/12/2018 1021       Studies/Results: No results found.  Anti-infectives: Anti-infectives (From admission, onward)   Start     Dose/Rate Route Frequency Ordered Stop   11/15/18 1000  amoxicillin-clavulanate (AUGMENTIN) 875-125 MG per tablet 1 tablet     1 tablet Oral Every 12 hours 11/15/18 0822 11/20/18 0959   11/12/18 0030  meropenem (MERREM) 500 mg in sodium chloride 0.9 % 100 mL IVPB  Status:  Discontinued     500 mg 200 mL/hr over 30 Minutes Intravenous Every 24 hours 11/12/18 0027 11/15/18 0160       Assessment/Plan ESRD HTN CAD DM2 A-fib  Chronic cholecystitis with gallstones S/p laparoscopic cholecystectomy 12/22 Dr. Ninfa Linden - POD 1 - JP drain output serosanguinous, 75cc/24hr  ID - merrem 12/20>>12/23, augmentin 12/23>> (day#1/5) FEN - Renal/CM diet VTE - SCDs, sq heparin Foley - none  Plan - Patient is tolerating diet and pain controlled on oral meds. If he ambulates/works with PT he is stable for discharge later today from surgical standpoint. He needs to complete 5 days of augmentin postop. He will go home with the JP drain and follow up in our office in about 1 week. Discharge instructions on AVS.  LOS: 4 days    Wellington Hampshire , [facility] 11/15/2018, 8:22 AM Pager: [phone] Mon 7:00 am -11:30 AM Tues-Fri 7:00 am-4:30 pm Sat-Sun 7:00 am-11:30 am

## 2018-11-15 NOTE — Progress Notes (Signed)
Patient Discharge: Disposition: Patient discharged to home. Education: Reviewed the medications, prescriptions, follow-up appointments, and discharge instructions.   IV: Discontinue IV before discharge. Telemetry: N/A Transportation:  Patient escorted out of the unit in w/c. Belongings: Patient took all his belongings with him.

## 2018-11-15 NOTE — Evaluation (Signed)
Physical Therapy Evaluation & Discharge Patient Details Name: Otilio Groleau MRN: [id number] DOB: 10/01/1941 Today's Date: 11/15/2018   History of Present Illness  Pt is a 77 y.o. male admitted 11/11/18 with abdominal pain. S/p lap cholecystectomy on 12/22. PMH includes recent cholecystostomy tube placement (07/2018), ESRD (on HD MWF), CAD, DM2, HTN, COPD, OSA.    Clinical Impression  Patient evaluated by Physical Therapy with no further acute PT needs identified. PTA, pt ambulatory with SPC/RW, wife assists with ADLs. Today, pt ambulatory with RW at supervision-level. All education has been completed and the patient has no further questions. Family will continue to provide assist at home; owns all necessary DME. Acute PT is signing off. Thank you for this referral.    Follow Up Recommendations No PT follow up;Supervision for mobility/OOB    Equipment Recommendations  None recommended by PT    Recommendations for Other Services       Precautions / Restrictions Precautions Precautions: Fall Precaution Comments: R abdominal JP drain Restrictions Weight Bearing Restrictions: No      Mobility  Bed Mobility Overal bed mobility: Independent                Transfers Overall transfer level: Independent                  Ambulation/Gait Ambulation/Gait assistance: Supervision Gait Distance (Feet): 100 Feet Assistive device: Rolling walker (2 wheeled) Gait Pattern/deviations: Step-through pattern;Decreased stride length Gait velocity: Decreased Gait velocity interpretation: 1.31 - 2.62 ft/sec, indicative of limited community ambulator General Gait Details: Slow, steady amb with RW; supervision for safety  Stairs            Wheelchair Mobility    Modified Rankin (Stroke Patients Only)       Balance Overall balance assessment: Needs assistance   Sitting balance-Leahy Scale: Good       Standing balance-Leahy Scale: Fair                                Pertinent Vitals/Pain Pain Assessment: No/denies pain    Home Living Family/patient expects to be discharged to:: Private residence Living Arrangements: Spouse/significant other Available Help at Discharge: Family;Available 24 hours/day Type of Home: Apartment Home Access: Level entry     Home Layout: One level Home Equipment: Walker - 2 wheels;Shower seat;Bedside commode;Cane - single point Additional Comments: Lives with wife. Son lives in apartment upstairs    Prior Function Level of Independence: Needs assistance   Gait / Transfers Assistance Needed: Mod indep with RW or SPC; typically uses w/c for transport home after HD secondary to fatigue  ADL's / Homemaking Assistance Needed: Wife assists with dressing. Provides supervision for safety        Hand Dominance        Extremity/Trunk Assessment   Upper Extremity Assessment Upper Extremity Assessment: Overall WFL for tasks assessed    Lower Extremity Assessment Lower Extremity Assessment: Overall WFL for tasks assessed       Communication   Communication: No difficulties  Cognition Arousal/Alertness: Awake/alert Behavior During Therapy: WFL for tasks assessed/performed Overall Cognitive Status: Within Functional Limits for tasks assessed                                 General Comments: HOH. WFL for simple tasks      General Comments General comments (skin integrity, edema, etc.): Wife  and son present. Wife feels comfortable securing JP drain (had to do so after last d/c home in September)    Exercises     Assessment/Plan    PT Assessment Patent does not need any further PT services  PT Problem List         PT Treatment Interventions      PT Goals (Current goals can be found in the Care Plan section)  Acute Rehab PT Goals PT Goal Formulation: All assessment and education complete, DC therapy    Frequency     Barriers to discharge        Co-evaluation                AM-PAC PT "6 Clicks" Mobility  Outcome Measure Help needed turning from your back to your side while in a flat bed without using bedrails?: None Help needed moving from lying on your back to sitting on the side of a flat bed without using bedrails?: None Help needed moving to and from a bed to a chair (including a wheelchair)?: None Help needed standing up from a chair using your arms (e.g., wheelchair or bedside chair)?: A Little Help needed to walk in hospital room?: A Little Help needed climbing 3-5 steps with a railing? : A Little 6 Click Score: 21    End of Session   Activity Tolerance: Patient tolerated treatment well Patient left: in bed;with call bell/phone within reach;with family/visitor present Nurse Communication: Mobility status PT Visit Diagnosis: Other abnormalities of gait and mobility (R26.89)    Time: 2841-3244 PT Time Calculation (min) (ACUTE ONLY): 12 min   Charges:   PT Evaluation $PT Eval Low Complexity: 1 Low        Mabeline Caras, PT, DPT Acute Rehabilitation Services  Pager [phone] Office [place] 11/15/2018, 1:09 PM

## 2018-11-15 NOTE — Procedures (Signed)
I was present at this dialysis session. I have reviewed the session itself and made appropriate changes.   Vital signs in last 24 hours:  Temp:  [97 F (36.1 C)-98.6 F (37 C)] 98 F (36.7 C) (12/23 0740) Pulse Rate:  [75-106] 81 (12/23 0830) Resp:  [10-21] 16 (12/23 0830) BP: (106-157)/(61-107) 116/66 (12/23 0830) SpO2:  [92 %-97 %] 96 % (12/23 0740) Weight:  [121 kg-121.6 kg] 121 kg (12/23 0740) Weight change: 0 kg Filed Weights   11/13/18 2113 11/14/18 2117 11/15/18 0740  Weight: 121.6 kg 121.6 kg 121 kg    Recent Labs  Lab 11/15/18 0500  NA 135  K 4.7  CL 96*  CO2 23  GLUCOSE 216*  BUN 40*  CREATININE 8.89*  CALCIUM 8.5*    Recent Labs  Lab 11/13/18 0425 11/14/18 0348 11/15/18 0500  WBC 7.2 7.1 11.0*  HGB 11.5* 11.3* 11.1*  HCT 37.7* 37.2* 35.6*  MCV 93.8 92.1 91.5  PLT 288 324 308    Scheduled Meds: . amoxicillin-clavulanate  500 mg Oral QHS  . atorvastatin  20 mg Oral QPM  . Chlorhexidine Gluconate Cloth  6 each Topical Q0600  . gabapentin  300 mg Oral QHS  . heparin  5,000 Units Subcutaneous Q8H  . insulin aspart  0-5 Units Subcutaneous QHS  . insulin aspart  0-9 Units Subcutaneous TID WC  . insulin detemir  5 Units Subcutaneous BID  . lactulose  10 g Oral QAC supper  . levothyroxine  125 mcg Oral Q0600  . linaclotide  290 mcg Oral QPC supper  . mupirocin ointment  1 application Nasal BID  . polyethylene glycol  17 g Oral Daily   Continuous Infusions: PRN Meds:.hydrALAZINE, ondansetron **OR** ondansetron (ZOFRAN) IV, oxyCODONE-acetaminophen   Donetta Potts,  MD 11/15/2018, 8:35 AM

## 2018-11-15 NOTE — Progress Notes (Signed)
PHARMACY NOTE:  ANTIMICROBIAL RENAL DOSAGE ADJUSTMENT  Current antimicrobial regimen includes a mismatch between antimicrobial dosage and estimated renal function.  As per policy approved by the Pharmacy & Therapeutics and Medical Executive Committees, the antimicrobial dosage will be adjusted accordingly.  Current antimicrobial dosage:  Augmentin 875 mg every 12 hours  Indication: Intra-abd infection s/p lap chole 12/22  Renal Function:  Estimated Creatinine Clearance: 9.4 mL/min (A) (by C-G formula based on SCr of 8.89 mg/dL (H)). [x]      On intermittent HD, scheduled: []      On CRRT    Antimicrobial dosage has been changed to:  Augmentin 500 mg daily at bedtime   Additional comments: Will keep stop date per surgery as 12/27   Thank you for allowing pharmacy to be a part of this patient's care.  Alycia Rossetti, PharmD, BCPS Clinical Pharmacist Please check AMION for all [place] numbers 11/15/2018 8:29 AM

## 2018-11-15 NOTE — Progress Notes (Signed)
PT Cancellation Note  Patient Details Name: Dow Blahnik MRN: [id number] DOB: [birth year]   Cancelled Treatment:    Reason Eval/Treat Not Completed: Patient at procedure or test/unavailable (HD). Will follow-up for PT evaluation as schedule permits.  Mabeline Caras, PT, DPT Acute Rehabilitation Services  Pager [phone] Office [phone]  Derry Lory 11/15/2018, 8:23 AM

## 2018-11-15 NOTE — Discharge Summary (Signed)
Physician Discharge Summary  [name] YKD:[id number] DOB: Mar 05, 1941 DOA: 11/11/2018  PCP: Emmaline Kluver, MD  Admit date: 11/11/2018 Discharge date: 11/15/2018  Admitted From: Home Disposition:  Home  Recommendations for Outpatient Follow-up:  1. Follow up with [facility] as scheduled   Home Health: PT RN  Equipment/Devices: None   Discharge Condition: Stable CODE STATUS: Full  Diet recommendation: Heart healthy   Brief/Interim Summary: Christian Dalton is a 77 year old male with history of ESRD on HD MWF, CAD, hypothyroidism, coronary artery disease, type 2 diabetes mellitus, hypertension, OSA, COPD who was admitted to the hospital with abdominal pain. He was recently hospitalized and discharged in September 2019 when he was diagnosed with acute cholecystitis. He was deemed high risk for surgery and he underwent cholecystostomy tube placement by IR with plans for outpatient follow-up with surgery. He went to [facility] the day prior to admission with fever, chills nausea and vomiting. He was having worsening abdominal pain. His LFTs were elevated and he was transferred here. He was evaluated by GI and General surgery, ultimately underwent lap cholecystectomy on 12/22.   Discharge Diagnoses:  Principal Problem:   Choledocholithiasis Active Problems:   ESRD (end stage renal disease) on dialysis (HCC)   DM2 (diabetes mellitus, type 2) (HCC)   HTN (hypertension)   CAD (coronary artery disease)   Obesity, Class III, BMI 40-49.9 (morbid obesity) ([place])   Choledocholithiasis -Appreciate GI, general surgery input -Cardiology evaluated for preop clearance -S/p lap cholecystectomy 12/22 with Dr. Ninfa Linden  -Continue augmentin 5 days -Leave in JP drain until follow up   ESRD on dialysis -Nephrology following  Coronary artery disease -Appreciate cardiology for preop clearance  OSA -Continue CPAP qhs   Hypothyroidism -Continue  Synthroid  Hyperlipidemia -Continue statin  DM type 2 with ESRD -Continue SSI, levemir   Discharge Instructions  Discharge Instructions    Call MD for:  difficulty breathing, headache or visual disturbances   Complete by:  As directed    Call MD for:  extreme fatigue   Complete by:  As directed    Call MD for:  hives   Complete by:  As directed    Call MD for:  persistant dizziness or light-headedness   Complete by:  As directed    Call MD for:  persistant nausea and vomiting   Complete by:  As directed    Call MD for:  redness, tenderness, or signs of infection (pain, swelling, redness, odor or green/yellow discharge around incision site)   Complete by:  As directed    Call MD for:  severe uncontrolled pain   Complete by:  As directed    Call MD for:  temperature >100.4   Complete by:  As directed    Increase activity slowly   Complete by:  As directed      Allergies as of 11/15/2018      Reactions   Avelox [moxifloxacin Hcl] Other (See Comments)   unknown   Shellfish Allergy Nausea And Vomiting   Stomach   Sulfa Antibiotics Other (See Comments)   unknown   Tetracyclines & Related Other (See Comments)   unknown      Medication List    STOP taking these medications   HYDROcodone-acetaminophen 10-325 MG tablet Commonly known as:  NORCO     TAKE these medications   amoxicillin-clavulanate 500-125 MG tablet Commonly known as:  AUGMENTIN Take 1 tablet (500 mg total) by mouth at bedtime for 5 days.   aspirin EC 81 MG tablet  Take 81 mg by mouth every other day.   atorvastatin 20 MG tablet Commonly known as:  LIPITOR Take 1 tablet (20 mg total) by mouth every evening. What changed:  how much to take   buPROPion 150 MG 24 hr tablet Commonly known as:  WELLBUTRIN XL Take 150 mg by mouth at bedtime.   docusate sodium 100 MG capsule Commonly known as:  COLACE Take 100 mg by mouth 2 (two) times daily.   escitalopram 10 MG tablet Commonly known as:   LEXAPRO Take 10 mg every evening by mouth.   gabapentin 300 MG capsule Commonly known as:  NEURONTIN Take 300 mg by mouth at bedtime.   HUMALOG MIX 75/25 KWIKPEN (75-25) 100 UNIT/ML Kwikpen Generic drug:  Insulin Lispro Prot & Lispro Take 10 Units by mouth 2 (two) times daily.   insulin detemir 100 UNIT/ML injection Commonly known as:  LEVEMIR Inject 0.14 mLs (14 Units total) into the skin 2 (two) times daily. What changed:    how much to take  when to take this  additional instructions   lactulose (encephalopathy) 10 GM/15ML Soln Commonly known as:  CHRONULAC Take 15 mLs by mouth daily before supper.   levothyroxine 125 MCG tablet Commonly known as:  SYNTHROID, LEVOTHROID Take 125 mcg daily by mouth.   lidocaine-prilocaine cream Commonly known as:  EMLA Apply 1 application topically See admin instructions. To access site (AVF) 1-2 hours before dialysis. Cover with occlusive dressing (Saran warp)   linaclotide 290 MCG Caps capsule Commonly known as:  LINZESS Take 290 mcg by mouth daily after supper.   loratadine 10 MG tablet Commonly known as:  CLARITIN Take 10 mg every evening by mouth.   midodrine 2.5 MG tablet Commonly known as:  PROAMATINE Take 2.5 mg by mouth 3 (three) times daily.   montelukast 10 MG tablet Commonly known as:  SINGULAIR Take 10 mg every evening by mouth.   oxyCODONE-acetaminophen 5-325 MG tablet Commonly known as:  PERCOCET/ROXICET Take 1-2 tablets by mouth every 6 (six) hours as needed for up to 3 days for severe pain.   sevelamer carbonate 800 MG tablet Commonly known as:  RENVELA Take 3 tablets (2,400 mg total) by mouth 3 (three) times daily with meals. What changed:  when to take this      [facility]. Go on 11/22/2018.   Specialty:  General Surgery Why:  Your appointment is 11/22/18 at 4pm for drain check. Please arrive 15 minutes early to check in. Contact information: [address] [place] [phone]       Coralie Keens, MD. Go on 12/07/2018.   Specialty:  General Surgery Why:  Your appointment is 12/07/18 at 9:30am. Please arrive 15 minutes early to check in. Contact information: [address] [place] [address] [phone]          Allergies  Allergen Reactions  . Avelox [Moxifloxacin Hcl] Other (See Comments)    unknown  . Shellfish Allergy Nausea And Vomiting    Stomach  . Sulfa Antibiotics Other (See Comments)    unknown  . Tetracyclines & Related Other (See Comments)    unknown    Consultations:  GI   General surgery   Cardiology   Nephrology    Procedures/Studies: Mr Abdomen Mrcp Wo Contrast  Result Date: 11/08/2018 CLINICAL DATA:  Right abdominal pain, status post cholecystostomy tube removal on 08/19/2018 EXAM: MRI ABDOMEN WITHOUT CONTRAST  (  INCLUDING MRCP) TECHNIQUE: Multiplanar multisequence MR imaging of the abdomen was performed. Heavily T2-weighted images of the biliary and pancreatic ducts were obtained, and three-dimensional MRCP images were rendered by post processing. COMPARISON:  MRI abdomen dated 08/18/2018 FINDINGS: Lower chest: Lung bases are clear. Hepatobiliary: Mild hepatic steatosis. Mildly nodular hepatic contour. No focal hepatic lesion is seen. Cholelithiasis. No gallbladder wall thickening or pericholecystic fluid. Prior cholecystostomy tube has been removed. No intrahepatic or extrahepatic ductal dilatation. Common duct measures 5 mm. No choledocholithiasis is seen. Pancreas: Subcentimeter peripancreatic cystic lesions, likely reflecting small side branch IPMNs. No pancreatic ductal dilatation or atrophy. Spleen:  Within normal limits. Adrenals/Urinary Tract: Stable 13 mm left adrenal nodule, favored to reflect a benign adrenal adenoma. Innumerable bilateral renal cysts, some of which are mildly irregular, grossly unchanged from recent MR. No  hydronephrosis. Stomach/Bowel: Stomach and visualized bowel are grossly unremarkable. Vascular/Lymphatic:  No evidence of abdominal aortic aneurysm. No suspicious abdominal lymphadenopathy. Other:  No abdominal ascites. Musculoskeletal: No focal osseous lesions. IMPRESSION: Status post cholecystectomy tube removal. Cholelithiasis without associated findings to suggest acute cholecystitis. No intrahepatic or extrahepatic ductal dilatation. Common duct measures 5 mm. No choledocholithiasis is seen. Additional stable ancillary findings as above. Electronically Signed   By: Julian Hy M.D.   On: 11/08/2018 07:42     Discharge Exam: Vitals:   11/15/18 1046 11/15/18 1120  BP: 110/65 103/67  Pulse: 89 96  Resp: 16 20  Temp: 98 F (36.7 C) 98.2 F (36.8 C)  SpO2: 99% 92%    General: Pt is alert, awake, not in acute distress Cardiovascular: RRR, S1/S2 +, no rubs, no gallops Respiratory: CTA bilaterally, no wheezing, no rhonchi Abdominal: Soft, NT, ND, bowel sounds +, obese, JP drain in place  Extremities: no edema, no cyanosis    The results of significant diagnostics from this hospitalization (including imaging, microbiology, ancillary and laboratory) are listed below for reference.     Microbiology: Recent Results (from the past 240 hour(s))  Surgical PCR screen     Status: None   Collection Time: 11/12/18  8:41 AM  Result Value Ref Range Status   MRSA, PCR NEGATIVE NEGATIVE Final   Staphylococcus aureus NEGATIVE NEGATIVE Final    Comment: (NOTE) The Xpert SA Assay (FDA approved for NASAL specimens in patients 59 years of age and older), is one component of a comprehensive surveillance program. It is not intended to diagnose infection nor to guide or monitor treatment. Performed at [facility], [place] [address], [place], [place] [address]      Labs: BNP (last 3 results) No results for input(s): BNP in the last 8760 hours. Basic Metabolic Panel: Recent Labs  Lab  11/11/18 2316 11/12/18 0430 11/13/18 0425 11/14/18 0348 11/15/18 0500  NA 138 138 137 137 135  K 4.5 4.2 3.9 3.7 4.7  CL 97* 96* 97* 96* 96*  CO2 26 28 24 25 23   GLUCOSE 156* 114* 94 123* 216*  BUN 19 23 33* 28* 40*  CREATININE 6.02* 6.39* 8.02* 7.31* 8.89*  CALCIUM 8.7* 9.0 8.8* 8.8* 8.5*   Liver Function Tests: Recent Labs  Lab 11/12/18 0430 11/13/18 0425 11/14/18 0348 11/15/18 0500  AST 170* 80* 36 37  ALT 173* 122* 83* 67*  ALKPHOS 426* 373* 354* 287*  BILITOT 4.0* 2.5* 1.2 0.9  PROT 6.2* 6.1* 6.0* 5.8*  ALBUMIN 2.8* 2.6* 2.6* 2.6*   Recent Labs  Lab 11/12/18 1021  LIPASE 19   No results for input(s): AMMONIA in  the last 168 hours. CBC: Recent Labs  Lab 11/11/18 2310 11/12/18 0430 11/13/18 0425 11/14/18 0348 11/15/18 0500  WBC 15.4* 10.9* 7.2 7.1 11.0*  HGB 11.7* 12.2* 11.5* 11.3* 11.1*  HCT 38.0* 41.2 37.7* 37.2* 35.6*  MCV 92.2 93.0 93.8 92.1 91.5  PLT 290 263 288 324 308   Cardiac Enzymes: No results for input(s): CKTOTAL, CKMB, CKMBINDEX, TROPONINI in the last 168 hours. BNP: Invalid input(s): POCBNP CBG: Recent Labs  Lab 11/14/18 1205 11/14/18 1635 11/14/18 2118 11/15/18 0851 11/15/18 1118  GLUCAP 150* 157* 298* 153* 137*   D-Dimer No results for input(s): DDIMER in the last 72 hours. Hgb A1c No results for input(s): HGBA1C in the last 72 hours. Lipid Profile No results for input(s): CHOL, HDL, LDLCALC, TRIG, CHOLHDL, LDLDIRECT in the last 72 hours. Thyroid function studies No results for input(s): TSH, T4TOTAL, T3FREE, THYROIDAB in the last 72 hours.  Invalid input(s): FREET3 Anemia work up No results for input(s): VITAMINB12, FOLATE, FERRITIN, TIBC, IRON, RETICCTPCT in the last 72 hours. Urinalysis No results found for: COLORURINE, APPEARANCEUR, [place], [place], [place], [place], [place], [place], PROTEINUR, UROBILINOGEN, NITRITE, LEUKOCYTESUR Sepsis Labs Invalid input(s): PROCALCITONIN,  WBC,   LACTICIDVEN Microbiology Recent Results (from the past 240 hour(s))  Surgical PCR screen     Status: None   Collection Time: 11/12/18  8:41 AM  Result Value Ref Range Status   MRSA, PCR NEGATIVE NEGATIVE Final   Staphylococcus aureus NEGATIVE NEGATIVE Final    Comment: (NOTE) The Xpert SA Assay (FDA approved for NASAL specimens in patients 75 years of age and older), is one component of a comprehensive surveillance program. It is not intended to diagnose infection nor to guide or monitor treatment. Performed at [facility], [place] [address] [place], [place] [address]      Patient was seen and examined on the day of discharge and was found to be in stable condition. Time coordinating discharge: 25 minutes including assessment and coordination of care, as well as examination of the patient.   SIGNED:  Dessa Phi, DO [facility] Pager [phone]  If 7PM-7AM, please contact night-coverage [email or web address] Password [facility] 11/15/2018, 12:24 PM

## 2018-11-18 DIAGNOSIS — N2581 Secondary hyperparathyroidism of renal origin: Secondary | ICD-10-CM | POA: Diagnosis not present

## 2018-11-18 DIAGNOSIS — D631 Anemia in chronic kidney disease: Secondary | ICD-10-CM | POA: Diagnosis not present

## 2018-11-18 DIAGNOSIS — N186 End stage renal disease: Secondary | ICD-10-CM | POA: Diagnosis not present

## 2018-11-18 DIAGNOSIS — E1121 Type 2 diabetes mellitus with diabetic nephropathy: Secondary | ICD-10-CM | POA: Diagnosis not present

## 2018-11-20 DIAGNOSIS — D631 Anemia in chronic kidney disease: Secondary | ICD-10-CM | POA: Diagnosis not present

## 2018-11-20 DIAGNOSIS — E1121 Type 2 diabetes mellitus with diabetic nephropathy: Secondary | ICD-10-CM | POA: Diagnosis not present

## 2018-11-20 DIAGNOSIS — N2581 Secondary hyperparathyroidism of renal origin: Secondary | ICD-10-CM | POA: Diagnosis not present

## 2018-11-20 DIAGNOSIS — N186 End stage renal disease: Secondary | ICD-10-CM | POA: Diagnosis not present

## 2018-11-22 DIAGNOSIS — D631 Anemia in chronic kidney disease: Secondary | ICD-10-CM | POA: Diagnosis not present

## 2018-11-22 DIAGNOSIS — N2581 Secondary hyperparathyroidism of renal origin: Secondary | ICD-10-CM | POA: Diagnosis not present

## 2018-11-22 DIAGNOSIS — E1121 Type 2 diabetes mellitus with diabetic nephropathy: Secondary | ICD-10-CM | POA: Diagnosis not present

## 2018-11-22 DIAGNOSIS — N186 End stage renal disease: Secondary | ICD-10-CM | POA: Diagnosis not present

## 2018-11-23 ENCOUNTER — Emergency Department (HOSPITAL_COMMUNITY): Payer: Medicare Other

## 2018-11-23 ENCOUNTER — Encounter (HOSPITAL_COMMUNITY): Payer: Self-pay | Admitting: *Deleted

## 2018-11-23 ENCOUNTER — Inpatient Hospital Stay (HOSPITAL_COMMUNITY)
Admission: EM | Admit: 2018-11-23 | Discharge: 2018-11-26 | DRG: 393 | Disposition: A | Discharge status: Home / Self Care | Payer: Medicare Other | Attending: Nephrology | Admitting: Nephrology

## 2018-11-23 DIAGNOSIS — Z8249 Family history of ischemic heart disease and other diseases of the circulatory system: Secondary | ICD-10-CM | POA: Diagnosis not present

## 2018-11-23 DIAGNOSIS — K9189 Other postprocedural complications and disorders of digestive system: Secondary | ICD-10-CM | POA: Diagnosis present

## 2018-11-23 DIAGNOSIS — Z91013 Allergy to seafood: Secondary | ICD-10-CM

## 2018-11-23 DIAGNOSIS — K581 Irritable bowel syndrome with constipation: Secondary | ICD-10-CM | POA: Diagnosis present

## 2018-11-23 DIAGNOSIS — Z9049 Acquired absence of other specified parts of digestive tract: Secondary | ICD-10-CM

## 2018-11-23 DIAGNOSIS — I120 Hypertensive chronic kidney disease with stage 5 chronic kidney disease or end stage renal disease: Secondary | ICD-10-CM | POA: Diagnosis present

## 2018-11-23 DIAGNOSIS — N2581 Secondary hyperparathyroidism of renal origin: Secondary | ICD-10-CM | POA: Diagnosis present

## 2018-11-23 DIAGNOSIS — I4891 Unspecified atrial fibrillation: Secondary | ICD-10-CM | POA: Diagnosis present

## 2018-11-23 DIAGNOSIS — K838 Other specified diseases of biliary tract: Secondary | ICD-10-CM | POA: Diagnosis present

## 2018-11-23 DIAGNOSIS — Z79890 Hormone replacement therapy (postmenopausal): Secondary | ICD-10-CM

## 2018-11-23 DIAGNOSIS — Z882 Allergy status to sulfonamides status: Secondary | ICD-10-CM

## 2018-11-23 DIAGNOSIS — I10 Essential (primary) hypertension: Secondary | ICD-10-CM | POA: Diagnosis not present

## 2018-11-23 DIAGNOSIS — N250 Renal osteodystrophy: Secondary | ICD-10-CM | POA: Diagnosis not present

## 2018-11-23 DIAGNOSIS — E66813 Obesity, class 3: Secondary | ICD-10-CM | POA: Diagnosis present

## 2018-11-23 DIAGNOSIS — Z79899 Other long term (current) drug therapy: Secondary | ICD-10-CM

## 2018-11-23 DIAGNOSIS — Z7982 Long term (current) use of aspirin: Secondary | ICD-10-CM

## 2018-11-23 DIAGNOSIS — I9589 Other hypotension: Secondary | ICD-10-CM | POA: Diagnosis present

## 2018-11-23 DIAGNOSIS — E1129 Type 2 diabetes mellitus with other diabetic kidney complication: Secondary | ICD-10-CM

## 2018-11-23 DIAGNOSIS — Z794 Long term (current) use of insulin: Secondary | ICD-10-CM | POA: Diagnosis not present

## 2018-11-23 DIAGNOSIS — Z881 Allergy status to other antibiotic agents status: Secondary | ICD-10-CM

## 2018-11-23 DIAGNOSIS — I2510 Atherosclerotic heart disease of native coronary artery without angina pectoris: Secondary | ICD-10-CM | POA: Diagnosis present

## 2018-11-23 DIAGNOSIS — F329 Major depressive disorder, single episode, unspecified: Secondary | ICD-10-CM | POA: Diagnosis present

## 2018-11-23 DIAGNOSIS — Y838 Other surgical procedures as the cause of abnormal reaction of the patient, or of later complication, without mention of misadventure at the time of the procedure: Secondary | ICD-10-CM | POA: Diagnosis present

## 2018-11-23 DIAGNOSIS — Z992 Dependence on renal dialysis: Secondary | ICD-10-CM

## 2018-11-23 DIAGNOSIS — D631 Anemia in chronic kidney disease: Secondary | ICD-10-CM | POA: Diagnosis present

## 2018-11-23 DIAGNOSIS — K839 Disease of biliary tract, unspecified: Secondary | ICD-10-CM

## 2018-11-23 DIAGNOSIS — G4733 Obstructive sleep apnea (adult) (pediatric): Secondary | ICD-10-CM | POA: Diagnosis present

## 2018-11-23 DIAGNOSIS — J449 Chronic obstructive pulmonary disease, unspecified: Secondary | ICD-10-CM | POA: Diagnosis present

## 2018-11-23 DIAGNOSIS — E039 Hypothyroidism, unspecified: Secondary | ICD-10-CM | POA: Diagnosis present

## 2018-11-23 DIAGNOSIS — E1122 Type 2 diabetes mellitus with diabetic chronic kidney disease: Secondary | ICD-10-CM | POA: Diagnosis present

## 2018-11-23 DIAGNOSIS — E119 Type 2 diabetes mellitus without complications: Secondary | ICD-10-CM

## 2018-11-23 DIAGNOSIS — E1165 Type 2 diabetes mellitus with hyperglycemia: Secondary | ICD-10-CM | POA: Diagnosis not present

## 2018-11-23 DIAGNOSIS — K832 Perforation of bile duct: Secondary | ICD-10-CM | POA: Diagnosis not present

## 2018-11-23 DIAGNOSIS — Z87891 Personal history of nicotine dependence: Secondary | ICD-10-CM | POA: Diagnosis not present

## 2018-11-23 DIAGNOSIS — E669 Obesity, unspecified: Secondary | ICD-10-CM | POA: Diagnosis present

## 2018-11-23 DIAGNOSIS — N186 End stage renal disease: Secondary | ICD-10-CM | POA: Diagnosis present

## 2018-11-23 DIAGNOSIS — Z6836 Body mass index (BMI) 36.0-36.9, adult: Secondary | ICD-10-CM

## 2018-11-23 DIAGNOSIS — K811 Chronic cholecystitis: Secondary | ICD-10-CM | POA: Diagnosis not present

## 2018-11-23 LAB — CBC WITH DIFFERENTIAL/PLATELET
Abs Immature Granulocytes: 0.04 10*3/uL (ref 0.00–0.07)
Basophils Absolute: 0.1 10*3/uL (ref 0.0–0.1)
Basophils Relative: 1 %
Eosinophils Absolute: 0.4 10*3/uL (ref 0.0–0.5)
Eosinophils Relative: 5 %
HCT: 37.9 % — ABNORMAL LOW (ref 39.0–52.0)
Hemoglobin: 11.1 g/dL — ABNORMAL LOW (ref 13.0–17.0)
Immature Granulocytes: 1 %
Lymphocytes Relative: 16 %
Lymphs Abs: 1.2 10*3/uL (ref 0.7–4.0)
MCH: 27.1 pg (ref 26.0–34.0)
MCHC: 29.3 g/dL — ABNORMAL LOW (ref 30.0–36.0)
MCV: 92.7 fL (ref 80.0–100.0)
Monocytes Absolute: 0.8 10*3/uL (ref 0.1–1.0)
Monocytes Relative: 10 %
Neutro Abs: 5.3 10*3/uL (ref 1.7–7.7)
Neutrophils Relative %: 67 %
Platelets: 395 10*3/uL (ref 150–400)
RBC: 4.09 MIL/uL — ABNORMAL LOW (ref 4.22–5.81)
RDW: 17.6 % — ABNORMAL HIGH (ref 11.5–15.5)
WBC: 7.7 10*3/uL (ref 4.0–10.5)
nRBC: 0 % (ref 0.0–0.2)

## 2018-11-23 LAB — PHOSPHORUS: Phosphorus: 4.1 mg/dL (ref 2.5–4.6)

## 2018-11-23 LAB — COMPREHENSIVE METABOLIC PANEL
ALT: 13 U/L (ref 0–44)
AST: 17 U/L (ref 15–41)
Albumin: 2.9 g/dL — ABNORMAL LOW (ref 3.5–5.0)
Alkaline Phosphatase: 144 U/L — ABNORMAL HIGH (ref 38–126)
Anion gap: 11 (ref 5–15)
BUN: 21 mg/dL (ref 8–23)
CO2: 31 mmol/L (ref 22–32)
Calcium: 9.3 mg/dL (ref 8.9–10.3)
Chloride: 97 mmol/L — ABNORMAL LOW (ref 98–111)
Creatinine, Ser: 6.03 mg/dL — ABNORMAL HIGH (ref 0.61–1.24)
GFR calc Af Amer: 10 mL/min — ABNORMAL LOW (ref >60)
GFR calc non Af Amer: 8 mL/min — ABNORMAL LOW (ref >60)
Glucose, Bld: 101 mg/dL — ABNORMAL HIGH (ref 70–99)
Potassium: 3.7 mmol/L (ref 3.5–5.1)
Sodium: 139 mmol/L (ref 135–145)
Total Bilirubin: 0.2 mg/dL — ABNORMAL LOW (ref 0.3–1.2)
Total Protein: 6.4 g/dL — ABNORMAL LOW (ref 6.5–8.1)

## 2018-11-23 LAB — MAGNESIUM: Magnesium: 2.1 mg/dL (ref 1.7–2.4)

## 2018-11-23 LAB — GLUCOSE, CAPILLARY: Glucose-Capillary: 182 mg/dL — ABNORMAL HIGH (ref 70–99)

## 2018-11-23 LAB — PROTIME-INR
INR: 1.07
Prothrombin Time: 13.8 seconds (ref 11.4–15.2)

## 2018-11-23 LAB — LIPASE, BLOOD: Lipase: 21 U/L (ref 11–51)

## 2018-11-23 MED ORDER — ENOXAPARIN SODIUM 30 MG/0.3ML ~~LOC~~ SOLN
30.0000 mg | SUBCUTANEOUS | Status: DC
  Administered 2018-11-23 – 2018-11-24 (×2): 30 mg via SUBCUTANEOUS
  Filled 2018-11-23 (×3): qty 0.3

## 2018-11-23 MED ORDER — INSULIN DETEMIR 100 UNIT/ML ~~LOC~~ SOLN
14.0000 [IU] | Freq: Two times a day (BID) | SUBCUTANEOUS | Status: DC
  Administered 2018-11-23 – 2018-11-24 (×2): 14 [IU] via SUBCUTANEOUS
  Filled 2018-11-23 (×4): qty 0.14

## 2018-11-23 MED ORDER — ONDANSETRON HCL 4 MG/2ML IJ SOLN
4.0000 mg | Freq: Four times a day (QID) | INTRAMUSCULAR | Status: DC | PRN
  Administered 2018-11-25: 4 mg via INTRAVENOUS

## 2018-11-23 MED ORDER — LORATADINE 10 MG PO TABS
10.0000 mg | ORAL_TABLET | Freq: Every evening | ORAL | Status: DC
  Administered 2018-11-23 – 2018-11-25 (×3): 10 mg via ORAL
  Filled 2018-11-23 (×3): qty 1

## 2018-11-23 MED ORDER — MONTELUKAST SODIUM 10 MG PO TABS
10.0000 mg | ORAL_TABLET | Freq: Every evening | ORAL | Status: DC
  Administered 2018-11-23 – 2018-11-25 (×3): 10 mg via ORAL
  Filled 2018-11-23 (×3): qty 1

## 2018-11-23 MED ORDER — INSULIN ASPART 100 UNIT/ML ~~LOC~~ SOLN
0.0000 [IU] | Freq: Three times a day (TID) | SUBCUTANEOUS | Status: DC
  Administered 2018-11-24 – 2018-11-25 (×2): 2 [IU] via SUBCUTANEOUS
  Administered 2018-11-26: 3 [IU] via SUBCUTANEOUS

## 2018-11-23 MED ORDER — OXYCODONE HCL 5 MG PO TABS
5.0000 mg | ORAL_TABLET | ORAL | Status: DC | PRN
  Administered 2018-11-23 – 2018-11-24 (×2): 5 mg via ORAL
  Administered 2018-11-24: 10 mg via ORAL
  Administered 2018-11-24: 5 mg via ORAL
  Administered 2018-11-25 – 2018-11-26 (×3): 10 mg via ORAL
  Filled 2018-11-23: qty 1
  Filled 2018-11-23 (×5): qty 2
  Filled 2018-11-23 (×2): qty 1

## 2018-11-23 MED ORDER — ESCITALOPRAM OXALATE 10 MG PO TABS
10.0000 mg | ORAL_TABLET | Freq: Every evening | ORAL | Status: DC
  Administered 2018-11-23 – 2018-11-25 (×3): 10 mg via ORAL
  Filled 2018-11-23 (×3): qty 1

## 2018-11-23 MED ORDER — TECHNETIUM TC 99M MEBROFENIN IV KIT
5.0000 | PACK | Freq: Once | INTRAVENOUS | Status: AC | PRN
  Administered 2018-11-23: 5 via INTRAVENOUS

## 2018-11-23 MED ORDER — ONDANSETRON HCL 4 MG/2ML IJ SOLN: 4.0000 mg | Freq: Four times a day (QID) | INTRAMUSCULAR | Status: DC | PRN

## 2018-11-23 MED ORDER — FENTANYL CITRATE (PF) 100 MCG/2ML IJ SOLN: 25.0000 ug | INTRAMUSCULAR | Status: DC | PRN

## 2018-11-23 MED ORDER — SODIUM CHLORIDE 0.9 % IV SOLN
INTRAVENOUS | Status: DC
  Administered 2018-11-23 – 2018-11-25 (×2): via INTRAVENOUS

## 2018-11-23 MED ORDER — BUPROPION HCL ER (XL) 150 MG PO TB24
150.0000 mg | ORAL_TABLET | Freq: Every day | ORAL | Status: DC
  Administered 2018-11-23 – 2018-11-26 (×3): 150 mg via ORAL
  Filled 2018-11-23 (×3): qty 1

## 2018-11-23 MED ORDER — LIDOCAINE-PRILOCAINE 2.5-2.5 % EX CREA: 1.0000 "application " | TOPICAL_CREAM | CUTANEOUS | Status: DC

## 2018-11-23 MED ORDER — ACETAMINOPHEN 650 MG RE SUPP: 650.0000 mg | Freq: Four times a day (QID) | RECTAL | Status: DC | PRN

## 2018-11-23 MED ORDER — ENOXAPARIN SODIUM 40 MG/0.4ML ~~LOC~~ SOLN: 40.0000 mg | SUBCUTANEOUS | Status: DC

## 2018-11-23 MED ORDER — ACETAMINOPHEN 325 MG PO TABS: 650.0000 mg | ORAL_TABLET | Freq: Four times a day (QID) | ORAL | Status: DC | PRN

## 2018-11-23 MED ORDER — GABAPENTIN 300 MG PO CAPS
300.0000 mg | ORAL_CAPSULE | Freq: Every day | ORAL | Status: DC
  Administered 2018-11-23 – 2018-11-26 (×3): 300 mg via ORAL
  Filled 2018-11-23 (×3): qty 1

## 2018-11-23 MED ORDER — DOCUSATE SODIUM 100 MG PO CAPS
100.0000 mg | ORAL_CAPSULE | Freq: Two times a day (BID) | ORAL | Status: DC
  Administered 2018-11-23 – 2018-11-26 (×4): 100 mg via ORAL
  Filled 2018-11-23 (×5): qty 1

## 2018-11-23 MED ORDER — LACTULOSE 10 GM/15ML PO SOLN
10.0000 g | Freq: Every day | ORAL | Status: DC
  Filled 2018-11-23 (×2): qty 15

## 2018-11-23 MED ORDER — ONDANSETRON HCL 4 MG PO TABS: 4.0000 mg | ORAL_TABLET | Freq: Four times a day (QID) | ORAL | Status: DC | PRN

## 2018-11-23 MED ORDER — ONDANSETRON 4 MG PO TBDP: 4.0000 mg | ORAL_TABLET | Freq: Four times a day (QID) | ORAL | Status: DC | PRN

## 2018-11-23 MED ORDER — ATORVASTATIN CALCIUM 10 MG PO TABS
20.0000 mg | ORAL_TABLET | Freq: Every evening | ORAL | Status: DC
  Administered 2018-11-23 – 2018-11-25 (×3): 20 mg via ORAL
  Filled 2018-11-23 (×3): qty 2

## 2018-11-23 MED ORDER — LINACLOTIDE 145 MCG PO CAPS
290.0000 ug | ORAL_CAPSULE | Freq: Every day | ORAL | Status: DC
  Filled 2018-11-23 (×4): qty 2

## 2018-11-23 MED ORDER — LEVOTHYROXINE SODIUM 25 MCG PO TABS
125.0000 ug | ORAL_TABLET | Freq: Every day | ORAL | Status: DC
  Administered 2018-11-24 – 2018-11-26 (×3): 125 ug via ORAL
  Filled 2018-11-23 (×3): qty 1

## 2018-11-23 MED ORDER — SEVELAMER CARBONATE 800 MG PO TABS
2400.0000 mg | ORAL_TABLET | Freq: Two times a day (BID) | ORAL | Status: DC
  Administered 2018-11-24 – 2018-11-26 (×3): 2400 mg via ORAL
  Filled 2018-11-23 (×4): qty 3

## 2018-11-23 MED ORDER — MIDODRINE HCL 5 MG PO TABS
2.5000 mg | ORAL_TABLET | Freq: Three times a day (TID) | ORAL | Status: DC
  Administered 2018-11-24 – 2018-11-26 (×6): 2.5 mg via ORAL
  Filled 2018-11-23 (×6): qty 1

## 2018-11-23 NOTE — ED Notes (Signed)
[phone], Pts wife phone number

## 2018-11-23 NOTE — ED Notes (Signed)
Pt has a drain placed from recent gallbladder surgery. Pt states his doctor sent him here based the drainage color being green. No other complaints at this time

## 2018-11-23 NOTE — ED Notes (Signed)
Per [place] PA, pt is allowed to eat and drink. Pt given ginger ale and sandwich

## 2018-11-23 NOTE — H&P (Signed)
Christian Dalton is an 77 y.o. male.   Chief Complaint: Bile leak HPI: Patient presents 8 days after laparoscopic cholecystectomy with Dr. Coralie Keens for chronic cholecystitis.  He has a history of a cholecystostomy tube placed 3 months ago.  He developed worsening pain and required cholecystectomy last week.  He did well initially went home with a drain in place.  He returns with a 3-day history of bilious drainage from the drain which is new.  He was seen today in the office with Dr. Dema Severin who sent him to the emergency room for further evaluation.  HIDA study showed a bile leak.  He is in no pain denies fever or chills and actually is hungry.  He received dialysis yesterday and gets it Monday Wednesday Friday.  Past Medical History:  Diagnosis Date  . Acute cholecystitis 08/17/2018  . Atrial fibrillation ([place])   . CAD (coronary artery disease)   . Cholecystitis   . COPD (chronic obstructive pulmonary disease) ([place])   . Diabetes mellitus without complication ([place])   . ESRD (end stage renal disease) on dialysis ([place])   . Hypertension   . OSA (obstructive sleep apnea)   . Renal disorder   . Spinal stenosis     Past Surgical History:  Procedure Laterality Date  . CHOLECYSTECTOMY N/A 11/14/2018   Procedure: LAPAROSCOPIC CHOLECYSTECTOMY WITH INTRAOPERATIVE CHOLANGIOGRAM;  Surgeon: Coralie Keens, MD;  Location: [facility];  Service: General;  Laterality: N/A;  . HAND SURGERY Right   . IR DIALY SHUNT INTRO NEEDLE/INTRACATH INITIAL W/IMG LEFT Left 08/20/2018  . IR PERC CHOLECYSTOSTOMY  08/19/2018  . IR RADIOLOGIST EVAL & MGMT  09/29/2018  . SHOULDER SURGERY Right   . TIBIA FRACTURE SURGERY      Family History  Problem Relation Age of Onset  . Heart disease Mother   . Heart disease Father   . Heart disease Sister    Social History:  reports that he has never smoked. He has quit using smokeless tobacco. He reports previous alcohol use. He reports that he does not use drugs.  Allergies:   Allergies  Allergen Reactions  . Avelox [Moxifloxacin Hcl] Other (See Comments)    unknown  . Shellfish Allergy Nausea And Vomiting    Stomach  . Sulfa Antibiotics Other (See Comments)    unknown  . Tetracyclines & Related Other (See Comments)    unknown    (Not in a hospital admission)   Results for orders placed or performed during the hospital encounter of 11/23/18 (from the past 48 hour(s))  Comprehensive metabolic panel     Status: Abnormal   Collection Time: 11/23/18 12:31 PM  Result Value Ref Range   Sodium 139 135 - 145 mmol/L   Potassium 3.7 3.5 - 5.1 mmol/L   Chloride 97 (L) 98 - 111 mmol/L   CO2 31 22 - 32 mmol/L   Glucose, Bld 101 (H) 70 - 99 mg/dL   BUN 21 8 - 23 mg/dL   Creatinine, Ser 6.03 (H) 0.61 - 1.24 mg/dL   Calcium 9.3 8.9 - 10.3 mg/dL   Total Protein 6.4 (L) 6.5 - 8.1 g/dL   Albumin 2.9 (L) 3.5 - 5.0 g/dL   AST 17 15 - 41 U/L   ALT 13 0 - 44 U/L   Alkaline Phosphatase 144 (H) 38 - 126 U/L   Total Bilirubin 0.2 (L) 0.3 - 1.2 mg/dL   GFR calc non Af Amer 8 (L) >60 mL/min   GFR calc Af Amer 10 (L) >  60 mL/min   Anion gap 11 5 - 15    Comment: Performed at [facility] [address], [place], [place] [address]  CBC with Differential     Status: Abnormal   Collection Time: 11/23/18 12:31 PM  Result Value Ref Range   WBC 7.7 4.0 - 10.5 K/uL   RBC 4.09 (L) 4.22 - 5.81 MIL/uL   Hemoglobin 11.1 (L) 13.0 - 17.0 g/dL   HCT 37.9 (L) 39.0 - 52.0 %   MCV 92.7 80.0 - 100.0 fL   MCH 27.1 26.0 - 34.0 pg   MCHC 29.3 (L) 30.0 - 36.0 g/dL   RDW 17.6 (H) 11.5 - 15.5 %   Platelets 395 150 - 400 K/uL   nRBC 0.0 0.0 - 0.2 %   Neutrophils Relative % 67 %   Neutro Abs 5.3 1.7 - 7.7 K/uL   Lymphocytes Relative 16 %   Lymphs Abs 1.2 0.7 - 4.0 K/uL   Monocytes Relative 10 %   Monocytes Absolute 0.8 0.1 - 1.0 K/uL   Eosinophils Relative 5 %   Eosinophils Absolute 0.4 0.0 - 0.5 K/uL   Basophils Relative 1 %   Basophils Absolute 0.1 0.0 - 0.1 K/uL   Immature  Granulocytes 1 %   Abs Immature Granulocytes 0.04 0.00 - 0.07 K/uL    Comment: Performed at [facility] [address], [place], [place] [address]  Lipase, blood     Status: None   Collection Time: 11/23/18 12:31 PM  Result Value Ref Range   Lipase 21 11 - 51 U/L    Comment: Performed at [facility], [place] [address], [place], [place] [address]   Nm Hepatobiliary Including Gb  Result Date: 11/23/2018 CLINICAL DATA:  77 year old male with chronic cholecystitis percutaneous cholecystostomy earlier this year but then status post laparoscopic cholecystectomy on 11/14/2018 with a 7 [place] Blake drain placed into the gallbladder fossa at the time of surgery. Increasing black and greenish fluid from the drain over the past 2 days. EXAM: NUCLEAR MEDICINE HEPATOBILIARY IMAGING TECHNIQUE: Sequential images of the abdomen were obtained out to 60 minutes following intravenous administration of radiopharmaceutical. RADIOPHARMACEUTICALS:  Five mCi Tc-44m  Choletec IV COMPARISON:  CT Abdomen and Pelvis 11/11/2018 FINDINGS: Prompt radiotracer uptake by the liver and clearance of the blood pool. Prompt visualization of the CBD by 10 minutes. Normal appearing CBD, duodenum, and progressive small bowel activity between ten and 40 minutes into the exam. No abnormal radiotracer accumulation identified on those images. However, abruptly beginning on the 45 minute image there is abundant radiotracer activity within the percutaneous drain (image 52) which mildly increases until the study was concluded at 55 minutes. IMPRESSION: Positive for biliary leak: Abundant radiotracer activity within the percutaneous drain beginning at the 45 minutes mark of this exam. This was discussed by telephone with PA LINDSEY LAYDEN in the ED on 11/23/2018 at 16:19 . Normal appearing CBD and small bowel activity. Electronically Signed   By: Genevie Ann M.D.   On: 11/23/2018 16:20    Review of Systems  All other systems reviewed and  are negative.   Blood pressure 115/72, pulse 86, temperature 98.7 F (37.1 C), temperature source Oral, resp. rate 20, SpO2 97 %. Physical Exam  Constitutional: He appears well-developed and well-nourished.  HENT:  Head: Normocephalic and atraumatic.  Eyes: Pupils are equal, round, and reactive to light. No scleral icterus.  Neck: Normal range of motion. Neck supple.  Cardiovascular: Normal rate.  Respiratory: Effort normal.  GI:  Port sites clean dry intact.  JP drain has a bulb that shows of bile.  Soft nontender without rebound guarding or signs of peritonitis.  Musculoskeletal: Normal range of motion.  Neurological: He is alert.     Assessment/Plan Postoperative bile leak status post laparoscopic cholecystectomy for severe chronic cholecystitis  The leak is well controlled with his drain.  I discussed the case with GI medicine and nephrology.  They will see him in consultation.  He will be admitted.  N.p.o. after midnight for possible intervention tomorrow or Thursday.  We will place him on a sliding scale insulin and put replacement back on his home medications for now.  Nephrology will see and dialyze him as needed.  Turner Daniels, MD 11/23/2018, 5:33 PM

## 2018-11-23 NOTE — H&P (Signed)
History and Physical    Christian Dalton YFV:[id number] DOB: 01/09/1941 DOA: 11/23/2018  PCP: Venetia Maxon, Sharon Mt, MD Patient coming from: Surgery Clinic  I have personally briefly reviewed patient's old medical records in [place]  Chief Complaint: Increased drainage from biliary drain  HPI: Christian Dalton is a 77 y.o. male with medical history significant for ESRD on TTS HD via LUE AVF, CAD, hypothyroidism, diabetes mellitus type 2, hypertension, undocumented COPD, OSA on CPAP and recent ascending cholangitis status post cholecystostomy drain followed by laparoscopic cholecystectomy on 12/22 who presented to the ED from surgery clinic with 3 to 4 days of mild abdominal pain and increased greenish drainage from his biliary drain.  There was concern for infection versus biliary leak, so the patient was instructed to go to the ED for HIDA scan.  HIDA scan demonstrated the presence of a biliary leak.  He denies fever, chills, nausea, vomiting, stool color change, blood in stool, urinary changes.  There is no blood in his JP drain. Last HD session was Monday (dialysis was rescheduled this week due to the holiday). Surgery requested admission to medicine due to the patient's underlying complex medical history.  ED Course: In the ED, the patient is afebrile, hemodynamically stable and saturating comfortably on room air.  Labs show hemoglobin of 11.1, platelets 395, BUN 21, creatinine 6.03, normal liver function.  HIDA scan positive for biliary leak. Dr. Brantley Stage of Surgery saw the patient in the ED. Nephrology was also consulted for resumption of HD. Patient stable on arrival to the floor.  Review of Systems: As per HPI otherwise 10 point review of systems negative.    Past Medical History:  Diagnosis Date  . Acute cholecystitis 08/17/2018  . Atrial fibrillation ([place])   . CAD (coronary artery disease)   . Cholecystitis   . COPD (chronic obstructive pulmonary disease) ([place])   . Diabetes  mellitus without complication ([place])   . ESRD (end stage renal disease) on dialysis ([place])   . Hypertension   . OSA (obstructive sleep apnea)   . Renal disorder   . Spinal stenosis     Past Surgical History:  Procedure Laterality Date  . CHOLECYSTECTOMY N/A 11/14/2018   Procedure: LAPAROSCOPIC CHOLECYSTECTOMY WITH INTRAOPERATIVE CHOLANGIOGRAM;  Surgeon: Coralie Keens, MD;  Location: [facility];  Service: General;  Laterality: N/A;  . HAND SURGERY Right   . IR DIALY SHUNT INTRO NEEDLE/INTRACATH INITIAL W/IMG LEFT Left 08/20/2018  . IR PERC CHOLECYSTOSTOMY  08/19/2018  . IR RADIOLOGIST EVAL & MGMT  09/29/2018  . SHOULDER SURGERY Right   . TIBIA FRACTURE SURGERY       reports that he has never smoked. He has quit using smokeless tobacco. He reports previous alcohol use. He reports that he does not use drugs.  Allergies  Allergen Reactions  . Avelox [Moxifloxacin Hcl] Other (See Comments)    unknown  . Shellfish Allergy Nausea And Vomiting    Stomach  . Sulfa Antibiotics Other (See Comments)    unknown  . Tetracyclines & Related Other (See Comments)    unknown    Family History  Problem Relation Age of Onset  . Heart disease Mother   . Heart disease Father   . Heart disease Sister    Prior to Admission medications   Medication Sig Start Date End Date Taking? Authorizing Provider  aspirin EC 81 MG tablet Take 81 mg by mouth every other day.    Yes [provider]  atorvastatin (LIPITOR) 20 MG tablet Take  1 tablet (20 mg total) by mouth every evening. Patient taking differently: Take 10 mg by mouth every evening.  08/24/18  Yes Elgergawy, Silver Huguenin, MD  buPROPion (WELLBUTRIN XL) 150 MG 24 hr tablet Take 150 mg by mouth at bedtime.    Yes [provider]  docusate sodium (COLACE) 100 MG capsule Take 100 mg by mouth 2 (two) times daily.    Yes [provider]  escitalopram (LEXAPRO) 10 MG tablet Take 10 mg every evening by mouth. 08/30/15  Yes [provider]  gabapentin (NEURONTIN) 300 MG capsule Take 300 mg by mouth at bedtime.   Yes [provider]  insulin detemir (LEVEMIR) 100 UNIT/ML injection Inject 0.14 mLs (14 Units total) into the skin 2 (two) times daily. Patient taking differently: Inject 16-20 Units into the skin See admin instructions. Sliding scale 20 mg in the morning and 16 in the evening 08/24/18  Yes Elgergawy, Silver Huguenin, MD  Insulin Lispro Prot & Lispro (HUMALOG MIX 75/25 KWIKPEN) (75-25) 100 UNIT/ML Kwikpen Take 10 Units by mouth 2 (two) times daily. 07/12/18  Yes [provider]  lactulose, encephalopathy, (CHRONULAC) 10 GM/15ML SOLN Take 15 mLs by mouth daily before supper.  08/02/18  Yes [provider]  levothyroxine (SYNTHROID, LEVOTHROID) 125 MCG tablet Take 125 mcg daily by mouth.   Yes [provider]  lidocaine-prilocaine (EMLA) cream Apply 1 application topically See admin instructions. To access site (AVF) 1-2 hours before dialysis. Cover with occlusive dressing (Saran warp) 08/02/18  Yes [provider]  linaclotide (LINZESS) 290 MCG CAPS capsule Take 290 mcg by mouth daily after supper.    Yes [provider]  loratadine (CLARITIN) 10 MG tablet Take 10 mg every evening by mouth.   Yes [provider]  midodrine (PROAMATINE) 2.5 MG tablet Take 2.5 mg by mouth 3 (three) times daily. 09/20/18  Yes [provider]  montelukast (SINGULAIR) 10 MG tablet Take 10 mg every evening by mouth.   Yes [provider]  sevelamer carbonate (RENVELA) 800 MG tablet Take 3 tablets (2,400 mg total) by mouth 3 (three) times daily with meals. Patient taking differently: Take 2,400 mg by mouth 2 (two) times daily.  08/24/18  Yes Elgergawy, Silver Huguenin, MD    Physical Exam: Vitals:   11/23/18 1200 11/23/18 1745 11/23/18 1810 11/23/18 2017  BP: 115/72 (!) 117/55  135/67  Pulse: 86  84 83  Resp:    19  Temp:    98.4 F (36.9 C)  TempSrc:    Oral  SpO2:  97%  97% 95%  Weight:    119.1 kg  Height:    5\' 10"  (1.778 m)    Constitutional: NAD, calm, comfortable Eyes: PERRL, lids and conjunctivae normal ENMT: Mucous membranes are moist. Posterior pharynx clear of any exudate or lesions. Poor dentition.  Neck: normal, supple, no masses Respiratory: clear to auscultation bilaterally, no wheezing, no crackles. Normal respiratory effort. No accessory muscle use.  Cardiovascular: Regular rate and rhythm, no murmurs / rubs / gallops. No extremity edema. 2+ pedal pulses. No carotid bruits.  Abdomen: no tenderness, no masses palpated. JP drain in RUQ with green, bilious drainage. Bowel sounds positive.  Musculoskeletal: no clubbing / cyanosis. No joint deformity upper and lower extremities. Good ROM, no contractures. Normal muscle tone.  Skin: no rashes, lesions, ulcers. No induration Neurologic: CN 2-12 grossly intact. Sensation intact, DTR normal. Strength 5/5 in all 4.  Psychiatric: Normal judgment and insight. Alert and oriented x  3. Normal mood.    Labs on Admission: I have personally reviewed following labs and imaging studies  CBC: Recent Labs  Lab 11/23/18 1231  WBC 7.7  NEUTROABS 5.3  HGB 11.1*  HCT 37.9*  MCV 92.7  PLT 681   Basic Metabolic Panel: Recent Labs  Lab 11/23/18 1231 11/23/18 2248  NA 139  --   K 3.7  --   CL 97*  --   CO2 31  --   GLUCOSE 101*  --   BUN 21  --   CREATININE 6.03*  --   CALCIUM 9.3  --   MG  --  2.1  PHOS  --  4.1   GFR: Estimated Creatinine Clearance: 13.7 mL/min (A) (by C-G formula based on SCr of 6.03 mg/dL (H)). Liver Function Tests: Recent Labs  Lab 11/23/18 1231  AST 17  ALT 13  ALKPHOS 144*  BILITOT 0.2*  PROT 6.4*  ALBUMIN 2.9*   Recent Labs  Lab 11/23/18 1231  LIPASE 21   No results for input(s): AMMONIA in the last 168 hours. Coagulation Profile: Recent Labs  Lab 11/23/18 2248  INR 1.07   Cardiac Enzymes: No results for input(s): CKTOTAL, CKMB, CKMBINDEX,  TROPONINI in the last 168 hours. BNP (last 3 results) No results for input(s): PROBNP in the last 8760 hours. HbA1C: No results for input(s): HGBA1C in the last 72 hours. CBG: Recent Labs  Lab 11/23/18 2136  GLUCAP 182*   Lipid Profile: No results for input(s): CHOL, HDL, LDLCALC, TRIG, CHOLHDL, LDLDIRECT in the last 72 hours. Thyroid Function Tests: No results for input(s): TSH, T4TOTAL, FREET4, T3FREE, THYROIDAB in the last 72 hours. Anemia Panel: No results for input(s): VITAMINB12, FOLATE, FERRITIN, TIBC, IRON, RETICCTPCT in the last 72 hours. Urine analysis: No results found for: COLORURINE, APPEARANCEUR, LABSPEC, PHURINE, GLUCOSEU, HGBUR, BILIRUBINUR, KETONESUR, PROTEINUR, UROBILINOGEN, NITRITE, LEUKOCYTESUR  Radiological Exams on Admission: Nm Hepatobiliary Including Gb  Result Date: 11/23/2018 CLINICAL DATA:  77 year old male with chronic cholecystitis percutaneous cholecystostomy earlier this year but then status post laparoscopic cholecystectomy on 11/14/2018 with a 79 [place] Blake drain placed into the gallbladder fossa at the time of surgery. Increasing black and greenish fluid from the drain over the past 2 days. EXAM: NUCLEAR MEDICINE HEPATOBILIARY IMAGING TECHNIQUE: Sequential images of the abdomen were obtained out to 60 minutes following intravenous administration of radiopharmaceutical. RADIOPHARMACEUTICALS:  Five mCi Tc-80m  Choletec IV COMPARISON:  CT Abdomen and Pelvis 11/11/2018 FINDINGS: Prompt radiotracer uptake by the liver and clearance of the blood pool. Prompt visualization of the CBD by 10 minutes. Normal appearing CBD, duodenum, and progressive small bowel activity between ten and 40 minutes into the exam. No abnormal radiotracer accumulation identified on those images. However, abruptly beginning on the 45 minute image there is abundant radiotracer activity within the percutaneous drain (image 52) which mildly increases until the study was concluded at 55  minutes. IMPRESSION: Positive for biliary leak: Abundant radiotracer activity within the percutaneous drain beginning at the 45 minutes mark of this exam. This was discussed by telephone with PA LINDSEY LAYDEN in the ED on 11/23/2018 at 16:19 . Normal appearing CBD and small bowel activity. Electronically Signed   By: Genevie Ann M.D.   On: 11/23/2018 16:20    Assessment/Plan Active Problems:   Anastomotic leak of biliary tree   Bile leak, postoperative  1. Postoperative biliary leak - Plan for ERCP in AM - Surgery following - Low threshold for repeat CT a/p and Abx if worsening  abd pain or fever - NPO at midnight - Pain control - Daily CMP  2. ESRD on TThS HD - Nephrology consulted - HD per Nephro - No acute HD needs on admission - Avoid nephrotoxins - Renally dose meds - Continue sevelamer when diet is resumed - Daily BMP, Mg, PO4  Chronic medical conditions: - CAD: continue home ASA, statin - Chronic hypotension: continue midodrine per home - DM2: continue home Levemir, gabapentin; SSI, FSBS Q4H while NPO - Hypothyroidism: continue home Synthroid - IBS-C: continue home Linzess, bowel regimen - Depression: continue home Wellbutrin, Lexapro  DVT prophylaxis: Heparin Code Status: Full Disposition Plan: Home in 1-2 days Consults called: Surgery, Nephrology Admission status: Inpatient   Bennie Pierini MD [facility]  If 7PM-7AM, please contact night-coverage [email or web address] Password [facility]  11/23/2018, 11:42 PM

## 2018-11-23 NOTE — ED Notes (Signed)
ED TO INPATIENT HANDOFF REPORT  Name/Age/Gender Marlane Hatcher 77 y.o. male  Code Status Code Status History    Date Active Date Inactive Code Status Order ID Comments User Context   11/11/2018 2305 11/15/2018 1652 Full Code [id number]  Elwyn Reach, MD Inpatient   08/17/2018 1857 08/24/2018 2217 Full Code [id number]  Johnson-Pitts, Wells Guiles, MD Inpatient      Home/SNF/Other Home  Chief Complaint Post-Op Problem  Level of Care/Admitting Diagnosis ED Disposition    ED Disposition Condition Comment   Admit  Hospital Area: [facility] [[id number]]  Level of Care: Med-Surg [16]  Diagnosis: Anastomotic leak of biliary tree [660630]  Admitting Physician: Bennie Pierini [[id number]]  Attending Physician: Jonnie Finner, [place] [[id number]]  Estimated length of stay: past midnight tomorrow  Certification:: I certify this patient will need inpatient services for at least 2 midnights  PT Class (Do Not Modify): Inpatient [101]  PT Acc Code (Do Not Modify): Private [1]       Medical History Past Medical History:  Diagnosis Date  . Acute cholecystitis 08/17/2018  . Atrial fibrillation ([place])   . CAD (coronary artery disease)   . Cholecystitis   . COPD (chronic obstructive pulmonary disease) ([place])   . Diabetes mellitus without complication ([place])   . ESRD (end stage renal disease) on dialysis ([place])   . Hypertension   . OSA (obstructive sleep apnea)   . Renal disorder   . Spinal stenosis     Allergies Allergies  Allergen Reactions  . Avelox [Moxifloxacin Hcl] Other (See Comments)    unknown  . Shellfish Allergy Nausea And Vomiting    Stomach  . Sulfa Antibiotics Other (See Comments)    unknown  . Tetracyclines & Related Other (See Comments)    unknown    IV Location/Drains/Wounds Patient Lines/Drains/Airways Status   Active Line/Drains/Airways    Name:   Placement date:   Placement time:   Site:   Days:   Fistula / Graft Left Upper arm Arteriovenous fistula    08/17/18    1907    Upper arm   98   Fistula / Graft Left Upper arm Arteriovenous fistula   -    -    Upper arm      Closed System Drain 1 Anterior;Right RUQ Other (Comment) 10.2 Fr.   08/19/18    1605    RUQ   96   Closed System Drain 1 Right;Lateral Abdomen Bulb (JP) 19 Fr.   11/14/18    1120    Abdomen   9   Biliary Tube RUQ   -    -    RUQ      Incision (Closed) 11/14/18 Abdomen Other (Comment)   11/14/18    1133     9   Incision (Closed) 11/14/18 Abdomen Right   11/14/18    1137     9   Incision (Closed) 11/14/18 Abdomen Right   11/14/18    1137     9   Incision - 4 Ports Abdomen Umbilicus Medial;Upper Right;Lateral Right;Medial   11/14/18    1018     9          Labs/Imaging Results for orders placed or performed during the hospital encounter of 11/23/18 (from the past 48 hour(s))  Comprehensive metabolic panel     Status: Abnormal   Collection Time: 11/23/18 12:31 PM  Result Value Ref Range   Sodium 139 135 - 145 mmol/L   Potassium 3.7 3.5 -  5.1 mmol/L   Chloride 97 (L) 98 - 111 mmol/L   CO2 31 22 - 32 mmol/L   Glucose, Bld 101 (H) 70 - 99 mg/dL   BUN 21 8 - 23 mg/dL   Creatinine, Ser 6.03 (H) 0.61 - 1.24 mg/dL   Calcium 9.3 8.9 - 10.3 mg/dL   Total Protein 6.4 (L) 6.5 - 8.1 g/dL   Albumin 2.9 (L) 3.5 - 5.0 g/dL   AST 17 15 - 41 U/L   ALT 13 0 - 44 U/L   Alkaline Phosphatase 144 (H) 38 - 126 U/L   Total Bilirubin 0.2 (L) 0.3 - 1.2 mg/dL   GFR calc non Af Amer 8 (L) >60 mL/min   GFR calc Af Amer 10 (L) >60 mL/min   Anion gap 11 5 - 15    Comment: Performed at [facility], [address] [place], [place] [address]  CBC with Differential     Status: Abnormal   Collection Time: 11/23/18 12:31 PM  Result Value Ref Range   WBC 7.7 4.0 - 10.5 K/uL   RBC 4.09 (L) 4.22 - 5.81 MIL/uL   Hemoglobin 11.1 (L) 13.0 - 17.0 g/dL   HCT 37.9 (L) 39.0 - 52.0 %   MCV 92.7 80.0 - 100.0 fL   MCH 27.1 26.0 - 34.0 pg   MCHC 29.3 (L) 30.0 - 36.0 g/dL   RDW 17.6 (H) 11.5 - 15.5 %    Platelets 395 150 - 400 K/uL   nRBC 0.0 0.0 - 0.2 %   Neutrophils Relative % 67 %   Neutro Abs 5.3 1.7 - 7.7 K/uL   Lymphocytes Relative 16 %   Lymphs Abs 1.2 0.7 - 4.0 K/uL   Monocytes Relative 10 %   Monocytes Absolute 0.8 0.1 - 1.0 K/uL   Eosinophils Relative 5 %   Eosinophils Absolute 0.4 0.0 - 0.5 K/uL   Basophils Relative 1 %   Basophils Absolute 0.1 0.0 - 0.1 K/uL   Immature Granulocytes 1 %   Abs Immature Granulocytes 0.04 0.00 - 0.07 K/uL    Comment: Performed at [facility] [address], [place], [place] [address]  Lipase, blood     Status: None   Collection Time: 11/23/18 12:31 PM  Result Value Ref Range   Lipase 21 11 - 51 U/L    Comment: Performed at [facility], [place] [address] [place], [place] [address]   Nm Hepatobiliary Including Gb  Result Date: 11/23/2018 CLINICAL DATA:  77 year old male with chronic cholecystitis percutaneous cholecystostomy earlier this year but then status post laparoscopic cholecystectomy on 11/14/2018 with a 29 [place] Blake drain placed into the gallbladder fossa at the time of surgery. Increasing black and greenish fluid from the drain over the past 2 days. EXAM: NUCLEAR MEDICINE HEPATOBILIARY IMAGING TECHNIQUE: Sequential images of the abdomen were obtained out to 60 minutes following intravenous administration of radiopharmaceutical. RADIOPHARMACEUTICALS:  Five mCi Tc-72m  Choletec IV COMPARISON:  CT Abdomen and Pelvis 11/11/2018 FINDINGS: Prompt radiotracer uptake by the liver and clearance of the blood pool. Prompt visualization of the CBD by 10 minutes. Normal appearing CBD, duodenum, and progressive small bowel activity between ten and 40 minutes into the exam. No abnormal radiotracer accumulation identified on those images. However, abruptly beginning on the 45 minute image there is abundant radiotracer activity within the percutaneous drain (image 52) which mildly increases until the study was concluded at 55 minutes.  IMPRESSION: Positive for biliary leak: Abundant radiotracer  activity within the percutaneous drain beginning at the 45 minutes mark of this exam. This was discussed by telephone with PA LINDSEY LAYDEN in the ED on 11/23/2018 at 16:19 . Normal appearing CBD and small bowel activity. Electronically Signed   By: Genevie Ann M.D.   On: 11/23/2018 16:20    Pending Labs [employer] (From admission, onward)    Start     Ordered   Signed and Held  Phosphorus  Add-on,   R     Signed and Held   Signed and Held  Magnesium  Add-on,   R     Signed and Held   Signed and Held  Protime-INR  Once,   R     Signed and Held   Signed and Held  Comprehensive metabolic panel  Tomorrow morning,   R     Signed and Held   Signed and Held  CBC  Tomorrow morning,   R     Signed and Held          Vitals/Pain Today's Vitals   11/23/18 1001 11/23/18 1200 11/23/18 1745 11/23/18 1810  BP: 124/66 115/72 (!) 117/55   Pulse: 96 86  84  Resp: 20     Temp: 98.7 F (37.1 C)     TempSrc: Oral     SpO2: 100% 97%  97%  PainSc:        Isolation Precautions No active isolations  Medications Medications  technetium TC 33M mebrofenin (CHOLETEC) injection 5 millicurie (5 millicuries Intravenous Contrast Given 11/23/18 1315)    Mobility walks

## 2018-11-23 NOTE — ED Triage Notes (Signed)
Pt in with concerns that his abdominal drain is infected, this was placed after his gallbladder surgery, c/o increased pain and green drainage from his tubing, denies fever at home, Dr. Ninfa Linden sees pt

## 2018-11-23 NOTE — ED Provider Notes (Signed)
[facility] EMERGENCY DEPARTMENT Provider Note   CSN: [id number] Arrival date & time: 11/23/18  3794     History   Chief Complaint Chief Complaint  Patient presents with  . Post-op Problem    HPI Dainel Spink is a 77 y.o. male thousand history of A. fib, CAD, cholecystitis, COPD, diabetes, ESRD (T, TH, S dialysis) presents for evaluation of postop, patient.  Patient reports he is status post lap chole on 11/14/18.  At that time, he had a biliary drain placed.  He reports initially, was draining red fluid but states her last 2 days, the fluid has become a dark green color.  Additionally, he reports that he was told that the draining would stop eventually but he states is continued he has had a change it multiple times a day.  Wife changed during this morning and noted that she had a change in a few times since coming to the ED.  On ED arrival, is filled with 75 cc of dark/green fluid.  Patient reports some pain in the area but states it is been manageable.  He was seen in outpatient follow-up today and was told to come to the ED for further evaluation.  Patient states that his last dialysis session was yesterday.  Patient states he has not had any fever, vomiting, CP, SOB.   The history is provided by the patient.    Past Medical History:  Diagnosis Date  . Acute cholecystitis 08/17/2018  . Atrial fibrillation ([place])   . CAD (coronary artery disease)   . Cholecystitis   . COPD (chronic obstructive pulmonary disease) ([place])   . Diabetes mellitus without complication ([place])   . ESRD (end stage renal disease) on dialysis ([place])   . Hypertension   . OSA (obstructive sleep apnea)   . Renal disorder   . Spinal stenosis     Patient Active Problem List   Diagnosis Date Noted  . Anastomotic leak of biliary tree 11/23/2018  . Choledocholithiasis 11/11/2018  . ESRD (end stage renal disease) on dialysis ([place]) 08/17/2018  . DM2 (diabetes mellitus, type 2) ([place]) 08/17/2018    . HTN (hypertension) 08/17/2018  . CAD (coronary artery disease) 08/17/2018  . Obesity, Class III, BMI 40-49.9 (morbid obesity) ([place]) 08/17/2018    Past Surgical History:  Procedure Laterality Date  . CHOLECYSTECTOMY N/A 11/14/2018   Procedure: LAPAROSCOPIC CHOLECYSTECTOMY WITH INTRAOPERATIVE CHOLANGIOGRAM;  Surgeon: Coralie Keens, MD;  Location: [facility];  Service: General;  Laterality: N/A;  . HAND SURGERY Right   . IR DIALY SHUNT INTRO NEEDLE/INTRACATH INITIAL W/IMG LEFT Left 08/20/2018  . IR PERC CHOLECYSTOSTOMY  08/19/2018  . IR RADIOLOGIST EVAL & MGMT  09/29/2018  . SHOULDER SURGERY Right   . TIBIA FRACTURE SURGERY          Home Medications    Prior to Admission medications   Medication Sig Start Date End Date Taking? Authorizing Provider  aspirin EC 81 MG tablet Take 81 mg by mouth every other day.    Yes [provider]  atorvastatin (LIPITOR) 20 MG tablet Take 1 tablet (20 mg total) by mouth every evening. Patient taking differently: Take 10 mg by mouth every evening.  08/24/18  Yes Elgergawy, Silver Huguenin, MD  buPROPion (WELLBUTRIN XL) 150 MG 24 hr tablet Take 150 mg by mouth at bedtime.    Yes [provider]  docusate sodium (COLACE) 100 MG capsule Take 100 mg by mouth 2 (two) times daily.    Yes [provider]  escitalopram (LEXAPRO) 10 MG tablet Take 10 mg every evening by mouth. 08/30/15  Yes [provider]  gabapentin (NEURONTIN) 300 MG capsule Take 300 mg by mouth at bedtime.   Yes [provider]  insulin detemir (LEVEMIR) 100 UNIT/ML injection Inject 0.14 mLs (14 Units total) into the skin 2 (two) times daily. Patient taking differently: Inject 16-20 Units into the skin See admin instructions. Sliding scale 20 mg in the morning and 16 in the evening 08/24/18  Yes Elgergawy, Silver Huguenin, MD  Insulin Lispro Prot & Lispro (HUMALOG MIX 75/25 KWIKPEN) (75-25) 100 UNIT/ML Kwikpen Take 10 Units by mouth 2 (two) times daily. 07/12/18   Yes [provider]  lactulose, encephalopathy, (CHRONULAC) 10 GM/15ML SOLN Take 15 mLs by mouth daily before supper.  08/02/18  Yes [provider]  levothyroxine (SYNTHROID, LEVOTHROID) 125 MCG tablet Take 125 mcg daily by mouth.   Yes [provider]  lidocaine-prilocaine (EMLA) cream Apply 1 application topically See admin instructions. To access site (AVF) 1-2 hours before dialysis. Cover with occlusive dressing ([place]aran warp) 08/02/18  Yes [provider]  linaclotide (LINZESS) 290 MCG CAPS capsule Take 290 mcg by mouth daily after supper.    Yes [provider]  loratadine (CLARITIN) 10 MG tablet Take 10 mg every evening by mouth.   Yes [provider]  midodrine (PROAMATINE) 2.5 MG tablet Take 2.5 mg by mouth 3 (three) times daily. 09/20/18  Yes [provider]  montelukast (SINGULAIR) 10 MG tablet Take 10 mg every evening by mouth.   Yes [provider]  sevelamer carbonate (RENVELA) 800 MG tablet Take 3 tablets (2,400 mg total) by mouth 3 (three) times daily with meals. Patient taking differently: Take 2,400 mg by mouth 2 (two) times daily.  08/24/18  Yes Elgergawy, Silver Huguenin, MD    Family History Family History  Problem Relation Age of Onset  . Heart disease Mother   . Heart disease Father   . Heart disease Sister     Social History Social History   Tobacco Use  . Smoking status: Never Smoker  . Smokeless tobacco: Former Network engineer Use Topics  . Alcohol use: Not Currently  . Drug use: No     Allergies   Avelox [moxifloxacin hcl]; Shellfish allergy; Sulfa antibiotics; and Tetracyclines & related   Review of Systems Review of Systems  Constitutional: Negative for fever.  Respiratory: Negative for shortness of breath.   Cardiovascular: Negative for chest pain.  Gastrointestinal: Positive for abdominal pain.  Skin: Positive for wound.  All other systems reviewed and are negative.    Physical  Exam Updated Vital Signs BP (!) 117/55   Pulse 84   Temp 98.7 F (37.1 C) (Oral)   Resp 20   SpO2 97%   Physical Exam Vitals signs and nursing note reviewed.  Constitutional:      Appearance: Normal appearance. He is well-developed.     Comments: Sitting comfortably on examination table  HENT:     Head: Normocephalic and atraumatic.  Eyes:     General: Lids are normal.     Conjunctiva/sclera: Conjunctivae normal.     Pupils: Pupils are equal, round, and reactive to light.  Neck:     Musculoskeletal: Full passive range of motion without pain.  Cardiovascular:     Rate and Rhythm: Normal rate and regular rhythm.     Pulses: Normal pulses.     Heart sounds: Normal heart sounds. No murmur. No friction  rub. No gallop.   Pulmonary:     Effort: Pulmonary effort is normal.     Breath sounds: Normal breath sounds.  Abdominal:     Palpations: Abdomen is soft. Abdomen is not rigid.     Tenderness: There is abdominal tenderness in the right upper quadrant. There is no guarding.       Comments: Biliary drain noted that is draining dark/green fluid.  Healing scattered wounds noted to the abdomen where trochanters were placed.  Abdomen is soft, nondistended.  He has very mild tenderness around the right upper quadrant.  Musculoskeletal: Normal range of motion.  Skin:    General: Skin is warm and dry.     Capillary Refill: Capillary refill takes less than 2 seconds.  Neurological:     Mental Status: He is alert and oriented to person, place, and time.  Psychiatric:        Speech: Speech normal.      ED Treatments / Results  Labs (all labs ordered are listed, but only abnormal results are displayed) Labs Reviewed  COMPREHENSIVE METABOLIC PANEL - Abnormal; Notable for the following components:      Result Value   Chloride 97 (*)    Glucose, Bld 101 (*)    Creatinine, Ser 6.03 (*)    Total Protein 6.4 (*)    Albumin 2.9 (*)    Alkaline Phosphatase 144 (*)    Total Bilirubin  0.2 (*)    GFR calc non Af Amer 8 (*)    GFR calc Af Amer 10 (*)    All other components within normal limits  CBC WITH DIFFERENTIAL/PLATELET - Abnormal; Notable for the following components:   RBC 4.09 (*)    Hemoglobin 11.1 (*)    HCT 37.9 (*)    MCHC 29.3 (*)    RDW 17.6 (*)    All other components within normal limits  LIPASE, BLOOD    EKG None  Radiology Nm Hepatobiliary Including Gb  Result Date: 11/23/2018 CLINICAL DATA:  77 year old male with chronic cholecystitis percutaneous cholecystostomy earlier this year but then status post laparoscopic cholecystectomy on 11/14/2018 with a 19 [place] Blake drain placed into the gallbladder fossa at the time of surgery. Increasing black and greenish fluid from the drain over the past 2 days. EXAM: NUCLEAR MEDICINE HEPATOBILIARY IMAGING TECHNIQUE: Sequential images of the abdomen were obtained out to 60 minutes following intravenous administration of radiopharmaceutical. RADIOPHARMACEUTICALS:  Five mCi Tc-2m Choletec IV COMPARISON:  CT Abdomen and Pelvis 11/11/2018 FINDINGS: Prompt radiotracer uptake by the liver and clearance of the blood pool. Prompt visualization of the CBD by 10 minutes. Normal appearing CBD, duodenum, and progressive small bowel activity between ten and 40 minutes into the exam. No abnormal radiotracer accumulation identified on those images. However, abruptly beginning on the 45 minute image there is abundant radiotracer activity within the percutaneous drain (image 52) which mildly increases until the study was concluded at 55 minutes. IMPRESSION: Positive for biliary leak: Abundant radiotracer activity within the percutaneous drain beginning at the 45 minutes mark of this exam. This was discussed by telephone with PA Lariah Fleer in the ED on 11/23/2018 at 16:19 . Normal appearing CBD and small bowel activity. Electronically Signed   By: HGenevie AnnM.D.   On: 11/23/2018 16:20    Procedures Procedures (including critical  care time)  Medications Ordered in ED Medications  technetium TC 72M mebrofenin (CHOLETEC) injection 5 millicurie (5 millicuries Intravenous Contrast Given 11/23/18 1315)  Initial Impression / Assessment and Plan / ED Course  I have reviewed the triage vital signs and the nursing notes.  Pertinent labs & imaging results that were available during my care of the patient were reviewed by me and considered in my medical decision making (see chart for details).     77 year old male status post cholecystectomy on 11/14/18 seen at outpatient follow-up this morning was told to come to the ED for continued drainage of his biliary drain.  Has some mild abdominal pain.  No fevers, vomiting. Patient is afebrile, non-toxic appearing, sitting comfortably on examination table. Vital signs reviewed and stable.  Plan for basic labs.  Will consult GEN surg.  CBC shows no leukocytosis.  Hemoglobin 11.9, hematocrit 37.9. CMP shows BUN is 21.  Creatinine is 6.03.  AST/ALT is unremarkable.  Alk phos is slightly elevated at 144.  Total bili is 0.2. Lipase is unremarkable.  Alk phos is decreased from previous during hospital admission.  Re-paged Gen Surg.   Discussed patient with Dr. Brantley Stage (Gen Surg).  He would like for [place] to obtain a HIDA scan while patient is here in the ED.  He recommends that if HIDA scan is negative and shows no leak, he can go home with drain and follow-up.  If HIDA scan is positive, recommend reconsulting.  Discussed patient with Dr. Nevada Crane (Radiology).  Patient's HIDA scan is positive for biliary leak.  Discussed patient with Dr. Brantley Stage (Gen Surg).  He will come evaluate patient in the ED.  He is currently in the OR.  He does state that patient can eat as he will most likely have an ERCP done tomorrow.  Discussed patient with Dr. Brantley Stage (Gen Surg) after evaluation in the ED. he will plan for consultation with plans for ERCP either tomorrow or Thursday.  Patient should be n.p.o.  after midnight.  He did consult with GI and nephrology.  They will see him in consultation.  Nephrology will plan to dialyze as needed.  He does request medicine admission given patient's medical history.  Consult to hospitalist placed.   Discussed patient with Dr. Melvia Heaps (hospitalist). Will admit.   Final Clinical Impressions(s) / ED Diagnoses   Final diagnoses:  Biliary anastomotic leak    ED Discharge Orders    None       Desma Mcgregor 11/23/18 1926    Tegeler, Gwenyth Allegra, MD 11/26/18 [phone]

## 2018-11-24 DIAGNOSIS — E1122 Type 2 diabetes mellitus with diabetic chronic kidney disease: Secondary | ICD-10-CM | POA: Diagnosis not present

## 2018-11-24 DIAGNOSIS — E1165 Type 2 diabetes mellitus with hyperglycemia: Secondary | ICD-10-CM

## 2018-11-24 DIAGNOSIS — I2510 Atherosclerotic heart disease of native coronary artery without angina pectoris: Secondary | ICD-10-CM

## 2018-11-24 DIAGNOSIS — I10 Essential (primary) hypertension: Secondary | ICD-10-CM

## 2018-11-24 DIAGNOSIS — Z992 Dependence on renal dialysis: Secondary | ICD-10-CM | POA: Diagnosis not present

## 2018-11-24 DIAGNOSIS — K838 Other specified diseases of biliary tract: Secondary | ICD-10-CM

## 2018-11-24 DIAGNOSIS — N186 End stage renal disease: Secondary | ICD-10-CM | POA: Diagnosis not present

## 2018-11-24 DIAGNOSIS — Z794 Long term (current) use of insulin: Secondary | ICD-10-CM

## 2018-11-24 LAB — CBC
HCT: 34.1 % — ABNORMAL LOW (ref 39.0–52.0)
Hemoglobin: 10.5 g/dL — ABNORMAL LOW (ref 13.0–17.0)
MCH: 28.3 pg (ref 26.0–34.0)
MCHC: 30.8 g/dL (ref 30.0–36.0)
MCV: 91.9 fL (ref 80.0–100.0)
Platelets: 407 10*3/uL — ABNORMAL HIGH (ref 150–400)
RBC: 3.71 MIL/uL — ABNORMAL LOW (ref 4.22–5.81)
RDW: 17.6 % — ABNORMAL HIGH (ref 11.5–15.5)
WBC: 8.3 10*3/uL (ref 4.0–10.5)
nRBC: 0 % (ref 0.0–0.2)

## 2018-11-24 LAB — GLUCOSE, CAPILLARY
Glucose-Capillary: 131 mg/dL — ABNORMAL HIGH (ref 70–99)
Glucose-Capillary: 131 mg/dL — ABNORMAL HIGH (ref 70–99)
Glucose-Capillary: 99 mg/dL (ref 70–99)
Glucose-Capillary: 97 mg/dL (ref 70–99)
Glucose-Capillary: 103 mg/dL — ABNORMAL HIGH (ref 70–99)

## 2018-11-24 LAB — COMPREHENSIVE METABOLIC PANEL
ALT: 13 U/L (ref 0–44)
AST: 14 U/L — ABNORMAL LOW (ref 15–41)
Albumin: 2.9 g/dL — ABNORMAL LOW (ref 3.5–5.0)
Alkaline Phosphatase: 129 U/L — ABNORMAL HIGH (ref 38–126)
Anion gap: 14 (ref 5–15)
BUN: 30 mg/dL — ABNORMAL HIGH (ref 8–23)
CO2: 28 mmol/L (ref 22–32)
Calcium: 9.1 mg/dL (ref 8.9–10.3)
Chloride: 98 mmol/L (ref 98–111)
Creatinine, Ser: 7.07 mg/dL — ABNORMAL HIGH (ref 0.61–1.24)
GFR calc Af Amer: 8 mL/min — ABNORMAL LOW (ref >60)
GFR calc non Af Amer: 7 mL/min — ABNORMAL LOW (ref >60)
Glucose, Bld: 156 mg/dL — ABNORMAL HIGH (ref 70–99)
Potassium: 3.6 mmol/L (ref 3.5–5.1)
Sodium: 140 mmol/L (ref 135–145)
Total Bilirubin: 0.5 mg/dL (ref 0.3–1.2)
Total Protein: 6 g/dL — ABNORMAL LOW (ref 6.5–8.1)

## 2018-11-24 LAB — PHOSPHORUS: Phosphorus: 4.4 mg/dL (ref 2.5–4.6)

## 2018-11-24 LAB — MAGNESIUM: Magnesium: 2.2 mg/dL (ref 1.7–2.4)

## 2018-11-24 MED ORDER — PIPERACILLIN-TAZOBACTAM 3.375 G IVPB
3.3750 g | INTRAVENOUS | Status: AC
  Administered 2018-11-25: 3.375 g via INTRAVENOUS
  Filled 2018-11-24 (×3): qty 50

## 2018-11-24 MED ORDER — CHLORHEXIDINE GLUCONATE CLOTH 2 % EX PADS
6.0000 | MEDICATED_PAD | Freq: Every day | CUTANEOUS | Status: DC
  Administered 2018-11-26: 6 via TOPICAL

## 2018-11-24 MED ORDER — INSULIN DETEMIR 100 UNIT/ML ~~LOC~~ SOLN
8.0000 [IU] | Freq: Two times a day (BID) | SUBCUTANEOUS | Status: DC
  Administered 2018-11-25 – 2018-11-26 (×3): 8 [IU] via SUBCUTANEOUS
  Filled 2018-11-24 (×5): qty 0.08

## 2018-11-24 NOTE — Progress Notes (Signed)
Patient ID: Christian Dalton, male   DOB: January 16, 1941, 78 y.o.   MRN: [id number]       Subjective: No new complaints.  Minimal abdominal pain.  hungry  Objective: Vital signs in last 24 hours: Temp:  [97.9 F (36.6 C)-98.7 F (37.1 C)] 97.9 F (36.6 C) (01/01 0441) Pulse Rate:  [83-96] 85 (01/01 0441) Resp:  [18-20] 18 (01/01 0441) BP: (115-135)/(55-74) 134/74 (01/01 0441) SpO2:  [95 %-100 %] 98 % (01/01 0441) Weight:  [119.1 kg] 119.1 kg (12/31 2017) Last BM Date: 11/23/18  Intake/Output from previous day: 12/31 0701 - 01/01 0700 In: 267.2 [P.O.:180; I.V.:87.2] Out: 120 [Urine:100; Drains:20] Intake/Output this shift: No intake/output data recorded.  PE: Abd: soft, essentially NT, obese, incisions c/d/i.  JP drain with bilious output  Lab Results:  Recent Labs    11/23/18 1231 11/24/18 0219  WBC 7.7 8.3  HGB 11.1* 10.5*  HCT 37.9* 34.1*  PLT 395 407*   BMET Recent Labs    11/23/18 1231 11/24/18 0219  NA 139 140  K 3.7 3.6  CL 97* 98  CO2 31 28  GLUCOSE 101* 156*  BUN 21 30*  CREATININE 6.03* 7.07*  CALCIUM 9.3 9.1   PT/INR Recent Labs    11/23/18 2248  LABPROT 13.8  INR 1.07   CMP     Component Value Date/Time   NA 140 11/24/2018 0219   K 3.6 11/24/2018 0219   CL 98 11/24/2018 0219   CO2 28 11/24/2018 0219   GLUCOSE 156 (H) 11/24/2018 0219   BUN 30 (H) 11/24/2018 0219   CREATININE 7.07 (H) 11/24/2018 0219   CALCIUM 9.1 11/24/2018 0219   PROT 6.0 (L) 11/24/2018 0219   ALBUMIN 2.9 (L) 11/24/2018 0219   AST 14 (L) 11/24/2018 0219   ALT 13 11/24/2018 0219   ALKPHOS 129 (H) 11/24/2018 0219   BILITOT 0.5 11/24/2018 0219   GFRNONAA 7 (L) 11/24/2018 0219   GFRAA 8 (L) 11/24/2018 0219   Lipase     Component Value Date/Time   LIPASE 21 11/23/2018 1231       Studies/Results: Nm Hepatobiliary Including Gb  Result Date: 11/23/2018 CLINICAL DATA:  78 year old male with chronic cholecystitis percutaneous cholecystostomy earlier this year but  then status post laparoscopic cholecystectomy on 11/14/2018 with a 19 [place] Blake drain placed into the gallbladder fossa at the time of surgery. Increasing black and greenish fluid from the drain over the past 2 days. EXAM: NUCLEAR MEDICINE HEPATOBILIARY IMAGING TECHNIQUE: Sequential images of the abdomen were obtained out to 60 minutes following intravenous administration of radiopharmaceutical. RADIOPHARMACEUTICALS:  Five mCi Tc-48m  Choletec IV COMPARISON:  CT Abdomen and Pelvis 11/11/2018 FINDINGS: Prompt radiotracer uptake by the liver and clearance of the blood pool. Prompt visualization of the CBD by 10 minutes. Normal appearing CBD, duodenum, and progressive small bowel activity between ten and 40 minutes into the exam. No abnormal radiotracer accumulation identified on those images. However, abruptly beginning on the 45 minute image there is abundant radiotracer activity within the percutaneous drain (image 52) which mildly increases until the study was concluded at 55 minutes. IMPRESSION: Positive for biliary leak: Abundant radiotracer activity within the percutaneous drain beginning at the 45 minutes mark of this exam. This was discussed by telephone with PA LINDSEY LAYDEN in the ED on 11/23/2018 at 16:19 . Normal appearing CBD and small bowel activity. Electronically Signed   By: Genevie Ann M.D.   On: 11/23/2018 16:20    Anti-infectives: Anti-infectives (From admission, onward)  None       Assessment/Plan Postoperative bile leak status post laparoscopic cholecystectomy for severe chronic cholecystitis  -GI consult pending for ERCP -NPO for possible procedure.  Ok to eat from our standpoint either after procedure or if no procedure planned for today.  ESRD - per renal HTN DM COPD CAD A fib  FEN - NPO VTE - heparin ID - none currently  LOS: 1 day    Henreitta Cea , [facility] 11/24/2018, 9:50 AM Pager: [phone]

## 2018-11-24 NOTE — H&P (View-Only) (Signed)
Referring Provider: Dr.  Brantley Stage  Primary Care Physician:  Street, Sharon Mt, MD Primary Gastroenterologist:  Dr.  Alessandra Bevels  Reason for Consultation: Bile leak  HPI: Christian Dalton is a 78 y.o. male with past medical history of end-stage renal disease on hemodialysis, history of coronary artery disease and COPD presented to the hospital with increased JP drain.  Patient had multiple recent hospitalization for abnormal LFTs and cholecystitis.  He had IR guided cholecystostomy tube placed 3 months ago.  He underwent laparoscopic cholecystectomy 8 days ago for chronic cholecystitis.  He was doing fine until 3 days ago when he started noticing increased drainage in his JP bulb.  Was seen by Dr. Dema Severin in the clinic and was advised to come to the hospital for further evaluation.  HIDA scan confirmed bile leak.  Patient denies any worsening abdominal pain.  He is afebrile.  Denies nausea and vomiting.  Denies any blood in the stool or black stool.   Past Medical History:  Diagnosis Date  . Acute cholecystitis 08/17/2018  . Atrial fibrillation ([place])   . CAD (coronary artery disease)   . Cholecystitis   . COPD (chronic obstructive pulmonary disease) ([place])   . Diabetes mellitus without complication ([place])   . ESRD (end stage renal disease) on dialysis ([place])   . Hypertension   . OSA (obstructive sleep apnea)   . Renal disorder   . Spinal stenosis     Past Surgical History:  Procedure Laterality Date  . CHOLECYSTECTOMY N/A 11/14/2018   Procedure: LAPAROSCOPIC CHOLECYSTECTOMY WITH INTRAOPERATIVE CHOLANGIOGRAM;  Surgeon: Coralie Keens, MD;  Location: [facility];  Service: General;  Laterality: N/A;  . HAND SURGERY Right   . IR DIALY SHUNT INTRO NEEDLE/INTRACATH INITIAL W/IMG LEFT Left 08/20/2018  . IR PERC CHOLECYSTOSTOMY  08/19/2018  . IR RADIOLOGIST EVAL & MGMT  09/29/2018  . SHOULDER SURGERY Right   . TIBIA FRACTURE SURGERY      Prior to Admission medications   Medication Sig Start Date  End Date Taking? Authorizing Provider  aspirin EC 81 MG tablet Take 81 mg by mouth every other day.    Yes [provider]  atorvastatin (LIPITOR) 20 MG tablet Take 1 tablet (20 mg total) by mouth every evening. Patient taking differently: Take 10 mg by mouth every evening.  08/24/18  Yes Elgergawy, Silver Huguenin, MD  buPROPion (WELLBUTRIN XL) 150 MG 24 hr tablet Take 150 mg by mouth at bedtime.    Yes [provider]  docusate sodium (COLACE) 100 MG capsule Take 100 mg by mouth 2 (two) times daily.    Yes [provider]  escitalopram (LEXAPRO) 10 MG tablet Take 10 mg every evening by mouth. 08/30/15  Yes [provider]  gabapentin (NEURONTIN) 300 MG capsule Take 300 mg by mouth at bedtime.   Yes [provider]  insulin detemir (LEVEMIR) 100 UNIT/ML injection Inject 0.14 mLs (14 Units total) into the skin 2 (two) times daily. Patient taking differently: Inject 16-20 Units into the skin See admin instructions. Sliding scale 20 mg in the morning and 16 in the evening 08/24/18  Yes Elgergawy, Silver Huguenin, MD  Insulin Lispro Prot & Lispro (HUMALOG MIX 75/25 KWIKPEN) (75-25) 100 UNIT/ML Kwikpen Take 10 Units by mouth 2 (two) times daily. 07/12/18  Yes [provider]  lactulose, encephalopathy, (CHRONULAC) 10 GM/15ML SOLN Take 15 mLs by mouth daily before supper.  08/02/18  Yes [provider]  levothyroxine (SYNTHROID, LEVOTHROID) 125 MCG tablet Take 125 mcg daily by  mouth.   Yes [provider]  lidocaine-prilocaine (EMLA) cream Apply 1 application topically See admin instructions. To access site (AVF) 1-2 hours before dialysis. Cover with occlusive dressing (Saran warp) 08/02/18  Yes [provider]  linaclotide (LINZESS) 290 MCG CAPS capsule Take 290 mcg by mouth daily after supper.    Yes [provider]  loratadine (CLARITIN) 10 MG tablet Take 10 mg every evening by mouth.   Yes [provider]  midodrine  (PROAMATINE) 2.5 MG tablet Take 2.5 mg by mouth 3 (three) times daily. 09/20/18  Yes [provider]  montelukast (SINGULAIR) 10 MG tablet Take 10 mg every evening by mouth.   Yes [provider]  sevelamer carbonate (RENVELA) 800 MG tablet Take 3 tablets (2,400 mg total) by mouth 3 (three) times daily with meals. Patient taking differently: Take 2,400 mg by mouth 2 (two) times daily.  08/24/18  Yes Elgergawy, Silver Huguenin, MD    Scheduled Meds: . atorvastatin  20 mg Oral QPM  . buPROPion  150 mg Oral QHS  . Chlorhexidine Gluconate Cloth  6 each Topical Q0600  . docusate sodium  100 mg Oral BID  . enoxaparin (LOVENOX) injection  30 mg Subcutaneous Q24H  . escitalopram  10 mg Oral QPM  . gabapentin  300 mg Oral QHS  . insulin aspart  0-15 Units Subcutaneous TID WC  . insulin detemir  14 Units Subcutaneous BID  . lactulose  10 g Oral QAC supper  . levothyroxine  125 mcg Oral Q0600  . linaclotide  290 mcg Oral QPC supper  . loratadine  10 mg Oral QPM  . midodrine  2.5 mg Oral TID WC  . montelukast  10 mg Oral QPM  . sevelamer carbonate  2,400 mg Oral BID WC   Continuous Infusions: . sodium chloride 10 mL/hr at 11/24/18 0700   PRN Meds:.acetaminophen **OR** acetaminophen, fentaNYL (SUBLIMAZE) injection, ondansetron **OR** ondansetron (ZOFRAN) IV, ondansetron **OR** ondansetron (ZOFRAN) IV, oxyCODONE  Allergies as of 11/23/2018 - Review Complete 11/23/2018  Allergen Reaction Noted  . Avelox [moxifloxacin hcl] Other (See Comments) 10/05/2017  . Shellfish allergy Nausea And Vomiting 11/12/2018  . Sulfa antibiotics Other (See Comments) 05/16/2016  . Tetracyclines & related Other (See Comments) 05/16/2016    Family History  Problem Relation Age of Onset  . Heart disease Mother   . Heart disease Father   . Heart disease Sister     Social History   Socioeconomic History  . Marital status: Married    Spouse name: Di Kindle  . Number of children: Not on file  . Years of  education: Not on file  . Highest education level: Not on file  Occupational History  . Not on file  Social Needs  . Financial resource strain: Not hard at all  . Food insecurity:    Worry: Patient refused    Inability: Patient refused  . Transportation needs:    Medical: Patient refused    Non-medical: Patient refused  Tobacco Use  . Smoking status: Never Smoker  . Smokeless tobacco: Former Network engineer and Sexual Activity  . Alcohol use: Not Currently  . Drug use: No  . Sexual activity: Not on file  Lifestyle  . Physical activity:    Days per week: Patient refused    Minutes per session: Patient refused  . Stress: Only a little  Relationships  . Social connections:    Talks on phone: Not on file    Gets together: Not on file  Attends religious service: Not on file    Active member of club or organization: Not on file    Attends meetings of clubs or organizations: Not on file    Relationship status: Not on file  . Intimate partner violence:    Fear of current or ex partner: Not on file    Emotionally abused: Not on file    Physically abused: Not on file    Forced sexual activity: Not on file  Other Topics Concern  . Not on file  Social History Narrative  . Not on file    Review of Systems: Review of Systems  Constitutional: Negative for chills and fever.  HENT: Negative for hearing loss and tinnitus.   Eyes: Negative for blurred vision and double vision.  Respiratory: Negative for cough and sputum production.   Cardiovascular: Negative for chest pain and palpitations.  Gastrointestinal: Negative for abdominal pain, blood in stool, constipation, diarrhea, heartburn, melena, nausea and vomiting.  Genitourinary: Negative for dysuria.  Musculoskeletal: Positive for back pain. Negative for myalgias.  Skin: Negative for itching and rash.  Neurological: Negative for seizures and loss of consciousness.  Endo/Heme/Allergies: Does not bruise/bleed easily.   Psychiatric/Behavioral: Negative for hallucinations and suicidal ideas.    Physical Exam: Vital signs: Vitals:   11/23/18 2017 11/24/18 0441  BP: 135/67 134/74  Pulse: 83 85  Resp: 19 18  Temp: 98.4 F (36.9 C) 97.9 F (36.6 C)  SpO2: 95% 98%   Last BM Date: 11/23/18 Physical Exam  Constitutional: He is oriented to person, place, and time. He appears well-developed and well-nourished. No distress.  HENT:  Head: Normocephalic and atraumatic.  Mouth/Throat: Oropharynx is clear and moist. No oropharyngeal exudate.  Eyes: EOM are normal. No scleral icterus.  Neck: Normal range of motion. Neck supple.  Cardiovascular: Normal rate, regular rhythm and normal heart sounds.  Pulmonary/Chest: Effort normal and breath sounds normal. No respiratory distress.  Abdominal: Soft. Bowel sounds are normal. He exhibits distension. There is no abdominal tenderness. There is no rebound and no guarding.  Well-healed scar marks from recent surgery noted.  Musculoskeletal: Normal range of motion.        General: No edema.  Neurological: He is alert and oriented to person, place, and time.  Skin: Skin is warm. No erythema.  Psychiatric: He has a normal mood and affect. Judgment and thought content normal.  Vitals reviewed.   GI:  Lab Results: Recent Labs    11/23/18 1231 11/24/18 0219  WBC 7.7 8.3  HGB 11.1* 10.5*  HCT 37.9* 34.1*  PLT 395 407*   BMET Recent Labs    11/23/18 1231 11/24/18 0219  NA 139 140  K 3.7 3.6  CL 97* 98  CO2 31 28  GLUCOSE 101* 156*  BUN 21 30*  CREATININE 6.03* 7.07*  CALCIUM 9.3 9.1   LFT Recent Labs    11/24/18 0219  PROT 6.0*  ALBUMIN 2.9*  AST 14*  ALT 13  ALKPHOS 129*  BILITOT 0.5   PT/INR Recent Labs    11/23/18 2248  LABPROT 13.8  INR 1.07     Studies/Results: Nm Hepatobiliary Including Gb  Result Date: 11/23/2018 CLINICAL DATA:  78 year old male with chronic cholecystitis percutaneous cholecystostomy earlier this year but  then status post laparoscopic cholecystectomy on 11/14/2018 with a 19 [place] Blake drain placed into the gallbladder fossa at the time of surgery. Increasing black and greenish fluid from the drain over the past 2 days. EXAM: NUCLEAR MEDICINE HEPATOBILIARY IMAGING TECHNIQUE:  Sequential images of the abdomen were obtained out to 60 minutes following intravenous administration of radiopharmaceutical. RADIOPHARMACEUTICALS:  Five mCi Tc-38m  Choletec IV COMPARISON:  CT Abdomen and Pelvis 11/11/2018 FINDINGS: Prompt radiotracer uptake by the liver and clearance of the blood pool. Prompt visualization of the CBD by 10 minutes. Normal appearing CBD, duodenum, and progressive small bowel activity between ten and 40 minutes into the exam. No abnormal radiotracer accumulation identified on those images. However, abruptly beginning on the 45 minute image there is abundant radiotracer activity within the percutaneous drain (image 52) which mildly increases until the study was concluded at 55 minutes. IMPRESSION: Positive for biliary leak: Abundant radiotracer activity within the percutaneous drain beginning at the 45 minutes mark of this exam. This was discussed by telephone with PA LINDSEY LAYDEN in the ED on 11/23/2018 at 16:19 . Normal appearing CBD and small bowel activity. Electronically Signed   By: Genevie Ann M.D.   On: 11/23/2018 16:20    Impression/Plan: -Bile leak status post laparoscopic cholecystectomy for chronic cholecystitis.  Patient is afebrile.  Normal bilirubin.  Normal white counts. -End-stage renal disease on hemodialysis. - H/O coronary artery disease and history of COPD.  Recommendations ------------------------- -Okay to have regular diet today. -Tentative plan for ERCP tomorrow.  Keep n.p.o. past midnight  Risks (bleeding, infection, post ERCP pancreatitis, bowel perforation that could require surgery, sedation-related changes in cardiopulmonary systems), benefits (identification and possible  treatment of source of symptoms, exclusion of certain causes of symptoms), and alternatives (watchful waiting, radiographic imaging studies, empiric medical treatment)  were explained to patient in detail and patient wishes to proceed.    LOS: 1 day   Otis Brace  MD, FACP 11/24/2018, 11:18 AM  Contact #  [phone]

## 2018-11-24 NOTE — Progress Notes (Signed)
Patient refused CPAP for tonight 

## 2018-11-24 NOTE — Progress Notes (Addendum)
PROGRESS NOTE    Christian Dalton   NTI:[id number]  DOB: October 03, 1941  DOA: 11/23/2018 PCP: Venetia Maxon, Sharon Mt, MD   Brief Narrative:  Christian Dalton  a 78 y.o. male with medical history significant for ESRD on TTS HD via LUE AVF, CAD, hypothyroidism, diabetes mellitus type 2, hypertension, undocumented COPD, OSA on CPAP and recent ascending cholangitis status post cholecystostomy drain 93 months ago) followed by laparoscopic cholecystectomy on 12/22 who presented to the ED from surgery clinic with 3 to 4 days of mild abdominal pain and increased greenish drainage from his biliary drain.  HIDA scan demonstrated the presence of a biliary leak.    Subjective: No abdominal pain today.  ROS: no complaints of nausea, vomiting, constipation diarrhea, cough, dyspnea or dysuria. No other complaints.   Assessment & Plan:   Active Problems:   Bile leak, postoperative - large amount of bile in drain- he has been draining it about 3 x day at home - gen surgery and [facility] following- plan for ERCP tomorrow   ESRD on HD, 2ndary hyper para, anemia of CKD - TTS dialysis - per nephrology  DM2 - on Levemir and  SSI- reduce Levemir to 8U BID as he will be NPO overnight for a procedure tomorrow  Chronic hypotension - cont midodrine  Hypothyroid - cont Synthroid  Depression - cont Lexapro and Wellbutrin  IBS with constipation - Linzess  OSA - start CPAP at bedtime  CAD - on ASA, Lipitor  DVT prophylaxis: Lovenox Code Status: Full code Family Communication:  Disposition Plan: follow on med surg Consultants:   gen surgery  [facility] Procedures:   none Antimicrobials:  Anti-infectives (From admission, onward)   Start     Dose/Rate Route Frequency Ordered Stop   11/25/18 1200  piperacillin-tazobactam (ZOSYN) IVPB 3.375 g     3.375 g 12.5 mL/hr over 240 Minutes Intravenous 30 min pre-op 11/24/18 1425         Objective: Vitals:   11/23/18 1745 11/23/18 1810 11/23/18 2017  11/24/18 0441  BP: (!) 117/55  135/67 134/74  Pulse:  84 83 85  Resp:   19 18  Temp:   98.4 F (36.9 C) 97.9 F (36.6 C)  TempSrc:   Oral Oral  SpO2:  97% 95% 98%  Weight:   119.1 kg   Height:   5\' 10"  (1.778 m)     Intake/Output Summary (Last 24 hours) at 11/24/2018 1503 Last data filed at 11/24/2018 0700 Gross per 24 hour  Intake 267.22 ml  Output 120 ml  Net 147.22 ml   Filed Weights   11/23/18 2017  Weight: 119.1 kg    Examination: General exam: Appears comfortable  HEENT: PERRLA, oral mucosa moist, no sclera icterus or thrush Respiratory system: Clear to auscultation. Respiratory effort normal. Cardiovascular system: S1 & S2 heard, RRR.   Gastrointestinal system: Abdomen soft, non-tender, nondistended. Normal bowel sounds.- RUQ drain with large amount of greenish liquid Central nervous system: Alert and oriented. No focal neurological deficits. Extremities: No cyanosis, clubbing or edema Skin: No rashes or ulcers Psychiatry:  Mood & affect appropriate.     Data Reviewed: I have personally reviewed following labs and imaging studies  CBC: Recent Labs  Lab 11/23/18 1231 11/24/18 0219  WBC 7.7 8.3  NEUTROABS 5.3  --   HGB 11.1* 10.5*  HCT 37.9* 34.1*  MCV 92.7 91.9  PLT 395 867*   Basic Metabolic Panel: Recent Labs  Lab 11/23/18 1231 11/23/18 2248 11/24/18 0219  NA  139  --  140  K 3.7  --  3.6  CL 97*  --  98  CO2 31  --  28  GLUCOSE 101*  --  156*  BUN 21  --  30*  CREATININE 6.03*  --  7.07*  CALCIUM 9.3  --  9.1  MG  --  2.1 2.2  PHOS  --  4.1 4.4   GFR: Estimated Creatinine Clearance: 11.7 mL/min (A) (by C-G formula based on SCr of 7.07 mg/dL (H)). Liver Function Tests: Recent Labs  Lab 11/23/18 1231 11/24/18 0219  AST 17 14*  ALT 13 13  ALKPHOS 144* 129*  BILITOT 0.2* 0.5  PROT 6.4* 6.0*  ALBUMIN 2.9* 2.9*   Recent Labs  Lab 11/23/18 1231  LIPASE 21   No results for input(s): AMMONIA in the last 168 hours. Coagulation  Profile: Recent Labs  Lab 11/23/18 2248  INR 1.07   Cardiac Enzymes: No results for input(s): CKTOTAL, CKMB, CKMBINDEX, TROPONINI in the last 168 hours. BNP (last 3 results) No results for input(s): PROBNP in the last 8760 hours. HbA1C: No results for input(s): HGBA1C in the last 72 hours. CBG: Recent Labs  Lab 11/23/18 2136 11/24/18 0437 11/24/18 0906 11/24/18 1238  GLUCAP 182* 131* 131* 99   Lipid Profile: No results for input(s): CHOL, HDL, LDLCALC, TRIG, CHOLHDL, LDLDIRECT in the last 72 hours. Thyroid Function Tests: No results for input(s): TSH, T4TOTAL, FREET4, T3FREE, THYROIDAB in the last 72 hours. Anemia Panel: No results for input(s): VITAMINB12, FOLATE, FERRITIN, TIBC, IRON, RETICCTPCT in the last 72 hours. Urine analysis: No results found for: COLORURINE, APPEARANCEUR, LABSPEC, PHURINE, GLUCOSEU, HGBUR, BILIRUBINUR, KETONESUR, PROTEINUR, UROBILINOGEN, NITRITE, LEUKOCYTESUR Sepsis Labs: @LABRCNTIP (procalcitonin:4,lacticidven:4) )No results found for this or any previous visit (from the past 240 hour(s)).       Radiology Studies: Nm Hepatobiliary Including Gb  Result Date: 11/23/2018 CLINICAL DATA:  78 year old male with chronic cholecystitis percutaneous cholecystostomy earlier this year but then status post laparoscopic cholecystectomy on 11/14/2018 with a 19 [place] Blake drain placed into the gallbladder fossa at the time of surgery. Increasing black and greenish fluid from the drain over the past 2 days. EXAM: NUCLEAR MEDICINE HEPATOBILIARY IMAGING TECHNIQUE: Sequential images of the abdomen were obtained out to 60 minutes following intravenous administration of radiopharmaceutical. RADIOPHARMACEUTICALS:  Five mCi Tc-51m  Choletec IV COMPARISON:  CT Abdomen and Pelvis 11/11/2018 FINDINGS: Prompt radiotracer uptake by the liver and clearance of the blood pool. Prompt visualization of the CBD by 10 minutes. Normal appearing CBD, duodenum, and progressive small  bowel activity between ten and 40 minutes into the exam. No abnormal radiotracer accumulation identified on those images. However, abruptly beginning on the 45 minute image there is abundant radiotracer activity within the percutaneous drain (image 52) which mildly increases until the study was concluded at 55 minutes. IMPRESSION: Positive for biliary leak: Abundant radiotracer activity within the percutaneous drain beginning at the 45 minutes mark of this exam. This was discussed by telephone with PA LINDSEY LAYDEN in the ED on 11/23/2018 at 16:19 . Normal appearing CBD and small bowel activity. Electronically Signed   By: Genevie Ann M.D.   On: 11/23/2018 16:20      Scheduled Meds: . atorvastatin  20 mg Oral QPM  . buPROPion  150 mg Oral QHS  . Chlorhexidine Gluconate Cloth  6 each Topical Q0600  . docusate sodium  100 mg Oral BID  . enoxaparin (LOVENOX) injection  30 mg Subcutaneous Q24H  . escitalopram  10 mg Oral QPM  . gabapentin  300 mg Oral QHS  . insulin aspart  0-15 Units Subcutaneous TID WC  . insulin detemir  14 Units Subcutaneous BID  . lactulose  10 g Oral QAC supper  . levothyroxine  125 mcg Oral Q0600  . linaclotide  290 mcg Oral QPC supper  . loratadine  10 mg Oral QPM  . midodrine  2.5 mg Oral TID WC  . montelukast  10 mg Oral QPM  . sevelamer carbonate  2,400 mg Oral BID WC   Continuous Infusions: . sodium chloride 10 mL/hr at 11/24/18 0700  . [START ON 11/25/2018] piperacillin-tazobactam (ZOSYN)  IV       LOS: 1 day    Time spent in minutes: 35- reviewed prior notes, dicussed plan with GI    Debbe Odea, MD [facility] Pager: [email or web address] Password [email or web address] 11/24/2018, 3:03 PM

## 2018-11-24 NOTE — Consult Note (Signed)
Referring Provider: Dr.  Brantley Stage  Primary Care Physician:  Street, Sharon Mt, MD Primary Gastroenterologist:  Dr.  Alessandra Bevels  Reason for Consultation: Bile leak  HPI: Christian Dalton is a 78 y.o. male with past medical history of end-stage renal disease on hemodialysis, history of coronary artery disease and COPD presented to the hospital with increased JP drain.  Patient had multiple recent hospitalization for abnormal LFTs and cholecystitis.  He had IR guided cholecystostomy tube placed 3 months ago.  He underwent laparoscopic cholecystectomy 8 days ago for chronic cholecystitis.  He was doing fine until 3 days ago when he started noticing increased drainage in his JP bulb.  Was seen by Dr. Dema Severin in the clinic and was advised to come to the hospital for further evaluation.  HIDA scan confirmed bile leak.  Patient denies any worsening abdominal pain.  He is afebrile.  Denies nausea and vomiting.  Denies any blood in the stool or black stool.   Past Medical History:  Diagnosis Date  . Acute cholecystitis 08/17/2018  . Atrial fibrillation ([place])   . CAD (coronary artery disease)   . Cholecystitis   . COPD (chronic obstructive pulmonary disease) ([place])   . Diabetes mellitus without complication ([place])   . ESRD (end stage renal disease) on dialysis ([place])   . Hypertension   . OSA (obstructive sleep apnea)   . Renal disorder   . Spinal stenosis     Past Surgical History:  Procedure Laterality Date  . CHOLECYSTECTOMY N/A 11/14/2018   Procedure: LAPAROSCOPIC CHOLECYSTECTOMY WITH INTRAOPERATIVE CHOLANGIOGRAM;  Surgeon: Coralie Keens, MD;  Location: [facility];  Service: General;  Laterality: N/A;  . HAND SURGERY Right   . IR DIALY SHUNT INTRO NEEDLE/INTRACATH INITIAL W/IMG LEFT Left 08/20/2018  . IR PERC CHOLECYSTOSTOMY  08/19/2018  . IR RADIOLOGIST EVAL & MGMT  09/29/2018  . SHOULDER SURGERY Right   . TIBIA FRACTURE SURGERY      Prior to Admission medications   Medication Sig Start Date  End Date Taking? Authorizing Provider  aspirin EC 81 MG tablet Take 81 mg by mouth every other day.    Yes [provider]  atorvastatin (LIPITOR) 20 MG tablet Take 1 tablet (20 mg total) by mouth every evening. Patient taking differently: Take 10 mg by mouth every evening.  08/24/18  Yes Elgergawy, Silver Huguenin, MD  buPROPion (WELLBUTRIN XL) 150 MG 24 hr tablet Take 150 mg by mouth at bedtime.    Yes [provider]  docusate sodium (COLACE) 100 MG capsule Take 100 mg by mouth 2 (two) times daily.    Yes [provider]  escitalopram (LEXAPRO) 10 MG tablet Take 10 mg every evening by mouth. 08/30/15  Yes [provider]  gabapentin (NEURONTIN) 300 MG capsule Take 300 mg by mouth at bedtime.   Yes [provider]  insulin detemir (LEVEMIR) 100 UNIT/ML injection Inject 0.14 mLs (14 Units total) into the skin 2 (two) times daily. Patient taking differently: Inject 16-20 Units into the skin See admin instructions. Sliding scale 20 mg in the morning and 16 in the evening 08/24/18  Yes Elgergawy, Silver Huguenin, MD  Insulin Lispro Prot & Lispro (HUMALOG MIX 75/25 KWIKPEN) (75-25) 100 UNIT/ML Kwikpen Take 10 Units by mouth 2 (two) times daily. 07/12/18  Yes [provider]  lactulose, encephalopathy, (CHRONULAC) 10 GM/15ML SOLN Take 15 mLs by mouth daily before supper.  08/02/18  Yes [provider]  levothyroxine (SYNTHROID, LEVOTHROID) 125 MCG tablet Take 125 mcg daily by  mouth.   Yes [provider]  lidocaine-prilocaine (EMLA) cream Apply 1 application topically See admin instructions. To access site (AVF) 1-2 hours before dialysis. Cover with occlusive dressing ([place] warp) 08/02/18  Yes [provider]  linaclotide (LINZESS) 290 MCG CAPS capsule Take 290 mcg by mouth daily after supper.    Yes [provider]  loratadine (CLARITIN) 10 MG tablet Take 10 mg every evening by mouth.   Yes [provider]  midodrine  (PROAMATINE) 2.5 MG tablet Take 2.5 mg by mouth 3 (three) times daily. 09/20/18  Yes [provider]  montelukast (SINGULAIR) 10 MG tablet Take 10 mg every evening by mouth.   Yes [provider]  sevelamer carbonate (RENVELA) 800 MG tablet Take 3 tablets (2,400 mg total) by mouth 3 (three) times daily with meals. Patient taking differently: Take 2,400 mg by mouth 2 (two) times daily.  08/24/18  Yes Elgergawy, Silver Huguenin, MD    Scheduled Meds: . atorvastatin  20 mg Oral QPM  . buPROPion  150 mg Oral QHS  . Chlorhexidine Gluconate Cloth  6 each Topical Q0600  . docusate sodium  100 mg Oral BID  . enoxaparin (LOVENOX) injection  30 mg Subcutaneous Q24H  . escitalopram  10 mg Oral QPM  . gabapentin  300 mg Oral QHS  . insulin aspart  0-15 Units Subcutaneous TID WC  . insulin detemir  14 Units Subcutaneous BID  . lactulose  10 g Oral QAC supper  . levothyroxine  125 mcg Oral Q0600  . linaclotide  290 mcg Oral QPC supper  . loratadine  10 mg Oral QPM  . midodrine  2.5 mg Oral TID WC  . montelukast  10 mg Oral QPM  . sevelamer carbonate  2,400 mg Oral BID WC   Continuous Infusions: . sodium chloride 10 mL/hr at 11/24/18 0700   PRN Meds:.acetaminophen **OR** acetaminophen, fentaNYL (SUBLIMAZE) injection, ondansetron **OR** ondansetron (ZOFRAN) IV, ondansetron **OR** ondansetron (ZOFRAN) IV, oxyCODONE  Allergies as of 11/23/2018 - Review Complete 11/23/2018  Allergen Reaction Noted  . Avelox [moxifloxacin hcl] Other (See Comments) 10/05/2017  . Shellfish allergy Nausea And Vomiting 11/12/2018  . Sulfa antibiotics Other (See Comments) 05/16/2016  . Tetracyclines & related Other (See Comments) 05/16/2016    Family History  Problem Relation Age of Onset  . Heart disease Mother   . Heart disease Father   . Heart disease Sister     Social History   Socioeconomic History  . Marital status: Married    Spouse name: Di Kindle  . Number of children: Not on file  . Years of  education: Not on file  . Highest education level: Not on file  Occupational History  . Not on file  Social Needs  . Financial resource strain: Not hard at all  . Food insecurity:    Worry: Patient refused    Inability: Patient refused  . Transportation needs:    Medical: Patient refused    Non-medical: Patient refused  Tobacco Use  . Smoking status: Never Smoker  . Smokeless tobacco: Former Network engineer and Sexual Activity  . Alcohol use: Not Currently  . Drug use: No  . Sexual activity: Not on file  Lifestyle  . Physical activity:    Days per week: Patient refused    Minutes per session: Patient refused  . Stress: Only a little  Relationships  . Social connections:    Talks on phone: Not on file    Gets together: Not on file  Attends religious service: Not on file    Active member of club or organization: Not on file    Attends meetings of clubs or organizations: Not on file    Relationship status: Not on file  . Intimate partner violence:    Fear of current or ex partner: Not on file    Emotionally abused: Not on file    Physically abused: Not on file    Forced sexual activity: Not on file  Other Topics Concern  . Not on file  Social History Narrative  . Not on file    Review of Systems: Review of Systems  Constitutional: Negative for chills and fever.  HENT: Negative for hearing loss and tinnitus.   Eyes: Negative for blurred vision and double vision.  Respiratory: Negative for cough and sputum production.   Cardiovascular: Negative for chest pain and palpitations.  Gastrointestinal: Negative for abdominal pain, blood in stool, constipation, diarrhea, heartburn, melena, nausea and vomiting.  Genitourinary: Negative for dysuria.  Musculoskeletal: Positive for back pain. Negative for myalgias.  Skin: Negative for itching and rash.  Neurological: Negative for seizures and loss of consciousness.  Endo/Heme/Allergies: Does not bruise/bleed easily.   Psychiatric/Behavioral: Negative for hallucinations and suicidal ideas.    Physical Exam: Vital signs: Vitals:   11/23/18 2017 11/24/18 0441  BP: 135/67 134/74  Pulse: 83 85  Resp: 19 18  Temp: 98.4 F (36.9 C) 97.9 F (36.6 C)  SpO2: 95% 98%   Last BM Date: 11/23/18 Physical Exam  Constitutional: He is oriented to person, place, and time. He appears well-developed and well-nourished. No distress.  HENT:  Head: Normocephalic and atraumatic.  Mouth/Throat: Oropharynx is clear and moist. No oropharyngeal exudate.  Eyes: EOM are normal. No scleral icterus.  Neck: Normal range of motion. Neck supple.  Cardiovascular: Normal rate, regular rhythm and normal heart sounds.  Pulmonary/Chest: Effort normal and breath sounds normal. No respiratory distress.  Abdominal: Soft. Bowel sounds are normal. He exhibits distension. There is no abdominal tenderness. There is no rebound and no guarding.  Well-healed scar marks from recent surgery noted.  Musculoskeletal: Normal range of motion.        General: No edema.  Neurological: He is alert and oriented to person, place, and time.  Skin: Skin is warm. No erythema.  Psychiatric: He has a normal mood and affect. Judgment and thought content normal.  Vitals reviewed.   GI:  Lab Results: Recent Labs    11/23/18 1231 11/24/18 0219  WBC 7.7 8.3  HGB 11.1* 10.5*  HCT 37.9* 34.1*  PLT 395 407*   BMET Recent Labs    11/23/18 1231 11/24/18 0219  NA 139 140  K 3.7 3.6  CL 97* 98  CO2 31 28  GLUCOSE 101* 156*  BUN 21 30*  CREATININE 6.03* 7.07*  CALCIUM 9.3 9.1   LFT Recent Labs    11/24/18 0219  PROT 6.0*  ALBUMIN 2.9*  AST 14*  ALT 13  ALKPHOS 129*  BILITOT 0.5   PT/INR Recent Labs    11/23/18 2248  LABPROT 13.8  INR 1.07     Studies/Results: Nm Hepatobiliary Including Gb  Result Date: 11/23/2018 CLINICAL DATA:  78 year old male with chronic cholecystitis percutaneous cholecystostomy earlier this year but  then status post laparoscopic cholecystectomy on 11/14/2018 with a 19 [place] drain placed into the gallbladder fossa at the time of surgery. Increasing black and greenish fluid from the drain over the past 2 days. EXAM: NUCLEAR MEDICINE HEPATOBILIARY IMAGING TECHNIQUE:  Sequential images of the abdomen were obtained out to 60 minutes following intravenous administration of radiopharmaceutical. RADIOPHARMACEUTICALS:  Five mCi Tc-35m  Choletec IV COMPARISON:  CT Abdomen and Pelvis 11/11/2018 FINDINGS: Prompt radiotracer uptake by the liver and clearance of the blood pool. Prompt visualization of the CBD by 10 minutes. Normal appearing CBD, duodenum, and progressive small bowel activity between ten and 40 minutes into the exam. No abnormal radiotracer accumulation identified on those images. However, abruptly beginning on the 45 minute image there is abundant radiotracer activity within the percutaneous drain (image 52) which mildly increases until the study was concluded at 55 minutes. IMPRESSION: Positive for biliary leak: Abundant radiotracer activity within the percutaneous drain beginning at the 45 minutes mark of this exam. This was discussed by telephone with PA LINDSEY LAYDEN in the ED on 11/23/2018 at 16:19 . Normal appearing CBD and small bowel activity. Electronically Signed   By: Genevie Ann M.D.   On: 11/23/2018 16:20    Impression/Plan: -Bile leak status post laparoscopic cholecystectomy for chronic cholecystitis.  Patient is afebrile.  Normal bilirubin.  Normal white counts. -End-stage renal disease on hemodialysis. - H/O coronary artery disease and history of COPD.  Recommendations ------------------------- -Okay to have regular diet today. -Tentative plan for ERCP tomorrow.  Keep n.p.o. past midnight  Risks (bleeding, infection, post ERCP pancreatitis, bowel perforation that could require surgery, sedation-related changes in cardiopulmonary systems), benefits (identification and possible  treatment of source of symptoms, exclusion of certain causes of symptoms), and alternatives (watchful waiting, radiographic imaging studies, empiric medical treatment)  were explained to patient in detail and patient wishes to proceed.    LOS: 1 day   Otis Brace  MD, FACP 11/24/2018, 11:18 AM  Contact #  [phone]

## 2018-11-24 NOTE — Consult Note (Signed)
Reason for Consult: ESRD Referring Physician:  Dr. Otelia Limes  Chief Complaint:  Leak from drain  Dialysis Orders: [facility] 4hr 2/2.25 180NRe     EDW 118 kg Heparin 5000 unit bolus, 2500 mid tx Hb 10.8 11/18/18  Last Mircera 60mcg (11/22/18) Calcitriol 65mcg PO TIW PTH 432 09/16/2018  Assessment/Plan:  1. ESRD - no acute indication for dialysis today with last treatment just yesterday. Will plan on dialysis Thursday depending on when intervention for the biliary leak is scheduled. He is not profoundly volume overloaded with no dyspnea and only 0.5kg above EDW at end of treatment yesterday. 2. Renal osteodystrophy - continue renvela when not NPO, currently on 3 tabs BID w/ meals 3. Anemia - @ goal, no Mircera needed during this hospitalization. Last given 11/22/18. 4. Biliary leak - ERCP today? 5. HTN 6. CASHD - @ baseline, asymp 7. DM    HPI: Christian Dalton is an 78 y.o. male ESRD [facility] last HD Monday per holiday schedule; he signed off early after 3 hrs (usually on for 4 hrs) and was 0.5kg above EDW which has been reached many times over the past few weeks. He has a history of CASHD, DM, HTN, OSA and recent ascending cholangitis s/p cholecystostomy drain with lap choly 12/22  presenting after going to surgery clinic with abd pain and increased green drainage in the biliary drain. HIDA scan demonstrated a biliary leak. He denies f/c/n/v/myagias.   ROS Pertinent items are noted in HPI.  Chemistry and CBC: Creatinine, Ser  Date/Time Value Ref Range Status  11/24/2018 02:19 AM 7.07 (H) 0.61 - 1.24 mg/dL Final  11/23/2018 12:31 PM 6.03 (H) 0.61 - 1.24 mg/dL Final  11/15/2018 05:00 AM 8.89 (H) 0.61 - 1.24 mg/dL Final  11/14/2018 03:48 AM 7.31 (H) 0.61 - 1.24 mg/dL Final  11/13/2018 04:25 AM 8.02 (H) 0.61 - 1.24 mg/dL Final  11/12/2018 04:30 AM 6.39 (H) 0.61 - 1.24 mg/dL Final  11/11/2018 11:16 PM 6.02 (H) 0.61 - 1.24 mg/dL Final  08/24/2018 10:20 AM 9.32 (H) 0.61 - 1.24  mg/dL Final  08/24/2018 08:29 AM 9.50 (H) 0.61 - 1.24 mg/dL Final  08/23/2018 05:16 AM 7.85 (H) 0.61 - 1.24 mg/dL Final  08/22/2018 03:50 AM 6.27 (H) 0.61 - 1.24 mg/dL Final  08/21/2018 03:13 AM 7.98 (H) 0.61 - 1.24 mg/dL Final  08/20/2018 04:19 AM 6.82 (H) 0.61 - 1.24 mg/dL Final  08/19/2018 04:33 AM 9.13 (H) 0.61 - 1.24 mg/dL Final  08/18/2018 02:23 PM 8.36 (H) 0.61 - 1.24 mg/dL Final  08/18/2018 06:05 AM 11.80 (H) 0.61 - 1.24 mg/dL Final  10/05/2017 07:29 PM 7.74 (H) 0.61 - 1.24 mg/dL Final  05/16/2016 11:55 AM 7.80 (H) 0.61 - 1.24 mg/dL Final   Recent Labs  Lab 11/23/18 1231 11/23/18 2248 11/24/18 0219  NA 139  --  140  K 3.7  --  3.6  CL 97*  --  98  CO2 31  --  28  GLUCOSE 101*  --  156*  BUN 21  --  30*  CREATININE 6.03*  --  7.07*  CALCIUM 9.3  --  9.1  PHOS  --  4.1 4.4   Recent Labs  Lab 11/23/18 1231 11/24/18 0219  WBC 7.7 8.3  NEUTROABS 5.3  --   HGB 11.1* 10.5*  HCT 37.9* 34.1*  MCV 92.7 91.9  PLT 395 407*   Liver Function Tests: Recent Labs  Lab 11/23/18 1231 11/24/18 0219  AST 17 14*  ALT 13 13  ALKPHOS  144* 129*  BILITOT 0.2* 0.5  PROT 6.4* 6.0*  ALBUMIN 2.9* 2.9*   Recent Labs  Lab 11/23/18 1231  LIPASE 21   No results for input(s): AMMONIA in the last 168 hours. Cardiac Enzymes: No results for input(s): CKTOTAL, CKMB, CKMBINDEX, TROPONINI in the last 168 hours. Iron Studies: No results for input(s): IRON, TIBC, TRANSFERRIN, FERRITIN in the last 72 hours. PT/INR: @LABRCNTIP (inr:5)  Xrays/Other Studies: ) Results for orders placed or performed during the hospital encounter of 11/23/18 (from the past 48 hour(s))  Comprehensive metabolic panel     Status: Abnormal   Collection Time: 11/23/18 12:31 PM  Result Value Ref Range   Sodium 139 135 - 145 mmol/L   Potassium 3.7 3.5 - 5.1 mmol/L   Chloride 97 (L) 98 - 111 mmol/L   CO2 31 22 - 32 mmol/L   Glucose, Bld 101 (H) 70 - 99 mg/dL   BUN 21 8 - 23 mg/dL   Creatinine, Ser 6.03 (H)  0.61 - 1.24 mg/dL   Calcium 9.3 8.9 - 10.3 mg/dL   Total Protein 6.4 (L) 6.5 - 8.1 g/dL   Albumin 2.9 (L) 3.5 - 5.0 g/dL   AST 17 15 - 41 U/L   ALT 13 0 - 44 U/L   Alkaline Phosphatase 144 (H) 38 - 126 U/L   Total Bilirubin 0.2 (L) 0.3 - 1.2 mg/dL   GFR calc non Af Amer 8 (L) >60 mL/min   GFR calc Af Amer 10 (L) >60 mL/min   Anion gap 11 5 - 15    Comment: Performed at [facility], [address] [place], [place] [address]  CBC with Differential     Status: Abnormal   Collection Time: 11/23/18 12:31 PM  Result Value Ref Range   WBC 7.7 4.0 - 10.5 K/uL   RBC 4.09 (L) 4.22 - 5.81 MIL/uL   Hemoglobin 11.1 (L) 13.0 - 17.0 g/dL   HCT 37.9 (L) 39.0 - 52.0 %   MCV 92.7 80.0 - 100.0 fL   MCH 27.1 26.0 - 34.0 pg   MCHC 29.3 (L) 30.0 - 36.0 g/dL   RDW 17.6 (H) 11.5 - 15.5 %   Platelets 395 150 - 400 K/uL   nRBC 0.0 0.0 - 0.2 %   Neutrophils Relative % 67 %   Neutro Abs 5.3 1.7 - 7.7 K/uL   Lymphocytes Relative 16 %   Lymphs Abs 1.2 0.7 - 4.0 K/uL   Monocytes Relative 10 %   Monocytes Absolute 0.8 0.1 - 1.0 K/uL   Eosinophils Relative 5 %   Eosinophils Absolute 0.4 0.0 - 0.5 K/uL   Basophils Relative 1 %   Basophils Absolute 0.1 0.0 - 0.1 K/uL   Immature Granulocytes 1 %   Abs Immature Granulocytes 0.04 0.00 - 0.07 K/uL    Comment: Performed at [facility] [address] [place], [place] [address]  Lipase, blood     Status: None   Collection Time: 11/23/18 12:31 PM  Result Value Ref Range   Lipase 21 11 - 51 U/L    Comment: Performed at [facility], [place] [address] [place], [place] [address]  Glucose, capillary     Status: Abnormal   Collection Time: 11/23/18  9:36 PM  Result Value Ref Range   Glucose-Capillary 182 (H) 70 - 99 mg/dL  Phosphorus     Status: None   Collection Time: 11/23/18 10:48 PM  Result Value Ref Range   Phosphorus 4.1  2.5 - 4.6 mg/dL    Comment: Performed at [facility], [place] [address] [place], [place] [address]  Magnesium      Status: None   Collection Time: 11/23/18 10:48 PM  Result Value Ref Range   Magnesium 2.1 1.7 - 2.4 mg/dL    Comment: Performed at [facility], [place] [address] [place], [place] [address]  Protime-INR     Status: None   Collection Time: 11/23/18 10:48 PM  Result Value Ref Range   Prothrombin Time 13.8 11.4 - 15.2 seconds   INR 1.07     Comment: Performed at [facility] [address], [place], [place] [address]  Comprehensive metabolic panel     Status: Abnormal   Collection Time: 11/24/18  2:19 AM  Result Value Ref Range   Sodium 140 135 - 145 mmol/L   Potassium 3.6 3.5 - 5.1 mmol/L   Chloride 98 98 - 111 mmol/L   CO2 28 22 - 32 mmol/L   Glucose, Bld 156 (H) 70 - 99 mg/dL   BUN 30 (H) 8 - 23 mg/dL   Creatinine, Ser 7.07 (H) 0.61 - 1.24 mg/dL   Calcium 9.1 8.9 - 10.3 mg/dL   Total Protein 6.0 (L) 6.5 - 8.1 g/dL   Albumin 2.9 (L) 3.5 - 5.0 g/dL   AST 14 (L) 15 - 41 U/L   ALT 13 0 - 44 U/L   Alkaline Phosphatase 129 (H) 38 - 126 U/L   Total Bilirubin 0.5 0.3 - 1.2 mg/dL   GFR calc non Af Amer 7 (L) >60 mL/min   GFR calc Af Amer 8 (L) >60 mL/min   Anion gap 14 5 - 15    Comment: Performed at [facility], [place] [address], [place], [place] [address]  CBC     Status: Abnormal   Collection Time: 11/24/18  2:19 AM  Result Value Ref Range   WBC 8.3 4.0 - 10.5 K/uL   RBC 3.71 (L) 4.22 - 5.81 MIL/uL   Hemoglobin 10.5 (L) 13.0 - 17.0 g/dL   HCT 34.1 (L) 39.0 - 52.0 %   MCV 91.9 80.0 - 100.0 fL   MCH 28.3 26.0 - 34.0 pg   MCHC 30.8 30.0 - 36.0 g/dL   RDW 17.6 (H) 11.5 - 15.5 %   Platelets 407 (H) 150 - 400 K/uL   nRBC 0.0 0.0 - 0.2 %    Comment: Performed at [facility], [place] [address], [place], [place] [address]  Magnesium     Status: None   Collection Time: 11/24/18  2:19 AM  Result Value Ref Range   Magnesium 2.2 1.7 - 2.4 mg/dL    Comment: Performed at [facility] [address], [place], [place] [address]  Phosphorus     Status: None    Collection Time: 11/24/18  2:19 AM  Result Value Ref Range   Phosphorus 4.4 2.5 - 4.6 mg/dL    Comment: Performed at [facility] [address] [place],  [address]  Glucose, capillary     Status: Abnormal   Collection Time: 11/24/18  4:37 AM  Result Value Ref Range   Glucose-Capillary 131 (H) 70 - 99 mg/dL   Nm Hepatobiliary Including Gb  Result Date: 11/23/2018 CLINICAL DATA:  78 year old male with chronic cholecystitis percutaneous cholecystostomy earlier this year but then status post laparoscopic cholecystectomy on 11/14/2018 with a 58 [place] Blake drain placed into the gallbladder fossa  at the time of surgery. Increasing black and greenish fluid from the drain over the past 2 days. EXAM: NUCLEAR MEDICINE HEPATOBILIARY IMAGING TECHNIQUE: Sequential images of the abdomen were obtained out to 60 minutes following intravenous administration of radiopharmaceutical. RADIOPHARMACEUTICALS:  Five mCi Tc-40m  Choletec IV COMPARISON:  CT Abdomen and Pelvis 11/11/2018 FINDINGS: Prompt radiotracer uptake by the liver and clearance of the blood pool. Prompt visualization of the CBD by 10 minutes. Normal appearing CBD, duodenum, and progressive small bowel activity between ten and 40 minutes into the exam. No abnormal radiotracer accumulation identified on those images. However, abruptly beginning on the 45 minute image there is abundant radiotracer activity within the percutaneous drain (image 52) which mildly increases until the study was concluded at 55 minutes. IMPRESSION: Positive for biliary leak: Abundant radiotracer activity within the percutaneous drain beginning at the 45 minutes mark of this exam. This was discussed by telephone with PA LINDSEY LAYDEN in the ED on 11/23/2018 at 16:19 . Normal appearing CBD and small bowel activity. Electronically Signed   By: Genevie Ann M.D.   On: 11/23/2018 16:20    PMH:   Past Medical History:  Diagnosis Date  . Acute cholecystitis 08/17/2018  .  Atrial fibrillation ([place])   . CAD (coronary artery disease)   . Cholecystitis   . COPD (chronic obstructive pulmonary disease) ([place])   . Diabetes mellitus without complication ([place])   . ESRD (end stage renal disease) on dialysis ([place])   . Hypertension   . OSA (obstructive sleep apnea)   . Renal disorder   . Spinal stenosis     PSH:   Past Surgical History:  Procedure Laterality Date  . CHOLECYSTECTOMY N/A 11/14/2018   Procedure: LAPAROSCOPIC CHOLECYSTECTOMY WITH INTRAOPERATIVE CHOLANGIOGRAM;  Surgeon: Coralie Keens, MD;  Location: [facility];  Service: General;  Laterality: N/A;  . HAND SURGERY Right   . IR DIALY SHUNT INTRO NEEDLE/INTRACATH INITIAL W/IMG LEFT Left 08/20/2018  . IR PERC CHOLECYSTOSTOMY  08/19/2018  . IR RADIOLOGIST EVAL & MGMT  09/29/2018  . SHOULDER SURGERY Right   . TIBIA FRACTURE SURGERY      Allergies:  Allergies  Allergen Reactions  . Avelox [Moxifloxacin Hcl] Other (See Comments)    unknown  . Shellfish Allergy Nausea And Vomiting    Stomach  . Sulfa Antibiotics Other (See Comments)    unknown  . Tetracyclines & Related Other (See Comments)    unknown    Medications:   Prior to Admission medications   Medication Sig Start Date End Date Taking? Authorizing Provider  aspirin EC 81 MG tablet Take 81 mg by mouth every other day.    Yes [provider]  atorvastatin (LIPITOR) 20 MG tablet Take 1 tablet (20 mg total) by mouth every evening. Patient taking differently: Take 10 mg by mouth every evening.  08/24/18  Yes Elgergawy, Silver Huguenin, MD  buPROPion (WELLBUTRIN XL) 150 MG 24 hr tablet Take 150 mg by mouth at bedtime.    Yes [provider]  docusate sodium (COLACE) 100 MG capsule Take 100 mg by mouth 2 (two) times daily.    Yes [provider]  escitalopram (LEXAPRO) 10 MG tablet Take 10 mg every evening by mouth. 08/30/15  Yes [provider]  gabapentin (NEURONTIN) 300 MG capsule Take 300 mg by mouth at bedtime.   Yes  [provider]  insulin detemir (LEVEMIR) 100 UNIT/ML injection Inject 0.14 mLs (14 Units total) into the skin 2 (two) times daily. Patient taking  differently: Inject 16-20 Units into the skin See admin instructions. Sliding scale 20 mg in the morning and 16 in the evening 08/24/18  Yes Elgergawy, Silver Huguenin, MD  Insulin Lispro Prot & Lispro (HUMALOG MIX 75/25 KWIKPEN) (75-25) 100 UNIT/ML Kwikpen Take 10 Units by mouth 2 (two) times daily. 07/12/18  Yes [provider]  lactulose, encephalopathy, (CHRONULAC) 10 GM/15ML SOLN Take 15 mLs by mouth daily before supper.  08/02/18  Yes [provider]  levothyroxine (SYNTHROID, LEVOTHROID) 125 MCG tablet Take 125 mcg daily by mouth.   Yes [provider]  lidocaine-prilocaine (EMLA) cream Apply 1 application topically See admin instructions. To access site (AVF) 1-2 hours before dialysis. Cover with occlusive dressing (Saran warp) 08/02/18  Yes [provider]  linaclotide (LINZESS) 290 MCG CAPS capsule Take 290 mcg by mouth daily after supper.    Yes [provider]  loratadine (CLARITIN) 10 MG tablet Take 10 mg every evening by mouth.   Yes [provider]  midodrine (PROAMATINE) 2.5 MG tablet Take 2.5 mg by mouth 3 (three) times daily. 09/20/18  Yes [provider]  montelukast (SINGULAIR) 10 MG tablet Take 10 mg every evening by mouth.   Yes [provider]  sevelamer carbonate (RENVELA) 800 MG tablet Take 3 tablets (2,400 mg total) by mouth 3 (three) times daily with meals. Patient taking differently: Take 2,400 mg by mouth 2 (two) times daily.  08/24/18  Yes Elgergawy, Silver Huguenin, MD    Discontinued Meds:   Medications Discontinued During This Encounter  Medication Reason  . lidocaine-prilocaine (EMLA) cream 1 application Duplicate  . enoxaparin (LOVENOX) injection 40 mg Duplicate    Social History:  reports that he has never smoked. He has quit using smokeless tobacco. He  reports previous alcohol use. He reports that he does not use drugs.  Family History:   Family History  Problem Relation Age of Onset  . Heart disease Mother   . Heart disease Father   . Heart disease Sister     Blood pressure 134/74, pulse 85, temperature 97.9 F (36.6 C), temperature source Oral, resp. rate 18, height 5\' 10"  (1.778 m), weight 119.1 kg, SpO2 98 %. General appearance: alert, cooperative and appears stated age Head: Normocephalic, without obvious abnormality, atraumatic Eyes: negative Neck: no adenopathy, no carotid bruit, supple, symmetrical, trachea midline and thyroid not enlarged, symmetric, no tenderness/mass/nodules Back: symmetric, no curvature. ROM normal. No CVA tenderness. Resp: clear to auscultation bilaterally Chest wall: no tenderness Cardio: regular rate and rhythm, S1, S2 normal, no murmur, click, rub or gallop GI: Obese, +BS no reb/ guarding Extremities: extremities normal, atraumatic, no cyanosis or edema Pulses: 2+ and symmetric Skin: Skin color, texture, turgor normal. No rashes or lesions Lymph nodes: Cervical, supraclavicular, and axillary nodes normal. Neurologic: Grossly normal       LIN, Hunt Oris, MD 11/24/2018, 8:55 AM

## 2018-11-24 NOTE — Progress Notes (Signed)
Pharmacy Antibiotic Note  Chibuike Macneal is a 78 y.o. male admitted on 11/23/2018 with surgical prophylaxis.  Pharmacy has been consulted for Zosyn dosing.  Patient with bile leak in need of ERCP in AM. Pharmacy consulted for surgical prophylaxis recommendation and dosing in patient with ESRD and multiple allergies including Bactrim and fluoroquinolones. Discussed options of Zosyn +/- Vancomycin if concern for ampicillin-resistant enterococcus. Dr. Alessandra Bevels decided Zosyn alone was adequate. Surgery currently scheduled for 13:15.   Plan: Zosyn 3.375g IV x1 pre-operatively (34min bolus) on 1/2.  Pharmacy will sign off.  Please re-consult if further needs.    Height: 5\' 10"  (177.8 cm) Weight: 262 lb 8 oz (119.1 kg) IBW/kg (Calculated) : 73  Temp (24hrs), Avg:98.2 F (36.8 C), Min:97.9 F (36.6 C), Max:98.4 F (36.9 C)  Recent Labs  Lab 11/23/18 1231 11/24/18 0219  WBC 7.7 8.3  CREATININE 6.03* 7.07*    Estimated Creatinine Clearance: 11.7 mL/min (A) (by C-G formula based on SCr of 7.07 mg/dL (H)).    Allergies  Allergen Reactions  . Avelox [Moxifloxacin Hcl] Other (See Comments)    unknown  . Shellfish Allergy Nausea And Vomiting    Stomach  . Sulfa Antibiotics Other (See Comments)    unknown  . Tetracyclines & Related Other (See Comments)    unknown    Sloan Leiter, PharmD, BCPS, BCCCP Clinical Pharmacist Please refer to [facility] for [place] numbers 11/24/2018 2:14 PM

## 2018-11-25 ENCOUNTER — Encounter (HOSPITAL_COMMUNITY)
Admission: EM | Disposition: A | Discharge status: Home / Self Care | Payer: Self-pay | Source: Home / Self Care | Attending: Internal Medicine

## 2018-11-25 ENCOUNTER — Inpatient Hospital Stay (HOSPITAL_COMMUNITY): Payer: Medicare Other

## 2018-11-25 ENCOUNTER — Encounter (HOSPITAL_COMMUNITY): Payer: Self-pay | Admitting: Gastroenterology

## 2018-11-25 ENCOUNTER — Inpatient Hospital Stay (HOSPITAL_COMMUNITY): Payer: Medicare Other | Admitting: Anesthesiology

## 2018-11-25 DIAGNOSIS — Z992 Dependence on renal dialysis: Secondary | ICD-10-CM

## 2018-11-25 DIAGNOSIS — N186 End stage renal disease: Secondary | ICD-10-CM

## 2018-11-25 HISTORY — PX: BILIARY STENT PLACEMENT: SHX5538

## 2018-11-25 HISTORY — PX: ERCP: SHX5425

## 2018-11-25 HISTORY — PX: SPHINCTEROTOMY: SHX5544

## 2018-11-25 LAB — GLUCOSE, CAPILLARY
Glucose-Capillary: 148 mg/dL — ABNORMAL HIGH (ref 70–99)
Glucose-Capillary: 75 mg/dL (ref 70–99)
Glucose-Capillary: 85 mg/dL (ref 70–99)
Glucose-Capillary: 87 mg/dL (ref 70–99)
Glucose-Capillary: 90 mg/dL (ref 70–99)
Glucose-Capillary: 94 mg/dL (ref 70–99)

## 2018-11-25 LAB — COMPREHENSIVE METABOLIC PANEL
ALT: 11 U/L (ref 0–44)
AST: 13 U/L — ABNORMAL LOW (ref 15–41)
Albumin: 2.7 g/dL — ABNORMAL LOW (ref 3.5–5.0)
Alkaline Phosphatase: 135 U/L — ABNORMAL HIGH (ref 38–126)
Anion gap: 13 (ref 5–15)
BUN: 37 mg/dL — ABNORMAL HIGH (ref 8–23)
CO2: 28 mmol/L (ref 22–32)
Calcium: 8.7 mg/dL — ABNORMAL LOW (ref 8.9–10.3)
Chloride: 100 mmol/L (ref 98–111)
Creatinine, Ser: 8.34 mg/dL — ABNORMAL HIGH (ref 0.61–1.24)
GFR calc Af Amer: 7 mL/min — ABNORMAL LOW (ref >60)
GFR calc non Af Amer: 6 mL/min — ABNORMAL LOW (ref >60)
Glucose, Bld: 79 mg/dL (ref 70–99)
Potassium: 3.7 mmol/L (ref 3.5–5.1)
Sodium: 141 mmol/L (ref 135–145)
Total Bilirubin: 0.5 mg/dL (ref 0.3–1.2)
Total Protein: 6 g/dL — ABNORMAL LOW (ref 6.5–8.1)

## 2018-11-25 LAB — CBC
HCT: 34.6 % — ABNORMAL LOW (ref 39.0–52.0)
Hemoglobin: 10.4 g/dL — ABNORMAL LOW (ref 13.0–17.0)
MCH: 27.2 pg (ref 26.0–34.0)
MCHC: 30.1 g/dL (ref 30.0–36.0)
MCV: 90.6 fL (ref 80.0–100.0)
Platelets: 413 10*3/uL — ABNORMAL HIGH (ref 150–400)
RBC: 3.82 MIL/uL — ABNORMAL LOW (ref 4.22–5.81)
RDW: 17.5 % — ABNORMAL HIGH (ref 11.5–15.5)
WBC: 10.8 10*3/uL — ABNORMAL HIGH (ref 4.0–10.5)
nRBC: 0 % (ref 0.0–0.2)

## 2018-11-25 SURGERY — ERCP, WITH INTERVENTION IF INDICATED
Anesthesia: General

## 2018-11-25 MED ORDER — LIDOCAINE HCL (PF) 1 % IJ SOLN: 5.0000 mL | INTRAMUSCULAR | Status: DC | PRN

## 2018-11-25 MED ORDER — SUCCINYLCHOLINE CHLORIDE 200 MG/10ML IV SOSY
PREFILLED_SYRINGE | INTRAVENOUS | Status: DC | PRN
  Administered 2018-11-25: 140 mg via INTRAVENOUS

## 2018-11-25 MED ORDER — DEXAMETHASONE SODIUM PHOSPHATE 10 MG/ML IJ SOLN
INTRAMUSCULAR | Status: DC | PRN
  Administered 2018-11-25: 5 mg via INTRAVENOUS

## 2018-11-25 MED ORDER — RENA-VITE PO TABS
1.0000 | ORAL_TABLET | Freq: Every day | ORAL | Status: DC
  Administered 2018-11-26: 1 via ORAL
  Filled 2018-11-25: qty 1

## 2018-11-25 MED ORDER — PROPOFOL 10 MG/ML IV BOLUS
INTRAVENOUS | Status: DC | PRN
  Administered 2018-11-25: 200 mg via INTRAVENOUS

## 2018-11-25 MED ORDER — IOPAMIDOL (ISOVUE-300) INJECTION 61%
INTRAVENOUS | Status: AC
  Filled 2018-11-25: qty 50

## 2018-11-25 MED ORDER — SODIUM CHLORIDE 0.9 % IV SOLN
INTRAVENOUS | Status: DC | PRN
  Administered 2018-11-25: 25 ug/min via INTRAVENOUS

## 2018-11-25 MED ORDER — SODIUM CHLORIDE 0.9 % IV SOLN: 100.0000 mL | INTRAVENOUS | Status: DC | PRN

## 2018-11-25 MED ORDER — PRO-STAT SUGAR FREE PO LIQD
30.0000 mL | Freq: Two times a day (BID) | ORAL | Status: DC
  Administered 2018-11-26: 30 mL via ORAL
  Filled 2018-11-25: qty 30

## 2018-11-25 MED ORDER — ONDANSETRON HCL 4 MG/2ML IJ SOLN
INTRAMUSCULAR | Status: DC | PRN
  Administered 2018-11-25: 4 mg via INTRAVENOUS

## 2018-11-25 MED ORDER — DOXERCALCIFEROL 4 MCG/2ML IV SOLN
3.0000 ug | Freq: Once | INTRAVENOUS | Status: AC
  Administered 2018-11-25: 3 ug via INTRAVENOUS
  Filled 2018-11-25: qty 2

## 2018-11-25 MED ORDER — FENTANYL CITRATE (PF) 250 MCG/5ML IJ SOLN
INTRAMUSCULAR | Status: DC | PRN
  Administered 2018-11-25: 100 ug via INTRAVENOUS

## 2018-11-25 MED ORDER — PHENYLEPHRINE 40 MCG/ML (10ML) SYRINGE FOR IV PUSH (FOR BLOOD PRESSURE SUPPORT)
PREFILLED_SYRINGE | INTRAVENOUS | Status: DC | PRN
  Administered 2018-11-25: 80 ug via INTRAVENOUS
  Administered 2018-11-25: 120 ug via INTRAVENOUS

## 2018-11-25 MED ORDER — PENTAFLUOROPROP-TETRAFLUOROETH EX AERO: 1.0000 "application " | INHALATION_SPRAY | CUTANEOUS | Status: DC | PRN

## 2018-11-25 MED ORDER — INDOMETHACIN 50 MG RE SUPP
RECTAL | Status: DC | PRN
  Administered 2018-11-25: 100 mg via RECTAL

## 2018-11-25 MED ORDER — HEPARIN SODIUM (PORCINE) 1000 UNIT/ML DIALYSIS: 1000.0000 [IU] | INTRAMUSCULAR | Status: DC | PRN

## 2018-11-25 MED ORDER — SODIUM CHLORIDE 0.9 % IV SOLN: INTRAVENOUS | Status: DC

## 2018-11-25 MED ORDER — ALTEPLASE 2 MG IJ SOLR: 2.0000 mg | Freq: Once | INTRAMUSCULAR | Status: DC | PRN

## 2018-11-25 MED ORDER — DOXERCALCIFEROL 4 MCG/2ML IV SOLN
INTRAVENOUS | Status: AC
  Administered 2018-11-25: 3 ug via INTRAVENOUS
  Filled 2018-11-25: qty 2

## 2018-11-25 MED ORDER — INDOMETHACIN 50 MG RE SUPP
RECTAL | Status: AC
  Filled 2018-11-25: qty 2

## 2018-11-25 MED ORDER — LIDOCAINE 2% (20 MG/ML) 5 ML SYRINGE
INTRAMUSCULAR | Status: DC | PRN
  Administered 2018-11-25: 100 mg via INTRAVENOUS

## 2018-11-25 MED ORDER — GLUCAGON HCL RDNA (DIAGNOSTIC) 1 MG IJ SOLR
INTRAMUSCULAR | Status: AC
  Filled 2018-11-25: qty 1

## 2018-11-25 MED ORDER — IOPAMIDOL (ISOVUE-300) INJECTION 61%
INTRAVENOUS | Status: DC | PRN
  Administered 2018-11-25: 25 mL via INTRAVENOUS

## 2018-11-25 MED ORDER — GLUCAGON HCL RDNA (DIAGNOSTIC) 1 MG IJ SOLR
INTRAMUSCULAR | Status: DC | PRN
  Administered 2018-11-25 (×3): .5 mg via INTRAVENOUS

## 2018-11-25 MED ORDER — LIDOCAINE-PRILOCAINE 2.5-2.5 % EX CREA: 1.0000 "application " | TOPICAL_CREAM | CUTANEOUS | Status: DC | PRN

## 2018-11-25 MED ORDER — ONDANSETRON HCL 4 MG/2ML IJ SOLN
INTRAMUSCULAR | Status: AC
  Administered 2018-11-25: 4 mg via INTRAVENOUS
  Filled 2018-11-25: qty 2

## 2018-11-25 NOTE — Progress Notes (Signed)
Pt transfer for ERCP.

## 2018-11-25 NOTE — Anesthesia Postprocedure Evaluation (Signed)
Anesthesia Post Note  Patient: Christian Dalton  Procedure(s) Performed: ENDOSCOPIC RETROGRADE CHOLANGIOPANCREATOGRAPHY (ERCP) (N/A ) SPHINCTEROTOMY REMOVAL OF STONES BILIARY STENT PLACEMENT     Patient location during evaluation: PACU Anesthesia Type: General Level of consciousness: awake and alert Pain management: pain level controlled Vital Signs Assessment: post-procedure vital signs reviewed and stable Respiratory status: spontaneous breathing, nonlabored ventilation and respiratory function stable Cardiovascular status: blood pressure returned to baseline and stable Postop Assessment: no apparent nausea or vomiting Anesthetic complications: no    Last Vitals:  Vitals:   11/25/18 1500 11/25/18 1559  BP: (!) 146/78 133/79  Pulse: 81 79  Resp: 17 20  Temp:  36.9 C  SpO2: 97% 92%    Last Pain:  Vitals:   11/25/18 1742  TempSrc:   PainSc: [place]

## 2018-11-25 NOTE — Anesthesia Preprocedure Evaluation (Addendum)
Anesthesia Evaluation  Patient identified by MRN, date of birth, ID band Patient awake    Reviewed: Allergy & Precautions, NPO status , Patient's Chart, lab work & pertinent test results  History of Anesthesia Complications Negative for: history of anesthetic complications  Airway Mallampati: II  TM Distance: >3 FB Neck ROM: Full    Dental  (+) Dental Advisory Given, Edentulous Upper, Missing, Poor Dentition,    Pulmonary sleep apnea , COPD,  COPD inhaler,    Pulmonary exam normal breath sounds clear to auscultation       Cardiovascular hypertension, + CAD  Normal cardiovascular exam+ dysrhythmias Atrial Fibrillation  Rhythm:Regular Rate:Normal     Neuro/Psych negative neurological ROS     GI/Hepatic negative GI ROS, Neg liver ROS,   Endo/Other  negative endocrine ROSdiabetes, Type 2, Insulin Dependent  Renal/GU Dialysis and ESRFRenal disease (HD T/Th/Sa)     Musculoskeletal negative musculoskeletal ROS (+)   Abdominal   Peds  Hematology negative hematology ROS (+) anemia , Hgb 10.5 on 11/24/18   Anesthesia Other Findings Day of surgery medications reviewed with the patient.  Reproductive/Obstetrics                           Anesthesia Physical Anesthesia Plan  ASA: IV  Anesthesia Plan: General   Post-op Pain Management:    Induction: Intravenous  PONV Risk Score and Plan: Treatment may vary due to age or medical condition, Ondansetron and Dexamethasone  Airway Management Planned: Oral ETT  Additional Equipment:   Intra-op Plan:   Post-operative Plan: Extubation in OR  Informed Consent: I have reviewed the patients History and Physical, chart, labs and discussed the procedure including the risks, benefits and alternatives for the proposed anesthesia with the patient or authorized representative who has indicated his/her understanding and acceptance.   Dental advisory  given  Plan Discussed with: CRNA  Anesthesia Plan Comments:        Anesthesia Quick Evaluation

## 2018-11-25 NOTE — Op Note (Signed)
[facility] Patient Name: Christian Dalton Procedure Date : 11/25/2018 MRN: [id number] Attending MD: [name] , MD Date of Birth: [birth year] CSN: [id number] Age: 78 Admit Type: Inpatient Procedure:                ERCP Indications:              Bile leak Providers:                [name], MD, Carlyn Reichert, RN, Raynelle Bring, RN,                            Cletis Athens, Technician, Janeece Agee, Technician,                            Carver Fila Referring MD:              Medicines:                Monitored Anesthesia Care Complications:            No immediate complications. Estimated blood loss:                            None Estimated Blood Loss:     Estimated blood loss: none. Procedure:                Pre-Anesthesia Assessment:                           - Prior to the procedure, a History and Physical                            was performed, and patient medications and                            allergies were reviewed. The patient's tolerance of                            previous anesthesia was also reviewed. The risks                            and benefits of the procedure and the sedation                            options and risks were discussed with the patient.                            All questions were answered, and informed consent                            was obtained. Prior Anticoagulants: The patient has                            taken Lovenox (enoxaparin), last dose was 1 day                            prior to procedure. ASA  Grade Assessment: IV - A                            patient with severe systemic disease that is a                            constant threat to life. After reviewing the risks                            and benefits, the patient was deemed in                            satisfactory condition to undergo the procedure.                           After obtaining informed consent, the scope was                            passed  under direct vision. Throughout the                            procedure, the patient's blood pressure, pulse, and                            oxygen saturations were monitored continuously. The                            TJF-Q180V ([id number]) Olympus duodenoscope was                            introduced through the mouth, and used to inject                            contrast into and used to cannulate the bile duct.                            The ERCP was accomplished without difficulty. The                            patient tolerated the procedure well. Scope In: Scope Out: Findings:      The scout film was normal. The esophagus was successfully intubated       under direct vision. The scope was advanced to a normal major papilla in       the descending duodenum without detailed examination of the pharynx,       larynx and associated structures, and upper GI tract. The upper GI tract       was grossly normal.      The bile duct was deeply cannulated with the sphincterotome with double       wire technique(one wire left in the pancreatic duct, but removed after       canulation of CBD) and bowing of the sphinctertome.      A small diverticulum was noted adjancent to the ampulla.      Contrast was injected. I personally interpreted the bile  duct images.       There was brisk flow of contrast through the ducts. Image quality was       excellent. Contrast extended to the entire biliary tree.      Extravasation of contrast originating from the cystic duct was observed       compatible with a bile leak.      A straight Roadrunner wire was passed into the biliary tree.      A 10 mm biliary sphincterotomy was made with a braided sphincterotome       using ERBE electrocautery. There was no post-sphincterotomy bleeding.       The biliary tree was swept with a 12 mm balloon starting at the       bifurcation. Nothing was found.      One 7 Fr by 7 cm plastic stent with a single external flap and a  single       internal flap was placed 6 cm into the common bile duct. Bile flowed       through the stent. The stent was in good position. Impression:               - A bile leak was found.                           - A biliary sphincterotomy was performed.                           - The biliary tree was swept and nothing was found.                           - One plastic stent was placed into the common bile                            duct.                           - Wire was inadvertently placed in to the                            pancreatic duct during canulation for CBD, but                            pancreatic duct was not injected, rectal                            indomethacin given post procedure to avoid                            pancreatitis. Moderate Sedation:      Patient did not receive moderate sedation for this procedure, but       instead received monitored anesthesia care. Recommendation:           - Resume regular diet.                           - Repeat ERCP/EGD at appointment to be scheduled  for stent removal in the next 4-8 weeks depending                            on JP drainage and healing of bile leak. Procedure Code(s):        --- Professional ---                           [phone], Endoscopic retrograde                            cholangiopancreatography (ERCP); with placement of                            endoscopic stent into biliary or pancreatic duct,                            including pre- and post-dilation and guide wire                            passage, when performed, including sphincterotomy,                            when performed, each stent Diagnosis Code(s):        --- Professional ---                           K83.9, Disease of biliary tract, unspecified                           K83.8, Other specified diseases of biliary tract CPT copyright 2018 American Medical Association. All rights reserved. The codes  documented in this report are preliminary and upon coder review may  be revised to meet current compliance requirements. [name], MD 11/25/2018 2:17:53 PM This report has been signed electronically. Number of Addenda: 0

## 2018-11-25 NOTE — Interval H&P Note (Signed)
History and Physical Interval Note: 77/male with bile leak, post cholecystectomy for an ERCP with stent placement.  11/25/2018 1:17 PM  Christian Dalton  has presented today for ERCP, with the diagnosis of Bile leak  The various methods of treatment have been discussed with the patient and family. After consideration of risks, benefits and other options for treatment, the patient has consented to  Procedure(s): ENDOSCOPIC RETROGRADE CHOLANGIOPANCREATOGRAPHY (ERCP) (N/A) as a surgical intervention .  The patient's history has been reviewed, patient examined, no change in status, stable for surgery.  I have reviewed the patient's chart and labs.  Questions were answered to the patient's satisfaction.     [name]

## 2018-11-25 NOTE — Anesthesia Procedure Notes (Signed)
Procedure Name: Intubation Date/Time: 11/25/2018 1:30 PM Performed by: Alain Marion, CRNA Pre-anesthesia Checklist: Patient identified, Emergency Drugs available, Suction available and Patient being monitored Patient Re-evaluated:Patient Re-evaluated prior to induction Oxygen Delivery Method: Circle System Utilized Preoxygenation: Pre-oxygenation with 100% oxygen Induction Type: IV induction Ventilation: Mask ventilation without difficulty Laryngoscope Size: Miller and 2 Grade View: Grade I Tube type: Oral Tube size: 7.5 mm Number of attempts: 1 Airway Equipment and Method: Stylet and Oral airway Placement Confirmation: ETT inserted through vocal cords under direct vision,  positive ETCO2 and breath sounds checked- equal and bilateral Secured at: 23 cm Tube secured with: Tape Dental Injury: Teeth and Oropharynx as per pre-operative assessment

## 2018-11-25 NOTE — Brief Op Note (Signed)
11/23/2018 - 11/25/2018  2:18 PM  PATIENT:  Christian Dalton  78 y.o. male  PRE-OPERATIVE DIAGNOSIS:  Bile leak  POST-OPERATIVE DIAGNOSIS:  bile leak, billiary stent placed, sphincterotomy, extraction balloon used.   PROCEDURE:  Procedure(s): ENDOSCOPIC RETROGRADE CHOLANGIOPANCREATOGRAPHY (ERCP) (N/A) SPHINCTEROTOMY REMOVAL OF STONES BILIARY STENT PLACEMENT  SURGEON:  Surgeon(s) and Role:    [name], MD - Primary  PHYSICIAN ASSISTANT:   ASSISTANTS: Kingsley Plan, RN, Cletis Athens, Tech  ANESTHESIA:   topical and MAC  EBL:  Minimal  BLOOD ADMINISTERED:none  DRAINS: none   LOCAL MEDICATIONS USED:  NONE  SPECIMEN:  No Specimen  DISPOSITION OF SPECIMEN:  N/A  COUNTS:  YES  TOURNIQUET:  * No tourniquets in log *  DICTATION: .Dragon Dictation  PLAN OF CARE: Admit to inpatient   PATIENT DISPOSITION:  PACU - hemodynamically stable.   Delay start of Pharmacological VTE agent (>24hrs) due to surgical blood loss or risk of bleeding: no

## 2018-11-25 NOTE — Transfer of Care (Signed)
Immediate Anesthesia Transfer of Care Note  Patient: Christian Dalton  Procedure(s) Performed: ENDOSCOPIC RETROGRADE CHOLANGIOPANCREATOGRAPHY (ERCP) (N/A ) SPHINCTEROTOMY REMOVAL OF STONES BILIARY STENT PLACEMENT  Patient Location: Endoscopy Unit  Anesthesia Type:General  Level of Consciousness: awake, alert  and oriented  Airway & Oxygen Therapy: Patient Spontanous Breathing and Patient connected to nasal cannula oxygen  Post-op Assessment: Report given to RN and Post -op Vital signs reviewed and stable  Post vital signs: Reviewed and stable  Last Vitals:  Vitals Value Taken Time  BP 155/92   Temp    Pulse    Resp    SpO2 100     Last Pain:  Vitals:   11/25/18 1240  TempSrc:   PainSc: 4       Patients Stated Pain Goal: 0 (31/59/45 8592)  Complications: No apparent anesthesia complications

## 2018-11-25 NOTE — Progress Notes (Signed)
PROGRESS NOTE    Christian Dalton   JIR:[id number]  DOB: 02/02/1941  DOA: 11/23/2018 PCP: Venetia Maxon, Sharon Mt, MD   Brief Narrative:  Christian Dalton  a 78 y.o. male with medical history significant for ESRD on TTS HD via LUE AVF, CAD, hypothyroidism, diabetes mellitus type 2, hypertension, undocumented COPD, OSA on CPAP and recent ascending cholangitis status post cholecystostomy drain 93 months ago) followed by laparoscopic cholecystectomy on 12/22 who presented to the ED from surgery clinic with 3 to 4 days of mild abdominal pain and increased greenish drainage from his biliary drain.  HIDA scan demonstrated the presence of a biliary leak.    Subjective: Evaluated this AM. He had no complaints other than being hungry.  Assessment & Plan:   Active Problems:   Bile leak, postoperative - large amount of bile in drain- he has been draining his bulb it about 3 x day at home - gen surgery and [facility] following - s/p ERCP, sphincterotomy and plastic stent placement - follow for acute pancreatitis as wire accidentally placed in pancreatic duct during ERCP  ESRD on HD, 2ndary hyper para, anemia of CKD - TTS dialysis - per nephrology  DM2 - on Levemir and  SSI- reduced Levemir to 8U BID as was NPO overnight for a procedure- continue at this dose for today  Chronic hypotension - cont midodrine  Hypothyroid - cont Synthroid  Depression - cont Lexapro and Wellbutrin  IBS with constipation - Linzess  OSA - start CPAP at bedtime  CAD - on ASA, Lipitor  DVT prophylaxis: Lovenox Code Status: Full code Family Communication:  Disposition Plan: follow on med surg Consultants:   gen surgery  [facility] Procedures:   ERCP  Antimicrobials:  Anti-infectives (From admission, onward)   Start     Dose/Rate Route Frequency Ordered Stop   11/25/18 1200  [MAR Hold]  piperacillin-tazobactam (ZOSYN) IVPB 3.375 g     (MAR Hold since Thu 11/25/2018 at 1236. Reason: Transfer to a Procedural  area.)   3.375 g 12.5 mL/hr over 240 Minutes Intravenous 30 min pre-op 11/24/18 1425         Objective: Vitals:   11/25/18 1435 11/25/18 1440 11/25/18 1445 11/25/18 1455  BP: (!) 151/73  (!) 148/76 (!) 92/53  Pulse: 80 82 81 82  Resp: 16 15 15  (!) 21  Temp:      TempSrc:      SpO2: 99% 93% 95% 95%  Weight:      Height:        Intake/Output Summary (Last 24 hours) at 11/25/2018 1505 Last data filed at 11/25/2018 1415 Gross per 24 hour  Intake 880 ml  Output 130 ml  Net 750 ml   Filed Weights   11/23/18 2017  Weight: 119.1 kg    Examination: General exam: Appears comfortable  HEENT: PERRLA, oral mucosa moist, no sclera icterus or thrush Respiratory system: Clear to auscultation. Respiratory effort normal. Cardiovascular system: S1 & S2 heard,  No murmurs  Gastrointestinal system: Abdomen soft, non-tender, nondistended. Normal bowel sound. No organomegaly Central nervous system: Alert and oriented. No focal neurological deficits. Extremities: No cyanosis, clubbing or edema Skin: No rashes or ulcers Psychiatry:  Mood & affect appropriate.     Data Reviewed: I have personally reviewed following labs and imaging studies  CBC: Recent Labs  Lab 11/23/18 1231 11/24/18 0219  WBC 7.7 8.3  NEUTROABS 5.3  --   HGB 11.1* 10.5*  HCT 37.9* 34.1*  MCV 92.7 91.9  PLT  395 035*   Basic Metabolic Panel: Recent Labs  Lab 11/23/18 1231 11/23/18 2248 11/24/18 0219 11/25/18 0145  NA 139  --  140 141  K 3.7  --  3.6 3.7  CL 97*  --  98 100  CO2 31  --  28 28  GLUCOSE 101*  --  156* 79  BUN 21  --  30* 37*  CREATININE 6.03*  --  7.07* 8.34*  CALCIUM 9.3  --  9.1 8.7*  MG  --  2.1 2.2  --   PHOS  --  4.1 4.4  --    GFR: Estimated Creatinine Clearance: 9.9 mL/min (A) (by C-G formula based on SCr of 8.34 mg/dL (H)). Liver Function Tests: Recent Labs  Lab 11/23/18 1231 11/24/18 0219 11/25/18 0145  AST 17 14* 13*  ALT 13 13 11   ALKPHOS 144* 129* 135*  BILITOT 0.2*  0.5 0.5  PROT 6.4* 6.0* 6.0*  ALBUMIN 2.9* 2.9* 2.7*   Recent Labs  Lab 11/23/18 1231  LIPASE 21   No results for input(s): AMMONIA in the last 168 hours. Coagulation Profile: Recent Labs  Lab 11/23/18 2248  INR 1.07   Cardiac Enzymes: No results for input(s): CKTOTAL, CKMB, CKMBINDEX, TROPONINI in the last 168 hours. BNP (last 3 results) No results for input(s): PROBNP in the last 8760 hours. HbA1C: No results for input(s): HGBA1C in the last 72 hours. CBG: Recent Labs  Lab 11/25/18 0046 11/25/18 0420 11/25/18 0626 11/25/18 0854 11/25/18 1312  GLUCAP 94 75 85 90 87   Lipid Profile: No results for input(s): CHOL, HDL, LDLCALC, TRIG, CHOLHDL, LDLDIRECT in the last 72 hours. Thyroid Function Tests: No results for input(s): TSH, T4TOTAL, FREET4, T3FREE, THYROIDAB in the last 72 hours. Anemia Panel: No results for input(s): VITAMINB12, FOLATE, FERRITIN, TIBC, IRON, RETICCTPCT in the last 72 hours. Urine analysis: No results found for: COLORURINE, APPEARANCEUR, LABSPEC, PHURINE, GLUCOSEU, HGBUR, BILIRUBINUR, KETONESUR, PROTEINUR, UROBILINOGEN, NITRITE, LEUKOCYTESUR Sepsis Labs: @LABRCNTIP (procalcitonin:4,lacticidven:4) )No results found for this or any previous visit (from the past 240 hour(s)).       Radiology Studies: Nm Hepatobiliary Including Gb  Result Date: 11/23/2018 CLINICAL DATA:  78 year old male with chronic cholecystitis percutaneous cholecystostomy earlier this year but then status post laparoscopic cholecystectomy on 11/14/2018 with a 19 [place] Blake drain placed into the gallbladder fossa at the time of surgery. Increasing black and greenish fluid from the drain over the past 2 days. EXAM: NUCLEAR MEDICINE HEPATOBILIARY IMAGING TECHNIQUE: Sequential images of the abdomen were obtained out to 60 minutes following intravenous administration of radiopharmaceutical. RADIOPHARMACEUTICALS:  Five mCi Tc-62m  Choletec IV COMPARISON:  CT Abdomen and Pelvis  11/11/2018 FINDINGS: Prompt radiotracer uptake by the liver and clearance of the blood pool. Prompt visualization of the CBD by 10 minutes. Normal appearing CBD, duodenum, and progressive small bowel activity between ten and 40 minutes into the exam. No abnormal radiotracer accumulation identified on those images. However, abruptly beginning on the 45 minute image there is abundant radiotracer activity within the percutaneous drain (image 52) which mildly increases until the study was concluded at 55 minutes. IMPRESSION: Positive for biliary leak: Abundant radiotracer activity within the percutaneous drain beginning at the 45 minutes mark of this exam. This was discussed by telephone with PA LINDSEY LAYDEN in the ED on 11/23/2018 at 16:19 . Normal appearing CBD and small bowel activity. Electronically Signed   By: Genevie Ann M.D.   On: 11/23/2018 16:20      Scheduled Meds: . [facility]  Hold] atorvastatin  20 mg Oral QPM  . [MAR Hold] buPROPion  150 mg Oral QHS  . [MAR Hold] Chlorhexidine Gluconate Cloth  6 each Topical Q0600  . [MAR Hold] docusate sodium  100 mg Oral BID  . [MAR Hold] doxercalciferol  3 mcg Intravenous Once  . [MAR Hold] enoxaparin (LOVENOX) injection  30 mg Subcutaneous Q24H  . [MAR Hold] escitalopram  10 mg Oral QPM  . [MAR Hold] feeding supplement (PRO-STAT SUGAR FREE 64)  30 mL Oral BID  . [MAR Hold] gabapentin  300 mg Oral QHS  . [MAR Hold] insulin aspart  0-15 Units Subcutaneous TID WC  . [MAR Hold] insulin detemir  8 Units Subcutaneous BID  . [MAR Hold] lactulose  10 g Oral QAC supper  . [MAR Hold] levothyroxine  125 mcg Oral Q0600  . [MAR Hold] linaclotide  290 mcg Oral QPC supper  . [MAR Hold] loratadine  10 mg Oral QPM  . [MAR Hold] midodrine  2.5 mg Oral TID WC  . [MAR Hold] montelukast  10 mg Oral QPM  . [MAR Hold] multivitamin  1 tablet Oral QHS  . [MAR Hold] sevelamer carbonate  2,400 mg Oral BID WC   Continuous Infusions: . sodium chloride 10 mL/hr at 11/25/18 1248    . sodium chloride    . [MAR Hold] piperacillin-tazobactam (ZOSYN)  IV       LOS: 2 days    Time spent in minutes: 35- reviewed prior notes, dicussed plan with GI    Debbe Odea, MD [facility] Pager: [email or web address] Password [facility] 11/25/2018, 3:05 PM

## 2018-11-25 NOTE — Progress Notes (Signed)
Pt back from ERCP alert and oriented with still RLQ JP drain and will do dialysis anytime today, I spoke with Melissa she said they will give [place] a call , his wife at the bedside.

## 2018-11-25 NOTE — Progress Notes (Signed)
HD tx initiated via 15Gx2 w/o problem Pull/push/flush well w/o problem VSS Will continue to monitor while on HD tx 

## 2018-11-25 NOTE — Progress Notes (Signed)
Patient ID: Christian Dalton, male   DOB: [birth year], 78 y.o.   MRN: [id number]       Subjective: Grumpy this morning that he didn't get to eat yesterday.  Otherwise no new complaints.  Objective: Vital signs in last 24 hours: Temp:  [98.1 F (36.7 C)-98.4 F (36.9 C)] 98.2 F (36.8 C) (01/02 0515) Pulse Rate:  [75-86] 86 (01/02 0628) Resp:  [15-18] 18 (01/02 0515) BP: (133-143)/(55-80) 143/55 (01/02 0515) SpO2:  [87 %-98 %] 94 % (01/02 0628) Last BM Date: 11/23/18  Intake/Output from previous day: 01/01 0701 - 01/02 0700 In: 960 [P.O.:960] Out: 80 [Drains:80] Intake/Output this shift: No intake/output data recorded.  PE: Abd: soft, obese, +BS, minimally tender in RUQ, drain in place with bilious output.  Only 80cc documented since yesterday.  Suspect it was higher as I emptied over 80cc when I saw him yesterday.  Incisions are all healing well.  Lab Results:  Recent Labs    11/23/18 1231 11/24/18 0219  WBC 7.7 8.3  HGB 11.1* 10.5*  HCT 37.9* 34.1*  PLT 395 407*   BMET Recent Labs    11/24/18 0219 11/25/18 0145  NA 140 141  K 3.6 3.7  CL 98 100  CO2 28 28  GLUCOSE 156* 79  BUN 30* 37*  CREATININE 7.07* 8.34*  CALCIUM 9.1 8.7*   PT/INR Recent Labs    11/23/18 2248  LABPROT 13.8  INR 1.07   CMP     Component Value Date/Time   NA 141 11/25/2018 0145   K 3.7 11/25/2018 0145   CL 100 11/25/2018 0145   CO2 28 11/25/2018 0145   GLUCOSE 79 11/25/2018 0145   BUN 37 (H) 11/25/2018 0145   CREATININE 8.34 (H) 11/25/2018 0145   CALCIUM 8.7 (L) 11/25/2018 0145   PROT 6.0 (L) 11/25/2018 0145   ALBUMIN 2.7 (L) 11/25/2018 0145   AST 13 (L) 11/25/2018 0145   ALT 11 11/25/2018 0145   ALKPHOS 135 (H) 11/25/2018 0145   BILITOT 0.5 11/25/2018 0145   GFRNONAA 6 (L) 11/25/2018 0145   GFRAA 7 (L) 11/25/2018 0145   Lipase     Component Value Date/Time   LIPASE 21 11/23/2018 1231       Studies/Results: Nm Hepatobiliary Including Gb  Result Date:  11/23/2018 CLINICAL DATA:  78 year old male with chronic cholecystitis percutaneous cholecystostomy earlier this year but then status post laparoscopic cholecystectomy on 11/14/2018 with a 19 [place] Blake drain placed into the gallbladder fossa at the time of surgery. Increasing black and greenish fluid from the drain over the past 2 days. EXAM: NUCLEAR MEDICINE HEPATOBILIARY IMAGING TECHNIQUE: Sequential images of the abdomen were obtained out to 60 minutes following intravenous administration of radiopharmaceutical. RADIOPHARMACEUTICALS:  Five mCi Tc-57m  Choletec IV COMPARISON:  CT Abdomen and Pelvis 11/11/2018 FINDINGS: Prompt radiotracer uptake by the liver and clearance of the blood pool. Prompt visualization of the CBD by 10 minutes. Normal appearing CBD, duodenum, and progressive small bowel activity between ten and 40 minutes into the exam. No abnormal radiotracer accumulation identified on those images. However, abruptly beginning on the 45 minute image there is abundant radiotracer activity within the percutaneous drain (image 52) which mildly increases until the study was concluded at 55 minutes. IMPRESSION: Positive for biliary leak: Abundant radiotracer activity within the percutaneous drain beginning at the 45 minutes mark of this exam. This was discussed by telephone with PA LINDSEY LAYDEN in the ED on 11/23/2018 at 16:19 . Normal appearing CBD and small bowel  activity. Electronically Signed   By: Genevie Ann M.D.   On: 11/23/2018 16:20    Anti-infectives: Anti-infectives (From admission, onward)   Start     Dose/Rate Route Frequency Ordered Stop   11/25/18 1200  piperacillin-tazobactam (ZOSYN) IVPB 3.375 g     3.375 g 12.5 mL/hr over 240 Minutes Intravenous 30 min pre-op 11/24/18 1425         Assessment/Plan ESRD - per renal HTN DM COPD CAD A fib  Postoperative bile leak status post laparoscopic cholecystectomy for severe chronic cholecystitis  -GI plans for ERCP today hopefully.   Has HD today as well -NPO for procedure.  Ok to eat from our standpoint either after procedure or if no procedure planned for today.  FEN - NPO VTE - Lovenox ID - [email or web address]   LOS: 2 days    Henreitta Cea , [facility] 11/25/2018, 8:27 AM Pager: [phone]

## 2018-11-25 NOTE — Progress Notes (Addendum)
[facility] Progress Note   Subjective: NPO awaiting ERCP per GI. Very pleasant, no C/Os pain.   Objective Vitals:   11/24/18 1513 11/24/18 2155 11/25/18 0515 11/25/18 0628  BP: 133/80 133/70 (!) 143/55   Pulse: 80 75 78 86  Resp: 18 15 18    Temp: 98.1 F (36.7 C) 98.4 F (36.9 C) 98.2 F (36.8 C)   TempSrc: Oral Oral Oral   SpO2: 98% 97% (!) 87% 94%  Weight:      Height:       Physical Exam General: Obese elderly male in NAD Heart: S1,S2, RRR Lungs: CTAB Abdomen: S, tender RUQ drain in place with bilious output. Multiple healing incisions from lap chole.  Extremities: No LE edema Dialysis Access: L AVF + bruit   Additional Objective Labs: Basic Metabolic Panel: Recent Labs  Lab 11/23/18 1231 11/23/18 2248 11/24/18 0219 11/25/18 0145  NA 139  --  140 141  K 3.7  --  3.6 3.7  CL 97*  --  98 100  CO2 31  --  28 28  GLUCOSE 101*  --  156* 79  BUN 21  --  30* 37*  CREATININE 6.03*  --  7.07* 8.34*  CALCIUM 9.3  --  9.1 8.7*  PHOS  --  4.1 4.4  --    Liver Function Tests: Recent Labs  Lab 11/23/18 1231 11/24/18 0219 11/25/18 0145  AST 17 14* 13*  ALT 13 13 11   ALKPHOS 144* 129* 135*  BILITOT 0.2* 0.5 0.5  PROT 6.4* 6.0* 6.0*  ALBUMIN 2.9* 2.9* 2.7*   Recent Labs  Lab 11/23/18 1231  LIPASE 21   CBC: Recent Labs  Lab 11/23/18 1231 11/24/18 0219  WBC 7.7 8.3  NEUTROABS 5.3  --   HGB 11.1* 10.5*  HCT 37.9* 34.1*  MCV 92.7 91.9  PLT 395 407*   Blood Culture    Component Value Date/Time   SDES BILE 08/19/2018 1620   SPECREQUEST NONE 08/19/2018 1620   CULT  08/19/2018 1620    No growth aerobically or anaerobically. Performed at [facility], [place] [address], [place], [place] [address]    REPTSTATUS 08/25/2018 FINAL 08/19/2018 1620    Cardiac Enzymes: No results for input(s): CKTOTAL, CKMB, CKMBINDEX, TROPONINI in the last 168 hours. CBG: Recent Labs  Lab 11/24/18 2035 11/25/18 0046 11/25/18 0420 11/25/18 0626  11/25/18 0854  GLUCAP 103* 94 75 85 90   Iron Studies: No results for input(s): IRON, TIBC, TRANSFERRIN, FERRITIN in the last 72 hours. @lablastinr3 @ Studies/Results: Nm Hepatobiliary Including Gb  Result Date: 11/23/2018 CLINICAL DATA:  78 year old male with chronic cholecystitis percutaneous cholecystostomy earlier this year but then status post laparoscopic cholecystectomy on 11/14/2018 with a 23 [place] Blake drain placed into the gallbladder fossa at the time of surgery. Increasing black and greenish fluid from the drain over the past 2 days. EXAM: NUCLEAR MEDICINE HEPATOBILIARY IMAGING TECHNIQUE: Sequential images of the abdomen were obtained out to 60 minutes following intravenous administration of radiopharmaceutical. RADIOPHARMACEUTICALS:  Five mCi Tc-46m  Choletec IV COMPARISON:  CT Abdomen and Pelvis 11/11/2018 FINDINGS: Prompt radiotracer uptake by the liver and clearance of the blood pool. Prompt visualization of the CBD by 10 minutes. Normal appearing CBD, duodenum, and progressive small bowel activity between ten and 40 minutes into the exam. No abnormal radiotracer accumulation identified on those images. However, abruptly beginning on the 45 minute image there is abundant radiotracer activity within the percutaneous drain (image 52) which mildly increases  until the study was concluded at 55 minutes. IMPRESSION: Positive for biliary leak: Abundant radiotracer activity within the percutaneous drain beginning at the 45 minutes mark of this exam. This was discussed by telephone with PA LINDSEY LAYDEN in the ED on 11/23/2018 at 16:19 . Normal appearing CBD and small bowel activity. Electronically Signed   By: Genevie Ann M.D.   On: 11/23/2018 16:20   Medications: . sodium chloride 10 mL/hr at 11/24/18 0700  . piperacillin-tazobactam (ZOSYN)  IV     . atorvastatin  20 mg Oral QPM  . buPROPion  150 mg Oral QHS  . Chlorhexidine Gluconate Cloth  6 each Topical Q0600  . docusate sodium  100 mg  Oral BID  . enoxaparin (LOVENOX) injection  30 mg Subcutaneous Q24H  . escitalopram  10 mg Oral QPM  . gabapentin  300 mg Oral QHS  . insulin aspart  0-15 Units Subcutaneous TID WC  . insulin detemir  8 Units Subcutaneous BID  . lactulose  10 g Oral QAC supper  . levothyroxine  125 mcg Oral Q0600  . linaclotide  290 mcg Oral QPC supper  . loratadine  10 mg Oral QPM  . midodrine  2.5 mg Oral TID WC  . montelukast  10 mg Oral QPM  . sevelamer carbonate  2,400 mg Oral BID WC   HD orders: [facility] T[facility] 4hr 2/2.25 180NRe     EDW 118 kg Heparin 5000 unit bolus, 2500 mid tx Hb 10.8 11/18/18  Last Mircera 19mcg (11/22/18) Calcitriol 43mcg PO TIW PTH 432 09/16/2018   Assessment/Plan: 1.  Post Op Bile Leak-Confirmed per HIDA scan.Per primary. Surgery consulted. For ERCP today per GI.  2. ESRD - T,Th,S [place]. For HD today on schedule. Use 4.0 K bath-K+ 3.7 3. Anemia - HGB 10.5. Recent ESA dose. Follow HGB.  4. Secondary hyperparathyroidism - NPO at present. Resume binders, oral VDRA when eating. Hectorol 3 mcg IV with HD today.  5. HTN/volume -BP stable. 2-3 liters with HD today. On midodrine for chronic hypotension.  6. Nutrition -Albumin 2.7. NPO at present. Renal/Carb mod diet, prostat, renal vit when eating.  7. DM-per primary 8. Hypothyroidism-per primary 9. OSA-CPAP per primary   H.  NP-C 11/25/2018, 11:55 AM  [employer] [phone]

## 2018-11-26 ENCOUNTER — Other Ambulatory Visit: Payer: Self-pay

## 2018-11-26 ENCOUNTER — Encounter (HOSPITAL_COMMUNITY): Payer: Self-pay | Admitting: Nephrology

## 2018-11-26 DIAGNOSIS — E119 Type 2 diabetes mellitus without complications: Secondary | ICD-10-CM

## 2018-11-26 LAB — RENAL FUNCTION PANEL
Albumin: 3 g/dL — ABNORMAL LOW (ref 3.5–5.0)
Anion gap: 15 (ref 5–15)
BUN: 24 mg/dL — ABNORMAL HIGH (ref 8–23)
CO2: 28 mmol/L (ref 22–32)
Calcium: 8.7 mg/dL — ABNORMAL LOW (ref 8.9–10.3)
Chloride: 98 mmol/L (ref 98–111)
Creatinine, Ser: 6.41 mg/dL — ABNORMAL HIGH (ref 0.61–1.24)
GFR calc Af Amer: 9 mL/min — ABNORMAL LOW (ref >60)
GFR calc non Af Amer: 8 mL/min — ABNORMAL LOW (ref >60)
Glucose, Bld: 144 mg/dL — ABNORMAL HIGH (ref 70–99)
Phosphorus: 3.8 mg/dL (ref 2.5–4.6)
Potassium: 3.9 mmol/L (ref 3.5–5.1)
Sodium: 141 mmol/L (ref 135–145)

## 2018-11-26 LAB — CBC
HCT: 39.5 % (ref 39.0–52.0)
Hemoglobin: 11.7 g/dL — ABNORMAL LOW (ref 13.0–17.0)
MCH: 27 pg (ref 26.0–34.0)
MCHC: 29.6 g/dL — ABNORMAL LOW (ref 30.0–36.0)
MCV: 91.2 fL (ref 80.0–100.0)
Platelets: 408 10*3/uL — ABNORMAL HIGH (ref 150–400)
RBC: 4.33 MIL/uL (ref 4.22–5.81)
RDW: 17.5 % — ABNORMAL HIGH (ref 11.5–15.5)
WBC: 14 10*3/uL — ABNORMAL HIGH (ref 4.0–10.5)
nRBC: 0 % (ref 0.0–0.2)

## 2018-11-26 LAB — GLUCOSE, CAPILLARY
Glucose-Capillary: 111 mg/dL — ABNORMAL HIGH (ref 70–99)
Glucose-Capillary: 175 mg/dL — ABNORMAL HIGH (ref 70–99)
Glucose-Capillary: 103 mg/dL — ABNORMAL HIGH (ref 70–99)
Glucose-Capillary: 188 mg/dL — ABNORMAL HIGH (ref 70–99)

## 2018-11-26 NOTE — Progress Notes (Signed)
Patient ID: Christian Dalton, male   DOB: 01/05/1941, 78 y.o.   MRN: [id number]    1 Day Post-Op  Subjective: Pleasant this morning. Reports he is pain free. Tolerating po fluids and supper yesterday. No N/V. Had BM yesterday evening. Passing flatus.   Objective: Vital signs in last 24 hours: Temp:  [97.9 F (36.6 C)-98.9 F (37.2 C)] 98.2 F (36.8 C) (01/03 0432) Pulse Rate:  [71-93] 89 (01/03 0432) Resp:  [12-21] 19 (01/03 0432) BP: (84-156)/(35-83) 123/73 (01/03 0432) SpO2:  [91 %-100 %] 95 % (01/03 0432) Weight:  [115.4 kg-117.9 kg] 115.4 kg (01/02 2353) Last BM Date: 11/25/18  Intake/Output from previous day: 01/02 0701 - 01/03 0700 In: 690 [P.O.:290; I.V.:400] Out: 2588 [Drains:105] Intake/Output this shift: No intake/output data recorded.  PE: Abd: Soft, obese, +BS, non-distended, non-tender. Drain in place with ~25cc of output. 105cc documented from the drain yesterday. Incisions c/d/i.   Lab Results:  Recent Labs    11/25/18 2012 11/26/18 0146  WBC 10.8* 14.0*  HGB 10.4* 11.7*  HCT 34.6* 39.5  PLT 413* 408*   BMET Recent Labs    11/25/18 0145 11/26/18 0146  NA 141 141  K 3.7 3.9  CL 100 98  CO2 28 28  GLUCOSE 79 144*  BUN 37* 24*  CREATININE 8.34* 6.41*  CALCIUM 8.7* 8.7*   PT/INR Recent Labs    11/23/18 2248  LABPROT 13.8  INR 1.07   CMP     Component Value Date/Time   NA 141 11/26/2018 0146   K 3.9 11/26/2018 0146   CL 98 11/26/2018 0146   CO2 28 11/26/2018 0146   GLUCOSE 144 (H) 11/26/2018 0146   BUN 24 (H) 11/26/2018 0146   CREATININE 6.41 (H) 11/26/2018 0146   CALCIUM 8.7 (L) 11/26/2018 0146   PROT 6.0 (L) 11/25/2018 0145   ALBUMIN 3.0 (L) 11/26/2018 0146   AST 13 (L) 11/25/2018 0145   ALT 11 11/25/2018 0145   ALKPHOS 135 (H) 11/25/2018 0145   BILITOT 0.5 11/25/2018 0145   GFRNONAA 8 (L) 11/26/2018 0146   GFRAA 9 (L) 11/26/2018 0146   Lipase     Component Value Date/Time   LIPASE 21 11/23/2018 1231        Studies/Results: Dg Ercp Biliary & Pancreatic Ducts  Result Date: 11/25/2018 CLINICAL DATA:  78 year old male with bile leak EXAM: ERCP TECHNIQUE: Multiple spot images obtained with the fluoroscopic device and submitted for interpretation post-procedure. FLUOROSCOPY TIME:  Fluoroscopy Time:  3 minutes 50 seconds reported COMPARISON:  CT scan of the abdomen and pelvis 11/11/2018 FINDINGS: A total of 5 intraoperative spot images are submitted for review. The images demonstrate a flexible endoscope in the descending duodenum with wire cannulation of both the main pancreatic duct and common bile duct followed by cholangiogram. Cholangiogram demonstrates contrast extravasation from the cystic duct remnant. The final image documents placement of a plastic biliary stent. Incompletely imaged percutaneous drainage catheter in the region of the gallbladder fossa. IMPRESSION: 1. Cholangiogram demonstrates bile leak arising from the cystic duct remnant. 2. Placement of a plastic biliary stent. These images were submitted for radiologic interpretation only. Please see the procedural report for the amount of contrast and the fluoroscopy time utilized. Electronically Signed   By: Jacqulynn Cadet M.D.   On: 11/25/2018 15:40    Anti-infectives: Anti-infectives (From admission, onward)   Start     Dose/Rate Route Frequency Ordered Stop   11/25/18 1200  piperacillin-tazobactam (ZOSYN) IVPB 3.375 g  3.375 g 12.5 mL/hr over 240 Minutes Intravenous 30 min pre-op 11/24/18 1425 11/25/18 1731       Assessment/Plan ESRD- Per renal. Dialysis yesterday.  HTN DM COPD CAD A fib  Postoperative bile leak status post laparoscopic cholecystectomy for severe chronic cholecystitis -s/p ERCP, sphincterotomy and plastic stent placement by GI yesterday. -No pain or nausea. Tolerating PO's. BM yesterday. Passing flatus.  -105cc documented output from drain yesterday. Will plan to keep JP drain in. Follow up in  office with Dr. Ninfa Linden  -Surgically stable for discharge.   FEN -Renal diet VTE -Lovenox ID -zosyn. Doesn't need further from our standpoint.   LOS: 3 days    Jillyn Ledger , [facility] 11/26/2018, 8:17 AM Pager: [phone]

## 2018-11-26 NOTE — Progress Notes (Addendum)
HD tx ended 55 min early  @ 2305 d/t pt stating upon arrival to unit that he was only running 3 hrs,  w/ n/v and then low bp UF goal met Blood rinsed back VSS Report called to [place], [place] Dr. Augustin Coupe made aware of pt coming off early

## 2018-11-26 NOTE — Progress Notes (Signed)
Patient refuses CPAP, doesn't wear at home and doesn't want to here.

## 2018-11-26 NOTE — Progress Notes (Signed)
Subjective: The patient was seen and examined at bedside. Complains of mild epigastric abdominal pain, tolerated solid diet for breakfast, denies nausea or vomiting.  Objective: Vital signs in last 24 hours: Temp:  [97.9 F (36.6 C)-98.9 F (37.2 C)] 98.6 F (37 C) (01/03 1036) Pulse Rate:  [71-93] 82 (01/03 1036) Resp:  [12-21] 20 (01/03 1036) BP: (84-156)/(35-83) 118/58 (01/03 1036) SpO2:  [91 %-100 %] 95 % (01/03 1036) Weight:  [115.4 kg-117.9 kg] 115.4 kg (01/02 2353) Weight change:  Last BM Date: 11/25/18  PE:not in distress, lying comfortably on bed GENERAL:no pallor, no icterus ABDOMEN:soft, JP drain with minimal bilious output, normoactive bowel sounds, nondistended, nontender EXTREMITIES:no deformity  Lab Results: Results for orders placed or performed during the hospital encounter of 11/23/18 (from the past 48 hour(s))  Glucose, capillary     Status: None   Collection Time: 11/24/18 12:38 PM  Result Value Ref Range   Glucose-Capillary 99 70 - 99 mg/dL  Glucose, capillary     Status: None   Collection Time: 11/24/18  5:50 PM  Result Value Ref Range   Glucose-Capillary 97 70 - 99 mg/dL  Glucose, capillary     Status: Abnormal   Collection Time: 11/24/18  8:35 PM  Result Value Ref Range   Glucose-Capillary 103 (H) 70 - 99 mg/dL   Comment 1 Notify RN    Comment 2 Document in Chart   Glucose, capillary     Status: None   Collection Time: 11/25/18 12:46 AM  Result Value Ref Range   Glucose-Capillary 94 70 - 99 mg/dL   Comment 1 Notify RN    Comment 2 Document in Chart   Comprehensive metabolic panel     Status: Abnormal   Collection Time: 11/25/18  1:45 AM  Result Value Ref Range   Sodium 141 135 - 145 mmol/L   Potassium 3.7 3.5 - 5.1 mmol/L   Chloride 100 98 - 111 mmol/L   CO2 28 22 - 32 mmol/L   Glucose, Bld 79 70 - 99 mg/dL   BUN 37 (H) 8 - 23 mg/dL   Creatinine, Ser 8.34 (H) 0.61 - 1.24 mg/dL   Calcium 8.7 (L) 8.9 - 10.3 mg/dL   Total Protein 6.0 (L)  6.5 - 8.1 g/dL   Albumin 2.7 (L) 3.5 - 5.0 g/dL   AST 13 (L) 15 - 41 U/L   ALT 11 0 - 44 U/L   Alkaline Phosphatase 135 (H) 38 - 126 U/L   Total Bilirubin 0.5 0.3 - 1.2 mg/dL   GFR calc non Af Amer 6 (L) >60 mL/min   GFR calc Af Amer 7 (L) >60 mL/min   Anion gap 13 5 - 15    Comment: Performed at [facility], [address], [place], [place] [address]  Glucose, capillary     Status: None   Collection Time: 11/25/18  4:20 AM  Result Value Ref Range   Glucose-Capillary 75 70 - 99 mg/dL   Comment 1 Notify RN    Comment 2 Document in Chart   Glucose, capillary     Status: None   Collection Time: 11/25/18  6:26 AM  Result Value Ref Range   Glucose-Capillary 85 70 - 99 mg/dL  Glucose, capillary     Status: None   Collection Time: 11/25/18  8:54 AM  Result Value Ref Range   Glucose-Capillary 90 70 - 99 mg/dL  Glucose, capillary     Status: None   Collection Time: 11/25/18  1:12  PM  Result Value Ref Range   Glucose-Capillary 87 70 - 99 mg/dL  Glucose, capillary     Status: Abnormal   Collection Time: 11/25/18  5:19 PM  Result Value Ref Range   Glucose-Capillary 148 (H) 70 - 99 mg/dL  CBC     Status: Abnormal   Collection Time: 11/25/18  8:12 PM  Result Value Ref Range   WBC 10.8 (H) 4.0 - 10.5 K/uL   RBC 3.82 (L) 4.22 - 5.81 MIL/uL   Hemoglobin 10.4 (L) 13.0 - 17.0 g/dL   HCT 34.6 (L) 39.0 - 52.0 %   MCV 90.6 80.0 - 100.0 fL   MCH 27.2 26.0 - 34.0 pg   MCHC 30.1 30.0 - 36.0 g/dL   RDW 17.5 (H) 11.5 - 15.5 %   Platelets 413 (H) 150 - 400 K/uL   nRBC 0.0 0.0 - 0.2 %    Comment: Performed at [facility], [place] [address], [place], [place] [address]  Glucose, capillary     Status: Abnormal   Collection Time: 11/26/18  1:01 AM  Result Value Ref Range   Glucose-Capillary 111 (H) 70 - 99 mg/dL  Renal function panel     Status: Abnormal   Collection Time: 11/26/18  1:46 AM  Result Value Ref Range   Sodium 141 135 - 145 mmol/L   Potassium 3.9 3.5 - 5.1 mmol/L    Chloride 98 98 - 111 mmol/L   CO2 28 22 - 32 mmol/L   Glucose, Bld 144 (H) 70 - 99 mg/dL   BUN 24 (H) 8 - 23 mg/dL   Creatinine, Ser 6.41 (H) 0.61 - 1.24 mg/dL   Calcium 8.7 (L) 8.9 - 10.3 mg/dL   Phosphorus 3.8 2.5 - 4.6 mg/dL   Albumin 3.0 (L) 3.5 - 5.0 g/dL   GFR calc non Af Amer 8 (L) >60 mL/min   GFR calc Af Amer 9 (L) >60 mL/min   Anion gap 15 5 - 15    Comment: Performed at [facility] [address] [place], [place] [address]  CBC     Status: Abnormal   Collection Time: 11/26/18  1:46 AM  Result Value Ref Range   WBC 14.0 (H) 4.0 - 10.5 K/uL   RBC 4.33 4.22 - 5.81 MIL/uL   Hemoglobin 11.7 (L) 13.0 - 17.0 g/dL   HCT 39.5 39.0 - 52.0 %   MCV 91.2 80.0 - 100.0 fL   MCH 27.0 26.0 - 34.0 pg   MCHC 29.6 (L) 30.0 - 36.0 g/dL   RDW 17.5 (H) 11.5 - 15.5 %   Platelets 408 (H) 150 - 400 K/uL   nRBC 0.0 0.0 - 0.2 %    Comment: Performed at [facility] [address], [place], [place] [address]  Glucose, capillary     Status: Abnormal   Collection Time: 11/26/18  4:28 AM  Result Value Ref Range   Glucose-Capillary 175 (H) 70 - 99 mg/dL  Glucose, capillary     Status: Abnormal   Collection Time: 11/26/18  7:48 AM  Result Value Ref Range   Glucose-Capillary 103 (H) 70 - 99 mg/dL   Comment 1 Notify RN   Glucose, capillary     Status: Abnormal   Collection Time: 11/26/18 11:39 AM  Result Value Ref Range   Glucose-Capillary 188 (H) 70 - 99 mg/dL   Comment 1 Notify RN     Studies/Results: Dg Ercp Biliary & Pancreatic Ducts  Result Date: 11/25/2018  CLINICAL DATA:  78 year old male with bile leak EXAM: ERCP TECHNIQUE: Multiple spot images obtained with the fluoroscopic device and submitted for interpretation post-procedure. FLUOROSCOPY TIME:  Fluoroscopy Time:  3 minutes 50 seconds reported COMPARISON:  CT scan of the abdomen and pelvis 11/11/2018 FINDINGS: A total of 5 intraoperative spot images are submitted for review. The images demonstrate a flexible endoscope in  the descending duodenum with wire cannulation of both the main pancreatic duct and common bile duct followed by cholangiogram. Cholangiogram demonstrates contrast extravasation from the cystic duct remnant. The final image documents placement of a plastic biliary stent. Incompletely imaged percutaneous drainage catheter in the region of the gallbladder fossa. IMPRESSION: 1. Cholangiogram demonstrates bile leak arising from the cystic duct remnant. 2. Placement of a plastic biliary stent. These images were submitted for radiologic interpretation only. Please see the procedural report for the amount of contrast and the fluoroscopy time utilized. Electronically Signed   By: Jacqulynn Cadet M.D.   On: 11/25/2018 15:40    Medications: I have reviewed the patient's current medications.  Assessment: Post ERCP,sphincterotomy and plastic stent placement for bile leak  Plan: Okay to discharge from GI standpoint. Will need stent removal as an outpatient in the next 4-8 weeks, depending upon drainage from JP output, and healing of bile leak. Patient to follow up with surgery as an outpatient, after removal of JP drain, scheduled for a plastic CBD stent removal. Patient understands and verbalizes consent   [name] 11/26/2018, 12:09 PM   Pager [phone] If no answer or after 5 PM call [phone]

## 2018-11-26 NOTE — Progress Notes (Signed)
[facility] Progress Note   Subjective:  S/o HD early last night. BP dropped and felt nauseated.  Net UF 2.4L  Seen in room.  No complaints this morning. Ate breakfast. Denies N/V/Abd pain. Wants to go home.    Objective Vitals:   11/25/18 2353 11/26/18 0219 11/26/18 0230 11/26/18 0432  BP: 127/68 (!) 94/35 (!) 97/45 123/73  Pulse: 84 91  89  Resp: 16 18  19   Temp: 97.9 F (36.6 C) 98.2 F (36.8 C)  98.2 F (36.8 C)  TempSrc: Oral Oral  Oral  SpO2: 96% 96%  95%  Weight: 115.4 kg     Height:       Physical Exam General: Obese elderly male in NAD Heart: S1,S2, RRR Lungs: CTAB Abdomen: S, tender JP drain in place . Multiple healing incisions from lap chole.  Extremities: No LE edema Dialysis Access: L AVF + bruit   Additional Objective Labs: Basic Metabolic Panel: Recent Labs  Lab 11/23/18 2248 11/24/18 0219 11/25/18 0145 11/26/18 0146  NA  --  140 141 141  K  --  3.6 3.7 3.9  CL  --  98 100 98  CO2  --  28 28 28   GLUCOSE  --  156* 79 144*  BUN  --  30* 37* 24*  CREATININE  --  7.07* 8.34* 6.41*  CALCIUM  --  9.1 8.7* 8.7*  PHOS 4.1 4.4  --  3.8   Liver Function Tests: Recent Labs  Lab 11/23/18 1231 11/24/18 0219 11/25/18 0145 11/26/18 0146  AST 17 14* 13*  --   ALT 13 13 11   --   ALKPHOS 144* 129* 135*  --   BILITOT 0.2* 0.5 0.5  --   PROT 6.4* 6.0* 6.0*  --   ALBUMIN 2.9* 2.9* 2.7* 3.0*   Recent Labs  Lab 11/23/18 1231  LIPASE 21   CBC: Recent Labs  Lab 11/23/18 1231 11/24/18 0219 11/25/18 2012 11/26/18 0146  WBC 7.7 8.3 10.8* 14.0*  NEUTROABS 5.3  --   --   --   HGB 11.1* 10.5* 10.4* 11.7*  HCT 37.9* 34.1* 34.6* 39.5  MCV 92.7 91.9 90.6 91.2  PLT 395 407* 413* 408*   Blood Culture    Component Value Date/Time   SDES BILE 08/19/2018 1620   SPECREQUEST NONE 08/19/2018 1620   CULT  08/19/2018 1620    No growth aerobically or anaerobically. Performed at [facility], [place] [address] [place], [place]  [address]    REPTSTATUS 08/25/2018 FINAL 08/19/2018 1620    Cardiac Enzymes: No results for input(s): CKTOTAL, CKMB, CKMBINDEX, TROPONINI in the last 168 hours. CBG: Recent Labs  Lab 11/25/18 1312 11/25/18 1719 11/26/18 0101 11/26/18 0428 11/26/18 0748  GLUCAP 87 148* 111* 175* 103*   Iron Studies: No results for input(s): IRON, TIBC, TRANSFERRIN, FERRITIN in the last 72 hours. @lablastinr3 @ Studies/Results: Dg Ercp Biliary & Pancreatic Ducts  Result Date: 11/25/2018 CLINICAL DATA:  78 year old male with bile leak EXAM: ERCP TECHNIQUE: Multiple spot images obtained with the fluoroscopic device and submitted for interpretation post-procedure. FLUOROSCOPY TIME:  Fluoroscopy Time:  3 minutes 50 seconds reported COMPARISON:  CT scan of the abdomen and pelvis 11/11/2018 FINDINGS: A total of 5 intraoperative spot images are submitted for review. The images demonstrate a flexible endoscope in the descending duodenum with wire cannulation of both the main pancreatic duct and common bile duct followed by cholangiogram. Cholangiogram demonstrates contrast extravasation from the cystic duct remnant. The final  image documents placement of a plastic biliary stent. Incompletely imaged percutaneous drainage catheter in the region of the gallbladder fossa. IMPRESSION: 1. Cholangiogram demonstrates bile leak arising from the cystic duct remnant. 2. Placement of a plastic biliary stent. These images were submitted for radiologic interpretation only. Please see the procedural report for the amount of contrast and the fluoroscopy time utilized. Electronically Signed   By: Jacqulynn Cadet M.D.   On: 11/25/2018 15:40   Medications: . sodium chloride Stopped (11/25/18 1500)  . sodium chloride    . sodium chloride     . atorvastatin  20 mg Oral QPM  . buPROPion  150 mg Oral QHS  . Chlorhexidine Gluconate Cloth  6 each Topical Q0600  . docusate sodium  100 mg Oral BID  . enoxaparin (LOVENOX) injection  30 mg  Subcutaneous Q24H  . escitalopram  10 mg Oral QPM  . feeding supplement (PRO-STAT SUGAR FREE 64)  30 mL Oral BID  . gabapentin  300 mg Oral QHS  . insulin aspart  0-15 Units Subcutaneous TID WC  . insulin detemir  8 Units Subcutaneous BID  . lactulose  10 g Oral QAC supper  . levothyroxine  125 mcg Oral Q0600  . linaclotide  290 mcg Oral QPC supper  . loratadine  10 mg Oral QPM  . midodrine  2.5 mg Oral TID WC  . montelukast  10 mg Oral QPM  . multivitamin  1 tablet Oral QHS  . sevelamer carbonate  2,400 mg Oral BID WC   HD orders:  TTS 4hr 2/2.25 180NRe     EDW 118 kg Heparin 5000 unit bolus, 2500 mid tx Hb 10.8 11/18/18  Last Mircera 37mcg (11/22/18) Calcitriol 82mcg PO TIW PTH 432 09/16/2018   Assessment/Plan: 1.  Post Op Bile Leak-s/p ERCP, sphincterotomy and plastic stent placement 11/25/18 per GI.  2. ESRD - HD TTS. Continue on schedule. Next HD Saturday.  3. Anemia - Hgb 10.5. Recent ESA dose. Follow HGB.  4. Secondary hyperparathyroidism -  Ca/Phosk ok. Continue binders/VDRA.  5. HTN/volume - On midodrine for chronic hypotension. Net UF 2.4> Post HD wt 1/2 115.4kg. Got below EDW but BP dropped. Gradually lower as outpatient.  6. Nutrition -Low albumin. Renal/Carb mod diet, prostat, renal vit. 7. DM-per primary 8. Hypothyroidism-per primary 9. OSA-CPAP per primary  Lynnda Child PA-C [place] Pager [phone] 11/26/2018,9:52 AM

## 2018-11-26 NOTE — Discharge Summary (Signed)
Physician Discharge Summary  Patient ID: Wael Maestas MRN: [id number] DOB/AGE: January 17, 1941 78 y.o.  Admit date: 11/23/2018 Discharge date: 11/26/2018  Admission Diagnoses: Active Problems:   Bile leak, postoperative   ESRD (end stage renal disease) on dialysis (HCC)   DM2 (diabetes mellitus, type 2) (HCC)   HTN (hypertension)   CAD (coronary artery disease)   Obesity, Class III, BMI 40-49.9 (morbid obesity) ([facility])  Discharge Diagnoses:  Active Problems:   Bile leak, postoperative   ESRD (end stage renal disease) on dialysis (HCC)   DM2 (diabetes mellitus, type 2) (HCC)   HTN (hypertension)   CAD (coronary artery disease)   Obesity, Class III, BMI 40-49.9 (morbid obesity) ([place])   Discharged Condition: good  Presentation Summary: Maynor Mwangi a 78 y.o.malewith medical history significant forESRD on TTS HD via LUEAVF, CAD, hypothyroidism, diabetes mellitus type 2, hypertension, undocumented COPD, OSA on CPAP and recent history as follows: - Oct 2019 admitted for sepsis and acute cholecystitis rx w/ abx and perc cholecystotomy drain 08/19/18 due to high risk for surgery; dc'd 08/24/18 - readmit 11/11/18 for acute cholangitis which was treated w/ IV abx then  laparoscopic cholecystectomy on 11/14/18 per Dr Ninfa Linden. DC's 11/15/18 Now patient presenting to the ED from surgery clinic with 3 to 4 days of mild abdominal pain and increased greenish drainage from his biliary drain. HIDA scan demonstrated the presence of a biliary leak.     Home meds:  - aspirin 81/ atorvastatin 20 hs  - midodrine 2.5 tid  - montelukast 10 hs  - bupropion 150 hs/ escitalopram 10 hs/ gabapentin 300 hs  - insulin detemir 20 am and 16 pm  - insulin humalog mis 75/25 takes 10u sq bid  - lactulose 10 gm w/ supper  - sevelamer 2.4 gm tid ac  - levothyroxine 125 ug qd    Hospital Course/ Problems:     Bile leak, postoperative - large amount of bile in drain > admitted and underwent ERCP,  sphincterotomy and plastic stent placement by Dr Therisa Doyne w/ GI; minimal drainage from pt's drain since procedure - wire accidentally placed in pancreatic duct during ERCP but no symptoms of post-ERCP pancreatitis - OK for dc per GI, will f/u w/ GI (GI will arrange) and with gen surg (appt in f/u section DC)  ESRD on HD/  anemia of CKD - TTS dialysis - per nephrology  DM2 - on Levemir and  SSI, resume the same at DC  Chronic hypotension - cont midodrine  Hypothyroid - cont Synthroid  Depression - cont Lexapro and Wellbutrin  IBS with constipation - Linzess  OSA - start CPAP at bedtime  CAD - on ASA, Lipitor   Consultants:   gen surgery  [facility] Procedures:   ERCP  Antimicrobials:             Anti-infectives (From admission, onward)   Start     Dose/Rate Route Frequency Ordered Stop   11/25/18 1200  [MAR Hold]  piperacillin-tazobactam (ZOSYN) IVPB 3.375 g     (MAR Hold since Thu 11/25/2018 at 1236. Reason: Transfer to a Procedural area.)   3.375 g 12.5 mL/hr over 240 Minutes Intravenous 30 min pre-op 11/24/18 1425            Discharge Exam: Blood pressure (!) 118/58, pulse 82, temperature 98.6 F (37 C), temperature source Oral, resp. rate 20, height 5\' 10"  (1.778 m), weight 115.4 kg, SpO2 95 %. General exam:Appears comfortable  HEENT:PERRLA, oral mucosa moist, no sclera icterus or thrush  Respiratory system: Clear to auscultation. Respiratory effort normal. Cardiovascular system:S1 &S2 heard, No murmurs  Gastrointestinal system:Abdomen soft, non-tender, nondistended. Normal bowel sound. No organomegaly. Drain RLQ in place.  Central nervous system:Alert and oriented. No focal neurological deficits. Extremities: No cyanosis, clubbing or edema Skin: No rashes or ulcers Psychiatry:Mood &affect appropriate.   Disposition:    Allergies as of 11/26/2018      Reactions   Avelox [moxifloxacin Hcl] Other (See Comments)   unknown    Shellfish Allergy Nausea And Vomiting   Stomach   Sulfa Antibiotics Other (See Comments)   unknown   Tetracyclines & Related Other (See Comments)   unknown      Medication List    TAKE these medications   aspirin EC 81 MG tablet Take 81 mg by mouth every other day.   atorvastatin 20 MG tablet Commonly known as:  LIPITOR Take 1 tablet (20 mg total) by mouth every evening. What changed:  how much to take   buPROPion 150 MG 24 hr tablet Commonly known as:  WELLBUTRIN XL Take 150 mg by mouth at bedtime.   docusate sodium 100 MG capsule Commonly known as:  COLACE Take 100 mg by mouth 2 (two) times daily.   escitalopram 10 MG tablet Commonly known as:  LEXAPRO Take 10 mg every evening by mouth.   gabapentin 300 MG capsule Commonly known as:  NEURONTIN Take 300 mg by mouth at bedtime.   HUMALOG MIX 75/25 KWIKPEN (75-25) 100 UNIT/ML Kwikpen Generic drug:  Insulin Lispro Prot & Lispro Take 10 Units by mouth 2 (two) times daily.   insulin detemir 100 UNIT/ML injection Commonly known as:  LEVEMIR Inject 0.14 mLs (14 Units total) into the skin 2 (two) times daily. What changed:    how much to take  when to take this  additional instructions   lactulose (encephalopathy) 10 GM/15ML Soln Commonly known as:  CHRONULAC Take 15 mLs by mouth daily before supper.   levothyroxine 125 MCG tablet Commonly known as:  SYNTHROID, LEVOTHROID Take 125 mcg daily by mouth.   lidocaine-prilocaine cream Commonly known as:  EMLA Apply 1 application topically See admin instructions. To access site (AVF) 1-2 hours before dialysis. Cover with occlusive dressing (Saran warp)   linaclotide 290 MCG Caps capsule Commonly known as:  LINZESS Take 290 mcg by mouth daily after supper.   loratadine 10 MG tablet Commonly known as:  CLARITIN Take 10 mg every evening by mouth.   midodrine 2.5 MG tablet Commonly known as:  PROAMATINE Take 2.5 mg by mouth 3 (three) times daily.   montelukast  10 MG tablet Commonly known as:  SINGULAIR Take 10 mg every evening by mouth.   sevelamer carbonate 800 MG tablet Commonly known as:  RENVELA Take 3 tablets (2,400 mg total) by mouth 3 (three) times daily with meals. What changed:  when to take this      Follow-up Information    Coralie Keens, MD Follow up in 2 week(s).   Specialty:  General Surgery Contact information: [address] [place] [phone]           Signed: Sol Blazing 11/26/2018, 1:51 PM

## 2018-11-26 NOTE — Care Management Important Message (Signed)
Important Message  Patient Details  Name: Christian Dalton MRN: [id number] Date of Birth: 13-Dec-1940   Medicare Important Message Given:  Yes    Javonda Suh Montine Circle 11/26/2018, 3:32 PM

## 2018-11-27 DIAGNOSIS — D509 Iron deficiency anemia, unspecified: Secondary | ICD-10-CM | POA: Diagnosis not present

## 2018-11-27 DIAGNOSIS — E1121 Type 2 diabetes mellitus with diabetic nephropathy: Secondary | ICD-10-CM | POA: Diagnosis not present

## 2018-11-27 DIAGNOSIS — N186 End stage renal disease: Secondary | ICD-10-CM | POA: Diagnosis not present

## 2018-11-27 DIAGNOSIS — N2581 Secondary hyperparathyroidism of renal origin: Secondary | ICD-10-CM | POA: Diagnosis not present

## 2018-11-28 ENCOUNTER — Encounter (HOSPITAL_COMMUNITY): Payer: Self-pay | Admitting: Gastroenterology

## 2018-11-29 DIAGNOSIS — R079 Chest pain, unspecified: Secondary | ICD-10-CM | POA: Diagnosis not present

## 2018-11-30 DIAGNOSIS — E1121 Type 2 diabetes mellitus with diabetic nephropathy: Secondary | ICD-10-CM | POA: Diagnosis not present

## 2018-11-30 DIAGNOSIS — N186 End stage renal disease: Secondary | ICD-10-CM | POA: Diagnosis not present

## 2018-11-30 DIAGNOSIS — D509 Iron deficiency anemia, unspecified: Secondary | ICD-10-CM | POA: Diagnosis not present

## 2018-11-30 DIAGNOSIS — N2581 Secondary hyperparathyroidism of renal origin: Secondary | ICD-10-CM | POA: Diagnosis not present

## 2018-12-01 ENCOUNTER — Ambulatory Visit
Admission: RE | Admit: 2018-12-01 | Discharge: 2018-12-01 | Disposition: A | Discharge status: Home / Self Care | Payer: Medicare Other | Source: Ambulatory Visit | Attending: Gastroenterology | Admitting: Gastroenterology

## 2018-12-01 ENCOUNTER — Other Ambulatory Visit: Payer: Self-pay | Admitting: Gastroenterology

## 2018-12-01 DIAGNOSIS — Z4682 Encounter for fitting and adjustment of non-vascular catheter: Secondary | ICD-10-CM | POA: Diagnosis not present

## 2018-12-01 DIAGNOSIS — Z9889 Other specified postprocedural states: Secondary | ICD-10-CM | POA: Diagnosis not present

## 2018-12-01 DIAGNOSIS — R1013 Epigastric pain: Secondary | ICD-10-CM | POA: Diagnosis not present

## 2018-12-02 DIAGNOSIS — N186 End stage renal disease: Secondary | ICD-10-CM | POA: Diagnosis not present

## 2018-12-02 DIAGNOSIS — N2581 Secondary hyperparathyroidism of renal origin: Secondary | ICD-10-CM | POA: Diagnosis not present

## 2018-12-02 DIAGNOSIS — D509 Iron deficiency anemia, unspecified: Secondary | ICD-10-CM | POA: Diagnosis not present

## 2018-12-02 DIAGNOSIS — E1121 Type 2 diabetes mellitus with diabetic nephropathy: Secondary | ICD-10-CM | POA: Diagnosis not present

## 2018-12-03 ENCOUNTER — Other Ambulatory Visit: Payer: Self-pay | Admitting: Gastroenterology

## 2018-12-03 DIAGNOSIS — G8929 Other chronic pain: Secondary | ICD-10-CM | POA: Diagnosis not present

## 2018-12-03 DIAGNOSIS — Z794 Long term (current) use of insulin: Secondary | ICD-10-CM | POA: Diagnosis not present

## 2018-12-03 DIAGNOSIS — K838 Other specified diseases of biliary tract: Secondary | ICD-10-CM | POA: Diagnosis not present

## 2018-12-03 DIAGNOSIS — M545 Low back pain: Secondary | ICD-10-CM | POA: Diagnosis not present

## 2018-12-03 DIAGNOSIS — J410 Simple chronic bronchitis: Secondary | ICD-10-CM | POA: Diagnosis not present

## 2018-12-03 DIAGNOSIS — Z992 Dependence on renal dialysis: Secondary | ICD-10-CM | POA: Diagnosis not present

## 2018-12-03 DIAGNOSIS — E1122 Type 2 diabetes mellitus with diabetic chronic kidney disease: Secondary | ICD-10-CM | POA: Diagnosis not present

## 2018-12-03 DIAGNOSIS — N186 End stage renal disease: Secondary | ICD-10-CM | POA: Diagnosis not present

## 2018-12-03 DIAGNOSIS — E1151 Type 2 diabetes mellitus with diabetic peripheral angiopathy without gangrene: Secondary | ICD-10-CM | POA: Diagnosis not present

## 2018-12-03 DIAGNOSIS — R269 Unspecified abnormalities of gait and mobility: Secondary | ICD-10-CM | POA: Diagnosis not present

## 2018-12-03 DIAGNOSIS — E1142 Type 2 diabetes mellitus with diabetic polyneuropathy: Secondary | ICD-10-CM | POA: Diagnosis not present

## 2018-12-03 DIAGNOSIS — K9189 Other postprocedural complications and disorders of digestive system: Secondary | ICD-10-CM | POA: Diagnosis not present

## 2018-12-04 DIAGNOSIS — E1121 Type 2 diabetes mellitus with diabetic nephropathy: Secondary | ICD-10-CM | POA: Diagnosis not present

## 2018-12-04 DIAGNOSIS — N186 End stage renal disease: Secondary | ICD-10-CM | POA: Diagnosis not present

## 2018-12-04 DIAGNOSIS — D509 Iron deficiency anemia, unspecified: Secondary | ICD-10-CM | POA: Diagnosis not present

## 2018-12-04 DIAGNOSIS — N2581 Secondary hyperparathyroidism of renal origin: Secondary | ICD-10-CM | POA: Diagnosis not present

## 2018-12-07 DIAGNOSIS — D509 Iron deficiency anemia, unspecified: Secondary | ICD-10-CM | POA: Diagnosis not present

## 2018-12-07 DIAGNOSIS — N2581 Secondary hyperparathyroidism of renal origin: Secondary | ICD-10-CM | POA: Diagnosis not present

## 2018-12-07 DIAGNOSIS — N186 End stage renal disease: Secondary | ICD-10-CM | POA: Diagnosis not present

## 2018-12-07 DIAGNOSIS — E1121 Type 2 diabetes mellitus with diabetic nephropathy: Secondary | ICD-10-CM | POA: Diagnosis not present

## 2018-12-09 DIAGNOSIS — E1121 Type 2 diabetes mellitus with diabetic nephropathy: Secondary | ICD-10-CM | POA: Diagnosis not present

## 2018-12-09 DIAGNOSIS — N2581 Secondary hyperparathyroidism of renal origin: Secondary | ICD-10-CM | POA: Diagnosis not present

## 2018-12-09 DIAGNOSIS — N186 End stage renal disease: Secondary | ICD-10-CM | POA: Diagnosis not present

## 2018-12-09 DIAGNOSIS — D509 Iron deficiency anemia, unspecified: Secondary | ICD-10-CM | POA: Diagnosis not present

## 2018-12-10 DIAGNOSIS — Z9049 Acquired absence of other specified parts of digestive tract: Secondary | ICD-10-CM | POA: Diagnosis not present

## 2018-12-11 DIAGNOSIS — N2581 Secondary hyperparathyroidism of renal origin: Secondary | ICD-10-CM | POA: Diagnosis not present

## 2018-12-11 DIAGNOSIS — N186 End stage renal disease: Secondary | ICD-10-CM | POA: Diagnosis not present

## 2018-12-11 DIAGNOSIS — D509 Iron deficiency anemia, unspecified: Secondary | ICD-10-CM | POA: Diagnosis not present

## 2018-12-11 DIAGNOSIS — E1121 Type 2 diabetes mellitus with diabetic nephropathy: Secondary | ICD-10-CM | POA: Diagnosis not present

## 2018-12-14 ENCOUNTER — Other Ambulatory Visit: Payer: Self-pay | Admitting: Surgery

## 2018-12-14 DIAGNOSIS — Z9049 Acquired absence of other specified parts of digestive tract: Secondary | ICD-10-CM

## 2018-12-14 DIAGNOSIS — N2581 Secondary hyperparathyroidism of renal origin: Secondary | ICD-10-CM | POA: Diagnosis not present

## 2018-12-14 DIAGNOSIS — D509 Iron deficiency anemia, unspecified: Secondary | ICD-10-CM | POA: Diagnosis not present

## 2018-12-14 DIAGNOSIS — N186 End stage renal disease: Secondary | ICD-10-CM | POA: Diagnosis not present

## 2018-12-14 DIAGNOSIS — E1121 Type 2 diabetes mellitus with diabetic nephropathy: Secondary | ICD-10-CM | POA: Diagnosis not present

## 2018-12-15 ENCOUNTER — Ambulatory Visit
Admission: RE | Admit: 2018-12-15 | Discharge: 2018-12-15 | Disposition: A | Discharge status: Home / Self Care | Payer: Medicare Other | Source: Ambulatory Visit | Attending: Surgery | Admitting: Surgery

## 2018-12-15 DIAGNOSIS — Z9049 Acquired absence of other specified parts of digestive tract: Secondary | ICD-10-CM

## 2018-12-15 DIAGNOSIS — Z9689 Presence of other specified functional implants: Secondary | ICD-10-CM | POA: Diagnosis not present

## 2018-12-16 DIAGNOSIS — D509 Iron deficiency anemia, unspecified: Secondary | ICD-10-CM | POA: Diagnosis not present

## 2018-12-16 DIAGNOSIS — E1121 Type 2 diabetes mellitus with diabetic nephropathy: Secondary | ICD-10-CM | POA: Diagnosis not present

## 2018-12-16 DIAGNOSIS — N2581 Secondary hyperparathyroidism of renal origin: Secondary | ICD-10-CM | POA: Diagnosis not present

## 2018-12-16 DIAGNOSIS — N186 End stage renal disease: Secondary | ICD-10-CM | POA: Diagnosis not present

## 2018-12-17 ENCOUNTER — Encounter (HOSPITAL_COMMUNITY): Payer: Self-pay | Admitting: *Deleted

## 2018-12-18 DIAGNOSIS — E1121 Type 2 diabetes mellitus with diabetic nephropathy: Secondary | ICD-10-CM | POA: Diagnosis not present

## 2018-12-18 DIAGNOSIS — N2581 Secondary hyperparathyroidism of renal origin: Secondary | ICD-10-CM | POA: Diagnosis not present

## 2018-12-18 DIAGNOSIS — N186 End stage renal disease: Secondary | ICD-10-CM | POA: Diagnosis not present

## 2018-12-18 DIAGNOSIS — D509 Iron deficiency anemia, unspecified: Secondary | ICD-10-CM | POA: Diagnosis not present

## 2018-12-20 ENCOUNTER — Encounter (HOSPITAL_COMMUNITY): Payer: Self-pay | Admitting: *Deleted

## 2018-12-20 ENCOUNTER — Other Ambulatory Visit: Payer: Self-pay

## 2018-12-20 ENCOUNTER — Ambulatory Visit (HOSPITAL_COMMUNITY): Payer: Medicare Other | Admitting: Anesthesiology

## 2018-12-20 ENCOUNTER — Ambulatory Visit (HOSPITAL_COMMUNITY)
Admission: RE | Admit: 2018-12-20 | Discharge: 2018-12-20 | Disposition: A | Discharge status: Home / Self Care | Payer: Medicare Other | Attending: Gastroenterology | Admitting: Gastroenterology

## 2018-12-20 ENCOUNTER — Encounter (HOSPITAL_COMMUNITY)
Admission: RE | Disposition: A | Discharge status: Home / Self Care | Payer: Self-pay | Source: Home / Self Care | Attending: Gastroenterology

## 2018-12-20 DIAGNOSIS — K319 Disease of stomach and duodenum, unspecified: Secondary | ICD-10-CM | POA: Diagnosis not present

## 2018-12-20 DIAGNOSIS — Z9049 Acquired absence of other specified parts of digestive tract: Secondary | ICD-10-CM | POA: Diagnosis not present

## 2018-12-20 DIAGNOSIS — Z794 Long term (current) use of insulin: Secondary | ICD-10-CM | POA: Diagnosis not present

## 2018-12-20 DIAGNOSIS — K5710 Diverticulosis of small intestine without perforation or abscess without bleeding: Secondary | ICD-10-CM | POA: Insufficient documentation

## 2018-12-20 DIAGNOSIS — K862 Cyst of pancreas: Secondary | ICD-10-CM | POA: Diagnosis not present

## 2018-12-20 DIAGNOSIS — I2510 Atherosclerotic heart disease of native coronary artery without angina pectoris: Secondary | ICD-10-CM | POA: Insufficient documentation

## 2018-12-20 DIAGNOSIS — Z4659 Encounter for fitting and adjustment of other gastrointestinal appliance and device: Secondary | ICD-10-CM | POA: Insufficient documentation

## 2018-12-20 DIAGNOSIS — Z881 Allergy status to other antibiotic agents status: Secondary | ICD-10-CM | POA: Insufficient documentation

## 2018-12-20 DIAGNOSIS — Z79890 Hormone replacement therapy (postmenopausal): Secondary | ICD-10-CM | POA: Insufficient documentation

## 2018-12-20 DIAGNOSIS — E1122 Type 2 diabetes mellitus with diabetic chronic kidney disease: Secondary | ICD-10-CM | POA: Diagnosis not present

## 2018-12-20 DIAGNOSIS — N186 End stage renal disease: Secondary | ICD-10-CM | POA: Insufficient documentation

## 2018-12-20 DIAGNOSIS — Z79899 Other long term (current) drug therapy: Secondary | ICD-10-CM | POA: Insufficient documentation

## 2018-12-20 DIAGNOSIS — Z992 Dependence on renal dialysis: Secondary | ICD-10-CM | POA: Diagnosis not present

## 2018-12-20 DIAGNOSIS — Z882 Allergy status to sulfonamides status: Secondary | ICD-10-CM | POA: Insufficient documentation

## 2018-12-20 DIAGNOSIS — J449 Chronic obstructive pulmonary disease, unspecified: Secondary | ICD-10-CM | POA: Insufficient documentation

## 2018-12-20 DIAGNOSIS — I120 Hypertensive chronic kidney disease with stage 5 chronic kidney disease or end stage renal disease: Secondary | ICD-10-CM | POA: Insufficient documentation

## 2018-12-20 DIAGNOSIS — K3189 Other diseases of stomach and duodenum: Secondary | ICD-10-CM | POA: Diagnosis not present

## 2018-12-20 DIAGNOSIS — K2950 Unspecified chronic gastritis without bleeding: Secondary | ICD-10-CM | POA: Diagnosis not present

## 2018-12-20 HISTORY — PX: STENT REMOVAL: SHX6421

## 2018-12-20 HISTORY — PX: BIOPSY: SHX5522

## 2018-12-20 HISTORY — PX: ESOPHAGOGASTRODUODENOSCOPY: SHX5428

## 2018-12-20 LAB — POCT I-STAT 4, (NA,K, GLUC, HGB,HCT)
Glucose, Bld: 81 mg/dL (ref 70–99)
HCT: 33 % — ABNORMAL LOW (ref 39.0–52.0)
Hemoglobin: 11.2 g/dL — ABNORMAL LOW (ref 13.0–17.0)
Potassium: 4.2 mmol/L (ref 3.5–5.1)
Sodium: 142 mmol/L (ref 135–145)

## 2018-12-20 SURGERY — EGD (ESOPHAGOGASTRODUODENOSCOPY)
Anesthesia: Monitor Anesthesia Care

## 2018-12-20 MED ORDER — PROPOFOL 500 MG/50ML IV EMUL
INTRAVENOUS | Status: DC | PRN
  Administered 2018-12-20: 75 ug/kg/min via INTRAVENOUS

## 2018-12-20 MED ORDER — SODIUM CHLORIDE 0.9 % IV SOLN: INTRAVENOUS | Status: DC

## 2018-12-20 MED ORDER — SODIUM CHLORIDE 0.9 % IV SOLN
INTRAVENOUS | Status: DC
  Administered 2018-12-20: 500 mL via INTRAVENOUS

## 2018-12-20 MED ORDER — LIDOCAINE 2% (20 MG/ML) 5 ML SYRINGE
INTRAMUSCULAR | Status: DC | PRN
  Administered 2018-12-20: 25 mg via INTRAVENOUS

## 2018-12-20 MED ORDER — PROPOFOL 10 MG/ML IV BOLUS
INTRAVENOUS | Status: AC
  Filled 2018-12-20: qty 20

## 2018-12-20 MED ORDER — PROPOFOL 10 MG/ML IV BOLUS
INTRAVENOUS | Status: DC | PRN
  Administered 2018-12-20: 40 mg via INTRAVENOUS

## 2018-12-20 NOTE — Anesthesia Postprocedure Evaluation (Signed)
Anesthesia Post Note  Patient: Aurelius Fetting  Procedure(s) Performed: ESOPHAGOGASTRODUODENOSCOPY (EGD) (N/A ) STENT REMOVAL     Patient location during evaluation: PACU Anesthesia Type: MAC Level of consciousness: awake and alert Pain management: pain level controlled Vital Signs Assessment: post-procedure vital signs reviewed and stable Respiratory status: spontaneous breathing, nonlabored ventilation and respiratory function stable Cardiovascular status: blood pressure returned to baseline and stable Postop Assessment: no apparent nausea or vomiting Anesthetic complications: no    Last Vitals:  Vitals:   12/20/18 1119 12/20/18 1129  BP: 121/69 (!) 117/49  Pulse: 77 73  Resp: 16 20  Temp: 36.7 C   SpO2: 93% 96%    Last Pain:  Vitals:   12/20/18 1129  TempSrc:   PainSc: 0-No pain                 Brennan Bailey

## 2018-12-20 NOTE — H&P (Signed)
History of Present Illness  General:  78 year old male underwent ERCP on 11/25/2022 bile leak noted on HIDA scan post cholecystectomy in 12/10 and JP drain placement. Patient initially went to percutaneous cholecystitis to me to placement, followed by cholecystectomy in 12/19. No stone or sludge was noted on cholangiogram and balloon sweep post sphincterotomy did not reveal any abnormalitis. Repeat EGD/ERCP was recommended in 4-8 weeks to removed plastic CBS stent. Patient cause in complaining of chest pain, amylase and lipase levels were drawn to r/o pancreatitis( as CBD was canulated with double wire technique, PD was never injected), normal levels of 11/29/2018. MRI/MRCP from 9/19 and 12/19 showed several subcentimeter pancreatic cysts, likely side branch IPMNs. As per previous records, last colonoscopy was in [place] about 3 years ago, one polyp was removed and a repeat was recommended in 5 years. Since, ERCP the drain dropped from 10 cc t o5 cc and then was barely there and was removed at [place] office today. He c/o cramp, spasm, hurts for a few seconds to a minute, worsens with cold fluid and causes nausea. He is able to eat well, denies vomiting, fever, denies acid reflux or heartburn, denies difficulty or pain on swallowing. He has usually 1 Bm/day with laxatives- miralax and linzess and lactulose, he is on medication for ESRD.   Current Medications  Taking   Atorvastatin Calcium 10 MG Tablet 1 tablet Orally Once a day   BuPROPion HCl ER (SR) 150 MG Tablet Extended Release 12 Hour 1 tablet in the morning Oral Once a day   Escitalopram Oxalate 10 MG Tablet 1 tablet Oral Once a day   Gabapentin 300 MG Capsule 1 tablet Oral Once a day   Humalog Mix 75/25 KwikPen(Insulin Lispro Prot & Lispro) (75-25) 100 UNIT/ML Suspension Pen-injector as directed Subcutaneous once a day   Lactulose Encephalopathy 10 GM/15ML Solution TAKE 15-30 ML BY MOUTH ONCE DAILY TO TWICE A DAY Oral Once a day   Levemir  FlexTouch(Insulin Detemir) 100 UNIT/ML Solution Pen-injector (Prior XTAV#:[id number]) Subcutaneous once a day   Lidocaine-Prilocaine 2.5-2.5 % Cream APPLY SMALL AMOUNT TO ACCESS SITE (AVF) 1 TO 2 HOURS BEFORE DIALYSIS. COVER WITH OCCLUSIVE DRESSING (SARAN WRAP) External   Linzess(linaCLOtide) 290 MCG Capsule 1 capsule at least 30 minutes before the first meal of the day on an empty stomach Oral Once a day   Midodrine HCl 2.5 MG Tablet 1 tablet Oral once a day   Montelukast Sodium 10 MG Tablet 1 tablet Oral Once a day   Sevelamer Carbonate 800 MG Tablet 1 tablet with meals Oral Three times a day   Synthroid(L-Thyroxine Sodium) 125 MCG Tablet 1 tablet in the morning on an empty stomach Oral Once a day   Medication List reviewed and reconciled with the patient    Past Medical History  Acute cholecyttitis.   ESRD (end stage renal disease) on dialysis.   HTN.   DM type II.   CAD.   COPD.   Sleep Apnea.   Spinal Stenosis.    Surgical History  Right Hand Surgery   IR DIALY SHUNT INTRO NEEDLE/INTRACATH INITIAL; left 07/2018  CHOLECYSTOSTOMY 07/2018  Shoulder Surgery; Right   TIBIA FRACTURE SURGERY    Family History  No Family History of Colon Cancer, Polyps, or Liver Disease.   Social History  General:  no Alcohol.  Tobacco use  cigarettes: Never smoked Tobacco history last updated 12/01/2018 no Recreational drug use.    Allergies  Sulfa Drugs  Tetracycline HCl   Hospitalization/Major Diagnostic Procedure  Gall bladder removed 07/2018  stent placed stayed 4 nights 11/2018   Review of Systems  GI PROCEDURE:  no Pacemaker/ AICD, no. no Artificial heart valves. no MI/heart attack. no Abnormal heart rhythm. no Angina. no CVA. Hypertension YES. no Hypotension. Asthma, COPD YES, COPD. Sleep apnea YES. no Seizure disorders. no Artificial joints. no Severe DJD. Diabetes YES, YES, type II. no Significant headaches. no Vertigo. no Depression/anxiety. no Abnormal bleeding. Kidney  Disease yes. no Liver disease, no. no Blood transfusion.  GI ROS per HPI. Negative for chest pain and shortness of breath. Negative for fatigue and weakness. Negative for bleeding.     Vital Signs  Wt 259, Wt change -5.4 lb, Ht 70, BMI 37.16, Temp 98.3, Pulse sitting 96, BP sitting 129/61.   Examination  Gastroenterology:: GENERAL APPEARANCE: Well developed, overweight, pleasant, no acute distress.  ABDOMEN No masses palpated. Liver and spleen not palpated, normal. Bowel sounds normal, Abdomen not distended, dressing over RUQ- had JP drain removed today.  EXTREMITIES: No edema.  NEURO: normal gait.  PSYCH: mood/affect normal.     Assessments   1. History of biliary duct stent placement - Z98.890 (Primary)   2. Epigastric pain - R10.13   Treatment  1. History of biliary duct stent placement  IMAGING: XR ABDOMEN 1V IMAGING: Esophagoscopy Notes: Will get XRay of abdomen to ensure that CBD stent has not migrated. Normal amylase and lipase, no featrures of pancreatitis. Will arrange for an EGD at [facility] in 3-4 weeks to remove plastic CBD stent.  Referral MP:[facility] Reason:XR abd 1 view-pt will walk in to have    2. Epigastric pain  Start Pantoprazole Sodium Tablet Delayed Release, 40 MG, 1 tablet, Orally, Once a day, 30 day(s), 30, Refills 0 Notes: Will advise patient to take PPI for 30 days. Meanwhile, will get xray of abdomen to r/o stent migration.

## 2018-12-20 NOTE — Op Note (Signed)
[facility] Patient Name: Christian Dalton Procedure Date: 12/20/2018 MRN: [id number] Attending MD: [name] , MD Date of Birth: 05-03-1941 CSN: [id number] Age: 78 Admit Type: Outpatient Procedure:                Upper GI endoscopy Indications:              Biliary stent removal Providers:                [name], MD, Cleda Daub, RN, Christian Dalton,                            Technician Referring MD:              Medicines:                Monitored Anesthesia Care Complications:            No immediate complications. Estimated blood loss:                            None. Estimated Blood Loss:     Estimated blood loss: none. Procedure:                Pre-Anesthesia Assessment:                           - Prior to the procedure, a History and Physical                            was performed, and patient medications and                            allergies were reviewed. The patient's tolerance of                            previous anesthesia was also reviewed. The risks                            and benefits of the procedure and the sedation                            options and risks were discussed with the patient.                            All questions were answered, and informed consent                            was obtained. Prior Anticoagulants: The patient has                            taken aspirin, last dose was 5 days prior to                            procedure. ASA Grade Assessment: IV - A patient                            with  severe systemic disease that is a constant                            threat to life. After reviewing the risks and                            benefits, the patient was deemed in satisfactory                            condition to undergo the procedure.                           After obtaining informed consent, the endoscope was                            passed under direct vision. Throughout the         procedure, the patient's blood pressure, pulse, and                            oxygen saturations were monitored continuously. The                            [id number]-[id number] ([id number]) Olympus gastroscope was                            introduced through the mouth, and advanced to the                            second part of duodenum. The upper GI endoscopy was                            accomplished without difficulty. The patient                            tolerated the procedure well. Scope In: Scope Out: Findings:      The examined esophagus was normal.      The Z-line was regular and was found 39 cm from the incisors.      Diffuse mildly erythematous mucosa without bleeding was found in the       gastric body, at the incisura, in the gastric antrum, in the prepyloric       region of the stomach and at the pylorus. Biopsies were taken with a       cold forceps for Helicobacter pylori testing.      The cardia and gastric fundus were normal on retroflexion.      A previously placed plastic stent was seen in the second portion of the       duodenum. Stent removal was accomplished with a snare.      A small non-bleeding diverticulum was found in the second portion of the       duodenum. Impression:               - Normal esophagus.                           - Z-line  regular, 39 cm from the incisors.                           - Erythematous mucosa in the gastric body,                            incisura, antrum, prepyloric region of the stomach                            and pylorus. Biopsied.                           - Plastic stent in the duodenum. Removed.                           - Non-bleeding duodenal diverticulum. Moderate Sedation:      Patient did not receive moderate sedation for this procedure, but       instead received monitored anesthesia care. Recommendation:           - Patient has a contact number available for                            emergencies. The signs and  symptoms of potential                            delayed complications were discussed with the                            patient. Return to normal activities tomorrow.                            Written discharge instructions were provided to the                            patient.                           - Resume regular diet.                           - Continue present medications.                           - Await pathology results. Procedure Code(s):        --- Professional ---                           [phone], Esophagogastroduodenoscopy, flexible,                            transoral; with removal of foreign body(s)                           43239, Esophagogastroduodenoscopy, flexible,                            transoral; with biopsy, single  or multiple Diagnosis Code(s):        --- Professional ---                           K31.89, Other diseases of stomach and duodenum                           Z46.59, Encounter for fitting and adjustment of                            other gastrointestinal appliance and device                           K57.10, Diverticulosis of small intestine without                            perforation or abscess without bleeding CPT copyright 2018 American Medical Association. All rights reserved. The codes documented in this report are preliminary and upon coder review may  be revised to meet current compliance requirements. [name], MD 12/20/2018 11:18:14 AM This report has been signed electronically. Number of Addenda: 0

## 2018-12-20 NOTE — Brief Op Note (Signed)
12/20/2018  11:11 AM  PATIENT:  Christian Dalton  78 y.o. male  PRE-OPERATIVE DIAGNOSIS:  Removal of biliary stent  POST-OPERATIVE DIAGNOSIS:  stent removal  PROCEDURE:  Procedure(s): ESOPHAGOGASTRODUODENOSCOPY (EGD) (N/A) STENT REMOVAL  SURGEON:  Surgeon(s) and Role:    [name], MD - Primary  PHYSICIAN ASSISTANT:   ASSISTANTS: Lilli Few, Minette Brine, Tech  ANESTHESIA:   MAC  EBL:  none  BLOOD ADMINISTERED:none  DRAINS: none   LOCAL MEDICATIONS USED:  NONE  SPECIMEN:  No Specimen  DISPOSITION OF SPECIMEN:  N/A  COUNTS:  YES  TOURNIQUET:  * No tourniquets in log *  DICTATION: .Dragon Dictation  PLAN OF CARE: Discharge to home after PACU  PATIENT DISPOSITION:  PACU - hemodynamically stable.   Delay start of Pharmacological VTE agent (>24hrs) due to surgical blood loss or risk of bleeding: no

## 2018-12-20 NOTE — Interval H&P Note (Signed)
History and Physical Interval Note: 77/male for EGD with stent removal. Originally placed on 11/25/2018 for bile leak.  12/20/2018 10:52 AM  Christian Dalton  has presented today for EGD, with the diagnosis of Removal of biliary stent  The various methods of treatment have been discussed with the patient and family. After consideration of risks, benefits and other options for treatment, the patient has consented to  Procedure(s): ESOPHAGOGASTRODUODENOSCOPY (EGD) (N/A) as a surgical intervention .  The patient's history has been reviewed, patient examined, no change in status, stable for surgery.  I have reviewed the patient's chart and labs.  Questions were answered to the patient's satisfaction.     [name]

## 2018-12-20 NOTE — Anesthesia Preprocedure Evaluation (Addendum)
Anesthesia Evaluation  Patient identified by MRN, date of birth, ID band Patient awake    Reviewed: Allergy & Precautions, NPO status , Patient's Chart, lab work & pertinent test results  History of Anesthesia Complications Negative for: history of anesthetic complications  Airway Mallampati: II  TM Distance: >3 FB Neck ROM: Full    Dental  (+) Edentulous Upper, Poor Dentition,    Pulmonary sleep apnea , COPD,  COPD inhaler,    breath sounds clear to auscultation       Cardiovascular hypertension, + CAD  + dysrhythmias Atrial Fibrillation  Rhythm:Regular Rate:Normal     Neuro/Psych negative neurological ROS     GI/Hepatic negative GI ROS, Neg liver ROS,   Endo/Other  diabetes, Type 2, Insulin Dependent  Renal/GU DialysisRenal disease (HD T/Th/Sa)     Musculoskeletal negative musculoskeletal ROS (+)   Abdominal   Peds  Hematology negative hematology ROS (+)   Anesthesia Other Findings Day of surgery medications reviewed with the patient.  Reproductive/Obstetrics                           Anesthesia Physical Anesthesia Plan  ASA: IV  Anesthesia Plan: MAC   Post-op Pain Management:    Induction:   PONV Risk Score and Plan: Treatment may vary due to age or medical condition and Propofol infusion  Airway Management Planned: Natural Airway and Nasal Cannula  Additional Equipment:   Intra-op Plan:   Post-operative Plan:   Informed Consent: I have reviewed the patients History and Physical, chart, labs and discussed the procedure including the risks, benefits and alternatives for the proposed anesthesia with the patient or authorized representative who has indicated his/her understanding and acceptance.     Dental advisory given  Plan Discussed with: CRNA  Anesthesia Plan Comments:        Anesthesia Quick Evaluation

## 2018-12-20 NOTE — Discharge Instructions (Signed)

## 2018-12-20 NOTE — Transfer of Care (Signed)
Immediate Anesthesia Transfer of Care Note  Patient: Christian Dalton  Procedure(s) Performed: Procedure(s): ESOPHAGOGASTRODUODENOSCOPY (EGD) (N/A) STENT REMOVAL  Patient Location: PACU  Anesthesia Type:MAC  Level of Consciousness:  sedated, patient cooperative and responds to stimulation  Airway & Oxygen Therapy:Patient Spontanous Breathing and Patient connected to face mask oxgen  Post-op Assessment:  Report given to PACU RN and Post -op Vital signs reviewed and stable  Post vital signs:  Reviewed and stable  Last Vitals:  Vitals:   12/20/18 0943 12/20/18 1119  BP: (!) 138/57   Pulse: 74 77  Resp: 14 16  Temp: 37 C 36.7 C  SpO2: 47%     Complications: No apparent anesthesia complications

## 2018-12-21 ENCOUNTER — Encounter (HOSPITAL_COMMUNITY): Payer: Self-pay | Admitting: Gastroenterology

## 2018-12-21 DIAGNOSIS — D509 Iron deficiency anemia, unspecified: Secondary | ICD-10-CM | POA: Diagnosis not present

## 2018-12-21 DIAGNOSIS — N186 End stage renal disease: Secondary | ICD-10-CM | POA: Diagnosis not present

## 2018-12-21 DIAGNOSIS — E1121 Type 2 diabetes mellitus with diabetic nephropathy: Secondary | ICD-10-CM | POA: Diagnosis not present

## 2018-12-21 DIAGNOSIS — N2581 Secondary hyperparathyroidism of renal origin: Secondary | ICD-10-CM | POA: Diagnosis not present

## 2018-12-23 DIAGNOSIS — N2581 Secondary hyperparathyroidism of renal origin: Secondary | ICD-10-CM | POA: Diagnosis not present

## 2018-12-23 DIAGNOSIS — D509 Iron deficiency anemia, unspecified: Secondary | ICD-10-CM | POA: Diagnosis not present

## 2018-12-23 DIAGNOSIS — N186 End stage renal disease: Secondary | ICD-10-CM | POA: Diagnosis not present

## 2018-12-23 DIAGNOSIS — E1121 Type 2 diabetes mellitus with diabetic nephropathy: Secondary | ICD-10-CM | POA: Diagnosis not present

## 2018-12-25 DIAGNOSIS — E1122 Type 2 diabetes mellitus with diabetic chronic kidney disease: Secondary | ICD-10-CM | POA: Diagnosis not present

## 2018-12-25 DIAGNOSIS — N2581 Secondary hyperparathyroidism of renal origin: Secondary | ICD-10-CM | POA: Diagnosis not present

## 2018-12-25 DIAGNOSIS — Z992 Dependence on renal dialysis: Secondary | ICD-10-CM | POA: Diagnosis not present

## 2018-12-25 DIAGNOSIS — E1121 Type 2 diabetes mellitus with diabetic nephropathy: Secondary | ICD-10-CM | POA: Diagnosis not present

## 2018-12-25 DIAGNOSIS — D509 Iron deficiency anemia, unspecified: Secondary | ICD-10-CM | POA: Diagnosis not present

## 2018-12-25 DIAGNOSIS — N186 End stage renal disease: Secondary | ICD-10-CM | POA: Diagnosis not present

## 2018-12-25 DIAGNOSIS — D631 Anemia in chronic kidney disease: Secondary | ICD-10-CM | POA: Diagnosis not present

## 2018-12-28 DIAGNOSIS — D509 Iron deficiency anemia, unspecified: Secondary | ICD-10-CM | POA: Diagnosis not present

## 2018-12-28 DIAGNOSIS — N186 End stage renal disease: Secondary | ICD-10-CM | POA: Diagnosis not present

## 2018-12-28 DIAGNOSIS — N2581 Secondary hyperparathyroidism of renal origin: Secondary | ICD-10-CM | POA: Diagnosis not present

## 2018-12-28 DIAGNOSIS — E1121 Type 2 diabetes mellitus with diabetic nephropathy: Secondary | ICD-10-CM | POA: Diagnosis not present

## 2018-12-28 DIAGNOSIS — D631 Anemia in chronic kidney disease: Secondary | ICD-10-CM | POA: Diagnosis not present

## 2018-12-30 DIAGNOSIS — D509 Iron deficiency anemia, unspecified: Secondary | ICD-10-CM | POA: Diagnosis not present

## 2018-12-30 DIAGNOSIS — N186 End stage renal disease: Secondary | ICD-10-CM | POA: Diagnosis not present

## 2018-12-30 DIAGNOSIS — N2581 Secondary hyperparathyroidism of renal origin: Secondary | ICD-10-CM | POA: Diagnosis not present

## 2018-12-30 DIAGNOSIS — E1121 Type 2 diabetes mellitus with diabetic nephropathy: Secondary | ICD-10-CM | POA: Diagnosis not present

## 2018-12-30 DIAGNOSIS — D631 Anemia in chronic kidney disease: Secondary | ICD-10-CM | POA: Diagnosis not present

## 2019-01-01 DIAGNOSIS — D509 Iron deficiency anemia, unspecified: Secondary | ICD-10-CM | POA: Diagnosis not present

## 2019-01-01 DIAGNOSIS — N186 End stage renal disease: Secondary | ICD-10-CM | POA: Diagnosis not present

## 2019-01-01 DIAGNOSIS — E1121 Type 2 diabetes mellitus with diabetic nephropathy: Secondary | ICD-10-CM | POA: Diagnosis not present

## 2019-01-01 DIAGNOSIS — N2581 Secondary hyperparathyroidism of renal origin: Secondary | ICD-10-CM | POA: Diagnosis not present

## 2019-01-01 DIAGNOSIS — D631 Anemia in chronic kidney disease: Secondary | ICD-10-CM | POA: Diagnosis not present

## 2019-01-04 DIAGNOSIS — N186 End stage renal disease: Secondary | ICD-10-CM | POA: Diagnosis not present

## 2019-01-04 DIAGNOSIS — D631 Anemia in chronic kidney disease: Secondary | ICD-10-CM | POA: Diagnosis not present

## 2019-01-04 DIAGNOSIS — N2581 Secondary hyperparathyroidism of renal origin: Secondary | ICD-10-CM | POA: Diagnosis not present

## 2019-01-04 DIAGNOSIS — E1121 Type 2 diabetes mellitus with diabetic nephropathy: Secondary | ICD-10-CM | POA: Diagnosis not present

## 2019-01-04 DIAGNOSIS — D509 Iron deficiency anemia, unspecified: Secondary | ICD-10-CM | POA: Diagnosis not present

## 2019-01-06 DIAGNOSIS — D631 Anemia in chronic kidney disease: Secondary | ICD-10-CM | POA: Diagnosis not present

## 2019-01-06 DIAGNOSIS — N2581 Secondary hyperparathyroidism of renal origin: Secondary | ICD-10-CM | POA: Diagnosis not present

## 2019-01-06 DIAGNOSIS — D509 Iron deficiency anemia, unspecified: Secondary | ICD-10-CM | POA: Diagnosis not present

## 2019-01-06 DIAGNOSIS — E1121 Type 2 diabetes mellitus with diabetic nephropathy: Secondary | ICD-10-CM | POA: Diagnosis not present

## 2019-01-06 DIAGNOSIS — N186 End stage renal disease: Secondary | ICD-10-CM | POA: Diagnosis not present

## 2019-01-08 DIAGNOSIS — D631 Anemia in chronic kidney disease: Secondary | ICD-10-CM | POA: Diagnosis not present

## 2019-01-08 DIAGNOSIS — N2581 Secondary hyperparathyroidism of renal origin: Secondary | ICD-10-CM | POA: Diagnosis not present

## 2019-01-08 DIAGNOSIS — D509 Iron deficiency anemia, unspecified: Secondary | ICD-10-CM | POA: Diagnosis not present

## 2019-01-08 DIAGNOSIS — N186 End stage renal disease: Secondary | ICD-10-CM | POA: Diagnosis not present

## 2019-01-08 DIAGNOSIS — E1121 Type 2 diabetes mellitus with diabetic nephropathy: Secondary | ICD-10-CM | POA: Diagnosis not present

## 2019-01-10 DIAGNOSIS — M47816 Spondylosis without myelopathy or radiculopathy, lumbar region: Secondary | ICD-10-CM | POA: Diagnosis not present

## 2019-01-11 DIAGNOSIS — D509 Iron deficiency anemia, unspecified: Secondary | ICD-10-CM | POA: Diagnosis not present

## 2019-01-11 DIAGNOSIS — N186 End stage renal disease: Secondary | ICD-10-CM | POA: Diagnosis not present

## 2019-01-11 DIAGNOSIS — E1121 Type 2 diabetes mellitus with diabetic nephropathy: Secondary | ICD-10-CM | POA: Diagnosis not present

## 2019-01-11 DIAGNOSIS — D631 Anemia in chronic kidney disease: Secondary | ICD-10-CM | POA: Diagnosis not present

## 2019-01-11 DIAGNOSIS — N2581 Secondary hyperparathyroidism of renal origin: Secondary | ICD-10-CM | POA: Diagnosis not present

## 2019-01-13 DIAGNOSIS — D631 Anemia in chronic kidney disease: Secondary | ICD-10-CM | POA: Diagnosis not present

## 2019-01-13 DIAGNOSIS — D509 Iron deficiency anemia, unspecified: Secondary | ICD-10-CM | POA: Diagnosis not present

## 2019-01-13 DIAGNOSIS — N186 End stage renal disease: Secondary | ICD-10-CM | POA: Diagnosis not present

## 2019-01-13 DIAGNOSIS — N2581 Secondary hyperparathyroidism of renal origin: Secondary | ICD-10-CM | POA: Diagnosis not present

## 2019-01-13 DIAGNOSIS — E1121 Type 2 diabetes mellitus with diabetic nephropathy: Secondary | ICD-10-CM | POA: Diagnosis not present

## 2019-01-15 DIAGNOSIS — N186 End stage renal disease: Secondary | ICD-10-CM | POA: Diagnosis not present

## 2019-01-15 DIAGNOSIS — D631 Anemia in chronic kidney disease: Secondary | ICD-10-CM | POA: Diagnosis not present

## 2019-01-15 DIAGNOSIS — E1121 Type 2 diabetes mellitus with diabetic nephropathy: Secondary | ICD-10-CM | POA: Diagnosis not present

## 2019-01-15 DIAGNOSIS — D509 Iron deficiency anemia, unspecified: Secondary | ICD-10-CM | POA: Diagnosis not present

## 2019-01-15 DIAGNOSIS — N2581 Secondary hyperparathyroidism of renal origin: Secondary | ICD-10-CM | POA: Diagnosis not present

## 2019-01-17 DIAGNOSIS — M47816 Spondylosis without myelopathy or radiculopathy, lumbar region: Secondary | ICD-10-CM | POA: Diagnosis not present

## 2019-01-18 DIAGNOSIS — E1121 Type 2 diabetes mellitus with diabetic nephropathy: Secondary | ICD-10-CM | POA: Diagnosis not present

## 2019-01-18 DIAGNOSIS — N186 End stage renal disease: Secondary | ICD-10-CM | POA: Diagnosis not present

## 2019-01-18 DIAGNOSIS — D509 Iron deficiency anemia, unspecified: Secondary | ICD-10-CM | POA: Diagnosis not present

## 2019-01-18 DIAGNOSIS — D631 Anemia in chronic kidney disease: Secondary | ICD-10-CM | POA: Diagnosis not present

## 2019-01-18 DIAGNOSIS — N2581 Secondary hyperparathyroidism of renal origin: Secondary | ICD-10-CM | POA: Diagnosis not present

## 2019-01-20 DIAGNOSIS — D631 Anemia in chronic kidney disease: Secondary | ICD-10-CM | POA: Diagnosis not present

## 2019-01-20 DIAGNOSIS — D509 Iron deficiency anemia, unspecified: Secondary | ICD-10-CM | POA: Diagnosis not present

## 2019-01-20 DIAGNOSIS — E1121 Type 2 diabetes mellitus with diabetic nephropathy: Secondary | ICD-10-CM | POA: Diagnosis not present

## 2019-01-20 DIAGNOSIS — N186 End stage renal disease: Secondary | ICD-10-CM | POA: Diagnosis not present

## 2019-01-20 DIAGNOSIS — N2581 Secondary hyperparathyroidism of renal origin: Secondary | ICD-10-CM | POA: Diagnosis not present

## 2019-01-22 DIAGNOSIS — E1121 Type 2 diabetes mellitus with diabetic nephropathy: Secondary | ICD-10-CM | POA: Diagnosis not present

## 2019-01-22 DIAGNOSIS — D631 Anemia in chronic kidney disease: Secondary | ICD-10-CM | POA: Diagnosis not present

## 2019-01-22 DIAGNOSIS — D509 Iron deficiency anemia, unspecified: Secondary | ICD-10-CM | POA: Diagnosis not present

## 2019-01-22 DIAGNOSIS — N2581 Secondary hyperparathyroidism of renal origin: Secondary | ICD-10-CM | POA: Diagnosis not present

## 2019-01-22 DIAGNOSIS — N186 End stage renal disease: Secondary | ICD-10-CM | POA: Diagnosis not present

## 2019-01-23 DIAGNOSIS — N186 End stage renal disease: Secondary | ICD-10-CM | POA: Diagnosis not present

## 2019-01-23 DIAGNOSIS — Z992 Dependence on renal dialysis: Secondary | ICD-10-CM | POA: Diagnosis not present

## 2019-01-23 DIAGNOSIS — E1122 Type 2 diabetes mellitus with diabetic chronic kidney disease: Secondary | ICD-10-CM | POA: Diagnosis not present

## 2019-01-25 DIAGNOSIS — N2581 Secondary hyperparathyroidism of renal origin: Secondary | ICD-10-CM | POA: Diagnosis not present

## 2019-01-25 DIAGNOSIS — E1121 Type 2 diabetes mellitus with diabetic nephropathy: Secondary | ICD-10-CM | POA: Diagnosis not present

## 2019-01-25 DIAGNOSIS — N186 End stage renal disease: Secondary | ICD-10-CM | POA: Diagnosis not present

## 2019-01-27 DIAGNOSIS — N186 End stage renal disease: Secondary | ICD-10-CM | POA: Diagnosis not present

## 2019-01-27 DIAGNOSIS — N2581 Secondary hyperparathyroidism of renal origin: Secondary | ICD-10-CM | POA: Diagnosis not present

## 2019-01-27 DIAGNOSIS — E1121 Type 2 diabetes mellitus with diabetic nephropathy: Secondary | ICD-10-CM | POA: Diagnosis not present

## 2019-01-29 DIAGNOSIS — N186 End stage renal disease: Secondary | ICD-10-CM | POA: Diagnosis not present

## 2019-01-29 DIAGNOSIS — E1121 Type 2 diabetes mellitus with diabetic nephropathy: Secondary | ICD-10-CM | POA: Diagnosis not present

## 2019-01-29 DIAGNOSIS — N2581 Secondary hyperparathyroidism of renal origin: Secondary | ICD-10-CM | POA: Diagnosis not present

## 2019-02-03 DIAGNOSIS — N186 End stage renal disease: Secondary | ICD-10-CM | POA: Diagnosis not present

## 2019-02-03 DIAGNOSIS — E1121 Type 2 diabetes mellitus with diabetic nephropathy: Secondary | ICD-10-CM | POA: Diagnosis not present

## 2019-02-03 DIAGNOSIS — N2581 Secondary hyperparathyroidism of renal origin: Secondary | ICD-10-CM | POA: Diagnosis not present

## 2019-02-05 DIAGNOSIS — N186 End stage renal disease: Secondary | ICD-10-CM | POA: Diagnosis not present

## 2019-02-05 DIAGNOSIS — E1121 Type 2 diabetes mellitus with diabetic nephropathy: Secondary | ICD-10-CM | POA: Diagnosis not present

## 2019-02-05 DIAGNOSIS — N2581 Secondary hyperparathyroidism of renal origin: Secondary | ICD-10-CM | POA: Diagnosis not present

## 2019-02-08 DIAGNOSIS — E1121 Type 2 diabetes mellitus with diabetic nephropathy: Secondary | ICD-10-CM | POA: Diagnosis not present

## 2019-02-08 DIAGNOSIS — N2581 Secondary hyperparathyroidism of renal origin: Secondary | ICD-10-CM | POA: Diagnosis not present

## 2019-02-08 DIAGNOSIS — N186 End stage renal disease: Secondary | ICD-10-CM | POA: Diagnosis not present

## 2019-02-10 DIAGNOSIS — N2581 Secondary hyperparathyroidism of renal origin: Secondary | ICD-10-CM | POA: Diagnosis not present

## 2019-02-10 DIAGNOSIS — E1121 Type 2 diabetes mellitus with diabetic nephropathy: Secondary | ICD-10-CM | POA: Diagnosis not present

## 2019-02-10 DIAGNOSIS — N186 End stage renal disease: Secondary | ICD-10-CM | POA: Diagnosis not present

## 2019-02-12 DIAGNOSIS — E1121 Type 2 diabetes mellitus with diabetic nephropathy: Secondary | ICD-10-CM | POA: Diagnosis not present

## 2019-02-12 DIAGNOSIS — N186 End stage renal disease: Secondary | ICD-10-CM | POA: Diagnosis not present

## 2019-02-12 DIAGNOSIS — N2581 Secondary hyperparathyroidism of renal origin: Secondary | ICD-10-CM | POA: Diagnosis not present

## 2019-02-15 DIAGNOSIS — N186 End stage renal disease: Secondary | ICD-10-CM | POA: Diagnosis not present

## 2019-02-15 DIAGNOSIS — N2581 Secondary hyperparathyroidism of renal origin: Secondary | ICD-10-CM | POA: Diagnosis not present

## 2019-02-15 DIAGNOSIS — E1121 Type 2 diabetes mellitus with diabetic nephropathy: Secondary | ICD-10-CM | POA: Diagnosis not present

## 2019-02-17 DIAGNOSIS — N186 End stage renal disease: Secondary | ICD-10-CM | POA: Diagnosis not present

## 2019-02-17 DIAGNOSIS — E1121 Type 2 diabetes mellitus with diabetic nephropathy: Secondary | ICD-10-CM | POA: Diagnosis not present

## 2019-02-17 DIAGNOSIS — N2581 Secondary hyperparathyroidism of renal origin: Secondary | ICD-10-CM | POA: Diagnosis not present

## 2019-02-19 DIAGNOSIS — N186 End stage renal disease: Secondary | ICD-10-CM | POA: Diagnosis not present

## 2019-02-19 DIAGNOSIS — E1121 Type 2 diabetes mellitus with diabetic nephropathy: Secondary | ICD-10-CM | POA: Diagnosis not present

## 2019-02-19 DIAGNOSIS — N2581 Secondary hyperparathyroidism of renal origin: Secondary | ICD-10-CM | POA: Diagnosis not present

## 2019-02-21 DIAGNOSIS — M47816 Spondylosis without myelopathy or radiculopathy, lumbar region: Secondary | ICD-10-CM | POA: Diagnosis not present

## 2019-02-21 DIAGNOSIS — M5126 Other intervertebral disc displacement, lumbar region: Secondary | ICD-10-CM | POA: Diagnosis not present

## 2019-02-22 DIAGNOSIS — E1121 Type 2 diabetes mellitus with diabetic nephropathy: Secondary | ICD-10-CM | POA: Diagnosis not present

## 2019-02-22 DIAGNOSIS — N2581 Secondary hyperparathyroidism of renal origin: Secondary | ICD-10-CM | POA: Diagnosis not present

## 2019-02-22 DIAGNOSIS — N186 End stage renal disease: Secondary | ICD-10-CM | POA: Diagnosis not present

## 2019-02-23 DIAGNOSIS — Z992 Dependence on renal dialysis: Secondary | ICD-10-CM | POA: Diagnosis not present

## 2019-02-23 DIAGNOSIS — N186 End stage renal disease: Secondary | ICD-10-CM | POA: Diagnosis not present

## 2019-02-23 DIAGNOSIS — E1122 Type 2 diabetes mellitus with diabetic chronic kidney disease: Secondary | ICD-10-CM | POA: Diagnosis not present

## 2019-02-24 DIAGNOSIS — N186 End stage renal disease: Secondary | ICD-10-CM | POA: Diagnosis not present

## 2019-02-24 DIAGNOSIS — N2581 Secondary hyperparathyroidism of renal origin: Secondary | ICD-10-CM | POA: Diagnosis not present

## 2019-02-24 DIAGNOSIS — E1121 Type 2 diabetes mellitus with diabetic nephropathy: Secondary | ICD-10-CM | POA: Diagnosis not present

## 2019-02-26 DIAGNOSIS — N186 End stage renal disease: Secondary | ICD-10-CM | POA: Diagnosis not present

## 2019-02-26 DIAGNOSIS — N2581 Secondary hyperparathyroidism of renal origin: Secondary | ICD-10-CM | POA: Diagnosis not present

## 2019-02-26 DIAGNOSIS — E1121 Type 2 diabetes mellitus with diabetic nephropathy: Secondary | ICD-10-CM | POA: Diagnosis not present

## 2019-03-01 DIAGNOSIS — N2581 Secondary hyperparathyroidism of renal origin: Secondary | ICD-10-CM | POA: Diagnosis not present

## 2019-03-01 DIAGNOSIS — N186 End stage renal disease: Secondary | ICD-10-CM | POA: Diagnosis not present

## 2019-03-01 DIAGNOSIS — E1121 Type 2 diabetes mellitus with diabetic nephropathy: Secondary | ICD-10-CM | POA: Diagnosis not present

## 2019-03-03 DIAGNOSIS — E1121 Type 2 diabetes mellitus with diabetic nephropathy: Secondary | ICD-10-CM | POA: Diagnosis not present

## 2019-03-03 DIAGNOSIS — N186 End stage renal disease: Secondary | ICD-10-CM | POA: Diagnosis not present

## 2019-03-03 DIAGNOSIS — N2581 Secondary hyperparathyroidism of renal origin: Secondary | ICD-10-CM | POA: Diagnosis not present

## 2019-03-05 DIAGNOSIS — N186 End stage renal disease: Secondary | ICD-10-CM | POA: Diagnosis not present

## 2019-03-05 DIAGNOSIS — E1121 Type 2 diabetes mellitus with diabetic nephropathy: Secondary | ICD-10-CM | POA: Diagnosis not present

## 2019-03-05 DIAGNOSIS — N2581 Secondary hyperparathyroidism of renal origin: Secondary | ICD-10-CM | POA: Diagnosis not present

## 2019-03-08 DIAGNOSIS — E1121 Type 2 diabetes mellitus with diabetic nephropathy: Secondary | ICD-10-CM | POA: Diagnosis not present

## 2019-03-08 DIAGNOSIS — N2581 Secondary hyperparathyroidism of renal origin: Secondary | ICD-10-CM | POA: Diagnosis not present

## 2019-03-08 DIAGNOSIS — N186 End stage renal disease: Secondary | ICD-10-CM | POA: Diagnosis not present

## 2019-03-10 DIAGNOSIS — E1121 Type 2 diabetes mellitus with diabetic nephropathy: Secondary | ICD-10-CM | POA: Diagnosis not present

## 2019-03-10 DIAGNOSIS — N2581 Secondary hyperparathyroidism of renal origin: Secondary | ICD-10-CM | POA: Diagnosis not present

## 2019-03-10 DIAGNOSIS — N186 End stage renal disease: Secondary | ICD-10-CM | POA: Diagnosis not present

## 2019-03-12 DIAGNOSIS — N186 End stage renal disease: Secondary | ICD-10-CM | POA: Diagnosis not present

## 2019-03-12 DIAGNOSIS — E1121 Type 2 diabetes mellitus with diabetic nephropathy: Secondary | ICD-10-CM | POA: Diagnosis not present

## 2019-03-12 DIAGNOSIS — N2581 Secondary hyperparathyroidism of renal origin: Secondary | ICD-10-CM | POA: Diagnosis not present

## 2019-03-15 DIAGNOSIS — E1121 Type 2 diabetes mellitus with diabetic nephropathy: Secondary | ICD-10-CM | POA: Diagnosis not present

## 2019-03-15 DIAGNOSIS — N2581 Secondary hyperparathyroidism of renal origin: Secondary | ICD-10-CM | POA: Diagnosis not present

## 2019-03-15 DIAGNOSIS — N186 End stage renal disease: Secondary | ICD-10-CM | POA: Diagnosis not present

## 2019-03-17 DIAGNOSIS — N186 End stage renal disease: Secondary | ICD-10-CM | POA: Diagnosis not present

## 2019-03-17 DIAGNOSIS — N2581 Secondary hyperparathyroidism of renal origin: Secondary | ICD-10-CM | POA: Diagnosis not present

## 2019-03-17 DIAGNOSIS — E1121 Type 2 diabetes mellitus with diabetic nephropathy: Secondary | ICD-10-CM | POA: Diagnosis not present

## 2019-03-19 DIAGNOSIS — E1121 Type 2 diabetes mellitus with diabetic nephropathy: Secondary | ICD-10-CM | POA: Diagnosis not present

## 2019-03-19 DIAGNOSIS — N186 End stage renal disease: Secondary | ICD-10-CM | POA: Diagnosis not present

## 2019-03-19 DIAGNOSIS — N2581 Secondary hyperparathyroidism of renal origin: Secondary | ICD-10-CM | POA: Diagnosis not present

## 2019-03-21 DIAGNOSIS — Z9049 Acquired absence of other specified parts of digestive tract: Secondary | ICD-10-CM | POA: Diagnosis not present

## 2019-03-21 DIAGNOSIS — K862 Cyst of pancreas: Secondary | ICD-10-CM | POA: Diagnosis not present

## 2019-03-21 DIAGNOSIS — Z86010 Personal history of colonic polyps: Secondary | ICD-10-CM | POA: Diagnosis not present

## 2019-03-22 DIAGNOSIS — N2581 Secondary hyperparathyroidism of renal origin: Secondary | ICD-10-CM | POA: Diagnosis not present

## 2019-03-22 DIAGNOSIS — N186 End stage renal disease: Secondary | ICD-10-CM | POA: Diagnosis not present

## 2019-03-22 DIAGNOSIS — E1121 Type 2 diabetes mellitus with diabetic nephropathy: Secondary | ICD-10-CM | POA: Diagnosis not present

## 2019-03-24 DIAGNOSIS — E1121 Type 2 diabetes mellitus with diabetic nephropathy: Secondary | ICD-10-CM | POA: Diagnosis not present

## 2019-03-24 DIAGNOSIS — N2581 Secondary hyperparathyroidism of renal origin: Secondary | ICD-10-CM | POA: Diagnosis not present

## 2019-03-24 DIAGNOSIS — N186 End stage renal disease: Secondary | ICD-10-CM | POA: Diagnosis not present

## 2019-03-25 DIAGNOSIS — Z992 Dependence on renal dialysis: Secondary | ICD-10-CM | POA: Diagnosis not present

## 2019-03-25 DIAGNOSIS — E1122 Type 2 diabetes mellitus with diabetic chronic kidney disease: Secondary | ICD-10-CM | POA: Diagnosis not present

## 2019-03-25 DIAGNOSIS — N186 End stage renal disease: Secondary | ICD-10-CM | POA: Diagnosis not present

## 2019-03-26 DIAGNOSIS — E1121 Type 2 diabetes mellitus with diabetic nephropathy: Secondary | ICD-10-CM | POA: Diagnosis not present

## 2019-03-26 DIAGNOSIS — N186 End stage renal disease: Secondary | ICD-10-CM | POA: Diagnosis not present

## 2019-03-26 DIAGNOSIS — N2581 Secondary hyperparathyroidism of renal origin: Secondary | ICD-10-CM | POA: Diagnosis not present

## 2019-03-29 DIAGNOSIS — R0781 Pleurodynia: Secondary | ICD-10-CM | POA: Diagnosis not present

## 2019-03-29 DIAGNOSIS — E1151 Type 2 diabetes mellitus with diabetic peripheral angiopathy without gangrene: Secondary | ICD-10-CM | POA: Diagnosis not present

## 2019-03-29 DIAGNOSIS — N186 End stage renal disease: Secondary | ICD-10-CM | POA: Diagnosis not present

## 2019-03-29 DIAGNOSIS — R61 Generalized hyperhidrosis: Secondary | ICD-10-CM | POA: Diagnosis not present

## 2019-03-29 DIAGNOSIS — Z794 Long term (current) use of insulin: Secondary | ICD-10-CM | POA: Diagnosis not present

## 2019-03-29 DIAGNOSIS — Z992 Dependence on renal dialysis: Secondary | ICD-10-CM | POA: Diagnosis not present

## 2019-03-29 DIAGNOSIS — R630 Anorexia: Secondary | ICD-10-CM | POA: Diagnosis not present

## 2019-03-31 DIAGNOSIS — E1121 Type 2 diabetes mellitus with diabetic nephropathy: Secondary | ICD-10-CM | POA: Diagnosis not present

## 2019-03-31 DIAGNOSIS — N2581 Secondary hyperparathyroidism of renal origin: Secondary | ICD-10-CM | POA: Diagnosis not present

## 2019-03-31 DIAGNOSIS — N186 End stage renal disease: Secondary | ICD-10-CM | POA: Diagnosis not present

## 2019-04-02 DIAGNOSIS — N2581 Secondary hyperparathyroidism of renal origin: Secondary | ICD-10-CM | POA: Diagnosis not present

## 2019-04-02 DIAGNOSIS — N186 End stage renal disease: Secondary | ICD-10-CM | POA: Diagnosis not present

## 2019-04-02 DIAGNOSIS — E1121 Type 2 diabetes mellitus with diabetic nephropathy: Secondary | ICD-10-CM | POA: Diagnosis not present

## 2019-04-05 DIAGNOSIS — N186 End stage renal disease: Secondary | ICD-10-CM | POA: Diagnosis not present

## 2019-04-05 DIAGNOSIS — N2581 Secondary hyperparathyroidism of renal origin: Secondary | ICD-10-CM | POA: Diagnosis not present

## 2019-04-05 DIAGNOSIS — E1121 Type 2 diabetes mellitus with diabetic nephropathy: Secondary | ICD-10-CM | POA: Diagnosis not present

## 2019-04-07 ENCOUNTER — Emergency Department (HOSPITAL_COMMUNITY): Payer: Medicare Other

## 2019-04-07 ENCOUNTER — Observation Stay (HOSPITAL_COMMUNITY)
Admission: EM | Admit: 2019-04-07 | Discharge: 2019-04-08 | Disposition: A | Discharge status: Home / Self Care | Payer: Medicare Other | Attending: Internal Medicine | Admitting: Internal Medicine

## 2019-04-07 ENCOUNTER — Encounter (HOSPITAL_COMMUNITY): Payer: Self-pay | Admitting: Emergency Medicine

## 2019-04-07 ENCOUNTER — Other Ambulatory Visit: Payer: Self-pay

## 2019-04-07 DIAGNOSIS — Z1159 Encounter for screening for other viral diseases: Secondary | ICD-10-CM | POA: Insufficient documentation

## 2019-04-07 DIAGNOSIS — Z20828 Contact with and (suspected) exposure to other viral communicable diseases: Secondary | ICD-10-CM | POA: Diagnosis not present

## 2019-04-07 DIAGNOSIS — J449 Chronic obstructive pulmonary disease, unspecified: Secondary | ICD-10-CM | POA: Insufficient documentation

## 2019-04-07 DIAGNOSIS — Z992 Dependence on renal dialysis: Secondary | ICD-10-CM | POA: Insufficient documentation

## 2019-04-07 DIAGNOSIS — I4891 Unspecified atrial fibrillation: Secondary | ICD-10-CM | POA: Diagnosis not present

## 2019-04-07 DIAGNOSIS — Z794 Long term (current) use of insulin: Secondary | ICD-10-CM | POA: Insufficient documentation

## 2019-04-07 DIAGNOSIS — Z79899 Other long term (current) drug therapy: Secondary | ICD-10-CM | POA: Diagnosis not present

## 2019-04-07 DIAGNOSIS — R0602 Shortness of breath: Secondary | ICD-10-CM | POA: Diagnosis not present

## 2019-04-07 DIAGNOSIS — E785 Hyperlipidemia, unspecified: Secondary | ICD-10-CM | POA: Insufficient documentation

## 2019-04-07 DIAGNOSIS — N186 End stage renal disease: Secondary | ICD-10-CM | POA: Diagnosis not present

## 2019-04-07 DIAGNOSIS — Z6835 Body mass index (BMI) 35.0-35.9, adult: Secondary | ICD-10-CM | POA: Insufficient documentation

## 2019-04-07 DIAGNOSIS — Z7982 Long term (current) use of aspirin: Secondary | ICD-10-CM | POA: Diagnosis not present

## 2019-04-07 DIAGNOSIS — Z9049 Acquired absence of other specified parts of digestive tract: Secondary | ICD-10-CM | POA: Diagnosis not present

## 2019-04-07 DIAGNOSIS — R9431 Abnormal electrocardiogram [ECG] [EKG]: Secondary | ICD-10-CM

## 2019-04-07 DIAGNOSIS — N281 Cyst of kidney, acquired: Secondary | ICD-10-CM | POA: Diagnosis not present

## 2019-04-07 DIAGNOSIS — I120 Hypertensive chronic kidney disease with stage 5 chronic kidney disease or end stage renal disease: Secondary | ICD-10-CM | POA: Diagnosis not present

## 2019-04-07 DIAGNOSIS — D631 Anemia in chronic kidney disease: Secondary | ICD-10-CM | POA: Diagnosis not present

## 2019-04-07 DIAGNOSIS — Z79890 Hormone replacement therapy (postmenopausal): Secondary | ICD-10-CM | POA: Insufficient documentation

## 2019-04-07 DIAGNOSIS — E1122 Type 2 diabetes mellitus with diabetic chronic kidney disease: Secondary | ICD-10-CM | POA: Diagnosis not present

## 2019-04-07 DIAGNOSIS — R110 Nausea: Secondary | ICD-10-CM | POA: Insufficient documentation

## 2019-04-07 DIAGNOSIS — I2510 Atherosclerotic heart disease of native coronary artery without angina pectoris: Secondary | ICD-10-CM | POA: Diagnosis present

## 2019-04-07 DIAGNOSIS — R7989 Other specified abnormal findings of blood chemistry: Principal | ICD-10-CM | POA: Insufficient documentation

## 2019-04-07 DIAGNOSIS — E039 Hypothyroidism, unspecified: Secondary | ICD-10-CM | POA: Insufficient documentation

## 2019-04-07 DIAGNOSIS — F329 Major depressive disorder, single episode, unspecified: Secondary | ICD-10-CM | POA: Insufficient documentation

## 2019-04-07 DIAGNOSIS — R778 Other specified abnormalities of plasma proteins: Secondary | ICD-10-CM | POA: Diagnosis present

## 2019-04-07 DIAGNOSIS — Z888 Allergy status to other drugs, medicaments and biological substances status: Secondary | ICD-10-CM | POA: Insufficient documentation

## 2019-04-07 DIAGNOSIS — R05 Cough: Secondary | ICD-10-CM | POA: Diagnosis not present

## 2019-04-07 DIAGNOSIS — N2581 Secondary hyperparathyroidism of renal origin: Secondary | ICD-10-CM | POA: Diagnosis not present

## 2019-04-07 DIAGNOSIS — G4733 Obstructive sleep apnea (adult) (pediatric): Secondary | ICD-10-CM | POA: Insufficient documentation

## 2019-04-07 DIAGNOSIS — Z8249 Family history of ischemic heart disease and other diseases of the circulatory system: Secondary | ICD-10-CM | POA: Insufficient documentation

## 2019-04-07 DIAGNOSIS — I499 Cardiac arrhythmia, unspecified: Secondary | ICD-10-CM | POA: Diagnosis not present

## 2019-04-07 DIAGNOSIS — Z7722 Contact with and (suspected) exposure to environmental tobacco smoke (acute) (chronic): Secondary | ICD-10-CM | POA: Diagnosis not present

## 2019-04-07 DIAGNOSIS — K760 Fatty (change of) liver, not elsewhere classified: Secondary | ICD-10-CM | POA: Diagnosis not present

## 2019-04-07 DIAGNOSIS — F32A Depression, unspecified: Secondary | ICD-10-CM | POA: Diagnosis present

## 2019-04-07 DIAGNOSIS — E8889 Other specified metabolic disorders: Secondary | ICD-10-CM | POA: Diagnosis not present

## 2019-04-07 DIAGNOSIS — E11649 Type 2 diabetes mellitus with hypoglycemia without coma: Secondary | ICD-10-CM | POA: Diagnosis not present

## 2019-04-07 DIAGNOSIS — E1129 Type 2 diabetes mellitus with other diabetic kidney complication: Secondary | ICD-10-CM | POA: Diagnosis present

## 2019-04-07 DIAGNOSIS — Z882 Allergy status to sulfonamides status: Secondary | ICD-10-CM | POA: Insufficient documentation

## 2019-04-07 DIAGNOSIS — R112 Nausea with vomiting, unspecified: Secondary | ICD-10-CM | POA: Diagnosis not present

## 2019-04-07 DIAGNOSIS — Z881 Allergy status to other antibiotic agents status: Secondary | ICD-10-CM | POA: Insufficient documentation

## 2019-04-07 LAB — LACTIC ACID, PLASMA
Lactic Acid, Venous: 1 mmol/L (ref 0.5–1.9)
Lactic Acid, Venous: 0.9 mmol/L (ref 0.5–1.9)

## 2019-04-07 LAB — CBC WITH DIFFERENTIAL/PLATELET
Abs Immature Granulocytes: 0.06 10*3/uL (ref 0.00–0.07)
Basophils Absolute: 0.1 10*3/uL (ref 0.0–0.1)
Basophils Relative: 1 %
Eosinophils Absolute: 0.1 10*3/uL (ref 0.0–0.5)
Eosinophils Relative: 1 %
HCT: 41.3 % (ref 39.0–52.0)
Hemoglobin: 13.1 g/dL (ref 13.0–17.0)
Immature Granulocytes: 1 %
Lymphocytes Relative: 17 %
Lymphs Abs: 1.5 10*3/uL (ref 0.7–4.0)
MCH: 29.9 pg (ref 26.0–34.0)
MCHC: 31.7 g/dL (ref 30.0–36.0)
MCV: 94.3 fL (ref 80.0–100.0)
Monocytes Absolute: 0.6 10*3/uL (ref 0.1–1.0)
Monocytes Relative: 6 %
Neutro Abs: 6.6 10*3/uL (ref 1.7–7.7)
Neutrophils Relative %: 74 %
Platelets: 402 10*3/uL — ABNORMAL HIGH (ref 150–400)
RBC: 4.38 MIL/uL (ref 4.22–5.81)
RDW: 14.8 % (ref 11.5–15.5)
WBC: 8.9 10*3/uL (ref 4.0–10.5)
nRBC: 0 % (ref 0.0–0.2)

## 2019-04-07 LAB — COMPREHENSIVE METABOLIC PANEL
ALT: 24 U/L (ref 0–44)
AST: 13 U/L — ABNORMAL LOW (ref 15–41)
Albumin: 3.2 g/dL — ABNORMAL LOW (ref 3.5–5.0)
Alkaline Phosphatase: 168 U/L — ABNORMAL HIGH (ref 38–126)
Anion gap: 15 (ref 5–15)
BUN: 45 mg/dL — ABNORMAL HIGH (ref 8–23)
CO2: 27 mmol/L (ref 22–32)
Calcium: 9.4 mg/dL (ref 8.9–10.3)
Chloride: 96 mmol/L — ABNORMAL LOW (ref 98–111)
Creatinine, Ser: 8.86 mg/dL — ABNORMAL HIGH (ref 0.61–1.24)
GFR calc Af Amer: 6 mL/min — ABNORMAL LOW (ref >60)
GFR calc non Af Amer: 5 mL/min — ABNORMAL LOW (ref >60)
Glucose, Bld: 83 mg/dL (ref 70–99)
Potassium: 4.7 mmol/L (ref 3.5–5.1)
Sodium: 138 mmol/L (ref 135–145)
Total Bilirubin: 0.7 mg/dL (ref 0.3–1.2)
Total Protein: 6.2 g/dL — ABNORMAL LOW (ref 6.5–8.1)

## 2019-04-07 LAB — CBG MONITORING, ED
Glucose-Capillary: 58 mg/dL — ABNORMAL LOW (ref 70–99)
Glucose-Capillary: 102 mg/dL — ABNORMAL HIGH (ref 70–99)

## 2019-04-07 LAB — TROPONIN I
Troponin I: 0.03 ng/mL (ref <0.03)
Troponin I: 0.03 ng/mL (ref <0.03)
Troponin I: <0.03 ng/mL (ref <0.03)

## 2019-04-07 LAB — GLUCOSE, CAPILLARY
Glucose-Capillary: 151 mg/dL — ABNORMAL HIGH (ref 70–99)
Glucose-Capillary: 68 mg/dL — ABNORMAL LOW (ref 70–99)

## 2019-04-07 LAB — SARS CORONAVIRUS 2 BY RT PCR (HOSPITAL ORDER, PERFORMED IN ~~LOC~~ HOSPITAL LAB): SARS Coronavirus 2: NEGATIVE

## 2019-04-07 LAB — LIPASE, BLOOD: Lipase: 25 U/L (ref 11–51)

## 2019-04-07 MED ORDER — INSULIN DETEMIR 100 UNIT/ML ~~LOC~~ SOLN
10.0000 [IU] | Freq: Two times a day (BID) | SUBCUTANEOUS | Status: DC
  Filled 2019-04-07 (×2): qty 0.1

## 2019-04-07 MED ORDER — PIPERACILLIN-TAZOBACTAM 3.375 G IVPB 30 MIN: 3.3750 g | Freq: Once | INTRAVENOUS | Status: DC

## 2019-04-07 MED ORDER — INSULIN ASPART 100 UNIT/ML ~~LOC~~ SOLN: 0.0000 [IU] | Freq: Three times a day (TID) | SUBCUTANEOUS | Status: DC

## 2019-04-07 MED ORDER — HEPARIN SODIUM (PORCINE) 5000 UNIT/ML IJ SOLN
5000.0000 [IU] | Freq: Three times a day (TID) | INTRAMUSCULAR | Status: DC
  Administered 2019-04-07 – 2019-04-08 (×2): 5000 [IU] via SUBCUTANEOUS
  Filled 2019-04-07 (×2): qty 1

## 2019-04-07 MED ORDER — INSULIN ASPART 100 UNIT/ML ~~LOC~~ SOLN: 0.0000 [IU] | Freq: Every day | SUBCUTANEOUS | Status: DC

## 2019-04-07 MED ORDER — MONTELUKAST SODIUM 10 MG PO TABS
10.0000 mg | ORAL_TABLET | Freq: Every evening | ORAL | Status: DC
  Administered 2019-04-07: 10 mg via ORAL
  Filled 2019-04-07: qty 1

## 2019-04-07 MED ORDER — ONDANSETRON HCL 4 MG/2ML IJ SOLN: 4.0000 mg | Freq: Three times a day (TID) | INTRAMUSCULAR | Status: DC | PRN

## 2019-04-07 MED ORDER — MORPHINE SULFATE (PF) 2 MG/ML IV SOLN: 1.0000 mg | INTRAVENOUS | Status: DC | PRN

## 2019-04-07 MED ORDER — INSULIN ASPART 100 UNIT/ML ~~LOC~~ SOLN
0.0000 [IU] | Freq: Three times a day (TID) | SUBCUTANEOUS | Status: DC
  Administered 2019-04-08: 1 [IU] via SUBCUTANEOUS

## 2019-04-07 MED ORDER — LINACLOTIDE 145 MCG PO CAPS: 290.0000 ug | ORAL_CAPSULE | Freq: Every day | ORAL | Status: DC

## 2019-04-07 MED ORDER — OXYCODONE-ACETAMINOPHEN 10-325 MG PO TABS: 1.0000 | ORAL_TABLET | Freq: Four times a day (QID) | ORAL | Status: DC | PRN

## 2019-04-07 MED ORDER — LEVOTHYROXINE SODIUM 25 MCG PO TABS
125.0000 ug | ORAL_TABLET | Freq: Every day | ORAL | Status: DC
  Administered 2019-04-08: 125 ug via ORAL
  Filled 2019-04-07: qty 1

## 2019-04-07 MED ORDER — SEVELAMER CARBONATE 800 MG PO TABS
2400.0000 mg | ORAL_TABLET | Freq: Two times a day (BID) | ORAL | Status: DC
  Filled 2019-04-07: qty 3

## 2019-04-07 MED ORDER — OXYCODONE HCL 5 MG PO TABS: 5.0000 mg | ORAL_TABLET | Freq: Four times a day (QID) | ORAL | Status: DC | PRN

## 2019-04-07 MED ORDER — DEXTROSE 50 % IV SOLN
50.0000 mL | INTRAVENOUS | Status: DC | PRN
  Administered 2019-04-07: 25 mL via INTRAVENOUS
  Filled 2019-04-07: qty 50

## 2019-04-07 MED ORDER — BUPROPION HCL ER (XL) 150 MG PO TB24
150.0000 mg | ORAL_TABLET | Freq: Every day | ORAL | Status: DC
  Filled 2019-04-07 (×3): qty 1

## 2019-04-07 MED ORDER — POLYETHYLENE GLYCOL 3350 17 GM/SCOOP PO POWD
17.0000 g | Freq: Every day | ORAL | Status: DC
  Filled 2019-04-07: qty 255

## 2019-04-07 MED ORDER — DOCUSATE SODIUM 100 MG PO CAPS
100.0000 mg | ORAL_CAPSULE | Freq: Two times a day (BID) | ORAL | Status: DC
  Administered 2019-04-07 – 2019-04-08 (×2): 100 mg via ORAL
  Filled 2019-04-07 (×2): qty 1

## 2019-04-07 MED ORDER — ALBUTEROL SULFATE (2.5 MG/3ML) 0.083% IN NEBU: 2.5000 mg | INHALATION_SOLUTION | RESPIRATORY_TRACT | Status: DC | PRN

## 2019-04-07 MED ORDER — LORATADINE 10 MG PO TABS
10.0000 mg | ORAL_TABLET | Freq: Every evening | ORAL | Status: DC
  Administered 2019-04-07: 10 mg via ORAL
  Filled 2019-04-07: qty 1

## 2019-04-07 MED ORDER — LACTULOSE 10 GM/15ML PO SOLN: 10.0000 g | Freq: Every day | ORAL | Status: DC

## 2019-04-07 MED ORDER — ATORVASTATIN CALCIUM 10 MG PO TABS
10.0000 mg | ORAL_TABLET | Freq: Every day | ORAL | Status: DC
  Administered 2019-04-07: 10 mg via ORAL
  Filled 2019-04-07: qty 1

## 2019-04-07 MED ORDER — OXYCODONE-ACETAMINOPHEN 5-325 MG PO TABS: 1.0000 | ORAL_TABLET | Freq: Four times a day (QID) | ORAL | Status: DC | PRN

## 2019-04-07 MED ORDER — ALBUTEROL SULFATE HFA 108 (90 BASE) MCG/ACT IN AERS: 2.0000 | INHALATION_SPRAY | RESPIRATORY_TRACT | Status: DC | PRN

## 2019-04-07 MED ORDER — ESCITALOPRAM OXALATE 10 MG PO TABS
10.0000 mg | ORAL_TABLET | Freq: Every day | ORAL | Status: DC
  Administered 2019-04-07: 10 mg via ORAL
  Filled 2019-04-07: qty 1

## 2019-04-07 MED ORDER — ACETAMINOPHEN 325 MG PO TABS: 650.0000 mg | ORAL_TABLET | Freq: Four times a day (QID) | ORAL | Status: DC | PRN

## 2019-04-07 MED ORDER — ASPIRIN EC 325 MG PO TBEC
325.0000 mg | DELAYED_RELEASE_TABLET | Freq: Every day | ORAL | Status: DC
  Administered 2019-04-07 – 2019-04-08 (×2): 325 mg via ORAL
  Filled 2019-04-07 (×2): qty 1

## 2019-04-07 MED ORDER — PIPERACILLIN-TAZOBACTAM 3.375 G IVPB: 3.3750 g | Freq: Two times a day (BID) | INTRAVENOUS | Status: DC

## 2019-04-07 NOTE — ED Notes (Signed)
ED TO INPATIENT HANDOFF REPORT  ED Nurse Name and Phone #: Caprice Kluver [phone]  S Name/Age/Gender Christian Dalton 78 y.o. male Room/Bed: 043C/043C  Code Status   Code Status: Full Code  Home/SNF/Other Home Patient oriented to: self, place, time and situation Is this baseline? Yes   Triage Complete: Triage complete  Chief Complaint N/V Dialysis    Triage Note Pt via EMS from a [facility] with c/o Nausea/Dry heaves x 1 week. While at his visit providers noticed changes in his ECG, prompting a call to EMS. Pt was scheduled for dialysis today  but missed today's appoint due to [place] care visit. Pt denied pain and dizziness.   Allergies Allergies  Allergen Reactions  . Adhesive [Tape]     Paper tape only  . Avelox [Moxifloxacin Hcl] Other (See Comments)    Hallucinations   . Gabapentin Other (See Comments)    md dialyisis  . Metoprolol Other (See Comments)    Dropped pulse too low; was traken off  . Shellfish Allergy Nausea And Vomiting    veru nauseous and gets stomach cramps  . Sulfa Antibiotics Other (See Comments)    Mom said ro htaje   . Tetracyclines & Related Other (See Comments)    Blisters on hands and arms    Level of Care/Admitting Diagnosis ED Disposition    ED Disposition Condition [facility] Area: [place] [[id number]]  Level of Care: Telemetry Cardiac [103]  I expect the patient will be discharged within 24 hours: No (not a candidate for 5C-Observation unit)  Covid Evaluation: Screening Protocol (No Symptoms)  Diagnosis: Elevated troponin [784696]  Admitting Physician: Ivor Costa [[id number]]  Attending Physician: Ivor Costa [[id number]]  PT Class (Do Not Modify): Observation [104]  PT Acc Code (Do Not Modify): Observation [10022]       B Medical/Surgery History Past Medical History:  Diagnosis Date  . Acute cholecystitis 08/17/2018  . Atrial fibrillation ([place])   . CAD (coronary artery disease)   .  Cholecystitis   . COPD (chronic obstructive pulmonary disease) ([place])   . Diabetes mellitus without complication ([place])   . ESRD (end stage renal disease) on dialysis ([place])   . Hypertension   . OSA (obstructive sleep apnea)   . Renal disorder   . Spinal stenosis    Past Surgical History:  Procedure Laterality Date  . BILIARY STENT PLACEMENT  11/25/2018   Procedure: BILIARY STENT PLACEMENT;  Surgeon: [name], MD;  Location: [facility] ENDOSCOPY;  Service: Gastroenterology;;  . BIOPSY  12/20/2018   Procedure: BIOPSY;  Surgeon: [name], MD;  Location: [facility] ENDOSCOPY;  Service: Gastroenterology;;  . CHOLECYSTECTOMY N/A 11/14/2018   Procedure: LAPAROSCOPIC CHOLECYSTECTOMY WITH INTRAOPERATIVE CHOLANGIOGRAM;  Surgeon: Coralie Keens, MD;  Location: [place];  Service: General;  Laterality: N/A;  . ERCP N/A 11/25/2018   Procedure: ENDOSCOPIC RETROGRADE CHOLANGIOPANCREATOGRAPHY (ERCP);  Surgeon: [name], MD;  Location: [place];  Service: Gastroenterology;  Laterality: N/A;  . ESOPHAGOGASTRODUODENOSCOPY N/A 12/20/2018   Procedure: ESOPHAGOGASTRODUODENOSCOPY (EGD);  Surgeon: [name], MD;  Location: [facility] ENDOSCOPY;  Service: Gastroenterology;  Laterality: N/A;  . HAND SURGERY Right   . IR DIALY SHUNT INTRO NEEDLE/INTRACATH INITIAL W/IMG LEFT Left 08/20/2018  . IR PERC CHOLECYSTOSTOMY  08/19/2018  . IR RADIOLOGIST EVAL & MGMT  09/29/2018  . SHOULDER SURGERY Right   . SPHINCTEROTOMY  11/25/2018   Procedure: SPHINCTEROTOMY;  Surgeon: [name], MD;  Location: [place];  Service: Gastroenterology;;  . Lavell Islam  REMOVAL  12/20/2018   Procedure: STENT REMOVAL;  Surgeon: [name], MD;  Location: [facility] ENDOSCOPY;  Service: Gastroenterology;;  . TIBIA FRACTURE SURGERY       A IV Location/Drains/Wounds Patient Lines/Drains/Airways Status   Active Line/Drains/Airways    Name:   Placement date:   Placement time:   Site:   Days:   Peripheral IV 11/23/18 Right;Anterior Wrist   11/23/18    -    Wrist   135    Peripheral IV 04/07/19 Right Hand   04/07/19    1553    Hand   less than 1   Fistula / Graft Left Upper arm Arteriovenous fistula   08/17/18    1907    Upper arm   233   Fistula / Graft Left Upper arm Arteriovenous fistula   -    -    Upper arm      Closed System Drain 1 Anterior;Right RUQ Other (Comment) 10.2 Fr.   08/19/18    1605    RUQ   231   Closed System Drain 1 Right;Lateral Abdomen Bulb (JP) 19 Fr.   11/14/18    1120    Abdomen   144   Biliary Tube RUQ   -    -    RUQ      GI Stent 7 Fr.   11/25/18    1406    -   133   Incision (Closed) 11/14/18 Abdomen Other (Comment)   11/14/18    1133     144   Incision (Closed) 11/14/18 Abdomen Right   11/14/18    1137     144   Incision (Closed) 11/14/18 Abdomen Right   11/14/18    1137     144   Incision - 4 Ports Abdomen Umbilicus Medial;Upper Right;Lateral Right;Medial   11/14/18    1018     144          Intake/Output Last 24 hours No intake or output data in the 24 hours ending 04/07/19 2057  Labs/Imaging Results for orders placed or performed during the hospital encounter of 04/07/19 (from the past 48 hour(s))  Comprehensive metabolic panel     Status: Abnormal   Collection Time: 04/07/19  4:26 PM  Result Value Ref Range   Sodium 138 135 - 145 mmol/L   Potassium 4.7 3.5 - 5.1 mmol/L   Chloride 96 (L) 98 - 111 mmol/L   CO2 27 22 - 32 mmol/L   Glucose, Bld 83 70 - 99 mg/dL   BUN 45 (H) 8 - 23 mg/dL   Creatinine, Ser 8.86 (H) 0.61 - 1.24 mg/dL   Calcium 9.4 8.9 - 10.3 mg/dL   Total Protein 6.2 (L) 6.5 - 8.1 g/dL   Albumin 3.2 (L) 3.5 - 5.0 g/dL   AST 13 (L) 15 - 41 U/L   ALT 24 0 - 44 U/L   Alkaline Phosphatase 168 (H) 38 - 126 U/L   Total Bilirubin 0.7 0.3 - 1.2 mg/dL   GFR calc non Af Amer 5 (L) >60 mL/min   GFR calc Af Amer 6 (L) >60 mL/min   Anion gap 15 5 - 15    Comment: Performed at [facility], [place] [address] [place], [place] [address]  Lipase, blood     Status: None   Collection Time: 04/07/19  4:26 PM   Result Value Ref Range   Lipase 25 11 - 51 U/L    Comment: Performed at  [facility], [place] [address] [place], [place] [address]  CBC with Differential     Status: Abnormal   Collection Time: 04/07/19  4:26 PM  Result Value Ref Range   WBC 8.9 4.0 - 10.5 K/uL   RBC 4.38 4.22 - 5.81 MIL/uL   Hemoglobin 13.1 13.0 - 17.0 g/dL   HCT 41.3 39.0 - 52.0 %   MCV 94.3 80.0 - 100.0 fL   MCH 29.9 26.0 - 34.0 pg   MCHC 31.7 30.0 - 36.0 g/dL   RDW 14.8 11.5 - 15.5 %   Platelets 402 (H) 150 - 400 K/uL   nRBC 0.0 0.0 - 0.2 %   Neutrophils Relative % 74 %   Neutro Abs 6.6 1.7 - 7.7 K/uL   Lymphocytes Relative 17 %   Lymphs Abs 1.5 0.7 - 4.0 K/uL   Monocytes Relative 6 %   Monocytes Absolute 0.6 0.1 - 1.0 K/uL   Eosinophils Relative 1 %   Eosinophils Absolute 0.1 0.0 - 0.5 K/uL   Basophils Relative 1 %   Basophils Absolute 0.1 0.0 - 0.1 K/uL   Immature Granulocytes 1 %   Abs Immature Granulocytes 0.06 0.00 - 0.07 K/uL    Comment: Performed at [facility] [address] [place], [place] [address]  Troponin I - Once     Status: Abnormal   Collection Time: 04/07/19  4:26 PM  Result Value Ref Range   Troponin I 0.03 (HH) <0.03 ng/mL    Comment: CRITICAL RESULT CALLED TO, READ BACK BY AND VERIFIED WITH: S Baxter Gonzalez,RN 1718 04/07/2019 D BRADLEY Performed at [facility], [place] [address] [place], [place] [address]   CBG monitoring, ED     Status: Abnormal   Collection Time: 04/07/19  6:15 PM  Result Value Ref Range   Glucose-Capillary 58 (L) 70 - 99 mg/dL   Comment 1 Notify RN    Comment 2 Document in Chart   CBG monitoring, ED     Status: Abnormal   Collection Time: 04/07/19  7:58 PM  Result Value Ref Range   Glucose-Capillary 102 (H) 70 - 99 mg/dL  Lactic acid, plasma     Status: None   Collection Time: 04/07/19  8:11 PM  Result Value Ref Range   Lactic Acid, Venous 1.0 0.5 - 1.9 mmol/L    Comment: Performed at [facility] [address] [place], [place]  [address]   Ct Abdomen Pelvis Wo Contrast  Result Date: 04/07/2019 CLINICAL DATA:  Nausea for 1 week. History of cholecystectomy 11/02/2018. The patient underwent subsequent placement of a common bile stent which was removed 12/20/2018. EXAM: CT ABDOMEN AND PELVIS WITHOUT CONTRAST TECHNIQUE: Multidetector CT imaging of the abdomen and pelvis was performed following the standard protocol without IV contrast. COMPARISON:  CT abdomen and pelvis 11/11/2018. FINDINGS: Lower chest: No pleural or pericardial effusion. Lung bases clear. Heart size normal. Hepatobiliary: There is diffuse fatty infiltration of the liver. Mild pneumobilia is consistent with prior sphincterotomy and common bile duct stent. There is diffuse fatty infiltration of the liver. A locule of air is seen in the gallbladder fossa measuring 1.8 cm in diameter. There is stranding and loss of soft tissue planes in the gallbladder fossa. Hypoattenuation in the liver parenchyma surrounds the locule of air. Pancreas: Unremarkable. No pancreatic ductal dilatation or surrounding inflammatory changes. Spleen: Normal in size without focal abnormality. Adrenals/Urinary Tract: Small bilateral renal cysts are unchanged. Ureters and urinary bladder appear normal.  Tiny left adrenal adenoma is noted. The right adrenal gland appears normal. Stomach/Bowel: Stomach is within normal limits. Appendix appears normal. No evidence of bowel wall thickening, distention, or inflammatory changes. Vascular/Lymphatic: Aortic atherosclerosis. No enlarged abdominal or pelvic lymph nodes. Reproductive: Prostate is unremarkable. Other: Small fat containing right inguinal hernia noted. Musculoskeletal: Patient has bilateral L5 pars interarticularis defects without anterolisthesis. No acute abnormality. No lytic or sclerotic lesion. IMPRESSION: Locule of air in the gallbladder fossa with surrounding stranding and some hypoattenuation adjacent liver parenchyma is nonspecific but could be  due to a small abscess in the gallbladder fossa, pneumobilia within a cystic duct remnant or pyloric/duodenal ulcer. Repeat CT with oral and IV contrast is recommended for further evaluation. Atherosclerosis. Small fat containing right inguinal hernia. Electronically Signed   By: Inge Rise M.D.   On: 04/07/2019 19:21   Dg Chest Port 1 View  Result Date: 04/07/2019 CLINICAL DATA:  Nausea. EXAM: PORTABLE CHEST 1 VIEW COMPARISON:  Chest x-ray dated November 11, 2018. FINDINGS: The heart size and mediastinal contours are within normal limits. Both lungs are clear. The visualized skeletal structures are unremarkable. IMPRESSION: No active disease. Electronically Signed   By: Titus Dubin M.D.   On: 04/07/2019 17:44    Pending Labs Unresulted Labs (From admission, onward)    Start     Ordered   04/08/19 0500  Hemoglobin A1c  Tomorrow morning,   R     04/07/19 2026   04/08/19 0500  Lipid panel  Tomorrow morning,   R    Comments:  Please obtain as a fasting lipid panel - should not have eaten/ drank food for 8 hours prior to labs.    04/07/19 2026   04/07/19 2027  Rapid urine drug screen (hospital performed)  Once,   R     04/07/19 2026   04/07/19 2027  Troponin I - Now Then Q6H  Now then every 6 hours,   R     04/07/19 2026   04/07/19 2026  SARS Coronavirus 2 (CEPHEID - Performed in [place] hospital lab), [facility] Order  (Asymptomatic Patients Labs)  Once,   R    Question:  Rule Out  Answer:  Yes   04/07/19 2025   04/07/19 1935  Lactic acid, plasma  Now then every 2 hours,   STAT     04/07/19 1934   04/07/19 1933  Culture, blood (routine x 2)  BLOOD CULTURE X 2,   STAT     04/07/19 1934   04/07/19 1928  Troponin I - Once  Once,   STAT     04/07/19 1927          Vitals/Pain Today's Vitals   04/07/19 1557 04/07/19 1558 04/07/19 1700 04/07/19 1930  BP: (!) 152/76  128/79 128/84  Pulse: 82  74 85  Resp: 20  16 18   Temp: 98.7 F (37.1 C)     TempSrc: Oral     SpO2: 100%  94%  96%  Weight:  113.4 kg    Height:  5\' 10"  (1.778 m)    PainSc: 0-No pain       Isolation Precautions No active isolations  Medications Medications  aspirin EC tablet 325 mg (has no administration in time range)  morphine 2 MG/ML injection 1 mg (has no administration in time range)  ondansetron (ZOFRAN) injection 4 mg (has no administration in time range)  acetaminophen (TYLENOL) tablet 650 mg (has no administration in time range)  heparin injection 5,000  Units (has no administration in time range)  dextrose 50 % solution 50 mL (has no administration in time range)  insulin aspart (novoLOG) injection 0-9 Units (has no administration in time range)    Mobility walks Low fall risk   Focused Assessments Respiratory    Lab Results  Component Value Date   TROPONINI 0.03 ([place]) 04/07/2019   No results found for: DDIMER Does the Patient currently have chest pain? No     R Recommendations: See Admitting Provider Note  Report given to:   Additional Notes:

## 2019-04-07 NOTE — ED Provider Notes (Signed)
[facility] EMERGENCY DEPARTMENT Provider Note   CSN: [id number] Arrival date & time: 04/07/19  1546    History   Chief Complaint Chief Complaint  Patient presents with   Nausea    HPI Christian Dalton is a 78 y.o. male with a past medical history of ESRD on HD Tuesday Thursday Saturday, COPD, DM, A. fib, status post cholecystitis with biliary leak, who presents today for evaluation of nausea and dry heaves.  He reports that for approximately 1 week he has had significant nausea and frequent dry heaves.  He went to urgent care today near [facility] when he reportedly had EKG changes and EMS was called to brought him here.  He reports that he missed dialysis today due to his urgent care visit.  He denies any chest pain or shortness of breath.  He denies abdominal pain.  His last oral intake was approximately 3 hours ago which was orange juice.  He denies actual vomiting or diarrhea.  During these episodes of dry heaves he gets very diaphoretic.      HPI  Past Medical History:  Diagnosis Date   Acute cholecystitis 08/17/2018   Atrial fibrillation (HCC)    CAD (coronary artery disease)    Cholecystitis    COPD (chronic obstructive pulmonary disease) (HCC)    Diabetes mellitus without complication (HCC)    ESRD (end stage renal disease) on dialysis ([place])    Hypertension    OSA (obstructive sleep apnea)    Renal disorder    Spinal stenosis     Patient Active Problem List   Diagnosis Date Noted   Type 2 diabetes mellitus with hemoglobin A1c goal of less than 7.0% (HCC)    Bile leak, postoperative 11/23/2018   Choledocholithiasis 11/11/2018   ESRD (end stage renal disease) on dialysis ([place]) 08/17/2018   DM2 (diabetes mellitus, type 2) ([place]) 08/17/2018   HTN (hypertension) 08/17/2018   CAD (coronary artery disease) 08/17/2018   Obesity, Class III, BMI 40-49.9 (morbid obesity) ([place]) 08/17/2018    Past Surgical History:  Procedure Laterality Date     BILIARY STENT PLACEMENT  11/25/2018   Procedure: BILIARY STENT PLACEMENT;  Surgeon: [name], MD;  Location: [facility] ENDOSCOPY;  Service: Gastroenterology;;   BIOPSY  12/20/2018   Procedure: BIOPSY;  Surgeon: [name], MD;  Location: [facility] ENDOSCOPY;  Service: Gastroenterology;;   CHOLECYSTECTOMY N/A 11/14/2018   Procedure: LAPAROSCOPIC CHOLECYSTECTOMY WITH INTRAOPERATIVE CHOLANGIOGRAM;  Surgeon: Coralie Keens, MD;  Location: [facility];  Service: General;  Laterality: N/A;   ERCP N/A 11/25/2018   Procedure: ENDOSCOPIC RETROGRADE CHOLANGIOPANCREATOGRAPHY (ERCP);  Surgeon: [name], MD;  Location: [facility];  Service: Gastroenterology;  Laterality: N/A;   ESOPHAGOGASTRODUODENOSCOPY N/A 12/20/2018   Procedure: ESOPHAGOGASTRODUODENOSCOPY (EGD);  Surgeon: [name], MD;  Location: [facility] ENDOSCOPY;  Service: Gastroenterology;  Laterality: N/A;   HAND SURGERY Right    IR DIALY SHUNT INTRO NEEDLE/INTRACATH INITIAL W/IMG LEFT Left 08/20/2018   IR PERC CHOLECYSTOSTOMY  08/19/2018   IR RADIOLOGIST EVAL & MGMT  09/29/2018   SHOULDER SURGERY Right    SPHINCTEROTOMY  11/25/2018   Procedure: SPHINCTEROTOMY;  Surgeon: [name], MD;  Location: [facility] ENDOSCOPY;  Service: Gastroenterology;;   Lavell Islam REMOVAL  12/20/2018   Procedure: STENT REMOVAL;  Surgeon: [name], MD;  Location: [facility] ENDOSCOPY;  Service: Gastroenterology;;   TIBIA FRACTURE SURGERY          Home Medications    Prior to Admission medications   Medication Sig Start Date End Date Taking? Authorizing  Provider  aspirin EC 81 MG tablet Take 81 mg by mouth every other day.     [provider]  atorvastatin (LIPITOR) 20 MG tablet Take 1 tablet (20 mg total) by mouth every evening. Patient taking differently: Take 10 mg by mouth every evening.  08/24/18   Elgergawy, Silver Huguenin, MD  buPROPion (WELLBUTRIN XL) 150 MG 24 hr tablet Take 150 mg by mouth at bedtime.     [provider]  docusate sodium (COLACE) 100 MG capsule  Take 100 mg by mouth 2 (two) times daily.     [provider]  escitalopram (LEXAPRO) 10 MG tablet Take 10 mg every evening by mouth. 08/30/15   [provider]  gabapentin (NEURONTIN) 300 MG capsule Take 300 mg by mouth at bedtime.    [provider]  insulin detemir (LEVEMIR) 100 UNIT/ML injection Inject 0.14 mLs (14 Units total) into the skin 2 (two) times daily. Patient taking differently: Inject 16-20 Units into the skin See admin instructions. Sliding scale 20 mg in the morning and 16 in the evening 08/24/18   Elgergawy, Silver Huguenin, MD  Insulin Lispro Prot & Lispro (HUMALOG MIX 75/25 KWIKPEN) (75-25) 100 UNIT/ML Kwikpen Take 10 Units by mouth 2 (two) times daily. 07/12/18   [provider]  lactulose, encephalopathy, (CHRONULAC) 10 GM/15ML SOLN Take 15 mLs by mouth daily before supper.  08/02/18   [provider]  levothyroxine (SYNTHROID, LEVOTHROID) 125 MCG tablet Take 125 mcg daily by mouth.    [provider]  lidocaine-prilocaine (EMLA) cream Apply 1 application topically See admin instructions. To access site (AVF) 1-2 hours before dialysis. Cover with occlusive dressing (Saran warp) 08/02/18   [provider]  linaclotide (LINZESS) 290 MCG CAPS capsule Take 290 mcg by mouth daily after supper.     [provider]  loratadine (CLARITIN) 10 MG tablet Take 10 mg every evening by mouth.    [provider]  midodrine (PROAMATINE) 2.5 MG tablet Take 2.5 mg by mouth 3 (three) times daily. 09/20/18   [provider]  montelukast (SINGULAIR) 10 MG tablet Take 10 mg every evening by mouth.    [provider]  sevelamer carbonate (RENVELA) 800 MG tablet Take 3 tablets (2,400 mg total) by mouth 3 (three) times daily with meals. Patient taking differently: Take 2,400 mg by mouth 2 (two) times daily.  08/24/18   Elgergawy, Silver Huguenin, MD    Family History Family History  Problem Relation Age of Onset   Heart  disease Mother    Heart disease Father    Heart disease Sister     Social History Social History   Tobacco Use   Smoking status: Never Smoker   Smokeless tobacco: Former Systems developer  Substance Use Topics   Alcohol use: Not Currently   Drug use: No     Allergies   Avelox [moxifloxacin hcl]; Shellfish allergy; Sulfa antibiotics; and Tetracyclines & related   Review of Systems Review of Systems  Constitutional: Positive for appetite change and diaphoresis. Negative for fatigue and fever.  HENT: Negative for congestion.   Respiratory: Negative for chest tightness and shortness of breath.   Cardiovascular: Negative for chest pain.  Gastrointestinal: Positive for nausea. Negative for abdominal pain, diarrhea and vomiting.  Genitourinary:       Still makes "a little" urine  Musculoskeletal: Negative for back pain and neck pain.  Neurological: Negative for weakness and headaches.  Psychiatric/Behavioral: Negative for confusion.  All other systems reviewed and are  negative.    Physical Exam Updated Vital Signs BP (!) 152/76    Pulse 82    Temp 98.7 F (37.1 C) (Oral)    Resp 20    Ht 5\' 10"  (1.778 m)    Wt 113.4 kg    SpO2 100%    BMI 35.87 kg/m   Physical Exam Vitals signs and nursing note reviewed.  Constitutional:      General: He is not in acute distress.    Appearance: He is well-developed.  HENT:     Head: Normocephalic and atraumatic.     Nose: Nose normal.     Mouth/Throat:     Mouth: Mucous membranes are moist.  Eyes:     Conjunctiva/sclera: Conjunctivae normal.  Neck:     Musculoskeletal: Normal range of motion and neck supple. No neck rigidity.  Cardiovascular:     Rate and Rhythm: Normal rate and regular rhythm.     Heart sounds: Normal heart sounds. No murmur.  Pulmonary:     Effort: Pulmonary effort is normal. No respiratory distress.     Breath sounds: Normal breath sounds.  Abdominal:     General: Bowel sounds are normal. There is no distension.       Palpations: Abdomen is soft. There is no mass.     Tenderness: There is no abdominal tenderness.     Hernia: No hernia is present.  Musculoskeletal: Normal range of motion.     Right lower leg: No edema.     Left lower leg: No edema.  Skin:    General: Skin is warm and dry.  Neurological:     General: No focal deficit present.     Mental Status: He is alert and oriented to person, place, and time.  Psychiatric:        Mood and Affect: Mood normal.        Behavior: Behavior normal.      ED Treatments / Results  Labs (all labs ordered are listed, but only abnormal results are displayed) Labs Reviewed  COMPREHENSIVE METABOLIC PANEL - Abnormal; Notable for the following components:      Result Value   Chloride 96 (*)    BUN 45 (*)    Creatinine, Ser 8.86 (*)    Total Protein 6.2 (*)    Albumin 3.2 (*)    AST 13 (*)    Alkaline Phosphatase 168 (*)    GFR calc non Af Amer 5 (*)    GFR calc Af Amer 6 (*)    All other components within normal limits  CBC WITH DIFFERENTIAL/PLATELET - Abnormal; Notable for the following components:   Platelets 402 (*)    All other components within normal limits  TROPONIN I - Abnormal; Notable for the following components:   Troponin I 0.03 (*)    All other components within normal limits  TROPONIN I - Abnormal; Notable for the following components:   Troponin I 0.03 (*)    All other components within normal limits  CBG MONITORING, ED - Abnormal; Notable for the following components:   Glucose-Capillary 58 (*)    All other components within normal limits  CBG MONITORING, ED - Abnormal; Notable for the following components:   Glucose-Capillary 102 (*)    All other components within normal limits  CULTURE, BLOOD (ROUTINE X 2)  CULTURE, BLOOD (ROUTINE X 2)  SARS CORONAVIRUS 2 (HOSPITAL ORDER, [place] [facility])  LIPASE, BLOOD  LACTIC ACID, PLASMA  LACTIC ACID, PLASMA  RAPID URINE DRUG SCREEN, HOSP PERFORMED   HEMOGLOBIN A1C  LIPID PANEL  TROPONIN I  TROPONIN I  TROPONIN I    EKG EKG Interpretation  Date/Time:  Thursday Apr 07 2019 16:21:13 EDT Ventricular Rate:  76 PR Interval:    QRS Duration: 105 QT Interval:  415 QTC Calculation: 467 R Axis:   -33 Text Interpretation:  Sinus rhythm Consider left atrial enlargement Left axis deviation Abnormal T, consider ischemia, diffuse leads Confirmed by Quintella Reichert [phone]) on 04/07/2019 4:25:04 PM   Radiology Ct Abdomen Pelvis Wo Contrast  Result Date: 04/07/2019 CLINICAL DATA:  Nausea for 1 week. History of cholecystectomy 11/02/2018. The patient underwent subsequent placement of a common bile stent which was removed 12/20/2018. EXAM: CT ABDOMEN AND PELVIS WITHOUT CONTRAST TECHNIQUE: Multidetector CT imaging of the abdomen and pelvis was performed following the standard protocol without IV contrast. COMPARISON:  CT abdomen and pelvis 11/11/2018. FINDINGS: Lower chest: No pleural or pericardial effusion. Lung bases clear. Heart size normal. Hepatobiliary: There is diffuse fatty infiltration of the liver. Mild pneumobilia is consistent with prior sphincterotomy and common bile duct stent. There is diffuse fatty infiltration of the liver. A locule of air is seen in the gallbladder fossa measuring 1.8 cm in diameter. There is stranding and loss of soft tissue planes in the gallbladder fossa. Hypoattenuation in the liver parenchyma surrounds the locule of air. Pancreas: Unremarkable. No pancreatic ductal dilatation or surrounding inflammatory changes. Spleen: Normal in size without focal abnormality. Adrenals/Urinary Tract: Small bilateral renal cysts are unchanged. Ureters and urinary bladder appear normal. Tiny left adrenal adenoma is noted. The right adrenal gland appears normal. Stomach/Bowel: Stomach is within normal limits. Appendix appears normal. No evidence of bowel wall thickening, distention, or inflammatory changes. Vascular/Lymphatic: Aortic  atherosclerosis. No enlarged abdominal or pelvic lymph nodes. Reproductive: Prostate is unremarkable. Other: Small fat containing right inguinal hernia noted. Musculoskeletal: Patient has bilateral L5 pars interarticularis defects without anterolisthesis. No acute abnormality. No lytic or sclerotic lesion. IMPRESSION: Locule of air in the gallbladder fossa with surrounding stranding and some hypoattenuation adjacent liver parenchyma is nonspecific but could be due to a small abscess in the gallbladder fossa, pneumobilia within a cystic duct remnant or pyloric/duodenal ulcer. Repeat CT with oral and IV contrast is recommended for further evaluation. Atherosclerosis. Small fat containing right inguinal hernia. Electronically Signed   By: Inge Rise M.D.   On: 04/07/2019 19:21   Dg Chest Port 1 View  Result Date: 04/07/2019 CLINICAL DATA:  Nausea. EXAM: PORTABLE CHEST 1 VIEW COMPARISON:  Chest x-ray dated November 11, 2018. FINDINGS: The heart size and mediastinal contours are within normal limits. Both lungs are clear. The visualized skeletal structures are unremarkable. IMPRESSION: No active disease. Electronically Signed   By: Titus Dubin M.D.   On: 04/07/2019 17:44    Procedures Procedures (including critical care time)  Medications Ordered in ED Medications - No data to display   Initial Impression / Assessment and Plan / ED Course  I have reviewed the triage vital signs and the nursing notes.  Pertinent labs & imaging results that were available during my care of the patient were reviewed by me and considered in my medical decision making (see chart for details).  Clinical Course as of Apr 06 2137  Thu Apr 07, 2019  1947 Spoke with surgery Dr. Brantley Stage who feels that the air is actually in the liver and is all consistent with his previous stent.  He does not feel that patient needs  antibiotics at this time, recommended further evaluation of the nausea given the fact that patient is  afebrile, not tachycardic or tachypneic with a normal white count.   [EH]    Clinical Course User Index [EH] Lorin Glass, PA-C      Patient is a 78 year old man who presents today for evaluation of approximately 1 week of nausea.  He went to an urgent care who felt like his EKG was abnormal and sent him here for further evaluation.  His ED does appear changed from before, however is not clearly ischemic.  Protein is slightly elevated at 0.03, however he missed dialysis today.  He is not having any chest pain or shortness of breath.  No abdominal pain just continued nausea.  Based on his history CT scan Noncon was obtained to evaluate for obstruction or other cause of his nausea.  CT scan showed concern for a locule of air in the gallbladder fossa with surrounding stranding.  I spoke with on-call surgery Dr. Brantley Stage who felt that this area was actually in the liver, is all consistent with his previous gallbladder/liver procedures, does not recommend antibiotics at this time recommends pursuing other causes for his nausea.  Patient was slightly hypoglycemic on arrival however was maintaining his mental status okay.  He was treated with juice after which his sugar improved.  His creatinine is elevated at 8.86, which is up from his baseline however he did miss dialysis today.  He is not significantly hyperkalemic.  His chloride is slightly low at 96 however he does not have any other significant electrolyte or hematologic derangements.  Based on abnormal EKG will admit patient.  This patient was seen as a shared visit with Dr. Ralene Bathe.  I spoke with Dr. Blaine Hamper who agreed to admit the patient.   Final Clinical Impressions(s) / ED Diagnoses   Final diagnoses:  Nausea  Abnormal EKG    ED Discharge Orders    None       Ollen Gross 04/07/19 2142    Quintella Reichert, MD 04/08/19 1047

## 2019-04-07 NOTE — H&P (Addendum)
History and Physical    Christian Dalton UVO:[id number] DOB: 10/30/1941 DOA: 04/07/2019  Referring MD/NP/PA:   PCP: Maris Berger, MD   Patient coming from:  The patient is coming from home.  At baseline, pt is independent for most of ADL.        Chief Complaint: Nausea and dry heaves  HPI: Christian Dalton is a 78 y.o. male with medical history significant of hypertension, hyperlipidemia, diabetes mellitus, COPD, hypothyroidism, depression, atrial fibrillation not on anticoagulants, CAD, ESRD-H D (TTS), OSA not on CPAP, status post cholecystitis with biliary leak, who presents with nausea and dry heaves.  Patient states that he has been having intermittent nausea, dry heaves for almost a week.  No vomiting, diarrhea or abdominal pain.  No fever or chills.  Patient denies any chest pain, shortness of breath, cough.  No symptoms of UTI or unilateral weakness. He went to urgent care today near [facility] when he reportedly had EKG changes and EMS was called to brought to ED. He reports that he missed dialysis today due to his urgent care visit.  ED Course: pt was found to have troponin 0.03, potassium 4.7, bicarbonate 27, creatinine 8.86, BUN 45, temperature normal, no tachycardia, oxygen saturation 94% on room air.  Chest x-ray negative.  EKG showed new T wave inversion in V4-V6.  # CT-abd/pelvis: 1. Locule of air in the gallbladder fossa with surrounding stranding and some hypoattenuation adjacent liver parenchyma is nonspecific but could be due to a small abscess in the gallbladder fossa, pneumobilia within a cystic duct remnant or pyloric/duodenal ulcer. 2. Small fat containing right inguinal hernia.  Review of Systems:   General: no fevers, chills, no body weight gain, fatigue HEENT: no blurry vision, hearing changes or sore throat Respiratory: no dyspnea, coughing, wheezing CV: no chest pain, no palpitations GI: has nausea, no vomiting, abdominal pain, diarrhea, constipation GU: no dysuria,  burning on urination, increased urinary frequency, hematuria  Ext: no leg edema Neuro: no unilateral weakness, numbness, or tingling, no vision change or hearing loss Skin: no rash, no skin tear. MSK: No muscle spasm, no deformity, no limitation of range of movement in spin Heme: No easy bruising.  Travel history: No recent long distant travel.  Allergy:  Allergies  Allergen Reactions   Avelox [Moxifloxacin Hcl] Other (See Comments)    Hallucinations    Gabapentin Other (See Comments)    MD has suspended this because he's a dialysis patient   Metoprolol Other (See Comments)    Dropped pulse too low; was taken off of this by MD   Shellfish Allergy Nausea And Vomiting and Other (See Comments)    Made patient VERY nauseous and he developed stomach cramps   Sulfa Antibiotics Other (See Comments)    Allergy is from childhood (reaction not recalled)   Tetracyclines & Related Other (See Comments)    Blisters on hands and arms   Adhesive [Tape] Itching and Rash    Paper tape only- skin cannot tolerate "heavy" tapes    Past Medical History:  Diagnosis Date   Acute cholecystitis 08/17/2018   Atrial fibrillation (HCC)    CAD (coronary artery disease)    Cholecystitis    COPD (chronic obstructive pulmonary disease) ([place])    Diabetes mellitus without complication (HCC)    ESRD (end stage renal disease) on dialysis ([place])    Hypertension    OSA (obstructive sleep apnea)    Renal disorder    Spinal stenosis     Past Surgical History:  Procedure Laterality Date   BILIARY STENT PLACEMENT  11/25/2018   Procedure: BILIARY STENT PLACEMENT;  Surgeon: [name], MD;  Location: [facility] ENDOSCOPY;  Service: Gastroenterology;;   BIOPSY  12/20/2018   Procedure: BIOPSY;  Surgeon: [name], MD;  Location: [facility] ENDOSCOPY;  Service: Gastroenterology;;   CHOLECYSTECTOMY N/A 11/14/2018   Procedure: LAPAROSCOPIC CHOLECYSTECTOMY WITH INTRAOPERATIVE CHOLANGIOGRAM;  Surgeon: Coralie Keens, MD;  Location: [place];  Service: General;  Laterality: N/A;   ERCP N/A 11/25/2018   Procedure: ENDOSCOPIC RETROGRADE CHOLANGIOPANCREATOGRAPHY (ERCP);  Surgeon: [name], MD;  Location: [place];  Service: Gastroenterology;  Laterality: N/A;   ESOPHAGOGASTRODUODENOSCOPY N/A 12/20/2018   Procedure: ESOPHAGOGASTRODUODENOSCOPY (EGD);  Surgeon: [name], MD;  Location: [facility] ENDOSCOPY;  Service: Gastroenterology;  Laterality: N/A;   HAND SURGERY Right    IR DIALY SHUNT INTRO NEEDLE/INTRACATH INITIAL W/IMG LEFT Left 08/20/2018   IR PERC CHOLECYSTOSTOMY  08/19/2018   IR RADIOLOGIST EVAL & MGMT  09/29/2018   SHOULDER SURGERY Right    SPHINCTEROTOMY  11/25/2018   Procedure: SPHINCTEROTOMY;  Surgeon: [name], MD;  Location: [facility] ENDOSCOPY;  Service: Gastroenterology;;   Lavell Islam REMOVAL  12/20/2018   Procedure: STENT REMOVAL;  Surgeon: [name], MD;  Location: [facility] ENDOSCOPY;  Service: Gastroenterology;;   TIBIA FRACTURE SURGERY      Social History:  reports that he has never smoked. He has quit using smokeless tobacco. He reports previous alcohol use. He reports that he does not use drugs.  Family History:  Family History  Problem Relation Age of Onset   Heart disease Mother    Heart disease Father    Heart disease Sister      Prior to Admission medications   Medication Sig Start Date End Date Taking? Authorizing Provider  aspirin EC 81 MG tablet Take 81 mg by mouth every other day.     [provider]  atorvastatin (LIPITOR) 20 MG tablet Take 1 tablet (20 mg total) by mouth every evening. Patient taking differently: Take 10 mg by mouth every evening.  08/24/18   Elgergawy, Silver Huguenin, MD  buPROPion (WELLBUTRIN XL) 150 MG 24 hr tablet Take 150 mg by mouth at bedtime.     [provider]  docusate sodium (COLACE) 100 MG capsule Take 100 mg by mouth 2 (two) times daily.     [provider]  escitalopram (LEXAPRO) 10 MG tablet Take 10 mg every evening  by mouth. 08/30/15   [provider]  gabapentin (NEURONTIN) 300 MG capsule Take 300 mg by mouth at bedtime.    [provider]  insulin detemir (LEVEMIR) 100 UNIT/ML injection Inject 0.14 mLs (14 Units total) into the skin 2 (two) times daily. Patient taking differently: Inject 16-20 Units into the skin See admin instructions. Sliding scale 20 mg in the morning and 16 in the evening 08/24/18   Elgergawy, Silver Huguenin, MD  Insulin Lispro Prot & Lispro (HUMALOG MIX 75/25 KWIKPEN) (75-25) 100 UNIT/ML Kwikpen Take 10 Units by mouth 2 (two) times daily. 07/12/18   [provider]  lactulose, encephalopathy, (CHRONULAC) 10 GM/15ML SOLN Take 15 mLs by mouth daily before supper.  08/02/18   [provider]  levothyroxine (SYNTHROID, LEVOTHROID) 125 MCG tablet Take 125 mcg daily by mouth.    [provider]  lidocaine-prilocaine (EMLA) cream Apply 1 application topically See admin instructions. To access site (AVF) 1-2 hours before dialysis. Cover with occlusive dressing (Saran warp) 08/02/18   [provider]  linaclotide Rolan Lipa) 290 MCG CAPS capsule  Take 290 mcg by mouth daily after supper.     [provider]  loratadine (CLARITIN) 10 MG tablet Take 10 mg every evening by mouth.    [provider]  midodrine (PROAMATINE) 2.5 MG tablet Take 2.5 mg by mouth 3 (three) times daily. 09/20/18   [provider]  montelukast (SINGULAIR) 10 MG tablet Take 10 mg every evening by mouth.    [provider]  sevelamer carbonate (RENVELA) 800 MG tablet Take 3 tablets (2,400 mg total) by mouth 3 (three) times daily with meals. Patient taking differently: Take 2,400 mg by mouth 2 (two) times daily.  08/24/18   Elgergawy, Silver Huguenin, MD    Physical Exam: Vitals:   04/07/19 1558 04/07/19 1700 04/07/19 1930 04/07/19 2218  BP:  128/79 128/84 (!) 142/83  Pulse:  74 85 76  Resp:  16 18 18   Temp:    98.5 F (36.9 C)  TempSrc:    Oral  SpO2:   94% 96% 98%  Weight: 113.4 kg   113 kg  Height: 5\' 10"  (1.778 m)      General: Not in acute distress HEENT:       Eyes: PERRL, EOMI, no scleral icterus.       ENT: No discharge from the ears and nose, no pharynx injection, no tonsillar enlargement.        Neck: No JVD, no bruit, no mass felt. Heme: No neck lymph node enlargement. Cardiac: S1/S2, RRR, No murmurs, No gallops or rubs. Respiratory: No rales, wheezing, rhonchi or rubs. GI: Soft, nondistended, nontender, no rebound pain, no organomegaly, BS present. GU: No hematuria Ext: No pitting leg edema bilaterally. 2+DP/PT pulse bilaterally. Musculoskeletal: No joint deformities, No joint redness or warmth, no limitation of ROM in spin. Skin: No rashes.  Neuro: Alert, oriented X3, cranial nerves II-XII grossly intact, moves all extremities normally. Psych: Patient is not psychotic, no suicidal or hemocidal ideation.  Labs on Admission: I have personally reviewed following labs and imaging studies  CBC: Recent Labs  Lab 04/07/19 1626  WBC 8.9  NEUTROABS 6.6  HGB 13.1  HCT 41.3  MCV 94.3  PLT 941*   Basic Metabolic Panel: Recent Labs  Lab 04/07/19 1626  NA 138  K 4.7  CL 96*  CO2 27  GLUCOSE 83  BUN 45*  CREATININE 8.86*  CALCIUM 9.4   GFR: Estimated Creatinine Clearance: 8.9 mL/min (A) (by C-G formula based on SCr of 8.86 mg/dL (H)). Liver Function Tests: Recent Labs  Lab 04/07/19 1626  AST 13*  ALT 24  ALKPHOS 168*  BILITOT 0.7  PROT 6.2*  ALBUMIN 3.2*   Recent Labs  Lab 04/07/19 1626  LIPASE 25   No results for input(s): AMMONIA in the last 168 hours. Coagulation Profile: No results for input(s): INR, PROTIME in the last 168 hours. Cardiac Enzymes: Recent Labs  Lab 04/07/19 1626 04/07/19 2011 04/07/19 2223  TROPONINI 0.03* 0.03* <0.03   BNP (last 3 results) No results for input(s): PROBNP in the last 8760 hours. HbA1C: No results for input(s): HGBA1C in the last 72 hours. CBG: Recent  Labs  Lab 04/07/19 1815 04/07/19 1958 04/07/19 2240 04/07/19 2346  GLUCAP 58* 102* 68* 151*   Lipid Profile: No results for input(s): CHOL, HDL, LDLCALC, TRIG, CHOLHDL, LDLDIRECT in the last 72 hours. Thyroid Function Tests: No results for input(s): TSH, T4TOTAL, FREET4, T3FREE, THYROIDAB in the last 72 hours. Anemia Panel: No results for input(s): VITAMINB12, FOLATE, FERRITIN, TIBC, IRON, RETICCTPCT in  the last 72 hours. Urine analysis: No results found for: COLORURINE, APPEARANCEUR, LABSPEC, PHURINE, GLUCOSEU, HGBUR, BILIRUBINUR, KETONESUR, PROTEINUR, UROBILINOGEN, NITRITE, LEUKOCYTESUR Sepsis Labs: @LABRCNTIP (procalcitonin:4,lacticidven:4) ) Recent Results (from the past 240 hour(s))  SARS Coronavirus 2 (CEPHEID - Performed in [place] hospital lab), Hosp Order     Status: None   Collection Time: 04/07/19  8:48 PM  Result Value Ref Range Status   SARS Coronavirus 2 NEGATIVE NEGATIVE Final    Comment: (NOTE) If result is NEGATIVE SARS-CoV-2 target nucleic acids are NOT DETECTED. The SARS-CoV-2 RNA is generally detectable in upper and lower  respiratory specimens during the acute phase of infection. The lowest  concentration of SARS-CoV-2 viral copies this assay can detect is 250  copies / mL. A negative result does not preclude SARS-CoV-2 infection  and should not be used as the sole basis for treatment or other  patient management decisions.  A negative result may occur with  improper specimen collection / handling, submission of specimen other  than nasopharyngeal swab, presence of viral mutation(s) within the  areas targeted by this assay, and inadequate number of viral copies  (<250 copies / mL). A negative result must be combined with clinical  observations, patient history, and epidemiological information. If result is POSITIVE SARS-CoV-2 target nucleic acids are DETECTED. The SARS-CoV-2 RNA is generally detectable in upper and lower  respiratory specimens dur ing  the acute phase of infection.  Positive  results are indicative of active infection with SARS-CoV-2.  Clinical  correlation with patient history and other diagnostic information is  necessary to determine patient infection status.  Positive results do  not rule out bacterial infection or co-infection with other viruses. If result is PRESUMPTIVE POSTIVE SARS-CoV-2 nucleic acids MAY BE PRESENT.   A presumptive positive result was obtained on the submitted specimen  and confirmed on repeat testing.  While 2019 novel coronavirus  (SARS-CoV-2) nucleic acids may be present in the submitted sample  additional confirmatory testing may be necessary for epidemiological  and / or clinical management purposes  to differentiate between  SARS-CoV-2 and other Sarbecovirus currently known to infect humans.  If clinically indicated additional testing with an alternate test  methodology [phone]) is advised. The SARS-CoV-2 RNA is generally  detectable in upper and lower respiratory sp ecimens during the acute  phase of infection. The expected result is Negative. Fact Sheet for Patients:  StrictlyIdeas.no Fact Sheet for Healthcare Providers: BankingDealers.co.za This test is not yet approved or cleared by the [place] FDA and has been authorized for detection and/or diagnosis of SARS-CoV-2 by FDA under an Emergency Use Authorization (EUA).  This EUA will remain in effect (meaning this test can be used) for the duration of the COVID-19 declaration under Section 564(b)(1) of the Act, 21 U.S.C. section 360bbb-3(b)(1), unless the authorization is terminated or revoked sooner. Performed at [facility], [place] [address], [place], [place] [address]   MRSA PCR Screening     Status: None   Collection Time: 04/07/19 10:31 PM  Result Value Ref Range Status   MRSA by PCR NEGATIVE NEGATIVE Final    Comment:        The GeneXpert MRSA Assay (FDA approved  for NASAL specimens only), is one component of a comprehensive MRSA colonization surveillance program. It is not intended to diagnose MRSA infection nor to guide or monitor treatment for MRSA infections. Performed at [facility], [place] [address], [place], [place] [address]      Radiological Exams  on Admission: Ct Abdomen Pelvis Wo Contrast  Result Date: 04/07/2019 CLINICAL DATA:  Nausea for 1 week. History of cholecystectomy 11/02/2018. The patient underwent subsequent placement of a common bile stent which was removed 12/20/2018. EXAM: CT ABDOMEN AND PELVIS WITHOUT CONTRAST TECHNIQUE: Multidetector CT imaging of the abdomen and pelvis was performed following the standard protocol without IV contrast. COMPARISON:  CT abdomen and pelvis 11/11/2018. FINDINGS: Lower chest: No pleural or pericardial effusion. Lung bases clear. Heart size normal. Hepatobiliary: There is diffuse fatty infiltration of the liver. Mild pneumobilia is consistent with prior sphincterotomy and common bile duct stent. There is diffuse fatty infiltration of the liver. A locule of air is seen in the gallbladder fossa measuring 1.8 cm in diameter. There is stranding and loss of soft tissue planes in the gallbladder fossa. Hypoattenuation in the liver parenchyma surrounds the locule of air. Pancreas: Unremarkable. No pancreatic ductal dilatation or surrounding inflammatory changes. Spleen: Normal in size without focal abnormality. Adrenals/Urinary Tract: Small bilateral renal cysts are unchanged. Ureters and urinary bladder appear normal. Tiny left adrenal adenoma is noted. The right adrenal gland appears normal. Stomach/Bowel: Stomach is within normal limits. Appendix appears normal. No evidence of bowel wall thickening, distention, or inflammatory changes. Vascular/Lymphatic: Aortic atherosclerosis. No enlarged abdominal or pelvic lymph nodes. Reproductive: Prostate is unremarkable. Other: Small fat containing right inguinal  hernia noted. Musculoskeletal: Patient has bilateral L5 pars interarticularis defects without anterolisthesis. No acute abnormality. No lytic or sclerotic lesion. IMPRESSION: Locule of air in the gallbladder fossa with surrounding stranding and some hypoattenuation adjacent liver parenchyma is nonspecific but could be due to a small abscess in the gallbladder fossa, pneumobilia within a cystic duct remnant or pyloric/duodenal ulcer. Repeat CT with oral and IV contrast is recommended for further evaluation. Atherosclerosis. Small fat containing right inguinal hernia. Electronically Signed   By: Inge Rise M.D.   On: 04/07/2019 19:21   Dg Chest Port 1 View  Result Date: 04/07/2019 CLINICAL DATA:  Nausea. EXAM: PORTABLE CHEST 1 VIEW COMPARISON:  Chest x-ray dated November 11, 2018. FINDINGS: The heart size and mediastinal contours are within normal limits. Both lungs are clear. The visualized skeletal structures are unremarkable. IMPRESSION: No active disease. Electronically Signed   By: Titus Dubin M.D.   On: 04/07/2019 17:44     EKG: Independently reviewed.  Sinus rhythm, QTC 467, LAD, T wave inversion in V4-V6 which is new.  Assessment/Plan Principal Problem:   Elevated troponin Active Problems:   ESRD (end stage renal disease) on dialysis (HCC)   Type II diabetes mellitus with renal manifestations (HCC)   CAD (coronary artery disease)   Nausea   HLD (hyperlipidemia)   Hypothyroidism   Depression   Elevated troponin and abnormal EKG with T wave inversion and hx of CAD: Patient has a T wave inversion in V4-V6 which is new.  Initial troponin 0.03.  Patient denies any chest pain or shortness of breath.  Troponins are minimally elevated at 0.03, which can be partially explained by the end-stage renal disease, but he has T wave inversions are new.  Patient has nausea and dry heaves of four 1 week, will need to make sure that the patient does not have atypical presentation for heart  attack.  - will place on Tele bed for obs - cycle CE q6 x3 and repeat EKG in the am  - prn Nitroglycerin, Morphine, and aspirin, lipitor  - Risk factor stratification: will check FLP and A1C, UDS  ESRD (end stage renal disease)  on dialysis (TTS):  Pt missed HD today. potassium 4.7, bicarbonate 27, creatinine 8.86, BUN 45. No need for urgent HD -please call renal for HD in AM  Type II diabetes mellitus with renal manifestations ([place]): Last A1c 9.7 on 08/17/18, poorly controled. Patient is taking 2 long-lasting insulin, 70/25, Levemir at home.  Patient had mild hypoglycemia with blood sugar 58 per report. -will decrease Levemir dose from 20 units twice daily to 10 units twice daily, will not give levemir tonight (start in AM) -SSI -prn D50  Nausea: Per previous discharge summary, pt was admitted 08/2018 due to "sepsis and acute cholecystitis rx w/ abx and perc cholecystotomy drain 08/19/18 due to high risk for surgery; dc'd 08/24/18, readmit 11/11/18 for acute cholangitis which was treated w/ IV abx then  laparoscopic cholecystectomy on 11/14/18 per Dr Ninfa Linden. DC's 11/15/18". Then readmitted from 11/22/18- 11/26/2018 due to postoperative bile leak, "underwentERCP, sphincterotomy and plastic stent placement by Dr Therisa Doyne w/ GI. Wire accidentally placed in pancreatic duct during ERCP but no symptoms of post-ERCP pancreatitis". Today, CT showed Locule of air in the gallbladder fossa with surrounding stranding and some hypoattenuation adjacent liver parenchyma. EDP consulted Dr. Brantley Stage of surgeon--> " felt that this area was actually in the liver, is all consistent with his previous gallbladder/liver procedures, does not recommend antibiotics at this time recommends pursuing other causes for his nausea". -will observe. -prn Zofran -f/u Bx which is ordered by EDP  HLD (hyperlipidemia): -lipitor  Hypothyroidism: -Continue Synthroid  COPD: stable. -prn albuterol  Depression: -Continue home  medications: Wellbutrin, Lexapro, Singulair   DVT ppx: SQ Heparin   Code Status: Full code Family Communication:  Yes, patient's wife on the phone Disposition Plan:  Anticipate discharge back to previous home environment Consults called:  none Admission status: Obs / tele     Date of Service 04/08/2019    Ivor Costa [facility]   If 7PM-7AM, please contact night-coverage [email or web address] Password [facility] 04/08/2019, 12:36 AM

## 2019-04-07 NOTE — ED Triage Notes (Signed)
Pt via EMS from a [facility] with c/o Nausea/Dry heaves x 1 week. While at his visit providers noticed changes in his ECG, prompting a call to EMS. Pt was scheduled for dialysis today  but missed today's appoint due to [place] care visit. Pt denied pain and dizziness.

## 2019-04-07 NOTE — Progress Notes (Signed)
Pharmacy Antibiotic Note  Christian Dalton is a 78 y.o. male admitted on 04/07/2019 with nausea. Pharmacy has been consulted for zosyn dosing for intra-abdominal infection. Pt is afebrile and WBC is WNL. Pt with history of ESRD on HD.   Plan: Zosyn 3.37gm IV Q12H (4 hr inf) F/u renal fxn, C&S, clinical status and LOT *Pharmacy will sign off as no dose adjustments are anticipated. Thank you for the consult!  Height: 5\' 10"  (177.8 cm) Weight: 250 lb (113.4 kg) IBW/kg (Calculated) : 73  Temp (24hrs), Avg:98.7 F (37.1 C), Min:98.7 F (37.1 C), Max:98.7 F (37.1 C)  Recent Labs  Lab 04/07/19 1626  WBC 8.9  CREATININE 8.86*    Estimated Creatinine Clearance: 8.9 mL/min (A) (by C-G formula based on SCr of 8.86 mg/dL (H)).    Allergies  Allergen Reactions  . Avelox [Moxifloxacin Hcl] Other (See Comments)    unknown  . Shellfish Allergy Nausea And Vomiting    Stomach  . Sulfa Antibiotics Other (See Comments)    unknown  . Tetracyclines & Related Other (See Comments)    unknown   Gerrell Tabet, Rande Lawman 04/07/2019 7:38 PM

## 2019-04-07 NOTE — ED Notes (Signed)
Attempted to give report X 1 

## 2019-04-08 DIAGNOSIS — R933 Abnormal findings on diagnostic imaging of other parts of digestive tract: Secondary | ICD-10-CM | POA: Diagnosis not present

## 2019-04-08 DIAGNOSIS — Z1159 Encounter for screening for other viral diseases: Secondary | ICD-10-CM | POA: Diagnosis not present

## 2019-04-08 DIAGNOSIS — N186 End stage renal disease: Secondary | ICD-10-CM | POA: Diagnosis not present

## 2019-04-08 DIAGNOSIS — R9431 Abnormal electrocardiogram [ECG] [EKG]: Secondary | ICD-10-CM | POA: Diagnosis not present

## 2019-04-08 DIAGNOSIS — R7989 Other specified abnormal findings of blood chemistry: Secondary | ICD-10-CM | POA: Diagnosis not present

## 2019-04-08 DIAGNOSIS — N2581 Secondary hyperparathyroidism of renal origin: Secondary | ICD-10-CM | POA: Diagnosis not present

## 2019-04-08 DIAGNOSIS — E1122 Type 2 diabetes mellitus with diabetic chronic kidney disease: Secondary | ICD-10-CM | POA: Diagnosis not present

## 2019-04-08 DIAGNOSIS — Z992 Dependence on renal dialysis: Secondary | ICD-10-CM | POA: Diagnosis not present

## 2019-04-08 DIAGNOSIS — R100 Acute abdomen: Secondary | ICD-10-CM | POA: Diagnosis not present

## 2019-04-08 DIAGNOSIS — D631 Anemia in chronic kidney disease: Secondary | ICD-10-CM | POA: Diagnosis not present

## 2019-04-08 DIAGNOSIS — E785 Hyperlipidemia, unspecified: Secondary | ICD-10-CM | POA: Diagnosis not present

## 2019-04-08 DIAGNOSIS — E039 Hypothyroidism, unspecified: Secondary | ICD-10-CM | POA: Diagnosis not present

## 2019-04-08 DIAGNOSIS — I120 Hypertensive chronic kidney disease with stage 5 chronic kidney disease or end stage renal disease: Secondary | ICD-10-CM | POA: Diagnosis not present

## 2019-04-08 DIAGNOSIS — Z794 Long term (current) use of insulin: Secondary | ICD-10-CM | POA: Diagnosis not present

## 2019-04-08 LAB — GLUCOSE, CAPILLARY
Glucose-Capillary: 99 mg/dL (ref 70–99)
Glucose-Capillary: 135 mg/dL — ABNORMAL HIGH (ref 70–99)

## 2019-04-08 LAB — TROPONIN I
Troponin I: 0.03 ng/mL (ref <0.03)
Troponin I: <0.03 ng/mL (ref <0.03)

## 2019-04-08 LAB — LIPID PANEL
Cholesterol: 121 mg/dL (ref 0–200)
HDL: 25 mg/dL — ABNORMAL LOW (ref >40)
LDL Cholesterol: 31 mg/dL (ref 0–99)
Total CHOL/HDL Ratio: 4.8 RATIO
Triglycerides: 325 mg/dL — ABNORMAL HIGH (ref <150)
VLDL: 65 mg/dL — ABNORMAL HIGH (ref 0–40)

## 2019-04-08 LAB — HEMOGLOBIN A1C
Hgb A1c MFr Bld: 6.4 % — ABNORMAL HIGH (ref 4.8–5.6)
Mean Plasma Glucose: 136.98 mg/dL

## 2019-04-08 LAB — MRSA PCR SCREENING: MRSA by PCR: NEGATIVE

## 2019-04-08 NOTE — Discharge Instructions (Signed)
Follow with Maris Berger, MD in 5-7 days  Please get a complete blood count and chemistry panel checked by your Primary MD at your next visit, and again as instructed by your Primary MD. Please get your medications reviewed and adjusted by your Primary MD.  Please request your Primary MD to go over all Hospital Tests and Procedure/Radiological results at the follow up, please get all Hospital records sent to your Prim MD by signing hospital release before you go home.  In some cases, there will be blood work, cultures and biopsy results pending at the time of your discharge. Please request that your primary care M.D. goes through all the records of your hospital data and follows up on these results.  If you had Pneumonia of Lung problems at the Hospital: Please get a 2 view Chest X ray done in 6-8 weeks after hospital discharge or sooner if instructed by your Primary MD.  If you have Congestive Heart Failure: Please call your Cardiologist or Primary MD anytime you have any of the following symptoms:  1) 3 pound weight gain in 24 hours or 5 pounds in 1 week  2) shortness of breath, with or without a dry hacking cough  3) swelling in the hands, feet or stomach  4) if you have to sleep on extra pillows at night in order to breathe  Follow cardiac low salt diet and 1.5 lit/day fluid restriction.  If you have diabetes Accuchecks 4 times/day, Once in AM empty stomach and then before each meal. Log in all results and show them to your primary doctor at your next visit. If any glucose reading is under 80 or above 300 call your primary MD immediately.  If you have Seizure/Convulsions/Epilepsy: Please do not drive, operate heavy machinery, participate in activities at heights or participate in high speed sports until you have seen by Primary MD or a Neurologist and advised to do so again.  If you had Gastrointestinal Bleeding: Please ask your Primary MD to check a complete blood count within  one week of discharge or at your next visit. Your endoscopic/colonoscopic biopsies that are pending at the time of discharge, will also need to followed by your Primary MD.  Get Medicines reviewed and adjusted. Please take all your medications with you for your next visit with your Primary MD  Please request your Primary MD to go over all hospital tests and procedure/radiological results at the follow up, please ask your Primary MD to get all Hospital records sent to his/her office.  If you experience worsening of your admission symptoms, develop shortness of breath, life threatening emergency, suicidal or homicidal thoughts you must seek medical attention immediately by calling 911 or calling your MD immediately  if symptoms less severe.  You must read complete instructions/literature along with all the possible adverse reactions/side effects for all the Medicines you take and that have been prescribed to you. Take any new Medicines after you have completely understood and accpet all the possible adverse reactions/side effects.   Do not drive or operate heavy machinery when taking Pain medications.   Do not take more than prescribed Pain, Sleep and Anxiety Medications  Special Instructions: If you have smoked or chewed Tobacco  in the last 2 yrs please stop smoking, stop any regular Alcohol  and or any Recreational drug use.  Wear Seat belts while driving.  Please note You were cared for by a hospitalist during your hospital stay. If you have any questions about your  discharge medications or the care you received while you were in the hospital after you are discharged, you can call the unit and asked to speak with the hospitalist on call if the hospitalist that took care of you is not available. Once you are discharged, your primary care physician will handle any further medical issues. Please note that NO REFILLS for any discharge medications will be authorized once you are discharged, as it is  imperative that you return to your primary care physician (or establish a relationship with a primary care physician if you do not have one) for your aftercare needs so that they can reassess your need for medications and monitor your lab values.  You can reach the hospitalist office at phone [phone] or fax [phone]   If you do not have a primary care physician, you can call [phone] for a physician referral.  Activity: As tolerated with Full fall precautions use walker/cane & assistance as needed    Diet: heart healthy  Disposition Home

## 2019-04-08 NOTE — Discharge Summary (Signed)
Physician Discharge Summary  Christian Dalton GGY:[id number] DOB: 28-Jun-1941 DOA: 04/07/2019  PCP: Maris Berger, MD  Admit date: 04/07/2019 Discharge date: 04/08/2019  Admitted From: home Disposition:  home  Recommendations for Outpatient Follow-up:  1. Follow up with PCP in 1-2 weeks 2. Continue HD tomorrow as an outpatient  Home Health: None Equipment/Devices: None  Discharge Condition: Stable CODE STATUS: Full code Diet recommendation: Renal  HPI: Per admitting MD, Christian Dalton is a 78 y.o. male with medical history significant of hypertension, hyperlipidemia, diabetes mellitus, COPD, hypothyroidism, depression, atrial fibrillation not on anticoagulants, CAD, ESRD-H D (TTS), OSA not on CPAP, status post cholecystitis with biliary leak, who presents with nausea and dry heaves. Patient states that he has been having intermittent nausea, dry heaves for almost a week.  No vomiting, diarrhea or abdominal pain.  No fever or chills.  Patient denies any chest pain, shortness of breath, cough.  No symptoms of UTI or unilateral weakness. He went to urgent care today near [facility] when he reportedly had EKG changes and EMS was called to brought to ED.He reports that he missed dialysis today due to his urgent care visit. ED Course: pt was found to have troponin 0.03, potassium 4.7, bicarbonate 27, creatinine 8.86, BUN 45, temperature normal, no tachycardia, oxygen saturation 94% on room air.  Chest x-ray negative.  EKG showed new T wave inversion in V4-V6.  Hospital Course: Dry heaves-patient admitted to the hospital after 2-3 days of having dry heaves without vomiting.  Patient tells me that his symptoms have not completely resolved and he has been able to eat and drink regularly for the past couple of days.  He got concerned about this and was evaluated at the urgent care and due to T wave inversions on the EKG was sent to the hospital.  CT scan of the abdomen and pelvis on admission showed some  changes following his cholecystectomy concerning for?  Abscess.  Case was discussed with general surgery, evaluated patient, he is afebrile, no leukocytosis, and they felt like CT scan shows changes consistent with his prior history without acute findings.  Given the fact that his dry heaves have resolved, he is able to tolerate a regular diet, he will be discharged home in stable condition with outpatient follow-up T wave versions on EKG -x1, resolved on repeat EKGs.  Patient without any cardiac symptoms, no chest pain, dyspnea on exertion, any other limiting factors in his activity levels or any anginal symptoms.  His troponin has remained negative.  He was advised to follow-up with PCP as an outpatient in a week for repeat EKG and reevaluation ESRD -patient missed hemodialysis on the day of admission 5/14, nephrology was consulted but he was very adamant that he will not get dialysis as an inpatient but will get dialyzed the day after discharge.  He has no fluid overload, his not hyperkalemic and he will continue dialysis as an outpatient Type 2 diabetes mellitus with renal manifestations-continue home insulin Hyperlipidemia-continue statin Hypothyroidism-continue Synthroid COPD-stable, no wheezing, continue home medications  Discharge Diagnoses:  Principal Problem:   Elevated troponin Active Problems:   ESRD (end stage renal disease) on dialysis (HCC)   Type II diabetes mellitus with renal manifestations (HCC)   CAD (coronary artery disease)   Nausea   HLD (hyperlipidemia)   Hypothyroidism   Depression     Discharge Instructions   Allergies as of 04/08/2019      Reactions   Avelox [moxifloxacin Hcl] Other (See Comments)  Hallucinations   Gabapentin Other (See Comments)   MD has suspended this because he's a dialysis patient   Metoprolol Other (See Comments)   Dropped pulse too low; was taken off of this by MD   Shellfish Allergy Nausea And Vomiting, Other (See Comments)   Made  patient VERY nauseous and he developed stomach cramps   Sulfa Antibiotics Other (See Comments)   Allergy is from childhood (reaction not recalled)   Tetracyclines & Related Other (See Comments)   Blisters on hands and arms   Adhesive [tape] Itching, Rash   Paper tape only- skin cannot tolerate "heavy" tapes      Medication List    TAKE these medications   aspirin EC 81 MG tablet Take 81 mg by mouth at bedtime.   atorvastatin 10 MG tablet Commonly known as:  LIPITOR Take 10 mg by mouth at bedtime.   atorvastatin 20 MG tablet Commonly known as:  LIPITOR Take 1 tablet (20 mg total) by mouth every evening.   buPROPion 150 MG 24 hr tablet Commonly known as:  WELLBUTRIN XL Take 150 mg by mouth at bedtime.   capsaicin 0.025 % cream Commonly known as:  ZOSTRIX Apply 1 application topically 4 (four) times daily as needed (to legs, for pain).   docusate sodium 100 MG capsule Commonly known as:  COLACE Take 100 mg by mouth 2 (two) times daily.   escitalopram 10 MG tablet Commonly known as:  LEXAPRO Take 10 mg by mouth at bedtime.   gabapentin 300 MG capsule Commonly known as:  NEURONTIN Take 300 mg by mouth at bedtime.   HumaLOG Mix 75/25 KwikPen (75-25) 100 UNIT/ML Kwikpen Generic drug:  Insulin Lispro Prot & Lispro Take 10 Units by mouth 2 (two) times daily.   insulin detemir 100 UNIT/ML injection Commonly known as:  LEVEMIR Inject 0.14 mLs (14 Units total) into the skin 2 (two) times daily. What changed:    how much to take  when to take this  additional instructions   lactulose (encephalopathy) 10 GM/15ML Soln Commonly known as:  CHRONULAC Take 15 mLs by mouth daily before supper.   levothyroxine 125 MCG tablet Commonly known as:  SYNTHROID Take 125 mcg by mouth daily before breakfast.   lidocaine-prilocaine cream Commonly known as:  EMLA Apply 1 application topically See admin instructions. To access site (AVF) 1-2 hours before dialysis. Cover with  occlusive dressing (Saran warp)   linaclotide 290 MCG Caps capsule Commonly known as:  LINZESS Take 290 mcg by mouth daily before supper.   loratadine 10 MG tablet Commonly known as:  CLARITIN Take 10 mg every evening by mouth.   midodrine 2.5 MG tablet Commonly known as:  PROAMATINE Take 2.5 mg by mouth 3 (three) times daily.   montelukast 10 MG tablet Commonly known as:  SINGULAIR Take 10 mg every evening by mouth.   oxyCODONE-acetaminophen 10-325 MG tablet Commonly known as:  PERCOCET Take 1 tablet by mouth See admin instructions. Take 1 tablet by mouth one to four times a day as needed for pain   polyethylene glycol powder 17 GM/SCOOP powder Commonly known as:  GLYCOLAX/MIRALAX Take 17 g by mouth daily before supper.   sevelamer carbonate 800 MG tablet Commonly known as:  RENVELA Take 3 tablets (2,400 mg total) by mouth 3 (three) times daily with meals. What changed:  when to take this      Follow-up Information    Maris Berger, MD. Schedule an appointment as soon as possible for  a visit in 1 week(s).   Specialty:  Family Medicine Contact information: [address] [place] [address] [phone]        Flossie Buffy., MD .   Specialty:  Cardiology Contact information: [address] [place] [address] [phone]           Consultations:  Nephrology   Surgery   Procedures/Studies:  Ct Abdomen Pelvis Wo Contrast  Result Date: 04/07/2019 CLINICAL DATA:  Nausea for 1 week. History of cholecystectomy 11/02/2018. The patient underwent subsequent placement of a common bile stent which was removed 12/20/2018. EXAM: CT ABDOMEN AND PELVIS WITHOUT CONTRAST TECHNIQUE: Multidetector CT imaging of the abdomen and pelvis was performed following the standard protocol without IV contrast. COMPARISON:  CT abdomen and pelvis 11/11/2018. FINDINGS: Lower chest: No pleural or pericardial effusion. Lung bases clear. Heart size normal.  Hepatobiliary: There is diffuse fatty infiltration of the liver. Mild pneumobilia is consistent with prior sphincterotomy and common bile duct stent. There is diffuse fatty infiltration of the liver. A locule of air is seen in the gallbladder fossa measuring 1.8 cm in diameter. There is stranding and loss of soft tissue planes in the gallbladder fossa. Hypoattenuation in the liver parenchyma surrounds the locule of air. Pancreas: Unremarkable. No pancreatic ductal dilatation or surrounding inflammatory changes. Spleen: Normal in size without focal abnormality. Adrenals/Urinary Tract: Small bilateral renal cysts are unchanged. Ureters and urinary bladder appear normal. Tiny left adrenal adenoma is noted. The right adrenal gland appears normal. Stomach/Bowel: Stomach is within normal limits. Appendix appears normal. No evidence of bowel wall thickening, distention, or inflammatory changes. Vascular/Lymphatic: Aortic atherosclerosis. No enlarged abdominal or pelvic lymph nodes. Reproductive: Prostate is unremarkable. Other: Small fat containing right inguinal hernia noted. Musculoskeletal: Patient has bilateral L5 pars interarticularis defects without anterolisthesis. No acute abnormality. No lytic or sclerotic lesion. IMPRESSION: Locule of air in the gallbladder fossa with surrounding stranding and some hypoattenuation adjacent liver parenchyma is nonspecific but could be due to a small abscess in the gallbladder fossa, pneumobilia within a cystic duct remnant or pyloric/duodenal ulcer. Repeat CT with oral and IV contrast is recommended for further evaluation. Atherosclerosis. Small fat containing right inguinal hernia. Electronically Signed   By: Inge Rise M.D.   On: 04/07/2019 19:21   Dg Chest Port 1 View  Result Date: 04/07/2019 CLINICAL DATA:  Nausea. EXAM: PORTABLE CHEST 1 VIEW COMPARISON:  Chest x-ray dated November 11, 2018. FINDINGS: The heart size and mediastinal contours are within normal limits.  Both lungs are clear. The visualized skeletal structures are unremarkable. IMPRESSION: No active disease. Electronically Signed   By: Titus Dubin M.D.   On: 04/07/2019 17:44      Subjective: - no chest pain, shortness of breath, no abdominal pain, nausea or vomiting. Wants to go home  Discharge Exam: BP (!) 156/82 (BP Location: Right Arm)    Pulse 83    Temp 98.3 F (36.8 C) (Oral)    Resp 18    Ht 5\' 10"  (1.778 m)    Wt 112.6 kg    SpO2 97%    BMI 35.62 kg/m   General: Pt is alert, awake, not in acute distress Cardiovascular: RRR, S1/S2 +, no rubs, no gallops Respiratory: CTA bilaterally, no wheezing, no rhonchi Abdominal: Soft, NT, ND, bowel sounds + Extremities: no edema, no cyanosis    The results of significant diagnostics from this hospitalization (including imaging, microbiology, ancillary and laboratory) are listed below for reference.  Microbiology: Recent Results (from the past 240 hour(s))  Culture, blood (routine x 2)     Status: None (Preliminary result)   Collection Time: 04/07/19  8:02 PM  Result Value Ref Range Status   Specimen Description BLOOD RIGHT ANTECUBITAL  Final   Special Requests   Final    BOTTLES DRAWN AEROBIC AND ANAEROBIC Blood Culture results may not be optimal due to an excessive volume of blood received in culture bottles   Culture   Final    NO GROWTH < 24 HOURS Performed at [facility] [address] [place], [place] [address]    Report Status PENDING  Incomplete  Culture, blood (routine x 2)     Status: None (Preliminary result)   Collection Time: 04/07/19  8:11 PM  Result Value Ref Range Status   Specimen Description BLOOD RIGHT HAND  Final   Special Requests   Final    BOTTLES DRAWN AEROBIC ONLY Blood Culture results may not be optimal due to an excessive volume of blood received in culture bottles   Culture   Final    NO GROWTH < 24 HOURS Performed at [facility], [place] [address] [place], [place] [address]     Report Status PENDING  Incomplete  SARS Coronavirus 2 (CEPHEID - Performed in [place] hospital lab), Hosp Order     Status: None   Collection Time: 04/07/19  8:48 PM  Result Value Ref Range Status   SARS Coronavirus 2 NEGATIVE NEGATIVE Final    Comment: (NOTE) If result is NEGATIVE SARS-CoV-2 target nucleic acids are NOT DETECTED. The SARS-CoV-2 RNA is generally detectable in upper and lower  respiratory specimens during the acute phase of infection. The lowest  concentration of SARS-CoV-2 viral copies this assay can detect is 250  copies / mL. A negative result does not preclude SARS-CoV-2 infection  and should not be used as the sole basis for treatment or other  patient management decisions.  A negative result may occur with  improper specimen collection / handling, submission of specimen other  than nasopharyngeal swab, presence of viral mutation(s) within the  areas targeted by this assay, and inadequate number of viral copies  (<250 copies / mL). A negative result must be combined with clinical  observations, patient history, and epidemiological information. If result is POSITIVE SARS-CoV-2 target nucleic acids are DETECTED. The SARS-CoV-2 RNA is generally detectable in upper and lower  respiratory specimens dur ing the acute phase of infection.  Positive  results are indicative of active infection with SARS-CoV-2.  Clinical  correlation with patient history and other diagnostic information is  necessary to determine patient infection status.  Positive results do  not rule out bacterial infection or co-infection with other viruses. If result is PRESUMPTIVE POSTIVE SARS-CoV-2 nucleic acids MAY BE PRESENT.   A presumptive positive result was obtained on the submitted specimen  and confirmed on repeat testing.  While 2019 novel coronavirus  (SARS-CoV-2) nucleic acids may be present in the submitted sample  additional confirmatory testing may be necessary for epidemiological    and / or clinical management purposes  to differentiate between  SARS-CoV-2 and other Sarbecovirus currently known to infect humans.  If clinically indicated additional testing with an alternate test  methodology [phone]) is advised. The SARS-CoV-2 RNA is generally  detectable in upper and lower respiratory sp ecimens during the acute  phase of infection. The expected result is Negative. Fact Sheet for Patients:  StrictlyIdeas.no Fact Sheet for  Healthcare Providers: BankingDealers.co.za This test is not yet approved or cleared by the [place] and has been authorized for detection and/or diagnosis of SARS-CoV-2 by FDA under an Emergency Use Authorization (EUA).  This EUA will remain in effect (meaning this test can be used) for the duration of the COVID-19 declaration under Section 564(b)(1) of the Act, 21 U.S.C. section 360bbb-3(b)(1), unless the authorization is terminated or revoked sooner. Performed at [facility], [place] [address] [place], [place] [address]   MRSA PCR Screening     Status: None   Collection Time: 04/07/19 10:31 PM  Result Value Ref Range Status   MRSA by PCR NEGATIVE NEGATIVE Final    Comment:        The GeneXpert MRSA Assay (FDA approved for NASAL specimens only), is one component of a comprehensive MRSA colonization surveillance program. It is not intended to diagnose MRSA infection nor to guide or monitor treatment for MRSA infections. Performed at [facility], [place] [address] [place], [place] [address]      Labs: BNP (last 3 results) No results for input(s): BNP in the last 8760 hours. Basic Metabolic Panel: Recent Labs  Lab 04/07/19 1626  NA 138  K 4.7  CL 96*  CO2 27  GLUCOSE 83  BUN 45*  CREATININE 8.86*  CALCIUM 9.4   Liver Function Tests: Recent Labs  Lab 04/07/19 1626  AST 13*  ALT 24  ALKPHOS 168*  BILITOT 0.7  PROT 6.2*  ALBUMIN 3.2*   Recent Labs   Lab 04/07/19 1626  LIPASE 25   No results for input(s): AMMONIA in the last 168 hours. CBC: Recent Labs  Lab 04/07/19 1626  WBC 8.9  NEUTROABS 6.6  HGB 13.1  HCT 41.3  MCV 94.3  PLT 402*   Cardiac Enzymes: Recent Labs  Lab 04/07/19 1626 04/07/19 2011 04/07/19 2223 04/08/19 0242 04/08/19 0857  TROPONINI 0.03* 0.03* <0.03 0.03* <0.03   BNP: Invalid input(s): POCBNP CBG: Recent Labs  Lab 04/07/19 1958 04/07/19 2240 04/07/19 2346 04/08/19 0612 04/08/19 1131  GLUCAP 102* 68* 151* 99 135*   D-Dimer No results for input(s): DDIMER in the last 72 hours. Hgb A1c Recent Labs    04/08/19 0242  HGBA1C 6.4*   Lipid Profile Recent Labs    04/08/19 0242  CHOL 121  HDL 25*  LDLCALC 31  TRIG 325*  CHOLHDL 4.8   Thyroid function studies No results for input(s): TSH, T4TOTAL, T3FREE, THYROIDAB in the last 72 hours.  Invalid input(s): FREET3 Anemia work up No results for input(s): VITAMINB12, FOLATE, FERRITIN, TIBC, IRON, RETICCTPCT in the last 72 hours. Urinalysis No results found for: COLORURINE, APPEARANCEUR, [place], [place], GLUCOSEU, [place], [place], [place], PROTEINUR, UROBILINOGEN, NITRITE, LEUKOCYTESUR Sepsis Labs Invalid input(s): PROCALCITONIN,  WBC,  LACTICIDVEN  FURTHER DISCHARGE INSTRUCTIONS:   Get Medicines reviewed and adjusted: Please take all your medications with you for your next visit with your Primary MD   Laboratory/radiological data: Please request your Primary MD to go over all hospital tests and procedure/radiological results at the follow up, please ask your Primary MD to get all Hospital records sent to his/her office.   In some cases, they will be blood work, cultures and biopsy results pending at the time of your discharge. Please request that your primary care M.D. goes through all the records of your hospital data and follows up on these results.   Also Note the following: If you experience worsening of your admission  symptoms, develop  shortness of breath, life threatening emergency, suicidal or homicidal thoughts you must seek medical attention immediately by calling 911 or calling your MD immediately  if symptoms less severe.   You must read complete instructions/literature along with all the possible adverse reactions/side effects for all the Medicines you take and that have been prescribed to you. Take any new Medicines after you have completely understood and accpet all the possible adverse reactions/side effects.    Do not drive when taking Pain medications or sleeping medications (Benzodaizepines)   Do not take more than prescribed Pain, Sleep and Anxiety Medications. It is not advisable to combine anxiety,sleep and pain medications without talking with your primary care practitioner   Special Instructions: If you have smoked or chewed Tobacco  in the last 2 yrs please stop smoking, stop any regular Alcohol  and or any Recreational drug use.   Wear Seat belts while driving.   Please note: You were cared for by a hospitalist during your hospital stay. Once you are discharged, your primary care physician will handle any further medical issues. Please note that NO REFILLS for any discharge medications will be authorized once you are discharged, as it is imperative that you return to your primary care physician (or establish a relationship with a primary care physician if you do not have one) for your post hospital discharge needs so that they can reassess your need for medications and monitor your lab values.  Time coordinating discharge: 35 minutes  SIGNED:  Marzetta Board, MD, PhD 04/08/2019, 2:59 PM

## 2019-04-08 NOTE — Consult Note (Addendum)
[facility] Renal Consultation Note    Indication for Consultation:  Management of ESRD/hemodialysis; anemia, hypertension/volume and secondary hyperparathyroidism  HPI: Christian Dalton is a 78 y.o. male with ESRD on HD TTS at [facility]. PMHCAD, DM Type 2, OSA.  Medical history also for signifcant: s/p cholecystomy drain 08/19/18, then laparoscopic cholecystectomy on 11/14/18, then had bile leak requiring ERCP/spinchterectomy and stent placement 11/25/18. Stent removed 1/272/0.   Presents to ED with nausea/dry heaves x 1 week. . Admitted under observation status with abnormal EKG. Abd CT showing Locule of air in the gallbladder fossa with surrounding stranding and some hypoattenuation adjacent liver parenchyma is nonspecific but could be due to a small abscess in the gallbladder fossa, pneumobilia within a cystic duct remnant or pyloric/duodenal ulcer. Surgery consulted.   Missed scheduled HD on Thursday due to illness. Last HD 5/12. Completed 3 hours of 4 hour treatment left at target weight. Frequently cuts HD treatments short.   Seen and examined at bedside. Feels better. Asking about getting something to eat and telling me he may go home today. Denies fevers, chills, CP, SOB, abd pain, or emesis.    Past Medical History:  Diagnosis Date  . Acute cholecystitis 08/17/2018  . Atrial fibrillation ([place])   . CAD (coronary artery disease)   . Cholecystitis   . COPD (chronic obstructive pulmonary disease) ([place])   . Diabetes mellitus without complication ([place])   . ESRD (end stage renal disease) on dialysis ([place])   . Hypertension   . OSA (obstructive sleep apnea)   . Renal disorder   . Spinal stenosis    Past Surgical History:  Procedure Laterality Date  . BILIARY STENT PLACEMENT  11/25/2018   Procedure: BILIARY STENT PLACEMENT;  Surgeon: [name], MD;  Location: [facility] ENDOSCOPY;  Service: Gastroenterology;;  . BIOPSY  12/20/2018   Procedure: BIOPSY;  Surgeon: [name], MD;  Location: [facility] ENDOSCOPY;  Service: Gastroenterology;;  . CHOLECYSTECTOMY N/A 11/14/2018   Procedure: LAPAROSCOPIC CHOLECYSTECTOMY WITH INTRAOPERATIVE CHOLANGIOGRAM;  Surgeon: Coralie Keens, MD;  Location: [place];  Service: General;  Laterality: N/A;  . ERCP N/A 11/25/2018   Procedure: ENDOSCOPIC RETROGRADE CHOLANGIOPANCREATOGRAPHY (ERCP);  Surgeon: [name], MD;  Location: [place];  Service: Gastroenterology;  Laterality: N/A;  . ESOPHAGOGASTRODUODENOSCOPY N/A 12/20/2018   Procedure: ESOPHAGOGASTRODUODENOSCOPY (EGD);  Surgeon: [name], MD;  Location: [facility] ENDOSCOPY;  Service: Gastroenterology;  Laterality: N/A;  . HAND SURGERY Right   . IR DIALY SHUNT INTRO NEEDLE/INTRACATH INITIAL W/IMG LEFT Left 08/20/2018  . IR PERC CHOLECYSTOSTOMY  08/19/2018  . IR RADIOLOGIST EVAL & MGMT  09/29/2018  . SHOULDER SURGERY Right   . SPHINCTEROTOMY  11/25/2018   Procedure: SPHINCTEROTOMY;  Surgeon: [name], MD;  Location: [place];  Service: Gastroenterology;;  . Lavell Islam REMOVAL  12/20/2018   Procedure: STENT REMOVAL;  Surgeon: [name], MD;  Location: [facility] ENDOSCOPY;  Service: Gastroenterology;;  . TIBIA FRACTURE SURGERY     Family History  Problem Relation Age of Onset  . Heart disease Mother   . Heart disease Father   . Heart disease Sister    Social History:  reports that he has never smoked. He has quit using smokeless tobacco. He reports previous alcohol use. He reports that he does not use drugs. Allergies  Allergen Reactions  . Avelox [Moxifloxacin Hcl] Other (See Comments)    Hallucinations   . Gabapentin Other (See Comments)    MD has suspended this because he's a dialysis patient  . Metoprolol Other (  See Comments)    Dropped pulse too low; was taken off of this by MD  . Shellfish Allergy Nausea And Vomiting and Other (See Comments)    Made patient VERY nauseous and he developed stomach cramps  . Sulfa Antibiotics Other (See Comments)    Allergy is from childhood  (reaction not recalled)  . Tetracyclines & Related Other (See Comments)    Blisters on hands and arms  . Adhesive [Tape] Itching and Rash    Paper tape only- skin cannot tolerate "heavy" tapes   Prior to Admission medications   Medication Sig Start Date End Date Taking? Authorizing Provider  aspirin EC 81 MG tablet Take 81 mg by mouth at bedtime.    Yes [provider]  atorvastatin (LIPITOR) 10 MG tablet Take 10 mg by mouth at bedtime.   Yes [provider]  buPROPion (WELLBUTRIN XL) 150 MG 24 hr tablet Take 150 mg by mouth at bedtime.    Yes [provider]  capsaicin (ZOSTRIX) 0.025 % cream Apply 1 application topically 4 (four) times daily as needed (to legs, for pain).  03/29/19  Yes [provider]  docusate sodium (COLACE) 100 MG capsule Take 100 mg by mouth 2 (two) times daily.    Yes [provider]  escitalopram (LEXAPRO) 10 MG tablet Take 10 mg by mouth at bedtime.  08/30/15  Yes [provider]  insulin detemir (LEVEMIR) 100 UNIT/ML injection Inject 0.14 mLs (14 Units total) into the skin 2 (two) times daily. Patient taking differently: Inject 16-20 Units into the skin See admin instructions. Inject 20 units into the skin before breakfast and 20 units before supper, per sliding scale 08/24/18  Yes Elgergawy, Silver Huguenin, MD  Insulin Lispro Prot & Lispro (HUMALOG MIX 75/25 KWIKPEN) (75-25) 100 UNIT/ML Kwikpen Take 10 Units by mouth 2 (two) times daily. 07/12/18  Yes [provider]  lactulose, encephalopathy, (CHRONULAC) 10 GM/15ML SOLN Take 15 mLs by mouth daily before supper.  08/02/18  Yes [provider]  levothyroxine (SYNTHROID, LEVOTHROID) 125 MCG tablet Take 125 mcg by mouth daily before breakfast.    Yes [provider]  lidocaine-prilocaine (EMLA) cream Apply 1 application topically See admin instructions. To access site (AVF) 1-2 hours before dialysis. Cover with occlusive dressing (Saran warp) 08/02/18  Yes  [provider]  linaclotide (LINZESS) 290 MCG CAPS capsule Take 290 mcg by mouth daily before supper.    Yes [provider]  loratadine (CLARITIN) 10 MG tablet Take 10 mg every evening by mouth.   Yes [provider]  montelukast (SINGULAIR) 10 MG tablet Take 10 mg every evening by mouth.   Yes [provider]  oxyCODONE-acetaminophen (PERCOCET) 10-325 MG tablet Take 1 tablet by mouth See admin instructions. Take 1 tablet by mouth one to four times a day as needed for pain 12/09/18  Yes [provider]  polyethylene glycol powder (GLYCOLAX/MIRALAX) 17 GM/SCOOP powder Take 17 g by mouth daily before supper.   Yes [provider]  sevelamer carbonate (RENVELA) 800 MG tablet Take 3 tablets (2,400 mg total) by mouth 3 (three) times daily with meals. Patient taking differently: Take 2,400 mg by mouth 2 (two) times daily with a meal.  08/24/18  Yes Elgergawy, Silver Huguenin, MD  atorvastatin (LIPITOR) 20 MG tablet Take 1 tablet (20 mg total) by mouth every evening. Patient not taking: Reported on 04/07/2019 08/24/18   Elgergawy, Silver Huguenin, MD  gabapentin (NEURONTIN) 300 MG capsule Take 300 mg by  mouth at bedtime.    [provider]  midodrine (PROAMATINE) 2.5 MG tablet Take 2.5 mg by mouth 3 (three) times daily. 09/20/18   [provider]   Current Facility-Administered Medications  Medication Dose Route Frequency Provider Last Rate Last Dose  . acetaminophen (TYLENOL) tablet 650 mg  650 mg Oral Q6H PRN Ivor Costa, MD      . albuterol (PROVENTIL) (2.5 MG/3ML) 0.083% nebulizer solution 2.5 mg  2.5 mg Nebulization Q4H PRN Ivor Costa, MD      . aspirin EC tablet 325 mg  325 mg Oral Daily Ivor Costa, MD   325 mg at 04/08/19 0905  . atorvastatin (LIPITOR) tablet 10 mg  10 mg Oral QHS Ivor Costa, MD   10 mg at 04/07/19 2253  . buPROPion (WELLBUTRIN XL) 24 hr tablet 150 mg  150 mg Oral QHS Ivor Costa, MD      . dextrose 50 % solution 50 mL  50 mL  Intravenous PRN Ivor Costa, MD   25 mL at 04/07/19 2254  . docusate sodium (COLACE) capsule 100 mg  100 mg Oral BID Ivor Costa, MD   100 mg at 04/08/19 0905  . escitalopram (LEXAPRO) tablet 10 mg  10 mg Oral QHS Ivor Costa, MD   10 mg at 04/07/19 2253  . heparin injection 5,000 Units  5,000 Units Subcutaneous Q8H Ivor Costa, MD   5,000 Units at 04/08/19 [phone]  . insulin aspart (novoLOG) injection 0-9 Units  0-9 Units Subcutaneous TID WC Ivor Costa, MD      . insulin detemir (LEVEMIR) injection 10 Units  10 Units Subcutaneous BID Ivor Costa, MD      . lactulose (Babb) 10 GM/15ML solution 10 g  10 g Oral QAC supper Ivor Costa, MD      . levothyroxine (SYNTHROID) tablet 125 mcg  125 mcg Oral QAC breakfast Ivor Costa, MD   125 mcg at 04/08/19 [phone]  . linaclotide (LINZESS) capsule 290 mcg  290 mcg Oral QAC supper Ivor Costa, MD      . loratadine (CLARITIN) tablet 10 mg  10 mg Oral QPM Ivor Costa, MD   10 mg at 04/07/19 2321  . montelukast (SINGULAIR) tablet 10 mg  10 mg Oral QPM Ivor Costa, MD   10 mg at 04/07/19 2321  . morphine 2 MG/ML injection 1 mg  1 mg Intravenous Q4H PRN Ivor Costa, MD      . ondansetron [facility]) injection 4 mg  4 mg Intravenous Q8H PRN Ivor Costa, MD      . oxyCODONE-acetaminophen (PERCOCET/ROXICET) 5-325 MG per tablet 1 tablet  1 tablet Oral Q6H PRN Ivor Costa, MD       And  . oxyCODONE (Oxy IR/ROXICODONE) immediate release tablet 5 mg  5 mg Oral Q6H PRN Ivor Costa, MD      . polyethylene glycol powder (GLYCOLAX/MIRALAX) container 17 g  17 g Oral QAC supper Ivor Costa, MD      . sevelamer carbonate (RENVELA) tablet 2,400 mg  2,400 mg Oral BID WC Ivor Costa, MD         ROS: As per HPI otherwise negative.  Physical Exam: Vitals:   04/07/19 1700 04/07/19 1930 04/07/19 2218 04/08/19 0423  BP: 128/79 128/84 (!) 142/83 (!) 172/81  Pulse: 74 85 76 81  Resp: 16 18 18 18   Temp:   98.5 F (36.9 C) 97.9 F (36.6 C)  TempSrc:   Oral Oral  SpO2: 94% 96% 98% 94%  Weight:  113 kg 112.6 kg  Height:         General: WDWN NAD Head: NCAT sclera not icteric MMM Neck: Supple. No JVD No masses Lungs: CTA bilaterally without wheezes, rales, or rhonchi. Breathing is unlabored. Heart: RRR with S1 S2 Abdomen: soft NT + BS Lower extremities:without edema or ischemic changes, no open wounds  Neuro: A & O  X 3. Moves all extremities spontaneously. Psych:  Responds to questions appropriately with a normal affect. Dialysis Access: LUE AVF +bruit   Labs: Basic Metabolic Panel: Recent Labs  Lab 04/07/19 1626  NA 138  K 4.7  CL 96*  CO2 27  GLUCOSE 83  BUN 45*  CREATININE 8.86*  CALCIUM 9.4   Liver Function Tests: Recent Labs  Lab 04/07/19 1626  AST 13*  ALT 24  ALKPHOS 168*  BILITOT 0.7  PROT 6.2*  ALBUMIN 3.2*   Recent Labs  Lab 04/07/19 1626  LIPASE 25   No results for input(s): AMMONIA in the last 168 hours. CBC: Recent Labs  Lab 04/07/19 1626  WBC 8.9  NEUTROABS 6.6  HGB 13.1  HCT 41.3  MCV 94.3  PLT 402*   Cardiac Enzymes: Recent Labs  Lab 04/07/19 1626 04/07/19 2011 04/07/19 2223 04/08/19 0242 04/08/19 0857  TROPONINI 0.03* 0.03* <0.03 0.03* <0.03   CBG: Recent Labs  Lab 04/07/19 1815 04/07/19 1958 04/07/19 2240 04/07/19 2346 04/08/19 0612  GLUCAP 58* 102* 68* 151* 99   Iron Studies: No results for input(s): IRON, TIBC, TRANSFERRIN, FERRITIN in the last 72 hours. Studies/Results: Ct Abdomen Pelvis Wo Contrast  Result Date: 04/07/2019 CLINICAL DATA:  Nausea for 1 week. History of cholecystectomy 11/02/2018. The patient underwent subsequent placement of a common bile stent which was removed 12/20/2018. EXAM: CT ABDOMEN AND PELVIS WITHOUT CONTRAST TECHNIQUE: Multidetector CT imaging of the abdomen and pelvis was performed following the standard protocol without IV contrast. COMPARISON:  CT abdomen and pelvis 11/11/2018. FINDINGS: Lower chest: No pleural or pericardial effusion. Lung bases clear. Heart size normal.  Hepatobiliary: There is diffuse fatty infiltration of the liver. Mild pneumobilia is consistent with prior sphincterotomy and common bile duct stent. There is diffuse fatty infiltration of the liver. A locule of air is seen in the gallbladder fossa measuring 1.8 cm in diameter. There is stranding and loss of soft tissue planes in the gallbladder fossa. Hypoattenuation in the liver parenchyma surrounds the locule of air. Pancreas: Unremarkable. No pancreatic ductal dilatation or surrounding inflammatory changes. Spleen: Normal in size without focal abnormality. Adrenals/Urinary Tract: Small bilateral renal cysts are unchanged. Ureters and urinary bladder appear normal. Tiny left adrenal adenoma is noted. The right adrenal gland appears normal. Stomach/Bowel: Stomach is within normal limits. Appendix appears normal. No evidence of bowel wall thickening, distention, or inflammatory changes. Vascular/Lymphatic: Aortic atherosclerosis. No enlarged abdominal or pelvic lymph nodes. Reproductive: Prostate is unremarkable. Other: Small fat containing right inguinal hernia noted. Musculoskeletal: Patient has bilateral L5 pars interarticularis defects without anterolisthesis. No acute abnormality. No lytic or sclerotic lesion. IMPRESSION: Locule of air in the gallbladder fossa with surrounding stranding and some hypoattenuation adjacent liver parenchyma is nonspecific but could be due to a small abscess in the gallbladder fossa, pneumobilia within a cystic duct remnant or pyloric/duodenal ulcer. Repeat CT with oral and IV contrast is recommended for further evaluation. Atherosclerosis. Small fat containing right inguinal hernia. Electronically Signed   By: Inge Rise M.D.   On: 04/07/2019 19:21   Dg Chest Port 1 View  Result Date: 04/07/2019  CLINICAL DATA:  Nausea. EXAM: PORTABLE CHEST 1 VIEW COMPARISON:  Chest x-ray dated November 11, 2018. FINDINGS: The heart size and mediastinal contours are within normal limits.  Both lungs are clear. The visualized skeletal structures are unremarkable. IMPRESSION: No active disease. Electronically Signed   By: Titus Dubin M.D.   On: 04/07/2019 17:44    Dialysis Orders:  [facility] 500/800 EDW 114 2K/2.25 Ca No heparin Calcitriol 1.0 TIW   Assessment/Plan: 1. Nausea/dry heaves s/p cholecystectomy/CBD stenting. Surgery consulted for abnormal findings on Abd CT. Felt related to prior procedures. No plans for further intervention.  2. ESRD -  HD TTS. Missed HD Thursday. K 4.7. He does not want to dialyze today. No urgent indications for HD. Ok to resume at outpatient center tomorrow if not staying in hospital.  3. Hypertension/volume  -BP up. Improves with UF. Will follow as OP.  4. Anemia  - No ESA needs  5. Metabolic bone disease -  Continue binders/VDRA   Lynnda Child PA-C [facility] Pager [phone] 04/08/2019, 10:57 AM   Patient seen and examined, agree with above note with above modifications.  Pt left before being seen by me.  Probably would have benefited from HD as missed on Thursday but refused.  Hopefully will go to HD tomorrow as OP  Corliss Parish, MD 04/08/2019

## 2019-04-08 NOTE — Progress Notes (Signed)
Patient discharged to home with wife. IV taken out. Discharge summary reviewed with patient. Belongings sent home with patient.

## 2019-04-08 NOTE — Consult Note (Signed)
[facility] Surgery Consult Note  Christian Dalton 10/23/1941  [id number].    Requesting MD: Dr. Marzetta Board  Chief Complaint/Reason for Consult: Nausea, abnormal CT scan   HPI:  Christian Dalton is a 78 y.o. male with a history of ESRD (T/T/S), DM2, HTN, HLD, obesity, CAD, a-fib (not on any anticoagulants) and prior Lap Cholecystectomy by Dr. Ninfa Linden on 94/80 complicated by a bile leak s/p ERCP and stenting on 1/2 w/ removal on stent on 1/27 who was admitted for abnormal EKG w/ elevated Tn and abnormal CT scan findings.   The patient reports over the last 1 week he has had intermittent episodes of nausea and dry heaves. He reports these episodes are not related to eating and are not brought on by anything in particular. Nothing makes his symptoms better or worse. He has not tried anything. His symptoms resolve without intervention. He denies any associated abdominal pain or actual emesis with this. No diarrhea. His last BM was 2 days ago (normally goes every 2 days). He reports that he went to a Urgent care in [place] yesterday ("because my wife made me") and was found to have some EKG changes so he was sent to the ED. In the ED the patient underwent a CT scan of the abdomen that showed a locule of air in the gallbladder fossa w/ surrounding stranding and some hypoattenuation adjacent liver parenchyma that could be a small abscess in the gallbladder fossa, pneumobilia w/in a cyst duct remnant or pyloric/duodenal ulcer. The patient denies hx of Ulcers. No NSAID use or alcohol use. 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 He has had prior H. Pylori testing in [place] that were negative. General surgery was consulted in regards to the patient's CT scan findings. Currently the patient has some nausea but denies any further dry heaves. No fever, chills, abdominal pain.   In the ED was also found to have to have troponin  0.03, potassium 4.7, bicarbonate 27, creatinine 8.86, BUN 45, temperature normal, no tachycardia, oxygen saturation 94% on room air.  Chest x-ray negative.  EKG showed new T wave inversion in V4-V6. He did miss his Thursday dialysis 2/2 going to urgent care and being admitted. The patient was admitted for serial Tn which have been flat at 0.03. General surgery was consulted in regards to the patient's CT scan findings.   ROS: Review of Systems  Constitutional: Positive for diaphoresis (after episodes of dry heaving and nausea). Negative for chills and fever.  Respiratory: Negative for cough and shortness of breath.   Cardiovascular: Negative for chest pain.  Gastrointestinal: Positive for nausea. Negative for abdominal pain, blood in stool, constipation, diarrhea, melena and vomiting.  Genitourinary: Negative for dysuria.  All other systems reviewed and are negative.  All systems reviewed and otherwise negative except for as above  Family History  Problem Relation Age of Onset  . Heart disease Mother   . Heart disease Father   . Heart disease Sister     Past Medical History:  Diagnosis Date  . Acute cholecystitis 08/17/2018  . Atrial fibrillation ([place])   . CAD (coronary artery disease)   . Cholecystitis   . COPD (chronic obstructive pulmonary disease) ([place])   . Diabetes mellitus without complication ([place])   . ESRD (end stage renal disease) on dialysis ([place])   . Hypertension   . OSA (obstructive sleep apnea)   . Renal disorder   .  Spinal stenosis     Past Surgical History:  Procedure Laterality Date  . BILIARY STENT PLACEMENT  11/25/2018   Procedure: BILIARY STENT PLACEMENT;  Surgeon: [name], MD;  Location: [facility] ENDOSCOPY;  Service: Gastroenterology;;  . BIOPSY  12/20/2018   Procedure: BIOPSY;  Surgeon: [name], MD;  Location: [facility] ENDOSCOPY;  Service: Gastroenterology;;  . CHOLECYSTECTOMY N/A 11/14/2018   Procedure: LAPAROSCOPIC CHOLECYSTECTOMY WITH INTRAOPERATIVE  CHOLANGIOGRAM;  Surgeon: Coralie Keens, MD;  Location: [place];  Service: General;  Laterality: N/A;  . ERCP N/A 11/25/2018   Procedure: ENDOSCOPIC RETROGRADE CHOLANGIOPANCREATOGRAPHY (ERCP);  Surgeon: [name], MD;  Location: [place];  Service: Gastroenterology;  Laterality: N/A;  . ESOPHAGOGASTRODUODENOSCOPY N/A 12/20/2018   Procedure: ESOPHAGOGASTRODUODENOSCOPY (EGD);  Surgeon: [name], MD;  Location: [facility] ENDOSCOPY;  Service: Gastroenterology;  Laterality: N/A;  . HAND SURGERY Right   . IR DIALY SHUNT INTRO NEEDLE/INTRACATH INITIAL W/IMG LEFT Left 08/20/2018  . IR PERC CHOLECYSTOSTOMY  08/19/2018  . IR RADIOLOGIST EVAL & MGMT  09/29/2018  . SHOULDER SURGERY Right   . SPHINCTEROTOMY  11/25/2018   Procedure: SPHINCTEROTOMY;  Surgeon: [name], MD;  Location: [facility] ENDOSCOPY;  Service: Gastroenterology;;  . Lavell Islam REMOVAL  12/20/2018   Procedure: STENT REMOVAL;  Surgeon: [name], MD;  Location: [facility] ENDOSCOPY;  Service: Gastroenterology;;  . TIBIA FRACTURE SURGERY      Social History:  reports that he has never smoked. He has quit using smokeless tobacco. He reports previous alcohol use. He reports that he does not use drugs.  Allergies:  Allergies  Allergen Reactions  . Avelox [Moxifloxacin Hcl] Other (See Comments)    Hallucinations   . Gabapentin Other (See Comments)    MD has suspended this because he's a dialysis patient  . Metoprolol Other (See Comments)    Dropped pulse too low; was taken off of this by MD  . Shellfish Allergy Nausea And Vomiting and Other (See Comments)    Made patient VERY nauseous and he developed stomach cramps  . Sulfa Antibiotics Other (See Comments)    Allergy is from childhood (reaction not recalled)  . Tetracyclines & Related Other (See Comments)    Blisters on hands and arms  . Adhesive [Tape] Itching and Rash    Paper tape only- skin cannot tolerate "heavy" tapes    Medications Prior to Admission  Medication Sig Dispense Refill  .  aspirin EC 81 MG tablet Take 81 mg by mouth at bedtime.     Marland Kitchen atorvastatin (LIPITOR) 10 MG tablet Take 10 mg by mouth at bedtime.    Marland Kitchen buPROPion (WELLBUTRIN XL) 150 MG 24 hr tablet Take 150 mg by mouth at bedtime.     . capsaicin (ZOSTRIX) 0.025 % cream Apply 1 application topically 4 (four) times daily as needed (to legs, for pain).     Marland Kitchen docusate sodium (COLACE) 100 MG capsule Take 100 mg by mouth 2 (two) times daily.     Marland Kitchen escitalopram (LEXAPRO) 10 MG tablet Take 10 mg by mouth at bedtime.     . insulin detemir (LEVEMIR) 100 UNIT/ML injection Inject 0.14 mLs (14 Units total) into the skin 2 (two) times daily. (Patient taking differently: Inject 16-20 Units into the skin See admin instructions. Inject 20 units into the skin before breakfast and 20 units before supper, per sliding scale) 10 mL 11  . Insulin Lispro Prot & Lispro (HUMALOG MIX 75/25 KWIKPEN) (75-25) 100 UNIT/ML Kwikpen Take 10 Units by mouth 2 (two) times daily.    Marland Kitchen  lactulose, encephalopathy, (CHRONULAC) 10 GM/15ML SOLN Take 15 mLs by mouth daily before supper.   4  . levothyroxine (SYNTHROID, LEVOTHROID) 125 MCG tablet Take 125 mcg by mouth daily before breakfast.     . lidocaine-prilocaine (EMLA) cream Apply 1 application topically See admin instructions. To access site (AVF) 1-2 hours before dialysis. Cover with occlusive dressing (Saran warp)  4  . linaclotide (LINZESS) 290 MCG CAPS capsule Take 290 mcg by mouth daily before supper.     . loratadine (CLARITIN) 10 MG tablet Take 10 mg every evening by mouth.    . montelukast (SINGULAIR) 10 MG tablet Take 10 mg every evening by mouth.    . oxyCODONE-acetaminophen (PERCOCET) 10-325 MG tablet Take 1 tablet by mouth See admin instructions. Take 1 tablet by mouth one to four times a day as needed for pain    . polyethylene glycol powder (GLYCOLAX/MIRALAX) 17 GM/SCOOP powder Take 17 g by mouth daily before supper.    . sevelamer carbonate (RENVELA) 800 MG tablet Take 3 tablets (2,400 mg  total) by mouth 3 (three) times daily with meals. (Patient taking differently: Take 2,400 mg by mouth 2 (two) times daily with a meal. ) 45 tablet 0  . atorvastatin (LIPITOR) 20 MG tablet Take 1 tablet (20 mg total) by mouth every evening. (Patient not taking: Reported on 04/07/2019)    . gabapentin (NEURONTIN) 300 MG capsule Take 300 mg by mouth at bedtime.    . midodrine (PROAMATINE) 2.5 MG tablet Take 2.5 mg by mouth 3 (three) times daily.      Prior to Admission medications   Medication Sig Start Date End Date Taking? Authorizing Provider  aspirin EC 81 MG tablet Take 81 mg by mouth at bedtime.    Yes [provider]  atorvastatin (LIPITOR) 10 MG tablet Take 10 mg by mouth at bedtime.   Yes [provider]  buPROPion (WELLBUTRIN XL) 150 MG 24 hr tablet Take 150 mg by mouth at bedtime.    Yes [provider]  capsaicin (ZOSTRIX) 0.025 % cream Apply 1 application topically 4 (four) times daily as needed (to legs, for pain).  03/29/19  Yes [provider]  docusate sodium (COLACE) 100 MG capsule Take 100 mg by mouth 2 (two) times daily.    Yes [provider]  escitalopram (LEXAPRO) 10 MG tablet Take 10 mg by mouth at bedtime.  08/30/15  Yes [provider]  insulin detemir (LEVEMIR) 100 UNIT/ML injection Inject 0.14 mLs (14 Units total) into the skin 2 (two) times daily. Patient taking differently: Inject 16-20 Units into the skin See admin instructions. Inject 20 units into the skin before breakfast and 20 units before supper, per sliding scale 08/24/18  Yes Elgergawy, Silver Huguenin, MD  Insulin Lispro Prot & Lispro (HUMALOG MIX 75/25 KWIKPEN) (75-25) 100 UNIT/ML Kwikpen Take 10 Units by mouth 2 (two) times daily. 07/12/18  Yes [provider]  lactulose, encephalopathy, (CHRONULAC) 10 GM/15ML SOLN Take 15 mLs by mouth daily before supper.  08/02/18  Yes [provider]  levothyroxine (SYNTHROID, LEVOTHROID) 125 MCG tablet Take 125 mcg  by mouth daily before breakfast.    Yes [provider]  lidocaine-prilocaine (EMLA) cream Apply 1 application topically See admin instructions. To access site (AVF) 1-2 hours before dialysis. Cover with occlusive dressing (Saran warp) 08/02/18  Yes [provider]  linaclotide (LINZESS) 290 MCG CAPS capsule Take 290 mcg by mouth daily before supper.    Yes [provider]  loratadine (CLARITIN) 10 MG tablet Take 10 mg every evening by mouth.   Yes [provider]  montelukast (SINGULAIR) 10 MG tablet Take 10 mg every evening by mouth.   Yes [provider]  oxyCODONE-acetaminophen (PERCOCET) 10-325 MG tablet Take 1 tablet by mouth See admin instructions. Take 1 tablet by mouth one to four times a day as needed for pain 12/09/18  Yes [provider]  polyethylene glycol powder (GLYCOLAX/MIRALAX) 17 GM/SCOOP powder Take 17 g by mouth daily before supper.   Yes [provider]  sevelamer carbonate (RENVELA) 800 MG tablet Take 3 tablets (2,400 mg total) by mouth 3 (three) times daily with meals. Patient taking differently: Take 2,400 mg by mouth 2 (two) times daily with a meal.  08/24/18  Yes Elgergawy, Silver Huguenin, MD  atorvastatin (LIPITOR) 20 MG tablet Take 1 tablet (20 mg total) by mouth every evening. Patient not taking: Reported on 04/07/2019 08/24/18   Elgergawy, Silver Huguenin, MD  gabapentin (NEURONTIN) 300 MG capsule Take 300 mg by mouth at bedtime.    [provider]  midodrine (PROAMATINE) 2.5 MG tablet Take 2.5 mg by mouth 3 (three) times daily. 09/20/18   [provider]    Blood pressure (!) 172/81, pulse 81, temperature 97.9 F (36.6 C), temperature source Oral, resp. rate 18, height 5\' 10"  (1.778 m), weight 112.6 kg, SpO2 94 %. Physical Exam: General: pleasant, WD/WN white male who is laying in bed in NAD HEENT: head is normocephalic, atraumatic.  Sclera are noninjected.  Pupils equal and round.  Ears and nose without  any masses or lesions.  Mouth is pink and moist. Dentition fair Heart: regular, rate, and rhythm.  No obvious murmurs, gallops, or rubs noted.  Palpable pedal pulses bilaterally Lungs: CTAB, no wheezes, rhonchi, or rales noted.  Respiratory effort nonlabored Abd: obese, soft, NT/ND, +BS, no masses, hernias, or organomegaly MS: all 4 extremities are symmetrical with no cyanosis, clubbing, or edema. Skin: warm and dry with no masses, lesions, or rashes Psych: A&Ox3 with an appropriate affect. Neuro: cranial nerves grossly intact, extremity CSM intact bilaterally, normal speech  Results for orders placed or performed during the hospital encounter of 04/07/19 (from the past 48 hour(s))  Comprehensive metabolic panel     Status: Abnormal   Collection Time: 04/07/19  4:26 PM  Result Value Ref Range   Sodium 138 135 - 145 mmol/L   Potassium 4.7 3.5 - 5.1 mmol/L   Chloride 96 (L) 98 - 111 mmol/L   CO2 27 22 - 32 mmol/L   Glucose, Bld 83 70 - 99 mg/dL   BUN 45 (H) 8 - 23 mg/dL   Creatinine, Ser 8.86 (H) 0.61 - 1.24 mg/dL   Calcium 9.4 8.9 - 10.3 mg/dL   Total Protein 6.2 (L) 6.5 - 8.1 g/dL   Albumin 3.2 (L) 3.5 - 5.0 g/dL   AST 13 (L) 15 - 41 U/L   ALT 24 0 - 44 U/L   Alkaline Phosphatase 168 (H) 38 - 126 U/L   Total Bilirubin 0.7 0.3 - 1.2 mg/dL   GFR calc non Af Amer 5 (L) >60 mL/min   GFR calc Af Amer 6 (L) >60 mL/min   Anion gap 15 5 - 15    Comment: Performed at [facility], [address] [place], [place] [address]  Lipase, blood     Status: None   Collection Time: 04/07/19  4:26 PM  Result Value Ref Range   Lipase 25  11 - 51 U/L    Comment: Performed at [facility], [place] [address] [place], [place] [address]  CBC with Differential     Status: Abnormal   Collection Time: 04/07/19  4:26 PM  Result Value Ref Range   WBC 8.9 4.0 - 10.5 K/uL   RBC 4.38 4.22 - 5.81 MIL/uL   Hemoglobin 13.1 13.0 - 17.0 g/dL   HCT 41.3 39.0 - 52.0 %   MCV 94.3 80.0 - 100.0 fL   MCH  29.9 26.0 - 34.0 pg   MCHC 31.7 30.0 - 36.0 g/dL   RDW 14.8 11.5 - 15.5 %   Platelets 402 (H) 150 - 400 K/uL   nRBC 0.0 0.0 - 0.2 %   Neutrophils Relative % 74 %   Neutro Abs 6.6 1.7 - 7.7 K/uL   Lymphocytes Relative 17 %   Lymphs Abs 1.5 0.7 - 4.0 K/uL   Monocytes Relative 6 %   Monocytes Absolute 0.6 0.1 - 1.0 K/uL   Eosinophils Relative 1 %   Eosinophils Absolute 0.1 0.0 - 0.5 K/uL   Basophils Relative 1 %   Basophils Absolute 0.1 0.0 - 0.1 K/uL   Immature Granulocytes 1 %   Abs Immature Granulocytes 0.06 0.00 - 0.07 K/uL    Comment: Performed at [facility] [address], [place], [place] [address]  Troponin I - Once     Status: Abnormal   Collection Time: 04/07/19  4:26 PM  Result Value Ref Range   Troponin I 0.03 (HH) <0.03 ng/mL    Comment: CRITICAL RESULT CALLED TO, READ BACK BY AND VERIFIED WITH: S CRUZ,RN 1718 04/07/2019 D BRADLEY Performed at [facility], [place] [address] [place], [place] [address]   CBG monitoring, ED     Status: Abnormal   Collection Time: 04/07/19  6:15 PM  Result Value Ref Range   Glucose-Capillary 58 (L) 70 - 99 mg/dL   Comment 1 Notify RN    Comment 2 Document in Chart   CBG monitoring, ED     Status: Abnormal   Collection Time: 04/07/19  7:58 PM  Result Value Ref Range   Glucose-Capillary 102 (H) 70 - 99 mg/dL  Culture, blood (routine x 2)     Status: None (Preliminary result)   Collection Time: 04/07/19  8:02 PM  Result Value Ref Range   Specimen Description BLOOD RIGHT ANTECUBITAL    Special Requests      BOTTLES DRAWN AEROBIC AND ANAEROBIC Blood Culture results may not be optimal due to an excessive volume of blood received in culture bottles   Culture      NO GROWTH < 24 HOURS Performed at [facility] [address] [place], [place] [address]    Report Status PENDING   Troponin I - Once     Status: Abnormal   Collection Time: 04/07/19  8:11 PM  Result Value Ref Range   Troponin I 0.03 (HH) <0.03 ng/mL     Comment: CRITICAL VALUE NOTED.  VALUE IS CONSISTENT WITH PREVIOUSLY REPORTED AND CALLED VALUE. Performed at [facility], [place] [address], [place], [place] [address]   Culture, blood (routine x 2)     Status: None (Preliminary result)   Collection Time: 04/07/19  8:11 PM  Result Value Ref Range   Specimen Description BLOOD RIGHT HAND    Special Requests      BOTTLES DRAWN AEROBIC ONLY Blood Culture results may not be optimal due  to an excessive volume of blood received in culture bottles   Culture      NO GROWTH < 24 HOURS Performed at [facility] [address], [place], [place] [address]    Report Status PENDING   Lactic acid, plasma     Status: None   Collection Time: 04/07/19  8:11 PM  Result Value Ref Range   Lactic Acid, Venous 1.0 0.5 - 1.9 mmol/L    Comment: Performed at [facility] [address] [place], [place] [address]  SARS Coronavirus 2 (CEPHEID - Performed in [place] hospital lab), Hosp Order     Status: None   Collection Time: 04/07/19  8:48 PM  Result Value Ref Range   SARS Coronavirus 2 NEGATIVE NEGATIVE    Comment: (NOTE) If result is NEGATIVE SARS-CoV-2 target nucleic acids are NOT DETECTED. The SARS-CoV-2 RNA is generally detectable in upper and lower  respiratory specimens during the acute phase of infection. The lowest  concentration of SARS-CoV-2 viral copies this assay can detect is 250  copies / mL. A negative result does not preclude SARS-CoV-2 infection  and should not be used as the sole basis for treatment or other  patient management decisions.  A negative result may occur with  improper specimen collection / handling, submission of specimen other  than nasopharyngeal swab, presence of viral mutation(s) within the  areas targeted by this assay, and inadequate number of viral copies  (<250 copies / mL). A negative result must be combined with clinical  observations, patient history, and epidemiological information. If  result is POSITIVE SARS-CoV-2 target nucleic acids are DETECTED. The SARS-CoV-2 RNA is generally detectable in upper and lower  respiratory specimens dur ing the acute phase of infection.  Positive  results are indicative of active infection with SARS-CoV-2.  Clinical  correlation with patient history and other diagnostic information is  necessary to determine patient infection status.  Positive results do  not rule out bacterial infection or co-infection with other viruses. If result is PRESUMPTIVE POSTIVE SARS-CoV-2 nucleic acids MAY BE PRESENT.   A presumptive positive result was obtained on the submitted specimen  and confirmed on repeat testing.  While 2019 novel coronavirus  (SARS-CoV-2) nucleic acids may be present in the submitted sample  additional confirmatory testing may be necessary for epidemiological  and / or clinical management purposes  to differentiate between  SARS-CoV-2 and other Sarbecovirus currently known to infect humans.  If clinically indicated additional testing with an alternate test  methodology [phone]) is advised. The SARS-CoV-2 RNA is generally  detectable in upper and lower respiratory sp ecimens during the acute  phase of infection. The expected result is Negative. Fact Sheet for Patients:  StrictlyIdeas.no Fact Sheet for Healthcare Providers: BankingDealers.co.za This test is not yet approved or cleared by the [place] FDA and has been authorized for detection and/or diagnosis of SARS-CoV-2 by FDA under an Emergency Use Authorization (EUA).  This EUA will remain in effect (meaning this test can be used) for the duration of the COVID-19 declaration under Section 564(b)(1) of the Act, 21 U.S.C. section 360bbb-3(b)(1), unless the authorization is terminated or revoked sooner. Performed at [facility], [place] [address], [place], [place] [address]   Lactic acid, plasma     Status: None    Collection Time: 04/07/19 10:23 PM  Result Value Ref Range   Lactic Acid, Venous 0.9 0.5 - 1.9 mmol/L    Comment: Performed at [facility], [address] [place], [place] [address]  Troponin I - Now Then Q6H     Status: None   Collection Time: 04/07/19 10:23 PM  Result Value Ref Range   Troponin I <0.03 <0.03 ng/mL    Comment: Performed at [facility] [address], [place], [place] [address]  MRSA PCR Screening     Status: None   Collection Time: 04/07/19 10:31 PM  Result Value Ref Range   MRSA by PCR NEGATIVE NEGATIVE    Comment:        The GeneXpert MRSA Assay (FDA approved for NASAL specimens only), is one component of a comprehensive MRSA colonization surveillance program. It is not intended to diagnose MRSA infection nor to guide or monitor treatment for MRSA infections. Performed at [facility], [place] [address] [place], [place] [address]   Glucose, capillary     Status: Abnormal   Collection Time: 04/07/19 10:40 PM  Result Value Ref Range   Glucose-Capillary 68 (L) 70 - 99 mg/dL  Glucose, capillary     Status: Abnormal   Collection Time: 04/07/19 11:46 PM  Result Value Ref Range   Glucose-Capillary 151 (H) 70 - 99 mg/dL  Hemoglobin A1c     Status: Abnormal   Collection Time: 04/08/19  2:42 AM  Result Value Ref Range   Hgb A1c MFr Bld 6.4 (H) 4.8 - 5.6 %    Comment: (NOTE) Pre diabetes:          5.7%-6.4% Diabetes:              >6.4% Glycemic control for   <7.0% adults with diabetes    Mean Plasma Glucose 136.98 mg/dL    Comment: Performed at [facility], [place] [address], [place], [place] [address]  Lipid panel     Status: Abnormal   Collection Time: 04/08/19  2:42 AM  Result Value Ref Range   Cholesterol 121 0 - 200 mg/dL   Triglycerides 325 (H) <150 mg/dL   HDL 25 (L) >40 mg/dL   Total CHOL/HDL Ratio 4.8 RATIO   VLDL 65 (H) 0 - 40 mg/dL   LDL Cholesterol 31 0 - 99 mg/dL    Comment:        Total Cholesterol/HDL:CHD  Risk Coronary Heart Disease Risk Table                     Men   Women  1/2 Average Risk   3.4   3.3  Average Risk       5.0   4.4  2 X Average Risk   9.6   7.1  3 X Average Risk  23.4   11.0        Use the calculated Patient Ratio above and the CHD Risk Table to determine the patient's CHD Risk.        ATP III CLASSIFICATION (LDL):  <100     mg/dL   Optimal  100-129  mg/dL   Near or Above                    Optimal  130-159  mg/dL   Borderline  160-189  mg/dL   High  >190     mg/dL   Very High Performed at [facility] [address], [place],  [address]   Troponin I - Now Then Q6H     Status: Abnormal   Collection Time: 04/08/19  2:42 AM  Result Value Ref Range   Troponin I 0.03 (HH) <0.03 ng/mL    Comment: CRITICAL RESULT CALLED TO, READ BACK BY AND VERIFIED WITH: Eugenie Filler [id number] [facility] Performed at [facility], [place] [address] [place], [place] [address]   Glucose, capillary     Status: None   Collection Time: 04/08/19  6:12 AM  Result Value Ref Range   Glucose-Capillary 99 70 - 99 mg/dL  Troponin I - Now Then Q6H     Status: None   Collection Time: 04/08/19  8:57 AM  Result Value Ref Range   Troponin I <0.03 <0.03 ng/mL    Comment: Performed at [facility] [address] [place], [place] [address]   Ct Abdomen Pelvis Wo Contrast  Result Date: 04/07/2019 CLINICAL DATA:  Nausea for 1 week. History of cholecystectomy 11/02/2018. The patient underwent subsequent placement of a common bile stent which was removed 12/20/2018. EXAM: CT ABDOMEN AND PELVIS WITHOUT CONTRAST TECHNIQUE: Multidetector CT imaging of the abdomen and pelvis was performed following the standard protocol without IV contrast. COMPARISON:  CT abdomen and pelvis 11/11/2018. FINDINGS: Lower chest: No pleural or pericardial effusion. Lung bases clear. Heart size normal. Hepatobiliary: There is diffuse fatty infiltration of the liver. Mild pneumobilia is consistent with  prior sphincterotomy and common bile duct stent. There is diffuse fatty infiltration of the liver. A locule of air is seen in the gallbladder fossa measuring 1.8 cm in diameter. There is stranding and loss of soft tissue planes in the gallbladder fossa. Hypoattenuation in the liver parenchyma surrounds the locule of air. Pancreas: Unremarkable. No pancreatic ductal dilatation or surrounding inflammatory changes. Spleen: Normal in size without focal abnormality. Adrenals/Urinary Tract: Small bilateral renal cysts are unchanged. Ureters and urinary bladder appear normal. Tiny left adrenal adenoma is noted. The right adrenal gland appears normal. Stomach/Bowel: Stomach is within normal limits. Appendix appears normal. No evidence of bowel wall thickening, distention, or inflammatory changes. Vascular/Lymphatic: Aortic atherosclerosis. No enlarged abdominal or pelvic lymph nodes. Reproductive: Prostate is unremarkable. Other: Small fat containing right inguinal hernia noted. Musculoskeletal: Patient has bilateral L5 pars interarticularis defects without anterolisthesis. No acute abnormality. No lytic or sclerotic lesion. IMPRESSION: Locule of air in the gallbladder fossa with surrounding stranding and some hypoattenuation adjacent liver parenchyma is nonspecific but could be due to a small abscess in the gallbladder fossa, pneumobilia within a cystic duct remnant or pyloric/duodenal ulcer. Repeat CT with oral and IV contrast is recommended for further evaluation. Atherosclerosis. Small fat containing right inguinal hernia. Electronically Signed   By: Inge Rise M.D.   On: 04/07/2019 19:21   Dg Chest Port 1 View  Result Date: 04/07/2019 CLINICAL DATA:  Nausea. EXAM: PORTABLE CHEST 1 VIEW COMPARISON:  Chest x-ray dated November 11, 2018. FINDINGS: The heart size and mediastinal contours are within normal limits. Both lungs are clear. The visualized skeletal structures are unremarkable. IMPRESSION: No active  disease. Electronically Signed   By: Titus Dubin M.D.   On: 04/07/2019 17:44      Assessment/Plan ESRD (T/T/S) DM2 HTN HLD CAD A-fib (not on any anticoagulants)  Abnormal EKG and elevated Tn   Abnormal CT scan in setting of nausea and dry heaving - S/p Per Chole drain by IR on 9/26 for admission on 9/24 - 10/1 for sepsis 2/2 Acute Cholecystitis - S/p Lap Cholecystectomy by Dr. Ninfa Linden  - S/p ERCP w/ CBD stenting on 1/2 for post-operative bile leak - S/p UE for stent removal on  1/27. Also underwent bx of gastric body that was negative for H. Pylori - CT on 5/14 Locule of air in the gallbladder fossa with surrounding stranding & some hypoattenuation adjacent liver parenchyma is nonspecific but could be due to a small abscess in the gallbladder fossa, pneumobilia within a cystic duct remnant or pyloric/duodenal ulcer - Reports hx of 1 week of nausea and dry heaving. No fever, abdominal pain or emesis. Tolerating diet at home and symptoms are not related to eating. Abdominal exam is benign. CT findings was likely from prior procedures. No further interventions/imaging recommended. No indication for emergent or urgent surgery. Will sign off.   ID - None VTE - SCD, Heparin FEN - Currently NPO  Jillyn Ledger, [facility] 04/08/2019, 10:07 AM Pager: [phone]

## 2019-04-09 DIAGNOSIS — N186 End stage renal disease: Secondary | ICD-10-CM | POA: Diagnosis not present

## 2019-04-09 DIAGNOSIS — N2581 Secondary hyperparathyroidism of renal origin: Secondary | ICD-10-CM | POA: Diagnosis not present

## 2019-04-09 DIAGNOSIS — E1121 Type 2 diabetes mellitus with diabetic nephropathy: Secondary | ICD-10-CM | POA: Diagnosis not present

## 2019-04-12 LAB — CULTURE, BLOOD (ROUTINE X 2)
Culture: NO GROWTH
Culture: NO GROWTH

## 2019-04-14 DIAGNOSIS — N2581 Secondary hyperparathyroidism of renal origin: Secondary | ICD-10-CM | POA: Diagnosis not present

## 2019-04-14 DIAGNOSIS — E1121 Type 2 diabetes mellitus with diabetic nephropathy: Secondary | ICD-10-CM | POA: Diagnosis not present

## 2019-04-14 DIAGNOSIS — N186 End stage renal disease: Secondary | ICD-10-CM | POA: Diagnosis not present

## 2019-04-15 DIAGNOSIS — M47816 Spondylosis without myelopathy or radiculopathy, lumbar region: Secondary | ICD-10-CM | POA: Diagnosis not present

## 2019-04-15 DIAGNOSIS — K838 Other specified diseases of biliary tract: Secondary | ICD-10-CM | POA: Diagnosis not present

## 2019-04-15 DIAGNOSIS — R1110 Vomiting, unspecified: Secondary | ICD-10-CM | POA: Diagnosis not present

## 2019-04-15 DIAGNOSIS — M5126 Other intervertebral disc displacement, lumbar region: Secondary | ICD-10-CM | POA: Diagnosis not present

## 2019-04-15 DIAGNOSIS — E1151 Type 2 diabetes mellitus with diabetic peripheral angiopathy without gangrene: Secondary | ICD-10-CM | POA: Diagnosis not present

## 2019-04-15 DIAGNOSIS — Z992 Dependence on renal dialysis: Secondary | ICD-10-CM | POA: Diagnosis not present

## 2019-04-15 DIAGNOSIS — I517 Cardiomegaly: Secondary | ICD-10-CM | POA: Diagnosis not present

## 2019-04-15 DIAGNOSIS — R9431 Abnormal electrocardiogram [ECG] [EKG]: Secondary | ICD-10-CM | POA: Diagnosis not present

## 2019-04-15 DIAGNOSIS — K9189 Other postprocedural complications and disorders of digestive system: Secondary | ICD-10-CM | POA: Diagnosis not present

## 2019-04-15 DIAGNOSIS — I440 Atrioventricular block, first degree: Secondary | ICD-10-CM | POA: Diagnosis not present

## 2019-04-15 DIAGNOSIS — E1122 Type 2 diabetes mellitus with diabetic chronic kidney disease: Secondary | ICD-10-CM | POA: Diagnosis not present

## 2019-04-15 DIAGNOSIS — I4581 Long QT syndrome: Secondary | ICD-10-CM | POA: Diagnosis not present

## 2019-04-15 DIAGNOSIS — N186 End stage renal disease: Secondary | ICD-10-CM | POA: Diagnosis not present

## 2019-04-16 DIAGNOSIS — N186 End stage renal disease: Secondary | ICD-10-CM | POA: Diagnosis not present

## 2019-04-16 DIAGNOSIS — E1121 Type 2 diabetes mellitus with diabetic nephropathy: Secondary | ICD-10-CM | POA: Diagnosis not present

## 2019-04-16 DIAGNOSIS — N2581 Secondary hyperparathyroidism of renal origin: Secondary | ICD-10-CM | POA: Diagnosis not present

## 2019-04-19 DIAGNOSIS — N186 End stage renal disease: Secondary | ICD-10-CM | POA: Diagnosis not present

## 2019-04-19 DIAGNOSIS — N2581 Secondary hyperparathyroidism of renal origin: Secondary | ICD-10-CM | POA: Diagnosis not present

## 2019-04-19 DIAGNOSIS — E1121 Type 2 diabetes mellitus with diabetic nephropathy: Secondary | ICD-10-CM | POA: Diagnosis not present

## 2019-04-21 DIAGNOSIS — E1121 Type 2 diabetes mellitus with diabetic nephropathy: Secondary | ICD-10-CM | POA: Diagnosis not present

## 2019-04-21 DIAGNOSIS — N2581 Secondary hyperparathyroidism of renal origin: Secondary | ICD-10-CM | POA: Diagnosis not present

## 2019-04-21 DIAGNOSIS — N186 End stage renal disease: Secondary | ICD-10-CM | POA: Diagnosis not present

## 2019-04-22 DIAGNOSIS — E782 Mixed hyperlipidemia: Secondary | ICD-10-CM | POA: Diagnosis not present

## 2019-04-22 DIAGNOSIS — Z992 Dependence on renal dialysis: Secondary | ICD-10-CM | POA: Diagnosis not present

## 2019-04-22 DIAGNOSIS — I2510 Atherosclerotic heart disease of native coronary artery without angina pectoris: Secondary | ICD-10-CM | POA: Diagnosis not present

## 2019-04-22 DIAGNOSIS — N186 End stage renal disease: Secondary | ICD-10-CM | POA: Diagnosis not present

## 2019-04-22 DIAGNOSIS — R9431 Abnormal electrocardiogram [ECG] [EKG]: Secondary | ICD-10-CM | POA: Diagnosis not present

## 2019-04-23 DIAGNOSIS — N186 End stage renal disease: Secondary | ICD-10-CM | POA: Diagnosis not present

## 2019-04-23 DIAGNOSIS — E1121 Type 2 diabetes mellitus with diabetic nephropathy: Secondary | ICD-10-CM | POA: Diagnosis not present

## 2019-04-23 DIAGNOSIS — N2581 Secondary hyperparathyroidism of renal origin: Secondary | ICD-10-CM | POA: Diagnosis not present

## 2019-04-25 DIAGNOSIS — Z992 Dependence on renal dialysis: Secondary | ICD-10-CM | POA: Diagnosis not present

## 2019-04-25 DIAGNOSIS — E1122 Type 2 diabetes mellitus with diabetic chronic kidney disease: Secondary | ICD-10-CM | POA: Diagnosis not present

## 2019-04-25 DIAGNOSIS — Z09 Encounter for follow-up examination after completed treatment for conditions other than malignant neoplasm: Secondary | ICD-10-CM | POA: Diagnosis not present

## 2019-04-25 DIAGNOSIS — N186 End stage renal disease: Secondary | ICD-10-CM | POA: Diagnosis not present

## 2019-04-26 DIAGNOSIS — N2581 Secondary hyperparathyroidism of renal origin: Secondary | ICD-10-CM | POA: Diagnosis not present

## 2019-04-26 DIAGNOSIS — N186 End stage renal disease: Secondary | ICD-10-CM | POA: Diagnosis not present

## 2019-04-26 DIAGNOSIS — E1121 Type 2 diabetes mellitus with diabetic nephropathy: Secondary | ICD-10-CM | POA: Diagnosis not present

## 2019-04-28 DIAGNOSIS — N2581 Secondary hyperparathyroidism of renal origin: Secondary | ICD-10-CM | POA: Diagnosis not present

## 2019-04-28 DIAGNOSIS — E1121 Type 2 diabetes mellitus with diabetic nephropathy: Secondary | ICD-10-CM | POA: Diagnosis not present

## 2019-04-28 DIAGNOSIS — N186 End stage renal disease: Secondary | ICD-10-CM | POA: Diagnosis not present

## 2019-04-30 DIAGNOSIS — E1121 Type 2 diabetes mellitus with diabetic nephropathy: Secondary | ICD-10-CM | POA: Diagnosis not present

## 2019-04-30 DIAGNOSIS — N186 End stage renal disease: Secondary | ICD-10-CM | POA: Diagnosis not present

## 2019-04-30 DIAGNOSIS — N2581 Secondary hyperparathyroidism of renal origin: Secondary | ICD-10-CM | POA: Diagnosis not present

## 2019-05-03 DIAGNOSIS — E1121 Type 2 diabetes mellitus with diabetic nephropathy: Secondary | ICD-10-CM | POA: Diagnosis not present

## 2019-05-03 DIAGNOSIS — N2581 Secondary hyperparathyroidism of renal origin: Secondary | ICD-10-CM | POA: Diagnosis not present

## 2019-05-03 DIAGNOSIS — N186 End stage renal disease: Secondary | ICD-10-CM | POA: Diagnosis not present

## 2019-05-05 DIAGNOSIS — N2581 Secondary hyperparathyroidism of renal origin: Secondary | ICD-10-CM | POA: Diagnosis not present

## 2019-05-05 DIAGNOSIS — N186 End stage renal disease: Secondary | ICD-10-CM | POA: Diagnosis not present

## 2019-05-05 DIAGNOSIS — E1121 Type 2 diabetes mellitus with diabetic nephropathy: Secondary | ICD-10-CM | POA: Diagnosis not present

## 2019-05-07 DIAGNOSIS — N2581 Secondary hyperparathyroidism of renal origin: Secondary | ICD-10-CM | POA: Diagnosis not present

## 2019-05-07 DIAGNOSIS — N186 End stage renal disease: Secondary | ICD-10-CM | POA: Diagnosis not present

## 2019-05-07 DIAGNOSIS — E1121 Type 2 diabetes mellitus with diabetic nephropathy: Secondary | ICD-10-CM | POA: Diagnosis not present

## 2019-05-10 DIAGNOSIS — E1121 Type 2 diabetes mellitus with diabetic nephropathy: Secondary | ICD-10-CM | POA: Diagnosis not present

## 2019-05-10 DIAGNOSIS — N2581 Secondary hyperparathyroidism of renal origin: Secondary | ICD-10-CM | POA: Diagnosis not present

## 2019-05-10 DIAGNOSIS — N186 End stage renal disease: Secondary | ICD-10-CM | POA: Diagnosis not present

## 2019-05-12 DIAGNOSIS — N2581 Secondary hyperparathyroidism of renal origin: Secondary | ICD-10-CM | POA: Diagnosis not present

## 2019-05-12 DIAGNOSIS — N186 End stage renal disease: Secondary | ICD-10-CM | POA: Diagnosis not present

## 2019-05-12 DIAGNOSIS — E1121 Type 2 diabetes mellitus with diabetic nephropathy: Secondary | ICD-10-CM | POA: Diagnosis not present

## 2019-05-14 DIAGNOSIS — E1121 Type 2 diabetes mellitus with diabetic nephropathy: Secondary | ICD-10-CM | POA: Diagnosis not present

## 2019-05-14 DIAGNOSIS — N186 End stage renal disease: Secondary | ICD-10-CM | POA: Diagnosis not present

## 2019-05-14 DIAGNOSIS — N2581 Secondary hyperparathyroidism of renal origin: Secondary | ICD-10-CM | POA: Diagnosis not present

## 2019-05-17 DIAGNOSIS — E1121 Type 2 diabetes mellitus with diabetic nephropathy: Secondary | ICD-10-CM | POA: Diagnosis not present

## 2019-05-17 DIAGNOSIS — N186 End stage renal disease: Secondary | ICD-10-CM | POA: Diagnosis not present

## 2019-05-17 DIAGNOSIS — N2581 Secondary hyperparathyroidism of renal origin: Secondary | ICD-10-CM | POA: Diagnosis not present

## 2019-05-19 DIAGNOSIS — N2581 Secondary hyperparathyroidism of renal origin: Secondary | ICD-10-CM | POA: Diagnosis not present

## 2019-05-19 DIAGNOSIS — N186 End stage renal disease: Secondary | ICD-10-CM | POA: Diagnosis not present

## 2019-05-19 DIAGNOSIS — E1121 Type 2 diabetes mellitus with diabetic nephropathy: Secondary | ICD-10-CM | POA: Diagnosis not present

## 2019-05-21 DIAGNOSIS — N2581 Secondary hyperparathyroidism of renal origin: Secondary | ICD-10-CM | POA: Diagnosis not present

## 2019-05-21 DIAGNOSIS — N186 End stage renal disease: Secondary | ICD-10-CM | POA: Diagnosis not present

## 2019-05-21 DIAGNOSIS — E1121 Type 2 diabetes mellitus with diabetic nephropathy: Secondary | ICD-10-CM | POA: Diagnosis not present

## 2019-05-24 DIAGNOSIS — E1121 Type 2 diabetes mellitus with diabetic nephropathy: Secondary | ICD-10-CM | POA: Diagnosis not present

## 2019-05-24 DIAGNOSIS — N2581 Secondary hyperparathyroidism of renal origin: Secondary | ICD-10-CM | POA: Diagnosis not present

## 2019-05-24 DIAGNOSIS — N186 End stage renal disease: Secondary | ICD-10-CM | POA: Diagnosis not present

## 2019-05-25 DIAGNOSIS — E1122 Type 2 diabetes mellitus with diabetic chronic kidney disease: Secondary | ICD-10-CM | POA: Diagnosis not present

## 2019-05-25 DIAGNOSIS — N186 End stage renal disease: Secondary | ICD-10-CM | POA: Diagnosis not present

## 2019-05-25 DIAGNOSIS — Z992 Dependence on renal dialysis: Secondary | ICD-10-CM | POA: Diagnosis not present

## 2019-05-26 DIAGNOSIS — N186 End stage renal disease: Secondary | ICD-10-CM | POA: Diagnosis not present

## 2019-05-26 DIAGNOSIS — E1121 Type 2 diabetes mellitus with diabetic nephropathy: Secondary | ICD-10-CM | POA: Diagnosis not present

## 2019-05-26 DIAGNOSIS — N2581 Secondary hyperparathyroidism of renal origin: Secondary | ICD-10-CM | POA: Diagnosis not present

## 2019-05-28 DIAGNOSIS — E1121 Type 2 diabetes mellitus with diabetic nephropathy: Secondary | ICD-10-CM | POA: Diagnosis not present

## 2019-05-28 DIAGNOSIS — N186 End stage renal disease: Secondary | ICD-10-CM | POA: Diagnosis not present

## 2019-05-28 DIAGNOSIS — N2581 Secondary hyperparathyroidism of renal origin: Secondary | ICD-10-CM | POA: Diagnosis not present

## 2019-05-30 DIAGNOSIS — Z0000 Encounter for general adult medical examination without abnormal findings: Secondary | ICD-10-CM | POA: Diagnosis not present

## 2019-05-31 DIAGNOSIS — N186 End stage renal disease: Secondary | ICD-10-CM | POA: Diagnosis not present

## 2019-05-31 DIAGNOSIS — E1121 Type 2 diabetes mellitus with diabetic nephropathy: Secondary | ICD-10-CM | POA: Diagnosis not present

## 2019-05-31 DIAGNOSIS — N2581 Secondary hyperparathyroidism of renal origin: Secondary | ICD-10-CM | POA: Diagnosis not present

## 2019-06-02 DIAGNOSIS — N2581 Secondary hyperparathyroidism of renal origin: Secondary | ICD-10-CM | POA: Diagnosis not present

## 2019-06-02 DIAGNOSIS — E1121 Type 2 diabetes mellitus with diabetic nephropathy: Secondary | ICD-10-CM | POA: Diagnosis not present

## 2019-06-02 DIAGNOSIS — N186 End stage renal disease: Secondary | ICD-10-CM | POA: Diagnosis not present

## 2019-06-04 DIAGNOSIS — E1121 Type 2 diabetes mellitus with diabetic nephropathy: Secondary | ICD-10-CM | POA: Diagnosis not present

## 2019-06-04 DIAGNOSIS — N2581 Secondary hyperparathyroidism of renal origin: Secondary | ICD-10-CM | POA: Diagnosis not present

## 2019-06-04 DIAGNOSIS — N186 End stage renal disease: Secondary | ICD-10-CM | POA: Diagnosis not present

## 2019-06-07 DIAGNOSIS — E1121 Type 2 diabetes mellitus with diabetic nephropathy: Secondary | ICD-10-CM | POA: Diagnosis not present

## 2019-06-07 DIAGNOSIS — N2581 Secondary hyperparathyroidism of renal origin: Secondary | ICD-10-CM | POA: Diagnosis not present

## 2019-06-07 DIAGNOSIS — N186 End stage renal disease: Secondary | ICD-10-CM | POA: Diagnosis not present

## 2019-06-09 DIAGNOSIS — N186 End stage renal disease: Secondary | ICD-10-CM | POA: Diagnosis not present

## 2019-06-09 DIAGNOSIS — N2581 Secondary hyperparathyroidism of renal origin: Secondary | ICD-10-CM | POA: Diagnosis not present

## 2019-06-09 DIAGNOSIS — E1121 Type 2 diabetes mellitus with diabetic nephropathy: Secondary | ICD-10-CM | POA: Diagnosis not present

## 2019-06-10 DIAGNOSIS — H31011 Macula scars of posterior pole (postinflammatory) (post-traumatic), right eye: Secondary | ICD-10-CM | POA: Diagnosis not present

## 2019-06-10 DIAGNOSIS — H3582 Retinal ischemia: Secondary | ICD-10-CM | POA: Diagnosis not present

## 2019-06-10 DIAGNOSIS — E113593 Type 2 diabetes mellitus with proliferative diabetic retinopathy without macular edema, bilateral: Secondary | ICD-10-CM | POA: Diagnosis not present

## 2019-06-11 DIAGNOSIS — N2581 Secondary hyperparathyroidism of renal origin: Secondary | ICD-10-CM | POA: Diagnosis not present

## 2019-06-11 DIAGNOSIS — N186 End stage renal disease: Secondary | ICD-10-CM | POA: Diagnosis not present

## 2019-06-11 DIAGNOSIS — E1121 Type 2 diabetes mellitus with diabetic nephropathy: Secondary | ICD-10-CM | POA: Diagnosis not present

## 2019-06-14 DIAGNOSIS — E1121 Type 2 diabetes mellitus with diabetic nephropathy: Secondary | ICD-10-CM | POA: Diagnosis not present

## 2019-06-14 DIAGNOSIS — N2581 Secondary hyperparathyroidism of renal origin: Secondary | ICD-10-CM | POA: Diagnosis not present

## 2019-06-14 DIAGNOSIS — N186 End stage renal disease: Secondary | ICD-10-CM | POA: Diagnosis not present

## 2019-06-15 DIAGNOSIS — R110 Nausea: Secondary | ICD-10-CM | POA: Diagnosis not present

## 2019-06-15 DIAGNOSIS — N186 End stage renal disease: Secondary | ICD-10-CM | POA: Diagnosis not present

## 2019-06-15 DIAGNOSIS — I2510 Atherosclerotic heart disease of native coronary artery without angina pectoris: Secondary | ICD-10-CM | POA: Diagnosis not present

## 2019-06-15 DIAGNOSIS — Z992 Dependence on renal dialysis: Secondary | ICD-10-CM | POA: Diagnosis not present

## 2019-06-15 DIAGNOSIS — I447 Left bundle-branch block, unspecified: Secondary | ICD-10-CM | POA: Diagnosis not present

## 2019-06-15 DIAGNOSIS — E782 Mixed hyperlipidemia: Secondary | ICD-10-CM | POA: Diagnosis not present

## 2019-06-16 DIAGNOSIS — N186 End stage renal disease: Secondary | ICD-10-CM | POA: Diagnosis not present

## 2019-06-16 DIAGNOSIS — E1121 Type 2 diabetes mellitus with diabetic nephropathy: Secondary | ICD-10-CM | POA: Diagnosis not present

## 2019-06-16 DIAGNOSIS — N2581 Secondary hyperparathyroidism of renal origin: Secondary | ICD-10-CM | POA: Diagnosis not present

## 2019-06-18 DIAGNOSIS — N186 End stage renal disease: Secondary | ICD-10-CM | POA: Diagnosis not present

## 2019-06-18 DIAGNOSIS — E1121 Type 2 diabetes mellitus with diabetic nephropathy: Secondary | ICD-10-CM | POA: Diagnosis not present

## 2019-06-18 DIAGNOSIS — N2581 Secondary hyperparathyroidism of renal origin: Secondary | ICD-10-CM | POA: Diagnosis not present

## 2019-06-21 DIAGNOSIS — E1121 Type 2 diabetes mellitus with diabetic nephropathy: Secondary | ICD-10-CM | POA: Diagnosis not present

## 2019-06-21 DIAGNOSIS — N186 End stage renal disease: Secondary | ICD-10-CM | POA: Diagnosis not present

## 2019-06-21 DIAGNOSIS — N2581 Secondary hyperparathyroidism of renal origin: Secondary | ICD-10-CM | POA: Diagnosis not present

## 2019-06-23 ENCOUNTER — Other Ambulatory Visit: Payer: Self-pay

## 2019-06-23 DIAGNOSIS — N2581 Secondary hyperparathyroidism of renal origin: Secondary | ICD-10-CM | POA: Diagnosis not present

## 2019-06-23 DIAGNOSIS — N186 End stage renal disease: Secondary | ICD-10-CM | POA: Diagnosis not present

## 2019-06-23 DIAGNOSIS — E1121 Type 2 diabetes mellitus with diabetic nephropathy: Secondary | ICD-10-CM | POA: Diagnosis not present

## 2019-06-25 DIAGNOSIS — E1122 Type 2 diabetes mellitus with diabetic chronic kidney disease: Secondary | ICD-10-CM | POA: Diagnosis not present

## 2019-06-25 DIAGNOSIS — N186 End stage renal disease: Secondary | ICD-10-CM | POA: Diagnosis not present

## 2019-06-25 DIAGNOSIS — Z992 Dependence on renal dialysis: Secondary | ICD-10-CM | POA: Diagnosis not present

## 2019-06-25 DIAGNOSIS — N2581 Secondary hyperparathyroidism of renal origin: Secondary | ICD-10-CM | POA: Diagnosis not present

## 2019-06-25 DIAGNOSIS — E1121 Type 2 diabetes mellitus with diabetic nephropathy: Secondary | ICD-10-CM | POA: Diagnosis not present

## 2019-06-28 DIAGNOSIS — N2581 Secondary hyperparathyroidism of renal origin: Secondary | ICD-10-CM | POA: Diagnosis not present

## 2019-06-28 DIAGNOSIS — N186 End stage renal disease: Secondary | ICD-10-CM | POA: Diagnosis not present

## 2019-06-28 DIAGNOSIS — E1121 Type 2 diabetes mellitus with diabetic nephropathy: Secondary | ICD-10-CM | POA: Diagnosis not present

## 2019-06-28 DIAGNOSIS — Z992 Dependence on renal dialysis: Secondary | ICD-10-CM | POA: Diagnosis not present

## 2019-06-30 DIAGNOSIS — Z992 Dependence on renal dialysis: Secondary | ICD-10-CM | POA: Diagnosis not present

## 2019-06-30 DIAGNOSIS — N2581 Secondary hyperparathyroidism of renal origin: Secondary | ICD-10-CM | POA: Diagnosis not present

## 2019-06-30 DIAGNOSIS — E1121 Type 2 diabetes mellitus with diabetic nephropathy: Secondary | ICD-10-CM | POA: Diagnosis not present

## 2019-06-30 DIAGNOSIS — N186 End stage renal disease: Secondary | ICD-10-CM | POA: Diagnosis not present

## 2019-07-02 DIAGNOSIS — Z992 Dependence on renal dialysis: Secondary | ICD-10-CM | POA: Diagnosis not present

## 2019-07-02 DIAGNOSIS — N186 End stage renal disease: Secondary | ICD-10-CM | POA: Diagnosis not present

## 2019-07-02 DIAGNOSIS — N2581 Secondary hyperparathyroidism of renal origin: Secondary | ICD-10-CM | POA: Diagnosis not present

## 2019-07-02 DIAGNOSIS — E1121 Type 2 diabetes mellitus with diabetic nephropathy: Secondary | ICD-10-CM | POA: Diagnosis not present

## 2019-07-05 DIAGNOSIS — E1121 Type 2 diabetes mellitus with diabetic nephropathy: Secondary | ICD-10-CM | POA: Diagnosis not present

## 2019-07-05 DIAGNOSIS — N186 End stage renal disease: Secondary | ICD-10-CM | POA: Diagnosis not present

## 2019-07-05 DIAGNOSIS — Z992 Dependence on renal dialysis: Secondary | ICD-10-CM | POA: Diagnosis not present

## 2019-07-05 DIAGNOSIS — N2581 Secondary hyperparathyroidism of renal origin: Secondary | ICD-10-CM | POA: Diagnosis not present

## 2019-07-07 DIAGNOSIS — Z992 Dependence on renal dialysis: Secondary | ICD-10-CM | POA: Diagnosis not present

## 2019-07-07 DIAGNOSIS — E1121 Type 2 diabetes mellitus with diabetic nephropathy: Secondary | ICD-10-CM | POA: Diagnosis not present

## 2019-07-07 DIAGNOSIS — N2581 Secondary hyperparathyroidism of renal origin: Secondary | ICD-10-CM | POA: Diagnosis not present

## 2019-07-07 DIAGNOSIS — N186 End stage renal disease: Secondary | ICD-10-CM | POA: Diagnosis not present

## 2019-07-09 DIAGNOSIS — N186 End stage renal disease: Secondary | ICD-10-CM | POA: Diagnosis not present

## 2019-07-09 DIAGNOSIS — N2581 Secondary hyperparathyroidism of renal origin: Secondary | ICD-10-CM | POA: Diagnosis not present

## 2019-07-09 DIAGNOSIS — Z992 Dependence on renal dialysis: Secondary | ICD-10-CM | POA: Diagnosis not present

## 2019-07-09 DIAGNOSIS — E1121 Type 2 diabetes mellitus with diabetic nephropathy: Secondary | ICD-10-CM | POA: Diagnosis not present

## 2019-07-12 DIAGNOSIS — E1121 Type 2 diabetes mellitus with diabetic nephropathy: Secondary | ICD-10-CM | POA: Diagnosis not present

## 2019-07-12 DIAGNOSIS — N2581 Secondary hyperparathyroidism of renal origin: Secondary | ICD-10-CM | POA: Diagnosis not present

## 2019-07-12 DIAGNOSIS — N186 End stage renal disease: Secondary | ICD-10-CM | POA: Diagnosis not present

## 2019-07-12 DIAGNOSIS — Z992 Dependence on renal dialysis: Secondary | ICD-10-CM | POA: Diagnosis not present

## 2019-07-14 DIAGNOSIS — N186 End stage renal disease: Secondary | ICD-10-CM | POA: Diagnosis not present

## 2019-07-14 DIAGNOSIS — Z992 Dependence on renal dialysis: Secondary | ICD-10-CM | POA: Diagnosis not present

## 2019-07-14 DIAGNOSIS — E1121 Type 2 diabetes mellitus with diabetic nephropathy: Secondary | ICD-10-CM | POA: Diagnosis not present

## 2019-07-14 DIAGNOSIS — N2581 Secondary hyperparathyroidism of renal origin: Secondary | ICD-10-CM | POA: Diagnosis not present

## 2019-07-16 DIAGNOSIS — E1121 Type 2 diabetes mellitus with diabetic nephropathy: Secondary | ICD-10-CM | POA: Diagnosis not present

## 2019-07-16 DIAGNOSIS — Z992 Dependence on renal dialysis: Secondary | ICD-10-CM | POA: Diagnosis not present

## 2019-07-16 DIAGNOSIS — N2581 Secondary hyperparathyroidism of renal origin: Secondary | ICD-10-CM | POA: Diagnosis not present

## 2019-07-16 DIAGNOSIS — N186 End stage renal disease: Secondary | ICD-10-CM | POA: Diagnosis not present

## 2019-07-18 DIAGNOSIS — Z5181 Encounter for therapeutic drug level monitoring: Secondary | ICD-10-CM | POA: Diagnosis not present

## 2019-07-18 DIAGNOSIS — Z79891 Long term (current) use of opiate analgesic: Secondary | ICD-10-CM | POA: Diagnosis not present

## 2019-07-18 DIAGNOSIS — Z79899 Other long term (current) drug therapy: Secondary | ICD-10-CM | POA: Diagnosis not present

## 2019-07-18 DIAGNOSIS — Z6833 Body mass index (BMI) 33.0-33.9, adult: Secondary | ICD-10-CM | POA: Diagnosis not present

## 2019-07-18 DIAGNOSIS — M25561 Pain in right knee: Secondary | ICD-10-CM | POA: Diagnosis not present

## 2019-07-18 DIAGNOSIS — I10 Essential (primary) hypertension: Secondary | ICD-10-CM | POA: Diagnosis not present

## 2019-07-18 DIAGNOSIS — M47816 Spondylosis without myelopathy or radiculopathy, lumbar region: Secondary | ICD-10-CM | POA: Diagnosis not present

## 2019-07-19 DIAGNOSIS — N2581 Secondary hyperparathyroidism of renal origin: Secondary | ICD-10-CM | POA: Diagnosis not present

## 2019-07-19 DIAGNOSIS — E1121 Type 2 diabetes mellitus with diabetic nephropathy: Secondary | ICD-10-CM | POA: Diagnosis not present

## 2019-07-19 DIAGNOSIS — Z992 Dependence on renal dialysis: Secondary | ICD-10-CM | POA: Diagnosis not present

## 2019-07-19 DIAGNOSIS — N186 End stage renal disease: Secondary | ICD-10-CM | POA: Diagnosis not present

## 2019-07-21 DIAGNOSIS — N186 End stage renal disease: Secondary | ICD-10-CM | POA: Diagnosis not present

## 2019-07-21 DIAGNOSIS — Z992 Dependence on renal dialysis: Secondary | ICD-10-CM | POA: Diagnosis not present

## 2019-07-21 DIAGNOSIS — E1121 Type 2 diabetes mellitus with diabetic nephropathy: Secondary | ICD-10-CM | POA: Diagnosis not present

## 2019-07-21 DIAGNOSIS — N2581 Secondary hyperparathyroidism of renal origin: Secondary | ICD-10-CM | POA: Diagnosis not present

## 2019-07-23 DIAGNOSIS — N186 End stage renal disease: Secondary | ICD-10-CM | POA: Diagnosis not present

## 2019-07-23 DIAGNOSIS — E1121 Type 2 diabetes mellitus with diabetic nephropathy: Secondary | ICD-10-CM | POA: Diagnosis not present

## 2019-07-23 DIAGNOSIS — Z992 Dependence on renal dialysis: Secondary | ICD-10-CM | POA: Diagnosis not present

## 2019-07-23 DIAGNOSIS — N2581 Secondary hyperparathyroidism of renal origin: Secondary | ICD-10-CM | POA: Diagnosis not present

## 2019-07-26 DIAGNOSIS — N186 End stage renal disease: Secondary | ICD-10-CM | POA: Diagnosis not present

## 2019-07-26 DIAGNOSIS — Z23 Encounter for immunization: Secondary | ICD-10-CM | POA: Diagnosis not present

## 2019-07-26 DIAGNOSIS — E1121 Type 2 diabetes mellitus with diabetic nephropathy: Secondary | ICD-10-CM | POA: Diagnosis not present

## 2019-07-26 DIAGNOSIS — Z992 Dependence on renal dialysis: Secondary | ICD-10-CM | POA: Diagnosis not present

## 2019-07-26 DIAGNOSIS — E1122 Type 2 diabetes mellitus with diabetic chronic kidney disease: Secondary | ICD-10-CM | POA: Diagnosis not present

## 2019-07-26 DIAGNOSIS — N2581 Secondary hyperparathyroidism of renal origin: Secondary | ICD-10-CM | POA: Diagnosis not present

## 2019-07-28 DIAGNOSIS — N186 End stage renal disease: Secondary | ICD-10-CM | POA: Diagnosis not present

## 2019-07-28 DIAGNOSIS — Z992 Dependence on renal dialysis: Secondary | ICD-10-CM | POA: Diagnosis not present

## 2019-07-28 DIAGNOSIS — N2581 Secondary hyperparathyroidism of renal origin: Secondary | ICD-10-CM | POA: Diagnosis not present

## 2019-07-28 DIAGNOSIS — Z23 Encounter for immunization: Secondary | ICD-10-CM | POA: Diagnosis not present

## 2019-07-28 DIAGNOSIS — E1121 Type 2 diabetes mellitus with diabetic nephropathy: Secondary | ICD-10-CM | POA: Diagnosis not present

## 2019-07-30 DIAGNOSIS — Z992 Dependence on renal dialysis: Secondary | ICD-10-CM | POA: Diagnosis not present

## 2019-07-30 DIAGNOSIS — N2581 Secondary hyperparathyroidism of renal origin: Secondary | ICD-10-CM | POA: Diagnosis not present

## 2019-07-30 DIAGNOSIS — N186 End stage renal disease: Secondary | ICD-10-CM | POA: Diagnosis not present

## 2019-07-30 DIAGNOSIS — E1121 Type 2 diabetes mellitus with diabetic nephropathy: Secondary | ICD-10-CM | POA: Diagnosis not present

## 2019-07-30 DIAGNOSIS — Z23 Encounter for immunization: Secondary | ICD-10-CM | POA: Diagnosis not present

## 2019-08-02 DIAGNOSIS — N2581 Secondary hyperparathyroidism of renal origin: Secondary | ICD-10-CM | POA: Diagnosis not present

## 2019-08-02 DIAGNOSIS — Z23 Encounter for immunization: Secondary | ICD-10-CM | POA: Diagnosis not present

## 2019-08-02 DIAGNOSIS — N186 End stage renal disease: Secondary | ICD-10-CM | POA: Diagnosis not present

## 2019-08-02 DIAGNOSIS — E1121 Type 2 diabetes mellitus with diabetic nephropathy: Secondary | ICD-10-CM | POA: Diagnosis not present

## 2019-08-02 DIAGNOSIS — Z992 Dependence on renal dialysis: Secondary | ICD-10-CM | POA: Diagnosis not present

## 2019-08-04 DIAGNOSIS — Z992 Dependence on renal dialysis: Secondary | ICD-10-CM | POA: Diagnosis not present

## 2019-08-04 DIAGNOSIS — N186 End stage renal disease: Secondary | ICD-10-CM | POA: Diagnosis not present

## 2019-08-04 DIAGNOSIS — N2581 Secondary hyperparathyroidism of renal origin: Secondary | ICD-10-CM | POA: Diagnosis not present

## 2019-08-04 DIAGNOSIS — E1121 Type 2 diabetes mellitus with diabetic nephropathy: Secondary | ICD-10-CM | POA: Diagnosis not present

## 2019-08-04 DIAGNOSIS — Z23 Encounter for immunization: Secondary | ICD-10-CM | POA: Diagnosis not present

## 2019-08-06 DIAGNOSIS — N186 End stage renal disease: Secondary | ICD-10-CM | POA: Diagnosis not present

## 2019-08-06 DIAGNOSIS — Z23 Encounter for immunization: Secondary | ICD-10-CM | POA: Diagnosis not present

## 2019-08-06 DIAGNOSIS — E1121 Type 2 diabetes mellitus with diabetic nephropathy: Secondary | ICD-10-CM | POA: Diagnosis not present

## 2019-08-06 DIAGNOSIS — Z992 Dependence on renal dialysis: Secondary | ICD-10-CM | POA: Diagnosis not present

## 2019-08-06 DIAGNOSIS — N2581 Secondary hyperparathyroidism of renal origin: Secondary | ICD-10-CM | POA: Diagnosis not present

## 2019-08-09 DIAGNOSIS — Z23 Encounter for immunization: Secondary | ICD-10-CM | POA: Diagnosis not present

## 2019-08-09 DIAGNOSIS — E1121 Type 2 diabetes mellitus with diabetic nephropathy: Secondary | ICD-10-CM | POA: Diagnosis not present

## 2019-08-09 DIAGNOSIS — N2581 Secondary hyperparathyroidism of renal origin: Secondary | ICD-10-CM | POA: Diagnosis not present

## 2019-08-09 DIAGNOSIS — N186 End stage renal disease: Secondary | ICD-10-CM | POA: Diagnosis not present

## 2019-08-09 DIAGNOSIS — Z992 Dependence on renal dialysis: Secondary | ICD-10-CM | POA: Diagnosis not present

## 2019-08-11 DIAGNOSIS — Z23 Encounter for immunization: Secondary | ICD-10-CM | POA: Diagnosis not present

## 2019-08-11 DIAGNOSIS — N186 End stage renal disease: Secondary | ICD-10-CM | POA: Diagnosis not present

## 2019-08-11 DIAGNOSIS — E1121 Type 2 diabetes mellitus with diabetic nephropathy: Secondary | ICD-10-CM | POA: Diagnosis not present

## 2019-08-11 DIAGNOSIS — N2581 Secondary hyperparathyroidism of renal origin: Secondary | ICD-10-CM | POA: Diagnosis not present

## 2019-08-11 DIAGNOSIS — Z992 Dependence on renal dialysis: Secondary | ICD-10-CM | POA: Diagnosis not present

## 2019-08-13 DIAGNOSIS — Z992 Dependence on renal dialysis: Secondary | ICD-10-CM | POA: Diagnosis not present

## 2019-08-13 DIAGNOSIS — E1121 Type 2 diabetes mellitus with diabetic nephropathy: Secondary | ICD-10-CM | POA: Diagnosis not present

## 2019-08-13 DIAGNOSIS — N2581 Secondary hyperparathyroidism of renal origin: Secondary | ICD-10-CM | POA: Diagnosis not present

## 2019-08-13 DIAGNOSIS — N186 End stage renal disease: Secondary | ICD-10-CM | POA: Diagnosis not present

## 2019-08-13 DIAGNOSIS — Z23 Encounter for immunization: Secondary | ICD-10-CM | POA: Diagnosis not present

## 2019-08-16 DIAGNOSIS — Z23 Encounter for immunization: Secondary | ICD-10-CM | POA: Diagnosis not present

## 2019-08-16 DIAGNOSIS — Z992 Dependence on renal dialysis: Secondary | ICD-10-CM | POA: Diagnosis not present

## 2019-08-16 DIAGNOSIS — E1121 Type 2 diabetes mellitus with diabetic nephropathy: Secondary | ICD-10-CM | POA: Diagnosis not present

## 2019-08-16 DIAGNOSIS — N186 End stage renal disease: Secondary | ICD-10-CM | POA: Diagnosis not present

## 2019-08-16 DIAGNOSIS — N2581 Secondary hyperparathyroidism of renal origin: Secondary | ICD-10-CM | POA: Diagnosis not present

## 2019-08-18 DIAGNOSIS — Z992 Dependence on renal dialysis: Secondary | ICD-10-CM | POA: Diagnosis not present

## 2019-08-18 DIAGNOSIS — N186 End stage renal disease: Secondary | ICD-10-CM | POA: Diagnosis not present

## 2019-08-18 DIAGNOSIS — Z23 Encounter for immunization: Secondary | ICD-10-CM | POA: Diagnosis not present

## 2019-08-18 DIAGNOSIS — E1121 Type 2 diabetes mellitus with diabetic nephropathy: Secondary | ICD-10-CM | POA: Diagnosis not present

## 2019-08-18 DIAGNOSIS — N2581 Secondary hyperparathyroidism of renal origin: Secondary | ICD-10-CM | POA: Diagnosis not present

## 2019-08-20 DIAGNOSIS — Z23 Encounter for immunization: Secondary | ICD-10-CM | POA: Diagnosis not present

## 2019-08-20 DIAGNOSIS — N186 End stage renal disease: Secondary | ICD-10-CM | POA: Diagnosis not present

## 2019-08-20 DIAGNOSIS — Z992 Dependence on renal dialysis: Secondary | ICD-10-CM | POA: Diagnosis not present

## 2019-08-20 DIAGNOSIS — N2581 Secondary hyperparathyroidism of renal origin: Secondary | ICD-10-CM | POA: Diagnosis not present

## 2019-08-20 DIAGNOSIS — E1121 Type 2 diabetes mellitus with diabetic nephropathy: Secondary | ICD-10-CM | POA: Diagnosis not present

## 2019-08-23 DIAGNOSIS — N186 End stage renal disease: Secondary | ICD-10-CM | POA: Diagnosis not present

## 2019-08-23 DIAGNOSIS — Z992 Dependence on renal dialysis: Secondary | ICD-10-CM | POA: Diagnosis not present

## 2019-08-23 DIAGNOSIS — E1121 Type 2 diabetes mellitus with diabetic nephropathy: Secondary | ICD-10-CM | POA: Diagnosis not present

## 2019-08-23 DIAGNOSIS — Z23 Encounter for immunization: Secondary | ICD-10-CM | POA: Diagnosis not present

## 2019-08-23 DIAGNOSIS — N2581 Secondary hyperparathyroidism of renal origin: Secondary | ICD-10-CM | POA: Diagnosis not present

## 2019-08-25 DIAGNOSIS — Z992 Dependence on renal dialysis: Secondary | ICD-10-CM | POA: Diagnosis not present

## 2019-08-25 DIAGNOSIS — E1122 Type 2 diabetes mellitus with diabetic chronic kidney disease: Secondary | ICD-10-CM | POA: Diagnosis not present

## 2019-08-25 DIAGNOSIS — E1121 Type 2 diabetes mellitus with diabetic nephropathy: Secondary | ICD-10-CM | POA: Diagnosis not present

## 2019-08-25 DIAGNOSIS — N2581 Secondary hyperparathyroidism of renal origin: Secondary | ICD-10-CM | POA: Diagnosis not present

## 2019-08-25 DIAGNOSIS — N186 End stage renal disease: Secondary | ICD-10-CM | POA: Diagnosis not present

## 2019-08-27 DIAGNOSIS — E1121 Type 2 diabetes mellitus with diabetic nephropathy: Secondary | ICD-10-CM | POA: Diagnosis not present

## 2019-08-27 DIAGNOSIS — N186 End stage renal disease: Secondary | ICD-10-CM | POA: Diagnosis not present

## 2019-08-27 DIAGNOSIS — Z992 Dependence on renal dialysis: Secondary | ICD-10-CM | POA: Diagnosis not present

## 2019-08-27 DIAGNOSIS — N2581 Secondary hyperparathyroidism of renal origin: Secondary | ICD-10-CM | POA: Diagnosis not present

## 2019-08-29 DIAGNOSIS — M47816 Spondylosis without myelopathy or radiculopathy, lumbar region: Secondary | ICD-10-CM | POA: Diagnosis not present

## 2019-08-30 DIAGNOSIS — N186 End stage renal disease: Secondary | ICD-10-CM | POA: Diagnosis not present

## 2019-08-30 DIAGNOSIS — N2581 Secondary hyperparathyroidism of renal origin: Secondary | ICD-10-CM | POA: Diagnosis not present

## 2019-08-30 DIAGNOSIS — E1121 Type 2 diabetes mellitus with diabetic nephropathy: Secondary | ICD-10-CM | POA: Diagnosis not present

## 2019-08-30 DIAGNOSIS — Z992 Dependence on renal dialysis: Secondary | ICD-10-CM | POA: Diagnosis not present

## 2019-09-01 DIAGNOSIS — E1121 Type 2 diabetes mellitus with diabetic nephropathy: Secondary | ICD-10-CM | POA: Diagnosis not present

## 2019-09-01 DIAGNOSIS — N2581 Secondary hyperparathyroidism of renal origin: Secondary | ICD-10-CM | POA: Diagnosis not present

## 2019-09-01 DIAGNOSIS — N186 End stage renal disease: Secondary | ICD-10-CM | POA: Diagnosis not present

## 2019-09-01 DIAGNOSIS — Z992 Dependence on renal dialysis: Secondary | ICD-10-CM | POA: Diagnosis not present

## 2019-09-03 DIAGNOSIS — N186 End stage renal disease: Secondary | ICD-10-CM | POA: Diagnosis not present

## 2019-09-03 DIAGNOSIS — Z992 Dependence on renal dialysis: Secondary | ICD-10-CM | POA: Diagnosis not present

## 2019-09-03 DIAGNOSIS — N2581 Secondary hyperparathyroidism of renal origin: Secondary | ICD-10-CM | POA: Diagnosis not present

## 2019-09-03 DIAGNOSIS — E1121 Type 2 diabetes mellitus with diabetic nephropathy: Secondary | ICD-10-CM | POA: Diagnosis not present

## 2019-09-05 DIAGNOSIS — M47816 Spondylosis without myelopathy or radiculopathy, lumbar region: Secondary | ICD-10-CM | POA: Diagnosis not present

## 2019-09-06 DIAGNOSIS — N186 End stage renal disease: Secondary | ICD-10-CM | POA: Diagnosis not present

## 2019-09-06 DIAGNOSIS — Z992 Dependence on renal dialysis: Secondary | ICD-10-CM | POA: Diagnosis not present

## 2019-09-06 DIAGNOSIS — E1121 Type 2 diabetes mellitus with diabetic nephropathy: Secondary | ICD-10-CM | POA: Diagnosis not present

## 2019-09-06 DIAGNOSIS — N2581 Secondary hyperparathyroidism of renal origin: Secondary | ICD-10-CM | POA: Diagnosis not present

## 2019-09-08 DIAGNOSIS — E1121 Type 2 diabetes mellitus with diabetic nephropathy: Secondary | ICD-10-CM | POA: Diagnosis not present

## 2019-09-08 DIAGNOSIS — Z992 Dependence on renal dialysis: Secondary | ICD-10-CM | POA: Diagnosis not present

## 2019-09-08 DIAGNOSIS — N2581 Secondary hyperparathyroidism of renal origin: Secondary | ICD-10-CM | POA: Diagnosis not present

## 2019-09-08 DIAGNOSIS — N186 End stage renal disease: Secondary | ICD-10-CM | POA: Diagnosis not present

## 2019-09-10 DIAGNOSIS — N2581 Secondary hyperparathyroidism of renal origin: Secondary | ICD-10-CM | POA: Diagnosis not present

## 2019-09-10 DIAGNOSIS — N186 End stage renal disease: Secondary | ICD-10-CM | POA: Diagnosis not present

## 2019-09-10 DIAGNOSIS — E1121 Type 2 diabetes mellitus with diabetic nephropathy: Secondary | ICD-10-CM | POA: Diagnosis not present

## 2019-09-10 DIAGNOSIS — Z992 Dependence on renal dialysis: Secondary | ICD-10-CM | POA: Diagnosis not present

## 2019-09-13 DIAGNOSIS — N186 End stage renal disease: Secondary | ICD-10-CM | POA: Diagnosis not present

## 2019-09-13 DIAGNOSIS — N2581 Secondary hyperparathyroidism of renal origin: Secondary | ICD-10-CM | POA: Diagnosis not present

## 2019-09-13 DIAGNOSIS — E1121 Type 2 diabetes mellitus with diabetic nephropathy: Secondary | ICD-10-CM | POA: Diagnosis not present

## 2019-09-13 DIAGNOSIS — Z992 Dependence on renal dialysis: Secondary | ICD-10-CM | POA: Diagnosis not present

## 2019-09-15 DIAGNOSIS — N2581 Secondary hyperparathyroidism of renal origin: Secondary | ICD-10-CM | POA: Diagnosis not present

## 2019-09-15 DIAGNOSIS — Z992 Dependence on renal dialysis: Secondary | ICD-10-CM | POA: Diagnosis not present

## 2019-09-15 DIAGNOSIS — N186 End stage renal disease: Secondary | ICD-10-CM | POA: Diagnosis not present

## 2019-09-15 DIAGNOSIS — E1121 Type 2 diabetes mellitus with diabetic nephropathy: Secondary | ICD-10-CM | POA: Diagnosis not present

## 2019-09-17 DIAGNOSIS — N186 End stage renal disease: Secondary | ICD-10-CM | POA: Diagnosis not present

## 2019-09-17 DIAGNOSIS — Z992 Dependence on renal dialysis: Secondary | ICD-10-CM | POA: Diagnosis not present

## 2019-09-17 DIAGNOSIS — N2581 Secondary hyperparathyroidism of renal origin: Secondary | ICD-10-CM | POA: Diagnosis not present

## 2019-09-17 DIAGNOSIS — E1121 Type 2 diabetes mellitus with diabetic nephropathy: Secondary | ICD-10-CM | POA: Diagnosis not present

## 2019-09-19 DIAGNOSIS — N186 End stage renal disease: Secondary | ICD-10-CM | POA: Diagnosis not present

## 2019-09-19 DIAGNOSIS — N2581 Secondary hyperparathyroidism of renal origin: Secondary | ICD-10-CM | POA: Diagnosis not present

## 2019-09-19 DIAGNOSIS — Z992 Dependence on renal dialysis: Secondary | ICD-10-CM | POA: Diagnosis not present

## 2019-09-19 DIAGNOSIS — E1121 Type 2 diabetes mellitus with diabetic nephropathy: Secondary | ICD-10-CM | POA: Diagnosis not present

## 2019-09-22 DIAGNOSIS — Z992 Dependence on renal dialysis: Secondary | ICD-10-CM | POA: Diagnosis not present

## 2019-09-22 DIAGNOSIS — N186 End stage renal disease: Secondary | ICD-10-CM | POA: Diagnosis not present

## 2019-09-22 DIAGNOSIS — N2581 Secondary hyperparathyroidism of renal origin: Secondary | ICD-10-CM | POA: Diagnosis not present

## 2019-09-22 DIAGNOSIS — E1121 Type 2 diabetes mellitus with diabetic nephropathy: Secondary | ICD-10-CM | POA: Diagnosis not present

## 2019-09-24 DIAGNOSIS — Z992 Dependence on renal dialysis: Secondary | ICD-10-CM | POA: Diagnosis not present

## 2019-09-24 DIAGNOSIS — E1121 Type 2 diabetes mellitus with diabetic nephropathy: Secondary | ICD-10-CM | POA: Diagnosis not present

## 2019-09-24 DIAGNOSIS — N2581 Secondary hyperparathyroidism of renal origin: Secondary | ICD-10-CM | POA: Diagnosis not present

## 2019-09-24 DIAGNOSIS — N186 End stage renal disease: Secondary | ICD-10-CM | POA: Diagnosis not present

## 2019-09-25 DIAGNOSIS — N186 End stage renal disease: Secondary | ICD-10-CM | POA: Diagnosis not present

## 2019-09-25 DIAGNOSIS — E1122 Type 2 diabetes mellitus with diabetic chronic kidney disease: Secondary | ICD-10-CM | POA: Diagnosis not present

## 2019-09-25 DIAGNOSIS — Z992 Dependence on renal dialysis: Secondary | ICD-10-CM | POA: Diagnosis not present

## 2019-09-27 ENCOUNTER — Other Ambulatory Visit: Payer: Self-pay

## 2019-09-27 DIAGNOSIS — N186 End stage renal disease: Secondary | ICD-10-CM | POA: Diagnosis not present

## 2019-09-27 DIAGNOSIS — Z992 Dependence on renal dialysis: Secondary | ICD-10-CM | POA: Diagnosis not present

## 2019-09-27 DIAGNOSIS — L03114 Cellulitis of left upper limb: Secondary | ICD-10-CM | POA: Diagnosis not present

## 2019-09-27 DIAGNOSIS — N2581 Secondary hyperparathyroidism of renal origin: Secondary | ICD-10-CM | POA: Diagnosis not present

## 2019-09-27 DIAGNOSIS — E1121 Type 2 diabetes mellitus with diabetic nephropathy: Secondary | ICD-10-CM | POA: Diagnosis not present

## 2019-09-29 DIAGNOSIS — N2581 Secondary hyperparathyroidism of renal origin: Secondary | ICD-10-CM | POA: Diagnosis not present

## 2019-09-29 DIAGNOSIS — L03114 Cellulitis of left upper limb: Secondary | ICD-10-CM | POA: Diagnosis not present

## 2019-09-29 DIAGNOSIS — Z992 Dependence on renal dialysis: Secondary | ICD-10-CM | POA: Diagnosis not present

## 2019-09-29 DIAGNOSIS — E1121 Type 2 diabetes mellitus with diabetic nephropathy: Secondary | ICD-10-CM | POA: Diagnosis not present

## 2019-09-29 DIAGNOSIS — N186 End stage renal disease: Secondary | ICD-10-CM | POA: Diagnosis not present

## 2019-10-01 DIAGNOSIS — E1121 Type 2 diabetes mellitus with diabetic nephropathy: Secondary | ICD-10-CM | POA: Diagnosis not present

## 2019-10-01 DIAGNOSIS — N186 End stage renal disease: Secondary | ICD-10-CM | POA: Diagnosis not present

## 2019-10-01 DIAGNOSIS — L03114 Cellulitis of left upper limb: Secondary | ICD-10-CM | POA: Diagnosis not present

## 2019-10-01 DIAGNOSIS — Z992 Dependence on renal dialysis: Secondary | ICD-10-CM | POA: Diagnosis not present

## 2019-10-01 DIAGNOSIS — N2581 Secondary hyperparathyroidism of renal origin: Secondary | ICD-10-CM | POA: Diagnosis not present

## 2019-10-04 DIAGNOSIS — N2581 Secondary hyperparathyroidism of renal origin: Secondary | ICD-10-CM | POA: Diagnosis not present

## 2019-10-04 DIAGNOSIS — E1121 Type 2 diabetes mellitus with diabetic nephropathy: Secondary | ICD-10-CM | POA: Diagnosis not present

## 2019-10-04 DIAGNOSIS — N186 End stage renal disease: Secondary | ICD-10-CM | POA: Diagnosis not present

## 2019-10-04 DIAGNOSIS — L03114 Cellulitis of left upper limb: Secondary | ICD-10-CM | POA: Diagnosis not present

## 2019-10-04 DIAGNOSIS — Z992 Dependence on renal dialysis: Secondary | ICD-10-CM | POA: Diagnosis not present

## 2019-10-06 DIAGNOSIS — Z992 Dependence on renal dialysis: Secondary | ICD-10-CM | POA: Diagnosis not present

## 2019-10-06 DIAGNOSIS — L03114 Cellulitis of left upper limb: Secondary | ICD-10-CM | POA: Diagnosis not present

## 2019-10-06 DIAGNOSIS — N2581 Secondary hyperparathyroidism of renal origin: Secondary | ICD-10-CM | POA: Diagnosis not present

## 2019-10-06 DIAGNOSIS — N186 End stage renal disease: Secondary | ICD-10-CM | POA: Diagnosis not present

## 2019-10-06 DIAGNOSIS — E1121 Type 2 diabetes mellitus with diabetic nephropathy: Secondary | ICD-10-CM | POA: Diagnosis not present

## 2019-10-08 DIAGNOSIS — Z992 Dependence on renal dialysis: Secondary | ICD-10-CM | POA: Diagnosis not present

## 2019-10-08 DIAGNOSIS — N2581 Secondary hyperparathyroidism of renal origin: Secondary | ICD-10-CM | POA: Diagnosis not present

## 2019-10-08 DIAGNOSIS — E1121 Type 2 diabetes mellitus with diabetic nephropathy: Secondary | ICD-10-CM | POA: Diagnosis not present

## 2019-10-08 DIAGNOSIS — N186 End stage renal disease: Secondary | ICD-10-CM | POA: Diagnosis not present

## 2019-10-08 DIAGNOSIS — L03114 Cellulitis of left upper limb: Secondary | ICD-10-CM | POA: Diagnosis not present

## 2019-10-11 DIAGNOSIS — E1121 Type 2 diabetes mellitus with diabetic nephropathy: Secondary | ICD-10-CM | POA: Diagnosis not present

## 2019-10-11 DIAGNOSIS — N186 End stage renal disease: Secondary | ICD-10-CM | POA: Diagnosis not present

## 2019-10-11 DIAGNOSIS — L03114 Cellulitis of left upper limb: Secondary | ICD-10-CM | POA: Diagnosis not present

## 2019-10-11 DIAGNOSIS — N2581 Secondary hyperparathyroidism of renal origin: Secondary | ICD-10-CM | POA: Diagnosis not present

## 2019-10-11 DIAGNOSIS — Z992 Dependence on renal dialysis: Secondary | ICD-10-CM | POA: Diagnosis not present

## 2019-10-13 DIAGNOSIS — E1121 Type 2 diabetes mellitus with diabetic nephropathy: Secondary | ICD-10-CM | POA: Diagnosis not present

## 2019-10-13 DIAGNOSIS — E875 Hyperkalemia: Secondary | ICD-10-CM | POA: Diagnosis not present

## 2019-10-13 DIAGNOSIS — N186 End stage renal disease: Secondary | ICD-10-CM | POA: Diagnosis not present

## 2019-10-13 DIAGNOSIS — L03114 Cellulitis of left upper limb: Secondary | ICD-10-CM | POA: Diagnosis not present

## 2019-10-13 DIAGNOSIS — Z992 Dependence on renal dialysis: Secondary | ICD-10-CM | POA: Diagnosis not present

## 2019-10-13 DIAGNOSIS — N2581 Secondary hyperparathyroidism of renal origin: Secondary | ICD-10-CM | POA: Diagnosis not present

## 2019-10-14 DIAGNOSIS — N186 End stage renal disease: Secondary | ICD-10-CM | POA: Diagnosis not present

## 2019-10-14 DIAGNOSIS — Z992 Dependence on renal dialysis: Secondary | ICD-10-CM | POA: Diagnosis not present

## 2019-10-14 DIAGNOSIS — T82868A Thrombosis of vascular prosthetic devices, implants and grafts, initial encounter: Secondary | ICD-10-CM | POA: Diagnosis not present

## 2019-10-15 DIAGNOSIS — Z992 Dependence on renal dialysis: Secondary | ICD-10-CM | POA: Diagnosis not present

## 2019-10-15 DIAGNOSIS — N186 End stage renal disease: Secondary | ICD-10-CM | POA: Diagnosis not present

## 2019-10-15 DIAGNOSIS — N2581 Secondary hyperparathyroidism of renal origin: Secondary | ICD-10-CM | POA: Diagnosis not present

## 2019-10-15 DIAGNOSIS — E1121 Type 2 diabetes mellitus with diabetic nephropathy: Secondary | ICD-10-CM | POA: Diagnosis not present

## 2019-10-15 DIAGNOSIS — L03114 Cellulitis of left upper limb: Secondary | ICD-10-CM | POA: Diagnosis not present

## 2019-10-17 DIAGNOSIS — M47816 Spondylosis without myelopathy or radiculopathy, lumbar region: Secondary | ICD-10-CM | POA: Diagnosis not present

## 2019-10-18 DIAGNOSIS — E1121 Type 2 diabetes mellitus with diabetic nephropathy: Secondary | ICD-10-CM | POA: Diagnosis not present

## 2019-10-18 DIAGNOSIS — Z992 Dependence on renal dialysis: Secondary | ICD-10-CM | POA: Diagnosis not present

## 2019-10-18 DIAGNOSIS — L03114 Cellulitis of left upper limb: Secondary | ICD-10-CM | POA: Diagnosis not present

## 2019-10-18 DIAGNOSIS — N186 End stage renal disease: Secondary | ICD-10-CM | POA: Diagnosis not present

## 2019-10-18 DIAGNOSIS — N2581 Secondary hyperparathyroidism of renal origin: Secondary | ICD-10-CM | POA: Diagnosis not present

## 2019-10-20 DIAGNOSIS — Z992 Dependence on renal dialysis: Secondary | ICD-10-CM | POA: Diagnosis not present

## 2019-10-20 DIAGNOSIS — N186 End stage renal disease: Secondary | ICD-10-CM | POA: Diagnosis not present

## 2019-10-20 DIAGNOSIS — N2581 Secondary hyperparathyroidism of renal origin: Secondary | ICD-10-CM | POA: Diagnosis not present

## 2019-10-20 DIAGNOSIS — L03114 Cellulitis of left upper limb: Secondary | ICD-10-CM | POA: Diagnosis not present

## 2019-10-20 DIAGNOSIS — E1121 Type 2 diabetes mellitus with diabetic nephropathy: Secondary | ICD-10-CM | POA: Diagnosis not present

## 2019-10-22 DIAGNOSIS — L03114 Cellulitis of left upper limb: Secondary | ICD-10-CM | POA: Diagnosis not present

## 2019-10-22 DIAGNOSIS — N2581 Secondary hyperparathyroidism of renal origin: Secondary | ICD-10-CM | POA: Diagnosis not present

## 2019-10-22 DIAGNOSIS — Z992 Dependence on renal dialysis: Secondary | ICD-10-CM | POA: Diagnosis not present

## 2019-10-22 DIAGNOSIS — N186 End stage renal disease: Secondary | ICD-10-CM | POA: Diagnosis not present

## 2019-10-22 DIAGNOSIS — E1121 Type 2 diabetes mellitus with diabetic nephropathy: Secondary | ICD-10-CM | POA: Diagnosis not present

## 2019-10-25 ENCOUNTER — Emergency Department (HOSPITAL_COMMUNITY)
Admission: EM | Admit: 2019-10-25 | Discharge: 2019-10-26 | Disposition: A | Discharge status: Home / Self Care | Payer: Medicare Other | Attending: Emergency Medicine | Admitting: Emergency Medicine

## 2019-10-25 ENCOUNTER — Other Ambulatory Visit: Payer: Self-pay

## 2019-10-25 ENCOUNTER — Telehealth: Payer: Self-pay | Admitting: *Deleted

## 2019-10-25 ENCOUNTER — Encounter (HOSPITAL_COMMUNITY): Payer: Self-pay | Admitting: Emergency Medicine

## 2019-10-25 DIAGNOSIS — Z20828 Contact with and (suspected) exposure to other viral communicable diseases: Secondary | ICD-10-CM | POA: Insufficient documentation

## 2019-10-25 DIAGNOSIS — T8249XA Other complication of vascular dialysis catheter, initial encounter: Secondary | ICD-10-CM | POA: Diagnosis not present

## 2019-10-25 DIAGNOSIS — Z5321 Procedure and treatment not carried out due to patient leaving prior to being seen by health care provider: Secondary | ICD-10-CM | POA: Diagnosis not present

## 2019-10-25 DIAGNOSIS — I2510 Atherosclerotic heart disease of native coronary artery without angina pectoris: Secondary | ICD-10-CM | POA: Diagnosis not present

## 2019-10-25 DIAGNOSIS — T82590A Other mechanical complication of surgically created arteriovenous fistula, initial encounter: Secondary | ICD-10-CM | POA: Insufficient documentation

## 2019-10-25 DIAGNOSIS — I4891 Unspecified atrial fibrillation: Secondary | ICD-10-CM | POA: Diagnosis not present

## 2019-10-25 DIAGNOSIS — Z03818 Encounter for observation for suspected exposure to other biological agents ruled out: Secondary | ICD-10-CM | POA: Diagnosis not present

## 2019-10-25 DIAGNOSIS — N186 End stage renal disease: Secondary | ICD-10-CM | POA: Diagnosis not present

## 2019-10-25 DIAGNOSIS — Y712 Prosthetic and other implants, materials and accessory cardiovascular devices associated with adverse incidents: Secondary | ICD-10-CM | POA: Diagnosis not present

## 2019-10-25 DIAGNOSIS — Z992 Dependence on renal dialysis: Secondary | ICD-10-CM | POA: Diagnosis not present

## 2019-10-25 LAB — CBC WITH DIFFERENTIAL/PLATELET
Abs Immature Granulocytes: 0.04 10*3/uL (ref 0.00–0.07)
Basophils Absolute: 0.1 10*3/uL (ref 0.0–0.1)
Basophils Relative: 1 %
Eosinophils Absolute: 0.2 10*3/uL (ref 0.0–0.5)
Eosinophils Relative: 2 %
HCT: 46.6 % (ref 39.0–52.0)
Hemoglobin: 15 g/dL (ref 13.0–17.0)
Immature Granulocytes: 1 %
Lymphocytes Relative: 16 %
Lymphs Abs: 1.3 10*3/uL (ref 0.7–4.0)
MCH: 31.1 pg (ref 26.0–34.0)
MCHC: 32.2 g/dL (ref 30.0–36.0)
MCV: 96.5 fL (ref 80.0–100.0)
Monocytes Absolute: 0.6 10*3/uL (ref 0.1–1.0)
Monocytes Relative: 8 %
Neutro Abs: 5.8 10*3/uL (ref 1.7–7.7)
Neutrophils Relative %: 72 %
Platelets: 284 10*3/uL (ref 150–400)
RBC: 4.83 MIL/uL (ref 4.22–5.81)
RDW: 14.3 % (ref 11.5–15.5)
WBC: 8.1 10*3/uL (ref 4.0–10.5)
nRBC: 0 % (ref 0.0–0.2)

## 2019-10-25 LAB — COMPREHENSIVE METABOLIC PANEL
ALT: 20 U/L (ref 0–44)
AST: 19 U/L (ref 15–41)
Albumin: 3.7 g/dL (ref 3.5–5.0)
Alkaline Phosphatase: 162 U/L — ABNORMAL HIGH (ref 38–126)
Anion gap: 16 — ABNORMAL HIGH (ref 5–15)
BUN: 21 mg/dL (ref 8–23)
CO2: 26 mmol/L (ref 22–32)
Calcium: 9.7 mg/dL (ref 8.9–10.3)
Chloride: 96 mmol/L — ABNORMAL LOW (ref 98–111)
Creatinine, Ser: 6.35 mg/dL — ABNORMAL HIGH (ref 0.61–1.24)
GFR calc Af Amer: 9 mL/min — ABNORMAL LOW (ref >60)
GFR calc non Af Amer: 8 mL/min — ABNORMAL LOW (ref >60)
Glucose, Bld: 119 mg/dL — ABNORMAL HIGH (ref 70–99)
Potassium: 3.7 mmol/L (ref 3.5–5.1)
Sodium: 138 mmol/L (ref 135–145)
Total Bilirubin: 0.7 mg/dL (ref 0.3–1.2)
Total Protein: 7 g/dL (ref 6.5–8.1)

## 2019-10-25 MED ORDER — SODIUM CHLORIDE 0.9% FLUSH: 3.0000 mL | Freq: Once | INTRAVENOUS | Status: DC

## 2019-10-25 NOTE — ED Triage Notes (Addendum)
Pt arrives to ED from home with complaints of his dialysis fistula in his left arm bleeding and swelling that started last night. Patient has been getting dialysis from an inserted port in his chest and only received 3/4 of his dialysis before they sent him to the ED. The fistula is clotted over at this time without significant bleeding noted.

## 2019-10-25 NOTE — ED Notes (Signed)
No answer for vitals  

## 2019-10-25 NOTE — Telephone Encounter (Signed)
Call from [place] at [facility]. 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 Currently in the chair getting HD treatment. She is asking for advice for appointment or send patient to ER at [facility]. Due to reported status of condition recommended to send patient to [facility] ER. Requested her to call ER triage with patient information.

## 2019-10-26 ENCOUNTER — Telehealth: Payer: Self-pay | Admitting: *Deleted

## 2019-10-26 ENCOUNTER — Other Ambulatory Visit: Payer: Self-pay

## 2019-10-26 ENCOUNTER — Emergency Department (HOSPITAL_COMMUNITY)
Admission: EM | Admit: 2019-10-26 | Discharge: 2019-10-26 | Disposition: A | Discharge status: Home / Self Care | Payer: Medicare Other | Source: Home / Self Care | Attending: Emergency Medicine | Admitting: Emergency Medicine

## 2019-10-26 DIAGNOSIS — I2510 Atherosclerotic heart disease of native coronary artery without angina pectoris: Secondary | ICD-10-CM | POA: Diagnosis not present

## 2019-10-26 DIAGNOSIS — Z03818 Encounter for observation for suspected exposure to other biological agents ruled out: Secondary | ICD-10-CM | POA: Diagnosis not present

## 2019-10-26 DIAGNOSIS — T82590A Other mechanical complication of surgically created arteriovenous fistula, initial encounter: Secondary | ICD-10-CM | POA: Diagnosis not present

## 2019-10-26 DIAGNOSIS — T829XXA Unspecified complication of cardiac and vascular prosthetic device, implant and graft, initial encounter: Secondary | ICD-10-CM

## 2019-10-26 DIAGNOSIS — T8249XA Other complication of vascular dialysis catheter, initial encounter: Secondary | ICD-10-CM | POA: Diagnosis not present

## 2019-10-26 DIAGNOSIS — I4891 Unspecified atrial fibrillation: Secondary | ICD-10-CM | POA: Diagnosis not present

## 2019-10-26 DIAGNOSIS — N186 End stage renal disease: Secondary | ICD-10-CM | POA: Diagnosis not present

## 2019-10-26 DIAGNOSIS — Z992 Dependence on renal dialysis: Secondary | ICD-10-CM | POA: Diagnosis not present

## 2019-10-26 LAB — CBC
HCT: 45.7 % (ref 39.0–52.0)
Hemoglobin: 14.8 g/dL (ref 13.0–17.0)
MCH: 31.2 pg (ref 26.0–34.0)
MCHC: 32.4 g/dL (ref 30.0–36.0)
MCV: 96.2 fL (ref 80.0–100.0)
Platelets: 265 10*3/uL (ref 150–400)
RBC: 4.75 MIL/uL (ref 4.22–5.81)
RDW: 14.5 % (ref 11.5–15.5)
WBC: 6.7 10*3/uL (ref 4.0–10.5)
nRBC: 0 % (ref 0.0–0.2)

## 2019-10-26 LAB — BASIC METABOLIC PANEL
Anion gap: 14 (ref 5–15)
BUN: 36 mg/dL — ABNORMAL HIGH (ref 8–23)
CO2: 27 mmol/L (ref 22–32)
Calcium: 10 mg/dL (ref 8.9–10.3)
Chloride: 99 mmol/L (ref 98–111)
Creatinine, Ser: 7.99 mg/dL — ABNORMAL HIGH (ref 0.61–1.24)
GFR calc Af Amer: 7 mL/min — ABNORMAL LOW (ref >60)
GFR calc non Af Amer: 6 mL/min — ABNORMAL LOW (ref >60)
Glucose, Bld: 86 mg/dL (ref 70–99)
Potassium: 4.2 mmol/L (ref 3.5–5.1)
Sodium: 140 mmol/L (ref 135–145)

## 2019-10-26 LAB — CBG MONITORING, ED: Glucose-Capillary: 76 mg/dL (ref 70–99)

## 2019-10-26 LAB — SARS CORONAVIRUS 2 BY RT PCR (HOSPITAL ORDER, PERFORMED IN ~~LOC~~ HOSPITAL LAB): SARS Coronavirus 2: NEGATIVE

## 2019-10-26 NOTE — ED Triage Notes (Signed)
Pt reports left arm fistula clotted. Here for evaluation. VSS. NAd at present. Seen here yesterday for same.

## 2019-10-26 NOTE — ED Notes (Signed)
Updated on wait for treatment room. 

## 2019-10-26 NOTE — H&P (View-Only) (Signed)
Vascular and Vein Specialist of [facility]  Patient name: Christian Dalton MRN: [id number] DOB: 1941-07-21 Sex: male   REQUESTING PROVIDER:    ER   REASON FOR CONSULT:    Dialysis access complication  HISTORY OF PRESENT ILLNESS:   Christian Dalton is a 78 y.o. male, who is on dialysis via a left arm fistula which occluded 3 weeks ago without options for recanalization.  He now has a catheter.  His last dialysis was yesterday.  He has had ongoing oozing from ulcerated aneurysms on his fistula for several days and so he was sent to the ER for ruther evaluation.  He has been getting abx at dialysis  PAST MEDICAL HISTORY    Past Medical History:  Diagnosis Date  . Acute cholecystitis 08/17/2018  . Atrial fibrillation ([place])   . CAD (coronary artery disease)   . Cholecystitis   . COPD (chronic obstructive pulmonary disease) ([place])   . Diabetes mellitus without complication ([place])   . ESRD (end stage renal disease) on dialysis ([place])   . Hypertension   . OSA (obstructive sleep apnea)   . Renal disorder   . Spinal stenosis      FAMILY HISTORY   Family History  Problem Relation Age of Onset  . Heart disease Mother   . Heart disease Father   . Heart disease Sister     SOCIAL HISTORY:   Social History   Socioeconomic History  . Marital status: Married    Spouse name: Di Kindle  . Number of children: Not on file  . Years of education: Not on file  . Highest education level: Not on file  Occupational History  . Not on file  Social Needs  . Financial resource strain: Not hard at all  . Food insecurity    Worry: Patient refused    Inability: Patient refused  . Transportation needs    Medical: Patient refused    Non-medical: Patient refused  Tobacco Use  . Smoking status: Never Smoker  . Smokeless tobacco: Former Network engineer and Sexual Activity  . Alcohol use: Not Currently  . Drug use: No  . Sexual activity: Not on file  Lifestyle  .  Physical activity    Days per week: Patient refused    Minutes per session: Patient refused  . Stress: Only a little  Relationships  . Social Herbalist on phone: Not on file    Gets together: Not on file    Attends religious service: Not on file    Active member of club or organization: Not on file    Attends meetings of clubs or organizations: Not on file    Relationship status: Not on file  . Intimate partner violence    Fear of current or ex partner: Not on file    Emotionally abused: Not on file    Physically abused: Not on file    Forced sexual activity: Not on file  Other Topics Concern  . Not on file  Social History Narrative  . Not on file    ALLERGIES:    Allergies  Allergen Reactions  . Avelox [Moxifloxacin Hcl] Other (See Comments)    Hallucinations   . Gabapentin Other (See Comments)    MD has suspended this because he's a dialysis patient  . Metoprolol Other (See Comments)    Dropped pulse too low; was taken off of this by MD  . Shellfish Allergy Nausea And Vomiting and Other (See Comments)  Made patient VERY nauseous and he developed stomach cramps  . Sulfa Antibiotics Other (See Comments)    Allergy is from childhood (reaction not recalled)  . Tetracyclines & Related Other (See Comments)    Blisters on hands and arms  . Adhesive [Tape] Itching and Rash    Paper tape only- skin cannot tolerate "heavy" tapes    CURRENT MEDICATIONS:    No current facility-administered medications for this encounter.    Current Outpatient Medications  Medication Sig Dispense Refill  . aspirin EC 81 MG tablet Take 81 mg by mouth at bedtime.     Marland Kitchen atorvastatin (LIPITOR) 10 MG tablet Take 10 mg by mouth at bedtime.    Marland Kitchen atorvastatin (LIPITOR) 20 MG tablet Take 1 tablet (20 mg total) by mouth every evening. (Patient not taking: Reported on 04/07/2019)    . buPROPion (WELLBUTRIN XL) 150 MG 24 hr tablet Take 150 mg by mouth at bedtime.     . capsaicin (ZOSTRIX)  0.025 % cream Apply 1 application topically 4 (four) times daily as needed (to legs, for pain).     Marland Kitchen docusate sodium (COLACE) 100 MG capsule Take 100 mg by mouth 2 (two) times daily.     Marland Kitchen escitalopram (LEXAPRO) 10 MG tablet Take 10 mg by mouth at bedtime.     . gabapentin (NEURONTIN) 300 MG capsule Take 300 mg by mouth at bedtime.    . insulin detemir (LEVEMIR) 100 UNIT/ML injection Inject 0.14 mLs (14 Units total) into the skin 2 (two) times daily. (Patient taking differently: Inject 16-20 Units into the skin See admin instructions. Inject 20 units into the skin before breakfast and 20 units before supper, per sliding scale) 10 mL 11  . Insulin Lispro Prot & Lispro (HUMALOG MIX 75/25 KWIKPEN) (75-25) 100 UNIT/ML Kwikpen Take 10 Units by mouth 2 (two) times daily.    Marland Kitchen lactulose, encephalopathy, (CHRONULAC) 10 GM/15ML SOLN Take 15 mLs by mouth daily before supper.   4  . levothyroxine (SYNTHROID, LEVOTHROID) 125 MCG tablet Take 125 mcg by mouth daily before breakfast.     . lidocaine-prilocaine (EMLA) cream Apply 1 application topically See admin instructions. To access site (AVF) 1-2 hours before dialysis. Cover with occlusive dressing (Saran warp)  4  . linaclotide (LINZESS) 290 MCG CAPS capsule Take 290 mcg by mouth daily before supper.     . loratadine (CLARITIN) 10 MG tablet Take 10 mg every evening by mouth.    . midodrine (PROAMATINE) 2.5 MG tablet Take 2.5 mg by mouth 3 (three) times daily.    . montelukast (SINGULAIR) 10 MG tablet Take 10 mg every evening by mouth.    . oxyCODONE-acetaminophen (PERCOCET) 10-325 MG tablet Take 1 tablet by mouth See admin instructions. Take 1 tablet by mouth one to four times a day as needed for pain    . polyethylene glycol powder (GLYCOLAX/MIRALAX) 17 GM/SCOOP powder Take 17 g by mouth daily before supper.    . sevelamer carbonate (RENVELA) 800 MG tablet Take 3 tablets (2,400 mg total) by mouth 3 (three) times daily with meals. (Patient taking differently:  Take 2,400 mg by mouth 2 (two) times daily with a meal. ) 45 tablet 0    REVIEW OF SYSTEMS:   [X]  denotes positive finding, [ ]  denotes negative finding Cardiac  Comments:  Chest pain or chest pressure:    Shortness of breath upon exertion:    Short of breath when lying flat:    Irregular heart rhythm:  Vascular    Pain in calf, thigh, or hip brought on by ambulation:    Pain in feet at night that wakes you up from your sleep:     Blood clot in your veins:    Leg swelling:         Pulmonary    Oxygen at home:    Productive cough:     Wheezing:         Neurologic    Sudden weakness in arms or legs:     Sudden numbness in arms or legs:     Sudden onset of difficulty speaking or slurred speech:    Temporary loss of vision in one eye:     Problems with dizziness:         Gastrointestinal    Blood in stool:      Vomited blood:         Genitourinary    Burning when urinating:     Blood in urine:        Psychiatric    Major depression:         Hematologic    Bleeding problems:    Problems with blood clotting too easily:        Skin    Rashes or ulcers:        Constitutional    Fever or chills:     PHYSICAL EXAM:   There were no vitals filed for this visit.  GENERAL: The patient is a well-nourished male, in no acute distress. The vital signs are documented above. CARDIAC: There is a regular rate and rhythm.  VASCULAR: thromboses left upper arm fistula with aneurysmal changes and ugly appearing ulcers with stigmata of recent bleeding PULMONARY: Nonlabored respirations ABDOMEN: Soft and non-tender with normal pitched bowel sounds.  MUSCULOSKELETAL: There are no major deformities or cyanosis. NEUROLOGIC: No focal weakness or paresthesias are detected. SKIN: see photo PSYCHIATRIC: The patient has a normal affect.    STUDIES:  none  ASSESSMENT and PLAN   We discussed resection of the ulcerated skin and underlying thrombosed aneurysms on his fistula  tomorrow.  His COVID test was done and he will return in the morning.   Leia Alf, MD, FACS [facility] and Vein Specialists of [facility] [phone] Pager [phone]

## 2019-10-26 NOTE — ED Provider Notes (Signed)
[facility] EMERGENCY DEPARTMENT Provider Note   CSN: [id number] Arrival date & time: 10/26/19  1123     History   Chief Complaint Chief Complaint  Patient presents with  . Vascular Access Problem    HPI Christian Dalton is a 78 y.o. male.     HPI Patient sent in for evaluation of his left upper extremity dialysis fistula.  He has a functioning right chest wall catheter placed by Dr. Bridgett Larsson vascular surgery.  He is dialyzed through this and was dialyzed yesterday.  However his left fistula has been bleeding.  Sent in for evaluation.  Went yesterday to the ER after being told to come in by dialysis but the wait was too long.  Supposed to follow-up with vascular surgery with for soon appointment but told to come in since more bleeding.  Not on blood thinners.  No lightheadedness or dizziness.  Ate breakfast but does not eat much today. Past Medical History:  Diagnosis Date  . Acute cholecystitis 08/17/2018  . Atrial fibrillation ([place])   . CAD (coronary artery disease)   . Cholecystitis   . COPD (chronic obstructive pulmonary disease) ([place])   . Diabetes mellitus without complication ([place])   . ESRD (end stage renal disease) on dialysis ([place])   . Hypertension   . OSA (obstructive sleep apnea)   . Renal disorder   . Spinal stenosis     Patient Active Problem List   Diagnosis Date Noted  . Nausea 04/07/2019  . Elevated troponin 04/07/2019  . HLD (hyperlipidemia) 04/07/2019  . Hypothyroidism 04/07/2019  . Depression 04/07/2019  . Type 2 diabetes mellitus with hemoglobin A1c goal of less than 7.0% (HCC)   . Bile leak, postoperative 11/23/2018  . Choledocholithiasis 11/11/2018  . ESRD (end stage renal disease) on dialysis ([place]) 08/17/2018  . Type II diabetes mellitus with renal manifestations ([place]) 08/17/2018  . HTN (hypertension) 08/17/2018  . CAD (coronary artery disease) 08/17/2018  . Obesity, Class III, BMI 40-49.9 (morbid obesity) ([place]) 08/17/2018    Past  Surgical History:  Procedure Laterality Date  . BILIARY STENT PLACEMENT  11/25/2018   Procedure: BILIARY STENT PLACEMENT;  Surgeon: [name], MD;  Location: [facility] ENDOSCOPY;  Service: Gastroenterology;;  . BIOPSY  12/20/2018   Procedure: BIOPSY;  Surgeon: [name], MD;  Location: [facility] ENDOSCOPY;  Service: Gastroenterology;;  . CHOLECYSTECTOMY N/A 11/14/2018   Procedure: LAPAROSCOPIC CHOLECYSTECTOMY WITH INTRAOPERATIVE CHOLANGIOGRAM;  Surgeon: Coralie Keens, MD;  Location: [facility];  Service: General;  Laterality: N/A;  . ERCP N/A 11/25/2018   Procedure: ENDOSCOPIC RETROGRADE CHOLANGIOPANCREATOGRAPHY (ERCP);  Surgeon: [name], MD;  Location: [facility];  Service: Gastroenterology;  Laterality: N/A;  . ESOPHAGOGASTRODUODENOSCOPY N/A 12/20/2018   Procedure: ESOPHAGOGASTRODUODENOSCOPY (EGD);  Surgeon: [name], MD;  Location: [facility] ENDOSCOPY;  Service: Gastroenterology;  Laterality: N/A;  . HAND SURGERY Right   . IR DIALY SHUNT INTRO NEEDLE/INTRACATH INITIAL W/IMG LEFT Left 08/20/2018  . IR PERC CHOLECYSTOSTOMY  08/19/2018  . IR RADIOLOGIST EVAL & MGMT  09/29/2018  . SHOULDER SURGERY Right   . SPHINCTEROTOMY  11/25/2018   Procedure: SPHINCTEROTOMY;  Surgeon: [name], MD;  Location: [facility] ENDOSCOPY;  Service: Gastroenterology;;  . Lavell Islam REMOVAL  12/20/2018   Procedure: STENT REMOVAL;  Surgeon: [name], MD;  Location: [facility] ENDOSCOPY;  Service: Gastroenterology;;  . [place] Medications    Prior to Admission medications   Medication Sig Start Date End Date Taking? Authorizing Provider  aspirin EC 81 MG tablet Take 81 mg by mouth at bedtime.     [provider]  atorvastatin (LIPITOR) 10 MG tablet Take 10 mg by mouth at bedtime.    [provider]  atorvastatin (LIPITOR) 20 MG tablet Take 1 tablet (20 mg total) by mouth every evening. Patient not taking: Reported on 04/07/2019 08/24/18   Elgergawy, Silver Huguenin, MD  buPROPion (WELLBUTRIN XL) 150 MG 24  hr tablet Take 150 mg by mouth at bedtime.     [provider]  capsaicin (ZOSTRIX) 0.025 % cream Apply 1 application topically 4 (four) times daily as needed (to legs, for pain).  03/29/19   [provider]  docusate sodium (COLACE) 100 MG capsule Take 100 mg by mouth 2 (two) times daily.     [provider]  escitalopram (LEXAPRO) 10 MG tablet Take 10 mg by mouth at bedtime.  08/30/15   [provider]  gabapentin (NEURONTIN) 300 MG capsule Take 300 mg by mouth at bedtime.    [provider]  insulin detemir (LEVEMIR) 100 UNIT/ML injection Inject 0.14 mLs (14 Units total) into the skin 2 (two) times daily. Patient taking differently: Inject 16-20 Units into the skin See admin instructions. Inject 20 units into the skin before breakfast and 20 units before supper, per sliding scale 08/24/18   Elgergawy, Silver Huguenin, MD  Insulin Lispro Prot & Lispro (HUMALOG MIX 75/25 KWIKPEN) (75-25) 100 UNIT/ML Kwikpen Take 10 Units by mouth 2 (two) times daily. 07/12/18   [provider]  lactulose, encephalopathy, (CHRONULAC) 10 GM/15ML SOLN Take 15 mLs by mouth daily before supper.  08/02/18   [provider]  levothyroxine (SYNTHROID, LEVOTHROID) 125 MCG tablet Take 125 mcg by mouth daily before breakfast.     [provider]  lidocaine-prilocaine (EMLA) cream Apply 1 application topically See admin instructions. To access site (AVF) 1-2 hours before dialysis. Cover with occlusive dressing (Saran warp) 08/02/18   [provider]  linaclotide (LINZESS) 290 MCG CAPS capsule Take 290 mcg by mouth daily before supper.     [provider]  loratadine (CLARITIN) 10 MG tablet Take 10 mg every evening by mouth.    [provider]  midodrine (PROAMATINE) 2.5 MG tablet Take 2.5 mg by mouth 3 (three) times daily. 09/20/18   [provider]  montelukast (SINGULAIR) 10 MG tablet Take 10 mg every evening by mouth.    [provider]  oxyCODONE-acetaminophen (PERCOCET) 10-325 MG tablet Take 1 tablet by mouth See admin instructions. Take 1 tablet by mouth one to four times a day as needed for pain 12/09/18   [provider]  polyethylene glycol powder (GLYCOLAX/MIRALAX) 17 GM/SCOOP powder Take 17 g by mouth daily before supper.    [provider]  sevelamer carbonate (RENVELA) 800 MG tablet Take 3 tablets (2,400 mg total) by mouth 3 (three) times daily with meals. Patient taking differently: Take 2,400 mg by mouth 2 (two) times daily with a meal.  08/24/18   Elgergawy, Silver Huguenin, MD    Family History Family History  Problem Relation Age of Onset  . Heart disease Mother   . Heart disease Father   . Heart disease Sister     Social History Social History   Tobacco Use  . Smoking status: Never Smoker  . Smokeless tobacco: Former Network engineer Use Topics  . Alcohol use: Not Currently  . Drug use: No     Allergies   Avelox [moxifloxacin  hcl], Gabapentin, Metoprolol, Shellfish allergy, Sulfa antibiotics, Tetracyclines & related, and Adhesive [tape]   Review of Systems Review of Systems  Constitutional: Negative for appetite change.  HENT: Negative for congestion.   Respiratory: Negative for shortness of breath.   Cardiovascular: Negative for chest pain.  Gastrointestinal: Negative for abdominal pain.  Genitourinary: Negative for flank pain.  Neurological: Negative for weakness.  Psychiatric/Behavioral: Negative for confusion.     Physical Exam Updated Vital Signs BP 99/69 (BP Location: Right Arm)   Pulse 77   Temp 98.6 F (37 C) (Oral)   Resp 18   Ht 5\' 10"  (1.778 m)   Wt 95.7 kg   SpO2 99%   BMI 30.28 kg/m   Physical Exam Vitals signs and nursing note reviewed.  HENT:     Head: Atraumatic.  Eyes:     Extraocular Movements: Extraocular movements intact.  Neck:     Musculoskeletal: Neck supple.  Cardiovascular:     Rate and Rhythm: Regular rhythm.   Pulmonary:     Breath sounds: Normal breath sounds.     Comments: Dialysis catheter right chest wall. Abdominal:     Tenderness: There is no abdominal tenderness.  Musculoskeletal:     Comments: Dialysis fistula to left upper extremity.  With the proximal and the distal areas have swollen areas that look like blood blisters.  There is oozing of blood from the distal one.  Pulses intact inwrist.  Skin:    General: Skin is warm.     Capillary Refill: Capillary refill takes less than 2 seconds.  Neurological:     Mental Status: He is alert and oriented to person, place, and time.        ED Treatments / Results  Labs (all labs ordered are listed, but only abnormal results are displayed) Labs Reviewed  BASIC METABOLIC PANEL - Abnormal; Notable for the following components:      Result Value   BUN 36 (*)    Creatinine, Ser 7.99 (*)    GFR calc non Af Amer 6 (*)    GFR calc Af Amer 7 (*)    All other components within normal limits  SARS CORONAVIRUS 2 BY RT PCR (HOSPITAL ORDER, [facility])  CBC  CBG MONITORING, ED    EKG None  Radiology No results found.  Procedures Procedures (including critical care time)  Medications Ordered in ED Medications - No data to display   Initial Impression / Assessment and Plan / ED Course  I have reviewed the triage vital signs and the nursing notes.  Pertinent labs & imaging results that were available during my care of the patient were reviewed by me and considered in my medical decision making (see chart for details).       Patient with bleeding aneurysms from his nonfunctioning dialysis fistula.  Seen by Dr. Trula Slade from vascular surgery.  Will take to the OR tomorrow but will go home tonight.  Lab work and [employer] testing been done.  Discharge home.  Final Clinical Impressions(s) / ED Diagnoses   Final diagnoses:  Complication of vascular access for dialysis, initial encounter    ED Discharge Orders     None       Davonna Belling, MD 10/26/19 1715

## 2019-10-26 NOTE — Consult Note (Signed)
[facility]  Patient name: Christian Dalton MRN: [id number] DOB: 06/01/1941 Sex: male   REQUESTING PROVIDER:    ER   REASON FOR CONSULT:    Dialysis access complication  HISTORY OF PRESENT ILLNESS:   Christian Dalton is a 78 y.o. male, who is on dialysis via a left arm fistula which occluded 3 weeks ago without options for recanalization.  He now has a catheter.  His last dialysis was yesterday.  He has had ongoing oozing from ulcerated aneurysms on his fistula for several days and so he was sent to the ER for ruther evaluation.  He has been getting abx at dialysis  PAST MEDICAL HISTORY    Past Medical History:  Diagnosis Date  . Acute cholecystitis 08/17/2018  . Atrial fibrillation ([place])   . CAD (coronary artery disease)   . Cholecystitis   . COPD (chronic obstructive pulmonary disease) ([place])   . Diabetes mellitus without complication ([place])   . ESRD (end stage renal disease) on dialysis ([place])   . Hypertension   . OSA (obstructive sleep apnea)   . Renal disorder   . Spinal stenosis      FAMILY HISTORY   Family History  Problem Relation Age of Onset  . Heart disease Mother   . Heart disease Father   . Heart disease Sister     SOCIAL HISTORY:   Social History   Socioeconomic History  . Marital status: Married    Spouse name: Christian Dalton  . Number of children: Not on file  . Years of education: Not on file  . Highest education level: Not on file  Occupational History  . Not on file  Social Needs  . Financial resource strain: Not hard at all  . Food insecurity    Worry: Patient refused    Inability: Patient refused  . Transportation needs    Medical: Patient refused    Non-medical: Patient refused  Tobacco Use  . Smoking status: Never Smoker  . Smokeless tobacco: Former Network engineer and Sexual Activity  . Alcohol use: Not Currently  . Drug use: No  . Sexual activity: Not on file  Lifestyle  .  Physical activity    Days per week: Patient refused    Minutes per session: Patient refused  . Stress: Only a little  Relationships  . Social Herbalist on phone: Not on file    Gets together: Not on file    Attends religious service: Not on file    Active member of club or organization: Not on file    Attends meetings of clubs or organizations: Not on file    Relationship status: Not on file  . Intimate partner violence    Fear of current or ex partner: Not on file    Emotionally abused: Not on file    Physically abused: Not on file    Forced sexual activity: Not on file  Other Topics Concern  . Not on file  Social History Narrative  . Not on file    ALLERGIES:    Allergies  Allergen Reactions  . Avelox [Moxifloxacin Hcl] Other (See Comments)    Hallucinations   . Gabapentin Other (See Comments)    MD has suspended this because he's a dialysis patient  . Metoprolol Other (See Comments)    Dropped pulse too low; was taken off of this by MD  . Shellfish Allergy Nausea And Vomiting and Other (See Comments)  Made patient VERY nauseous and he developed stomach cramps  . Sulfa Antibiotics Other (See Comments)    Allergy is from childhood (reaction not recalled)  . Tetracyclines & Related Other (See Comments)    Blisters on hands and arms  . Adhesive [Tape] Itching and Rash    Paper tape only- skin cannot tolerate "heavy" tapes    CURRENT MEDICATIONS:    No current facility-administered medications for this encounter.    Current Outpatient Medications  Medication Sig Dispense Refill  . aspirin EC 81 MG tablet Take 81 mg by mouth at bedtime.     Marland Kitchen atorvastatin (LIPITOR) 10 MG tablet Take 10 mg by mouth at bedtime.    Marland Kitchen atorvastatin (LIPITOR) 20 MG tablet Take 1 tablet (20 mg total) by mouth every evening. (Patient not taking: Reported on 04/07/2019)    . buPROPion (WELLBUTRIN XL) 150 MG 24 hr tablet Take 150 mg by mouth at bedtime.     . capsaicin (ZOSTRIX)  0.025 % cream Apply 1 application topically 4 (four) times daily as needed (to legs, for pain).     Marland Kitchen docusate sodium (COLACE) 100 MG capsule Take 100 mg by mouth 2 (two) times daily.     Marland Kitchen escitalopram (LEXAPRO) 10 MG tablet Take 10 mg by mouth at bedtime.     . gabapentin (NEURONTIN) 300 MG capsule Take 300 mg by mouth at bedtime.    . insulin detemir (LEVEMIR) 100 UNIT/ML injection Inject 0.14 mLs (14 Units total) into the skin 2 (two) times daily. (Patient taking differently: Inject 16-20 Units into the skin See admin instructions. Inject 20 units into the skin before breakfast and 20 units before supper, per sliding scale) 10 mL 11  . Insulin Lispro Prot & Lispro (HUMALOG MIX 75/25 KWIKPEN) (75-25) 100 UNIT/ML Kwikpen Take 10 Units by mouth 2 (two) times daily.    Marland Kitchen lactulose, encephalopathy, (CHRONULAC) 10 GM/15ML SOLN Take 15 mLs by mouth daily before supper.   4  . levothyroxine (SYNTHROID, LEVOTHROID) 125 MCG tablet Take 125 mcg by mouth daily before breakfast.     . lidocaine-prilocaine (EMLA) cream Apply 1 application topically See admin instructions. To access site (AVF) 1-2 hours before dialysis. Cover with occlusive dressing (Saran warp)  4  . linaclotide (LINZESS) 290 MCG CAPS capsule Take 290 mcg by mouth daily before supper.     . loratadine (CLARITIN) 10 MG tablet Take 10 mg every evening by mouth.    . midodrine (PROAMATINE) 2.5 MG tablet Take 2.5 mg by mouth 3 (three) times daily.    . montelukast (SINGULAIR) 10 MG tablet Take 10 mg every evening by mouth.    . oxyCODONE-acetaminophen (PERCOCET) 10-325 MG tablet Take 1 tablet by mouth See admin instructions. Take 1 tablet by mouth one to four times a day as needed for pain    . polyethylene glycol powder (GLYCOLAX/MIRALAX) 17 GM/SCOOP powder Take 17 g by mouth daily before supper.    . sevelamer carbonate (RENVELA) 800 MG tablet Take 3 tablets (2,400 mg total) by mouth 3 (three) times daily with meals. (Patient taking differently:  Take 2,400 mg by mouth 2 (two) times daily with a meal. ) 45 tablet 0    REVIEW OF SYSTEMS:   [X]  denotes positive finding, [ ]  denotes negative finding Cardiac  Comments:  Chest pain or chest pressure:    Shortness of breath upon exertion:    Short of breath when lying flat:    Irregular heart rhythm:  Vascular    Pain in calf, thigh, or hip brought on by ambulation:    Pain in feet at night that wakes you up from your sleep:     Blood clot in your veins:    Leg swelling:         Pulmonary    Oxygen at home:    Productive cough:     Wheezing:         Neurologic    Sudden weakness in arms or legs:     Sudden numbness in arms or legs:     Sudden onset of difficulty speaking or slurred speech:    Temporary loss of vision in one eye:     Problems with dizziness:         Gastrointestinal    Blood in stool:      Vomited blood:         Genitourinary    Burning when urinating:     Blood in urine:        Psychiatric    Major depression:         Hematologic    Bleeding problems:    Problems with blood clotting too easily:        Skin    Rashes or ulcers:        Constitutional    Fever or chills:     PHYSICAL EXAM:   There were no vitals filed for this visit.  GENERAL: The patient is a well-nourished male, in no acute distress. The vital signs are documented above. CARDIAC: There is a regular rate and rhythm.  VASCULAR: thromboses left upper arm fistula with aneurysmal changes and ugly appearing ulcers with stigmata of recent bleeding PULMONARY: Nonlabored respirations ABDOMEN: Soft and non-tender with normal pitched bowel sounds.  MUSCULOSKELETAL: There are no major deformities or cyanosis. NEUROLOGIC: No focal weakness or paresthesias are detected. SKIN: see photo PSYCHIATRIC: The patient has a normal affect.    STUDIES:  none  ASSESSMENT and PLAN   We discussed resection of the ulcerated skin and underlying thrombosed aneurysms on his fistula  tomorrow.  His COVID test was done and he will return in the morning.   Leia Alf, MD, FACS [facility] [phone] Pager [phone]

## 2019-10-26 NOTE — Discharge Instructions (Addendum)
Follow-up tomorrow as planned for the operation.

## 2019-10-26 NOTE — ED Notes (Signed)
Got patient into a gown on the monitor patient is resting with call bell in reach 

## 2019-10-26 NOTE — ED Notes (Signed)
Pt still does not respond when called for vitals check

## 2019-10-26 NOTE — Telephone Encounter (Signed)
Patient has functioning TDC. Left message with patient after he left without being seen last pm at [facility] ER. His wife called answering service wanting to schedule appointment. Informed patient that URGENT referral from [facility] will be reviewed and appt. Scheduled with [facility] doctor at first available. However, due to condition of his arm, bleeding as reported by Caryl Pina at [facility] is to go to [facility] ER if any worsening condition.

## 2019-10-26 NOTE — ED Notes (Signed)
Pt did not respond when called for vitals check 

## 2019-10-27 ENCOUNTER — Encounter (HOSPITAL_COMMUNITY): Payer: Self-pay

## 2019-10-27 ENCOUNTER — Encounter (HOSPITAL_COMMUNITY)
Admission: RE | Disposition: A | Discharge status: Home / Self Care | Payer: Self-pay | Source: Home / Self Care | Attending: Surgery

## 2019-10-27 ENCOUNTER — Other Ambulatory Visit: Payer: Self-pay

## 2019-10-27 ENCOUNTER — Ambulatory Visit (HOSPITAL_COMMUNITY): Payer: Medicare Other | Admitting: Certified Registered Nurse Anesthetist

## 2019-10-27 ENCOUNTER — Ambulatory Visit (HOSPITAL_COMMUNITY)
Admission: RE | Admit: 2019-10-27 | Discharge: 2019-10-27 | Disposition: A | Discharge status: Home / Self Care | Payer: Medicare Other | Attending: Surgery | Admitting: Surgery

## 2019-10-27 DIAGNOSIS — J449 Chronic obstructive pulmonary disease, unspecified: Secondary | ICD-10-CM | POA: Insufficient documentation

## 2019-10-27 DIAGNOSIS — I120 Hypertensive chronic kidney disease with stage 5 chronic kidney disease or end stage renal disease: Secondary | ICD-10-CM | POA: Insufficient documentation

## 2019-10-27 DIAGNOSIS — I2510 Atherosclerotic heart disease of native coronary artery without angina pectoris: Secondary | ICD-10-CM | POA: Diagnosis not present

## 2019-10-27 DIAGNOSIS — G4733 Obstructive sleep apnea (adult) (pediatric): Secondary | ICD-10-CM | POA: Diagnosis not present

## 2019-10-27 DIAGNOSIS — Z79899 Other long term (current) drug therapy: Secondary | ICD-10-CM | POA: Insufficient documentation

## 2019-10-27 DIAGNOSIS — Y832 Surgical operation with anastomosis, bypass or graft as the cause of abnormal reaction of the patient, or of later complication, without mention of misadventure at the time of the procedure: Secondary | ICD-10-CM | POA: Diagnosis not present

## 2019-10-27 DIAGNOSIS — Z992 Dependence on renal dialysis: Secondary | ICD-10-CM | POA: Diagnosis not present

## 2019-10-27 DIAGNOSIS — T82898A Other specified complication of vascular prosthetic devices, implants and grafts, initial encounter: Secondary | ICD-10-CM | POA: Diagnosis not present

## 2019-10-27 DIAGNOSIS — Z79890 Hormone replacement therapy (postmenopausal): Secondary | ICD-10-CM | POA: Diagnosis not present

## 2019-10-27 DIAGNOSIS — E1122 Type 2 diabetes mellitus with diabetic chronic kidney disease: Secondary | ICD-10-CM | POA: Insufficient documentation

## 2019-10-27 DIAGNOSIS — F329 Major depressive disorder, single episode, unspecified: Secondary | ICD-10-CM | POA: Insufficient documentation

## 2019-10-27 DIAGNOSIS — N186 End stage renal disease: Secondary | ICD-10-CM

## 2019-10-27 DIAGNOSIS — T82868A Thrombosis of vascular prosthetic devices, implants and grafts, initial encounter: Secondary | ICD-10-CM | POA: Insufficient documentation

## 2019-10-27 DIAGNOSIS — Z794 Long term (current) use of insulin: Secondary | ICD-10-CM | POA: Insufficient documentation

## 2019-10-27 HISTORY — DX: Depression, unspecified: F32.A

## 2019-10-27 HISTORY — PX: RESECTION OF ARTERIOVENOUS FISTULA ANEURYSM: SHX6070

## 2019-10-27 LAB — POCT I-STAT, CHEM 8
BUN: 62 mg/dL — ABNORMAL HIGH (ref 8–23)
Calcium, Ion: 0.95 mmol/L — ABNORMAL LOW (ref 1.15–1.40)
Chloride: 106 mmol/L (ref 98–111)
Creatinine, Ser: 9.7 mg/dL — ABNORMAL HIGH (ref 0.61–1.24)
Glucose, Bld: 155 mg/dL — ABNORMAL HIGH (ref 70–99)
HCT: 44 % (ref 39.0–52.0)
Hemoglobin: 15 g/dL (ref 13.0–17.0)
Potassium: 4.7 mmol/L (ref 3.5–5.1)
Sodium: 135 mmol/L (ref 135–145)
TCO2: 26 mmol/L (ref 22–32)

## 2019-10-27 LAB — GLUCOSE, CAPILLARY
Glucose-Capillary: 141 mg/dL — ABNORMAL HIGH (ref 70–99)
Glucose-Capillary: 123 mg/dL — ABNORMAL HIGH (ref 70–99)
Glucose-Capillary: 121 mg/dL — ABNORMAL HIGH (ref 70–99)

## 2019-10-27 SURGERY — RESECTION OF ARTERIOVENOUS FISTULA ANEURYSM
Anesthesia: General | Laterality: Left

## 2019-10-27 MED ORDER — PROPOFOL 10 MG/ML IV BOLUS
INTRAVENOUS | Status: DC | PRN
  Administered 2019-10-27: 10 mg via INTRAVENOUS
  Administered 2019-10-27: 120 mg via INTRAVENOUS

## 2019-10-27 MED ORDER — OXYCODONE HCL 5 MG PO TABS
ORAL_TABLET | ORAL | Status: AC
  Filled 2019-10-27: qty 1

## 2019-10-27 MED ORDER — ONDANSETRON HCL 4 MG/2ML IJ SOLN: 4.0000 mg | Freq: Four times a day (QID) | INTRAMUSCULAR | Status: DC | PRN

## 2019-10-27 MED ORDER — GLYCOPYRROLATE PF 0.2 MG/ML IJ SOSY
PREFILLED_SYRINGE | INTRAMUSCULAR | Status: AC
  Filled 2019-10-27: qty 1

## 2019-10-27 MED ORDER — EPHEDRINE 5 MG/ML INJ
INTRAVENOUS | Status: AC
  Filled 2019-10-27: qty 10

## 2019-10-27 MED ORDER — ONDANSETRON HCL 4 MG/2ML IJ SOLN
INTRAMUSCULAR | Status: DC | PRN
  Administered 2019-10-27: 4 mg via INTRAVENOUS

## 2019-10-27 MED ORDER — OXYCODONE HCL 5 MG PO TABS
5.0000 mg | ORAL_TABLET | Freq: Once | ORAL | Status: AC | PRN
  Administered 2019-10-27: 5 mg via ORAL

## 2019-10-27 MED ORDER — CEFAZOLIN SODIUM 1 G IJ SOLR
INTRAMUSCULAR | Status: AC
  Filled 2019-10-27: qty 20

## 2019-10-27 MED ORDER — ARTIFICIAL TEARS OPHTHALMIC OINT
TOPICAL_OINTMENT | OPHTHALMIC | Status: AC
  Filled 2019-10-27: qty 3.5

## 2019-10-27 MED ORDER — LIDOCAINE 2% (20 MG/ML) 5 ML SYRINGE
INTRAMUSCULAR | Status: AC
  Filled 2019-10-27: qty 5

## 2019-10-27 MED ORDER — SODIUM CHLORIDE 0.9 % IV SOLN
INTRAVENOUS | Status: DC | PRN
  Administered 2019-10-27: 500 mL

## 2019-10-27 MED ORDER — 0.9 % SODIUM CHLORIDE (POUR BTL) OPTIME
TOPICAL | Status: DC | PRN
  Administered 2019-10-27: 10:00:00 1000 mL

## 2019-10-27 MED ORDER — DIPHENHYDRAMINE HCL 50 MG/ML IJ SOLN
INTRAMUSCULAR | Status: AC
  Filled 2019-10-27: qty 1

## 2019-10-27 MED ORDER — LIDOCAINE 2% (20 MG/ML) 5 ML SYRINGE
INTRAMUSCULAR | Status: DC | PRN
  Administered 2019-10-27: 60 mg via INTRAVENOUS

## 2019-10-27 MED ORDER — CEFAZOLIN SODIUM-DEXTROSE 2-3 GM-%(50ML) IV SOLR
INTRAVENOUS | Status: DC | PRN
  Administered 2019-10-27: 2 g via INTRAVENOUS

## 2019-10-27 MED ORDER — FENTANYL CITRATE (PF) 250 MCG/5ML IJ SOLN
INTRAMUSCULAR | Status: AC
  Filled 2019-10-27: qty 5

## 2019-10-27 MED ORDER — SODIUM CHLORIDE 0.9 % IV SOLN
INTRAVENOUS | Status: AC
  Filled 2019-10-27: qty 1.2

## 2019-10-27 MED ORDER — PHENYLEPHRINE 40 MCG/ML (10ML) SYRINGE FOR IV PUSH (FOR BLOOD PRESSURE SUPPORT)
PREFILLED_SYRINGE | INTRAVENOUS | Status: DC | PRN
  Administered 2019-10-27 (×3): 80 ug via INTRAVENOUS
  Administered 2019-10-27: 120 ug via INTRAVENOUS

## 2019-10-27 MED ORDER — FENTANYL CITRATE (PF) 100 MCG/2ML IJ SOLN: 25.0000 ug | INTRAMUSCULAR | Status: DC | PRN

## 2019-10-27 MED ORDER — PROPOFOL 10 MG/ML IV BOLUS
INTRAVENOUS | Status: AC
  Filled 2019-10-27: qty 20

## 2019-10-27 MED ORDER — PHENYLEPHRINE 40 MCG/ML (10ML) SYRINGE FOR IV PUSH (FOR BLOOD PRESSURE SUPPORT)
PREFILLED_SYRINGE | INTRAVENOUS | Status: AC
  Filled 2019-10-27: qty 10

## 2019-10-27 MED ORDER — FENTANYL CITRATE (PF) 100 MCG/2ML IJ SOLN
INTRAMUSCULAR | Status: DC | PRN
  Administered 2019-10-27: 50 ug via INTRAVENOUS
  Administered 2019-10-27 (×2): 25 ug via INTRAVENOUS

## 2019-10-27 MED ORDER — DIPHENHYDRAMINE HCL 50 MG/ML IJ SOLN
INTRAMUSCULAR | Status: DC | PRN
  Administered 2019-10-27: 10 mg via INTRAVENOUS

## 2019-10-27 MED ORDER — OXYCODONE HCL 5 MG/5ML PO SOLN: 5.0000 mg | Freq: Once | ORAL | Status: AC | PRN

## 2019-10-27 MED ORDER — SODIUM CHLORIDE 0.9 % IV SOLN
INTRAVENOUS | Status: DC
  Administered 2019-10-27: 07:00:00 10 mL/h via INTRAVENOUS

## 2019-10-27 SURGICAL SUPPLY — 34 items
ARMBAND PINK RESTRICT EXTREMIT (MISCELLANEOUS) ×2 IMPLANT
BNDG ELASTIC 4X5.8 VLCR STR LF (GAUZE/BANDAGES/DRESSINGS) ×2 IMPLANT
CANISTER SUCT 3000ML PPV (MISCELLANEOUS) ×2 IMPLANT
CLIP VESOCCLUDE MED 6/CT (CLIP) ×2 IMPLANT
CLIP VESOCCLUDE SM WIDE 6/CT (CLIP) ×2 IMPLANT
COVER PROBE W GEL 5X96 (DRAPES) IMPLANT
COVER WAND RF STERILE (DRAPES) IMPLANT
DERMABOND ADVANCED (GAUZE/BANDAGES/DRESSINGS) ×1
DERMABOND ADVANCED .7 DNX12 (GAUZE/BANDAGES/DRESSINGS) ×1 IMPLANT
ELECT CAUTERY BLADE 6.4 (BLADE) ×2 IMPLANT
ELECT REM PT RETURN 9FT ADLT (ELECTROSURGICAL) ×2
ELECTRODE REM PT RTRN 9FT ADLT (ELECTROSURGICAL) ×1 IMPLANT
GAUZE SPONGE 4X4 12PLY STRL LF (GAUZE/BANDAGES/DRESSINGS) ×2 IMPLANT
GLOVE BIOGEL PI IND STRL 7.5 (GLOVE) ×2 IMPLANT
GLOVE BIOGEL PI INDICATOR 7.5 (GLOVE) ×2
GLOVE SURG SS PI 7.5 STRL IVOR (GLOVE) ×2 IMPLANT
GOWN STRL REUS W/ TWL LRG LVL3 (GOWN DISPOSABLE) ×2 IMPLANT
GOWN STRL REUS W/ TWL XL LVL3 (GOWN DISPOSABLE) ×1 IMPLANT
GOWN STRL REUS W/TWL LRG LVL3 (GOWN DISPOSABLE) ×2
GOWN STRL REUS W/TWL XL LVL3 (GOWN DISPOSABLE) ×1
HEMOSTAT SNOW SURGICEL 2X4 (HEMOSTASIS) IMPLANT
KIT BASIN OR (CUSTOM PROCEDURE TRAY) ×2 IMPLANT
KIT TURNOVER KIT B (KITS) ×2 IMPLANT
NS IRRIG 1000ML POUR BTL (IV SOLUTION) ×2 IMPLANT
PACK CV ACCESS (CUSTOM PROCEDURE TRAY) ×2 IMPLANT
PAD ARMBOARD 7.5X6 YLW CONV (MISCELLANEOUS) ×4 IMPLANT
PENCIL BUTTON HOLSTER BLD 10FT (ELECTRODE) ×2 IMPLANT
SUT PROLENE 6 0 CC (SUTURE) ×2 IMPLANT
SUT VIC AB 3-0 SH 27 (SUTURE) ×2
SUT VIC AB 3-0 SH 27X BRD (SUTURE) ×2 IMPLANT
SUT VICRYL 4-0 PS2 18IN ABS (SUTURE) ×4 IMPLANT
TOWEL GREEN STERILE (TOWEL DISPOSABLE) ×2 IMPLANT
UNDERPAD 30X30 (UNDERPADS AND DIAPERS) ×2 IMPLANT
WATER STERILE IRR 1000ML POUR (IV SOLUTION) ×2 IMPLANT

## 2019-10-27 NOTE — Op Note (Signed)
    Patient name: Christian Dalton MRN: [id number] DOB: 12/02/1940 Sex: male  10/27/2019 Pre-operative Diagnosis: ESRD Post-operative diagnosis:  Same Surgeon:  [name] Assistants:  Ellsworth Lennox Procedure:   Resection of left arm fistula aneurysm x2 Anesthesia: General Blood Loss: Minimal Specimens: None  Findings: Aneurysms were resected in entirety.  The fistula was thrombosed.  Indications: The patient has a left brachiocephalic fistula was occluded 3 weeks ago.  Revascularization was not recommended and a catheter was placed.  He has 2 large aneurysmal areas on his arm that has been oozing despite fistula occlusion.  We discussed removal of the aneurysms and repair of the ulcerated skin.  Procedure:  The patient was identified in the holding area and taken to [place] [facility]  The patient was then placed supine on the table. general anesthesia was administered.  The patient was prepped and draped in the usual sterile fashion.  A time out was called and antibiotics were administered.  An elliptical incision was made around each of the aneurysmal segments of his fistula, including both of the ulcerated skin areas.  Using cautery, I dissected out the aneurysm in both locations.  Once the aneurysms were fully isolated, they were ligated proximally distally with silk ties and removed.  The subcutaneous tissue was irrigated and hemostasis was achieved.  The deep tissue was reapproximated with 3-0 Vicryl.  The subcutaneous tissue was closed with 3-0 Vicryl, and the skin was closed with 4-0 Vicryl followed by Dermabond.  A Ace wrap compression was placed on the upper arm.  There were no immediate complications.   Disposition: To PACU stable   V. [name], M.D., [facility] [place] Office: [phone] Pager:  [phone]

## 2019-10-27 NOTE — Interval H&P Note (Signed)
History and Physical Interval Note:  10/27/2019 9:21 AM  Christian Dalton  has presented today for surgery, with the diagnosis of FISTULA BLEEDING.  The various methods of treatment have been discussed with the patient and family. After consideration of risks, benefits and other options for treatment, the patient has consented to  Procedure(s): REVISiON OF ARTERIOVENOUS FISTULA LEFT ARM (Left) as a surgical intervention.  The patient's history has been reviewed, patient examined, no change in status, stable for surgery.  I have reviewed the patient's chart and labs.  Questions were answered to the patient's satisfaction.     [name]

## 2019-10-27 NOTE — Transfer of Care (Signed)
Immediate Anesthesia Transfer of Care Note  Patient: Christian Dalton  Procedure(s) Performed: Resection Of Left Upper Arm  Arteriovenous Fistula Aneurysm Times Two (Left )  Patient Location: PACU  Anesthesia Type:General  Level of Consciousness: awake, patient cooperative and responds to stimulation  Airway & Oxygen Therapy: Patient Spontanous Breathing and Patient connected to nasal cannula oxygen  Post-op Assessment: Report given to RN and Post -op Vital signs reviewed and stable  Post vital signs: Reviewed and stable  Last Vitals:  Vitals Value Taken Time  BP 147/87 10/27/19 1114  Temp 36.2 C 10/27/19 1113  Pulse 73 10/27/19 1116  Resp 22 10/27/19 1116  SpO2 98 % 10/27/19 1116  Vitals shown include unvalidated device data.  Last Pain:  Vitals:   10/27/19 1113  TempSrc:   PainSc: 1       Patients Stated Pain Goal: 3 (68/61/68 3729)  Complications: No apparent anesthesia complications

## 2019-10-27 NOTE — Anesthesia Preprocedure Evaluation (Signed)
Anesthesia Evaluation  Patient identified by MRN, date of birth, ID band Patient awake    Reviewed: Allergy & Precautions, H&P , NPO status , Patient's Chart, lab work & pertinent test results  History of Anesthesia Complications Negative for: history of anesthetic complications  Airway Mallampati: II  TM Distance: >3 FB Neck ROM: Full    Dental  (+) Edentulous Upper, Poor Dentition,    Pulmonary sleep apnea , COPD,  COPD inhaler,    breath sounds clear to auscultation       Cardiovascular hypertension, + CAD  + dysrhythmias Atrial Fibrillation  Rhythm:Regular Rate:Normal     Neuro/Psych PSYCHIATRIC DISORDERS Depression negative neurological ROS     GI/Hepatic negative GI ROS, Neg liver ROS,   Endo/Other  diabetes, Type 2, Insulin Dependent  Renal/GU DialysisRenal disease (HD T/Th/Sa)     Musculoskeletal negative musculoskeletal ROS (+)   Abdominal   Peds  Hematology negative hematology ROS (+)   Anesthesia Other Findings Day of surgery medications reviewed with the patient.  Reproductive/Obstetrics                             Anesthesia Physical  Anesthesia Plan  ASA: IV  Anesthesia Plan: MAC   Post-op Pain Management:    Induction: Intravenous  PONV Risk Score and Plan: Treatment may vary due to age or medical condition and Propofol infusion  Airway Management Planned: LMA  Additional Equipment:   Intra-op Plan:   Post-operative Plan:   Informed Consent: I have reviewed the patients History and Physical, chart, labs and discussed the procedure including the risks, benefits and alternatives for the proposed anesthesia with the patient or authorized representative who has indicated his/her understanding and acceptance.     Dental advisory given  Plan Discussed with: CRNA, Anesthesiologist and Surgeon  Anesthesia Plan Comments:         Anesthesia Quick  Evaluation

## 2019-10-27 NOTE — Discharge Instructions (Signed)
° °  [facility] Specialists of [facility]  Discharge Instructions  AV Fistula or Graft Surgery for Dialysis Access  Please refer to the following instructions for your post-procedure care. Your surgeon or physician assistant will discuss any changes with you.  Activity  You may drive the day following your surgery, if you are comfortable and no longer taking prescription pain medication. Resume full activity as the soreness in your incision resolves.  Bathing/Showering  You may shower after you go home. Keep your incision dry for 48 hours. Do not soak in a bathtub, hot tub, or swim until the incision heals completely. You may not shower if you have a hemodialysis catheter.  Incision Care  Clean your incision with mild soap and water after 48 hours. Pat the area dry with a clean towel. You do not need a bandage unless otherwise instructed. Do not apply any ointments or creams to your incision. You may have skin glue on your incision. Do not peel it off. It will come off on its own in about one week. Your arm may swell a bit after surgery. To reduce swelling use pillows to elevate your arm so it is above your heart. Your doctor will tell you if you need to lightly wrap your arm with an ACE bandage.  Diet  Resume your normal diet. There are not special food restrictions following this procedure. In order to heal from your surgery, it is CRITICAL to get adequate nutrition. Your body requires vitamins, minerals, and protein. Vegetables are the best source of vitamins and minerals. Vegetables also provide the perfect balance of protein. Processed food has little nutritional value, so try to avoid this.  Medications  Resume taking all of your medications. If your incision is causing pain, you may take over-the counter pain relievers such as acetaminophen (Tylenol). If you were prescribed a stronger pain medication, please be aware these medications can cause nausea and constipation. Prevent  nausea by taking the medication with a snack or meal. Avoid constipation by drinking plenty of fluids and eating foods with high amount of fiber, such as fruits, vegetables, and grains.  Do not take Tylenol if you are taking prescription pain medications.  Follow up Your surgeon may want to see you in the office following your access surgery. If so, this will be arranged at the time of your surgery.  Please call us immediately for any of the following conditions:  Increased pain, redness, drainage (pus) from your incision site Fever of 101 degrees or higher Severe or worsening pain at your incision site Hand pain or numbness.  Reduce your risk of vascular disease:  Stop smoking. If you would like help, call [facility] at 1-800-QUIT-NOW (615)304-3216) or [place] at [place] your cholesterol Maintain a desired weight Control your diabetes Keep your blood pressure down  Dialysis  It will take several weeks to several months for your new dialysis access to be ready for use. Your surgeon will determine when it is okay to use it. Your nephrologist will continue to direct your dialysis. You can continue to use your Permcath until your new access is ready for use.   10/27/2019 Christian Dalton [id number] 1941-07-19  Surgeon(s): Serafina Mitchell, MD  Procedure(s): Resection Of Left Upper Arm  Arteriovenous Fistula Aneurysm Times Two   If you have any questions, please call the office at [phone].

## 2019-10-28 ENCOUNTER — Encounter (HOSPITAL_COMMUNITY): Payer: Self-pay | Admitting: Surgery

## 2019-10-28 NOTE — Anesthesia Postprocedure Evaluation (Signed)
Anesthesia Post Note  Patient: Christian Dalton  Procedure(s) Performed: Resection Of Left Upper Arm  Arteriovenous Fistula Aneurysm Times Two (Left )     Patient location during evaluation: PACU Anesthesia Type: General Level of consciousness: awake and alert Pain management: pain level controlled Vital Signs Assessment: post-procedure vital signs reviewed and stable Respiratory status: spontaneous breathing, nonlabored ventilation, respiratory function stable and patient connected to nasal cannula oxygen Cardiovascular status: blood pressure returned to baseline and stable Postop Assessment: no apparent nausea or vomiting Anesthetic complications: no    Last Vitals:  Vitals:   10/27/19 1107 10/27/19 1113  BP: 140/82 (!) 147/87  Pulse: 71 71  Resp: 17 16  Temp:  (!) 36.2 C  SpO2: 95% 99%    Last Pain:  Vitals:   10/27/19 1113  TempSrc:   PainSc: 1                  Joban Colledge S

## 2019-11-16 ENCOUNTER — Other Ambulatory Visit: Payer: Self-pay | Admitting: Gastroenterology

## 2019-11-16 DIAGNOSIS — K862 Cyst of pancreas: Secondary | ICD-10-CM

## 2019-11-16 DIAGNOSIS — R935 Abnormal findings on diagnostic imaging of other abdominal regions, including retroperitoneum: Secondary | ICD-10-CM

## 2019-11-16 DIAGNOSIS — N281 Cyst of kidney, acquired: Secondary | ICD-10-CM

## 2019-11-25 DIAGNOSIS — N186 End stage renal disease: Secondary | ICD-10-CM | POA: Diagnosis not present

## 2019-11-25 DIAGNOSIS — Z992 Dependence on renal dialysis: Secondary | ICD-10-CM | POA: Diagnosis not present

## 2019-11-25 DIAGNOSIS — E1122 Type 2 diabetes mellitus with diabetic chronic kidney disease: Secondary | ICD-10-CM | POA: Diagnosis not present

## 2019-11-27 DIAGNOSIS — E1121 Type 2 diabetes mellitus with diabetic nephropathy: Secondary | ICD-10-CM | POA: Diagnosis not present

## 2019-11-27 DIAGNOSIS — Z992 Dependence on renal dialysis: Secondary | ICD-10-CM | POA: Diagnosis not present

## 2019-11-27 DIAGNOSIS — N186 End stage renal disease: Secondary | ICD-10-CM | POA: Diagnosis not present

## 2019-11-27 DIAGNOSIS — N2581 Secondary hyperparathyroidism of renal origin: Secondary | ICD-10-CM | POA: Diagnosis not present

## 2019-11-28 ENCOUNTER — Other Ambulatory Visit: Payer: Self-pay

## 2019-11-28 ENCOUNTER — Encounter: Payer: Self-pay | Admitting: Surgery

## 2019-11-28 ENCOUNTER — Ambulatory Visit (INDEPENDENT_AMBULATORY_CARE_PROVIDER_SITE_OTHER): Payer: Self-pay | Admitting: Surgery

## 2019-11-28 ENCOUNTER — Other Ambulatory Visit: Payer: Self-pay | Admitting: *Deleted

## 2019-11-28 ENCOUNTER — Ambulatory Visit (HOSPITAL_COMMUNITY)
Admission: RE | Admit: 2019-11-28 | Discharge: 2019-11-28 | Disposition: A | Discharge status: Home / Self Care | Payer: Medicare Other | Source: Ambulatory Visit | Attending: Surgery | Admitting: Surgery

## 2019-11-28 VITALS — BP 119/78 | HR 82 | Temp 97.6°F | Resp 20 | Ht 70.0 in | Wt 214.0 lb

## 2019-11-28 DIAGNOSIS — Z992 Dependence on renal dialysis: Secondary | ICD-10-CM

## 2019-11-28 DIAGNOSIS — N186 End stage renal disease: Secondary | ICD-10-CM | POA: Insufficient documentation

## 2019-11-28 NOTE — H&P (View-Only) (Signed)
Patient name: Christian Dalton MRN: [id number] DOB: [birth year] Sex: male  REASON FOR VISIT:    Follow-up  HISTORY OF PRESENT ILLNESS:   Christian Dalton is a 79 y.o. male who presented to the emergency department with a thrombosed fistula and 2 large aneurysmal areas that have been oozing despite fistula occlusion.  He went to the operating room and have these areas resected.  He is back today to discuss new access.  CURRENT MEDICATIONS:    Current Outpatient Medications  Medication Sig Dispense Refill  . aspirin EC 81 MG tablet Take 81 mg by mouth at bedtime.     Marland Kitchen atorvastatin (LIPITOR) 10 MG tablet Take 10 mg by mouth at bedtime.    Marland Kitchen buPROPion (WELLBUTRIN XL) 150 MG 24 hr tablet Take 150 mg by mouth at bedtime.     . capsaicin (ZOSTRIX) 0.025 % cream Apply 1 application topically 4 (four) times daily as needed (to legs, for pain).     Marland Kitchen docusate sodium (COLACE) 100 MG capsule Take 100 mg by mouth 2 (two) times daily.     Marland Kitchen escitalopram (LEXAPRO) 10 MG tablet Take 10 mg by mouth at bedtime.     . gabapentin (NEURONTIN) 300 MG capsule Take 300 mg by mouth at bedtime.    . insulin detemir (LEVEMIR) 100 UNIT/ML injection Inject 0.14 mLs (14 Units total) into the skin 2 (two) times daily. (Patient taking differently: Inject 16-20 Units into the skin See admin instructions. Inject 20 units into the skin before breakfast and 20 units before supper, per sliding scale) 10 mL 11  . Insulin Lispro Prot & Lispro (HUMALOG MIX 75/25 KWIKPEN) (75-25) 100 UNIT/ML Kwikpen Take 10 Units by mouth 2 (two) times daily.    Marland Kitchen lactulose, encephalopathy, (CHRONULAC) 10 GM/15ML SOLN Take 15 mLs by mouth daily before supper.   4  . levothyroxine (SYNTHROID, LEVOTHROID) 125 MCG tablet Take 125 mcg by mouth daily before breakfast.     . lidocaine-prilocaine (EMLA) cream Apply 1 application topically See admin instructions. To access site (AVF) 1-2 hours before dialysis. Cover with  occlusive dressing (Saran warp)  4  . linaclotide (LINZESS) 290 MCG CAPS capsule Take 290 mcg by mouth daily before supper.     . loratadine (CLARITIN) 10 MG tablet Take 10 mg every evening by mouth.    . midodrine (PROAMATINE) 2.5 MG tablet Take 2.5 mg by mouth 3 (three) times daily.    . montelukast (SINGULAIR) 10 MG tablet Take 10 mg every evening by mouth.    . oxyCODONE-acetaminophen (PERCOCET) 10-325 MG tablet Take 1 tablet by mouth See admin instructions. Take 1 tablet by mouth one to four times a day as needed for pain    . polyethylene glycol powder (GLYCOLAX/MIRALAX) 17 GM/SCOOP powder Take 17 g by mouth daily before supper.    . sevelamer carbonate (RENVELA) 800 MG tablet Take 3 tablets (2,400 mg total) by mouth 3 (three) times daily with meals. (Patient taking differently: Take 2,400 mg by mouth 2 (two) times daily with a meal. ) 45 tablet 0  . atorvastatin (LIPITOR) 20 MG tablet Take 1 tablet (20 mg total) by mouth every evening. (Patient not taking: Reported on 11/28/2019)     No current facility-administered medications for this visit.    REVIEW OF SYSTEMS:   [X]  denotes positive finding, [ ]  denotes negative finding Cardiac  Comments:  Chest pain or chest pressure:    Shortness of breath upon exertion:    Short of breath  when lying flat:    Irregular heart rhythm:    Constitutional    Fever or chills:      PHYSICAL EXAM:   Vitals:   11/28/19 1441  BP: 119/78  Pulse: 82  Resp: 20  Temp: 97.6 F (36.4 C)  SpO2: 99%  Weight: 214 lb (97.1 kg)  Height: 5\' 10"  (1.778 m)    GENERAL: The patient is a well-nourished male, in no acute distress. The vital signs are documented above. CARDIOVASCULAR: There is a regular rate and rhythm. PULMONARY: Non-labored respirations Incisions have healed up nicely in the upper arm.  STUDIES:   +-----------------+-------------+----------+--------+ Right Cephalic   Diameter (cm)Depth  (cm)Findings +-----------------+-------------+----------+--------+ Shoulder             0.42                        +-----------------+-------------+----------+--------+ Prox upper arm       0.48                        +-----------------+-------------+----------+--------+ Mid upper arm        0.49                        +-----------------+-------------+----------+--------+ Dist upper arm       0.48                        +-----------------+-------------+----------+--------+ Antecubital fossa    0.30                        +-----------------+-------------+----------+--------+ Prox forearm         0.32                        +-----------------+-------------+----------+--------+ Mid forearm          0.43                        +-----------------+-------------+----------+--------+ Dist forearm         0.36                        +-----------------+-------------+----------+--------+  +-----------------+-------------+----------+--------------+ Right Basilic    Diameter (cm)Depth (cm)   Findings    +-----------------+-------------+----------+--------------+ Dist upper arm       0.63                              +-----------------+-------------+----------+--------------+ Antecubital fossa    0.36               from deep vein +-----------------+-------------+----------+--------------+   MEDICAL ISSUES:   He has recovered nicely from ligation of his fistula on the left arm.  Vein mapping today shows that he has adequate left basilic vein.  We discussed proceeding with a staged left basilic vein fistula creation.  I am going to do this on Friday, January 8  Leia Alf, MD, [facility] [phone] Pager [phone]

## 2019-11-28 NOTE — Addendum Note (Signed)
Addended by: York Cerise C on: 11/28/2019 09:00 AM   Modules accepted: Orders

## 2019-11-28 NOTE — Progress Notes (Signed)
Patient name: Christian Dalton MRN: [id number] DOB: Jun 12, 1941 Sex: male  REASON FOR VISIT:    Follow-up  HISTORY OF PRESENT ILLNESS:   Josiel Heinicke is a 79 y.o. male who presented to the emergency department with a thrombosed fistula and 2 large aneurysmal areas that have been oozing despite fistula occlusion.  He went to the operating room and have these areas resected.  He is back today to discuss new access.  CURRENT MEDICATIONS:    Current Outpatient Medications  Medication Sig Dispense Refill  . aspirin EC 81 MG tablet Take 81 mg by mouth at bedtime.     Marland Kitchen atorvastatin (LIPITOR) 10 MG tablet Take 10 mg by mouth at bedtime.    Marland Kitchen buPROPion (WELLBUTRIN XL) 150 MG 24 hr tablet Take 150 mg by mouth at bedtime.     . capsaicin (ZOSTRIX) 0.025 % cream Apply 1 application topically 4 (four) times daily as needed (to legs, for pain).     Marland Kitchen docusate sodium (COLACE) 100 MG capsule Take 100 mg by mouth 2 (two) times daily.     Marland Kitchen escitalopram (LEXAPRO) 10 MG tablet Take 10 mg by mouth at bedtime.     . gabapentin (NEURONTIN) 300 MG capsule Take 300 mg by mouth at bedtime.    . insulin detemir (LEVEMIR) 100 UNIT/ML injection Inject 0.14 mLs (14 Units total) into the skin 2 (two) times daily. (Patient taking differently: Inject 16-20 Units into the skin See admin instructions. Inject 20 units into the skin before breakfast and 20 units before supper, per sliding scale) 10 mL 11  . Insulin Lispro Prot & Lispro (HUMALOG MIX 75/25 KWIKPEN) (75-25) 100 UNIT/ML Kwikpen Take 10 Units by mouth 2 (two) times daily.    Marland Kitchen lactulose, encephalopathy, (CHRONULAC) 10 GM/15ML SOLN Take 15 mLs by mouth daily before supper.   4  . levothyroxine (SYNTHROID, LEVOTHROID) 125 MCG tablet Take 125 mcg by mouth daily before breakfast.     . lidocaine-prilocaine (EMLA) cream Apply 1 application topically See admin instructions. To access site (AVF) 1-2 hours before dialysis. Cover with  occlusive dressing (Saran warp)  4  . linaclotide (LINZESS) 290 MCG CAPS capsule Take 290 mcg by mouth daily before supper.     . loratadine (CLARITIN) 10 MG tablet Take 10 mg every evening by mouth.    . midodrine (PROAMATINE) 2.5 MG tablet Take 2.5 mg by mouth 3 (three) times daily.    . montelukast (SINGULAIR) 10 MG tablet Take 10 mg every evening by mouth.    . oxyCODONE-acetaminophen (PERCOCET) 10-325 MG tablet Take 1 tablet by mouth See admin instructions. Take 1 tablet by mouth one to four times a day as needed for pain    . polyethylene glycol powder (GLYCOLAX/MIRALAX) 17 GM/SCOOP powder Take 17 g by mouth daily before supper.    . sevelamer carbonate (RENVELA) 800 MG tablet Take 3 tablets (2,400 mg total) by mouth 3 (three) times daily with meals. (Patient taking differently: Take 2,400 mg by mouth 2 (two) times daily with a meal. ) 45 tablet 0  . atorvastatin (LIPITOR) 20 MG tablet Take 1 tablet (20 mg total) by mouth every evening. (Patient not taking: Reported on 11/28/2019)     No current facility-administered medications for this visit.    REVIEW OF SYSTEMS:   [X]  denotes positive finding, [ ]  denotes negative finding Cardiac  Comments:  Chest pain or chest pressure:    Shortness of breath upon exertion:    Short of breath  when lying flat:    Irregular heart rhythm:    Constitutional    Fever or chills:      PHYSICAL EXAM:   Vitals:   11/28/19 1441  BP: 119/78  Pulse: 82  Resp: 20  Temp: 97.6 F (36.4 C)  SpO2: 99%  Weight: 214 lb (97.1 kg)  Height: 5\' 10"  (1.778 m)    GENERAL: The patient is a well-nourished male, in no acute distress. The vital signs are documented above. CARDIOVASCULAR: There is a regular rate and rhythm. PULMONARY: Non-labored respirations Incisions have healed up nicely in the upper arm.  STUDIES:   +-----------------+-------------+----------+--------+ Right Cephalic   Diameter (cm)Depth  (cm)Findings +-----------------+-------------+----------+--------+ Shoulder             0.42                        +-----------------+-------------+----------+--------+ Prox upper arm       0.48                        +-----------------+-------------+----------+--------+ Mid upper arm        0.49                        +-----------------+-------------+----------+--------+ Dist upper arm       0.48                        +-----------------+-------------+----------+--------+ Antecubital fossa    0.30                        +-----------------+-------------+----------+--------+ Prox forearm         0.32                        +-----------------+-------------+----------+--------+ Mid forearm          0.43                        +-----------------+-------------+----------+--------+ Dist forearm         0.36                        +-----------------+-------------+----------+--------+  +-----------------+-------------+----------+--------------+ Right Basilic    Diameter (cm)Depth (cm)   Findings    +-----------------+-------------+----------+--------------+ Dist upper arm       0.63                              +-----------------+-------------+----------+--------------+ Antecubital fossa    0.36               from deep vein +-----------------+-------------+----------+--------------+   MEDICAL ISSUES:   He has recovered nicely from ligation of his fistula on the left arm.  Vein mapping today shows that he has adequate left basilic vein.  We discussed proceeding with a staged left basilic vein fistula creation.  I am going to do this on Friday, January 8  Leia Alf, MD, [facility] [phone] Pager [phone]

## 2019-11-29 DIAGNOSIS — E1121 Type 2 diabetes mellitus with diabetic nephropathy: Secondary | ICD-10-CM | POA: Diagnosis not present

## 2019-11-29 DIAGNOSIS — N2581 Secondary hyperparathyroidism of renal origin: Secondary | ICD-10-CM | POA: Diagnosis not present

## 2019-11-29 DIAGNOSIS — Z992 Dependence on renal dialysis: Secondary | ICD-10-CM | POA: Diagnosis not present

## 2019-11-29 DIAGNOSIS — N186 End stage renal disease: Secondary | ICD-10-CM | POA: Diagnosis not present

## 2019-11-30 ENCOUNTER — Encounter (HOSPITAL_COMMUNITY): Payer: Self-pay | Admitting: Surgery

## 2019-11-30 ENCOUNTER — Other Ambulatory Visit: Payer: Self-pay

## 2019-11-30 ENCOUNTER — Other Ambulatory Visit (HOSPITAL_COMMUNITY)
Admission: RE | Admit: 2019-11-30 | Discharge: 2019-11-30 | Disposition: A | Discharge status: Home / Self Care | Payer: Medicare Other | Source: Ambulatory Visit | Attending: Surgery | Admitting: Surgery

## 2019-11-30 DIAGNOSIS — N185 Chronic kidney disease, stage 5: Secondary | ICD-10-CM | POA: Diagnosis not present

## 2019-11-30 DIAGNOSIS — Z20822 Contact with and (suspected) exposure to covid-19: Secondary | ICD-10-CM | POA: Diagnosis not present

## 2019-11-30 DIAGNOSIS — N186 End stage renal disease: Secondary | ICD-10-CM | POA: Diagnosis not present

## 2019-11-30 DIAGNOSIS — E1142 Type 2 diabetes mellitus with diabetic polyneuropathy: Secondary | ICD-10-CM | POA: Diagnosis not present

## 2019-11-30 DIAGNOSIS — Z794 Long term (current) use of insulin: Secondary | ICD-10-CM | POA: Diagnosis not present

## 2019-11-30 DIAGNOSIS — E1122 Type 2 diabetes mellitus with diabetic chronic kidney disease: Secondary | ICD-10-CM | POA: Diagnosis not present

## 2019-11-30 DIAGNOSIS — Z992 Dependence on renal dialysis: Secondary | ICD-10-CM | POA: Diagnosis not present

## 2019-11-30 DIAGNOSIS — Z01812 Encounter for preprocedural laboratory examination: Secondary | ICD-10-CM | POA: Diagnosis not present

## 2019-11-30 DIAGNOSIS — E1151 Type 2 diabetes mellitus with diabetic peripheral angiopathy without gangrene: Secondary | ICD-10-CM | POA: Diagnosis not present

## 2019-11-30 LAB — SARS CORONAVIRUS 2 (TAT 6-24 HRS): SARS Coronavirus 2: NEGATIVE

## 2019-11-30 NOTE — Progress Notes (Signed)
Pt denies SOB and chest pain. SDW-pre-op call completed by pt spouse, [name], per pt request. Spouse stated that pt is under the care of Dr. Abran Richard, Cardiology and Dr. Elvera Maria, PCP. Spouse made aware to have pt stop taking vitamins, fish oil and herbal medications. Do not take any NSAIDs ie: Ibuprofen, Advil, Naproxen (Aleve), Motrin, BC and Goody Powder. Nurse requested EKG tracing from [facility] (Dr. Otho Perl, Cardiology); awaiting response. Spouse made aware to have pt take 70% SS of Humalog 75/25 insulin the night before surgery and no Humalog insulin DOS. Spouse made aware to have pt take 50% of SS of Levemir insulin the night before surgery and 50% the morning of surgery if CBG is > 70. Spouse made aware to have pt check CBG every 2 hours prior to arrival to hospital on DOS. Spouse made aware to have pt hold 50% of Levemir insulin for a CBG <70 and instead treat a CBG < 70 with 4 ounces of apple juice, wait 15 minutes after intervention to recheck CBG, if BG remains < 70, call Short Stay unit to speak with a nurse. Spouse verbalized understanding of all pre-op instructions. PA, Anesthesiology, asked to review pt cardiac history.

## 2019-12-01 DIAGNOSIS — N2581 Secondary hyperparathyroidism of renal origin: Secondary | ICD-10-CM | POA: Diagnosis not present

## 2019-12-01 DIAGNOSIS — N186 End stage renal disease: Secondary | ICD-10-CM | POA: Diagnosis not present

## 2019-12-01 DIAGNOSIS — Z992 Dependence on renal dialysis: Secondary | ICD-10-CM | POA: Diagnosis not present

## 2019-12-01 DIAGNOSIS — E1121 Type 2 diabetes mellitus with diabetic nephropathy: Secondary | ICD-10-CM | POA: Diagnosis not present

## 2019-12-01 NOTE — Anesthesia Preprocedure Evaluation (Addendum)
Anesthesia Evaluation  Patient identified by MRN, date of birth, ID band Patient awake    Reviewed: Allergy & Precautions, NPO status , Patient's Chart, lab work & pertinent test results  History of Anesthesia Complications Negative for: history of anesthetic complications  Airway Mallampati: II  TM Distance: >3 FB Neck ROM: Full    Dental  (+) Dental Advisory Given, Edentulous Upper, Missing, Caps   Pulmonary sleep apnea , COPD,  COPD inhaler,    Pulmonary exam normal        Cardiovascular hypertension, (-) angina+ CAD  Normal cardiovascular exam+ dysrhythmias Atrial Fibrillation    '19 TTE (Care Everywhere) - Mild LVH. EF 60-65%. There is mild aortic sclerosis noted, with no evidence of stenosis.    Neuro/Psych PSYCHIATRIC DISORDERS Depression  Hard of hearing     GI/Hepatic negative GI ROS, Neg liver ROS,   Endo/Other  diabetes, Type 2, Insulin DependentHypothyroidism  Obesity   Renal/GU ESRF and DialysisRenal disease     Musculoskeletal  (+) Arthritis ,   Abdominal   Peds  Hematology negative hematology ROS (+)   Anesthesia Other Findings Covid neg 1/6   Reproductive/Obstetrics                          Anesthesia Physical Anesthesia Plan  ASA: III  Anesthesia Plan: General   Post-op Pain Management:    Induction: Intravenous  PONV Risk Score and Plan: 2 and Treatment may vary due to age or medical condition and Ondansetron  Airway Management Planned: LMA  Additional Equipment: None  Intra-op Plan:   Post-operative Plan: Extubation in OR  Informed Consent: I have reviewed the patients History and Physical, chart, labs and discussed the procedure including the risks, benefits and alternatives for the proposed anesthesia with the patient or authorized representative who has indicated his/her understanding and acceptance.     Dental advisory given  Plan Discussed  with: CRNA and Anesthesiologist  Anesthesia Plan Comments:      Anesthesia Quick Evaluation

## 2019-12-01 NOTE — Progress Notes (Signed)
Anesthesia Chart Review: Same day workup  Follows with cardiology at [facility] for hx of LBBB, CAD (mild nonobstructive by cath 2016). He was last seen 06/15/19, stable from CV standpoint.   IDDMII well controlled by last A1c 6.4 on 04/08/19.   Will need DOS labs and eval.   EKG 04/08/19: Sinus rhythm with 1st degree A-V block. Rate 77. Left anterior fasicular block. Moderate voltage criteria for LVH, may be normal variant. T wave abnormality, consider lateral ischemia. Prolonged QT (QTc 495).  EKG was done during admission 04/07/19 for nausea and vomiting -  Per discharge summary "T wave versions on EKG -x1, resolved on repeat EKGs.  Patient without any cardiac symptoms, no chest pain, dyspnea on exertion, any other limiting factors in his activity levels or any anginal symptoms.  His troponin has remained negative." Patient had repeat EKG on 04/22/19 by PCP showing sinus rhythm, LAD, LVH - narrative in care everywhere, tracing requested.   TTE 10/04/18 (care everywhere): Summary Mild left ventricular hypertrophy Normal left ventricular systolic function with no segmental wall motion abnormalities. Ejection fraction is visually estimated at 43-20% Diastolic function appears normal There is mild aortic sclerosis noted, with no evidence of stenosis.   Wynonia Musty [facility] Short Stay Center/Anesthesiology Phone [phone] 12/01/2019 10:17 AM

## 2019-12-02 ENCOUNTER — Other Ambulatory Visit: Payer: Self-pay

## 2019-12-02 ENCOUNTER — Ambulatory Visit (HOSPITAL_COMMUNITY): Payer: Medicare Other | Admitting: Physician Assistant

## 2019-12-02 ENCOUNTER — Encounter (HOSPITAL_COMMUNITY)
Admission: RE | Disposition: A | Discharge status: Home / Self Care | Payer: Self-pay | Source: Home / Self Care | Attending: Surgery

## 2019-12-02 ENCOUNTER — Ambulatory Visit (HOSPITAL_COMMUNITY)
Admission: RE | Admit: 2019-12-02 | Discharge: 2019-12-02 | Disposition: A | Discharge status: Home / Self Care | Payer: Medicare Other | Attending: Surgery | Admitting: Surgery

## 2019-12-02 ENCOUNTER — Encounter (HOSPITAL_COMMUNITY): Payer: Self-pay | Admitting: Surgery

## 2019-12-02 DIAGNOSIS — Z79899 Other long term (current) drug therapy: Secondary | ICD-10-CM | POA: Diagnosis not present

## 2019-12-02 DIAGNOSIS — Z992 Dependence on renal dialysis: Secondary | ICD-10-CM | POA: Insufficient documentation

## 2019-12-02 DIAGNOSIS — T82898A Other specified complication of vascular prosthetic devices, implants and grafts, initial encounter: Secondary | ICD-10-CM | POA: Diagnosis present

## 2019-12-02 DIAGNOSIS — Z8249 Family history of ischemic heart disease and other diseases of the circulatory system: Secondary | ICD-10-CM | POA: Insufficient documentation

## 2019-12-02 DIAGNOSIS — I120 Hypertensive chronic kidney disease with stage 5 chronic kidney disease or end stage renal disease: Secondary | ICD-10-CM | POA: Insufficient documentation

## 2019-12-02 DIAGNOSIS — F329 Major depressive disorder, single episode, unspecified: Secondary | ICD-10-CM | POA: Diagnosis not present

## 2019-12-02 DIAGNOSIS — Z91013 Allergy to seafood: Secondary | ICD-10-CM | POA: Diagnosis not present

## 2019-12-02 DIAGNOSIS — X58XXXA Exposure to other specified factors, initial encounter: Secondary | ICD-10-CM | POA: Diagnosis not present

## 2019-12-02 DIAGNOSIS — E669 Obesity, unspecified: Secondary | ICD-10-CM | POA: Insufficient documentation

## 2019-12-02 DIAGNOSIS — J449 Chronic obstructive pulmonary disease, unspecified: Secondary | ICD-10-CM | POA: Insufficient documentation

## 2019-12-02 DIAGNOSIS — Z888 Allergy status to other drugs, medicaments and biological substances status: Secondary | ICD-10-CM | POA: Diagnosis not present

## 2019-12-02 DIAGNOSIS — G4730 Sleep apnea, unspecified: Secondary | ICD-10-CM | POA: Insufficient documentation

## 2019-12-02 DIAGNOSIS — M1990 Unspecified osteoarthritis, unspecified site: Secondary | ICD-10-CM | POA: Insufficient documentation

## 2019-12-02 DIAGNOSIS — T82868A Thrombosis of vascular prosthetic devices, implants and grafts, initial encounter: Secondary | ICD-10-CM | POA: Diagnosis not present

## 2019-12-02 DIAGNOSIS — Z794 Long term (current) use of insulin: Secondary | ICD-10-CM | POA: Diagnosis not present

## 2019-12-02 DIAGNOSIS — I700 Atherosclerosis of aorta: Secondary | ICD-10-CM | POA: Insufficient documentation

## 2019-12-02 DIAGNOSIS — Z6830 Body mass index (BMI) 30.0-30.9, adult: Secondary | ICD-10-CM | POA: Diagnosis not present

## 2019-12-02 DIAGNOSIS — Z882 Allergy status to sulfonamides status: Secondary | ICD-10-CM | POA: Diagnosis not present

## 2019-12-02 DIAGNOSIS — H9190 Unspecified hearing loss, unspecified ear: Secondary | ICD-10-CM | POA: Diagnosis not present

## 2019-12-02 DIAGNOSIS — E1122 Type 2 diabetes mellitus with diabetic chronic kidney disease: Secondary | ICD-10-CM | POA: Diagnosis not present

## 2019-12-02 DIAGNOSIS — N186 End stage renal disease: Secondary | ICD-10-CM | POA: Diagnosis not present

## 2019-12-02 DIAGNOSIS — Z7982 Long term (current) use of aspirin: Secondary | ICD-10-CM | POA: Diagnosis not present

## 2019-12-02 DIAGNOSIS — Z91048 Other nonmedicinal substance allergy status: Secondary | ICD-10-CM | POA: Insufficient documentation

## 2019-12-02 DIAGNOSIS — N185 Chronic kidney disease, stage 5: Secondary | ICD-10-CM | POA: Diagnosis not present

## 2019-12-02 HISTORY — DX: Hypothyroidism, unspecified: E03.9

## 2019-12-02 HISTORY — PX: BASCILIC VEIN TRANSPOSITION: SHX5742

## 2019-12-02 HISTORY — DX: Unspecified hearing loss, unspecified ear: H91.90

## 2019-12-02 HISTORY — DX: Presence of dental prosthetic device (complete) (partial): Z97.2

## 2019-12-02 HISTORY — DX: Presence of external hearing-aid: Z97.4

## 2019-12-02 HISTORY — DX: Presence of spectacles and contact lenses: Z97.3

## 2019-12-02 HISTORY — DX: Anemia, unspecified: D64.9

## 2019-12-02 HISTORY — DX: Unspecified osteoarthritis, unspecified site: M19.90

## 2019-12-02 LAB — GLUCOSE, CAPILLARY
Glucose-Capillary: 167 mg/dL — ABNORMAL HIGH (ref 70–99)
Glucose-Capillary: 149 mg/dL — ABNORMAL HIGH (ref 70–99)

## 2019-12-02 LAB — POCT I-STAT, CHEM 8
BUN: 28 mg/dL — ABNORMAL HIGH (ref 8–23)
Calcium, Ion: 1.18 mmol/L (ref 1.15–1.40)
Chloride: 99 mmol/L (ref 98–111)
Creatinine, Ser: 6.8 mg/dL — ABNORMAL HIGH (ref 0.61–1.24)
Glucose, Bld: 161 mg/dL — ABNORMAL HIGH (ref 70–99)
HCT: 43 % (ref 39.0–52.0)
Hemoglobin: 14.6 g/dL (ref 13.0–17.0)
Potassium: 3.4 mmol/L — ABNORMAL LOW (ref 3.5–5.1)
Sodium: 135 mmol/L (ref 135–145)
TCO2: 25 mmol/L (ref 22–32)

## 2019-12-02 SURGERY — TRANSPOSITION, VEIN, BASILIC
Anesthesia: Monitor Anesthesia Care | Site: Arm Upper | Laterality: Left

## 2019-12-02 MED ORDER — FENTANYL CITRATE (PF) 250 MCG/5ML IJ SOLN
INTRAMUSCULAR | Status: AC
  Filled 2019-12-02: qty 5

## 2019-12-02 MED ORDER — LIDOCAINE-EPINEPHRINE 1 %-1:100000 IJ SOLN
INTRAMUSCULAR | Status: AC
  Filled 2019-12-02: qty 1

## 2019-12-02 MED ORDER — VANCOMYCIN HCL IN DEXTROSE 1-5 GM/200ML-% IV SOLN: 1000.0000 mg | INTRAVENOUS | Status: AC

## 2019-12-02 MED ORDER — CHLORHEXIDINE GLUCONATE 4 % EX LIQD: 60.0000 mL | Freq: Once | CUTANEOUS | Status: DC

## 2019-12-02 MED ORDER — PHENYLEPHRINE HCL-NACL 10-0.9 MG/250ML-% IV SOLN
INTRAVENOUS | Status: DC | PRN
  Administered 2019-12-02: 25 ug/min via INTRAVENOUS

## 2019-12-02 MED ORDER — OXYCODONE HCL 5 MG PO TABS: 5.0000 mg | ORAL_TABLET | Freq: Once | ORAL | Status: DC | PRN

## 2019-12-02 MED ORDER — 0.9 % SODIUM CHLORIDE (POUR BTL) OPTIME
TOPICAL | Status: DC | PRN
  Administered 2019-12-02: 09:00:00 1000 mL

## 2019-12-02 MED ORDER — LIDOCAINE 2% (20 MG/ML) 5 ML SYRINGE
INTRAMUSCULAR | Status: DC | PRN
  Administered 2019-12-02: 60 mg via INTRAVENOUS

## 2019-12-02 MED ORDER — OXYCODONE HCL 5 MG/5ML PO SOLN: 5.0000 mg | Freq: Once | ORAL | Status: DC | PRN

## 2019-12-02 MED ORDER — SODIUM CHLORIDE 0.9 % IV SOLN: INTRAVENOUS | Status: DC

## 2019-12-02 MED ORDER — PROPOFOL 10 MG/ML IV BOLUS
INTRAVENOUS | Status: AC
  Filled 2019-12-02: qty 20

## 2019-12-02 MED ORDER — VANCOMYCIN HCL IN DEXTROSE 1-5 GM/200ML-% IV SOLN
INTRAVENOUS | Status: AC
  Administered 2019-12-02: 1000 mg via INTRAVENOUS
  Filled 2019-12-02: qty 200

## 2019-12-02 MED ORDER — FENTANYL CITRATE (PF) 100 MCG/2ML IJ SOLN
INTRAMUSCULAR | Status: DC | PRN
  Administered 2019-12-02: 50 ug via INTRAVENOUS

## 2019-12-02 MED ORDER — PROPOFOL 10 MG/ML IV BOLUS
INTRAVENOUS | Status: DC | PRN
  Administered 2019-12-02: 120 mg via INTRAVENOUS

## 2019-12-02 MED ORDER — SODIUM CHLORIDE 0.9 % IV SOLN
INTRAVENOUS | Status: AC
  Filled 2019-12-02: qty 1.2

## 2019-12-02 MED ORDER — LIDOCAINE 2% (20 MG/ML) 5 ML SYRINGE
INTRAMUSCULAR | Status: AC
  Filled 2019-12-02: qty 5

## 2019-12-02 MED ORDER — ONDANSETRON HCL 4 MG/2ML IJ SOLN: 4.0000 mg | Freq: Once | INTRAMUSCULAR | Status: DC | PRN

## 2019-12-02 MED ORDER — PHENYLEPHRINE 40 MCG/ML (10ML) SYRINGE FOR IV PUSH (FOR BLOOD PRESSURE SUPPORT)
PREFILLED_SYRINGE | INTRAVENOUS | Status: DC | PRN
  Administered 2019-12-02: 120 ug via INTRAVENOUS

## 2019-12-02 MED ORDER — FENTANYL CITRATE (PF) 100 MCG/2ML IJ SOLN: 25.0000 ug | INTRAMUSCULAR | Status: DC | PRN

## 2019-12-02 MED ORDER — SODIUM CHLORIDE 0.9 % IV SOLN
INTRAVENOUS | Status: DC | PRN
  Administered 2019-12-02: 500 mL

## 2019-12-02 MED ORDER — ONDANSETRON HCL 4 MG/2ML IJ SOLN
INTRAMUSCULAR | Status: DC | PRN
  Administered 2019-12-02: 4 mg via INTRAVENOUS

## 2019-12-02 SURGICAL SUPPLY — 33 items
ARMBAND PINK RESTRICT EXTREMIT (MISCELLANEOUS) ×3 IMPLANT
CANISTER SUCT 3000ML PPV (MISCELLANEOUS) ×3 IMPLANT
CLIP VESOCCLUDE MED 24/CT (CLIP) IMPLANT
CLIP VESOCCLUDE MED 6/CT (CLIP) IMPLANT
CLIP VESOCCLUDE SM WIDE 24/CT (CLIP) IMPLANT
CLIP VESOCCLUDE SM WIDE 6/CT (CLIP) IMPLANT
COVER PROBE W GEL 5X96 (DRAPES) ×3 IMPLANT
COVER WAND RF STERILE (DRAPES) ×3 IMPLANT
DERMABOND ADVANCED (GAUZE/BANDAGES/DRESSINGS) ×2
DERMABOND ADVANCED .7 DNX12 (GAUZE/BANDAGES/DRESSINGS) ×1 IMPLANT
ELECT REM PT RETURN 9FT ADLT (ELECTROSURGICAL) ×3
ELECTRODE REM PT RTRN 9FT ADLT (ELECTROSURGICAL) ×1 IMPLANT
GLOVE BIOGEL PI IND STRL 7.5 (GLOVE) ×1 IMPLANT
GLOVE BIOGEL PI INDICATOR 7.5 (GLOVE) ×2
GLOVE SURG SS PI 7.5 STRL IVOR (GLOVE) ×3 IMPLANT
GOWN STRL REUS W/ TWL LRG LVL3 (GOWN DISPOSABLE) ×2 IMPLANT
GOWN STRL REUS W/ TWL XL LVL3 (GOWN DISPOSABLE) ×1 IMPLANT
GOWN STRL REUS W/TWL LRG LVL3 (GOWN DISPOSABLE) ×4
GOWN STRL REUS W/TWL XL LVL3 (GOWN DISPOSABLE) ×2
HEMOSTAT SNOW SURGICEL 2X4 (HEMOSTASIS) IMPLANT
KIT BASIN OR (CUSTOM PROCEDURE TRAY) ×3 IMPLANT
KIT TURNOVER KIT B (KITS) ×3 IMPLANT
NS IRRIG 1000ML POUR BTL (IV SOLUTION) ×3 IMPLANT
PACK CV ACCESS (CUSTOM PROCEDURE TRAY) ×3 IMPLANT
PAD ARMBOARD 7.5X6 YLW CONV (MISCELLANEOUS) ×6 IMPLANT
SUT PROLENE 6 0 CC (SUTURE) ×3 IMPLANT
SUT SILK 2 0 SH (SUTURE) IMPLANT
SUT VIC AB 3-0 SH 27 (SUTURE) ×2
SUT VIC AB 3-0 SH 27X BRD (SUTURE) ×1 IMPLANT
SUT VICRYL 4-0 PS2 18IN ABS (SUTURE) ×3 IMPLANT
TOWEL GREEN STERILE (TOWEL DISPOSABLE) ×3 IMPLANT
UNDERPAD 30X30 (UNDERPADS AND DIAPERS) ×3 IMPLANT
WATER STERILE IRR 1000ML POUR (IV SOLUTION) ×3 IMPLANT

## 2019-12-02 NOTE — Interval H&P Note (Signed)
History and Physical Interval Note:  12/02/2019 7:27 AM  Christian Dalton  has presented today for surgery, with the diagnosis of END STAGE RENAL DISEASE.  The various methods of treatment have been discussed with the patient and family. After consideration of risks, benefits and other options for treatment, the patient has consented to  Procedure(s): [place] (Left) as a surgical intervention.  The patient's history has been reviewed, patient examined, no change in status, stable for surgery.  I have reviewed the patient's chart and labs.  Questions were answered to the patient's satisfaction.     [name]

## 2019-12-02 NOTE — Anesthesia Procedure Notes (Signed)
Procedure Name: LMA Insertion Date/Time: 12/02/2019 9:27 AM Performed by: Kyung Rudd, CRNA Pre-anesthesia Checklist: Patient identified, Emergency Drugs available, Suction available and Patient being monitored Patient Re-evaluated:Patient Re-evaluated prior to induction Oxygen Delivery Method: Circle system utilized Preoxygenation: Pre-oxygenation with 100% oxygen Induction Type: IV induction LMA Size: 5.0 Number of attempts: 1 Placement Confirmation: positive ETCO2 and breath sounds checked- equal and bilateral Tube secured with: Tape Dental Injury: Teeth and Oropharynx as per pre-operative assessment

## 2019-12-02 NOTE — Anesthesia Postprocedure Evaluation (Signed)
Anesthesia Post Note  Patient: Christian Dalton  Procedure(s) Performed: [place] TRANSPOSITION LEFT FIRST STAGE (Left Arm Upper)     Patient location during evaluation: PACU Anesthesia Type: General Level of consciousness: awake and alert Pain management: pain level controlled Vital Signs Assessment: post-procedure vital signs reviewed and stable Respiratory status: spontaneous breathing, nonlabored ventilation and respiratory function stable Cardiovascular status: blood pressure returned to baseline and stable Postop Assessment: no apparent nausea or vomiting Anesthetic complications: no    Last Vitals:  Vitals:   12/02/19 1027 12/02/19 1041  BP: 125/80 121/83  Pulse: 84 88  Resp: 10 20  Temp: 36.9 C   SpO2: 100% 97%    Last Pain:  Vitals:   12/02/19 1027  PainSc: 0-No pain                 Audry Pili

## 2019-12-02 NOTE — Discharge Instructions (Addendum)
° °  [facility] [place] ° °Discharge Instructions ° °AV Fistula or Graft Surgery for Dialysis Access ° °Please refer to the following instructions for your post-procedure care. Your surgeon or physician assistant will discuss any changes with you. ° °Activity ° °You may drive the day following your surgery, if you are comfortable and no longer taking prescription pain medication. Resume full activity as the soreness in your incision resolves. ° °Bathing/Showering ° °You may shower after you go home. Keep your incision dry for 48 hours. Do not soak in a bathtub, hot tub, or swim until the incision heals completely. You may not shower if you have a hemodialysis catheter. ° °Incision Care ° °Clean your incision with mild soap and water after 48 hours. Pat the area dry with a clean towel. You do not need a bandage unless otherwise instructed. Do not apply any ointments or creams to your incision. You may have skin glue on your incision. Do not peel it off. It will come off on its own in about one week. Your arm may swell a bit after surgery. To reduce swelling use pillows to elevate your arm so it is above your heart. Your doctor will tell you if you need to lightly wrap your arm with an ACE bandage. ° °Diet ° °Resume your normal diet. There are not special food restrictions following this procedure. In order to heal from your surgery, it is CRITICAL to get adequate nutrition. Your body requires vitamins, minerals, and protein. Vegetables are the best source of vitamins and minerals. Vegetables also provide the perfect balance of protein. Processed food has little nutritional value, so try to avoid this. ° °Medications ° °Resume taking all of your medications. If your incision is causing pain, you may take over-the counter pain relievers such as acetaminophen (Tylenol). If you were prescribed a stronger pain medication, please be aware these medications can cause nausea and constipation. Prevent  nausea by taking the medication with a snack or meal. Avoid constipation by drinking plenty of fluids and eating foods with high amount of fiber, such as fruits, vegetables, and grains. Do not take Tylenol if you are taking prescription pain medications. ° ° ° ° °Follow up °Your surgeon may want to see you in the office following your access surgery. If so, this will be arranged at the time of your surgery. ° °Please call us immediately for any of the following conditions: ° °Increased pain, redness, drainage (pus) from your incision site °Fever of 101 degrees or higher °Severe or worsening pain at your incision site °Hand pain or numbness. ° °Reduce your risk of vascular disease: ° °Stop smoking. If you would like help, call [facility] at 1-800-QUIT-NOW (1-800-784-8669) or [place] at 336-586-4000 ° °Manage your cholesterol °Maintain a desired weight °Control your diabetes °Keep your blood pressure down ° °Dialysis ° °It will take several weeks to several months for your new dialysis access to be ready for use. Your surgeon will determine when it is OK to use it. Your nephrologist will continue to direct your dialysis. You can continue to use your Permcath until your new access is ready for use. ° °If you have any questions, please call the office at [phone]. ° °

## 2019-12-02 NOTE — Op Note (Signed)
    Patient name: Casen Pryor MRN: [id number] DOB: 1941/07/27 Sex: male  12/02/2019 Pre-operative Diagnosis: End-stage renal disease Post-operative diagnosis:  Same Surgeon:  [name] Assistants: Laurence Slate Procedure:   For staged left basilic vein fistula creation Anesthesia: General Blood Loss: Minimal Specimens: None  Findings: 6 mm artery, 3 mm vein  Indications: The patient recently underwent ligation of an occluded cephalic vein fistula that had an open wound and was draining.  He came back to the office for discussions of new access.  He currently has a catheter in place.  He had an adequate basilic vein.  We discussed proceeding with a for staged basilic vein fistula.  Procedure:  The patient was identified in the holding area and taken to [place] 16  The patient was then placed supine on the table. general anesthesia was administered.  The patient was prepped and draped in the usual sterile fashion.  A time out was called and antibiotics were administered.  Ultrasound was used to evaluate basilic vein in the upper arm.  There was a major branch just above the antecubital crease.  I made a transverse incision just above antecubital crease.  I first dissected out the brachial artery which was a healthy 6 mm artery.  It was mobilized proximally and distally encircled with Vesseloops.  I then identified the median basilic vein branch.  This appeared to be adequate measuring about 3-3.5 mm.  It was fully mobilized and marked for orientation.  It was then ligated distally with a 2-0 silk tie.  It flushed nicely with heparin saline.  Next, the brachial artery was occluded with vascular clamps #11 lesion make an arteriotomy which was extended longitudinally with Potts scissors.  The vein was cut the appropriate length and spatulated to fit set of the arteriotomy.  A running anastomosis was created 6-0 Prolene.  Prior to completion the appropriate flushing maneuvers were performed the  anastomosis was completed.  At the completion, the patient had a palpable left radial pulse and a brisk Doppler signal within the basilic vein.  We inspected the course of the basilic vein to make sure there were no obvious branches and no kinks.  Hemostasis was then achieved.  The incision was closed with 2 layers of 3-0 Vicryl followed by Dermabond.  There are no immediate complications.   Disposition: To PACU stable.   Theotis Burrow, M.D., [facility] [place] Office: [phone] Pager:  [phone]

## 2019-12-02 NOTE — Transfer of Care (Signed)
Immediate Anesthesia Transfer of Care Note  Patient: Christian Dalton  Procedure(s) Performed: BASCILIC VEIN TRANSPOSITION LEFT FIRST STAGE (Left Arm Upper)  Patient Location: PACU  Anesthesia Type:General  Level of Consciousness: awake, alert  and oriented  Airway & Oxygen Therapy: Patient Spontanous Breathing  Post-op Assessment: Report given to RN, Post -op Vital signs reviewed and stable and Patient moving all extremities  Post vital signs: Reviewed and stable  Last Vitals:  Vitals Value Taken Time  BP 125/80 12/02/19 1027  Temp 36.9 C 12/02/19 1027  Pulse 84 12/02/19 1032  Resp 13 12/02/19 1032  SpO2 98 % 12/02/19 1032  Vitals shown include unvalidated device data.  Last Pain:  Vitals:   12/02/19 1027  PainSc: 0-No pain      Patients Stated Pain Goal: 4 (08/31/11 1975)  Complications: No apparent anesthesia complications

## 2019-12-03 DIAGNOSIS — E1121 Type 2 diabetes mellitus with diabetic nephropathy: Secondary | ICD-10-CM | POA: Diagnosis not present

## 2019-12-03 DIAGNOSIS — N2581 Secondary hyperparathyroidism of renal origin: Secondary | ICD-10-CM | POA: Diagnosis not present

## 2019-12-03 DIAGNOSIS — N186 End stage renal disease: Secondary | ICD-10-CM | POA: Diagnosis not present

## 2019-12-03 DIAGNOSIS — Z992 Dependence on renal dialysis: Secondary | ICD-10-CM | POA: Diagnosis not present

## 2019-12-06 DIAGNOSIS — E1121 Type 2 diabetes mellitus with diabetic nephropathy: Secondary | ICD-10-CM | POA: Diagnosis not present

## 2019-12-06 DIAGNOSIS — Z992 Dependence on renal dialysis: Secondary | ICD-10-CM | POA: Diagnosis not present

## 2019-12-06 DIAGNOSIS — N2581 Secondary hyperparathyroidism of renal origin: Secondary | ICD-10-CM | POA: Diagnosis not present

## 2019-12-06 DIAGNOSIS — N186 End stage renal disease: Secondary | ICD-10-CM | POA: Diagnosis not present

## 2019-12-08 DIAGNOSIS — N186 End stage renal disease: Secondary | ICD-10-CM | POA: Diagnosis not present

## 2019-12-08 DIAGNOSIS — E1121 Type 2 diabetes mellitus with diabetic nephropathy: Secondary | ICD-10-CM | POA: Diagnosis not present

## 2019-12-08 DIAGNOSIS — N2581 Secondary hyperparathyroidism of renal origin: Secondary | ICD-10-CM | POA: Diagnosis not present

## 2019-12-08 DIAGNOSIS — Z992 Dependence on renal dialysis: Secondary | ICD-10-CM | POA: Diagnosis not present

## 2019-12-10 DIAGNOSIS — Z992 Dependence on renal dialysis: Secondary | ICD-10-CM | POA: Diagnosis not present

## 2019-12-10 DIAGNOSIS — N186 End stage renal disease: Secondary | ICD-10-CM | POA: Diagnosis not present

## 2019-12-10 DIAGNOSIS — N2581 Secondary hyperparathyroidism of renal origin: Secondary | ICD-10-CM | POA: Diagnosis not present

## 2019-12-10 DIAGNOSIS — E1121 Type 2 diabetes mellitus with diabetic nephropathy: Secondary | ICD-10-CM | POA: Diagnosis not present

## 2019-12-12 ENCOUNTER — Encounter: Payer: Self-pay | Admitting: *Deleted

## 2019-12-13 DIAGNOSIS — E1121 Type 2 diabetes mellitus with diabetic nephropathy: Secondary | ICD-10-CM | POA: Diagnosis not present

## 2019-12-13 DIAGNOSIS — N186 End stage renal disease: Secondary | ICD-10-CM | POA: Diagnosis not present

## 2019-12-13 DIAGNOSIS — N2581 Secondary hyperparathyroidism of renal origin: Secondary | ICD-10-CM | POA: Diagnosis not present

## 2019-12-13 DIAGNOSIS — Z992 Dependence on renal dialysis: Secondary | ICD-10-CM | POA: Diagnosis not present

## 2019-12-15 DIAGNOSIS — N2581 Secondary hyperparathyroidism of renal origin: Secondary | ICD-10-CM | POA: Diagnosis not present

## 2019-12-15 DIAGNOSIS — E1121 Type 2 diabetes mellitus with diabetic nephropathy: Secondary | ICD-10-CM | POA: Diagnosis not present

## 2019-12-15 DIAGNOSIS — N186 End stage renal disease: Secondary | ICD-10-CM | POA: Diagnosis not present

## 2019-12-15 DIAGNOSIS — Z992 Dependence on renal dialysis: Secondary | ICD-10-CM | POA: Diagnosis not present

## 2019-12-17 DIAGNOSIS — N186 End stage renal disease: Secondary | ICD-10-CM | POA: Diagnosis not present

## 2019-12-17 DIAGNOSIS — E1121 Type 2 diabetes mellitus with diabetic nephropathy: Secondary | ICD-10-CM | POA: Diagnosis not present

## 2019-12-17 DIAGNOSIS — Z992 Dependence on renal dialysis: Secondary | ICD-10-CM | POA: Diagnosis not present

## 2019-12-17 DIAGNOSIS — N2581 Secondary hyperparathyroidism of renal origin: Secondary | ICD-10-CM | POA: Diagnosis not present

## 2019-12-20 DIAGNOSIS — N186 End stage renal disease: Secondary | ICD-10-CM | POA: Diagnosis not present

## 2019-12-20 DIAGNOSIS — N2581 Secondary hyperparathyroidism of renal origin: Secondary | ICD-10-CM | POA: Diagnosis not present

## 2019-12-20 DIAGNOSIS — E1121 Type 2 diabetes mellitus with diabetic nephropathy: Secondary | ICD-10-CM | POA: Diagnosis not present

## 2019-12-20 DIAGNOSIS — Z992 Dependence on renal dialysis: Secondary | ICD-10-CM | POA: Diagnosis not present

## 2019-12-22 DIAGNOSIS — Z992 Dependence on renal dialysis: Secondary | ICD-10-CM | POA: Diagnosis not present

## 2019-12-22 DIAGNOSIS — E1121 Type 2 diabetes mellitus with diabetic nephropathy: Secondary | ICD-10-CM | POA: Diagnosis not present

## 2019-12-22 DIAGNOSIS — N2581 Secondary hyperparathyroidism of renal origin: Secondary | ICD-10-CM | POA: Diagnosis not present

## 2019-12-22 DIAGNOSIS — N186 End stage renal disease: Secondary | ICD-10-CM | POA: Diagnosis not present

## 2019-12-24 DIAGNOSIS — N2581 Secondary hyperparathyroidism of renal origin: Secondary | ICD-10-CM | POA: Diagnosis not present

## 2019-12-24 DIAGNOSIS — Z992 Dependence on renal dialysis: Secondary | ICD-10-CM | POA: Diagnosis not present

## 2019-12-24 DIAGNOSIS — E1121 Type 2 diabetes mellitus with diabetic nephropathy: Secondary | ICD-10-CM | POA: Diagnosis not present

## 2019-12-24 DIAGNOSIS — N186 End stage renal disease: Secondary | ICD-10-CM | POA: Diagnosis not present

## 2019-12-26 DIAGNOSIS — E1122 Type 2 diabetes mellitus with diabetic chronic kidney disease: Secondary | ICD-10-CM | POA: Diagnosis not present

## 2019-12-26 DIAGNOSIS — N186 End stage renal disease: Secondary | ICD-10-CM | POA: Diagnosis not present

## 2019-12-26 DIAGNOSIS — Z992 Dependence on renal dialysis: Secondary | ICD-10-CM | POA: Diagnosis not present

## 2019-12-27 DIAGNOSIS — N186 End stage renal disease: Secondary | ICD-10-CM | POA: Diagnosis not present

## 2019-12-27 DIAGNOSIS — E1121 Type 2 diabetes mellitus with diabetic nephropathy: Secondary | ICD-10-CM | POA: Diagnosis not present

## 2019-12-27 DIAGNOSIS — Z992 Dependence on renal dialysis: Secondary | ICD-10-CM | POA: Diagnosis not present

## 2019-12-27 DIAGNOSIS — N2581 Secondary hyperparathyroidism of renal origin: Secondary | ICD-10-CM | POA: Diagnosis not present

## 2019-12-29 DIAGNOSIS — E1121 Type 2 diabetes mellitus with diabetic nephropathy: Secondary | ICD-10-CM | POA: Diagnosis not present

## 2019-12-29 DIAGNOSIS — Z992 Dependence on renal dialysis: Secondary | ICD-10-CM | POA: Diagnosis not present

## 2019-12-29 DIAGNOSIS — N186 End stage renal disease: Secondary | ICD-10-CM | POA: Diagnosis not present

## 2019-12-29 DIAGNOSIS — N2581 Secondary hyperparathyroidism of renal origin: Secondary | ICD-10-CM | POA: Diagnosis not present

## 2019-12-31 DIAGNOSIS — E1121 Type 2 diabetes mellitus with diabetic nephropathy: Secondary | ICD-10-CM | POA: Diagnosis not present

## 2019-12-31 DIAGNOSIS — Z992 Dependence on renal dialysis: Secondary | ICD-10-CM | POA: Diagnosis not present

## 2019-12-31 DIAGNOSIS — N186 End stage renal disease: Secondary | ICD-10-CM | POA: Diagnosis not present

## 2019-12-31 DIAGNOSIS — N2581 Secondary hyperparathyroidism of renal origin: Secondary | ICD-10-CM | POA: Diagnosis not present

## 2020-01-02 DIAGNOSIS — Z992 Dependence on renal dialysis: Secondary | ICD-10-CM | POA: Diagnosis not present

## 2020-01-02 DIAGNOSIS — E782 Mixed hyperlipidemia: Secondary | ICD-10-CM | POA: Diagnosis not present

## 2020-01-02 DIAGNOSIS — N186 End stage renal disease: Secondary | ICD-10-CM | POA: Diagnosis not present

## 2020-01-02 DIAGNOSIS — I447 Left bundle-branch block, unspecified: Secondary | ICD-10-CM | POA: Diagnosis not present

## 2020-01-02 DIAGNOSIS — I2510 Atherosclerotic heart disease of native coronary artery without angina pectoris: Secondary | ICD-10-CM | POA: Diagnosis not present

## 2020-01-03 DIAGNOSIS — E1121 Type 2 diabetes mellitus with diabetic nephropathy: Secondary | ICD-10-CM | POA: Diagnosis not present

## 2020-01-03 DIAGNOSIS — Z992 Dependence on renal dialysis: Secondary | ICD-10-CM | POA: Diagnosis not present

## 2020-01-03 DIAGNOSIS — N186 End stage renal disease: Secondary | ICD-10-CM | POA: Diagnosis not present

## 2020-01-03 DIAGNOSIS — N2581 Secondary hyperparathyroidism of renal origin: Secondary | ICD-10-CM | POA: Diagnosis not present

## 2020-01-05 DIAGNOSIS — E1121 Type 2 diabetes mellitus with diabetic nephropathy: Secondary | ICD-10-CM | POA: Diagnosis not present

## 2020-01-05 DIAGNOSIS — N186 End stage renal disease: Secondary | ICD-10-CM | POA: Diagnosis not present

## 2020-01-05 DIAGNOSIS — Z992 Dependence on renal dialysis: Secondary | ICD-10-CM | POA: Diagnosis not present

## 2020-01-05 DIAGNOSIS — N2581 Secondary hyperparathyroidism of renal origin: Secondary | ICD-10-CM | POA: Diagnosis not present

## 2020-01-06 ENCOUNTER — Telehealth (HOSPITAL_COMMUNITY): Payer: Self-pay

## 2020-01-06 NOTE — Telephone Encounter (Signed)

## 2020-01-07 DIAGNOSIS — N2581 Secondary hyperparathyroidism of renal origin: Secondary | ICD-10-CM | POA: Diagnosis not present

## 2020-01-07 DIAGNOSIS — Z992 Dependence on renal dialysis: Secondary | ICD-10-CM | POA: Diagnosis not present

## 2020-01-07 DIAGNOSIS — E1121 Type 2 diabetes mellitus with diabetic nephropathy: Secondary | ICD-10-CM | POA: Diagnosis not present

## 2020-01-07 DIAGNOSIS — N186 End stage renal disease: Secondary | ICD-10-CM | POA: Diagnosis not present

## 2020-01-09 ENCOUNTER — Ambulatory Visit (HOSPITAL_COMMUNITY): Payer: Medicare Other

## 2020-01-10 DIAGNOSIS — Z992 Dependence on renal dialysis: Secondary | ICD-10-CM | POA: Diagnosis not present

## 2020-01-10 DIAGNOSIS — E1121 Type 2 diabetes mellitus with diabetic nephropathy: Secondary | ICD-10-CM | POA: Diagnosis not present

## 2020-01-10 DIAGNOSIS — N2581 Secondary hyperparathyroidism of renal origin: Secondary | ICD-10-CM | POA: Diagnosis not present

## 2020-01-10 DIAGNOSIS — N186 End stage renal disease: Secondary | ICD-10-CM | POA: Diagnosis not present

## 2020-01-14 DIAGNOSIS — N186 End stage renal disease: Secondary | ICD-10-CM | POA: Diagnosis not present

## 2020-01-14 DIAGNOSIS — N2581 Secondary hyperparathyroidism of renal origin: Secondary | ICD-10-CM | POA: Diagnosis not present

## 2020-01-14 DIAGNOSIS — Z992 Dependence on renal dialysis: Secondary | ICD-10-CM | POA: Diagnosis not present

## 2020-01-14 DIAGNOSIS — E1121 Type 2 diabetes mellitus with diabetic nephropathy: Secondary | ICD-10-CM | POA: Diagnosis not present

## 2020-01-17 DIAGNOSIS — N186 End stage renal disease: Secondary | ICD-10-CM | POA: Diagnosis not present

## 2020-01-17 DIAGNOSIS — E1121 Type 2 diabetes mellitus with diabetic nephropathy: Secondary | ICD-10-CM | POA: Diagnosis not present

## 2020-01-17 DIAGNOSIS — N2581 Secondary hyperparathyroidism of renal origin: Secondary | ICD-10-CM | POA: Diagnosis not present

## 2020-01-17 DIAGNOSIS — Z992 Dependence on renal dialysis: Secondary | ICD-10-CM | POA: Diagnosis not present

## 2020-01-18 IMAGING — US US ABDOMEN LIMITED
1 series · 14 of 25 positions shown · non-contrast
Comparison: 11/25/2018

CLINICAL DATA: S/P laparoscopic cholecystectomy

EXAM:
ULTRASOUND ABDOMEN LIMITED RIGHT UPPER QUADRANT

[Series 1: us abdomen limited · 0.33mm/px · 14 of 39 slices shown]
[im 1/39]
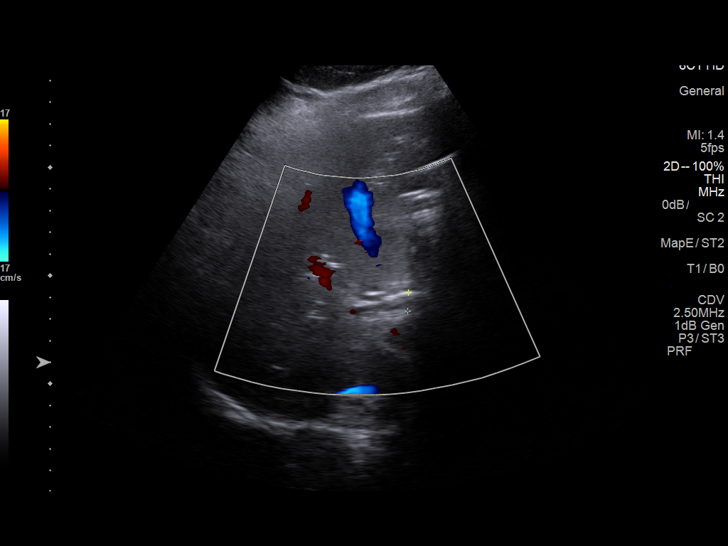
[im 4/39]
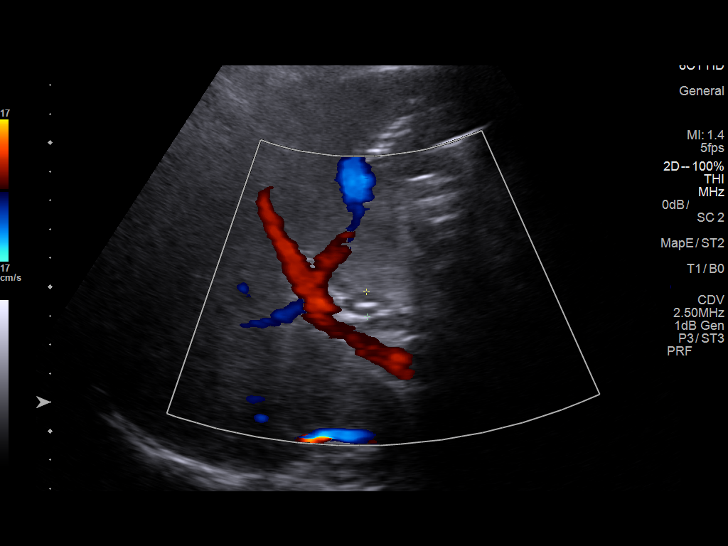
[im 7/39]
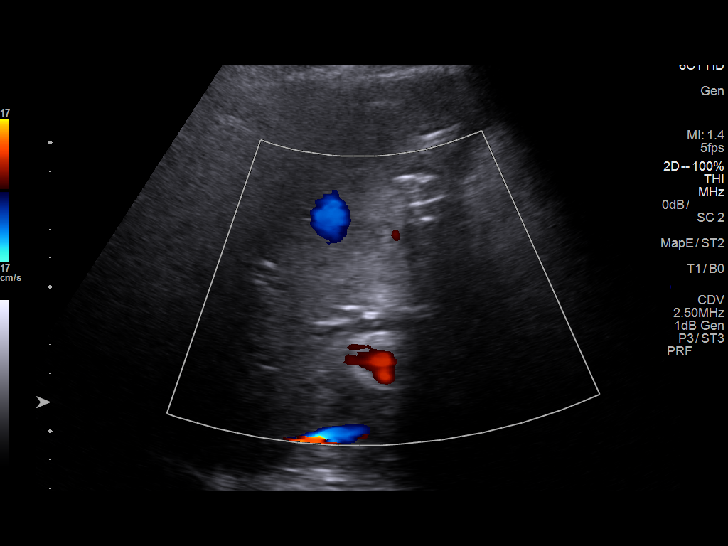
[im 10/39]
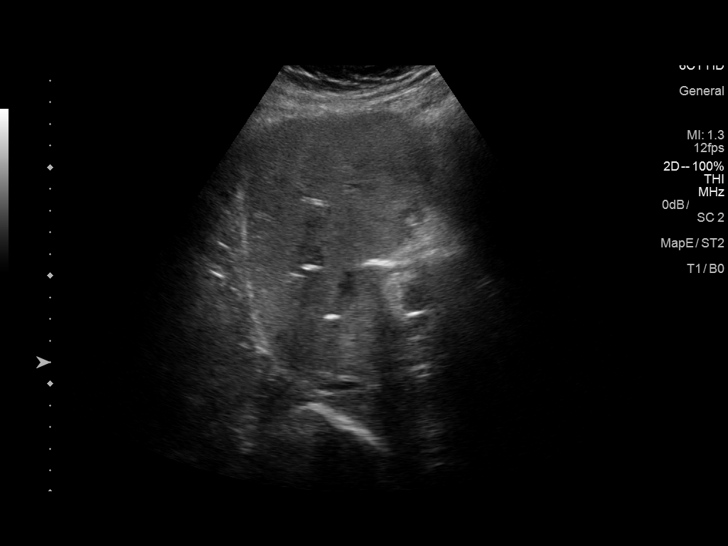
[im 13/39]
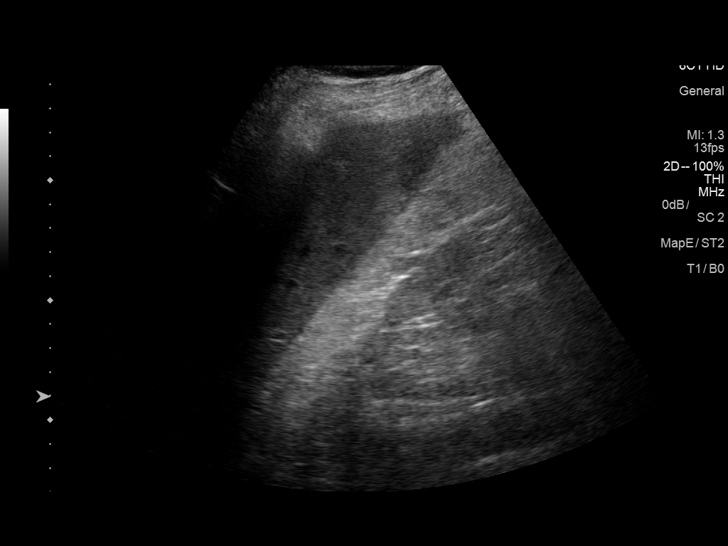
[im 15/39]
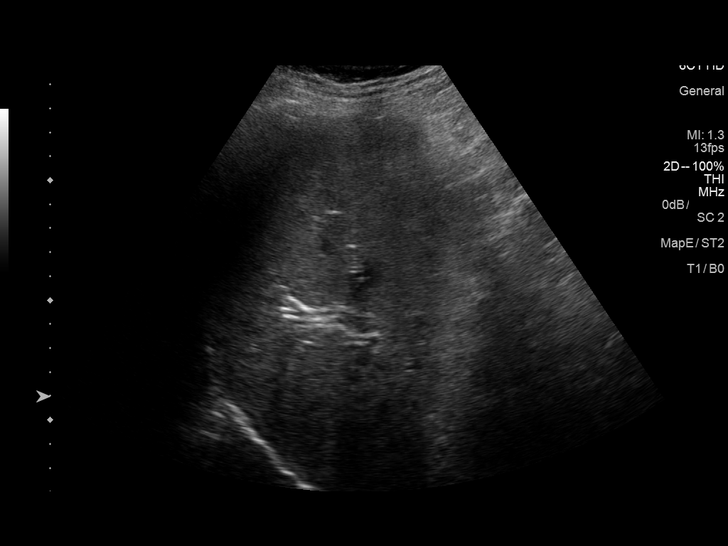
[im 18/39]
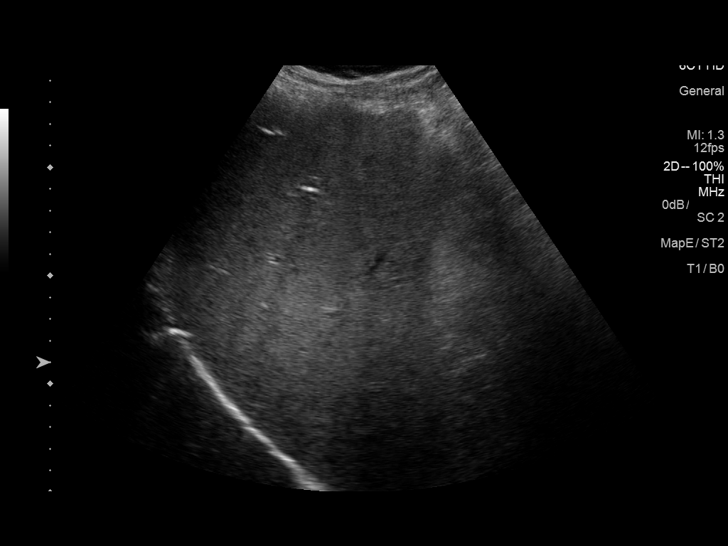
[im 21/39]
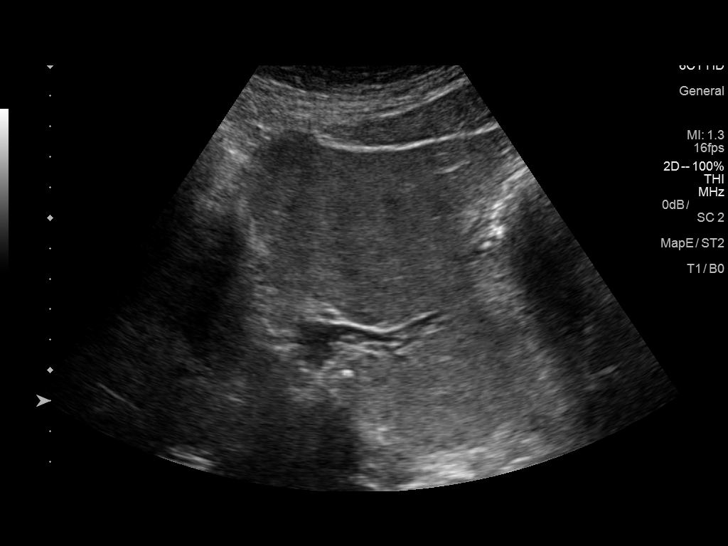
[im 24/39]
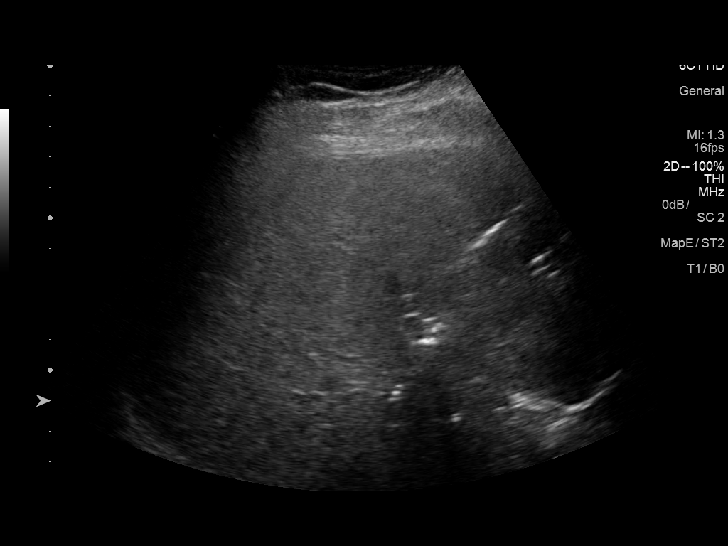
[im 26/39]
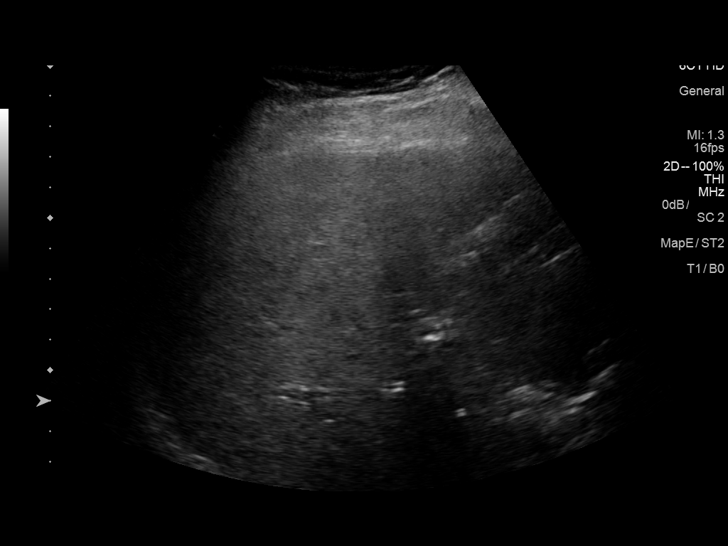
[im 29/39]
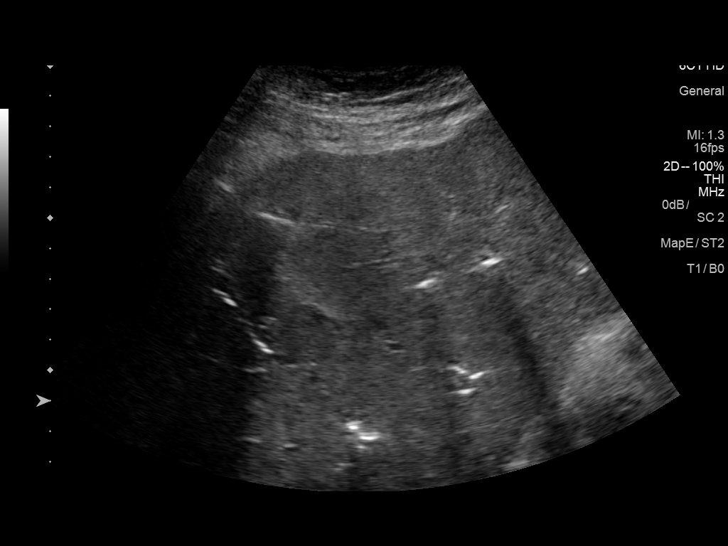
[im 32/39]
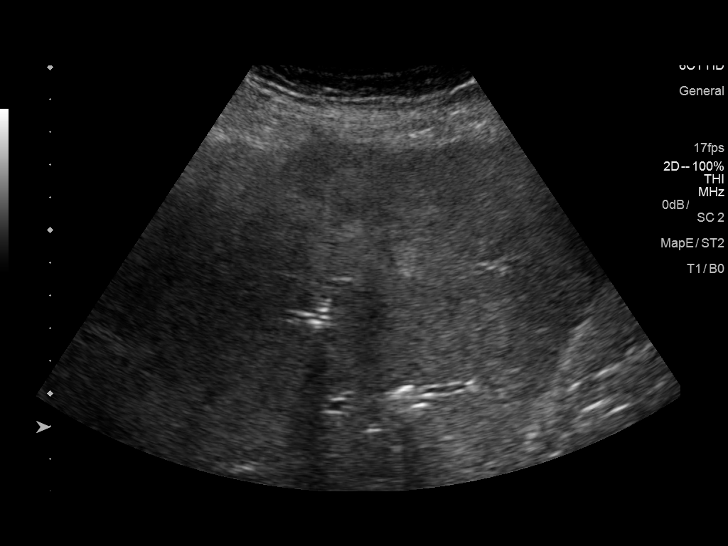
[im 35/39]
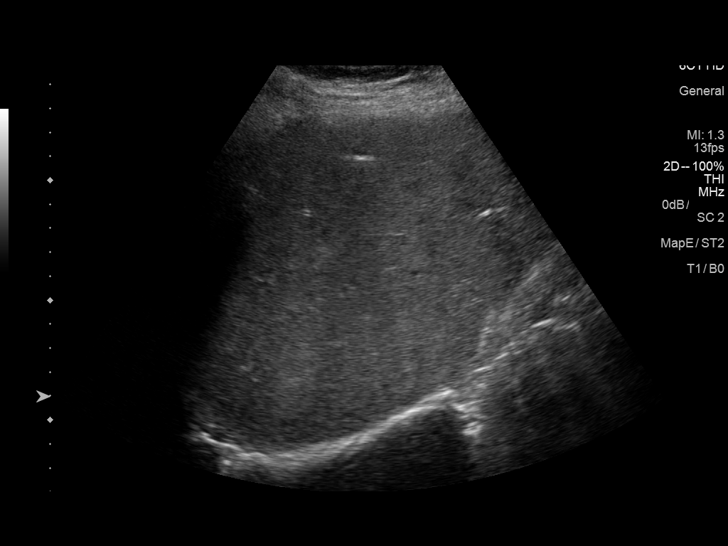
[im 39/39]
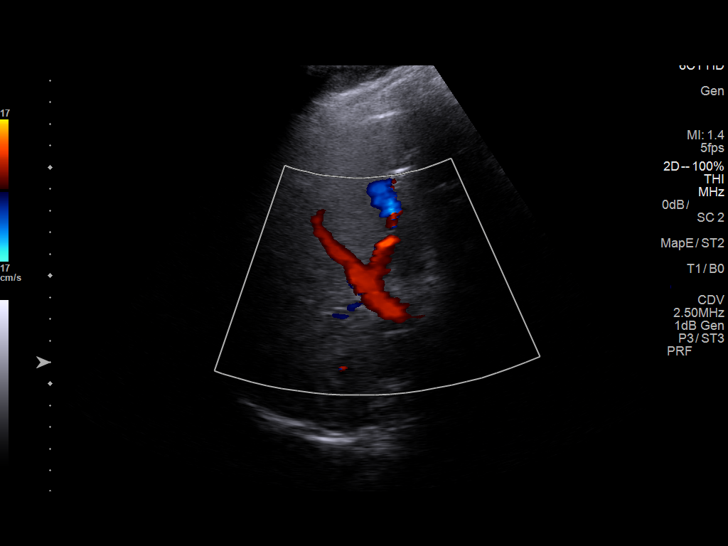

[14 of 25 positions shown; findings below may reference images not displayed]

FINDINGS: Gallbladder:

Surgically absent

Common bile duct:

Diameter: Up to 8.6 mm. Linear intraluminal opacity consistent with
biliary stent as seen on prior radiograph.

Liver:

No focal lesion identified. Within normal limits in parenchymal
echogenicity. Scattered pneumobilia consistent with patent biliary
stent. Portal vein is patent on color Doppler imaging with normal
direction of blood flow towards the liver.
IMPRESSION: 1. Biliary stent with scattered pneumobilia indicating patency. No
acute findings.

## 2020-01-19 DIAGNOSIS — Z992 Dependence on renal dialysis: Secondary | ICD-10-CM | POA: Diagnosis not present

## 2020-01-19 DIAGNOSIS — N2581 Secondary hyperparathyroidism of renal origin: Secondary | ICD-10-CM | POA: Diagnosis not present

## 2020-01-19 DIAGNOSIS — E1121 Type 2 diabetes mellitus with diabetic nephropathy: Secondary | ICD-10-CM | POA: Diagnosis not present

## 2020-01-19 DIAGNOSIS — N186 End stage renal disease: Secondary | ICD-10-CM | POA: Diagnosis not present

## 2020-01-21 DIAGNOSIS — N2581 Secondary hyperparathyroidism of renal origin: Secondary | ICD-10-CM | POA: Diagnosis not present

## 2020-01-21 DIAGNOSIS — E1121 Type 2 diabetes mellitus with diabetic nephropathy: Secondary | ICD-10-CM | POA: Diagnosis not present

## 2020-01-21 DIAGNOSIS — Z992 Dependence on renal dialysis: Secondary | ICD-10-CM | POA: Diagnosis not present

## 2020-01-21 DIAGNOSIS — N186 End stage renal disease: Secondary | ICD-10-CM | POA: Diagnosis not present

## 2020-01-23 DIAGNOSIS — Z992 Dependence on renal dialysis: Secondary | ICD-10-CM | POA: Diagnosis not present

## 2020-01-23 DIAGNOSIS — N186 End stage renal disease: Secondary | ICD-10-CM | POA: Diagnosis not present

## 2020-01-23 DIAGNOSIS — E1122 Type 2 diabetes mellitus with diabetic chronic kidney disease: Secondary | ICD-10-CM | POA: Diagnosis not present

## 2020-01-24 DIAGNOSIS — Z992 Dependence on renal dialysis: Secondary | ICD-10-CM | POA: Diagnosis not present

## 2020-01-24 DIAGNOSIS — N2581 Secondary hyperparathyroidism of renal origin: Secondary | ICD-10-CM | POA: Diagnosis not present

## 2020-01-24 DIAGNOSIS — E1121 Type 2 diabetes mellitus with diabetic nephropathy: Secondary | ICD-10-CM | POA: Diagnosis not present

## 2020-01-24 DIAGNOSIS — N186 End stage renal disease: Secondary | ICD-10-CM | POA: Diagnosis not present

## 2020-01-26 DIAGNOSIS — N2581 Secondary hyperparathyroidism of renal origin: Secondary | ICD-10-CM | POA: Diagnosis not present

## 2020-01-26 DIAGNOSIS — Z992 Dependence on renal dialysis: Secondary | ICD-10-CM | POA: Diagnosis not present

## 2020-01-26 DIAGNOSIS — N186 End stage renal disease: Secondary | ICD-10-CM | POA: Diagnosis not present

## 2020-01-26 DIAGNOSIS — E1121 Type 2 diabetes mellitus with diabetic nephropathy: Secondary | ICD-10-CM | POA: Diagnosis not present

## 2020-01-28 DIAGNOSIS — N186 End stage renal disease: Secondary | ICD-10-CM | POA: Diagnosis not present

## 2020-01-28 DIAGNOSIS — Z992 Dependence on renal dialysis: Secondary | ICD-10-CM | POA: Diagnosis not present

## 2020-01-28 DIAGNOSIS — N2581 Secondary hyperparathyroidism of renal origin: Secondary | ICD-10-CM | POA: Diagnosis not present

## 2020-01-28 DIAGNOSIS — E1121 Type 2 diabetes mellitus with diabetic nephropathy: Secondary | ICD-10-CM | POA: Diagnosis not present

## 2020-01-31 DIAGNOSIS — E1121 Type 2 diabetes mellitus with diabetic nephropathy: Secondary | ICD-10-CM | POA: Diagnosis not present

## 2020-01-31 DIAGNOSIS — N186 End stage renal disease: Secondary | ICD-10-CM | POA: Diagnosis not present

## 2020-01-31 DIAGNOSIS — N2581 Secondary hyperparathyroidism of renal origin: Secondary | ICD-10-CM | POA: Diagnosis not present

## 2020-01-31 DIAGNOSIS — Z992 Dependence on renal dialysis: Secondary | ICD-10-CM | POA: Diagnosis not present

## 2020-02-02 ENCOUNTER — Other Ambulatory Visit: Payer: Self-pay | Admitting: *Deleted

## 2020-02-02 DIAGNOSIS — N186 End stage renal disease: Secondary | ICD-10-CM

## 2020-02-03 ENCOUNTER — Ambulatory Visit (INDEPENDENT_AMBULATORY_CARE_PROVIDER_SITE_OTHER): Payer: Self-pay | Admitting: Physician Assistant

## 2020-02-03 ENCOUNTER — Other Ambulatory Visit: Payer: Self-pay

## 2020-02-03 ENCOUNTER — Ambulatory Visit (HOSPITAL_COMMUNITY)
Admission: RE | Admit: 2020-02-03 | Discharge: 2020-02-03 | Disposition: A | Discharge status: Home / Self Care | Payer: Medicare Other | Source: Ambulatory Visit | Attending: Surgery | Admitting: Surgery

## 2020-02-03 VITALS — BP 138/75 | HR 68 | Temp 97.3°F | Resp 20 | Ht 70.0 in | Wt 220.0 lb

## 2020-02-03 DIAGNOSIS — N186 End stage renal disease: Secondary | ICD-10-CM | POA: Diagnosis not present

## 2020-02-03 DIAGNOSIS — Z992 Dependence on renal dialysis: Secondary | ICD-10-CM

## 2020-02-03 NOTE — H&P (View-Only) (Signed)
POST OPERATIVE OFFICE NOTE    CC:  F/u for surgery  HPI:  This is a 79 y.o. male who is s/p first stage left basilic vein fistula creation on December 02, 2019 by Dr. Trula Slade.  The patient presents today for follow-up.  He dialyzes via tunneled dialysis catheter on Tuesdays, Thursdays and Saturdays at [facility]. He denies hand pain, fever or chills  Allergies  Allergen Reactions  . Avelox [Moxifloxacin Hcl] Other (See Comments)    Hallucinations   . Gabapentin Other (See Comments)    Caused staggering and red feet  . Metoprolol Other (See Comments)    Dropped pulse too low; was taken off of this by MD  . Shellfish Allergy Nausea And Vomiting and Other (See Comments)    Made patient VERY nauseous and he developed stomach cramps  . Sulfa Antibiotics Other (See Comments)    Allergy is from childhood (reaction not recalled)  . Tetracyclines & Related Other (See Comments)    Blisters on hands and arms  . Adhesive [Tape] Itching and Rash    Paper tape only- skin cannot tolerate "heavy" tapes    Current Outpatient Medications  Medication Sig Dispense Refill  . aspirin EC 81 MG tablet Take 81 mg by mouth at bedtime.     Marland Kitchen atorvastatin (LIPITOR) 10 MG tablet Take 10 mg by mouth at bedtime.    Marland Kitchen buPROPion (WELLBUTRIN XL) 150 MG 24 hr tablet Take 150 mg by mouth at bedtime.     . docusate sodium (COLACE) 100 MG capsule Take 100 mg by mouth 2 (two) times daily.     Marland Kitchen escitalopram (LEXAPRO) 10 MG tablet Take 10 mg by mouth at bedtime.     . fluticasone (FLONASE) 50 MCG/ACT nasal spray Place 1 spray into both nostrils daily as needed for allergies or rhinitis.    Marland Kitchen insulin detemir (LEVEMIR) 100 UNIT/ML injection Inject 0.14 mLs (14 Units total) into the skin 2 (two) times daily. (Patient taking differently: Inject 10-50 Units into the skin 2 (two) times daily. ) 10 mL 11  . Insulin Lispro Prot & Lispro (HUMALOG MIX 75/25 KWIKPEN) (75-25) 100 UNIT/ML Kwikpen Take 8-30 Units by mouth 2  (two) times daily.     Marland Kitchen lactulose, encephalopathy, (CHRONULAC) 10 GM/15ML SOLN Take 15 mLs by mouth daily as needed (constipation).   4  . levothyroxine (SYNTHROID, LEVOTHROID) 125 MCG tablet Take 125 mcg by mouth daily before breakfast.     . lidocaine-prilocaine (EMLA) cream Apply 1 application topically daily as needed (port access).   4  . linaclotide (LINZESS) 290 MCG CAPS capsule Take 290 mcg by mouth every other day.     . loratadine (CLARITIN) 10 MG tablet Take 10 mg every evening by mouth.    . montelukast (SINGULAIR) 10 MG tablet Take 10 mg every evening by mouth.    . nitroGLYCERIN (NITROSTAT) 0.4 MG SL tablet Place 0.4 mg under the tongue every 5 (five) minutes as needed for chest pain.    Marland Kitchen oxyCODONE-acetaminophen (PERCOCET) 10-325 MG tablet Take 1 tablet by mouth every 4 (four) hours as needed for pain.     Vladimir Faster Glycol-Propyl Glycol (SYSTANE OP) Place 1 drop into both eyes daily as needed (dry eyes).    . polyethylene glycol powder (GLYCOLAX/MIRALAX) 17 GM/SCOOP powder Take 17 g by mouth every other day.     . sevelamer carbonate (RENVELA) 800 MG tablet Take 3 tablets (2,400 mg total) by mouth 3 (three) times daily with meals. (  Patient taking differently: Take 2,400 mg by mouth 2 (two) times daily with a meal. ) 45 tablet 0   No current facility-administered medications for this visit.     ROS:  See HPI  Physical Exam: Vitals:   02/03/20 1549  Weight: 220 lb (99.8 kg)  Height: 5\' 10"  (1.778 m)   Incision: Well-healed without signs of infection Extremities: Good bruit in fistula.  5 out of 5 grip strength.  2+ radial pulse Neuro: Alert and oriented x4 +--------------------+----------+-----------------+-----------------+  AVF         PSV (cm/s)Flow Vol (mL/min)  Comments     +--------------------+----------+-----------------+-----------------+  Native artery inflow  109      383    abnormal waveform    +--------------------+----------+-----------------+-----------------+  AVF Anastomosis     403                      +--------------------+----------+-----------------+-----------------+     +------------+----------+-------------+----------+---------------+  OUTFLOW VEINPSV (cm/s)Diameter (cm)Depth (cm)  Describe    +------------+----------+-------------+----------+---------------+  Prox UA     156    0.79     2.17  branch 0.552 cm  +------------+----------+-------------+----------+---------------+  Mid UA     127    1.00     1.52            +------------+----------+-------------+----------+---------------+  Dist UA     368    0.90     1.40            +------------+----------+-------------+----------+---------------+  AC Fossa    609    0.49        Retained valve   +------------+----------+-------------+----------+---------------+     Summary:  1. Patent AVF.  2. Retained valve in the ante cubitus with elevated velocities.  3. Branch at the axilla.  4. Retrograde distal radial artery.  5. Abnormal waveform and velocity in the inflow artery.    Assessment/Plan:  This is a 79 y.o. male who is s/p: First stage left basilic vein transposition arteriovenous fistula.  He has good development of the vein diameter.  We will plan second stage transposition with Dr. Trula Slade.  - Risa Grill, PA-C [facility] [phone]  Clinic MD:  Donzetta Matters

## 2020-02-03 NOTE — Progress Notes (Deleted)
POST OPERATIVE OFFICE NOTE    CC:  F/u for surgery  HPI:  This is a 79 y.o. male who is s/p 1st stage left BVT on 12/02/2019 by Dr. Trula Slade.  He presents today for follow up.    The pt *** have evidence of steal sx. The pt *** on dialysis.  Allergies  Allergen Reactions  . Avelox [Moxifloxacin Hcl] Other (See Comments)    Hallucinations   . Gabapentin Other (See Comments)    Caused staggering and red feet  . Metoprolol Other (See Comments)    Dropped pulse too low; was taken off of this by MD  . Shellfish Allergy Nausea And Vomiting and Other (See Comments)    Made patient VERY nauseous and he developed stomach cramps  . Sulfa Antibiotics Other (See Comments)    Allergy is from childhood (reaction not recalled)  . Tetracyclines & Related Other (See Comments)    Blisters on hands and arms  . Adhesive [Tape] Itching and Rash    Paper tape only- skin cannot tolerate "heavy" tapes    Current Outpatient Medications  Medication Sig Dispense Refill  . aspirin EC 81 MG tablet Take 81 mg by mouth at bedtime.     Marland Kitchen atorvastatin (LIPITOR) 10 MG tablet Take 10 mg by mouth at bedtime.    Marland Kitchen buPROPion (WELLBUTRIN XL) 150 MG 24 hr tablet Take 150 mg by mouth at bedtime.     . docusate sodium (COLACE) 100 MG capsule Take 100 mg by mouth 2 (two) times daily.     Marland Kitchen escitalopram (LEXAPRO) 10 MG tablet Take 10 mg by mouth at bedtime.     . fluticasone (FLONASE) 50 MCG/ACT nasal spray Place 1 spray into both nostrils daily as needed for allergies or rhinitis.    Marland Kitchen insulin detemir (LEVEMIR) 100 UNIT/ML injection Inject 0.14 mLs (14 Units total) into the skin 2 (two) times daily. (Patient taking differently: Inject 10-50 Units into the skin 2 (two) times daily. ) 10 mL 11  . Insulin Lispro Prot & Lispro (HUMALOG MIX 75/25 KWIKPEN) (75-25) 100 UNIT/ML Kwikpen Take 8-30 Units by mouth 2 (two) times daily.     Marland Kitchen lactulose, encephalopathy, (CHRONULAC) 10 GM/15ML SOLN Take 15 mLs by mouth daily as needed  (constipation).   4  . levothyroxine (SYNTHROID, LEVOTHROID) 125 MCG tablet Take 125 mcg by mouth daily before breakfast.     . lidocaine-prilocaine (EMLA) cream Apply 1 application topically daily as needed (port access).   4  . linaclotide (LINZESS) 290 MCG CAPS capsule Take 290 mcg by mouth every other day.     . loratadine (CLARITIN) 10 MG tablet Take 10 mg every evening by mouth.    . montelukast (SINGULAIR) 10 MG tablet Take 10 mg every evening by mouth.    . nitroGLYCERIN (NITROSTAT) 0.4 MG SL tablet Place 0.4 mg under the tongue every 5 (five) minutes as needed for chest pain.    Marland Kitchen oxyCODONE-acetaminophen (PERCOCET) 10-325 MG tablet Take 1 tablet by mouth every 4 (four) hours as needed for pain.     Vladimir Faster Glycol-Propyl Glycol (SYSTANE OP) Place 1 drop into both eyes daily as needed (dry eyes).    . polyethylene glycol powder (GLYCOLAX/MIRALAX) 17 GM/SCOOP powder Take 17 g by mouth every other day.     . sevelamer carbonate (RENVELA) 800 MG tablet Take 3 tablets (2,400 mg total) by mouth 3 (three) times daily with meals. (Patient taking differently: Take 2,400 mg by mouth 2 (two) times  daily with a meal. ) 45 tablet 0   No current facility-administered medications for this visit.     ROS:  See HPI  Physical Exam:  ***  Incision:  *** Extremities:  There *** a palpable *** pulse.  Motor and sensory *** in tact.  There *** a thrill/bruit present.   Dialysis Duplex on 02/03/2020: Diameter:  *** Depth:  ***   Assessment/Plan:  This is a 79 y.o. male who is s/p: 1st stage left BVT on 12/02/2019 by Dr. Trula Slade.  -the pt does *** have evidence of steal. -the fistula/graft can be used ***. -If pt has tunneled dialysis catheter and the access has been used successfully to the satisfaction of the dialysis center, the tunneled catheter can be removed at their discretion.   -the pt will follow up ***   Leontine Locket, PA-C [facility] [phone]  Clinic MD:   Donzetta Matters

## 2020-02-03 NOTE — Progress Notes (Signed)
POST OPERATIVE OFFICE NOTE    CC:  F/u for surgery  HPI:  This is a 79 y.o. male who is s/p first stage left basilic vein fistula creation on December 02, 2019 by Dr. Trula Slade.  The patient presents today for follow-up.  He dialyzes via tunneled dialysis catheter on Tuesdays, Thursdays and Saturdays at [facility]. He denies hand pain, fever or chills  Allergies  Allergen Reactions  . Avelox [Moxifloxacin Hcl] Other (See Comments)    Hallucinations   . Gabapentin Other (See Comments)    Caused staggering and red feet  . Metoprolol Other (See Comments)    Dropped pulse too low; was taken off of this by MD  . Shellfish Allergy Nausea And Vomiting and Other (See Comments)    Made patient VERY nauseous and he developed stomach cramps  . Sulfa Antibiotics Other (See Comments)    Allergy is from childhood (reaction not recalled)  . Tetracyclines & Related Other (See Comments)    Blisters on hands and arms  . Adhesive [Tape] Itching and Rash    Paper tape only- skin cannot tolerate "heavy" tapes    Current Outpatient Medications  Medication Sig Dispense Refill  . aspirin EC 81 MG tablet Take 81 mg by mouth at bedtime.     Marland Kitchen atorvastatin (LIPITOR) 10 MG tablet Take 10 mg by mouth at bedtime.    Marland Kitchen buPROPion (WELLBUTRIN XL) 150 MG 24 hr tablet Take 150 mg by mouth at bedtime.     . docusate sodium (COLACE) 100 MG capsule Take 100 mg by mouth 2 (two) times daily.     Marland Kitchen escitalopram (LEXAPRO) 10 MG tablet Take 10 mg by mouth at bedtime.     . fluticasone (FLONASE) 50 MCG/ACT nasal spray Place 1 spray into both nostrils daily as needed for allergies or rhinitis.    Marland Kitchen insulin detemir (LEVEMIR) 100 UNIT/ML injection Inject 0.14 mLs (14 Units total) into the skin 2 (two) times daily. (Patient taking differently: Inject 10-50 Units into the skin 2 (two) times daily. ) 10 mL 11  . Insulin Lispro Prot & Lispro (HUMALOG MIX 75/25 KWIKPEN) (75-25) 100 UNIT/ML Kwikpen Take 8-30 Units by mouth 2  (two) times daily.     Marland Kitchen lactulose, encephalopathy, (CHRONULAC) 10 GM/15ML SOLN Take 15 mLs by mouth daily as needed (constipation).   4  . levothyroxine (SYNTHROID, LEVOTHROID) 125 MCG tablet Take 125 mcg by mouth daily before breakfast.     . lidocaine-prilocaine (EMLA) cream Apply 1 application topically daily as needed (port access).   4  . linaclotide (LINZESS) 290 MCG CAPS capsule Take 290 mcg by mouth every other day.     . loratadine (CLARITIN) 10 MG tablet Take 10 mg every evening by mouth.    . montelukast (SINGULAIR) 10 MG tablet Take 10 mg every evening by mouth.    . nitroGLYCERIN (NITROSTAT) 0.4 MG SL tablet Place 0.4 mg under the tongue every 5 (five) minutes as needed for chest pain.    Marland Kitchen oxyCODONE-acetaminophen (PERCOCET) 10-325 MG tablet Take 1 tablet by mouth every 4 (four) hours as needed for pain.     Vladimir Faster Glycol-Propyl Glycol (SYSTANE OP) Place 1 drop into both eyes daily as needed (dry eyes).    . polyethylene glycol powder (GLYCOLAX/MIRALAX) 17 GM/SCOOP powder Take 17 g by mouth every other day.     . sevelamer carbonate (RENVELA) 800 MG tablet Take 3 tablets (2,400 mg total) by mouth 3 (three) times daily with meals. (  Patient taking differently: Take 2,400 mg by mouth 2 (two) times daily with a meal. ) 45 tablet 0   No current facility-administered medications for this visit.     ROS:  See HPI  Physical Exam: Vitals:   02/03/20 1549  Weight: 220 lb (99.8 kg)  Height: 5\' 10"  (1.778 m)   Incision: Well-healed without signs of infection Extremities: Good bruit in fistula.  5 out of 5 grip strength.  2+ radial pulse Neuro: Alert and oriented x4 +--------------------+----------+-----------------+-----------------+  AVF         PSV (cm/s)Flow Vol (mL/min)  Comments     +--------------------+----------+-----------------+-----------------+  Native artery inflow  109      383    abnormal waveform    +--------------------+----------+-----------------+-----------------+  AVF Anastomosis     403                      +--------------------+----------+-----------------+-----------------+     +------------+----------+-------------+----------+---------------+  OUTFLOW VEINPSV (cm/s)Diameter (cm)Depth (cm)  Describe    +------------+----------+-------------+----------+---------------+  Prox UA     156    0.79     2.17  branch 0.552 cm  +------------+----------+-------------+----------+---------------+  Mid UA     127    1.00     1.52            +------------+----------+-------------+----------+---------------+  Dist UA     368    0.90     1.40            +------------+----------+-------------+----------+---------------+  AC Fossa    609    0.49        Retained valve   +------------+----------+-------------+----------+---------------+     Summary:  1. Patent AVF.  2. Retained valve in the ante cubitus with elevated velocities.  3. Branch at the axilla.  4. Retrograde distal radial artery.  5. Abnormal waveform and velocity in the inflow artery.    Assessment/Plan:  This is a 79 y.o. male who is s/p: First stage left basilic vein transposition arteriovenous fistula.  He has good development of the vein diameter.  We will plan second stage transposition with Dr. Trula Slade.  - Risa Grill, PA-C [facility] [phone]  Clinic MD:  Donzetta Matters

## 2020-02-04 DIAGNOSIS — Z992 Dependence on renal dialysis: Secondary | ICD-10-CM | POA: Diagnosis not present

## 2020-02-04 DIAGNOSIS — N2581 Secondary hyperparathyroidism of renal origin: Secondary | ICD-10-CM | POA: Diagnosis not present

## 2020-02-04 DIAGNOSIS — E1121 Type 2 diabetes mellitus with diabetic nephropathy: Secondary | ICD-10-CM | POA: Diagnosis not present

## 2020-02-04 DIAGNOSIS — N186 End stage renal disease: Secondary | ICD-10-CM | POA: Diagnosis not present

## 2020-02-06 ENCOUNTER — Other Ambulatory Visit (HOSPITAL_COMMUNITY): Payer: Medicare Other

## 2020-02-06 DIAGNOSIS — M25561 Pain in right knee: Secondary | ICD-10-CM | POA: Diagnosis not present

## 2020-02-06 DIAGNOSIS — M47816 Spondylosis without myelopathy or radiculopathy, lumbar region: Secondary | ICD-10-CM | POA: Diagnosis not present

## 2020-02-07 ENCOUNTER — Other Ambulatory Visit: Payer: Self-pay

## 2020-02-07 DIAGNOSIS — Z992 Dependence on renal dialysis: Secondary | ICD-10-CM | POA: Diagnosis not present

## 2020-02-07 DIAGNOSIS — N186 End stage renal disease: Secondary | ICD-10-CM | POA: Diagnosis not present

## 2020-02-07 DIAGNOSIS — N2581 Secondary hyperparathyroidism of renal origin: Secondary | ICD-10-CM | POA: Diagnosis not present

## 2020-02-07 DIAGNOSIS — E1121 Type 2 diabetes mellitus with diabetic nephropathy: Secondary | ICD-10-CM | POA: Diagnosis not present

## 2020-02-08 ENCOUNTER — Other Ambulatory Visit (HOSPITAL_COMMUNITY)
Admission: RE | Admit: 2020-02-08 | Discharge: 2020-02-08 | Disposition: A | Discharge status: Home / Self Care | Payer: Medicare Other | Source: Ambulatory Visit | Attending: Surgery | Admitting: Surgery

## 2020-02-08 DIAGNOSIS — Z01812 Encounter for preprocedural laboratory examination: Secondary | ICD-10-CM | POA: Diagnosis not present

## 2020-02-08 DIAGNOSIS — Z20822 Contact with and (suspected) exposure to covid-19: Secondary | ICD-10-CM | POA: Diagnosis not present

## 2020-02-08 LAB — SARS CORONAVIRUS 2 (TAT 6-24 HRS): SARS Coronavirus 2: NEGATIVE

## 2020-02-09 ENCOUNTER — Encounter (HOSPITAL_COMMUNITY): Payer: Self-pay | Admitting: Surgery

## 2020-02-09 ENCOUNTER — Other Ambulatory Visit: Payer: Self-pay

## 2020-02-09 DIAGNOSIS — N186 End stage renal disease: Secondary | ICD-10-CM | POA: Diagnosis not present

## 2020-02-09 DIAGNOSIS — N2581 Secondary hyperparathyroidism of renal origin: Secondary | ICD-10-CM | POA: Diagnosis not present

## 2020-02-09 DIAGNOSIS — E1121 Type 2 diabetes mellitus with diabetic nephropathy: Secondary | ICD-10-CM | POA: Diagnosis not present

## 2020-02-09 DIAGNOSIS — Z992 Dependence on renal dialysis: Secondary | ICD-10-CM | POA: Diagnosis not present

## 2020-02-09 MED ORDER — VANCOMYCIN HCL 1500 MG/300ML IV SOLN
1500.0000 mg | INTRAVENOUS | Status: AC
  Administered 2020-02-10: 1500 mg via INTRAVENOUS
  Filled 2020-02-09 (×2): qty 300

## 2020-02-09 NOTE — Progress Notes (Signed)
SDW-pre-op call completed by pt spouse, [name], per pt request. Spouse denies that pt C/O SOB and chest pain. Spouse stated that pt is under the care of Dr. Abran Richard, Cardiology and Dr. Elvera Maria, PCP. Spouse made aware to have pt stop taking vitamins, fish oil and herbal medications. Do not take any NSAIDs ie: Ibuprofen, Advil, Naproxen (Aleve), Motrin, BC and Goody Powder. Spouse made aware to have pt take 70% SS of Humalog 75/25 insulin the night before surgery and no Humalog insulin DOS. Spouse made aware to have pt take 50% of SS of Levemir insulin the night before surgery and 50% the morning of surgery if CBG is > 70. Spouse made aware to have pt check CBG every 2 hours prior to arrival to hospital on DOS. Spouse made aware to have pt hold 50% of Levemir insulin for a CBG <70 on DOS and instead treat a CBG < 70 with 4 ounces of cranberry juice, wait 15 minutes after intervention to recheck CBG, if BG remains < 70, call Short Stay unit to speak with a nurse. Spouse verbalized understanding of all pre-op instructions. PA, Anesthesiology, asked to review ( and update ) pt cardiac history.

## 2020-02-09 NOTE — Progress Notes (Signed)
Anesthesia Chart Review: Same day workup  Recently underwent first stage of transposition  12/02/19 without issue.   Follows with cardiology at [facility] for hx of LBBB, CAD (mild nonobstructive by cath 2016). He was last seen 01/01/20, stable from CV standpoint, advised 42yr followup.   IDDMII well controlled by last A1c 6.4 on 04/08/19.   Will need DOS labs and eval.   EKG 04/08/19: Sinus rhythm with 1st degree A-V block. Rate 77. Left anterior fasicular block. Moderate voltage criteria for LVH, may be normal variant. T wave abnormality, consider lateral ischemia. Prolonged QT (QTc 495).  EKG was done during admission 04/07/19 for nausea and vomiting -  Per discharge summary "T wave versions on EKG-x1, resolved on repeat EKGs. Patient without any cardiac symptoms, no chest pain, dyspnea on exertion, any other limiting factors in his activity levels or any anginal symptoms. His troponin has remained negative." Patient had repeat EKG on 04/22/19 by PCP showing sinus rhythm, LAD, LVH - narrative in care everywhere, tracing requested.   TTE 10/04/18 (care everywhere): Summary Mild left ventricular hypertrophy Normal left ventricular systolic function with no segmental wall motion abnormalities. Ejection fraction is visually estimated at 14-70% Diastolic function appears normal There is mild aortic sclerosis noted, with no evidence of stenosis.  Wynonia Musty [facility] Short Stay Center/Anesthesiology Phone [phone] 02/09/2020 9:46 AM

## 2020-02-09 NOTE — Anesthesia Preprocedure Evaluation (Addendum)
Anesthesia Evaluation  Patient identified by MRN, date of birth, ID band Patient awake    Reviewed: Allergy & Precautions, NPO status , Patient's Chart, lab work & pertinent test results  Airway Mallampati: III  TM Distance: >3 FB Neck ROM: Full    Dental  (+) Edentulous Upper, Edentulous Lower   Pulmonary    breath sounds clear to auscultation       Cardiovascular hypertension,  Rhythm:Regular Rate:Normal     Neuro/Psych    GI/Hepatic   Endo/Other  diabetes  Renal/GU      Musculoskeletal   Abdominal (+) + obese,   Peds  Hematology   Anesthesia Other Findings   Reproductive/Obstetrics                           Anesthesia Physical Anesthesia Plan  ASA: III  Anesthesia Plan: General   Post-op Pain Management:    Induction: Intravenous  PONV Risk Score and Plan: Ondansetron  Airway Management Planned: LMA  Additional Equipment:   Intra-op Plan:   Post-operative Plan:   Informed Consent: I have reviewed the patients History and Physical, chart, labs and discussed the procedure including the risks, benefits and alternatives for the proposed anesthesia with the patient or authorized representative who has indicated his/her understanding and acceptance.       Plan Discussed with: CRNA and Anesthesiologist  Anesthesia Plan Comments: (Recently underwent first stage of transposition  12/02/19 without issue.   Follows with cardiology at [facility] for hx of LBBB, CAD (mild nonobstructive by cath 2016). He was last seen 01/01/20, stable from CV standpoint, advised 20yr followup.   IDDMII well controlled by last A1c 6.4 on 04/08/19.   Will need DOS labs and eval.   EKG 04/08/19: Sinus rhythm with 1st degree A-V block. Rate 77. Left anterior fasicular block. Moderate voltage criteria for LVH, may be normal variant. T wave abnormality, consider lateral ischemia. Prolonged QT (QTc 495).  EKG was  done during admission 04/07/19 for nausea and vomiting -  Per discharge summary "T wave versions on EKG-x1, resolved on repeat EKGs. Patient without any cardiac symptoms, no chest pain, dyspnea on exertion, any other limiting factors in his activity levels or any anginal symptoms. His troponin has remained negative." Patient had repeat EKG on 04/22/19 by PCP showing sinus rhythm, LAD, LVH - narrative in care everywhere, tracing requested.   TTE 10/04/18 (care everywhere): Summary Mild left ventricular hypertrophy Normal left ventricular systolic function with no segmental wall motion abnormalities. Ejection fraction is visually estimated at 89-21% Diastolic function appears normal There is mild aortic sclerosis noted, with no evidence of stenosis.)       Anesthesia Quick Evaluation

## 2020-02-10 ENCOUNTER — Encounter (HOSPITAL_COMMUNITY)
Admission: RE | Disposition: A | Discharge status: Home / Self Care | Payer: Self-pay | Source: Home / Self Care | Attending: Surgery

## 2020-02-10 ENCOUNTER — Ambulatory Visit (HOSPITAL_COMMUNITY)
Admission: RE | Admit: 2020-02-10 | Discharge: 2020-02-10 | Disposition: A | Discharge status: Home / Self Care | Payer: Medicare Other | Attending: Surgery | Admitting: Surgery

## 2020-02-10 ENCOUNTER — Encounter (HOSPITAL_COMMUNITY): Payer: Self-pay | Admitting: Surgery

## 2020-02-10 ENCOUNTER — Ambulatory Visit (HOSPITAL_COMMUNITY): Payer: Medicare Other | Admitting: Physician Assistant

## 2020-02-10 ENCOUNTER — Other Ambulatory Visit: Payer: Self-pay

## 2020-02-10 DIAGNOSIS — I1311 Hypertensive heart and chronic kidney disease without heart failure, with stage 5 chronic kidney disease, or end stage renal disease: Secondary | ICD-10-CM | POA: Diagnosis not present

## 2020-02-10 DIAGNOSIS — N186 End stage renal disease: Secondary | ICD-10-CM

## 2020-02-10 DIAGNOSIS — I2510 Atherosclerotic heart disease of native coronary artery without angina pectoris: Secondary | ICD-10-CM | POA: Diagnosis not present

## 2020-02-10 DIAGNOSIS — Z79899 Other long term (current) drug therapy: Secondary | ICD-10-CM | POA: Diagnosis not present

## 2020-02-10 DIAGNOSIS — E1122 Type 2 diabetes mellitus with diabetic chronic kidney disease: Secondary | ICD-10-CM | POA: Diagnosis not present

## 2020-02-10 DIAGNOSIS — Z7982 Long term (current) use of aspirin: Secondary | ICD-10-CM | POA: Diagnosis not present

## 2020-02-10 DIAGNOSIS — Z992 Dependence on renal dialysis: Secondary | ICD-10-CM

## 2020-02-10 DIAGNOSIS — N185 Chronic kidney disease, stage 5: Secondary | ICD-10-CM | POA: Diagnosis not present

## 2020-02-10 DIAGNOSIS — Z794 Long term (current) use of insulin: Secondary | ICD-10-CM | POA: Insufficient documentation

## 2020-02-10 DIAGNOSIS — Z79890 Hormone replacement therapy (postmenopausal): Secondary | ICD-10-CM | POA: Diagnosis not present

## 2020-02-10 DIAGNOSIS — I120 Hypertensive chronic kidney disease with stage 5 chronic kidney disease or end stage renal disease: Secondary | ICD-10-CM | POA: Diagnosis not present

## 2020-02-10 HISTORY — PX: BASCILIC VEIN TRANSPOSITION: SHX5742

## 2020-02-10 LAB — GLUCOSE, CAPILLARY
Glucose-Capillary: 115 mg/dL — ABNORMAL HIGH (ref 70–99)
Glucose-Capillary: 85 mg/dL (ref 70–99)

## 2020-02-10 LAB — POCT I-STAT, CHEM 8
BUN: 29 mg/dL — ABNORMAL HIGH (ref 8–23)
Calcium, Ion: 1.16 mmol/L (ref 1.15–1.40)
Chloride: 101 mmol/L (ref 98–111)
Creatinine, Ser: 7.4 mg/dL — ABNORMAL HIGH (ref 0.61–1.24)
Glucose, Bld: 125 mg/dL — ABNORMAL HIGH (ref 70–99)
HCT: 41 % (ref 39.0–52.0)
Hemoglobin: 13.9 g/dL (ref 13.0–17.0)
Potassium: 4 mmol/L (ref 3.5–5.1)
Sodium: 138 mmol/L (ref 135–145)
TCO2: 29 mmol/L (ref 22–32)

## 2020-02-10 SURGERY — TRANSPOSITION, VEIN, BASILIC
Anesthesia: General | Site: Arm Upper | Laterality: Left

## 2020-02-10 MED ORDER — HEMOSTATIC AGENTS (NO CHARGE) OPTIME
TOPICAL | Status: DC | PRN
  Administered 2020-02-10: 1 via TOPICAL

## 2020-02-10 MED ORDER — FENTANYL CITRATE (PF) 100 MCG/2ML IJ SOLN
25.0000 ug | INTRAMUSCULAR | Status: DC | PRN
  Administered 2020-02-10: 50 ug via INTRAVENOUS
  Administered 2020-02-10 (×2): 25 ug via INTRAVENOUS

## 2020-02-10 MED ORDER — FENTANYL CITRATE (PF) 250 MCG/5ML IJ SOLN
INTRAMUSCULAR | Status: AC
  Filled 2020-02-10: qty 5

## 2020-02-10 MED ORDER — CHLORHEXIDINE GLUCONATE 4 % EX LIQD: 60.0000 mL | Freq: Once | CUTANEOUS | Status: DC

## 2020-02-10 MED ORDER — PROPOFOL 10 MG/ML IV BOLUS
INTRAVENOUS | Status: DC | PRN
  Administered 2020-02-10: 20 mg via INTRAVENOUS
  Administered 2020-02-10: 180 mg via INTRAVENOUS

## 2020-02-10 MED ORDER — FENTANYL CITRATE (PF) 100 MCG/2ML IJ SOLN
INTRAMUSCULAR | Status: AC
  Filled 2020-02-10: qty 2

## 2020-02-10 MED ORDER — SODIUM CHLORIDE 0.9 % IV SOLN
INTRAVENOUS | Status: AC
  Filled 2020-02-10: qty 1.2

## 2020-02-10 MED ORDER — FENTANYL CITRATE (PF) 250 MCG/5ML IJ SOLN
INTRAMUSCULAR | Status: DC | PRN
  Administered 2020-02-10 (×2): 25 ug via INTRAVENOUS
  Administered 2020-02-10: 50 ug via INTRAVENOUS
  Administered 2020-02-10: 75 ug via INTRAVENOUS

## 2020-02-10 MED ORDER — PHENYLEPHRINE HCL-NACL 10-0.9 MG/250ML-% IV SOLN
INTRAVENOUS | Status: DC | PRN
  Administered 2020-02-10: 15 ug/min via INTRAVENOUS

## 2020-02-10 MED ORDER — SODIUM CHLORIDE 0.9 % IV SOLN
INTRAVENOUS | Status: DC | PRN
  Administered 2020-02-10: 500 mL

## 2020-02-10 MED ORDER — ONDANSETRON HCL 4 MG/2ML IJ SOLN
INTRAMUSCULAR | Status: DC | PRN
  Administered 2020-02-10: 4 mg via INTRAVENOUS

## 2020-02-10 MED ORDER — LIDOCAINE-EPINEPHRINE 1 %-1:100000 IJ SOLN
INTRAMUSCULAR | Status: AC
  Filled 2020-02-10: qty 1

## 2020-02-10 MED ORDER — LIDOCAINE 2% (20 MG/ML) 5 ML SYRINGE
INTRAMUSCULAR | Status: DC | PRN
  Administered 2020-02-10: 40 mg via INTRAVENOUS

## 2020-02-10 MED ORDER — ONDANSETRON HCL 4 MG/2ML IJ SOLN: 4.0000 mg | Freq: Once | INTRAMUSCULAR | Status: DC | PRN

## 2020-02-10 MED ORDER — 0.9 % SODIUM CHLORIDE (POUR BTL) OPTIME
TOPICAL | Status: DC | PRN
  Administered 2020-02-10: 1000 mL

## 2020-02-10 MED ORDER — PROPOFOL 10 MG/ML IV BOLUS
INTRAVENOUS | Status: AC
  Filled 2020-02-10: qty 20

## 2020-02-10 MED ORDER — SODIUM CHLORIDE 0.9 % IV SOLN: INTRAVENOUS | Status: DC

## 2020-02-10 MED ORDER — PHENYLEPHRINE HCL (PRESSORS) 10 MG/ML IV SOLN
INTRAVENOUS | Status: DC | PRN
  Administered 2020-02-10: 80 ug via INTRAVENOUS
  Administered 2020-02-10: 40 ug via INTRAVENOUS

## 2020-02-10 SURGICAL SUPPLY — 36 items
ARMBAND PINK RESTRICT EXTREMIT (MISCELLANEOUS) ×2 IMPLANT
BNDG ELASTIC 4X5.8 VLCR STR LF (GAUZE/BANDAGES/DRESSINGS) ×2 IMPLANT
CANISTER SUCT 3000ML PPV (MISCELLANEOUS) ×2 IMPLANT
CLIP VESOCCLUDE MED 24/CT (CLIP) IMPLANT
CLIP VESOCCLUDE MED 6/CT (CLIP) IMPLANT
CLIP VESOCCLUDE SM WIDE 24/CT (CLIP) IMPLANT
CLIP VESOCCLUDE SM WIDE 6/CT (CLIP) IMPLANT
COVER PROBE W GEL 5X96 (DRAPES) ×2 IMPLANT
COVER WAND RF STERILE (DRAPES) ×2 IMPLANT
DERMABOND ADVANCED (GAUZE/BANDAGES/DRESSINGS) ×3
DERMABOND ADVANCED .7 DNX12 (GAUZE/BANDAGES/DRESSINGS) ×1 IMPLANT
ELECT REM PT RETURN 9FT ADLT (ELECTROSURGICAL) ×2
ELECTRODE REM PT RTRN 9FT ADLT (ELECTROSURGICAL) ×1 IMPLANT
GLOVE BIOGEL PI IND STRL 7.5 (GLOVE) ×1 IMPLANT
GLOVE BIOGEL PI INDICATOR 7.5 (GLOVE) ×1
GLOVE SURG SS PI 7.5 STRL IVOR (GLOVE) ×2 IMPLANT
GOWN STRL REUS W/ TWL LRG LVL3 (GOWN DISPOSABLE) ×2 IMPLANT
GOWN STRL REUS W/ TWL XL LVL3 (GOWN DISPOSABLE) ×1 IMPLANT
GOWN STRL REUS W/TWL LRG LVL3 (GOWN DISPOSABLE) ×2
GOWN STRL REUS W/TWL XL LVL3 (GOWN DISPOSABLE) ×1
HEMOSTAT SNOW SURGICEL 2X4 (HEMOSTASIS) ×1 IMPLANT
KIT BASIN OR (CUSTOM PROCEDURE TRAY) ×2 IMPLANT
KIT TURNOVER KIT B (KITS) ×2 IMPLANT
NS IRRIG 1000ML POUR BTL (IV SOLUTION) ×2 IMPLANT
PACK CV ACCESS (CUSTOM PROCEDURE TRAY) ×2 IMPLANT
PAD ARMBOARD 7.5X6 YLW CONV (MISCELLANEOUS) ×4 IMPLANT
SUT PROLENE 6 0 CC (SUTURE) ×3 IMPLANT
SUT SILK 2 0 (SUTURE) ×1
SUT SILK 2 0 SH (SUTURE) ×1 IMPLANT
SUT SILK 2-0 18XBRD TIE 12 (SUTURE) IMPLANT
SUT VIC AB 3-0 SH 27 (SUTURE) ×2
SUT VIC AB 3-0 SH 27X BRD (SUTURE) ×1 IMPLANT
SUT VICRYL 4-0 PS2 18IN ABS (SUTURE) ×3 IMPLANT
TOWEL GREEN STERILE (TOWEL DISPOSABLE) ×2 IMPLANT
UNDERPAD 30X30 (UNDERPADS AND DIAPERS) ×2 IMPLANT
WATER STERILE IRR 1000ML POUR (IV SOLUTION) ×2 IMPLANT

## 2020-02-10 NOTE — Anesthesia Postprocedure Evaluation (Signed)
Anesthesia Post Note  Patient: Christian Dalton  Procedure(s) Performed: BASCILIC VEIN TRANSPOSITION LEFT SECOND STAGE (Left Arm Upper)     Patient location during evaluation: PACU Anesthesia Type: General Level of consciousness: awake and alert Pain management: pain level controlled Vital Signs Assessment: post-procedure vital signs reviewed and stable Respiratory status: spontaneous breathing, nonlabored ventilation, respiratory function stable and patient connected to nasal cannula oxygen Cardiovascular status: blood pressure returned to baseline and stable Postop Assessment: no apparent nausea or vomiting Anesthetic complications: no    Last Vitals:  Vitals:   02/10/20 1509 02/10/20 1525  BP: (!) 188/76 (!) 178/87  Pulse: 76 76  Resp: 19 18  Temp:    SpO2: 99% 100%    Last Pain:  Vitals:   02/10/20 1525  TempSrc:   PainSc: 4                  Shantara Goosby COKER

## 2020-02-10 NOTE — Op Note (Signed)
    Patient name: Jovanny Stephanie MRN: [id number] DOB: 03-15-1941 Sex: male  02/10/2020 Pre-operative Diagnosis: End-stage renal disease Post-operative diagnosis:  Same Surgeon:  [name] Assistants: Aldona Bar Ryne Procedure:   Second stage left basilic vein fistula creation Anesthesia: General Blood Loss: Minimal Specimens: None  Findings: Excellent caliber basilic vein.  I ligated the vein near the arteriovenous anastomosis and created a new arteriotomy and redid the arteriovenous anastomosis.  This was done because the patient had a retained valve and a somewhat smaller caliber vein just beyond the previous anastomosis.  The brachial artery at the level of the new anastomosis was thin walled.  It was ectatic but not aneurysmal.  Indications: The patient is here today for second stage basilic vein creation on the left.  Preoperative duplex suggested a narrow proximal vein near the arterial venous anastomosis likely secondary to a retained valve.  Procedure:  The patient was identified in the holding area and taken to [place] [facility]  The patient was then placed supine on the table. general anesthesia was administered.  The patient was prepped and draped in the usual sterile fashion.  A time out was called and antibiotics were administered.  Ultrasound was used to evaluate the basilic vein in the upper arm.  It was of excellent caliber throughout measuring approximately 1 cm.  2 longitudinal incisions were made on the medial aspect of the left upper arm anterior to the basilic vein.  Using cautery and sharp dissection, the vein was circumferentially exposed from the antecubital crease up to the axilla.  Side branches were ligated between silk ties.  The nerve was protected throughout.  Once the vein was fully exposed it was marked for orientation.  I then dissected out the brachial artery just proximal to the previous anastomosis.  A subcutaneous tunnel was then created between the 2 and incisions.   The vein was then brought through the tunnel, making sure to maintain proper orientation.  Next, the brachial artery was occluded with baby Gregory clamps and a #11 blade was used to make an arteriotomy which was extended longitudinally with Potts scissors.  The vein was spatulated to fit the size the arteriotomy and a running anastomosis was created with 6-0 Prolene.  Prior to completion the appropriate flushing maneuvers were performed and the anastomosis was completed.  The clamps were released.  There was an excellent thrill within the fistula.  I then ligated the previous basilic vein near the arteriovenous anastomosis with 2 silk 2 oh ties.  The arterial wall was somewhat thin.  The artery was not aneurysmal but ectatic.  The patient had a palpable radial pulse.  The wounds were then irrigated.  Hemostasis was achieved.  The incisions were closed with 2 layers of 3-0 Vicryl followed by Dermabond.  Curlex and Ace wrap were placed on the arm.   Disposition: To PACU stable.   Theotis Burrow, M.D., [facility] [place] Office: [phone] Pager:  [phone]

## 2020-02-10 NOTE — Interval H&P Note (Signed)
History and Physical Interval Note:  02/10/2020 11:49 AM  Christian Dalton  has presented today for surgery, with the diagnosis of END STAGE RENAL DISEASE.  The various methods of treatment have been discussed with the patient and family. After consideration of risks, benefits and other options for treatment, the patient has consented to  Procedure(s): [name] (Left) as a surgical intervention.  The patient's history has been reviewed, patient examined, no change in status, stable for surgery.  I have reviewed the patient's chart and labs.  Questions were answered to the patient's satisfaction.     [name]

## 2020-02-10 NOTE — Discharge Instructions (Signed)
   [facility]  Discharge Instructions  AV Fistula or Graft Surgery for Dialysis Access  Please refer to the following instructions for your post-procedure care. Your surgeon or physician assistant will discuss any changes with you.  Activity  You may drive the day following your surgery, if you are comfortable and no longer taking prescription pain medication. Resume full activity as the soreness in your incision resolves.  Bathing/Showering  You may shower after you go home. Keep your incision dry for 48 hours. Do not soak in a bathtub, hot tub, or swim until the incision heals completely. You may not shower if you have a hemodialysis catheter.  Incision Care  Clean your incision with mild soap and water after 48 hours. Pat the area dry with a clean towel. You do not need a bandage unless otherwise instructed. Do not apply any ointments or creams to your incision. You may have skin glue on your incision. Do not peel it off. It will come off on its own in about one week. Your arm may swell a bit after surgery. To reduce swelling use pillows to elevate your arm so it is above your heart. Your doctor will tell you if you need to lightly wrap your arm with an ACE bandage.  Diet  Resume your normal diet. There are not special food restrictions following this procedure. In order to heal from your surgery, it is CRITICAL to get adequate nutrition. Your body requires vitamins, minerals, and protein. Vegetables are the best source of vitamins and minerals. Vegetables also provide the perfect balance of protein. Processed food has little nutritional value, so try to avoid this.  Medications  Resume taking all of your medications. If your incision is causing pain, you may take over-the counter pain relievers such as acetaminophen (Tylenol). If you were prescribed a stronger pain medication, please be aware these medications can cause nausea and constipation. Prevent  nausea by taking the medication with a snack or meal. Avoid constipation by drinking plenty of fluids and eating foods with high amount of fiber, such as fruits, vegetables, and grains.  Do not take Tylenol if you are taking prescription pain medications.  Follow up Your surgeon may want to see you in the office following your access surgery. If so, this will be arranged at the time of your surgery.  Please call us immediately for any of the following conditions:  . Increased pain, redness, drainage (pus) from your incision site . Fever of 101 degrees or higher . Severe or worsening pain at your incision site . Hand pain or numbness. .  Reduce your risk of vascular disease:  . Stop smoking. If you would like help, call [facility] at 1-800-QUIT-NOW 360-734-5750) or [place] at (680)807-7302  . Manage your cholesterol . Maintain a desired weight . Control your diabetes . Keep your blood pressure down  Dialysis  It will take several weeks to several months for your new dialysis access to be ready for use. Your surgeon will determine when it is okay to use it. Your nephrologist will continue to direct your dialysis. You can continue to use your Permcath until your new access is ready for use.   02/10/2020 Roc Xu [id number] 26-Dec-1940  Surgeon(s): Serafina Mitchell, MD  Procedure(s): Left 2nd stage basilic vein transposition  x Do not stick fistula for 12 weeks    If you have any questions, please call the office at [phone].

## 2020-02-10 NOTE — Anesthesia Procedure Notes (Signed)
Procedure Name: LMA Insertion Date/Time: 02/10/2020 12:48 PM Performed by: Clearnce Sorrel, CRNA Pre-anesthesia Checklist: Patient identified, Emergency Drugs available, Suction available, Patient being monitored and Timeout performed Patient Re-evaluated:Patient Re-evaluated prior to induction Oxygen Delivery Method: Circle system utilized Preoxygenation: Pre-oxygenation with 100% oxygen Induction Type: IV induction LMA: LMA inserted LMA Size: 5.0 Placement Confirmation: positive ETCO2 and breath sounds checked- equal and bilateral Tube secured with: Tape Dental Injury: Teeth and Oropharynx as per pre-operative assessment

## 2020-02-11 DIAGNOSIS — Z992 Dependence on renal dialysis: Secondary | ICD-10-CM | POA: Diagnosis not present

## 2020-02-11 DIAGNOSIS — E1121 Type 2 diabetes mellitus with diabetic nephropathy: Secondary | ICD-10-CM | POA: Diagnosis not present

## 2020-02-11 DIAGNOSIS — N2581 Secondary hyperparathyroidism of renal origin: Secondary | ICD-10-CM | POA: Diagnosis not present

## 2020-02-11 DIAGNOSIS — N186 End stage renal disease: Secondary | ICD-10-CM | POA: Diagnosis not present

## 2020-02-12 NOTE — Anesthesia Postprocedure Evaluation (Signed)
Anesthesia Post Note  Patient: Christian Dalton  Procedure(s) Performed: BASCILIC VEIN TRANSPOSITION LEFT SECOND STAGE (Left Arm Upper)     Patient location during evaluation: PACU Anesthesia Type: General Level of consciousness: awake and alert Pain management: pain level controlled Vital Signs Assessment: post-procedure vital signs reviewed and stable Respiratory status: spontaneous breathing, nonlabored ventilation, respiratory function stable and patient connected to nasal cannula oxygen Cardiovascular status: blood pressure returned to baseline and stable Postop Assessment: no apparent nausea or vomiting Anesthetic complications: no    Last Vitals:  Vitals:   02/10/20 1525 02/10/20 1530  BP: (!) 178/87 (!) 160/82  Pulse: 76 77  Resp: 18 17  Temp:  36.7 C  SpO2: 100% 99%    Last Pain:  Vitals:   02/10/20 1530  TempSrc:   PainSc: 0-No pain                 Akeelah Seppala COKER

## 2020-02-14 ENCOUNTER — Encounter: Payer: Self-pay | Admitting: *Deleted

## 2020-02-14 DIAGNOSIS — N186 End stage renal disease: Secondary | ICD-10-CM | POA: Diagnosis not present

## 2020-02-14 DIAGNOSIS — E1121 Type 2 diabetes mellitus with diabetic nephropathy: Secondary | ICD-10-CM | POA: Diagnosis not present

## 2020-02-14 DIAGNOSIS — Z992 Dependence on renal dialysis: Secondary | ICD-10-CM | POA: Diagnosis not present

## 2020-02-14 DIAGNOSIS — N2581 Secondary hyperparathyroidism of renal origin: Secondary | ICD-10-CM | POA: Diagnosis not present

## 2020-02-16 DIAGNOSIS — Z992 Dependence on renal dialysis: Secondary | ICD-10-CM | POA: Diagnosis not present

## 2020-02-16 DIAGNOSIS — E1121 Type 2 diabetes mellitus with diabetic nephropathy: Secondary | ICD-10-CM | POA: Diagnosis not present

## 2020-02-16 DIAGNOSIS — N186 End stage renal disease: Secondary | ICD-10-CM | POA: Diagnosis not present

## 2020-02-16 DIAGNOSIS — N2581 Secondary hyperparathyroidism of renal origin: Secondary | ICD-10-CM | POA: Diagnosis not present

## 2020-02-17 ENCOUNTER — Ambulatory Visit (INDEPENDENT_AMBULATORY_CARE_PROVIDER_SITE_OTHER): Payer: Self-pay | Admitting: Vascular Surgery

## 2020-02-17 ENCOUNTER — Encounter: Payer: Self-pay | Admitting: Vascular Surgery

## 2020-02-17 ENCOUNTER — Other Ambulatory Visit: Payer: Self-pay

## 2020-02-17 VITALS — BP 144/78 | HR 78 | Temp 97.3°F | Resp 18 | Ht 69.5 in | Wt 213.0 lb

## 2020-02-17 DIAGNOSIS — Z992 Dependence on renal dialysis: Secondary | ICD-10-CM

## 2020-02-17 DIAGNOSIS — N186 End stage renal disease: Secondary | ICD-10-CM

## 2020-02-17 NOTE — Progress Notes (Signed)
    Subjective:     Patient ID: Christian Dalton, male   DOB: Jul 08, 1941, 79 y.o.   MRN: [id number]  HPI very pleasant gentleman shows up 1 week after left arm second stage basilic vein fistula creation.  Hand is doing well.  Has had some swelling and drainage of his left upper arm and was concerned and called to be seen prior to his arranged appointment.  No fevers.   Review of Systems Swelling and drainage left upper arm    Objective:   Physical Exam Vitals:   02/17/20 0816  BP: (!) 144/78  Pulse: 78  Resp: 18  Temp: (!) 97.3 F (36.3 C)  SpO2: 98%   Awake alert oriented Left upper arm with mild swelling no drainage noted incisions are clean dry intact Very strong thrill in the upper arm Palpable left radial pulse    Assessment/plan   79 year old male with end-stage renal disease on dialysis via catheter now status post two-stage basilic vein fistula creation.  He is doing well postop with mild drainage.  I told him to keep the incision dry but otherwise no intervention necessary at this time.  He will keep his regularly scheduled follow-up.    Teal Bontrager C. Donzetta Matters, MD [facility] [place] Office: [phone] Pager: [phone]

## 2020-02-18 DIAGNOSIS — E1121 Type 2 diabetes mellitus with diabetic nephropathy: Secondary | ICD-10-CM | POA: Diagnosis not present

## 2020-02-18 DIAGNOSIS — N2581 Secondary hyperparathyroidism of renal origin: Secondary | ICD-10-CM | POA: Diagnosis not present

## 2020-02-18 DIAGNOSIS — Z992 Dependence on renal dialysis: Secondary | ICD-10-CM | POA: Diagnosis not present

## 2020-02-18 DIAGNOSIS — N186 End stage renal disease: Secondary | ICD-10-CM | POA: Diagnosis not present

## 2020-02-21 DIAGNOSIS — E1142 Type 2 diabetes mellitus with diabetic polyneuropathy: Secondary | ICD-10-CM | POA: Diagnosis not present

## 2020-02-21 DIAGNOSIS — N2581 Secondary hyperparathyroidism of renal origin: Secondary | ICD-10-CM | POA: Diagnosis not present

## 2020-02-21 DIAGNOSIS — N186 End stage renal disease: Secondary | ICD-10-CM | POA: Diagnosis not present

## 2020-02-21 DIAGNOSIS — E119 Type 2 diabetes mellitus without complications: Secondary | ICD-10-CM | POA: Diagnosis not present

## 2020-02-21 DIAGNOSIS — E1121 Type 2 diabetes mellitus with diabetic nephropathy: Secondary | ICD-10-CM | POA: Diagnosis not present

## 2020-02-21 DIAGNOSIS — Z992 Dependence on renal dialysis: Secondary | ICD-10-CM | POA: Diagnosis not present

## 2020-02-23 DIAGNOSIS — E1121 Type 2 diabetes mellitus with diabetic nephropathy: Secondary | ICD-10-CM | POA: Diagnosis not present

## 2020-02-23 DIAGNOSIS — N186 End stage renal disease: Secondary | ICD-10-CM | POA: Diagnosis not present

## 2020-02-23 DIAGNOSIS — N2581 Secondary hyperparathyroidism of renal origin: Secondary | ICD-10-CM | POA: Diagnosis not present

## 2020-02-23 DIAGNOSIS — Z992 Dependence on renal dialysis: Secondary | ICD-10-CM | POA: Diagnosis not present

## 2020-02-23 DIAGNOSIS — E1122 Type 2 diabetes mellitus with diabetic chronic kidney disease: Secondary | ICD-10-CM | POA: Diagnosis not present

## 2020-02-28 ENCOUNTER — Encounter: Payer: Self-pay | Admitting: Certified Registered Nurse Anesthetist

## 2020-02-28 DIAGNOSIS — E1121 Type 2 diabetes mellitus with diabetic nephropathy: Secondary | ICD-10-CM | POA: Diagnosis not present

## 2020-02-28 DIAGNOSIS — N186 End stage renal disease: Secondary | ICD-10-CM | POA: Diagnosis not present

## 2020-02-28 DIAGNOSIS — N2581 Secondary hyperparathyroidism of renal origin: Secondary | ICD-10-CM | POA: Diagnosis not present

## 2020-02-28 DIAGNOSIS — Z992 Dependence on renal dialysis: Secondary | ICD-10-CM | POA: Diagnosis not present

## 2020-02-28 NOTE — Transfer of Care (Signed)
Immediate Anesthesia Transfer of Care Note  Patient: Christian Dalton  Procedure(s) Performed: BASCILIC VEIN TRANSPOSITION LEFT SECOND STAGE (Left Arm Upper)  Patient Location: PACU  Anesthesia Type:General  Level of Consciousness: awake, alert  and oriented  Airway & Oxygen Therapy: Patient Spontanous Breathing and Patient connected to nasal cannula oxygen  Post-op Assessment: Report given to RN and Post -op Vital signs reviewed and stable  Post vital signs: Reviewed and stable  Last Vitals:  Vitals Value Taken Time  BP 160/82 02/10/20 1530  Temp 36.7 C 02/10/20 1530  Pulse 77 02/10/20 1530  Resp 17 02/10/20 1530  SpO2 99 % 02/10/20 1530    Last Pain:  Vitals:   02/10/20 1530  TempSrc:   PainSc: 0-No pain      Patients Stated Pain Goal: 0 (99/14/44 5848)  Complications: No apparent anesthesia complications

## 2020-02-29 DIAGNOSIS — J410 Simple chronic bronchitis: Secondary | ICD-10-CM | POA: Diagnosis not present

## 2020-02-29 DIAGNOSIS — N186 End stage renal disease: Secondary | ICD-10-CM | POA: Diagnosis not present

## 2020-02-29 DIAGNOSIS — E1122 Type 2 diabetes mellitus with diabetic chronic kidney disease: Secondary | ICD-10-CM | POA: Diagnosis not present

## 2020-02-29 DIAGNOSIS — Z992 Dependence on renal dialysis: Secondary | ICD-10-CM | POA: Diagnosis not present

## 2020-02-29 DIAGNOSIS — E1151 Type 2 diabetes mellitus with diabetic peripheral angiopathy without gangrene: Secondary | ICD-10-CM | POA: Diagnosis not present

## 2020-02-29 DIAGNOSIS — Z794 Long term (current) use of insulin: Secondary | ICD-10-CM | POA: Diagnosis not present

## 2020-03-01 ENCOUNTER — Telehealth (HOSPITAL_COMMUNITY): Payer: Self-pay

## 2020-03-01 DIAGNOSIS — Z992 Dependence on renal dialysis: Secondary | ICD-10-CM | POA: Diagnosis not present

## 2020-03-01 DIAGNOSIS — N2581 Secondary hyperparathyroidism of renal origin: Secondary | ICD-10-CM | POA: Diagnosis not present

## 2020-03-01 DIAGNOSIS — E1121 Type 2 diabetes mellitus with diabetic nephropathy: Secondary | ICD-10-CM | POA: Diagnosis not present

## 2020-03-01 DIAGNOSIS — N186 End stage renal disease: Secondary | ICD-10-CM | POA: Diagnosis not present

## 2020-03-01 NOTE — Telephone Encounter (Signed)

## 2020-03-03 DIAGNOSIS — N2581 Secondary hyperparathyroidism of renal origin: Secondary | ICD-10-CM | POA: Diagnosis not present

## 2020-03-03 DIAGNOSIS — Z992 Dependence on renal dialysis: Secondary | ICD-10-CM | POA: Diagnosis not present

## 2020-03-03 DIAGNOSIS — N186 End stage renal disease: Secondary | ICD-10-CM | POA: Diagnosis not present

## 2020-03-03 DIAGNOSIS — E1121 Type 2 diabetes mellitus with diabetic nephropathy: Secondary | ICD-10-CM | POA: Diagnosis not present

## 2020-03-05 ENCOUNTER — Encounter: Payer: Self-pay | Admitting: Surgery

## 2020-03-05 ENCOUNTER — Other Ambulatory Visit: Payer: Self-pay

## 2020-03-05 ENCOUNTER — Ambulatory Visit (INDEPENDENT_AMBULATORY_CARE_PROVIDER_SITE_OTHER): Payer: Self-pay | Admitting: Surgery

## 2020-03-05 VITALS — BP 147/72 | HR 77 | Temp 97.7°F | Resp 20 | Ht 69.5 in | Wt 215.0 lb

## 2020-03-05 DIAGNOSIS — N186 End stage renal disease: Secondary | ICD-10-CM

## 2020-03-05 DIAGNOSIS — Z992 Dependence on renal dialysis: Secondary | ICD-10-CM

## 2020-03-05 NOTE — Progress Notes (Signed)
Patient name: Christian Dalton MRN: [id number] DOB: [birth year] Sex: male  REASON FOR VISIT:     post op  HISTORY OF PRESENT ILLNESS:   Christian Dalton is a 79 y.o. male who is status post second stage basilic vein fistula creation on 02/10/2020.  He was seen by Dr. Donzetta Matters 1 week later with some swelling and drainage in his left arm.  These have completely resolved.  He has no signs or symptoms of steal.  CURRENT MEDICATIONS:    Current Outpatient Medications  Medication Sig Dispense Refill  . aspirin EC 81 MG tablet Take 81 mg by mouth at bedtime.     Marland Kitchen atorvastatin (LIPITOR) 10 MG tablet Take 10 mg by mouth at bedtime.    Marland Kitchen buPROPion (WELLBUTRIN XL) 150 MG 24 hr tablet Take 150 mg by mouth every evening.     . docusate sodium (COLACE) 100 MG capsule Take 100 mg by mouth 3 (three) times daily.     Marland Kitchen escitalopram (LEXAPRO) 10 MG tablet Take 10 mg by mouth at bedtime.     . fluticasone (FLONASE) 50 MCG/ACT nasal spray Place 1 spray into both nostrils daily as needed for allergies or rhinitis.    Marland Kitchen insulin detemir (LEVEMIR) 100 UNIT/ML injection Inject 0.14 mLs (14 Units total) into the skin 2 (two) times daily. (Patient taking differently: Inject 5-10 Units into the skin 2 (two) times daily. ) 10 mL 11  . Insulin Lispro Prot & Lispro (HUMALOG MIX 75/25 KWIKPEN) (75-25) 100 UNIT/ML Kwikpen Take 5 Units by mouth every evening.     . lactulose, encephalopathy, (CHRONULAC) 10 GM/15ML SOLN Take 15 mLs by mouth every other day.   4  . levothyroxine (SYNTHROID, LEVOTHROID) 125 MCG tablet Take 125 mcg by mouth daily.     Marland Kitchen lidocaine-prilocaine (EMLA) cream Apply 1 application topically daily as needed (port access).   4  . linaclotide (LINZESS) 290 MCG CAPS capsule Take 290 mcg by mouth every other day.     . loratadine (CLARITIN) 10 MG tablet Take 10 mg by mouth daily.     . montelukast (SINGULAIR) 10 MG tablet Take 10 mg every evening by mouth.    . nitroGLYCERIN  (NITROSTAT) 0.4 MG SL tablet Place 0.4 mg under the tongue every 5 (five) minutes as needed for chest pain.    Marland Kitchen oxyCODONE-acetaminophen (PERCOCET) 10-325 MG tablet Take 1 tablet by mouth every 4 (four) hours as needed for pain.     Vladimir Faster Glycol-Propyl Glycol (SYSTANE OP) Place 1 drop into both eyes daily as needed (dry eyes).    . polyethylene glycol powder (GLYCOLAX/MIRALAX) 17 GM/SCOOP powder Take 17 g by mouth every other day.     . sevelamer carbonate (RENVELA) 800 MG tablet Take 3 tablets (2,400 mg total) by mouth 3 (three) times daily with meals. (Patient taking differently: Take 2,400 mg by mouth 2 (two) times daily with a meal. ) 45 tablet 0   No current facility-administered medications for this visit.    REVIEW OF SYSTEMS:   [X]  denotes positive finding, [ ]  denotes negative finding Cardiac  Comments:  Chest pain or chest pressure:    Shortness of breath upon exertion:    Short of breath when lying flat:    Irregular heart rhythm:    Constitutional    Fever or chills:      PHYSICAL EXAM:   Vitals:   03/05/20 1405  BP: (!) 147/72  Pulse: 77  Resp: 20  Temp: 97.7 F (  36.5 C)  SpO2: 97%  Weight: 215 lb (97.5 kg)  Height: 5' 9.5" (1.765 m)    GENERAL: The patient is a well-nourished male, in no acute distress. The vital signs are documented above. CARDIOVASCULAR: There is a regular rate and rhythm. PULMONARY: Non-labored respirations Excellent thrill within fistula.  Incisions have healed with no drainage or erythema.  He has a palpable left radial pulse  STUDIES:    None   MEDICAL ISSUES:   Status post second stage left basilic vein fistula creation on March 19.  His fistula will be ready for cannulation on April 19.  Leia Alf, MD, FACS [facility] [phone] Pager [phone]

## 2020-03-06 DIAGNOSIS — Z992 Dependence on renal dialysis: Secondary | ICD-10-CM | POA: Diagnosis not present

## 2020-03-06 DIAGNOSIS — N2581 Secondary hyperparathyroidism of renal origin: Secondary | ICD-10-CM | POA: Diagnosis not present

## 2020-03-06 DIAGNOSIS — E1121 Type 2 diabetes mellitus with diabetic nephropathy: Secondary | ICD-10-CM | POA: Diagnosis not present

## 2020-03-06 DIAGNOSIS — N186 End stage renal disease: Secondary | ICD-10-CM | POA: Diagnosis not present

## 2020-03-08 DIAGNOSIS — N186 End stage renal disease: Secondary | ICD-10-CM | POA: Diagnosis not present

## 2020-03-08 DIAGNOSIS — Z992 Dependence on renal dialysis: Secondary | ICD-10-CM | POA: Diagnosis not present

## 2020-03-08 DIAGNOSIS — N2581 Secondary hyperparathyroidism of renal origin: Secondary | ICD-10-CM | POA: Diagnosis not present

## 2020-03-08 DIAGNOSIS — E1121 Type 2 diabetes mellitus with diabetic nephropathy: Secondary | ICD-10-CM | POA: Diagnosis not present

## 2020-03-10 DIAGNOSIS — N2581 Secondary hyperparathyroidism of renal origin: Secondary | ICD-10-CM | POA: Diagnosis not present

## 2020-03-10 DIAGNOSIS — E1121 Type 2 diabetes mellitus with diabetic nephropathy: Secondary | ICD-10-CM | POA: Diagnosis not present

## 2020-03-10 DIAGNOSIS — N186 End stage renal disease: Secondary | ICD-10-CM | POA: Diagnosis not present

## 2020-03-10 DIAGNOSIS — Z992 Dependence on renal dialysis: Secondary | ICD-10-CM | POA: Diagnosis not present

## 2020-03-12 DIAGNOSIS — M47816 Spondylosis without myelopathy or radiculopathy, lumbar region: Secondary | ICD-10-CM | POA: Diagnosis not present

## 2020-03-13 DIAGNOSIS — E1121 Type 2 diabetes mellitus with diabetic nephropathy: Secondary | ICD-10-CM | POA: Diagnosis not present

## 2020-03-13 DIAGNOSIS — N186 End stage renal disease: Secondary | ICD-10-CM | POA: Diagnosis not present

## 2020-03-13 DIAGNOSIS — Z992 Dependence on renal dialysis: Secondary | ICD-10-CM | POA: Diagnosis not present

## 2020-03-13 DIAGNOSIS — N2581 Secondary hyperparathyroidism of renal origin: Secondary | ICD-10-CM | POA: Diagnosis not present

## 2020-03-15 DIAGNOSIS — E1121 Type 2 diabetes mellitus with diabetic nephropathy: Secondary | ICD-10-CM | POA: Diagnosis not present

## 2020-03-15 DIAGNOSIS — N2581 Secondary hyperparathyroidism of renal origin: Secondary | ICD-10-CM | POA: Diagnosis not present

## 2020-03-15 DIAGNOSIS — Z992 Dependence on renal dialysis: Secondary | ICD-10-CM | POA: Diagnosis not present

## 2020-03-15 DIAGNOSIS — N186 End stage renal disease: Secondary | ICD-10-CM | POA: Diagnosis not present

## 2020-03-17 DIAGNOSIS — N186 End stage renal disease: Secondary | ICD-10-CM | POA: Diagnosis not present

## 2020-03-17 DIAGNOSIS — Z992 Dependence on renal dialysis: Secondary | ICD-10-CM | POA: Diagnosis not present

## 2020-03-17 DIAGNOSIS — N2581 Secondary hyperparathyroidism of renal origin: Secondary | ICD-10-CM | POA: Diagnosis not present

## 2020-03-17 DIAGNOSIS — E1121 Type 2 diabetes mellitus with diabetic nephropathy: Secondary | ICD-10-CM | POA: Diagnosis not present

## 2020-03-20 DIAGNOSIS — E1121 Type 2 diabetes mellitus with diabetic nephropathy: Secondary | ICD-10-CM | POA: Diagnosis not present

## 2020-03-20 DIAGNOSIS — Z992 Dependence on renal dialysis: Secondary | ICD-10-CM | POA: Diagnosis not present

## 2020-03-20 DIAGNOSIS — N186 End stage renal disease: Secondary | ICD-10-CM | POA: Diagnosis not present

## 2020-03-20 DIAGNOSIS — N2581 Secondary hyperparathyroidism of renal origin: Secondary | ICD-10-CM | POA: Diagnosis not present

## 2020-03-22 DIAGNOSIS — E1121 Type 2 diabetes mellitus with diabetic nephropathy: Secondary | ICD-10-CM | POA: Diagnosis not present

## 2020-03-22 DIAGNOSIS — N186 End stage renal disease: Secondary | ICD-10-CM | POA: Diagnosis not present

## 2020-03-22 DIAGNOSIS — N2581 Secondary hyperparathyroidism of renal origin: Secondary | ICD-10-CM | POA: Diagnosis not present

## 2020-03-22 DIAGNOSIS — Z992 Dependence on renal dialysis: Secondary | ICD-10-CM | POA: Diagnosis not present

## 2020-03-24 DIAGNOSIS — E1122 Type 2 diabetes mellitus with diabetic chronic kidney disease: Secondary | ICD-10-CM | POA: Diagnosis not present

## 2020-03-24 DIAGNOSIS — N186 End stage renal disease: Secondary | ICD-10-CM | POA: Diagnosis not present

## 2020-03-24 DIAGNOSIS — N2581 Secondary hyperparathyroidism of renal origin: Secondary | ICD-10-CM | POA: Diagnosis not present

## 2020-03-24 DIAGNOSIS — Z992 Dependence on renal dialysis: Secondary | ICD-10-CM | POA: Diagnosis not present

## 2020-03-24 DIAGNOSIS — E1121 Type 2 diabetes mellitus with diabetic nephropathy: Secondary | ICD-10-CM | POA: Diagnosis not present

## 2020-03-26 DIAGNOSIS — M47816 Spondylosis without myelopathy or radiculopathy, lumbar region: Secondary | ICD-10-CM | POA: Diagnosis not present

## 2020-03-27 DIAGNOSIS — E1121 Type 2 diabetes mellitus with diabetic nephropathy: Secondary | ICD-10-CM | POA: Diagnosis not present

## 2020-03-27 DIAGNOSIS — Z992 Dependence on renal dialysis: Secondary | ICD-10-CM | POA: Diagnosis not present

## 2020-03-27 DIAGNOSIS — N2581 Secondary hyperparathyroidism of renal origin: Secondary | ICD-10-CM | POA: Diagnosis not present

## 2020-03-27 DIAGNOSIS — N186 End stage renal disease: Secondary | ICD-10-CM | POA: Diagnosis not present

## 2020-03-29 DIAGNOSIS — N2581 Secondary hyperparathyroidism of renal origin: Secondary | ICD-10-CM | POA: Diagnosis not present

## 2020-03-29 DIAGNOSIS — Z992 Dependence on renal dialysis: Secondary | ICD-10-CM | POA: Diagnosis not present

## 2020-03-29 DIAGNOSIS — N186 End stage renal disease: Secondary | ICD-10-CM | POA: Diagnosis not present

## 2020-03-29 DIAGNOSIS — E1121 Type 2 diabetes mellitus with diabetic nephropathy: Secondary | ICD-10-CM | POA: Diagnosis not present

## 2020-03-30 DIAGNOSIS — Z992 Dependence on renal dialysis: Secondary | ICD-10-CM | POA: Diagnosis not present

## 2020-03-30 DIAGNOSIS — N2581 Secondary hyperparathyroidism of renal origin: Secondary | ICD-10-CM | POA: Diagnosis not present

## 2020-03-30 DIAGNOSIS — E1151 Type 2 diabetes mellitus with diabetic peripheral angiopathy without gangrene: Secondary | ICD-10-CM | POA: Diagnosis not present

## 2020-03-30 DIAGNOSIS — I1311 Hypertensive heart and chronic kidney disease without heart failure, with stage 5 chronic kidney disease, or end stage renal disease: Secondary | ICD-10-CM | POA: Diagnosis not present

## 2020-03-30 DIAGNOSIS — N186 End stage renal disease: Secondary | ICD-10-CM | POA: Diagnosis not present

## 2020-03-31 DIAGNOSIS — N186 End stage renal disease: Secondary | ICD-10-CM | POA: Diagnosis not present

## 2020-03-31 DIAGNOSIS — Z992 Dependence on renal dialysis: Secondary | ICD-10-CM | POA: Diagnosis not present

## 2020-03-31 DIAGNOSIS — N2581 Secondary hyperparathyroidism of renal origin: Secondary | ICD-10-CM | POA: Diagnosis not present

## 2020-03-31 DIAGNOSIS — E1121 Type 2 diabetes mellitus with diabetic nephropathy: Secondary | ICD-10-CM | POA: Diagnosis not present

## 2020-04-03 DIAGNOSIS — N2581 Secondary hyperparathyroidism of renal origin: Secondary | ICD-10-CM | POA: Diagnosis not present

## 2020-04-03 DIAGNOSIS — Z992 Dependence on renal dialysis: Secondary | ICD-10-CM | POA: Diagnosis not present

## 2020-04-03 DIAGNOSIS — E1121 Type 2 diabetes mellitus with diabetic nephropathy: Secondary | ICD-10-CM | POA: Diagnosis not present

## 2020-04-03 DIAGNOSIS — N186 End stage renal disease: Secondary | ICD-10-CM | POA: Diagnosis not present

## 2020-04-05 DIAGNOSIS — Z992 Dependence on renal dialysis: Secondary | ICD-10-CM | POA: Diagnosis not present

## 2020-04-05 DIAGNOSIS — N2581 Secondary hyperparathyroidism of renal origin: Secondary | ICD-10-CM | POA: Diagnosis not present

## 2020-04-05 DIAGNOSIS — N186 End stage renal disease: Secondary | ICD-10-CM | POA: Diagnosis not present

## 2020-04-05 DIAGNOSIS — E1121 Type 2 diabetes mellitus with diabetic nephropathy: Secondary | ICD-10-CM | POA: Diagnosis not present

## 2020-04-07 DIAGNOSIS — N186 End stage renal disease: Secondary | ICD-10-CM | POA: Diagnosis not present

## 2020-04-07 DIAGNOSIS — N2581 Secondary hyperparathyroidism of renal origin: Secondary | ICD-10-CM | POA: Diagnosis not present

## 2020-04-07 DIAGNOSIS — E1121 Type 2 diabetes mellitus with diabetic nephropathy: Secondary | ICD-10-CM | POA: Diagnosis not present

## 2020-04-07 DIAGNOSIS — Z992 Dependence on renal dialysis: Secondary | ICD-10-CM | POA: Diagnosis not present

## 2020-04-10 DIAGNOSIS — N186 End stage renal disease: Secondary | ICD-10-CM | POA: Diagnosis not present

## 2020-04-10 DIAGNOSIS — Z992 Dependence on renal dialysis: Secondary | ICD-10-CM | POA: Diagnosis not present

## 2020-04-10 DIAGNOSIS — E1121 Type 2 diabetes mellitus with diabetic nephropathy: Secondary | ICD-10-CM | POA: Diagnosis not present

## 2020-04-10 DIAGNOSIS — N2581 Secondary hyperparathyroidism of renal origin: Secondary | ICD-10-CM | POA: Diagnosis not present

## 2020-04-12 DIAGNOSIS — N2581 Secondary hyperparathyroidism of renal origin: Secondary | ICD-10-CM | POA: Diagnosis not present

## 2020-04-12 DIAGNOSIS — N186 End stage renal disease: Secondary | ICD-10-CM | POA: Diagnosis not present

## 2020-04-12 DIAGNOSIS — Z992 Dependence on renal dialysis: Secondary | ICD-10-CM | POA: Diagnosis not present

## 2020-04-12 DIAGNOSIS — E1121 Type 2 diabetes mellitus with diabetic nephropathy: Secondary | ICD-10-CM | POA: Diagnosis not present

## 2020-04-14 DIAGNOSIS — N2581 Secondary hyperparathyroidism of renal origin: Secondary | ICD-10-CM | POA: Diagnosis not present

## 2020-04-14 DIAGNOSIS — E1121 Type 2 diabetes mellitus with diabetic nephropathy: Secondary | ICD-10-CM | POA: Diagnosis not present

## 2020-04-14 DIAGNOSIS — Z992 Dependence on renal dialysis: Secondary | ICD-10-CM | POA: Diagnosis not present

## 2020-04-14 DIAGNOSIS — N186 End stage renal disease: Secondary | ICD-10-CM | POA: Diagnosis not present

## 2020-04-16 DIAGNOSIS — Z452 Encounter for adjustment and management of vascular access device: Secondary | ICD-10-CM | POA: Diagnosis not present

## 2020-04-17 DIAGNOSIS — N2581 Secondary hyperparathyroidism of renal origin: Secondary | ICD-10-CM | POA: Diagnosis not present

## 2020-04-17 DIAGNOSIS — E1121 Type 2 diabetes mellitus with diabetic nephropathy: Secondary | ICD-10-CM | POA: Diagnosis not present

## 2020-04-17 DIAGNOSIS — Z992 Dependence on renal dialysis: Secondary | ICD-10-CM | POA: Diagnosis not present

## 2020-04-17 DIAGNOSIS — N186 End stage renal disease: Secondary | ICD-10-CM | POA: Diagnosis not present

## 2020-04-19 DIAGNOSIS — E1121 Type 2 diabetes mellitus with diabetic nephropathy: Secondary | ICD-10-CM | POA: Diagnosis not present

## 2020-04-19 DIAGNOSIS — N2581 Secondary hyperparathyroidism of renal origin: Secondary | ICD-10-CM | POA: Diagnosis not present

## 2020-04-19 DIAGNOSIS — K862 Cyst of pancreas: Secondary | ICD-10-CM | POA: Diagnosis not present

## 2020-04-19 DIAGNOSIS — Z992 Dependence on renal dialysis: Secondary | ICD-10-CM | POA: Diagnosis not present

## 2020-04-19 DIAGNOSIS — N186 End stage renal disease: Secondary | ICD-10-CM | POA: Diagnosis not present

## 2020-04-21 DIAGNOSIS — Z992 Dependence on renal dialysis: Secondary | ICD-10-CM | POA: Diagnosis not present

## 2020-04-21 DIAGNOSIS — N2581 Secondary hyperparathyroidism of renal origin: Secondary | ICD-10-CM | POA: Diagnosis not present

## 2020-04-21 DIAGNOSIS — E1121 Type 2 diabetes mellitus with diabetic nephropathy: Secondary | ICD-10-CM | POA: Diagnosis not present

## 2020-04-21 DIAGNOSIS — N186 End stage renal disease: Secondary | ICD-10-CM | POA: Diagnosis not present

## 2020-05-24 DIAGNOSIS — Z992 Dependence on renal dialysis: Secondary | ICD-10-CM | POA: Diagnosis not present

## 2020-05-24 DIAGNOSIS — D631 Anemia in chronic kidney disease: Secondary | ICD-10-CM | POA: Diagnosis not present

## 2020-05-24 DIAGNOSIS — N2581 Secondary hyperparathyroidism of renal origin: Secondary | ICD-10-CM | POA: Diagnosis not present

## 2020-05-24 DIAGNOSIS — E1122 Type 2 diabetes mellitus with diabetic chronic kidney disease: Secondary | ICD-10-CM | POA: Diagnosis not present

## 2020-05-24 DIAGNOSIS — D509 Iron deficiency anemia, unspecified: Secondary | ICD-10-CM | POA: Diagnosis not present

## 2020-05-24 DIAGNOSIS — N186 End stage renal disease: Secondary | ICD-10-CM | POA: Diagnosis not present

## 2020-05-26 DIAGNOSIS — D509 Iron deficiency anemia, unspecified: Secondary | ICD-10-CM | POA: Diagnosis not present

## 2020-05-26 DIAGNOSIS — D631 Anemia in chronic kidney disease: Secondary | ICD-10-CM | POA: Diagnosis not present

## 2020-05-26 DIAGNOSIS — N186 End stage renal disease: Secondary | ICD-10-CM | POA: Diagnosis not present

## 2020-05-26 DIAGNOSIS — Z992 Dependence on renal dialysis: Secondary | ICD-10-CM | POA: Diagnosis not present

## 2020-05-26 DIAGNOSIS — N2581 Secondary hyperparathyroidism of renal origin: Secondary | ICD-10-CM | POA: Diagnosis not present

## 2020-05-29 DIAGNOSIS — Z992 Dependence on renal dialysis: Secondary | ICD-10-CM | POA: Diagnosis not present

## 2020-05-29 DIAGNOSIS — N2581 Secondary hyperparathyroidism of renal origin: Secondary | ICD-10-CM | POA: Diagnosis not present

## 2020-05-29 DIAGNOSIS — N186 End stage renal disease: Secondary | ICD-10-CM | POA: Diagnosis not present

## 2020-05-29 DIAGNOSIS — D631 Anemia in chronic kidney disease: Secondary | ICD-10-CM | POA: Diagnosis not present

## 2020-05-29 DIAGNOSIS — D509 Iron deficiency anemia, unspecified: Secondary | ICD-10-CM | POA: Diagnosis not present

## 2020-05-31 DIAGNOSIS — D631 Anemia in chronic kidney disease: Secondary | ICD-10-CM | POA: Diagnosis not present

## 2020-05-31 DIAGNOSIS — D509 Iron deficiency anemia, unspecified: Secondary | ICD-10-CM | POA: Diagnosis not present

## 2020-05-31 DIAGNOSIS — N2581 Secondary hyperparathyroidism of renal origin: Secondary | ICD-10-CM | POA: Diagnosis not present

## 2020-05-31 DIAGNOSIS — N186 End stage renal disease: Secondary | ICD-10-CM | POA: Diagnosis not present

## 2020-05-31 DIAGNOSIS — Z992 Dependence on renal dialysis: Secondary | ICD-10-CM | POA: Diagnosis not present

## 2020-06-02 DIAGNOSIS — D631 Anemia in chronic kidney disease: Secondary | ICD-10-CM | POA: Diagnosis not present

## 2020-06-02 DIAGNOSIS — N186 End stage renal disease: Secondary | ICD-10-CM | POA: Diagnosis not present

## 2020-06-02 DIAGNOSIS — Z992 Dependence on renal dialysis: Secondary | ICD-10-CM | POA: Diagnosis not present

## 2020-06-02 DIAGNOSIS — D509 Iron deficiency anemia, unspecified: Secondary | ICD-10-CM | POA: Diagnosis not present

## 2020-06-02 DIAGNOSIS — N2581 Secondary hyperparathyroidism of renal origin: Secondary | ICD-10-CM | POA: Diagnosis not present

## 2020-06-05 DIAGNOSIS — N2581 Secondary hyperparathyroidism of renal origin: Secondary | ICD-10-CM | POA: Diagnosis not present

## 2020-06-05 DIAGNOSIS — D509 Iron deficiency anemia, unspecified: Secondary | ICD-10-CM | POA: Diagnosis not present

## 2020-06-05 DIAGNOSIS — N186 End stage renal disease: Secondary | ICD-10-CM | POA: Diagnosis not present

## 2020-06-05 DIAGNOSIS — Z992 Dependence on renal dialysis: Secondary | ICD-10-CM | POA: Diagnosis not present

## 2020-06-05 DIAGNOSIS — D631 Anemia in chronic kidney disease: Secondary | ICD-10-CM | POA: Diagnosis not present

## 2020-06-07 DIAGNOSIS — N186 End stage renal disease: Secondary | ICD-10-CM | POA: Diagnosis not present

## 2020-06-07 DIAGNOSIS — E1121 Type 2 diabetes mellitus with diabetic nephropathy: Secondary | ICD-10-CM | POA: Diagnosis not present

## 2020-06-07 DIAGNOSIS — D631 Anemia in chronic kidney disease: Secondary | ICD-10-CM | POA: Diagnosis not present

## 2020-06-07 DIAGNOSIS — D509 Iron deficiency anemia, unspecified: Secondary | ICD-10-CM | POA: Diagnosis not present

## 2020-06-07 DIAGNOSIS — N2581 Secondary hyperparathyroidism of renal origin: Secondary | ICD-10-CM | POA: Diagnosis not present

## 2020-06-07 DIAGNOSIS — Z992 Dependence on renal dialysis: Secondary | ICD-10-CM | POA: Diagnosis not present

## 2020-06-09 DIAGNOSIS — D509 Iron deficiency anemia, unspecified: Secondary | ICD-10-CM | POA: Diagnosis not present

## 2020-06-09 DIAGNOSIS — Z992 Dependence on renal dialysis: Secondary | ICD-10-CM | POA: Diagnosis not present

## 2020-06-09 DIAGNOSIS — N186 End stage renal disease: Secondary | ICD-10-CM | POA: Diagnosis not present

## 2020-06-09 DIAGNOSIS — N2581 Secondary hyperparathyroidism of renal origin: Secondary | ICD-10-CM | POA: Diagnosis not present

## 2020-06-09 DIAGNOSIS — D631 Anemia in chronic kidney disease: Secondary | ICD-10-CM | POA: Diagnosis not present

## 2020-06-12 DIAGNOSIS — N186 End stage renal disease: Secondary | ICD-10-CM | POA: Diagnosis not present

## 2020-06-12 DIAGNOSIS — Z992 Dependence on renal dialysis: Secondary | ICD-10-CM | POA: Diagnosis not present

## 2020-06-12 DIAGNOSIS — D631 Anemia in chronic kidney disease: Secondary | ICD-10-CM | POA: Diagnosis not present

## 2020-06-12 DIAGNOSIS — D509 Iron deficiency anemia, unspecified: Secondary | ICD-10-CM | POA: Diagnosis not present

## 2020-06-12 DIAGNOSIS — N2581 Secondary hyperparathyroidism of renal origin: Secondary | ICD-10-CM | POA: Diagnosis not present

## 2020-06-13 DIAGNOSIS — H3582 Retinal ischemia: Secondary | ICD-10-CM | POA: Diagnosis not present

## 2020-06-13 DIAGNOSIS — H31011 Macula scars of posterior pole (postinflammatory) (post-traumatic), right eye: Secondary | ICD-10-CM | POA: Diagnosis not present

## 2020-06-13 DIAGNOSIS — E113513 Type 2 diabetes mellitus with proliferative diabetic retinopathy with macular edema, bilateral: Secondary | ICD-10-CM | POA: Diagnosis not present

## 2020-06-14 DIAGNOSIS — D509 Iron deficiency anemia, unspecified: Secondary | ICD-10-CM | POA: Diagnosis not present

## 2020-06-14 DIAGNOSIS — N2581 Secondary hyperparathyroidism of renal origin: Secondary | ICD-10-CM | POA: Diagnosis not present

## 2020-06-14 DIAGNOSIS — D631 Anemia in chronic kidney disease: Secondary | ICD-10-CM | POA: Diagnosis not present

## 2020-06-14 DIAGNOSIS — Z992 Dependence on renal dialysis: Secondary | ICD-10-CM | POA: Diagnosis not present

## 2020-06-14 DIAGNOSIS — N186 End stage renal disease: Secondary | ICD-10-CM | POA: Diagnosis not present

## 2020-06-16 DIAGNOSIS — D509 Iron deficiency anemia, unspecified: Secondary | ICD-10-CM | POA: Diagnosis not present

## 2020-06-16 DIAGNOSIS — N2581 Secondary hyperparathyroidism of renal origin: Secondary | ICD-10-CM | POA: Diagnosis not present

## 2020-06-16 DIAGNOSIS — N186 End stage renal disease: Secondary | ICD-10-CM | POA: Diagnosis not present

## 2020-06-16 DIAGNOSIS — D631 Anemia in chronic kidney disease: Secondary | ICD-10-CM | POA: Diagnosis not present

## 2020-06-16 DIAGNOSIS — Z992 Dependence on renal dialysis: Secondary | ICD-10-CM | POA: Diagnosis not present

## 2020-06-18 ENCOUNTER — Encounter (HOSPITAL_COMMUNITY): Payer: Self-pay | Admitting: Surgery

## 2020-06-18 NOTE — Addendum Note (Signed)
Addendum  created 06/18/20 1403 by Josephine Igo, CRNA   Intraprocedure Event edited, Intraprocedure Staff edited

## 2020-06-19 DIAGNOSIS — D509 Iron deficiency anemia, unspecified: Secondary | ICD-10-CM | POA: Diagnosis not present

## 2020-06-19 DIAGNOSIS — D631 Anemia in chronic kidney disease: Secondary | ICD-10-CM | POA: Diagnosis not present

## 2020-06-19 DIAGNOSIS — N186 End stage renal disease: Secondary | ICD-10-CM | POA: Diagnosis not present

## 2020-06-19 DIAGNOSIS — Z992 Dependence on renal dialysis: Secondary | ICD-10-CM | POA: Diagnosis not present

## 2020-06-19 DIAGNOSIS — N2581 Secondary hyperparathyroidism of renal origin: Secondary | ICD-10-CM | POA: Diagnosis not present

## 2020-06-21 DIAGNOSIS — N186 End stage renal disease: Secondary | ICD-10-CM | POA: Diagnosis not present

## 2020-06-21 DIAGNOSIS — D631 Anemia in chronic kidney disease: Secondary | ICD-10-CM | POA: Diagnosis not present

## 2020-06-21 DIAGNOSIS — Z992 Dependence on renal dialysis: Secondary | ICD-10-CM | POA: Diagnosis not present

## 2020-06-21 DIAGNOSIS — N2581 Secondary hyperparathyroidism of renal origin: Secondary | ICD-10-CM | POA: Diagnosis not present

## 2020-06-21 DIAGNOSIS — D509 Iron deficiency anemia, unspecified: Secondary | ICD-10-CM | POA: Diagnosis not present

## 2020-06-23 DIAGNOSIS — N2581 Secondary hyperparathyroidism of renal origin: Secondary | ICD-10-CM | POA: Diagnosis not present

## 2020-06-23 DIAGNOSIS — Z992 Dependence on renal dialysis: Secondary | ICD-10-CM | POA: Diagnosis not present

## 2020-06-23 DIAGNOSIS — N186 End stage renal disease: Secondary | ICD-10-CM | POA: Diagnosis not present

## 2020-06-23 DIAGNOSIS — D509 Iron deficiency anemia, unspecified: Secondary | ICD-10-CM | POA: Diagnosis not present

## 2020-06-23 DIAGNOSIS — D631 Anemia in chronic kidney disease: Secondary | ICD-10-CM | POA: Diagnosis not present

## 2020-06-24 DIAGNOSIS — N186 End stage renal disease: Secondary | ICD-10-CM | POA: Diagnosis not present

## 2020-06-24 DIAGNOSIS — Z992 Dependence on renal dialysis: Secondary | ICD-10-CM | POA: Diagnosis not present

## 2020-06-24 DIAGNOSIS — E1122 Type 2 diabetes mellitus with diabetic chronic kidney disease: Secondary | ICD-10-CM | POA: Diagnosis not present

## 2020-06-26 DIAGNOSIS — N2581 Secondary hyperparathyroidism of renal origin: Secondary | ICD-10-CM | POA: Diagnosis not present

## 2020-06-26 DIAGNOSIS — N186 End stage renal disease: Secondary | ICD-10-CM | POA: Diagnosis not present

## 2020-06-26 DIAGNOSIS — Z992 Dependence on renal dialysis: Secondary | ICD-10-CM | POA: Diagnosis not present

## 2020-06-26 DIAGNOSIS — D631 Anemia in chronic kidney disease: Secondary | ICD-10-CM | POA: Diagnosis not present

## 2020-06-27 DIAGNOSIS — E113513 Type 2 diabetes mellitus with proliferative diabetic retinopathy with macular edema, bilateral: Secondary | ICD-10-CM | POA: Diagnosis not present

## 2020-06-28 DIAGNOSIS — N2581 Secondary hyperparathyroidism of renal origin: Secondary | ICD-10-CM | POA: Diagnosis not present

## 2020-06-28 DIAGNOSIS — D631 Anemia in chronic kidney disease: Secondary | ICD-10-CM | POA: Diagnosis not present

## 2020-06-28 DIAGNOSIS — Z992 Dependence on renal dialysis: Secondary | ICD-10-CM | POA: Diagnosis not present

## 2020-06-28 DIAGNOSIS — N186 End stage renal disease: Secondary | ICD-10-CM | POA: Diagnosis not present

## 2020-06-30 DIAGNOSIS — D631 Anemia in chronic kidney disease: Secondary | ICD-10-CM | POA: Diagnosis not present

## 2020-06-30 DIAGNOSIS — N186 End stage renal disease: Secondary | ICD-10-CM | POA: Diagnosis not present

## 2020-06-30 DIAGNOSIS — Z992 Dependence on renal dialysis: Secondary | ICD-10-CM | POA: Diagnosis not present

## 2020-06-30 DIAGNOSIS — N2581 Secondary hyperparathyroidism of renal origin: Secondary | ICD-10-CM | POA: Diagnosis not present

## 2020-07-03 DIAGNOSIS — N186 End stage renal disease: Secondary | ICD-10-CM | POA: Diagnosis not present

## 2020-07-03 DIAGNOSIS — D631 Anemia in chronic kidney disease: Secondary | ICD-10-CM | POA: Diagnosis not present

## 2020-07-03 DIAGNOSIS — Z992 Dependence on renal dialysis: Secondary | ICD-10-CM | POA: Diagnosis not present

## 2020-07-03 DIAGNOSIS — N2581 Secondary hyperparathyroidism of renal origin: Secondary | ICD-10-CM | POA: Diagnosis not present

## 2020-07-04 DIAGNOSIS — I871 Compression of vein: Secondary | ICD-10-CM | POA: Diagnosis not present

## 2020-07-04 DIAGNOSIS — Z992 Dependence on renal dialysis: Secondary | ICD-10-CM | POA: Diagnosis not present

## 2020-07-04 DIAGNOSIS — N186 End stage renal disease: Secondary | ICD-10-CM | POA: Diagnosis not present

## 2020-07-05 DIAGNOSIS — Z992 Dependence on renal dialysis: Secondary | ICD-10-CM | POA: Diagnosis not present

## 2020-07-05 DIAGNOSIS — D631 Anemia in chronic kidney disease: Secondary | ICD-10-CM | POA: Diagnosis not present

## 2020-07-05 DIAGNOSIS — N186 End stage renal disease: Secondary | ICD-10-CM | POA: Diagnosis not present

## 2020-07-05 DIAGNOSIS — N2581 Secondary hyperparathyroidism of renal origin: Secondary | ICD-10-CM | POA: Diagnosis not present

## 2020-07-07 DIAGNOSIS — D631 Anemia in chronic kidney disease: Secondary | ICD-10-CM | POA: Diagnosis not present

## 2020-07-07 DIAGNOSIS — Z992 Dependence on renal dialysis: Secondary | ICD-10-CM | POA: Diagnosis not present

## 2020-07-07 DIAGNOSIS — N2581 Secondary hyperparathyroidism of renal origin: Secondary | ICD-10-CM | POA: Diagnosis not present

## 2020-07-07 DIAGNOSIS — N186 End stage renal disease: Secondary | ICD-10-CM | POA: Diagnosis not present

## 2020-07-09 DIAGNOSIS — M47816 Spondylosis without myelopathy or radiculopathy, lumbar region: Secondary | ICD-10-CM | POA: Diagnosis not present

## 2020-07-10 DIAGNOSIS — D631 Anemia in chronic kidney disease: Secondary | ICD-10-CM | POA: Diagnosis not present

## 2020-07-10 DIAGNOSIS — N186 End stage renal disease: Secondary | ICD-10-CM | POA: Diagnosis not present

## 2020-07-10 DIAGNOSIS — Z992 Dependence on renal dialysis: Secondary | ICD-10-CM | POA: Diagnosis not present

## 2020-07-10 DIAGNOSIS — N2581 Secondary hyperparathyroidism of renal origin: Secondary | ICD-10-CM | POA: Diagnosis not present

## 2020-07-12 DIAGNOSIS — N2581 Secondary hyperparathyroidism of renal origin: Secondary | ICD-10-CM | POA: Diagnosis not present

## 2020-07-12 DIAGNOSIS — N186 End stage renal disease: Secondary | ICD-10-CM | POA: Diagnosis not present

## 2020-07-12 DIAGNOSIS — D631 Anemia in chronic kidney disease: Secondary | ICD-10-CM | POA: Diagnosis not present

## 2020-07-12 DIAGNOSIS — Z992 Dependence on renal dialysis: Secondary | ICD-10-CM | POA: Diagnosis not present

## 2020-07-14 DIAGNOSIS — D631 Anemia in chronic kidney disease: Secondary | ICD-10-CM | POA: Diagnosis not present

## 2020-07-14 DIAGNOSIS — N2581 Secondary hyperparathyroidism of renal origin: Secondary | ICD-10-CM | POA: Diagnosis not present

## 2020-07-14 DIAGNOSIS — Z992 Dependence on renal dialysis: Secondary | ICD-10-CM | POA: Diagnosis not present

## 2020-07-14 DIAGNOSIS — N186 End stage renal disease: Secondary | ICD-10-CM | POA: Diagnosis not present

## 2020-07-17 DIAGNOSIS — N186 End stage renal disease: Secondary | ICD-10-CM | POA: Diagnosis not present

## 2020-07-17 DIAGNOSIS — Z992 Dependence on renal dialysis: Secondary | ICD-10-CM | POA: Diagnosis not present

## 2020-07-17 DIAGNOSIS — N2581 Secondary hyperparathyroidism of renal origin: Secondary | ICD-10-CM | POA: Diagnosis not present

## 2020-07-17 DIAGNOSIS — D631 Anemia in chronic kidney disease: Secondary | ICD-10-CM | POA: Diagnosis not present

## 2020-07-18 ENCOUNTER — Other Ambulatory Visit: Payer: Self-pay | Admitting: Gastroenterology

## 2020-07-18 DIAGNOSIS — Z9049 Acquired absence of other specified parts of digestive tract: Secondary | ICD-10-CM | POA: Diagnosis not present

## 2020-07-18 DIAGNOSIS — R1013 Epigastric pain: Secondary | ICD-10-CM | POA: Diagnosis not present

## 2020-07-18 DIAGNOSIS — R630 Anorexia: Secondary | ICD-10-CM | POA: Diagnosis not present

## 2020-07-18 DIAGNOSIS — K862 Cyst of pancreas: Secondary | ICD-10-CM | POA: Diagnosis not present

## 2020-07-18 DIAGNOSIS — R634 Abnormal weight loss: Secondary | ICD-10-CM | POA: Diagnosis not present

## 2020-07-18 DIAGNOSIS — Z86010 Personal history of colonic polyps: Secondary | ICD-10-CM | POA: Diagnosis not present

## 2020-07-19 DIAGNOSIS — Z992 Dependence on renal dialysis: Secondary | ICD-10-CM | POA: Diagnosis not present

## 2020-07-19 DIAGNOSIS — N2581 Secondary hyperparathyroidism of renal origin: Secondary | ICD-10-CM | POA: Diagnosis not present

## 2020-07-19 DIAGNOSIS — N186 End stage renal disease: Secondary | ICD-10-CM | POA: Diagnosis not present

## 2020-07-19 DIAGNOSIS — D631 Anemia in chronic kidney disease: Secondary | ICD-10-CM | POA: Diagnosis not present

## 2020-07-21 DIAGNOSIS — D631 Anemia in chronic kidney disease: Secondary | ICD-10-CM | POA: Diagnosis not present

## 2020-07-21 DIAGNOSIS — Z992 Dependence on renal dialysis: Secondary | ICD-10-CM | POA: Diagnosis not present

## 2020-07-21 DIAGNOSIS — N186 End stage renal disease: Secondary | ICD-10-CM | POA: Diagnosis not present

## 2020-07-21 DIAGNOSIS — N2581 Secondary hyperparathyroidism of renal origin: Secondary | ICD-10-CM | POA: Diagnosis not present

## 2020-07-24 DIAGNOSIS — N2581 Secondary hyperparathyroidism of renal origin: Secondary | ICD-10-CM | POA: Diagnosis not present

## 2020-07-24 DIAGNOSIS — N186 End stage renal disease: Secondary | ICD-10-CM | POA: Diagnosis not present

## 2020-07-24 DIAGNOSIS — D631 Anemia in chronic kidney disease: Secondary | ICD-10-CM | POA: Diagnosis not present

## 2020-07-24 DIAGNOSIS — Z992 Dependence on renal dialysis: Secondary | ICD-10-CM | POA: Diagnosis not present

## 2020-07-25 DIAGNOSIS — Z992 Dependence on renal dialysis: Secondary | ICD-10-CM | POA: Diagnosis not present

## 2020-07-25 DIAGNOSIS — N186 End stage renal disease: Secondary | ICD-10-CM | POA: Diagnosis not present

## 2020-07-25 DIAGNOSIS — E1122 Type 2 diabetes mellitus with diabetic chronic kidney disease: Secondary | ICD-10-CM | POA: Diagnosis not present

## 2020-07-26 DIAGNOSIS — D631 Anemia in chronic kidney disease: Secondary | ICD-10-CM | POA: Diagnosis not present

## 2020-07-26 DIAGNOSIS — N2581 Secondary hyperparathyroidism of renal origin: Secondary | ICD-10-CM | POA: Diagnosis not present

## 2020-07-26 DIAGNOSIS — Z23 Encounter for immunization: Secondary | ICD-10-CM | POA: Diagnosis not present

## 2020-07-26 DIAGNOSIS — Z992 Dependence on renal dialysis: Secondary | ICD-10-CM | POA: Diagnosis not present

## 2020-07-26 DIAGNOSIS — N186 End stage renal disease: Secondary | ICD-10-CM | POA: Diagnosis not present

## 2020-07-28 DIAGNOSIS — Z992 Dependence on renal dialysis: Secondary | ICD-10-CM | POA: Diagnosis not present

## 2020-07-28 DIAGNOSIS — N2581 Secondary hyperparathyroidism of renal origin: Secondary | ICD-10-CM | POA: Diagnosis not present

## 2020-07-28 DIAGNOSIS — Z23 Encounter for immunization: Secondary | ICD-10-CM | POA: Diagnosis not present

## 2020-07-28 DIAGNOSIS — D631 Anemia in chronic kidney disease: Secondary | ICD-10-CM | POA: Diagnosis not present

## 2020-07-28 DIAGNOSIS — N186 End stage renal disease: Secondary | ICD-10-CM | POA: Diagnosis not present

## 2020-07-31 ENCOUNTER — Other Ambulatory Visit (HOSPITAL_COMMUNITY)
Admission: RE | Admit: 2020-07-31 | Discharge: 2020-07-31 | Disposition: A | Discharge status: Home / Self Care | Payer: Medicare Other | Source: Ambulatory Visit | Attending: Gastroenterology | Admitting: Gastroenterology

## 2020-07-31 DIAGNOSIS — Z01812 Encounter for preprocedural laboratory examination: Secondary | ICD-10-CM | POA: Diagnosis not present

## 2020-07-31 DIAGNOSIS — N186 End stage renal disease: Secondary | ICD-10-CM | POA: Diagnosis not present

## 2020-07-31 DIAGNOSIS — Z20822 Contact with and (suspected) exposure to covid-19: Secondary | ICD-10-CM | POA: Diagnosis not present

## 2020-07-31 DIAGNOSIS — D631 Anemia in chronic kidney disease: Secondary | ICD-10-CM | POA: Diagnosis not present

## 2020-07-31 DIAGNOSIS — Z992 Dependence on renal dialysis: Secondary | ICD-10-CM | POA: Diagnosis not present

## 2020-07-31 DIAGNOSIS — N2581 Secondary hyperparathyroidism of renal origin: Secondary | ICD-10-CM | POA: Diagnosis not present

## 2020-07-31 DIAGNOSIS — Z23 Encounter for immunization: Secondary | ICD-10-CM | POA: Diagnosis not present

## 2020-08-01 LAB — SARS CORONAVIRUS 2 (TAT 6-24 HRS): SARS Coronavirus 2: NEGATIVE

## 2020-08-02 DIAGNOSIS — Z992 Dependence on renal dialysis: Secondary | ICD-10-CM | POA: Diagnosis not present

## 2020-08-02 DIAGNOSIS — Z23 Encounter for immunization: Secondary | ICD-10-CM | POA: Diagnosis not present

## 2020-08-02 DIAGNOSIS — D631 Anemia in chronic kidney disease: Secondary | ICD-10-CM | POA: Diagnosis not present

## 2020-08-02 DIAGNOSIS — N2581 Secondary hyperparathyroidism of renal origin: Secondary | ICD-10-CM | POA: Diagnosis not present

## 2020-08-02 DIAGNOSIS — N186 End stage renal disease: Secondary | ICD-10-CM | POA: Diagnosis not present

## 2020-08-02 IMAGING — XA IR DAILY SHUNT INTRO NEEDLE/INTRACATH INITIAL W/IMG*L*
1 series · 14 of 24 positions shown · IV contrast (IODINE)
Comparison: none

INDICATION: Malfunction

[Series 300: dsa body · 14 of 48 slices shown]
[im 1/48]
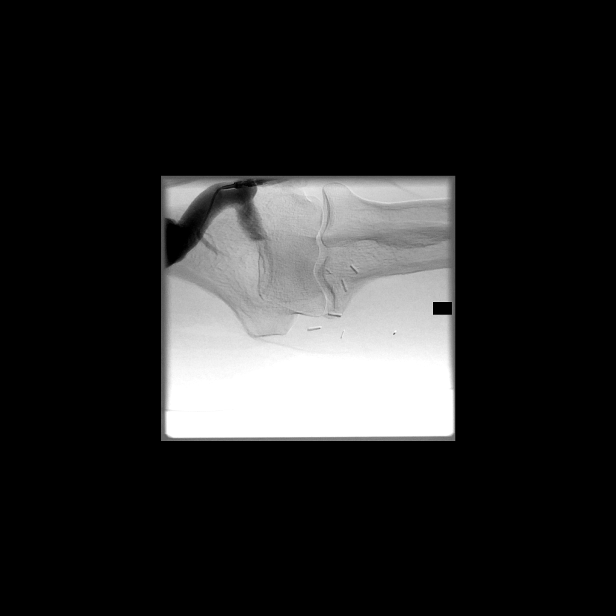
[im 5/48]
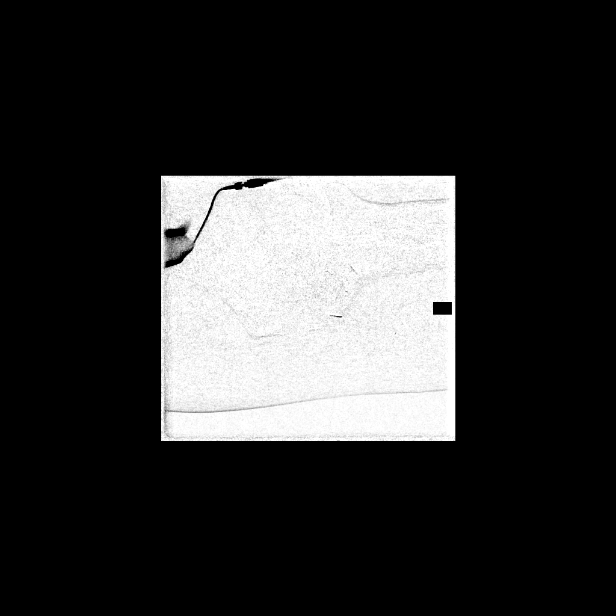
[im 9/48]
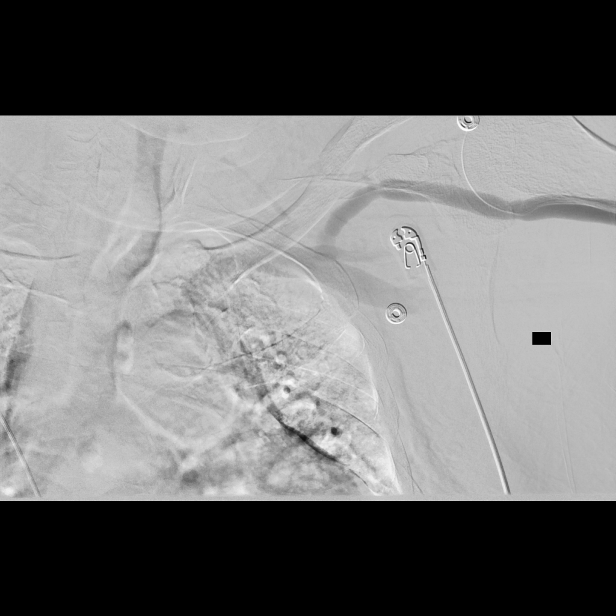
[im 13/48]
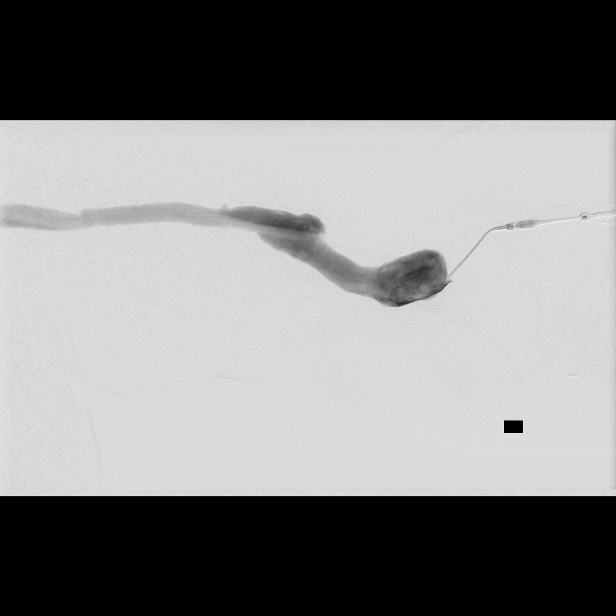
[im 15/48]
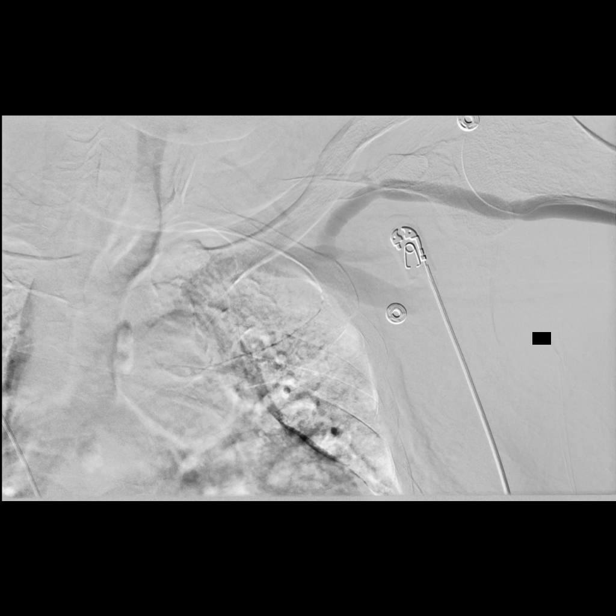
[im 19/48]
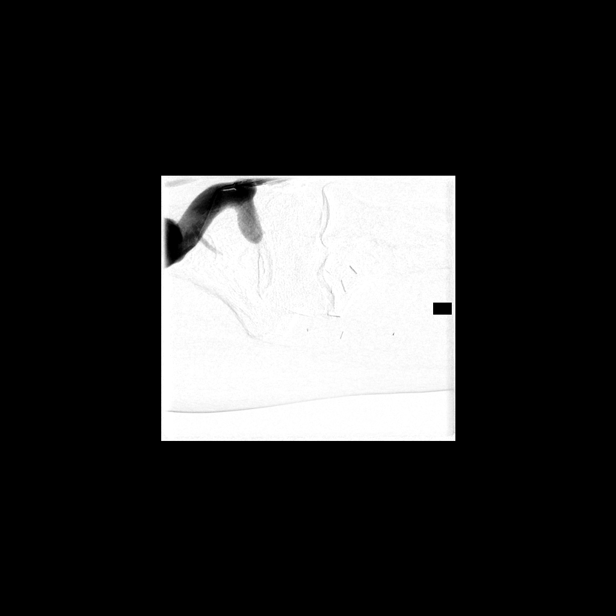
[im 23/48]
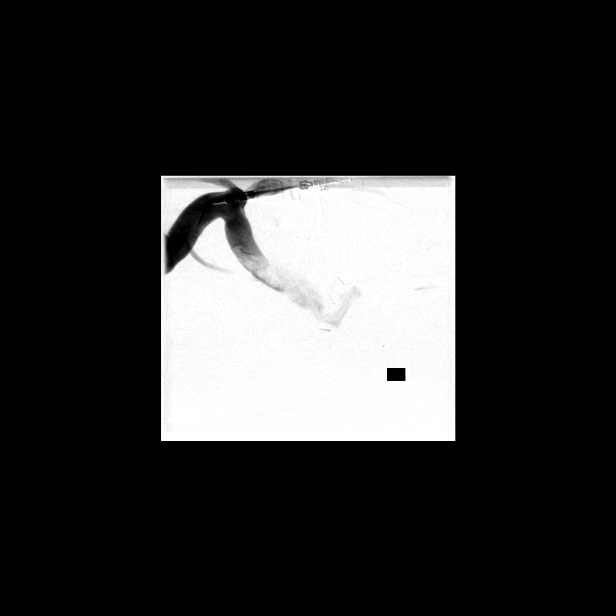
[im 25/48]
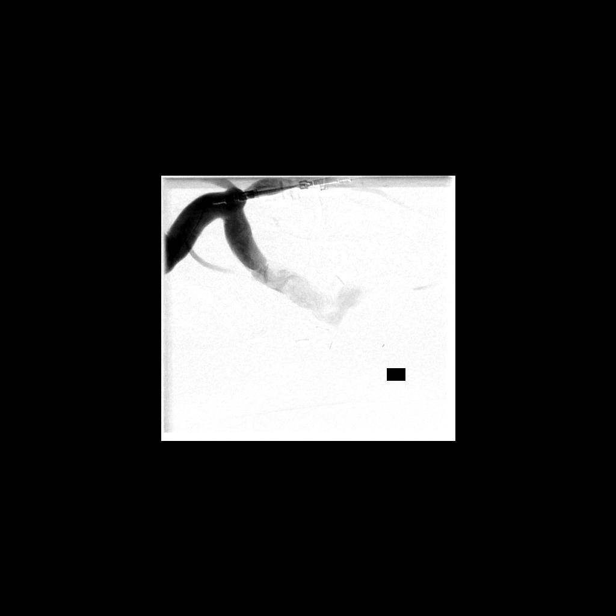
[im 29/48]
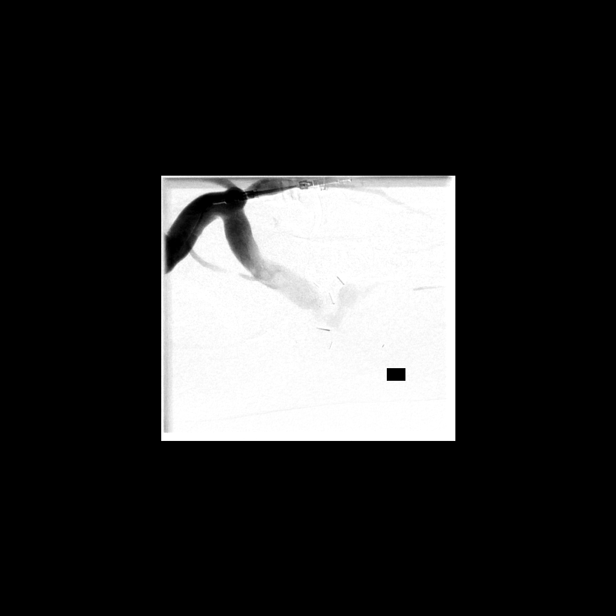
[im 33/48]
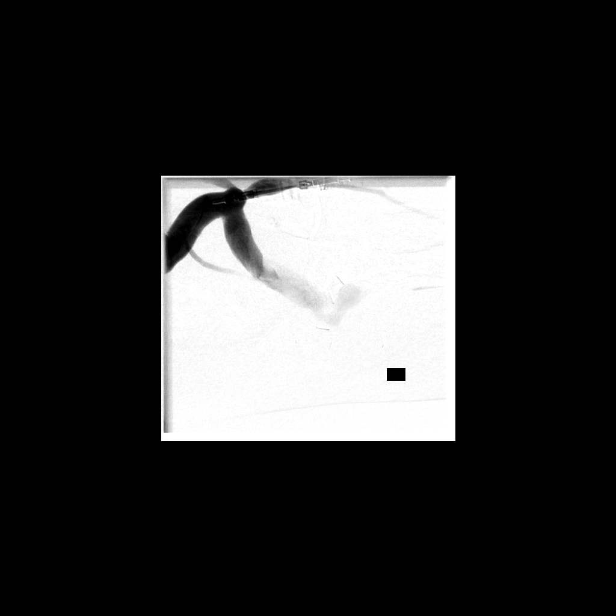
[im 37/48]
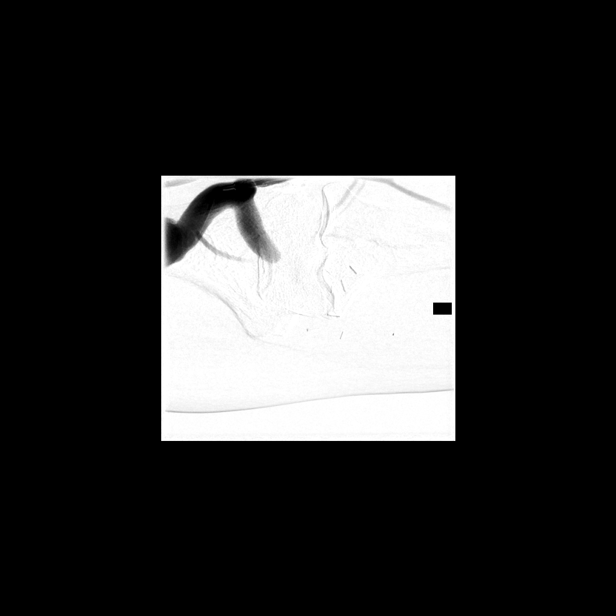
[im 39/48]
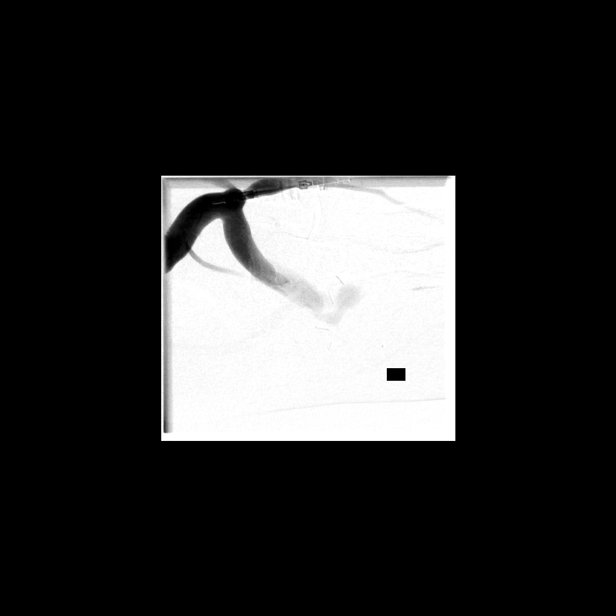
[im 43/48]
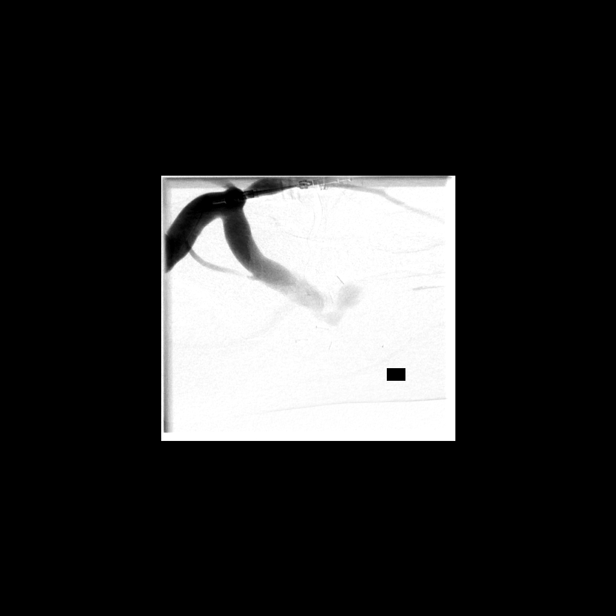
[im 48/48]
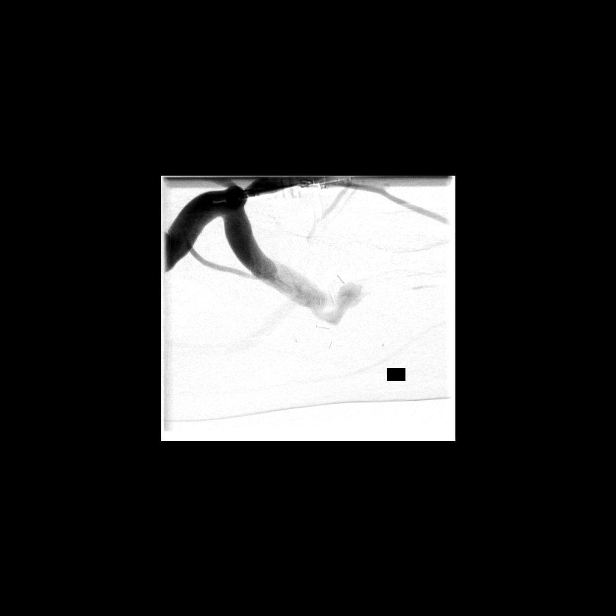

[14 of 24 positions shown; findings below may reference images not displayed]

EXAM:
AV FISTULAGRAM

MEDICATIONS:
None.

ANESTHESIA/SEDATION:
Moderate Sedation Time:

The patient was continuously monitored during the procedure by the
interventional radiology nurse under my direct supervision.

FLUOROSCOPY TIME:  Fluoroscopy Time:  minutes 12 seconds (28 mGy).

COMPLICATIONS:
None immediate.

PROCEDURE:
Informed written consent was obtained from the patient after a
thorough discussion of the procedural risks, benefits and
alternatives. All questions were addressed. Maximal Sterile Barrier
Technique was utilized including caps, mask, sterile gowns, sterile
gloves, sterile drape, hand hygiene and skin antiseptic. A timeout
was performed prior to the initiation of the procedure.

The left arm was prepped and draped in a sterile fashion. An 18
gauge Angiocath was inserted into the outflow vein. Contrast was
injected. The Angiocath was removed and hemostasis was achieved with
direct pressure.
FINDINGS: The arteriovenous anastomosis in the antecubital fossa, outflow
cephalic vein, and central venous structures are widely patent.
IMPRESSION: Left arm AV fistula circuit is patent.

ACCESS:
This access remains amenable to future percutaneous interventions as
clinically indicated.

## 2020-08-03 ENCOUNTER — Ambulatory Visit (HOSPITAL_COMMUNITY): Payer: Medicare Other | Admitting: Certified Registered Nurse Anesthetist

## 2020-08-03 ENCOUNTER — Other Ambulatory Visit: Payer: Self-pay

## 2020-08-03 ENCOUNTER — Encounter (HOSPITAL_COMMUNITY): Payer: Self-pay | Admitting: Gastroenterology

## 2020-08-03 ENCOUNTER — Ambulatory Visit (HOSPITAL_COMMUNITY)
Admission: RE | Admit: 2020-08-03 | Discharge: 2020-08-03 | Disposition: A | Discharge status: Home / Self Care | Payer: Medicare Other | Attending: Gastroenterology | Admitting: Gastroenterology

## 2020-08-03 ENCOUNTER — Encounter (HOSPITAL_COMMUNITY)
Admission: RE | Disposition: A | Discharge status: Home / Self Care | Payer: Self-pay | Source: Home / Self Care | Attending: Gastroenterology

## 2020-08-03 DIAGNOSIS — E1122 Type 2 diabetes mellitus with diabetic chronic kidney disease: Secondary | ICD-10-CM | POA: Insufficient documentation

## 2020-08-03 DIAGNOSIS — K5730 Diverticulosis of large intestine without perforation or abscess without bleeding: Secondary | ICD-10-CM | POA: Insufficient documentation

## 2020-08-03 DIAGNOSIS — N186 End stage renal disease: Secondary | ICD-10-CM | POA: Insufficient documentation

## 2020-08-03 DIAGNOSIS — R634 Abnormal weight loss: Secondary | ICD-10-CM | POA: Insufficient documentation

## 2020-08-03 DIAGNOSIS — I120 Hypertensive chronic kidney disease with stage 5 chronic kidney disease or end stage renal disease: Secondary | ICD-10-CM | POA: Insufficient documentation

## 2020-08-03 DIAGNOSIS — Z881 Allergy status to other antibiotic agents status: Secondary | ICD-10-CM | POA: Diagnosis not present

## 2020-08-03 DIAGNOSIS — D125 Benign neoplasm of sigmoid colon: Secondary | ICD-10-CM | POA: Diagnosis not present

## 2020-08-03 DIAGNOSIS — D124 Benign neoplasm of descending colon: Secondary | ICD-10-CM | POA: Insufficient documentation

## 2020-08-03 DIAGNOSIS — M1990 Unspecified osteoarthritis, unspecified site: Secondary | ICD-10-CM | POA: Diagnosis not present

## 2020-08-03 DIAGNOSIS — D123 Benign neoplasm of transverse colon: Secondary | ICD-10-CM | POA: Insufficient documentation

## 2020-08-03 DIAGNOSIS — Z91013 Allergy to seafood: Secondary | ICD-10-CM | POA: Insufficient documentation

## 2020-08-03 DIAGNOSIS — K2960 Other gastritis without bleeding: Secondary | ICD-10-CM | POA: Insufficient documentation

## 2020-08-03 DIAGNOSIS — K589 Irritable bowel syndrome without diarrhea: Secondary | ICD-10-CM | POA: Insufficient documentation

## 2020-08-03 DIAGNOSIS — E039 Hypothyroidism, unspecified: Secondary | ICD-10-CM | POA: Insufficient documentation

## 2020-08-03 DIAGNOSIS — R1013 Epigastric pain: Secondary | ICD-10-CM | POA: Diagnosis not present

## 2020-08-03 DIAGNOSIS — I2510 Atherosclerotic heart disease of native coronary artery without angina pectoris: Secondary | ICD-10-CM | POA: Insufficient documentation

## 2020-08-03 DIAGNOSIS — K2951 Unspecified chronic gastritis with bleeding: Secondary | ICD-10-CM | POA: Diagnosis not present

## 2020-08-03 DIAGNOSIS — K648 Other hemorrhoids: Secondary | ICD-10-CM | POA: Insufficient documentation

## 2020-08-03 DIAGNOSIS — Z1211 Encounter for screening for malignant neoplasm of colon: Secondary | ICD-10-CM | POA: Diagnosis not present

## 2020-08-03 DIAGNOSIS — D122 Benign neoplasm of ascending colon: Secondary | ICD-10-CM | POA: Insufficient documentation

## 2020-08-03 DIAGNOSIS — J449 Chronic obstructive pulmonary disease, unspecified: Secondary | ICD-10-CM | POA: Insufficient documentation

## 2020-08-03 DIAGNOSIS — Z992 Dependence on renal dialysis: Secondary | ICD-10-CM | POA: Diagnosis not present

## 2020-08-03 DIAGNOSIS — Z86010 Personal history of colonic polyps: Secondary | ICD-10-CM | POA: Diagnosis not present

## 2020-08-03 DIAGNOSIS — G4730 Sleep apnea, unspecified: Secondary | ICD-10-CM | POA: Insufficient documentation

## 2020-08-03 DIAGNOSIS — Z882 Allergy status to sulfonamides status: Secondary | ICD-10-CM | POA: Insufficient documentation

## 2020-08-03 DIAGNOSIS — D120 Benign neoplasm of cecum: Secondary | ICD-10-CM | POA: Diagnosis not present

## 2020-08-03 HISTORY — PX: BIOPSY: SHX5522

## 2020-08-03 HISTORY — PX: COLONOSCOPY WITH PROPOFOL: SHX5780

## 2020-08-03 HISTORY — PX: POLYPECTOMY: SHX5525

## 2020-08-03 HISTORY — PX: ESOPHAGOGASTRODUODENOSCOPY (EGD) WITH PROPOFOL: SHX5813

## 2020-08-03 LAB — POCT I-STAT, CHEM 8
BUN: 13 mg/dL (ref 8–23)
Calcium, Ion: 0.95 mmol/L — ABNORMAL LOW (ref 1.15–1.40)
Chloride: 105 mmol/L (ref 98–111)
Creatinine, Ser: 4 mg/dL — ABNORMAL HIGH (ref 0.61–1.24)
Glucose, Bld: 94 mg/dL (ref 70–99)
HCT: 32 % — ABNORMAL LOW (ref 39.0–52.0)
Hemoglobin: 10.9 g/dL — ABNORMAL LOW (ref 13.0–17.0)
Potassium: 2.5 mmol/L — CL (ref 3.5–5.1)
Sodium: 145 mmol/L (ref 135–145)
TCO2: 22 mmol/L (ref 22–32)

## 2020-08-03 LAB — GLUCOSE, CAPILLARY: Glucose-Capillary: 117 mg/dL — ABNORMAL HIGH (ref 70–99)

## 2020-08-03 SURGERY — COLONOSCOPY WITH PROPOFOL
Anesthesia: General

## 2020-08-03 MED ORDER — PANTOPRAZOLE SODIUM 40 MG PO TBEC: 40.0000 mg | DELAYED_RELEASE_TABLET | Freq: Two times a day (BID) | ORAL | 2 refills | Status: DC

## 2020-08-03 MED ORDER — SODIUM CHLORIDE 0.9 % IV SOLN: INTRAVENOUS | Status: DC | PRN

## 2020-08-03 MED ORDER — PHENYLEPHRINE 40 MCG/ML (10ML) SYRINGE FOR IV PUSH (FOR BLOOD PRESSURE SUPPORT)
PREFILLED_SYRINGE | INTRAVENOUS | Status: DC | PRN
  Administered 2020-08-03: 80 ug via INTRAVENOUS
  Administered 2020-08-03 (×2): 120 ug via INTRAVENOUS

## 2020-08-03 MED ORDER — PROPOFOL 10 MG/ML IV BOLUS
INTRAVENOUS | Status: DC | PRN
  Administered 2020-08-03 (×2): 20 mg via INTRAVENOUS

## 2020-08-03 MED ORDER — POTASSIUM CHLORIDE ER 10 MEQ PO TBCR
20.0000 meq | EXTENDED_RELEASE_TABLET | Freq: Every day | ORAL | 0 refills | Status: DC
Start: 2020-08-03 — End: 2020-12-04

## 2020-08-03 MED ORDER — PROPOFOL 500 MG/50ML IV EMUL
INTRAVENOUS | Status: DC | PRN
  Administered 2020-08-03: 75 ug/kg/min via INTRAVENOUS

## 2020-08-03 MED ORDER — LIDOCAINE 2% (20 MG/ML) 5 ML SYRINGE
INTRAMUSCULAR | Status: DC | PRN
  Administered 2020-08-03: 40 mg via INTRAVENOUS

## 2020-08-03 MED ORDER — PROPOFOL 500 MG/50ML IV EMUL
INTRAVENOUS | Status: AC
  Filled 2020-08-03: qty 50

## 2020-08-03 SURGICAL SUPPLY — 25 items

## 2020-08-03 NOTE — Transfer of Care (Signed)
Immediate Anesthesia Transfer of Care Note  Patient: Christian Dalton  Procedure(s) Performed: COLONOSCOPY WITH PROPOFOL (N/A ) ESOPHAGOGASTRODUODENOSCOPY (EGD) WITH PROPOFOL (N/A ) BIOPSY POLYPECTOMY  Patient Location: Endoscopy Unit  Anesthesia Type:MAC  Level of Consciousness: drowsy and patient cooperative  Airway & Oxygen Therapy: Patient Spontanous Breathing and Patient connected to face mask oxygen  Post-op Assessment: Report given to RN and Post -op Vital signs reviewed and stable  Post vital signs: Reviewed and stable  Last Vitals:  Vitals Value Taken Time  BP 89/46 08/03/20 1125  Temp    Pulse 84 08/03/20 1127  Resp 21 08/03/20 1127  SpO2 100 % 08/03/20 1127  Vitals shown include unvalidated device data.  Last Pain:  Vitals:   08/03/20 0930  TempSrc: Oral  PainSc: 0-No pain         Complications: No complications documented.

## 2020-08-03 NOTE — Discharge Instructions (Signed)

## 2020-08-03 NOTE — Anesthesia Preprocedure Evaluation (Addendum)
Anesthesia Evaluation  Patient identified by MRN, date of birth, ID band Patient awake    Reviewed: Allergy & Precautions, NPO status , Patient's Chart, lab work & pertinent test results  Airway Mallampati: III  TM Distance: >3 FB Neck ROM: Full    Dental  (+) Edentulous Upper Lower crowns, :   Pulmonary sleep apnea , COPD,    breath sounds clear to auscultation       Cardiovascular hypertension,  Rhythm:Regular Rate:Normal     Neuro/Psych PSYCHIATRIC DISORDERS Depression Hard of hearing    GI/Hepatic   Endo/Other  diabetes, Type 2Hypothyroidism   Renal/GU Dialysis and ESRFRenal disease (Dialysis: Tu/Th/Sa)     Musculoskeletal  (+) Arthritis , Osteoarthritis,    Abdominal (+) + obese,   Peds  Hematology   Anesthesia Other Findings   Reproductive/Obstetrics                            Anesthesia Physical  Anesthesia Plan  ASA: III  Anesthesia Plan: General   Post-op Pain Management:    Induction: Intravenous  PONV Risk Score and Plan: 2 and Propofol infusion and TIVA  Airway Management Planned: Simple Face Mask  Additional Equipment:   Intra-op Plan:   Post-operative Plan:   Informed Consent: I have reviewed the patients History and Physical, chart, labs and discussed the procedure including the risks, benefits and alternatives for the proposed anesthesia with the patient or authorized representative who has indicated his/her understanding and acceptance.       Plan Discussed with: CRNA and Anesthesiologist  Anesthesia Plan Comments: (K+ 2.5 s/p dialysis and prep.  Recently underwent first stage of transposition  12/02/19 without issue.   Follows with cardiology at [facility] for hx of LBBB, CAD (mild nonobstructive by cath 2016). He was last seen 01/01/20, stable from CV standpoint, advised 60yr followup.   IDDMII well controlled by last A1c 6.4 on 04/08/19.    EKG 04/08/19:  Sinus rhythm with 1st degree A-V block. Rate 77. Left anterior fasicular block. Moderate voltage criteria for LVH, may be normal variant. T wave abnormality, consider lateral ischemia. Prolonged QT (QTc 495).  EKG was done during admission 04/07/19 for nausea and vomiting -  Per discharge summary "T wave versions on EKG-x1, resolved on repeat EKGs. Patient without any cardiac symptoms, no chest pain, dyspnea on exertion, any other limiting factors in his activity levels or any anginal symptoms. His troponin has remained negative." Patient had repeat EKG on 04/22/19 by PCP showing sinus rhythm, LAD, LVH - narrative in care everywhere, tracing requested.   TTE 10/04/18 (care everywhere): Summary Mild left ventricular hypertrophy Normal left ventricular systolic function with no segmental wall motion abnormalities. Ejection fraction is visually estimated at 95-18% Diastolic function appears normal There is mild aortic sclerosis noted, with no evidence of stenosis.)       Anesthesia Quick Evaluation

## 2020-08-03 NOTE — H&P (Signed)
Christian Dalton is a 79 y.o. male was seen in the office for evaluation of epigastric abdominal pain, weight loss, decreased appetite, loose stools and personal history of colon polyps.  The various methods of treatment have been discussed with the patient and family. After consideration of risks, benefits and other options for treatment, the patient has consented to  Procedure(s): Esophagoduodenoscopy and colonoscopy as a surgical intervention .  The patient's history has been reviewed, patient examined, no change in status, stable for surgery.  I have reviewed the patient's chart and labs.  Questions were answered to the patient's satisfaction.   Risks (bleeding, infection, bowel perforation that could require surgery, sedation-related changes in cardiopulmonary systems), benefits (identification and possible treatment of source of symptoms, exclusion of certain causes of symptoms), and alternatives (watchful waiting, radiographic imaging studies, empiric medical treatment)  were explained to patient/family in detail and patient wishes to proceed.  Otis Brace MD, [place] 08/03/2020, 10:08 AM  Contact #  [phone]

## 2020-08-03 NOTE — Anesthesia Postprocedure Evaluation (Signed)
Anesthesia Post Note  Patient: Maciej Zacarias  Procedure(s) Performed: COLONOSCOPY WITH PROPOFOL (N/A ) ESOPHAGOGASTRODUODENOSCOPY (EGD) WITH PROPOFOL (N/A ) BIOPSY POLYPECTOMY     Patient location during evaluation: PACU Anesthesia Type: General Level of consciousness: awake and alert Pain management: pain level controlled Vital Signs Assessment: post-procedure vital signs reviewed and stable Respiratory status: spontaneous breathing and respiratory function stable Cardiovascular status: stable Postop Assessment: no apparent nausea or vomiting Anesthetic complications: no   No complications documented.  Last Vitals:  Vitals:   08/03/20 1140 08/03/20 1150  BP: (!) 114/51 132/72  Pulse: 87 86  Resp: 12 18  Temp:    SpO2: 99% 99%    Last Pain:  Vitals:   08/03/20 1140  TempSrc:   PainSc: 0-No pain                 Merlinda Frederick

## 2020-08-03 NOTE — Op Note (Signed)
[facility] Patient Name: Christian Dalton Procedure Date: 08/03/2020 MRN: [id number] Attending MD: Otis Brace , MD Date of Birth: 1941-11-10 CSN: [id number] Age: 79 Admit Type: Outpatient Procedure:                Colonoscopy Indications:              High risk colon cancer surveillance: Personal                            history of colonic polyps, Last colonoscopy: date                            unknown (unable to locate last colonoscopy report) Providers:                Otis Brace, MD, Doristine Johns, RN, Cletis Athens, Technician Referring MD:              Medicines:                Sedation Administered by an Anesthesia Professional Complications:            No immediate complications. Estimated Blood Loss:     Estimated blood loss was minimal. Procedure:                Pre-Anesthesia Assessment:                           - Prior to the procedure, a History and Physical                            was performed, and patient medications and                            allergies were reviewed. The patient's tolerance of                            previous anesthesia was also reviewed. The risks                            and benefits of the procedure and the sedation                            options and risks were discussed with the patient.                            All questions were answered, and informed consent                            was obtained. Prior Anticoagulants: The patient has                            taken no previous anticoagulant or antiplatelet  agents. ASA Grade Assessment: III - A patient with                            severe systemic disease. After reviewing the risks                            and benefits, the patient was deemed in                            satisfactory condition to undergo the procedure.                           - Prior to the procedure, a History and  Physical                            was performed, and patient medications and                            allergies were reviewed. The patient's tolerance of                            previous anesthesia was also reviewed. The risks                            and benefits of the procedure and the sedation                            options and risks were discussed with the patient.                            All questions were answered, and informed consent                            was obtained. Prior Anticoagulants: The patient has                            taken no previous anticoagulant or antiplatelet                            agents. ASA Grade Assessment: III - A patient with                            severe systemic disease. After reviewing the risks                            and benefits, the patient was deemed in                            satisfactory condition to undergo the procedure.                           After obtaining informed consent, the colonoscope  was passed under direct vision. Throughout the                            procedure, the patient's blood pressure, pulse, and                            oxygen saturations were monitored continuously. The                            PCF-H190DL ([id number] Olympus pediatric colonscope                            was introduced through the anus and advanced to the                            the terminal ileum, with identification of the                            appendiceal orifice and IC valve. The colonoscopy                            was performed without difficulty. The patient                            tolerated the procedure well. The quality of the                            bowel preparation was adequate to identify polyps 6                            mm and larger in size. Scope In: 10:48:00 AM Scope Out: 11:21:27 AM Scope Withdrawal Time: 0 hours 21 minutes 42 seconds  Total  Procedure Duration: 0 hours 33 minutes 27 seconds  Findings:      The perianal and digital rectal examinations were normal.      The terminal ileum appeared normal.      Three sessile polyps were found in the cecum. The polyps were 3 to 4 mm       in size. These polyps were removed with a cold snare. Resection and       retrieval were complete.      A 8 mm polyp was found in the ascending colon. The polyp was sessile.       The polyp was removed with a cold snare. Resection and retrieval were       complete.      Four sessile polyps were found in the transverse colon. The polyps were       3 to 7 mm in size. These polyps were removed with a cold snare.       Resection and retrieval were complete.      Four sessile polyps were found in the descending colon. The polyps were       3 to 6 mm in size. These polyps were removed with a cold snare.       Resection and retrieval were complete.      A 12 mm polyp was found in the sigmoid colon.  The polyp was sessile. The       polyp was removed with a piecemeal technique using a hot snare.       Resection and retrieval were complete.      Normal mucosa was found in the entire colon. Biopsies for histology were       taken with a cold forceps from the ascending colon and descending colon       for evaluation of microscopic colitis.      Multiple diverticula were found in the sigmoid colon.      Internal hemorrhoids were found during retroflexion. The hemorrhoids       were large.      There was significant spasm in the entire colon. Impression:               - The examined portion of the ileum was normal.                           - Three 3 to 4 mm polyps in the cecum, removed with                            a cold snare. Resected and retrieved.                           - One 8 mm polyp in the ascending colon, removed                            with a cold snare. Resected and retrieved.                           - Four 3 to 7 mm polyps in the  transverse colon,                            removed with a cold snare. Resected and retrieved.                           - Four 3 to 6 mm polyps in the descending colon,                            removed with a cold snare. Resected and retrieved.                           - One 12 mm polyp in the sigmoid colon, removed                            piecemeal using a hot snare. Resected and retrieved.                           - Normal mucosa in the entire examined colon.                            Biopsied.                           - Diverticulosis in the sigmoid  colon.                           - Internal hemorrhoids.                           - Significant colonic spasm. Moderate Sedation:      Moderate (conscious) sedation was personally administered by an       anesthesia professional. The following parameters were monitored: oxygen       saturation, heart rate, blood pressure, and response to care. Recommendation:           - Patient has a contact number available for                            emergencies. The signs and symptoms of potential                            delayed complications were discussed with the                            patient. Return to normal activities tomorrow.                            Written discharge instructions were provided to the                            patient.                           - Resume previous diet.                           - Continue present medications.                           - Await pathology results.                           - Repeat colonoscopy date to be determined after                            pending pathology results are reviewed for                            surveillance of multiple polyps.                           - Return to my office as previously scheduled. Procedure Code(s):        --- Professional ---                           [phone], Colonoscopy, flexible; with removal of                             tumor(s), polyp(s), or other lesion(s) by snare  technique                           45380, 59, Colonoscopy, flexible; with biopsy,                            single or multiple Diagnosis Code(s):        --- Professional ---                           K63.5, Polyp of colon                           Z86.010, Personal history of colonic polyps                           K64.8, Other hemorrhoids                           K58.9, Irritable bowel syndrome without diarrhea                           K57.30, Diverticulosis of large intestine without                            perforation or abscess without bleeding CPT copyright 2019 [employer]. All rights reserved. The codes documented in this report are preliminary and upon coder review may  be revised to meet current compliance requirements. Otis Brace, MD Otis Brace, MD 08/03/2020 11:36:32 AM Number of Addenda: 0

## 2020-08-03 NOTE — Op Note (Signed)
[facility] Patient Name: Christian Dalton Procedure Date: 08/03/2020 MRN: [id number] Attending MD: Otis Brace , MD Date of Birth: [birth year] CSN: [id number] Age: 79 Admit Type: Outpatient Procedure:                Upper GI endoscopy Indications:              Epigastric abdominal pain Providers:                Otis Brace, MD, Doristine Johns, RN, Cletis Athens, Technician Referring MD:              Medicines:                Sedation Administered by an Anesthesia Professional Complications:            No immediate complications. Estimated Blood Loss:     Estimated blood loss was minimal. Procedure:                Pre-Anesthesia Assessment:                           - Prior to the procedure, a History and Physical                            was performed, and patient medications and                            allergies were reviewed. The patient's tolerance of                            previous anesthesia was also reviewed. The risks                            and benefits of the procedure and the sedation                            options and risks were discussed with the patient.                            All questions were answered, and informed consent                            was obtained. Prior Anticoagulants: The patient has                            taken no previous anticoagulant or antiplatelet                            agents. ASA Grade Assessment: III - A patient with                            severe systemic disease. After reviewing the risks  and benefits, the patient was deemed in                            satisfactory condition to undergo the procedure.                           After obtaining informed consent, the endoscope was                            passed under direct vision. Throughout the                            procedure, the patient's blood pressure, pulse, and                             oxygen saturations were monitored continuously. The                            [id number]-H190 ([id number]) Olympus gastroscope was                            introduced through the mouth, and advanced to the                            second part of duodenum. The upper GI endoscopy was                            accomplished without difficulty. The patient                            tolerated the procedure well. Scope In: Scope Out: Findings:      The Z-line was regular and was found 40 cm from the incisors.      Scattered severe inflammation with hemorrhage characterized by adherent       blood, congestion (edema), erosions and erythema was found in the       gastric antrum. Biopsies were taken with a cold forceps for histology.      The cardia and gastric fundus were normal on retroflexion.      Diffuse moderate inflammation was found in the entire examined stomach.      The duodenal bulb, first portion of the duodenum and second portion of       the duodenum were normal. Biopsies for histology were taken with a cold       forceps for evaluation of celiac disease. Impression:               - Z-line regular, 40 cm from the incisors.                           - Chronic gastritis with hemorrhage. Biopsied.                           - Gastritis.                           - Normal duodenal bulb, first portion of  the                            duodenum and second portion of the duodenum.                            Biopsied. Moderate Sedation:      Moderate (conscious) sedation was personally administered by an       anesthesia professional. The following parameters were monitored: oxygen       saturation, heart rate, blood pressure, and response to care. Recommendation:           - Patient has a contact number available for                            emergencies. The signs and symptoms of potential                            delayed complications were discussed with the                             patient. Return to normal activities tomorrow.                            Written discharge instructions were provided to the                            patient.                           - Resume previous diet.                           - Continue present medications.                           - Await pathology results.                           - Use Protonix (pantoprazole) 40 mg PO BID for 2                            months. Procedure Code(s):        --- Professional ---                           [id number], Esophagogastroduodenoscopy, flexible,                            transoral; with biopsy, single or multiple Diagnosis Code(s):        --- Professional ---                           K29.51, Unspecified chronic gastritis with bleeding                           R10.13, Epigastric pain CPT copyright 2019 [employer] Association. All rights reserved. The  codes documented in this report are preliminary and upon coder review may  be revised to meet current compliance requirements. Otis Brace, MD Otis Brace, MD 08/03/2020 11:28:35 AM Number of Addenda: 0

## 2020-08-04 DIAGNOSIS — N2581 Secondary hyperparathyroidism of renal origin: Secondary | ICD-10-CM | POA: Diagnosis not present

## 2020-08-04 DIAGNOSIS — D631 Anemia in chronic kidney disease: Secondary | ICD-10-CM | POA: Diagnosis not present

## 2020-08-04 DIAGNOSIS — Z23 Encounter for immunization: Secondary | ICD-10-CM | POA: Diagnosis not present

## 2020-08-04 DIAGNOSIS — Z992 Dependence on renal dialysis: Secondary | ICD-10-CM | POA: Diagnosis not present

## 2020-08-04 DIAGNOSIS — N186 End stage renal disease: Secondary | ICD-10-CM | POA: Diagnosis not present

## 2020-08-06 ENCOUNTER — Other Ambulatory Visit: Payer: Self-pay

## 2020-08-06 DIAGNOSIS — M47816 Spondylosis without myelopathy or radiculopathy, lumbar region: Secondary | ICD-10-CM | POA: Diagnosis not present

## 2020-08-06 DIAGNOSIS — M25561 Pain in right knee: Secondary | ICD-10-CM | POA: Diagnosis not present

## 2020-08-06 LAB — SURGICAL PATHOLOGY

## 2020-08-07 ENCOUNTER — Encounter (HOSPITAL_COMMUNITY): Payer: Self-pay | Admitting: Gastroenterology

## 2020-08-07 DIAGNOSIS — N2581 Secondary hyperparathyroidism of renal origin: Secondary | ICD-10-CM | POA: Diagnosis not present

## 2020-08-07 DIAGNOSIS — D631 Anemia in chronic kidney disease: Secondary | ICD-10-CM | POA: Diagnosis not present

## 2020-08-07 DIAGNOSIS — N186 End stage renal disease: Secondary | ICD-10-CM | POA: Diagnosis not present

## 2020-08-07 DIAGNOSIS — Z23 Encounter for immunization: Secondary | ICD-10-CM | POA: Diagnosis not present

## 2020-08-07 DIAGNOSIS — Z992 Dependence on renal dialysis: Secondary | ICD-10-CM | POA: Diagnosis not present

## 2020-08-09 DIAGNOSIS — N186 End stage renal disease: Secondary | ICD-10-CM | POA: Diagnosis not present

## 2020-08-09 DIAGNOSIS — Z992 Dependence on renal dialysis: Secondary | ICD-10-CM | POA: Diagnosis not present

## 2020-08-09 DIAGNOSIS — Z23 Encounter for immunization: Secondary | ICD-10-CM | POA: Diagnosis not present

## 2020-08-09 DIAGNOSIS — N2581 Secondary hyperparathyroidism of renal origin: Secondary | ICD-10-CM | POA: Diagnosis not present

## 2020-08-09 DIAGNOSIS — D631 Anemia in chronic kidney disease: Secondary | ICD-10-CM | POA: Diagnosis not present

## 2020-08-11 DIAGNOSIS — D631 Anemia in chronic kidney disease: Secondary | ICD-10-CM | POA: Diagnosis not present

## 2020-08-11 DIAGNOSIS — N2581 Secondary hyperparathyroidism of renal origin: Secondary | ICD-10-CM | POA: Diagnosis not present

## 2020-08-11 DIAGNOSIS — N186 End stage renal disease: Secondary | ICD-10-CM | POA: Diagnosis not present

## 2020-08-11 DIAGNOSIS — Z23 Encounter for immunization: Secondary | ICD-10-CM | POA: Diagnosis not present

## 2020-08-11 DIAGNOSIS — Z992 Dependence on renal dialysis: Secondary | ICD-10-CM | POA: Diagnosis not present

## 2020-08-13 DIAGNOSIS — R799 Abnormal finding of blood chemistry, unspecified: Secondary | ICD-10-CM | POA: Diagnosis not present

## 2020-08-14 DIAGNOSIS — N186 End stage renal disease: Secondary | ICD-10-CM | POA: Diagnosis not present

## 2020-08-14 DIAGNOSIS — Z992 Dependence on renal dialysis: Secondary | ICD-10-CM | POA: Diagnosis not present

## 2020-08-14 DIAGNOSIS — D631 Anemia in chronic kidney disease: Secondary | ICD-10-CM | POA: Diagnosis not present

## 2020-08-14 DIAGNOSIS — N2581 Secondary hyperparathyroidism of renal origin: Secondary | ICD-10-CM | POA: Diagnosis not present

## 2020-08-14 DIAGNOSIS — Z23 Encounter for immunization: Secondary | ICD-10-CM | POA: Diagnosis not present

## 2020-08-16 DIAGNOSIS — N2581 Secondary hyperparathyroidism of renal origin: Secondary | ICD-10-CM | POA: Diagnosis not present

## 2020-08-16 DIAGNOSIS — D631 Anemia in chronic kidney disease: Secondary | ICD-10-CM | POA: Diagnosis not present

## 2020-08-16 DIAGNOSIS — N186 End stage renal disease: Secondary | ICD-10-CM | POA: Diagnosis not present

## 2020-08-16 DIAGNOSIS — Z23 Encounter for immunization: Secondary | ICD-10-CM | POA: Diagnosis not present

## 2020-08-16 DIAGNOSIS — Z992 Dependence on renal dialysis: Secondary | ICD-10-CM | POA: Diagnosis not present

## 2020-08-21 DIAGNOSIS — D631 Anemia in chronic kidney disease: Secondary | ICD-10-CM | POA: Diagnosis not present

## 2020-08-21 DIAGNOSIS — N2581 Secondary hyperparathyroidism of renal origin: Secondary | ICD-10-CM | POA: Diagnosis not present

## 2020-08-21 DIAGNOSIS — N186 End stage renal disease: Secondary | ICD-10-CM | POA: Diagnosis not present

## 2020-08-21 DIAGNOSIS — Z23 Encounter for immunization: Secondary | ICD-10-CM | POA: Diagnosis not present

## 2020-08-21 DIAGNOSIS — Z992 Dependence on renal dialysis: Secondary | ICD-10-CM | POA: Diagnosis not present

## 2020-08-23 DIAGNOSIS — Z23 Encounter for immunization: Secondary | ICD-10-CM | POA: Diagnosis not present

## 2020-08-23 DIAGNOSIS — N186 End stage renal disease: Secondary | ICD-10-CM | POA: Diagnosis not present

## 2020-08-23 DIAGNOSIS — Z992 Dependence on renal dialysis: Secondary | ICD-10-CM | POA: Diagnosis not present

## 2020-08-23 DIAGNOSIS — D631 Anemia in chronic kidney disease: Secondary | ICD-10-CM | POA: Diagnosis not present

## 2020-08-23 DIAGNOSIS — N2581 Secondary hyperparathyroidism of renal origin: Secondary | ICD-10-CM | POA: Diagnosis not present

## 2020-08-24 DIAGNOSIS — Z992 Dependence on renal dialysis: Secondary | ICD-10-CM | POA: Diagnosis not present

## 2020-08-24 DIAGNOSIS — N186 End stage renal disease: Secondary | ICD-10-CM | POA: Diagnosis not present

## 2020-08-24 DIAGNOSIS — E1122 Type 2 diabetes mellitus with diabetic chronic kidney disease: Secondary | ICD-10-CM | POA: Diagnosis not present

## 2020-08-25 DIAGNOSIS — N2581 Secondary hyperparathyroidism of renal origin: Secondary | ICD-10-CM | POA: Diagnosis not present

## 2020-08-25 DIAGNOSIS — Z992 Dependence on renal dialysis: Secondary | ICD-10-CM | POA: Diagnosis not present

## 2020-08-25 DIAGNOSIS — N186 End stage renal disease: Secondary | ICD-10-CM | POA: Diagnosis not present

## 2020-08-25 DIAGNOSIS — D631 Anemia in chronic kidney disease: Secondary | ICD-10-CM | POA: Diagnosis not present

## 2020-08-28 DIAGNOSIS — Z992 Dependence on renal dialysis: Secondary | ICD-10-CM | POA: Diagnosis not present

## 2020-08-28 DIAGNOSIS — N186 End stage renal disease: Secondary | ICD-10-CM | POA: Diagnosis not present

## 2020-08-28 DIAGNOSIS — D631 Anemia in chronic kidney disease: Secondary | ICD-10-CM | POA: Diagnosis not present

## 2020-08-28 DIAGNOSIS — N2581 Secondary hyperparathyroidism of renal origin: Secondary | ICD-10-CM | POA: Diagnosis not present

## 2020-08-30 DIAGNOSIS — Z992 Dependence on renal dialysis: Secondary | ICD-10-CM | POA: Diagnosis not present

## 2020-08-30 DIAGNOSIS — N186 End stage renal disease: Secondary | ICD-10-CM | POA: Diagnosis not present

## 2020-08-30 DIAGNOSIS — D631 Anemia in chronic kidney disease: Secondary | ICD-10-CM | POA: Diagnosis not present

## 2020-08-30 DIAGNOSIS — N2581 Secondary hyperparathyroidism of renal origin: Secondary | ICD-10-CM | POA: Diagnosis not present

## 2020-09-04 DIAGNOSIS — N186 End stage renal disease: Secondary | ICD-10-CM | POA: Diagnosis not present

## 2020-09-04 DIAGNOSIS — D631 Anemia in chronic kidney disease: Secondary | ICD-10-CM | POA: Diagnosis not present

## 2020-09-04 DIAGNOSIS — N2581 Secondary hyperparathyroidism of renal origin: Secondary | ICD-10-CM | POA: Diagnosis not present

## 2020-09-04 DIAGNOSIS — Z992 Dependence on renal dialysis: Secondary | ICD-10-CM | POA: Diagnosis not present

## 2020-09-06 DIAGNOSIS — E1121 Type 2 diabetes mellitus with diabetic nephropathy: Secondary | ICD-10-CM | POA: Diagnosis not present

## 2020-09-06 DIAGNOSIS — N186 End stage renal disease: Secondary | ICD-10-CM | POA: Diagnosis not present

## 2020-09-06 DIAGNOSIS — D631 Anemia in chronic kidney disease: Secondary | ICD-10-CM | POA: Diagnosis not present

## 2020-09-06 DIAGNOSIS — N2581 Secondary hyperparathyroidism of renal origin: Secondary | ICD-10-CM | POA: Diagnosis not present

## 2020-09-06 DIAGNOSIS — Z992 Dependence on renal dialysis: Secondary | ICD-10-CM | POA: Diagnosis not present

## 2020-09-08 DIAGNOSIS — N2581 Secondary hyperparathyroidism of renal origin: Secondary | ICD-10-CM | POA: Diagnosis not present

## 2020-09-08 DIAGNOSIS — Z992 Dependence on renal dialysis: Secondary | ICD-10-CM | POA: Diagnosis not present

## 2020-09-08 DIAGNOSIS — N186 End stage renal disease: Secondary | ICD-10-CM | POA: Diagnosis not present

## 2020-09-08 DIAGNOSIS — D631 Anemia in chronic kidney disease: Secondary | ICD-10-CM | POA: Diagnosis not present

## 2020-09-11 DIAGNOSIS — Z992 Dependence on renal dialysis: Secondary | ICD-10-CM | POA: Diagnosis not present

## 2020-09-11 DIAGNOSIS — N186 End stage renal disease: Secondary | ICD-10-CM | POA: Diagnosis not present

## 2020-09-11 DIAGNOSIS — N2581 Secondary hyperparathyroidism of renal origin: Secondary | ICD-10-CM | POA: Diagnosis not present

## 2020-09-11 DIAGNOSIS — D631 Anemia in chronic kidney disease: Secondary | ICD-10-CM | POA: Diagnosis not present

## 2020-09-13 DIAGNOSIS — Z992 Dependence on renal dialysis: Secondary | ICD-10-CM | POA: Diagnosis not present

## 2020-09-13 DIAGNOSIS — N2581 Secondary hyperparathyroidism of renal origin: Secondary | ICD-10-CM | POA: Diagnosis not present

## 2020-09-13 DIAGNOSIS — D631 Anemia in chronic kidney disease: Secondary | ICD-10-CM | POA: Diagnosis not present

## 2020-09-13 DIAGNOSIS — N186 End stage renal disease: Secondary | ICD-10-CM | POA: Diagnosis not present

## 2020-09-15 DIAGNOSIS — D631 Anemia in chronic kidney disease: Secondary | ICD-10-CM | POA: Diagnosis not present

## 2020-09-15 DIAGNOSIS — N2581 Secondary hyperparathyroidism of renal origin: Secondary | ICD-10-CM | POA: Diagnosis not present

## 2020-09-15 DIAGNOSIS — N186 End stage renal disease: Secondary | ICD-10-CM | POA: Diagnosis not present

## 2020-09-15 DIAGNOSIS — Z992 Dependence on renal dialysis: Secondary | ICD-10-CM | POA: Diagnosis not present

## 2020-09-18 DIAGNOSIS — Z992 Dependence on renal dialysis: Secondary | ICD-10-CM | POA: Diagnosis not present

## 2020-09-18 DIAGNOSIS — D631 Anemia in chronic kidney disease: Secondary | ICD-10-CM | POA: Diagnosis not present

## 2020-09-18 DIAGNOSIS — N186 End stage renal disease: Secondary | ICD-10-CM | POA: Diagnosis not present

## 2020-09-18 DIAGNOSIS — N2581 Secondary hyperparathyroidism of renal origin: Secondary | ICD-10-CM | POA: Diagnosis not present

## 2020-09-20 DIAGNOSIS — D631 Anemia in chronic kidney disease: Secondary | ICD-10-CM | POA: Diagnosis not present

## 2020-09-20 DIAGNOSIS — N186 End stage renal disease: Secondary | ICD-10-CM | POA: Diagnosis not present

## 2020-09-20 DIAGNOSIS — N2581 Secondary hyperparathyroidism of renal origin: Secondary | ICD-10-CM | POA: Diagnosis not present

## 2020-09-20 DIAGNOSIS — Z992 Dependence on renal dialysis: Secondary | ICD-10-CM | POA: Diagnosis not present

## 2020-09-22 DIAGNOSIS — N2581 Secondary hyperparathyroidism of renal origin: Secondary | ICD-10-CM | POA: Diagnosis not present

## 2020-09-22 DIAGNOSIS — D631 Anemia in chronic kidney disease: Secondary | ICD-10-CM | POA: Diagnosis not present

## 2020-09-22 DIAGNOSIS — N186 End stage renal disease: Secondary | ICD-10-CM | POA: Diagnosis not present

## 2020-09-22 DIAGNOSIS — Z992 Dependence on renal dialysis: Secondary | ICD-10-CM | POA: Diagnosis not present

## 2020-09-24 DIAGNOSIS — Z992 Dependence on renal dialysis: Secondary | ICD-10-CM | POA: Diagnosis not present

## 2020-09-24 DIAGNOSIS — N186 End stage renal disease: Secondary | ICD-10-CM | POA: Diagnosis not present

## 2020-09-24 DIAGNOSIS — E1122 Type 2 diabetes mellitus with diabetic chronic kidney disease: Secondary | ICD-10-CM | POA: Diagnosis not present

## 2020-09-25 DIAGNOSIS — N186 End stage renal disease: Secondary | ICD-10-CM | POA: Diagnosis not present

## 2020-09-25 DIAGNOSIS — Z992 Dependence on renal dialysis: Secondary | ICD-10-CM | POA: Diagnosis not present

## 2020-09-25 DIAGNOSIS — D631 Anemia in chronic kidney disease: Secondary | ICD-10-CM | POA: Diagnosis not present

## 2020-09-25 DIAGNOSIS — N2581 Secondary hyperparathyroidism of renal origin: Secondary | ICD-10-CM | POA: Diagnosis not present

## 2020-09-27 DIAGNOSIS — N2581 Secondary hyperparathyroidism of renal origin: Secondary | ICD-10-CM | POA: Diagnosis not present

## 2020-09-27 DIAGNOSIS — D631 Anemia in chronic kidney disease: Secondary | ICD-10-CM | POA: Diagnosis not present

## 2020-09-27 DIAGNOSIS — N186 End stage renal disease: Secondary | ICD-10-CM | POA: Diagnosis not present

## 2020-09-27 DIAGNOSIS — Z992 Dependence on renal dialysis: Secondary | ICD-10-CM | POA: Diagnosis not present

## 2020-09-29 DIAGNOSIS — D631 Anemia in chronic kidney disease: Secondary | ICD-10-CM | POA: Diagnosis not present

## 2020-09-29 DIAGNOSIS — N186 End stage renal disease: Secondary | ICD-10-CM | POA: Diagnosis not present

## 2020-09-29 DIAGNOSIS — N2581 Secondary hyperparathyroidism of renal origin: Secondary | ICD-10-CM | POA: Diagnosis not present

## 2020-09-29 DIAGNOSIS — Z992 Dependence on renal dialysis: Secondary | ICD-10-CM | POA: Diagnosis not present

## 2020-10-02 DIAGNOSIS — D631 Anemia in chronic kidney disease: Secondary | ICD-10-CM | POA: Diagnosis not present

## 2020-10-02 DIAGNOSIS — N2581 Secondary hyperparathyroidism of renal origin: Secondary | ICD-10-CM | POA: Diagnosis not present

## 2020-10-02 DIAGNOSIS — Z992 Dependence on renal dialysis: Secondary | ICD-10-CM | POA: Diagnosis not present

## 2020-10-02 DIAGNOSIS — N186 End stage renal disease: Secondary | ICD-10-CM | POA: Diagnosis not present

## 2020-10-04 DIAGNOSIS — Z992 Dependence on renal dialysis: Secondary | ICD-10-CM | POA: Diagnosis not present

## 2020-10-04 DIAGNOSIS — N186 End stage renal disease: Secondary | ICD-10-CM | POA: Diagnosis not present

## 2020-10-04 DIAGNOSIS — D631 Anemia in chronic kidney disease: Secondary | ICD-10-CM | POA: Diagnosis not present

## 2020-10-04 DIAGNOSIS — N2581 Secondary hyperparathyroidism of renal origin: Secondary | ICD-10-CM | POA: Diagnosis not present

## 2020-10-06 DIAGNOSIS — D631 Anemia in chronic kidney disease: Secondary | ICD-10-CM | POA: Diagnosis not present

## 2020-10-06 DIAGNOSIS — N2581 Secondary hyperparathyroidism of renal origin: Secondary | ICD-10-CM | POA: Diagnosis not present

## 2020-10-06 DIAGNOSIS — N186 End stage renal disease: Secondary | ICD-10-CM | POA: Diagnosis not present

## 2020-10-06 DIAGNOSIS — Z992 Dependence on renal dialysis: Secondary | ICD-10-CM | POA: Diagnosis not present

## 2020-10-09 DIAGNOSIS — N186 End stage renal disease: Secondary | ICD-10-CM | POA: Diagnosis not present

## 2020-10-09 DIAGNOSIS — D631 Anemia in chronic kidney disease: Secondary | ICD-10-CM | POA: Diagnosis not present

## 2020-10-09 DIAGNOSIS — N2581 Secondary hyperparathyroidism of renal origin: Secondary | ICD-10-CM | POA: Diagnosis not present

## 2020-10-09 DIAGNOSIS — Z992 Dependence on renal dialysis: Secondary | ICD-10-CM | POA: Diagnosis not present

## 2020-10-11 DIAGNOSIS — N2581 Secondary hyperparathyroidism of renal origin: Secondary | ICD-10-CM | POA: Diagnosis not present

## 2020-10-11 DIAGNOSIS — N186 End stage renal disease: Secondary | ICD-10-CM | POA: Diagnosis not present

## 2020-10-11 DIAGNOSIS — D631 Anemia in chronic kidney disease: Secondary | ICD-10-CM | POA: Diagnosis not present

## 2020-10-11 DIAGNOSIS — Z992 Dependence on renal dialysis: Secondary | ICD-10-CM | POA: Diagnosis not present

## 2020-10-13 DIAGNOSIS — D631 Anemia in chronic kidney disease: Secondary | ICD-10-CM | POA: Diagnosis not present

## 2020-10-13 DIAGNOSIS — N2581 Secondary hyperparathyroidism of renal origin: Secondary | ICD-10-CM | POA: Diagnosis not present

## 2020-10-13 DIAGNOSIS — N186 End stage renal disease: Secondary | ICD-10-CM | POA: Diagnosis not present

## 2020-10-13 DIAGNOSIS — Z992 Dependence on renal dialysis: Secondary | ICD-10-CM | POA: Diagnosis not present

## 2020-10-15 DIAGNOSIS — Z992 Dependence on renal dialysis: Secondary | ICD-10-CM | POA: Diagnosis not present

## 2020-10-15 DIAGNOSIS — N186 End stage renal disease: Secondary | ICD-10-CM | POA: Diagnosis not present

## 2020-10-15 DIAGNOSIS — N2581 Secondary hyperparathyroidism of renal origin: Secondary | ICD-10-CM | POA: Diagnosis not present

## 2020-10-15 DIAGNOSIS — D631 Anemia in chronic kidney disease: Secondary | ICD-10-CM | POA: Diagnosis not present

## 2020-10-16 DIAGNOSIS — E119 Type 2 diabetes mellitus without complications: Secondary | ICD-10-CM | POA: Diagnosis not present

## 2020-10-16 DIAGNOSIS — E1151 Type 2 diabetes mellitus with diabetic peripheral angiopathy without gangrene: Secondary | ICD-10-CM | POA: Diagnosis not present

## 2020-10-16 DIAGNOSIS — L03032 Cellulitis of left toe: Secondary | ICD-10-CM | POA: Diagnosis not present

## 2020-10-17 DIAGNOSIS — Z992 Dependence on renal dialysis: Secondary | ICD-10-CM | POA: Diagnosis not present

## 2020-10-17 DIAGNOSIS — D631 Anemia in chronic kidney disease: Secondary | ICD-10-CM | POA: Diagnosis not present

## 2020-10-17 DIAGNOSIS — N2581 Secondary hyperparathyroidism of renal origin: Secondary | ICD-10-CM | POA: Diagnosis not present

## 2020-10-17 DIAGNOSIS — N186 End stage renal disease: Secondary | ICD-10-CM | POA: Diagnosis not present

## 2020-10-20 DIAGNOSIS — Z992 Dependence on renal dialysis: Secondary | ICD-10-CM | POA: Diagnosis not present

## 2020-10-20 DIAGNOSIS — N2581 Secondary hyperparathyroidism of renal origin: Secondary | ICD-10-CM | POA: Diagnosis not present

## 2020-10-20 DIAGNOSIS — N186 End stage renal disease: Secondary | ICD-10-CM | POA: Diagnosis not present

## 2020-10-20 DIAGNOSIS — D631 Anemia in chronic kidney disease: Secondary | ICD-10-CM | POA: Diagnosis not present

## 2020-10-23 DIAGNOSIS — Z992 Dependence on renal dialysis: Secondary | ICD-10-CM | POA: Diagnosis not present

## 2020-10-23 DIAGNOSIS — N186 End stage renal disease: Secondary | ICD-10-CM | POA: Diagnosis not present

## 2020-10-23 DIAGNOSIS — N2581 Secondary hyperparathyroidism of renal origin: Secondary | ICD-10-CM | POA: Diagnosis not present

## 2020-10-23 DIAGNOSIS — D631 Anemia in chronic kidney disease: Secondary | ICD-10-CM | POA: Diagnosis not present

## 2020-10-24 DIAGNOSIS — E1122 Type 2 diabetes mellitus with diabetic chronic kidney disease: Secondary | ICD-10-CM | POA: Diagnosis not present

## 2020-10-24 DIAGNOSIS — Z992 Dependence on renal dialysis: Secondary | ICD-10-CM | POA: Diagnosis not present

## 2020-10-24 DIAGNOSIS — N186 End stage renal disease: Secondary | ICD-10-CM | POA: Diagnosis not present

## 2020-10-25 DIAGNOSIS — N2581 Secondary hyperparathyroidism of renal origin: Secondary | ICD-10-CM | POA: Diagnosis not present

## 2020-10-25 DIAGNOSIS — D631 Anemia in chronic kidney disease: Secondary | ICD-10-CM | POA: Diagnosis not present

## 2020-10-25 DIAGNOSIS — N186 End stage renal disease: Secondary | ICD-10-CM | POA: Diagnosis not present

## 2020-10-25 DIAGNOSIS — Z992 Dependence on renal dialysis: Secondary | ICD-10-CM | POA: Diagnosis not present

## 2020-10-26 DIAGNOSIS — E1151 Type 2 diabetes mellitus with diabetic peripheral angiopathy without gangrene: Secondary | ICD-10-CM | POA: Diagnosis not present

## 2020-10-26 DIAGNOSIS — Z794 Long term (current) use of insulin: Secondary | ICD-10-CM | POA: Diagnosis not present

## 2020-10-26 DIAGNOSIS — R269 Unspecified abnormalities of gait and mobility: Secondary | ICD-10-CM | POA: Diagnosis not present

## 2020-10-26 DIAGNOSIS — E1122 Type 2 diabetes mellitus with diabetic chronic kidney disease: Secondary | ICD-10-CM | POA: Diagnosis not present

## 2020-10-26 DIAGNOSIS — Z992 Dependence on renal dialysis: Secondary | ICD-10-CM | POA: Diagnosis not present

## 2020-10-26 DIAGNOSIS — I1311 Hypertensive heart and chronic kidney disease without heart failure, with stage 5 chronic kidney disease, or end stage renal disease: Secondary | ICD-10-CM | POA: Diagnosis not present

## 2020-10-26 DIAGNOSIS — E1142 Type 2 diabetes mellitus with diabetic polyneuropathy: Secondary | ICD-10-CM | POA: Diagnosis not present

## 2020-10-26 DIAGNOSIS — N186 End stage renal disease: Secondary | ICD-10-CM | POA: Diagnosis not present

## 2020-10-26 DIAGNOSIS — Z0000 Encounter for general adult medical examination without abnormal findings: Secondary | ICD-10-CM | POA: Diagnosis not present

## 2020-10-27 DIAGNOSIS — N186 End stage renal disease: Secondary | ICD-10-CM | POA: Diagnosis not present

## 2020-10-27 DIAGNOSIS — N2581 Secondary hyperparathyroidism of renal origin: Secondary | ICD-10-CM | POA: Diagnosis not present

## 2020-10-27 DIAGNOSIS — Z992 Dependence on renal dialysis: Secondary | ICD-10-CM | POA: Diagnosis not present

## 2020-10-27 DIAGNOSIS — D631 Anemia in chronic kidney disease: Secondary | ICD-10-CM | POA: Diagnosis not present

## 2020-10-30 DIAGNOSIS — L03032 Cellulitis of left toe: Secondary | ICD-10-CM | POA: Diagnosis not present

## 2020-10-30 DIAGNOSIS — N186 End stage renal disease: Secondary | ICD-10-CM | POA: Diagnosis not present

## 2020-10-30 DIAGNOSIS — D631 Anemia in chronic kidney disease: Secondary | ICD-10-CM | POA: Diagnosis not present

## 2020-10-30 DIAGNOSIS — N2581 Secondary hyperparathyroidism of renal origin: Secondary | ICD-10-CM | POA: Diagnosis not present

## 2020-10-30 DIAGNOSIS — E1151 Type 2 diabetes mellitus with diabetic peripheral angiopathy without gangrene: Secondary | ICD-10-CM | POA: Diagnosis not present

## 2020-10-30 DIAGNOSIS — Z992 Dependence on renal dialysis: Secondary | ICD-10-CM | POA: Diagnosis not present

## 2020-10-31 ENCOUNTER — Other Ambulatory Visit: Payer: Self-pay

## 2020-10-31 DIAGNOSIS — I739 Peripheral vascular disease, unspecified: Secondary | ICD-10-CM

## 2020-11-01 DIAGNOSIS — Z992 Dependence on renal dialysis: Secondary | ICD-10-CM | POA: Diagnosis not present

## 2020-11-01 DIAGNOSIS — N186 End stage renal disease: Secondary | ICD-10-CM | POA: Diagnosis not present

## 2020-11-01 DIAGNOSIS — N2581 Secondary hyperparathyroidism of renal origin: Secondary | ICD-10-CM | POA: Diagnosis not present

## 2020-11-01 DIAGNOSIS — D631 Anemia in chronic kidney disease: Secondary | ICD-10-CM | POA: Diagnosis not present

## 2020-11-02 DIAGNOSIS — M47816 Spondylosis without myelopathy or radiculopathy, lumbar region: Secondary | ICD-10-CM | POA: Diagnosis not present

## 2020-11-02 DIAGNOSIS — M25561 Pain in right knee: Secondary | ICD-10-CM | POA: Diagnosis not present

## 2020-11-03 DIAGNOSIS — N2581 Secondary hyperparathyroidism of renal origin: Secondary | ICD-10-CM | POA: Diagnosis not present

## 2020-11-03 DIAGNOSIS — N186 End stage renal disease: Secondary | ICD-10-CM | POA: Diagnosis not present

## 2020-11-03 DIAGNOSIS — D631 Anemia in chronic kidney disease: Secondary | ICD-10-CM | POA: Diagnosis not present

## 2020-11-03 DIAGNOSIS — Z992 Dependence on renal dialysis: Secondary | ICD-10-CM | POA: Diagnosis not present

## 2020-11-06 DIAGNOSIS — K625 Hemorrhage of anus and rectum: Secondary | ICD-10-CM | POA: Diagnosis not present

## 2020-11-06 DIAGNOSIS — N2581 Secondary hyperparathyroidism of renal origin: Secondary | ICD-10-CM | POA: Diagnosis not present

## 2020-11-06 DIAGNOSIS — K644 Residual hemorrhoidal skin tags: Secondary | ICD-10-CM | POA: Diagnosis not present

## 2020-11-06 DIAGNOSIS — D631 Anemia in chronic kidney disease: Secondary | ICD-10-CM | POA: Diagnosis not present

## 2020-11-06 DIAGNOSIS — Z992 Dependence on renal dialysis: Secondary | ICD-10-CM | POA: Diagnosis not present

## 2020-11-06 DIAGNOSIS — N186 End stage renal disease: Secondary | ICD-10-CM | POA: Diagnosis not present

## 2020-11-08 DIAGNOSIS — N186 End stage renal disease: Secondary | ICD-10-CM | POA: Diagnosis not present

## 2020-11-08 DIAGNOSIS — Z992 Dependence on renal dialysis: Secondary | ICD-10-CM | POA: Diagnosis not present

## 2020-11-08 DIAGNOSIS — D631 Anemia in chronic kidney disease: Secondary | ICD-10-CM | POA: Diagnosis not present

## 2020-11-08 DIAGNOSIS — N2581 Secondary hyperparathyroidism of renal origin: Secondary | ICD-10-CM | POA: Diagnosis not present

## 2020-11-10 DIAGNOSIS — D631 Anemia in chronic kidney disease: Secondary | ICD-10-CM | POA: Diagnosis not present

## 2020-11-10 DIAGNOSIS — N186 End stage renal disease: Secondary | ICD-10-CM | POA: Diagnosis not present

## 2020-11-10 DIAGNOSIS — Z992 Dependence on renal dialysis: Secondary | ICD-10-CM | POA: Diagnosis not present

## 2020-11-10 DIAGNOSIS — N2581 Secondary hyperparathyroidism of renal origin: Secondary | ICD-10-CM | POA: Diagnosis not present

## 2020-11-13 DIAGNOSIS — D631 Anemia in chronic kidney disease: Secondary | ICD-10-CM | POA: Diagnosis not present

## 2020-11-13 DIAGNOSIS — S90821A Blister (nonthermal), right foot, initial encounter: Secondary | ICD-10-CM | POA: Diagnosis not present

## 2020-11-13 DIAGNOSIS — Z992 Dependence on renal dialysis: Secondary | ICD-10-CM | POA: Diagnosis not present

## 2020-11-13 DIAGNOSIS — N2581 Secondary hyperparathyroidism of renal origin: Secondary | ICD-10-CM | POA: Diagnosis not present

## 2020-11-13 DIAGNOSIS — N186 End stage renal disease: Secondary | ICD-10-CM | POA: Diagnosis not present

## 2020-11-13 DIAGNOSIS — E1151 Type 2 diabetes mellitus with diabetic peripheral angiopathy without gangrene: Secondary | ICD-10-CM | POA: Diagnosis not present

## 2020-11-13 DIAGNOSIS — L03032 Cellulitis of left toe: Secondary | ICD-10-CM | POA: Diagnosis not present

## 2020-11-15 DIAGNOSIS — D631 Anemia in chronic kidney disease: Secondary | ICD-10-CM | POA: Diagnosis not present

## 2020-11-15 DIAGNOSIS — Z992 Dependence on renal dialysis: Secondary | ICD-10-CM | POA: Diagnosis not present

## 2020-11-15 DIAGNOSIS — N2581 Secondary hyperparathyroidism of renal origin: Secondary | ICD-10-CM | POA: Diagnosis not present

## 2020-11-15 DIAGNOSIS — N186 End stage renal disease: Secondary | ICD-10-CM | POA: Diagnosis not present

## 2020-11-18 DIAGNOSIS — N2581 Secondary hyperparathyroidism of renal origin: Secondary | ICD-10-CM | POA: Diagnosis not present

## 2020-11-18 DIAGNOSIS — Z992 Dependence on renal dialysis: Secondary | ICD-10-CM | POA: Diagnosis not present

## 2020-11-18 DIAGNOSIS — N186 End stage renal disease: Secondary | ICD-10-CM | POA: Diagnosis not present

## 2020-11-18 DIAGNOSIS — D631 Anemia in chronic kidney disease: Secondary | ICD-10-CM | POA: Diagnosis not present

## 2020-11-20 DIAGNOSIS — Z992 Dependence on renal dialysis: Secondary | ICD-10-CM | POA: Diagnosis not present

## 2020-11-20 DIAGNOSIS — N186 End stage renal disease: Secondary | ICD-10-CM | POA: Diagnosis not present

## 2020-11-20 DIAGNOSIS — D631 Anemia in chronic kidney disease: Secondary | ICD-10-CM | POA: Diagnosis not present

## 2020-11-20 DIAGNOSIS — N2581 Secondary hyperparathyroidism of renal origin: Secondary | ICD-10-CM | POA: Diagnosis not present

## 2020-11-22 DIAGNOSIS — N186 End stage renal disease: Secondary | ICD-10-CM | POA: Diagnosis not present

## 2020-11-22 DIAGNOSIS — N2581 Secondary hyperparathyroidism of renal origin: Secondary | ICD-10-CM | POA: Diagnosis not present

## 2020-11-22 DIAGNOSIS — D631 Anemia in chronic kidney disease: Secondary | ICD-10-CM | POA: Diagnosis not present

## 2020-11-22 DIAGNOSIS — Z992 Dependence on renal dialysis: Secondary | ICD-10-CM | POA: Diagnosis not present

## 2020-11-24 DIAGNOSIS — N186 End stage renal disease: Secondary | ICD-10-CM | POA: Diagnosis not present

## 2020-11-24 DIAGNOSIS — Z992 Dependence on renal dialysis: Secondary | ICD-10-CM | POA: Diagnosis not present

## 2020-11-24 DIAGNOSIS — E1122 Type 2 diabetes mellitus with diabetic chronic kidney disease: Secondary | ICD-10-CM | POA: Diagnosis not present

## 2020-11-25 DIAGNOSIS — N2581 Secondary hyperparathyroidism of renal origin: Secondary | ICD-10-CM | POA: Diagnosis not present

## 2020-11-25 DIAGNOSIS — Z992 Dependence on renal dialysis: Secondary | ICD-10-CM | POA: Diagnosis not present

## 2020-11-25 DIAGNOSIS — N186 End stage renal disease: Secondary | ICD-10-CM | POA: Diagnosis not present

## 2020-11-25 DIAGNOSIS — D631 Anemia in chronic kidney disease: Secondary | ICD-10-CM | POA: Diagnosis not present

## 2020-11-27 DIAGNOSIS — Z992 Dependence on renal dialysis: Secondary | ICD-10-CM | POA: Diagnosis not present

## 2020-11-27 DIAGNOSIS — N2581 Secondary hyperparathyroidism of renal origin: Secondary | ICD-10-CM | POA: Diagnosis not present

## 2020-11-27 DIAGNOSIS — N186 End stage renal disease: Secondary | ICD-10-CM | POA: Diagnosis not present

## 2020-11-27 DIAGNOSIS — D631 Anemia in chronic kidney disease: Secondary | ICD-10-CM | POA: Diagnosis not present

## 2020-11-29 DIAGNOSIS — N2581 Secondary hyperparathyroidism of renal origin: Secondary | ICD-10-CM | POA: Diagnosis not present

## 2020-11-29 DIAGNOSIS — Z992 Dependence on renal dialysis: Secondary | ICD-10-CM | POA: Diagnosis not present

## 2020-11-29 DIAGNOSIS — D631 Anemia in chronic kidney disease: Secondary | ICD-10-CM | POA: Diagnosis not present

## 2020-11-29 DIAGNOSIS — N186 End stage renal disease: Secondary | ICD-10-CM | POA: Diagnosis not present

## 2020-12-01 DIAGNOSIS — N2581 Secondary hyperparathyroidism of renal origin: Secondary | ICD-10-CM | POA: Diagnosis not present

## 2020-12-01 DIAGNOSIS — Z992 Dependence on renal dialysis: Secondary | ICD-10-CM | POA: Diagnosis not present

## 2020-12-01 DIAGNOSIS — D631 Anemia in chronic kidney disease: Secondary | ICD-10-CM | POA: Diagnosis not present

## 2020-12-01 DIAGNOSIS — N186 End stage renal disease: Secondary | ICD-10-CM | POA: Diagnosis not present

## 2020-12-03 ENCOUNTER — Ambulatory Visit (INDEPENDENT_AMBULATORY_CARE_PROVIDER_SITE_OTHER): Payer: Medicare Other | Admitting: Surgery

## 2020-12-03 ENCOUNTER — Encounter: Payer: Self-pay | Admitting: Surgery

## 2020-12-03 ENCOUNTER — Other Ambulatory Visit: Payer: Self-pay

## 2020-12-03 ENCOUNTER — Ambulatory Visit (INDEPENDENT_AMBULATORY_CARE_PROVIDER_SITE_OTHER)
Admission: RE | Admit: 2020-12-03 | Discharge: 2020-12-03 | Disposition: A | Discharge status: Home / Self Care | Payer: Medicare Other | Source: Ambulatory Visit | Attending: Surgery | Admitting: Surgery

## 2020-12-03 ENCOUNTER — Ambulatory Visit (HOSPITAL_COMMUNITY)
Admission: RE | Admit: 2020-12-03 | Discharge: 2020-12-03 | Disposition: A | Discharge status: Home / Self Care | Payer: Medicare Other | Source: Ambulatory Visit | Attending: Surgery | Admitting: Surgery

## 2020-12-03 VITALS — BP 110/67 | HR 83 | Temp 98.0°F | Resp 20 | Ht 70.0 in | Wt 200.0 lb

## 2020-12-03 DIAGNOSIS — I70234 Atherosclerosis of native arteries of right leg with ulceration of heel and midfoot: Secondary | ICD-10-CM

## 2020-12-03 DIAGNOSIS — I739 Peripheral vascular disease, unspecified: Secondary | ICD-10-CM | POA: Diagnosis not present

## 2020-12-03 MED ORDER — SODIUM CHLORIDE 0.9% FLUSH: 3.0000 mL | Freq: Two times a day (BID) | INTRAVENOUS | Status: DC

## 2020-12-03 MED ORDER — SODIUM CHLORIDE 0.9% FLUSH: 3.0000 mL | INTRAVENOUS | Status: DC | PRN

## 2020-12-03 MED ORDER — SODIUM CHLORIDE 0.9 % IV SOLN: 250.0000 mL | INTRAVENOUS | Status: DC | PRN

## 2020-12-03 NOTE — H&P (View-Only) (Signed)
Vascular and Vein Specialist of [facility]  Patient name: Christian Dalton MRN: [id number] DOB: 09/11/1941 Sex: male   REQUESTING PROVIDER:    podiatry   REASON FOR CONSULT:    PAD  HISTORY OF PRESENT ILLNESS:   Demarcus Wimer is a 80 y.o. male, who is referred for evaluation of peripheral vascular disease in the lower extremities.  He has undergone left great toenail removal which has been healing.  He also has developed a blister on the right heel and dorsum of the foot.  He is referred for this reason.  The patient suffers from end-stage renal disease on dialysis secondary to diabetes and hypertension.  He is a non-smoker.  He has COPD.  He has a history of coronary artery disease.  He had a mild nonobstructive pattern on cardiac catheterization in 2016.  He takes a statin for hypercholesterolemia.  PAST MEDICAL HISTORY    Past Medical History:  Diagnosis Date  . Acute cholecystitis 08/17/2018  . Anemia   . Arthritis   . Atrial fibrillation ([place])   . CAD (coronary artery disease)   . Cholecystitis   . COPD (chronic obstructive pulmonary disease) ([place])   . Depression   . Diabetes mellitus without complication ([place])   . ESRD (end stage renal disease) on dialysis ([place])   . HOH (hard of hearing)   . Hypertension   . Hypothyroidism   . OSA (obstructive sleep apnea)    does not wear cpap  . Renal disorder   . Spinal stenosis   . Wears dentures    wears top dentures  . Wears glasses   . Wears hearing aid in both ears      FAMILY HISTORY   Family History  Problem Relation Age of Onset  . Heart disease Mother   . Heart disease Father   . Heart disease Sister     SOCIAL HISTORY:   Social History   Socioeconomic History  . Marital status: Married    Spouse name: Di Kindle  . Number of children: Not on file  . Years of education: Not on file  . Highest education level: Not on file  Occupational History  . Not on file  Tobacco Use   . Smoking status: Never Smoker  . Smokeless tobacco: Former Systems developer    Types: Secondary school teacher  . Vaping Use: Never used  Substance and Sexual Activity  . Alcohol use: Not Currently  . Drug use: No  . Sexual activity: Not on file  Other Topics Concern  . Not on file  Social History Narrative  . Not on file   Social Determinants of Health   Financial Resource Strain: Not on file  Food Insecurity: Not on file  Transportation Needs: Not on file  Physical Activity: Not on file  Stress: Not on file  Social Connections: Not on file  Intimate Partner Violence: Not on file    ALLERGIES:    Allergies  Allergen Reactions  . Avelox [Moxifloxacin Hcl] Other (See Comments)    Hallucinations   . Avelox [Moxifloxacin]     Other reaction(s): hallucinations  . Gabapentin Other (See Comments)    Caused staggering and red feet  . Metoprolol Other (See Comments)    Dropped pulse too low; was taken off of this by MD  . Shellfish Allergy Nausea And Vomiting and Other (See Comments)    Made patient VERY nauseous and he developed stomach cramps  . Sulfa Antibiotics Other (See Comments)    Allergy  is from childhood (reaction not recalled)  . Tetracyclines & Related Other (See Comments)    Blisters on hands and arms  . Adhesive [Tape] Itching and Rash    Paper tape only- skin cannot tolerate "heavy" tapes    CURRENT MEDICATIONS:    Current Outpatient Medications  Medication Sig Dispense Refill  . aspirin EC 81 MG tablet Take 81 mg by mouth at bedtime.     Marland Kitchen atorvastatin (LIPITOR) 10 MG tablet Take 10 mg by mouth at bedtime.    Marland Kitchen buPROPion (WELLBUTRIN XL) 150 MG 24 hr tablet Take 150 mg by mouth every evening.     . docusate sodium (COLACE) 100 MG capsule Take 100 mg by mouth 2 (two) times daily.     Marland Kitchen escitalopram (LEXAPRO) 10 MG tablet Take 10 mg by mouth at bedtime.     . fluticasone (FLONASE) 50 MCG/ACT nasal spray Place 1 spray into both nostrils daily as needed for allergies or  rhinitis.    Marland Kitchen insulin detemir (LEVEMIR) 100 UNIT/ML injection Inject 0.14 mLs (14 Units total) into the skin 2 (two) times daily. (Patient not taking: Reported on 07/26/2020) 10 mL 11  . lactulose, encephalopathy, (CHRONULAC) 10 GM/15ML SOLN Take 10 g by mouth every evening.   4  . levothyroxine (SYNTHROID, LEVOTHROID) 125 MCG tablet Take 125 mcg by mouth daily.     Marland Kitchen lidocaine-prilocaine (EMLA) cream Apply 1 application topically daily as needed (port access).   4  . linaclotide (LINZESS) 290 MCG CAPS capsule Take 290 mcg by mouth every evening.     . loratadine (CLARITIN) 10 MG tablet Take 10 mg by mouth daily.     . montelukast (SINGULAIR) 10 MG tablet Take 10 mg every evening by mouth.    . nitroGLYCERIN (NITROSTAT) 0.4 MG SL tablet Place 0.4 mg under the tongue every 5 (five) minutes as needed for chest pain.    Marland Kitchen oxyCODONE-acetaminophen (PERCOCET) 10-325 MG tablet Take 1 tablet by mouth 2 (two) times daily as needed for pain.     . pantoprazole (PROTONIX) 40 MG tablet Take 1 tablet (40 mg total) by mouth 2 (two) times daily. 60 tablet 2  . Polyethyl Glycol-Propyl Glycol (SYSTANE OP) Place 1 drop into both eyes daily as needed (dry eyes).    . potassium chloride (KLOR-CON) 10 MEQ tablet Take 2 tablets (20 mEq total) by mouth daily for 14 days. 28 tablet 0  . PROAIR HFA 108 (90 Base) MCG/ACT inhaler Inhale 2 puffs into the lungs every 6 (six) hours as needed for shortness of breath.    . sevelamer carbonate (RENVELA) 800 MG tablet Take 3 tablets (2,400 mg total) by mouth 3 (three) times daily with meals. (Patient taking differently: Take 2,400 mg by mouth 2 (two) times daily with a meal. ) 45 tablet 0   No current facility-administered medications for this visit.    REVIEW OF SYSTEMS:   [X]  denotes positive finding, [ ]  denotes negative finding Cardiac  Comments:  Chest pain or chest pressure:    Shortness of breath upon exertion:    Short of breath when lying flat:    Irregular heart  rhythm:        Vascular    Pain in calf, thigh, or hip brought on by ambulation:    Pain in feet at night that wakes you up from your sleep:     Blood clot in your veins:    Leg swelling:  Pulmonary    Oxygen at home:    Productive cough:     Wheezing:         Neurologic    Sudden weakness in arms or legs:     Sudden numbness in arms or legs:     Sudden onset of difficulty speaking or slurred speech:    Temporary loss of vision in one eye:     Problems with dizziness:         Gastrointestinal    Blood in stool:      Vomited blood:         Genitourinary    Burning when urinating:     Blood in urine:        Psychiatric    Major depression:         Hematologic    Bleeding problems:    Problems with blood clotting too easily:        Skin    Rashes or ulcers: x       Constitutional    Fever or chills:     PHYSICAL EXAM:   There were no vitals filed for this visit.  GENERAL: The patient is a well-nourished male, in no acute distress. The vital signs are documented above. CARDIAC: There is a regular rate and rhythm.  VASCULAR: Nonpalpable pedal pulses PULMONARY: Nonlabored respirations ABDOMEN: Soft and non-tender with normal pitched bowel sounds.  MUSCULOSKELETAL: There are no major deformities or cyanosis. NEUROLOGIC: No focal weakness or paresthesias are detected. SKIN: Blistering and superficial skin ulceration on the heel and dorsum of the right foot PSYCHIATRIC: The patient has a normal affect.  STUDIES:   I have reviewed the following: +-------+-----------+-----------+------------+------------+  ABI/TBIToday's ABIToday's TBIPrevious ABIPrevious TBI  +-------+-----------+-----------+------------+------------+  Right [place]     0.70    [place]     0.72      +-------+-----------+-----------+------------+------------+  Left  [place]     0.80    [place]     0.81       +-------+-----------+-----------+------------+------------+  Left toe pressure is 95 Right toe pressure is 83  Right: Patent right lower extremity, probable tibial artery occlusive  disease.   Left: Normal examination. No evidence of arterial occlusive disease.  Calcific plaque noted throughout bilaterally.  ASSESSMENT and PLAN   PAD with right foot ulceration: Duplex today suggests no significant inflow or outflow disease.  There is most likely tibial occlusive versus pedal disease on the right foot given the drop in his toe pressures.  Because this potentially could turn into a limb threatening situation, I think catheter-based angiography is warranted to define his anatomy and intervene if indicated.  This has been scheduled for Tuesday, January 18.  He will reschedule his dialysis as this is a normal dialysis day.  I discussed the risks and benefits of the procedure including hematoma and distal embolization.  All questions were answered.   Leia Alf, MD, FACS [facility] Vein Specialists of [facility] [phone] Pager [phone]

## 2020-12-03 NOTE — Progress Notes (Signed)
Vascular and Vein Specialist of [facility]  Patient name: Christian Dalton MRN: [id number] DOB: [birth year] Sex: male   REQUESTING PROVIDER:    podiatry   REASON FOR CONSULT:    PAD  HISTORY OF PRESENT ILLNESS:   Christian Dalton is a 80 y.o. male, who is referred for evaluation of peripheral vascular disease in the lower extremities.  He has undergone left great toenail removal which has been healing.  He also has developed a blister on the right heel and dorsum of the foot.  He is referred for this reason.  The patient suffers from end-stage renal disease on dialysis secondary to diabetes and hypertension.  He is a non-smoker.  He has COPD.  He has a history of coronary artery disease.  He had a mild nonobstructive pattern on cardiac catheterization in 2016.  He takes a statin for hypercholesterolemia.  PAST MEDICAL HISTORY    Past Medical History:  Diagnosis Date  . Acute cholecystitis 08/17/2018  . Anemia   . Arthritis   . Atrial fibrillation ([place])   . CAD (coronary artery disease)   . Cholecystitis   . COPD (chronic obstructive pulmonary disease) ([place])   . Depression   . Diabetes mellitus without complication ([place])   . ESRD (end stage renal disease) on dialysis ([place])   . HOH (hard of hearing)   . Hypertension   . Hypothyroidism   . OSA (obstructive sleep apnea)    does not wear cpap  . Renal disorder   . Spinal stenosis   . Wears dentures    wears top dentures  . Wears glasses   . Wears hearing aid in both ears      FAMILY HISTORY   Family History  Problem Relation Age of Onset  . Heart disease Mother   . Heart disease Father   . Heart disease Sister     SOCIAL HISTORY:   Social History   Socioeconomic History  . Marital status: Married    Spouse name: Di Kindle  . Number of children: Not on file  . Years of education: Not on file  . Highest education level: Not on file  Occupational History  . Not on file  Tobacco Use   . Smoking status: Never Smoker  . Smokeless tobacco: Former Systems developer    Types: Secondary school teacher  . Vaping Use: Never used  Substance and Sexual Activity  . Alcohol use: Not Currently  . Drug use: No  . Sexual activity: Not on file  Other Topics Concern  . Not on file  Social History Narrative  . Not on file   Social Determinants of Health   Financial Resource Strain: Not on file  Food Insecurity: Not on file  Transportation Needs: Not on file  Physical Activity: Not on file  Stress: Not on file  Social Connections: Not on file  Intimate Partner Violence: Not on file    ALLERGIES:    Allergies  Allergen Reactions  . Avelox [Moxifloxacin Hcl] Other (See Comments)    Hallucinations   . Avelox [Moxifloxacin]     Other reaction(s): hallucinations  . Gabapentin Other (See Comments)    Caused staggering and red feet  . Metoprolol Other (See Comments)    Dropped pulse too low; was taken off of this by MD  . Shellfish Allergy Nausea And Vomiting and Other (See Comments)    Made patient VERY nauseous and he developed stomach cramps  . Sulfa Antibiotics Other (See Comments)    Allergy  is from childhood (reaction not recalled)  . Tetracyclines & Related Other (See Comments)    Blisters on hands and arms  . Adhesive [Tape] Itching and Rash    Paper tape only- skin cannot tolerate "heavy" tapes    CURRENT MEDICATIONS:    Current Outpatient Medications  Medication Sig Dispense Refill  . aspirin EC 81 MG tablet Take 81 mg by mouth at bedtime.     Marland Kitchen atorvastatin (LIPITOR) 10 MG tablet Take 10 mg by mouth at bedtime.    Marland Kitchen buPROPion (WELLBUTRIN XL) 150 MG 24 hr tablet Take 150 mg by mouth every evening.     . docusate sodium (COLACE) 100 MG capsule Take 100 mg by mouth 2 (two) times daily.     Marland Kitchen escitalopram (LEXAPRO) 10 MG tablet Take 10 mg by mouth at bedtime.     . fluticasone (FLONASE) 50 MCG/ACT nasal spray Place 1 spray into both nostrils daily as needed for allergies or  rhinitis.    Marland Kitchen insulin detemir (LEVEMIR) 100 UNIT/ML injection Inject 0.14 mLs (14 Units total) into the skin 2 (two) times daily. (Patient not taking: Reported on 07/26/2020) 10 mL 11  . lactulose, encephalopathy, (CHRONULAC) 10 GM/15ML SOLN Take 10 g by mouth every evening.   4  . levothyroxine (SYNTHROID, LEVOTHROID) 125 MCG tablet Take 125 mcg by mouth daily.     Marland Kitchen lidocaine-prilocaine (EMLA) cream Apply 1 application topically daily as needed (port access).   4  . linaclotide (LINZESS) 290 MCG CAPS capsule Take 290 mcg by mouth every evening.     . loratadine (CLARITIN) 10 MG tablet Take 10 mg by mouth daily.     . montelukast (SINGULAIR) 10 MG tablet Take 10 mg every evening by mouth.    . nitroGLYCERIN (NITROSTAT) 0.4 MG SL tablet Place 0.4 mg under the tongue every 5 (five) minutes as needed for chest pain.    Marland Kitchen oxyCODONE-acetaminophen (PERCOCET) 10-325 MG tablet Take 1 tablet by mouth 2 (two) times daily as needed for pain.     . pantoprazole (PROTONIX) 40 MG tablet Take 1 tablet (40 mg total) by mouth 2 (two) times daily. 60 tablet 2  . Polyethyl Glycol-Propyl Glycol (SYSTANE OP) Place 1 drop into both eyes daily as needed (dry eyes).    . potassium chloride (KLOR-CON) 10 MEQ tablet Take 2 tablets (20 mEq total) by mouth daily for 14 days. 28 tablet 0  . PROAIR HFA 108 (90 Base) MCG/ACT inhaler Inhale 2 puffs into the lungs every 6 (six) hours as needed for shortness of breath.    . sevelamer carbonate (RENVELA) 800 MG tablet Take 3 tablets (2,400 mg total) by mouth 3 (three) times daily with meals. (Patient taking differently: Take 2,400 mg by mouth 2 (two) times daily with a meal. ) 45 tablet 0   No current facility-administered medications for this visit.    REVIEW OF SYSTEMS:   [X]  denotes positive finding, [ ]  denotes negative finding Cardiac  Comments:  Chest pain or chest pressure:    Shortness of breath upon exertion:    Short of breath when lying flat:    Irregular heart  rhythm:        Vascular    Pain in calf, thigh, or hip brought on by ambulation:    Pain in feet at night that wakes you up from your sleep:     Blood clot in your veins:    Leg swelling:  Pulmonary    Oxygen at home:    Productive cough:     Wheezing:         Neurologic    Sudden weakness in arms or legs:     Sudden numbness in arms or legs:     Sudden onset of difficulty speaking or slurred speech:    Temporary loss of vision in one eye:     Problems with dizziness:         Gastrointestinal    Blood in stool:      Vomited blood:         Genitourinary    Burning when urinating:     Blood in urine:        Psychiatric    Major depression:         Hematologic    Bleeding problems:    Problems with blood clotting too easily:        Skin    Rashes or ulcers: x       Constitutional    Fever or chills:     PHYSICAL EXAM:   There were no vitals filed for this visit.  GENERAL: The patient is a well-nourished male, in no acute distress. The vital signs are documented above. CARDIAC: There is a regular rate and rhythm.  VASCULAR: Nonpalpable pedal pulses PULMONARY: Nonlabored respirations ABDOMEN: Soft and non-tender with normal pitched bowel sounds.  MUSCULOSKELETAL: There are no major deformities or cyanosis. NEUROLOGIC: No focal weakness or paresthesias are detected. SKIN: Blistering and superficial skin ulceration on the heel and dorsum of the right foot PSYCHIATRIC: The patient has a normal affect.  STUDIES:   I have reviewed the following: +-------+-----------+-----------+------------+------------+  ABI/TBIToday's ABIToday's TBIPrevious ABIPrevious TBI  +-------+-----------+-----------+------------+------------+  Right [place]     0.70    [place]     0.72      +-------+-----------+-----------+------------+------------+  Left  [place]     0.80    [place]     0.81       +-------+-----------+-----------+------------+------------+  Left toe pressure is 95 Right toe pressure is 83  Right: Patent right lower extremity, probable tibial artery occlusive  disease.   Left: Normal examination. No evidence of arterial occlusive disease.  Calcific plaque noted throughout bilaterally.  ASSESSMENT and PLAN   PAD with right foot ulceration: Duplex today suggests no significant inflow or outflow disease.  There is most likely tibial occlusive versus pedal disease on the right foot given the drop in his toe pressures.  Because this potentially could turn into a limb threatening situation, I think catheter-based angiography is warranted to define his anatomy and intervene if indicated.  This has been scheduled for Tuesday, January 18.  He will reschedule his dialysis as this is a normal dialysis day.  I discussed the risks and benefits of the procedure including hematoma and distal embolization.  All questions were answered.   Leia Alf, MD, FACS Vascular and Vein Specialists of [facility] [phone] Pager [phone]

## 2020-12-04 DIAGNOSIS — Z992 Dependence on renal dialysis: Secondary | ICD-10-CM | POA: Diagnosis not present

## 2020-12-04 DIAGNOSIS — N186 End stage renal disease: Secondary | ICD-10-CM | POA: Diagnosis not present

## 2020-12-04 DIAGNOSIS — D631 Anemia in chronic kidney disease: Secondary | ICD-10-CM | POA: Diagnosis not present

## 2020-12-04 DIAGNOSIS — N2581 Secondary hyperparathyroidism of renal origin: Secondary | ICD-10-CM | POA: Diagnosis not present

## 2020-12-06 DIAGNOSIS — N2581 Secondary hyperparathyroidism of renal origin: Secondary | ICD-10-CM | POA: Diagnosis not present

## 2020-12-06 DIAGNOSIS — E1121 Type 2 diabetes mellitus with diabetic nephropathy: Secondary | ICD-10-CM | POA: Diagnosis not present

## 2020-12-06 DIAGNOSIS — N186 End stage renal disease: Secondary | ICD-10-CM | POA: Diagnosis not present

## 2020-12-06 DIAGNOSIS — Z992 Dependence on renal dialysis: Secondary | ICD-10-CM | POA: Diagnosis not present

## 2020-12-06 DIAGNOSIS — D631 Anemia in chronic kidney disease: Secondary | ICD-10-CM | POA: Diagnosis not present

## 2020-12-08 ENCOUNTER — Other Ambulatory Visit (HOSPITAL_COMMUNITY)
Admission: RE | Admit: 2020-12-08 | Discharge: 2020-12-08 | Disposition: A | Discharge status: Home / Self Care | Payer: Medicare Other | Source: Ambulatory Visit | Attending: Surgery | Admitting: Surgery

## 2020-12-08 DIAGNOSIS — N186 End stage renal disease: Secondary | ICD-10-CM | POA: Diagnosis not present

## 2020-12-08 DIAGNOSIS — Z20822 Contact with and (suspected) exposure to covid-19: Secondary | ICD-10-CM | POA: Insufficient documentation

## 2020-12-08 DIAGNOSIS — Z992 Dependence on renal dialysis: Secondary | ICD-10-CM | POA: Diagnosis not present

## 2020-12-08 DIAGNOSIS — N2581 Secondary hyperparathyroidism of renal origin: Secondary | ICD-10-CM | POA: Diagnosis not present

## 2020-12-08 DIAGNOSIS — Z01812 Encounter for preprocedural laboratory examination: Secondary | ICD-10-CM | POA: Insufficient documentation

## 2020-12-08 DIAGNOSIS — D631 Anemia in chronic kidney disease: Secondary | ICD-10-CM | POA: Diagnosis not present

## 2020-12-08 LAB — SARS CORONAVIRUS 2 (TAT 6-24 HRS): SARS Coronavirus 2: NEGATIVE

## 2020-12-10 ENCOUNTER — Other Ambulatory Visit (HOSPITAL_COMMUNITY): Payer: Medicare Other

## 2020-12-10 DIAGNOSIS — Z992 Dependence on renal dialysis: Secondary | ICD-10-CM | POA: Diagnosis not present

## 2020-12-10 DIAGNOSIS — N186 End stage renal disease: Secondary | ICD-10-CM | POA: Diagnosis not present

## 2020-12-10 DIAGNOSIS — N2581 Secondary hyperparathyroidism of renal origin: Secondary | ICD-10-CM | POA: Diagnosis not present

## 2020-12-10 DIAGNOSIS — D631 Anemia in chronic kidney disease: Secondary | ICD-10-CM | POA: Diagnosis not present

## 2020-12-11 ENCOUNTER — Encounter (HOSPITAL_COMMUNITY): Payer: Self-pay | Admitting: Surgery

## 2020-12-11 ENCOUNTER — Encounter (HOSPITAL_COMMUNITY)
Admission: RE | Disposition: A | Discharge status: Home / Self Care | Payer: Self-pay | Source: Home / Self Care | Attending: Surgery

## 2020-12-11 ENCOUNTER — Ambulatory Visit (HOSPITAL_COMMUNITY)
Admission: RE | Admit: 2020-12-11 | Discharge: 2020-12-11 | Disposition: A | Discharge status: Home / Self Care | Payer: Medicare Other | Attending: Surgery | Admitting: Surgery

## 2020-12-11 ENCOUNTER — Other Ambulatory Visit: Payer: Self-pay

## 2020-12-11 DIAGNOSIS — E1122 Type 2 diabetes mellitus with diabetic chronic kidney disease: Secondary | ICD-10-CM | POA: Diagnosis not present

## 2020-12-11 DIAGNOSIS — Z794 Long term (current) use of insulin: Secondary | ICD-10-CM | POA: Insufficient documentation

## 2020-12-11 DIAGNOSIS — Z882 Allergy status to sulfonamides status: Secondary | ICD-10-CM | POA: Insufficient documentation

## 2020-12-11 DIAGNOSIS — Z87891 Personal history of nicotine dependence: Secondary | ICD-10-CM | POA: Insufficient documentation

## 2020-12-11 DIAGNOSIS — Z79899 Other long term (current) drug therapy: Secondary | ICD-10-CM | POA: Diagnosis not present

## 2020-12-11 DIAGNOSIS — E7800 Pure hypercholesterolemia, unspecified: Secondary | ICD-10-CM | POA: Diagnosis not present

## 2020-12-11 DIAGNOSIS — I70234 Atherosclerosis of native arteries of right leg with ulceration of heel and midfoot: Secondary | ICD-10-CM | POA: Diagnosis not present

## 2020-12-11 DIAGNOSIS — Z91013 Allergy to seafood: Secondary | ICD-10-CM | POA: Diagnosis not present

## 2020-12-11 DIAGNOSIS — L97519 Non-pressure chronic ulcer of other part of right foot with unspecified severity: Secondary | ICD-10-CM | POA: Insufficient documentation

## 2020-12-11 DIAGNOSIS — I2510 Atherosclerotic heart disease of native coronary artery without angina pectoris: Secondary | ICD-10-CM | POA: Diagnosis not present

## 2020-12-11 DIAGNOSIS — Z992 Dependence on renal dialysis: Secondary | ICD-10-CM | POA: Insufficient documentation

## 2020-12-11 DIAGNOSIS — Z79890 Hormone replacement therapy (postmenopausal): Secondary | ICD-10-CM | POA: Diagnosis not present

## 2020-12-11 DIAGNOSIS — E1151 Type 2 diabetes mellitus with diabetic peripheral angiopathy without gangrene: Secondary | ICD-10-CM | POA: Diagnosis not present

## 2020-12-11 DIAGNOSIS — J449 Chronic obstructive pulmonary disease, unspecified: Secondary | ICD-10-CM | POA: Insufficient documentation

## 2020-12-11 DIAGNOSIS — N186 End stage renal disease: Secondary | ICD-10-CM | POA: Diagnosis not present

## 2020-12-11 DIAGNOSIS — E11621 Type 2 diabetes mellitus with foot ulcer: Secondary | ICD-10-CM | POA: Diagnosis not present

## 2020-12-11 DIAGNOSIS — Z888 Allergy status to other drugs, medicaments and biological substances status: Secondary | ICD-10-CM | POA: Insufficient documentation

## 2020-12-11 DIAGNOSIS — I120 Hypertensive chronic kidney disease with stage 5 chronic kidney disease or end stage renal disease: Secondary | ICD-10-CM | POA: Insufficient documentation

## 2020-12-11 DIAGNOSIS — Z7982 Long term (current) use of aspirin: Secondary | ICD-10-CM | POA: Diagnosis not present

## 2020-12-11 DIAGNOSIS — I70235 Atherosclerosis of native arteries of right leg with ulceration of other part of foot: Secondary | ICD-10-CM | POA: Diagnosis not present

## 2020-12-11 HISTORY — PX: ABDOMINAL AORTOGRAM W/LOWER EXTREMITY: CATH118223

## 2020-12-11 HISTORY — PX: PERIPHERAL VASCULAR BALLOON ANGIOPLASTY: CATH118281

## 2020-12-11 LAB — POCT ACTIVATED CLOTTING TIME
Activated Clotting Time: 202 seconds
Activated Clotting Time: 261 seconds

## 2020-12-11 LAB — POCT I-STAT, CHEM 8
BUN: 34 mg/dL — ABNORMAL HIGH (ref 8–23)
Calcium, Ion: 1.15 mmol/L (ref 1.15–1.40)
Chloride: 97 mmol/L — ABNORMAL LOW (ref 98–111)
Creatinine, Ser: 7.4 mg/dL — ABNORMAL HIGH (ref 0.61–1.24)
Glucose, Bld: 140 mg/dL — ABNORMAL HIGH (ref 70–99)
HCT: 42 % (ref 39.0–52.0)
Hemoglobin: 14.3 g/dL (ref 13.0–17.0)
Potassium: 5 mmol/L (ref 3.5–5.1)
Sodium: 136 mmol/L (ref 135–145)
TCO2: 33 mmol/L — ABNORMAL HIGH (ref 22–32)

## 2020-12-11 LAB — GLUCOSE, CAPILLARY: Glucose-Capillary: 122 mg/dL — ABNORMAL HIGH (ref 70–99)

## 2020-12-11 SURGERY — ABDOMINAL AORTOGRAM W/LOWER EXTREMITY
Anesthesia: LOCAL | Laterality: Right

## 2020-12-11 MED ORDER — SODIUM CHLORIDE 0.9% FLUSH: 3.0000 mL | INTRAVENOUS | Status: DC | PRN

## 2020-12-11 MED ORDER — MORPHINE SULFATE (PF) 4 MG/ML IV SOLN: 4.0000 mg | INTRAVENOUS | Status: DC | PRN

## 2020-12-11 MED ORDER — HEPARIN (PORCINE) IN NACL 1000-0.9 UT/500ML-% IV SOLN
INTRAVENOUS | Status: AC
  Filled 2020-12-11: qty 1000

## 2020-12-11 MED ORDER — LIDOCAINE HCL (PF) 1 % IJ SOLN
INTRAMUSCULAR | Status: AC
  Filled 2020-12-11: qty 30

## 2020-12-11 MED ORDER — LIDOCAINE HCL (PF) 1 % IJ SOLN
INTRAMUSCULAR | Status: DC | PRN
  Administered 2020-12-11: 20 mL
  Administered 2020-12-11: 2 mL

## 2020-12-11 MED ORDER — MIDAZOLAM HCL 2 MG/2ML IJ SOLN
INTRAMUSCULAR | Status: DC | PRN
  Administered 2020-12-11: 1 mg via INTRAVENOUS
  Administered 2020-12-11: 2 mg via INTRAVENOUS
  Administered 2020-12-11: 1 mg via INTRAVENOUS
  Administered 2020-12-11: 2 mg via INTRAVENOUS

## 2020-12-11 MED ORDER — FENTANYL CITRATE (PF) 100 MCG/2ML IJ SOLN
INTRAMUSCULAR | Status: AC
  Filled 2020-12-11: qty 2

## 2020-12-11 MED ORDER — FENTANYL CITRATE (PF) 100 MCG/2ML IJ SOLN
INTRAMUSCULAR | Status: DC | PRN
  Administered 2020-12-11: 25 ug via INTRAVENOUS
  Administered 2020-12-11 (×2): 50 ug via INTRAVENOUS

## 2020-12-11 MED ORDER — SODIUM CHLORIDE 0.9% FLUSH: 3.0000 mL | Freq: Two times a day (BID) | INTRAVENOUS | Status: DC

## 2020-12-11 MED ORDER — IODIXANOL 320 MG/ML IV SOLN
INTRAVENOUS | Status: DC | PRN
  Administered 2020-12-11: 200 mL

## 2020-12-11 MED ORDER — MIDAZOLAM HCL 2 MG/2ML IJ SOLN
INTRAMUSCULAR | Status: AC
  Filled 2020-12-11: qty 2

## 2020-12-11 MED ORDER — SODIUM CHLORIDE 0.9 % IV SOLN: 250.0000 mL | INTRAVENOUS | Status: DC | PRN

## 2020-12-11 MED ORDER — CLOPIDOGREL BISULFATE 75 MG PO TABS: 75.0000 mg | ORAL_TABLET | Freq: Every day | ORAL | 11 refills | Status: DC

## 2020-12-11 MED ORDER — ONDANSETRON HCL 4 MG/2ML IJ SOLN: 4.0000 mg | Freq: Four times a day (QID) | INTRAMUSCULAR | Status: DC | PRN

## 2020-12-11 MED ORDER — HEPARIN SODIUM (PORCINE) 1000 UNIT/ML IJ SOLN
INTRAMUSCULAR | Status: AC
  Filled 2020-12-11: qty 1

## 2020-12-11 MED ORDER — HYDRALAZINE HCL 20 MG/ML IJ SOLN: 5.0000 mg | INTRAMUSCULAR | Status: DC | PRN

## 2020-12-11 MED ORDER — HEPARIN (PORCINE) IN NACL 1000-0.9 UT/500ML-% IV SOLN
INTRAVENOUS | Status: DC | PRN
  Administered 2020-12-11 (×2): 500 mL

## 2020-12-11 MED ORDER — CLOPIDOGREL BISULFATE 75 MG PO TABS: 75.0000 mg | ORAL_TABLET | Freq: Every day | ORAL | Status: DC

## 2020-12-11 MED ORDER — HEPARIN SODIUM (PORCINE) 1000 UNIT/ML IJ SOLN
INTRAMUSCULAR | Status: DC | PRN
  Administered 2020-12-11: 9000 [IU] via INTRAVENOUS
  Administered 2020-12-11: 1000 [IU] via INTRAVENOUS
  Administered 2020-12-11: 2000 [IU] via INTRAVENOUS

## 2020-12-11 SURGICAL SUPPLY — 32 items
BAG SNAP BAND KOVER 36X36 (MISCELLANEOUS) ×3 IMPLANT
BALLN STERLING OTW 3X100X150 (BALLOONS) ×3
BALLOON STERLING OTW 3X100X150 (BALLOONS) ×2 IMPLANT
CATH CXI 2.6F 65 ANG (CATHETERS) ×1
CATH CXI 2.6F 65 ST (CATHETERS) ×1
CATH NAVICROSS ANGLED 90CM (MICROCATHETER) ×3 IMPLANT
CATH OMNI FLUSH 5F 65CM (CATHETERS) ×3 IMPLANT
CATH SOFT-VU ST 4F 90CM (CATHETERS) ×3 IMPLANT
CATH SPRT ANG 65X2.3FR PLATN (CATHETERS) ×2 IMPLANT
CATH SPRT STRG 65X2.6FR ACPT (CATHETERS) ×2 IMPLANT
CLOSURE MYNX CONTROL 6F/7F (Vascular Products) ×3 IMPLANT
COVER DOME SNAP 22 D (MISCELLANEOUS) ×6 IMPLANT
DEVICE TORQUE H2O (MISCELLANEOUS) ×3 IMPLANT
GUIDEWIRE ANGLED .035X150CM (WIRE) ×3 IMPLANT
GUIDEWIRE ZILIENT 12G 018 (WIRE) ×3 IMPLANT
GUIDEWIRE ZILIENT 6G 018 (WIRE) ×3 IMPLANT
KIT ENCORE 26 ADVANTAGE (KITS) ×3 IMPLANT
KIT MICROPUNCTURE NIT STIFF (SHEATH) ×3 IMPLANT
KIT PV (KITS) ×3 IMPLANT
SHEATH DESTINATION MP 6FR 90CM (SHEATH) ×3 IMPLANT
SHEATH MICROPUNCTURE PEDAL 4FR (SHEATH) ×3 IMPLANT
SHEATH PINNACLE 5F 10CM (SHEATH) ×3 IMPLANT
SHEATH PINNACLE 6F 10CM (SHEATH) ×3 IMPLANT
SHEATH PROBE COVER 6X72 (BAG) ×6 IMPLANT
SHIELD RADPAD SCOOP 12X17 (MISCELLANEOUS) ×3 IMPLANT
SYR MEDRAD MARK V 150ML (SYRINGE) ×3 IMPLANT
TRANSDUCER W/STOPCOCK (MISCELLANEOUS) ×3 IMPLANT
TRAY PV CATH (CUSTOM PROCEDURE TRAY) ×3 IMPLANT
WIRE BENTSON .035X145CM (WIRE) ×3 IMPLANT
WIRE G V18X300CM (WIRE) ×18 IMPLANT
WIRE HI TORQ VERSACORE J 260CM (WIRE) ×3 IMPLANT
WIRE ROSEN-J .035X260CM (WIRE) ×3 IMPLANT

## 2020-12-11 NOTE — Interval H&P Note (Signed)
History and Physical Interval Note:  12/11/2020 7:54 AM  Christian Dalton  has presented today for surgery, with the diagnosis of pvd foot ulcer.  The various methods of treatment have been discussed with the patient and family. After consideration of risks, benefits and other options for treatment, the patient has consented to  Procedure(s): ABDOMINAL AORTOGRAM W/LOWER EXTREMITY (N/A) as a surgical intervention.  The patient's history has been reviewed, patient examined, no change in status, stable for surgery.  I have reviewed the patient's chart and labs.  Questions were answered to the patient's satisfaction.     [name]

## 2020-12-11 NOTE — Progress Notes (Signed)
Groin checked after ambulation. Level 2 hematoma present, VSS, held pressure 15 minutes. Site now level 0, Scot Dock MD is on call doc, paged. Instructed to hold pt another hour then ambulate again.

## 2020-12-11 NOTE — Discharge Instructions (Signed)
Femoral Site Care This sheet gives you information about how to care for yourself after your procedure. Your health care provider may also give you more specific instructions. If you have problems or questions, contact your health care provider. What can I expect after the procedure?  After the procedure, it is common to have:  Bruising that usually fades within 1-2 weeks.  Tenderness at the site. Follow these instructions at home: Wound care 1. Follow instructions from your health care provider about how to take care of your insertion site. Make sure you: ? Wash your hands with soap and water before you change your bandage (dressing). If soap and water are not available, use hand sanitizer. ? Remove your dressing as told by your health care provider. In 24 hours 2. Do not take baths, swim, or use a hot tub until your health care provider approves. 3. You may shower 24-48 hours after the procedure or as told by your health care provider. ? Gently wash the site with plain soap and water. ? Pat the area dry with a clean towel. ? Do not rub the site. This may cause bleeding. 4. Do not apply powder or lotion to the site. Keep the site clean and dry. 5. Check your femoral site every day for signs of infection. Check for: ? Redness, swelling, or pain. ? Fluid or blood. ? Warmth. ? Pus or a bad smell. Activity 1. For the first 2-3 days after your procedure, or as long as directed: ? Avoid climbing stairs as much as possible. ? Do not squat. 2. Do not lift anything that is heavier than 10 lb (4.5 kg), or the limit that you are told, until your health care provider says that it is safe. For 5 days 3. Rest as directed. ? Avoid sitting for a long time without moving. Get up to take short walks every 1-2 hours. 4. Do not drive for 24 hours if you were given a medicine to help you relax (sedative). General instructions  Take over-the-counter and prescription medicines only as told by your  health care provider.  Keep all follow-up visits as told by your health care provider. This is important. Contact a health care provider if you have:  A fever or chills.  You have redness, swelling, or pain around your insertion site. Get help right away if:  The catheter insertion area swells very fast.  You pass out.  You suddenly start to sweat or your skin gets clammy.  The catheter insertion area is bleeding, and the bleeding does not stop when you hold steady pressure on the area.  The area near or just beyond the catheter insertion site becomes pale, cool, tingly, or numb. These symptoms may represent a serious problem that is an emergency. Do not wait to see if the symptoms will go away. Get medical help right away. Call your local emergency services (911 in the [place] Do not drive yourself to the hospital. Summary  After the procedure, it is common to have bruising that usually fades within 1-2 weeks.  Check your femoral site every day for signs of infection.  Do not lift anything that is heavier than 10 lb (4.5 kg), or the limit that you are told, until your health care provider says that it is safe. This information is not intended to replace advice given to you by your health care provider. Make sure you discuss any questions you have with your health care provider. Document Revised: 11/23/2017 Document Reviewed: 11/23/2017   [employer]vier Patient Education  2020 [employer]   

## 2020-12-11 NOTE — Op Note (Signed)
Patient name: Christian Dalton MRN: [id number] DOB: 10/06/1941 Sex: male  12/11/2020 Pre-operative Diagnosis: Right leg ulcer Post-operative diagnosis:  Same Surgeon:  [name] Procedure Performed:  1. Ultrasound-guided access, left femoral artery  2. Abdominal aortogram  3. Bilateral lower extremity runoff  4. Angioplasty, right peroneal artery  5. Ultrasound-guided access, right dorsalis pedis artery  6. Failed retrograde angioplasty right anterior tibial artery  7. Conscious sedation, 146 minutes  8. Mynx closure device   Indications: This is a 80 year old gentleman with bilateral lower extremity ulcers. The left appears to be healing. The right is on the heel. Ultrasound identified tibial/pedal disease. We discussed coming in for arteriogram for further evaluation and possible intervention  Procedure:  The patient was identified in the holding area and taken to room 8.  The patient was then placed supine on the table and prepped and draped in the usual sterile fashion.  A time out was called.  Conscious sedation was administered with the use of IV fentanyl and Versed under continuous physician and nurse monitoring.  Heart rate, blood pressure, and oxygen saturation were continuously monitored.  Total sedation time was 146 minutes.  Ultrasound was used to evaluate the left common femoral artery.  It was patent .  A digital ultrasound image was acquired.  A micropuncture needle was used to access the left common femoral artery under ultrasound guidance.  An 018 wire was advanced without resistance and a micropuncture sheath was placed.  The 018 wire was removed and a benson wire was placed.  The micropuncture sheath was exchanged for a 5 french sheath.  An omniflush catheter was advanced over the wire to the level of L-1.  An abdominal angiogram was obtained. The catheter was then pulled down to the aortic bifurcation and bilateral runoff was performed. Findings:   Aortogram: No  significant renal artery stenosis was visualized. The infrarenal abdominal aorta is widely patent. Bilateral common and external iliac arteries are patent without stenosis  Right Lower Extremity: The right common femoral, profundofemoral and superficial femoral artery are widely patent. The popliteal artery is patent throughout its course. The anterior tibial artery occludes just beyond its origin and reconstitutes across the ankle. The posterior tibial artery is patent down to the ankle where it occludes. The peroneal artery has a approximate 90% stenosis.  Left Lower Extremity: The left common femoral profundofemoral and superficial femoral artery are widely patent. The popliteal artery is widely patent. There is two-vessel runoff via the peroneal and posterior tibial artery which are heavily calcified. There is limited evaluation of blood flow across the ankle, and is likely associated with significant pedal disease.  Intervention: After the above images were acquired the decision was made to proceed with intervention. The aortic bifurcation was crossed and a 6 French 90 cm sheath was advanced into the right popliteal artery. The patient was fully heparinized. The anterior tibial artery was then selected with a V-18 wire and a support of a 3 mm balloon. Multiple attempts at crossing the lesion were performed however had difficulty getting a wire or catheter past the mid calf. Ultimately I did not think this was going to be possible and so I turned my attention towards the peroneal artery. The V 18 was advanced across the stenosis and balloon angioplasty using a 3 mm balloon was performed to the peroneal artery for 2 minutes. Completion imaging revealed resolution of the stenosis.  At this point in time I attempted to address the anterior tibial artery from  a retrograde pedal approach. The foot was prepped. The dorsalis pedis artery was then cannulated under ultrasound guidance with a micropuncture needle. A  018 wire was advanced followed by placement of a 4 French sheath. I then used a V-18 wire and a CSI angle catheter. I was able to get to the origin anterior tibial artery however it was never able to successfully gain reentry. Ultimately elected to abort the procedure. The groin was closed with a minx without complication.  Impression:  #1 Successful balloon angioplasty of a 90% peroneal artery stenosis with a 3 mm balloon with no residual stenosis.  #2 Failed attempt at recanalization of an occluded anterior tibial artery both from antegrade as well as a pedal approach on the right. If the patient has difficulty with wound healing, a second attempt could be performed in the future.  #3 Two-vessel runoff on the left via the peroneal and posterior tibial artery. Limited visualization of contrast onto the foot     V. [name], M.D., [facility] [place] Office: [phone] Pager:  [phone]

## 2020-12-11 NOTE — Progress Notes (Signed)
Ambulated pt again after 1 hour of bedrest. Site remained level 0 but puffy, Scot Dock MD to bedside, approved pt for discharge. Instructions given to pt and pt's wife.

## 2020-12-13 DIAGNOSIS — Z992 Dependence on renal dialysis: Secondary | ICD-10-CM | POA: Diagnosis not present

## 2020-12-13 DIAGNOSIS — N2581 Secondary hyperparathyroidism of renal origin: Secondary | ICD-10-CM | POA: Diagnosis not present

## 2020-12-13 DIAGNOSIS — N186 End stage renal disease: Secondary | ICD-10-CM | POA: Diagnosis not present

## 2020-12-13 DIAGNOSIS — D631 Anemia in chronic kidney disease: Secondary | ICD-10-CM | POA: Diagnosis not present

## 2020-12-15 DIAGNOSIS — N400 Enlarged prostate without lower urinary tract symptoms: Secondary | ICD-10-CM | POA: Diagnosis not present

## 2020-12-15 DIAGNOSIS — Z992 Dependence on renal dialysis: Secondary | ICD-10-CM | POA: Diagnosis not present

## 2020-12-15 DIAGNOSIS — K862 Cyst of pancreas: Secondary | ICD-10-CM | POA: Diagnosis not present

## 2020-12-15 DIAGNOSIS — N186 End stage renal disease: Secondary | ICD-10-CM | POA: Diagnosis not present

## 2020-12-15 DIAGNOSIS — I120 Hypertensive chronic kidney disease with stage 5 chronic kidney disease or end stage renal disease: Secondary | ICD-10-CM | POA: Diagnosis not present

## 2020-12-15 DIAGNOSIS — I97621 Postprocedural hematoma of a circulatory system organ or structure following other procedure: Secondary | ICD-10-CM | POA: Diagnosis not present

## 2020-12-15 DIAGNOSIS — E1122 Type 2 diabetes mellitus with diabetic chronic kidney disease: Secondary | ICD-10-CM | POA: Diagnosis not present

## 2020-12-15 DIAGNOSIS — K4020 Bilateral inguinal hernia, without obstruction or gangrene, not specified as recurrent: Secondary | ICD-10-CM | POA: Diagnosis not present

## 2020-12-15 DIAGNOSIS — L7632 Postprocedural hematoma of skin and subcutaneous tissue following other procedure: Secondary | ICD-10-CM | POA: Diagnosis not present

## 2020-12-17 ENCOUNTER — Telehealth: Payer: Self-pay

## 2020-12-17 DIAGNOSIS — D631 Anemia in chronic kidney disease: Secondary | ICD-10-CM | POA: Diagnosis not present

## 2020-12-17 DIAGNOSIS — N186 End stage renal disease: Secondary | ICD-10-CM | POA: Diagnosis not present

## 2020-12-17 DIAGNOSIS — N2581 Secondary hyperparathyroidism of renal origin: Secondary | ICD-10-CM | POA: Diagnosis not present

## 2020-12-17 DIAGNOSIS — Z992 Dependence on renal dialysis: Secondary | ICD-10-CM | POA: Diagnosis not present

## 2020-12-17 NOTE — Telephone Encounter (Signed)
Patient's wife called to report patient taken off Plavix due to rectal bleeding and ask if he should take something else. He is s/p aortogram & peroneal angioplasty this month. Discussed with MD, patient may continue to take ASA. He will keep February follow up appt.

## 2020-12-20 DIAGNOSIS — D631 Anemia in chronic kidney disease: Secondary | ICD-10-CM | POA: Diagnosis not present

## 2020-12-20 DIAGNOSIS — N2581 Secondary hyperparathyroidism of renal origin: Secondary | ICD-10-CM | POA: Diagnosis not present

## 2020-12-20 DIAGNOSIS — N186 End stage renal disease: Secondary | ICD-10-CM | POA: Diagnosis not present

## 2020-12-20 DIAGNOSIS — Z992 Dependence on renal dialysis: Secondary | ICD-10-CM | POA: Diagnosis not present

## 2020-12-22 DIAGNOSIS — N186 End stage renal disease: Secondary | ICD-10-CM | POA: Diagnosis not present

## 2020-12-22 DIAGNOSIS — Z992 Dependence on renal dialysis: Secondary | ICD-10-CM | POA: Diagnosis not present

## 2020-12-22 DIAGNOSIS — N2581 Secondary hyperparathyroidism of renal origin: Secondary | ICD-10-CM | POA: Diagnosis not present

## 2020-12-22 DIAGNOSIS — D631 Anemia in chronic kidney disease: Secondary | ICD-10-CM | POA: Diagnosis not present

## 2020-12-25 DIAGNOSIS — E1122 Type 2 diabetes mellitus with diabetic chronic kidney disease: Secondary | ICD-10-CM | POA: Diagnosis not present

## 2020-12-25 DIAGNOSIS — D631 Anemia in chronic kidney disease: Secondary | ICD-10-CM | POA: Diagnosis not present

## 2020-12-25 DIAGNOSIS — N2581 Secondary hyperparathyroidism of renal origin: Secondary | ICD-10-CM | POA: Diagnosis not present

## 2020-12-25 DIAGNOSIS — E1151 Type 2 diabetes mellitus with diabetic peripheral angiopathy without gangrene: Secondary | ICD-10-CM | POA: Diagnosis not present

## 2020-12-25 DIAGNOSIS — L03032 Cellulitis of left toe: Secondary | ICD-10-CM | POA: Diagnosis not present

## 2020-12-25 DIAGNOSIS — N186 End stage renal disease: Secondary | ICD-10-CM | POA: Diagnosis not present

## 2020-12-25 DIAGNOSIS — S90821D Blister (nonthermal), right foot, subsequent encounter: Secondary | ICD-10-CM | POA: Diagnosis not present

## 2020-12-25 DIAGNOSIS — Z992 Dependence on renal dialysis: Secondary | ICD-10-CM | POA: Diagnosis not present

## 2020-12-27 DIAGNOSIS — E1151 Type 2 diabetes mellitus with diabetic peripheral angiopathy without gangrene: Secondary | ICD-10-CM | POA: Diagnosis not present

## 2020-12-27 DIAGNOSIS — D631 Anemia in chronic kidney disease: Secondary | ICD-10-CM | POA: Diagnosis not present

## 2020-12-27 DIAGNOSIS — N2581 Secondary hyperparathyroidism of renal origin: Secondary | ICD-10-CM | POA: Diagnosis not present

## 2020-12-27 DIAGNOSIS — L03032 Cellulitis of left toe: Secondary | ICD-10-CM | POA: Diagnosis not present

## 2020-12-27 DIAGNOSIS — N186 End stage renal disease: Secondary | ICD-10-CM | POA: Diagnosis not present

## 2020-12-27 DIAGNOSIS — Z992 Dependence on renal dialysis: Secondary | ICD-10-CM | POA: Diagnosis not present

## 2020-12-27 DIAGNOSIS — S90821D Blister (nonthermal), right foot, subsequent encounter: Secondary | ICD-10-CM | POA: Diagnosis not present

## 2020-12-29 DIAGNOSIS — D631 Anemia in chronic kidney disease: Secondary | ICD-10-CM | POA: Diagnosis not present

## 2020-12-29 DIAGNOSIS — N186 End stage renal disease: Secondary | ICD-10-CM | POA: Diagnosis not present

## 2020-12-29 DIAGNOSIS — N2581 Secondary hyperparathyroidism of renal origin: Secondary | ICD-10-CM | POA: Diagnosis not present

## 2020-12-29 DIAGNOSIS — Z992 Dependence on renal dialysis: Secondary | ICD-10-CM | POA: Diagnosis not present

## 2021-01-01 DIAGNOSIS — N2581 Secondary hyperparathyroidism of renal origin: Secondary | ICD-10-CM | POA: Diagnosis not present

## 2021-01-01 DIAGNOSIS — D631 Anemia in chronic kidney disease: Secondary | ICD-10-CM | POA: Diagnosis not present

## 2021-01-01 DIAGNOSIS — Z992 Dependence on renal dialysis: Secondary | ICD-10-CM | POA: Diagnosis not present

## 2021-01-01 DIAGNOSIS — N186 End stage renal disease: Secondary | ICD-10-CM | POA: Diagnosis not present

## 2021-01-02 DIAGNOSIS — I447 Left bundle-branch block, unspecified: Secondary | ICD-10-CM | POA: Diagnosis not present

## 2021-01-02 DIAGNOSIS — I2510 Atherosclerotic heart disease of native coronary artery without angina pectoris: Secondary | ICD-10-CM | POA: Diagnosis not present

## 2021-01-02 DIAGNOSIS — Z992 Dependence on renal dialysis: Secondary | ICD-10-CM | POA: Diagnosis not present

## 2021-01-02 DIAGNOSIS — E782 Mixed hyperlipidemia: Secondary | ICD-10-CM | POA: Diagnosis not present

## 2021-01-02 DIAGNOSIS — I739 Peripheral vascular disease, unspecified: Secondary | ICD-10-CM | POA: Diagnosis not present

## 2021-01-02 DIAGNOSIS — N186 End stage renal disease: Secondary | ICD-10-CM | POA: Diagnosis not present

## 2021-01-03 DIAGNOSIS — D631 Anemia in chronic kidney disease: Secondary | ICD-10-CM | POA: Diagnosis not present

## 2021-01-03 DIAGNOSIS — N186 End stage renal disease: Secondary | ICD-10-CM | POA: Diagnosis not present

## 2021-01-03 DIAGNOSIS — N2581 Secondary hyperparathyroidism of renal origin: Secondary | ICD-10-CM | POA: Diagnosis not present

## 2021-01-03 DIAGNOSIS — Z992 Dependence on renal dialysis: Secondary | ICD-10-CM | POA: Diagnosis not present

## 2021-01-05 DIAGNOSIS — D631 Anemia in chronic kidney disease: Secondary | ICD-10-CM | POA: Diagnosis not present

## 2021-01-05 DIAGNOSIS — N2581 Secondary hyperparathyroidism of renal origin: Secondary | ICD-10-CM | POA: Diagnosis not present

## 2021-01-05 DIAGNOSIS — N186 End stage renal disease: Secondary | ICD-10-CM | POA: Diagnosis not present

## 2021-01-05 DIAGNOSIS — Z992 Dependence on renal dialysis: Secondary | ICD-10-CM | POA: Diagnosis not present

## 2021-01-07 ENCOUNTER — Ambulatory Visit (INDEPENDENT_AMBULATORY_CARE_PROVIDER_SITE_OTHER): Payer: Medicare Other | Admitting: Surgery

## 2021-01-07 ENCOUNTER — Other Ambulatory Visit: Payer: Self-pay

## 2021-01-07 ENCOUNTER — Encounter: Payer: Self-pay | Admitting: Surgery

## 2021-01-07 VITALS — BP 130/74 | HR 83 | Temp 97.9°F | Resp 20 | Ht 70.0 in | Wt 204.0 lb

## 2021-01-07 DIAGNOSIS — I70234 Atherosclerosis of native arteries of right leg with ulceration of heel and midfoot: Secondary | ICD-10-CM | POA: Diagnosis not present

## 2021-01-07 NOTE — Progress Notes (Signed)
[facility] of [facility]  Patient name: Christian Dalton MRN: [id number] DOB: 10-Jan-1941 Sex: male   REASON FOR VISIT:    Follow up  HISOTRY OF PRESENT ILLNESS:    Christian Dalton is a 80 y.o. male, who is referred for evaluation of peripheral vascular disease in the lower extremities.  He has undergone left great toenail removal which has been healing.  He also has developed a blister on the right heel and dorsum of the foot.  He is referred for this reason.  On 12/11/2020 he underwent balloon angioplasty of a 90% right peroneal artery stenosis.  I tried to recanalize his anterior tibial artery from antegrade as well as a pedal approach, however this was unsuccessful.  He is back today for a wound check.  His wounds have healed.  He states that his right leg feels much better.  His left leg is now bothering him.  He has issues with his left great toenail.  The patient suffers from end-stage renal disease on dialysis secondary to diabetes and hypertension.  He is a non-smoker.  He has COPD.  He has a history of coronary artery disease.  He had a mild nonobstructive pattern on cardiac catheterization in 2016.  He takes a statin for hypercholesterolemia.   PAST MEDICAL HISTORY:   Past Medical History:  Diagnosis Date  . Acute cholecystitis 08/17/2018  . Anemia   . Arthritis   . Atrial fibrillation ([place])   . CAD (coronary artery disease)   . Cholecystitis   . COPD (chronic obstructive pulmonary disease) ([place])   . Depression   . Diabetes mellitus without complication ([place])   . ESRD (end stage renal disease) on dialysis ([place])   . HOH (hard of hearing)   . Hypertension   . Hypothyroidism   . OSA (obstructive sleep apnea)    does not wear cpap  . Renal disorder   . Spinal stenosis   . Wears dentures    wears top dentures  . Wears glasses   . Wears hearing aid in both ears      FAMILY HISTORY:   Family History  Problem Relation  Age of Onset  . Heart disease Mother   . Heart disease Father   . Heart disease Sister     SOCIAL HISTORY:   Social History   Tobacco Use  . Smoking status: Never Smoker  . Smokeless tobacco: Former Systems developer    Types: Chew  Substance Use Topics  . Alcohol use: Not Currently     ALLERGIES:   Allergies  Allergen Reactions  . Avelox [Moxifloxacin Hcl] Other (See Comments)    Hallucinations   . Gabapentin Other (See Comments)    Caused staggering and red feet  . Metoprolol Other (See Comments)    Dropped pulse too low; was taken off of this by MD  . Shellfish Allergy Nausea And Vomiting and Other (See Comments)    Made patient VERY nauseous and he developed stomach cramps  . Sulfa Antibiotics Other (See Comments)    Allergy is from childhood (reaction not recalled)  . Tetracyclines & Related Other (See Comments)    Blisters on hands and arms  . Adhesive [Tape] Itching and Rash    Paper tape only- skin cannot tolerate "heavy" tapes     CURRENT MEDICATIONS:   Current Outpatient Medications  Medication Sig Dispense Refill  . aspirin EC 81 MG tablet Take 81 mg by mouth at bedtime.     Marland Kitchen atorvastatin (LIPITOR)  10 MG tablet Take 10 mg by mouth at bedtime.    Marland Kitchen buPROPion (WELLBUTRIN XL) 150 MG 24 hr tablet Take 150 mg by mouth every evening.     . docusate sodium (COLACE) 100 MG capsule Take 100 mg by mouth 2 (two) times daily.     Marland Kitchen escitalopram (LEXAPRO) 10 MG tablet Take 10 mg by mouth at bedtime.     . fluticasone (FLONASE) 50 MCG/ACT nasal spray Place 1 spray into both nostrils daily as needed for allergies or rhinitis.    Marland Kitchen lactulose, encephalopathy, (CHRONULAC) 10 GM/15ML SOLN Take 10 g by mouth daily as needed (constipation).  4  . levothyroxine (SYNTHROID, LEVOTHROID) 125 MCG tablet Take 125 mcg by mouth daily.     Marland Kitchen lidocaine-prilocaine (EMLA) cream Apply 1 application topically daily as needed (port access).   4  . linaclotide (LINZESS) 290 MCG CAPS capsule Take 290  mcg by mouth every evening.     . loratadine (CLARITIN) 10 MG tablet Take 10 mg by mouth daily.     . montelukast (SINGULAIR) 10 MG tablet Take 10 mg every evening by mouth.    . nitroGLYCERIN (NITROSTAT) 0.4 MG SL tablet Place 0.4 mg under the tongue every 5 (five) minutes as needed for chest pain.    Marland Kitchen oxyCODONE-acetaminophen (PERCOCET) 10-325 MG tablet Take 1 tablet by mouth every 4 (four) hours as needed for pain.    . pantoprazole (PROTONIX) 40 MG tablet Take 1 tablet (40 mg total) by mouth 2 (two) times daily. 60 tablet 2  . Polyethyl Glycol-Propyl Glycol (SYSTANE OP) Place 1 drop into both eyes daily as needed (dry eyes).    . polyethylene glycol (MIRALAX / GLYCOLAX) 17 g packet Take 17 g by mouth daily as needed for moderate constipation.    Marland Kitchen PROAIR HFA 108 (90 Base) MCG/ACT inhaler Inhale 2 puffs into the lungs every 6 (six) hours as needed for shortness of breath.    . sevelamer carbonate (RENVELA) 800 MG tablet Take 3 tablets (2,400 mg total) by mouth 3 (three) times daily with meals. (Patient taking differently: Take 2,400 mg by mouth 2 (two) times daily with a meal.) 45 tablet 0   Current Facility-Administered Medications  Medication Dose Route Frequency Provider Last Rate Last Admin  . 0.9 %  sodium chloride infusion  250 mL Intravenous PRN Serafina Mitchell, MD      . sodium chloride flush (NS) 0.9 % injection 3 mL  3 mL Intravenous Q12H Serafina Mitchell, MD      . sodium chloride flush (NS) 0.9 % injection 3 mL  3 mL Intravenous PRN Serafina Mitchell, MD        REVIEW OF SYSTEMS:   X denotes positive finding, denotes negative finding Cardiac  Comments:  Chest pain or chest pressure:    Shortness of breath upon exertion:    Short of breath when lying flat:    Irregular heart rhythm:        Vascular    Pain in calf, thigh, or hip brought on by ambulation:    Pain in feet at night that wakes you up from your sleep:     Blood clot in your veins:    Leg swelling:          Pulmonary    Oxygen at home:    Productive cough:     Wheezing:         Neurologic    Sudden weakness in arms or legs:  Sudden numbness in arms or legs:     Sudden onset of difficulty speaking or slurred speech:    Temporary loss of vision in one eye:     Problems with dizziness:         Gastrointestinal    Blood in stool:     Vomited blood:         Genitourinary    Burning when urinating:     Blood in urine:        Psychiatric    Major depression:         Hematologic    Bleeding problems:    Problems with blood clotting too easily:        Skin    Rashes or ulcers:        Constitutional    Fever or chills:      PHYSICAL EXAM:   Vitals:   01/07/21 0831  BP: 130/74  Pulse: 83  Resp: 20  Temp: 97.9 F (36.6 C)  SpO2: 95%  Weight: 204 lb (92.5 kg)  Height: 5\' 10"  (1.778 m)    GENERAL: The patient is a well-nourished male, in no acute distress. The vital signs are documented above. CARDIAC: There is a regular rate and rhythm.  VASCULAR: Nonpalpable pedal pulses PULMONARY: Non-labored respirations ABDOMEN: Soft and non-tender with normal pitched bowel sounds.  MUSCULOSKELETAL: There are no major deformities or cyanosis. NEUROLOGIC: No focal weakness or paresthesias are detected. SKIN: There are no ulcers or rashes noted. PSYCHIATRIC: The patient has a normal affect.  STUDIES:   None  MEDICAL ISSUES:   Right leg: The patient states that his leg feels much better.  His wounds have healed. Left leg: The left leg does not have any sensation on it.  He has a eschar on the left great toe.  He desperately wants to get the left leg worked on because of how much better his right leg now feels.  I think it is reasonable to proceed.  This will most likely be a posterior tibial intervention via a right femoral approach.  I have scheduled it for Tuesday, February 22.  We will rearrange his dialysis.  The patient did stop his Plavix because of hemorrhoidal  bleeding.  He is scheduled to have his hemorrhoids addressed this week so hopefully we can put him back on Plavix    [name], IV, MD, FACS [facility] [phone] Pager [phone]

## 2021-01-07 NOTE — H&P (View-Only) (Signed)
[facility] and [facility]in Specialist of [facility]  Patient name: Christian Dalton MRN: [id number] DOB: 07-02-1941 Sex: male   REASON FOR VISIT:    Follow up  HISOTRY OF PRESENT ILLNESS:    Christian Dalton is a 80 y.o. male, who is referred for evaluation of peripheral vascular disease in the lower extremities.  He has undergone left great toenail removal which has been healing.  He also has developed a blister on the right heel and dorsum of the foot.  He is referred for this reason.  On 12/11/2020 he underwent balloon angioplasty of a 90% right peroneal artery stenosis.  I tried to recanalize his anterior tibial artery from antegrade as well as a pedal approach, however this was unsuccessful.  He is back today for a wound check.  His wounds have healed.  He states that his right leg feels much better.  His left leg is now bothering him.  He has issues with his left great toenail.  The patient suffers from end-stage renal disease on dialysis secondary to diabetes and hypertension.  He is a non-smoker.  He has COPD.  He has a history of coronary artery disease.  He had a mild nonobstructive pattern on cardiac catheterization in 2016.  He takes a statin for hypercholesterolemia.   PAST MEDICAL HISTORY:   Past Medical History:  Diagnosis Date  . Acute cholecystitis 08/17/2018  . Anemia   . Arthritis   . Atrial fibrillation ([place])   . CAD (coronary artery disease)   . Cholecystitis   . COPD (chronic obstructive pulmonary disease) ([place])   . Depression   . Diabetes mellitus without complication ([place])   . ESRD (end stage renal disease) on dialysis ([place])   . HOH (hard of hearing)   . Hypertension   . Hypothyroidism   . OSA (obstructive sleep apnea)    does not wear cpap  . Renal disorder   . Spinal stenosis   . Wears dentures    wears top dentures  . Wears glasses   . Wears hearing aid in both ears      FAMILY HISTORY:   Family History  Problem Relation  Age of Onset  . Heart disease Mother   . Heart disease Father   . Heart disease Sister     SOCIAL HISTORY:   Social History   Tobacco Use  . Smoking status: Never Smoker  . Smokeless tobacco: Former Systems developer    Types: Chew  Substance Use Topics  . Alcohol use: Not Currently     ALLERGIES:   Allergies  Allergen Reactions  . Avelox [Moxifloxacin Hcl] Other (See Comments)    Hallucinations   . Gabapentin Other (See Comments)    Caused staggering and red feet  . Metoprolol Other (See Comments)    Dropped pulse too low; was taken off of this by MD  . Shellfish Allergy Nausea And Vomiting and Other (See Comments)    Made patient VERY nauseous and he developed stomach cramps  . Sulfa Antibiotics Other (See Comments)    Allergy is from childhood (reaction not recalled)  . Tetracyclines & Related Other (See Comments)    Blisters on hands and arms  . Adhesive [Tape] Itching and Rash    Paper tape only- skin cannot tolerate "heavy" tapes     CURRENT MEDICATIONS:   Current Outpatient Medications  Medication Sig Dispense Refill  . aspirin EC 81 MG tablet Take 81 mg by mouth at bedtime.     Marland Kitchen atorvastatin (LIPITOR)  10 MG tablet Take 10 mg by mouth at bedtime.    Marland Kitchen buPROPion (WELLBUTRIN XL) 150 MG 24 hr tablet Take 150 mg by mouth every evening.     . docusate sodium (COLACE) 100 MG capsule Take 100 mg by mouth 2 (two) times daily.     Marland Kitchen escitalopram (LEXAPRO) 10 MG tablet Take 10 mg by mouth at bedtime.     . fluticasone (FLONASE) 50 MCG/ACT nasal spray Place 1 spray into both nostrils daily as needed for allergies or rhinitis.    Marland Kitchen lactulose, encephalopathy, (CHRONULAC) 10 GM/15ML SOLN Take 10 g by mouth daily as needed (constipation).  4  . levothyroxine (SYNTHROID, LEVOTHROID) 125 MCG tablet Take 125 mcg by mouth daily.     Marland Kitchen lidocaine-prilocaine (EMLA) cream Apply 1 application topically daily as needed (port access).   4  . linaclotide (LINZESS) 290 MCG CAPS capsule Take 290  mcg by mouth every evening.     . loratadine (CLARITIN) 10 MG tablet Take 10 mg by mouth daily.     . montelukast (SINGULAIR) 10 MG tablet Take 10 mg every evening by mouth.    . nitroGLYCERIN (NITROSTAT) 0.4 MG SL tablet Place 0.4 mg under the tongue every 5 (five) minutes as needed for chest pain.    Marland Kitchen oxyCODONE-acetaminophen (PERCOCET) 10-325 MG tablet Take 1 tablet by mouth every 4 (four) hours as needed for pain.    . pantoprazole (PROTONIX) 40 MG tablet Take 1 tablet (40 mg total) by mouth 2 (two) times daily. 60 tablet 2  . Polyethyl Glycol-Propyl Glycol (SYSTANE OP) Place 1 drop into both eyes daily as needed (dry eyes).    . polyethylene glycol (MIRALAX / GLYCOLAX) 17 g packet Take 17 g by mouth daily as needed for moderate constipation.    Marland Kitchen PROAIR HFA 108 (90 Base) MCG/ACT inhaler Inhale 2 puffs into the lungs every 6 (six) hours as needed for shortness of breath.    . sevelamer carbonate (RENVELA) 800 MG tablet Take 3 tablets (2,400 mg total) by mouth 3 (three) times daily with meals. (Patient taking differently: Take 2,400 mg by mouth 2 (two) times daily with a meal.) 45 tablet 0   Current Facility-Administered Medications  Medication Dose Route Frequency Provider Last Rate Last Admin  . 0.9 %  sodium chloride infusion  250 mL Intravenous PRN Serafina Mitchell, MD      . sodium chloride flush (NS) 0.9 % injection 3 mL  3 mL Intravenous Q12H Serafina Mitchell, MD      . sodium chloride flush (NS) 0.9 % injection 3 mL  3 mL Intravenous PRN Serafina Mitchell, MD        REVIEW OF SYSTEMS:   X denotes positive finding, denotes negative finding Cardiac  Comments:  Chest pain or chest pressure:    Shortness of breath upon exertion:    Short of breath when lying flat:    Irregular heart rhythm:        Vascular    Pain in calf, thigh, or hip brought on by ambulation:    Pain in feet at night that wakes you up from your sleep:     Blood clot in your veins:    Leg swelling:          Pulmonary    Oxygen at home:    Productive cough:     Wheezing:         Neurologic    Sudden weakness in arms or legs:  Sudden numbness in arms or legs:     Sudden onset of difficulty speaking or slurred speech:    Temporary loss of vision in one eye:     Problems with dizziness:         Gastrointestinal    Blood in stool:     Vomited blood:         Genitourinary    Burning when urinating:     Blood in urine:        Psychiatric    Major depression:         Hematologic    Bleeding problems:    Problems with blood clotting too easily:        Skin    Rashes or ulcers:        Constitutional    Fever or chills:      PHYSICAL EXAM:   Vitals:   01/07/21 0831  BP: 130/74  Pulse: 83  Resp: 20  Temp: 97.9 F (36.6 C)  SpO2: 95%  Weight: 204 lb (92.5 kg)  Height: 5\' 10"  (1.778 m)    GENERAL: The patient is a well-nourished male, in no acute distress. The vital signs are documented above. CARDIAC: There is a regular rate and rhythm.  VASCULAR: Nonpalpable pedal pulses PULMONARY: Non-labored respirations ABDOMEN: Soft and non-tender with normal pitched bowel sounds.  MUSCULOSKELETAL: There are no major deformities or cyanosis. NEUROLOGIC: No focal weakness or paresthesias are detected. SKIN: There are no ulcers or rashes noted. PSYCHIATRIC: The patient has a normal affect.  STUDIES:   None  MEDICAL ISSUES:   Right leg: The patient states that his leg feels much better.  His wounds have healed. Left leg: The left leg does not have any sensation on it.  He has a eschar on the left great toe.  He desperately wants to get the left leg worked on because of how much better his right leg now feels.  I think it is reasonable to proceed.  This will most likely be a posterior tibial intervention via a right femoral approach.  I have scheduled it for Tuesday, February 22.  We will rearrange his dialysis.  The patient did stop his Plavix because of hemorrhoidal  bleeding.  He is scheduled to have his hemorrhoids addressed this week so hopefully we can put him back on Plavix    [name], IV, MD, FACS [facility] [phone] Pager [phone]

## 2021-01-08 DIAGNOSIS — D631 Anemia in chronic kidney disease: Secondary | ICD-10-CM | POA: Diagnosis not present

## 2021-01-08 DIAGNOSIS — N186 End stage renal disease: Secondary | ICD-10-CM | POA: Diagnosis not present

## 2021-01-08 DIAGNOSIS — N2581 Secondary hyperparathyroidism of renal origin: Secondary | ICD-10-CM | POA: Diagnosis not present

## 2021-01-08 DIAGNOSIS — Z992 Dependence on renal dialysis: Secondary | ICD-10-CM | POA: Diagnosis not present

## 2021-01-10 DIAGNOSIS — N2581 Secondary hyperparathyroidism of renal origin: Secondary | ICD-10-CM | POA: Diagnosis not present

## 2021-01-10 DIAGNOSIS — D631 Anemia in chronic kidney disease: Secondary | ICD-10-CM | POA: Diagnosis not present

## 2021-01-10 DIAGNOSIS — N186 End stage renal disease: Secondary | ICD-10-CM | POA: Diagnosis not present

## 2021-01-10 DIAGNOSIS — Z992 Dependence on renal dialysis: Secondary | ICD-10-CM | POA: Diagnosis not present

## 2021-01-11 ENCOUNTER — Other Ambulatory Visit (HOSPITAL_COMMUNITY)
Admission: RE | Admit: 2021-01-11 | Discharge: 2021-01-11 | Disposition: A | Discharge status: Home / Self Care | Payer: Medicare Other | Source: Ambulatory Visit | Attending: Surgery | Admitting: Surgery

## 2021-01-11 DIAGNOSIS — Z01812 Encounter for preprocedural laboratory examination: Secondary | ICD-10-CM | POA: Diagnosis not present

## 2021-01-11 DIAGNOSIS — Z20822 Contact with and (suspected) exposure to covid-19: Secondary | ICD-10-CM | POA: Insufficient documentation

## 2021-01-12 DIAGNOSIS — Z992 Dependence on renal dialysis: Secondary | ICD-10-CM | POA: Diagnosis not present

## 2021-01-12 DIAGNOSIS — N2581 Secondary hyperparathyroidism of renal origin: Secondary | ICD-10-CM | POA: Diagnosis not present

## 2021-01-12 DIAGNOSIS — D631 Anemia in chronic kidney disease: Secondary | ICD-10-CM | POA: Diagnosis not present

## 2021-01-12 DIAGNOSIS — N186 End stage renal disease: Secondary | ICD-10-CM | POA: Diagnosis not present

## 2021-01-12 LAB — SARS CORONAVIRUS 2 (TAT 6-24 HRS): SARS Coronavirus 2: NEGATIVE

## 2021-01-14 DIAGNOSIS — Z992 Dependence on renal dialysis: Secondary | ICD-10-CM | POA: Diagnosis not present

## 2021-01-14 DIAGNOSIS — D631 Anemia in chronic kidney disease: Secondary | ICD-10-CM | POA: Diagnosis not present

## 2021-01-14 DIAGNOSIS — N2581 Secondary hyperparathyroidism of renal origin: Secondary | ICD-10-CM | POA: Diagnosis not present

## 2021-01-14 DIAGNOSIS — N186 End stage renal disease: Secondary | ICD-10-CM | POA: Diagnosis not present

## 2021-01-15 ENCOUNTER — Other Ambulatory Visit: Payer: Self-pay

## 2021-01-15 ENCOUNTER — Ambulatory Visit (HOSPITAL_COMMUNITY)
Admission: RE | Admit: 2021-01-15 | Discharge: 2021-01-15 | Disposition: A | Discharge status: Home / Self Care | Payer: Medicare Other | Attending: Surgery | Admitting: Surgery

## 2021-01-15 ENCOUNTER — Ambulatory Visit (HOSPITAL_COMMUNITY)
Admission: RE | Disposition: A | Discharge status: Home / Self Care | Payer: Self-pay | Source: Home / Self Care | Attending: Surgery

## 2021-01-15 DIAGNOSIS — Z7982 Long term (current) use of aspirin: Secondary | ICD-10-CM | POA: Diagnosis not present

## 2021-01-15 DIAGNOSIS — K649 Unspecified hemorrhoids: Secondary | ICD-10-CM | POA: Insufficient documentation

## 2021-01-15 DIAGNOSIS — E1151 Type 2 diabetes mellitus with diabetic peripheral angiopathy without gangrene: Secondary | ICD-10-CM | POA: Insufficient documentation

## 2021-01-15 DIAGNOSIS — Z882 Allergy status to sulfonamides status: Secondary | ICD-10-CM | POA: Diagnosis not present

## 2021-01-15 DIAGNOSIS — I70222 Atherosclerosis of native arteries of extremities with rest pain, left leg: Secondary | ICD-10-CM | POA: Diagnosis not present

## 2021-01-15 DIAGNOSIS — Z992 Dependence on renal dialysis: Secondary | ICD-10-CM | POA: Insufficient documentation

## 2021-01-15 DIAGNOSIS — Z888 Allergy status to other drugs, medicaments and biological substances status: Secondary | ICD-10-CM | POA: Diagnosis not present

## 2021-01-15 DIAGNOSIS — I120 Hypertensive chronic kidney disease with stage 5 chronic kidney disease or end stage renal disease: Secondary | ICD-10-CM | POA: Diagnosis not present

## 2021-01-15 DIAGNOSIS — E1122 Type 2 diabetes mellitus with diabetic chronic kidney disease: Secondary | ICD-10-CM | POA: Diagnosis not present

## 2021-01-15 DIAGNOSIS — Z79890 Hormone replacement therapy (postmenopausal): Secondary | ICD-10-CM | POA: Insufficient documentation

## 2021-01-15 DIAGNOSIS — Z91013 Allergy to seafood: Secondary | ICD-10-CM | POA: Diagnosis not present

## 2021-01-15 DIAGNOSIS — N186 End stage renal disease: Secondary | ICD-10-CM | POA: Diagnosis not present

## 2021-01-15 DIAGNOSIS — Z79899 Other long term (current) drug therapy: Secondary | ICD-10-CM | POA: Diagnosis not present

## 2021-01-15 HISTORY — PX: ABDOMINAL AORTOGRAM: CATH118222

## 2021-01-15 HISTORY — PX: LOWER EXTREMITY ANGIOGRAPHY: CATH118251

## 2021-01-15 HISTORY — PX: PERIPHERAL VASCULAR BALLOON ANGIOPLASTY: CATH118281

## 2021-01-15 LAB — GLUCOSE, CAPILLARY: Glucose-Capillary: 147 mg/dL — ABNORMAL HIGH (ref 70–99)

## 2021-01-15 LAB — POCT ACTIVATED CLOTTING TIME: Activated Clotting Time: 261 seconds

## 2021-01-15 LAB — POCT I-STAT, CHEM 8
BUN: 41 mg/dL — ABNORMAL HIGH (ref 8–23)
Calcium, Ion: 1.18 mmol/L (ref 1.15–1.40)
Chloride: 94 mmol/L — ABNORMAL LOW (ref 98–111)
Creatinine, Ser: 7.7 mg/dL — ABNORMAL HIGH (ref 0.61–1.24)
Glucose, Bld: 141 mg/dL — ABNORMAL HIGH (ref 70–99)
HCT: 39 % (ref 39.0–52.0)
Hemoglobin: 13.3 g/dL (ref 13.0–17.0)
Potassium: 4.3 mmol/L (ref 3.5–5.1)
Sodium: 135 mmol/L (ref 135–145)
TCO2: 30 mmol/L (ref 22–32)

## 2021-01-15 SURGERY — LOWER EXTREMITY ANGIOGRAPHY
Anesthesia: LOCAL

## 2021-01-15 MED ORDER — FENTANYL CITRATE (PF) 100 MCG/2ML IJ SOLN
INTRAMUSCULAR | Status: DC | PRN
  Administered 2021-01-15 (×2): 25 ug via INTRAVENOUS
  Administered 2021-01-15: 50 ug via INTRAVENOUS

## 2021-01-15 MED ORDER — HYDRALAZINE HCL 20 MG/ML IJ SOLN: 5.0000 mg | INTRAMUSCULAR | Status: DC | PRN

## 2021-01-15 MED ORDER — MIDAZOLAM HCL 2 MG/2ML IJ SOLN
INTRAMUSCULAR | Status: DC | PRN
  Administered 2021-01-15 (×4): 1 mg via INTRAVENOUS

## 2021-01-15 MED ORDER — HYDROMORPHONE HCL 1 MG/ML IJ SOLN: 0.5000 mg | INTRAMUSCULAR | Status: DC | PRN

## 2021-01-15 MED ORDER — SODIUM CHLORIDE 0.9 % IV SOLN: 250.0000 mL | INTRAVENOUS | Status: DC | PRN

## 2021-01-15 MED ORDER — SODIUM CHLORIDE 0.9% FLUSH: 3.0000 mL | INTRAVENOUS | Status: DC | PRN

## 2021-01-15 MED ORDER — ONDANSETRON HCL 4 MG/2ML IJ SOLN: 4.0000 mg | Freq: Four times a day (QID) | INTRAMUSCULAR | Status: DC | PRN

## 2021-01-15 MED ORDER — MIDAZOLAM HCL 5 MG/5ML IJ SOLN
INTRAMUSCULAR | Status: AC
  Filled 2021-01-15: qty 5

## 2021-01-15 MED ORDER — SODIUM CHLORIDE 0.9% FLUSH: 3.0000 mL | Freq: Two times a day (BID) | INTRAVENOUS | Status: DC

## 2021-01-15 MED ORDER — HEPARIN (PORCINE) IN NACL 1000-0.9 UT/500ML-% IV SOLN
INTRAVENOUS | Status: AC
  Filled 2021-01-15: qty 1000

## 2021-01-15 MED ORDER — FENTANYL CITRATE (PF) 100 MCG/2ML IJ SOLN
INTRAMUSCULAR | Status: AC
  Filled 2021-01-15: qty 2

## 2021-01-15 MED ORDER — LIDOCAINE HCL (PF) 1 % IJ SOLN
INTRAMUSCULAR | Status: AC
  Filled 2021-01-15: qty 30

## 2021-01-15 MED ORDER — HEPARIN (PORCINE) IN NACL 1000-0.9 UT/500ML-% IV SOLN
INTRAVENOUS | Status: DC | PRN
  Administered 2021-01-15 (×2): 500 mL

## 2021-01-15 MED ORDER — HEPARIN SODIUM (PORCINE) 1000 UNIT/ML IJ SOLN
INTRAMUSCULAR | Status: AC
  Filled 2021-01-15: qty 1

## 2021-01-15 MED ORDER — HEPARIN SODIUM (PORCINE) 1000 UNIT/ML IJ SOLN
INTRAMUSCULAR | Status: DC | PRN
  Administered 2021-01-15: 9000 [IU] via INTRAVENOUS

## 2021-01-15 MED ORDER — IODIXANOL 320 MG/ML IV SOLN
INTRAVENOUS | Status: DC | PRN
  Administered 2021-01-15: 120 mL

## 2021-01-15 SURGICAL SUPPLY — 21 items
BALLN STERLING OTW 3X220X150 (BALLOONS) ×3
BALLOON STERLING OTW 3X220X150 (BALLOONS) ×2 IMPLANT
CATH 0.018 NAVICROSS ANG 135 (CATHETERS) ×3 IMPLANT
CATH OMNI FLUSH 5F 65CM (CATHETERS) ×3 IMPLANT
CATH QUICKCROSS ANG SELECT (CATHETERS) ×3 IMPLANT
CATH SOFT-VU 4F 65 STRAIGHT (CATHETERS) ×2 IMPLANT
CATH SOFT-VU STRAIGHT 4F 65CM (CATHETERS) ×1
CLOSURE MYNX CONTROL 5F (Vascular Products) ×3 IMPLANT
GUIDEWIRE ANGLED .035X150CM (WIRE) ×3 IMPLANT
KIT ENCORE 26 ADVANTAGE (KITS) ×3 IMPLANT
KIT MICROPUNCTURE NIT STIFF (SHEATH) ×3 IMPLANT
KIT PV (KITS) ×3 IMPLANT
SHEATH FLEX ANSEL ANG 5F 45CM (SHEATH) ×3 IMPLANT
SHEATH PINNACLE 5F 10CM (SHEATH) ×3 IMPLANT
SHEATH PROBE COVER 6X72 (BAG) ×3 IMPLANT
SYR MEDRAD MARK 7 150ML (SYRINGE) ×3 IMPLANT
TRANSDUCER W/STOPCOCK (MISCELLANEOUS) ×3 IMPLANT
TRAY PV CATH (CUSTOM PROCEDURE TRAY) ×3 IMPLANT
WIRE G V18X300CM (WIRE) ×6 IMPLANT
WIRE ROSEN-J .035X180CM (WIRE) ×3 IMPLANT
WIRE STARTER BENTSON 035X150 (WIRE) ×3 IMPLANT

## 2021-01-15 NOTE — Progress Notes (Signed)
Dr Trula Slade notified right groin is soft and per Dr Trula Slade client to be on bedrest until 2100; report received from [facility]

## 2021-01-15 NOTE — Discharge Instructions (Signed)
DRINK PLENTY OF FLUIDS OVER THE NEXT 2-3 DAYS. Femoral Site Care  This sheet gives you information about how to care for yourself after your procedure. Your health care provider may also give you more specific instructions. If you have problems or questions, contact your health care provider. What can I expect after the procedure? After the procedure, it is common to have:  Bruising that usually fades within 1-2 weeks.  Tenderness at the site. Follow these instructions at home: Wound care  Follow instructions from your health care provider about how to take care of your insertion site. Make sure you: ? Wash your hands with soap and water before you change your bandage (dressing). If soap and water are not available, use hand sanitizer. ? Change your dressing as told by your health care provider. ? Leave stitches (sutures), skin glue, or adhesive strips in place. These skin closures may need to stay in place for 2 weeks or longer. If adhesive strip edges start to loosen and curl up, you may trim the loose edges. Do not remove adhesive strips completely unless your health care provider tells you to do that.  Do not take baths, swim, or use a hot tub until your health care provider approves.  You may shower 24-48 hours after the procedure or as told by your health care provider. ? Gently wash the site with plain soap and water. ? Pat the area dry with a clean towel. ? Do not rub the site. This may cause bleeding.  Do not apply powder or lotion to the site. Keep the site clean and dry.  Check your femoral site every day for signs of infection. Check for: ? Redness, swelling, or pain. ? Fluid or blood. ? Warmth. ? Pus or a bad smell. Activity  For the first 2-3 days after your procedure, or as long as directed: ? Avoid climbing stairs as much as possible. ? Do not squat.  Do not lift anything that is heavier than 10 lb (4.5 kg), or the limit that you are told, until your health  care provider says that it is safe.  Rest as directed. ? Avoid sitting for a long time without moving. Get up to take short walks every 1-2 hours.  Do not drive for 24 hours if you were given a medicine to help you relax (sedative). General instructions  Take over-the-counter and prescription medicines only as told by your health care provider.  Keep all follow-up visits as told by your health care provider. This is important. Contact a health care provider if you have:  A fever or chills.  You have redness, swelling, or pain around your insertion site. Get help right away if:  The catheter insertion area swells very fast.  You pass out.  You suddenly start to sweat or your skin gets clammy.  The catheter insertion area is bleeding, and the bleeding does not stop when you hold steady pressure on the area.  The area near or just beyond the catheter insertion site becomes pale, cool, tingly, or numb. These symptoms may represent a serious problem that is an emergency. Do not wait to see if the symptoms will go away. Get medical help right away. Call your local emergency services (911 in the [place] Do not drive yourself to the hospital. Summary  After the procedure, it is common to have bruising that usually fades within 1-2 weeks.  Check your femoral site every day for signs of infection.  Do not lift anything   that is heavier than 10 lb (4.5 kg), or the limit that you are told, until your health care provider says that it is safe. This information is not intended to replace advice given to you by your health care provider. Make sure you discuss any questions you have with your health care provider. Document Revised: 07/13/2020 Document Reviewed: 07/13/2020 Elsevier Patient Education  2021 [employer]  

## 2021-01-15 NOTE — Progress Notes (Addendum)
Client refused to lie down any longer advised client that if he gets up early may have bleeding; client sitting on side of bed and used the urinal; client then back to bed; right groin soft, no bleeding or hematoma

## 2021-01-15 NOTE — Interval H&P Note (Signed)
History and Physical Interval Note:  01/15/2021 2:07 PM  Christian Dalton  has presented today for surgery, with the diagnosis of heal ulcer.  The various methods of treatment have been discussed with the patient and family. After consideration of risks, benefits and other options for treatment, the patient has consented to  Procedure(s): ABDOMINAL AORTOGRAM W/LOWER EXTREMITY (N/A) as a surgical intervention.  The patient's history has been reviewed, patient examined, no change in status, stable for surgery.  I have reviewed the patient's chart and labs.  Questions were answered to the patient's satisfaction.     [name]

## 2021-01-15 NOTE — Interval H&P Note (Signed)
History and Physical Interval Note:  01/15/2021 11:14 AM  Christian Dalton  has presented today for surgery, with the diagnosis of heal ulcer.  The various methods of treatment have been discussed with the patient and family. After consideration of risks, benefits and other options for treatment, the patient has consented to  Procedure(s): ABDOMINAL AORTOGRAM W/LOWER EXTREMITY (N/A) as a surgical intervention.  The patient's history has been reviewed, patient examined, no change in status, stable for surgery.  I have reviewed the patient's chart and labs.  Questions were answered to the patient's satisfaction.     [name]

## 2021-01-15 NOTE — Progress Notes (Signed)
Hematoma distal to insertion site. Pressure applied for 15 minutes and site was soft.

## 2021-01-15 NOTE — Op Note (Signed)
Patient name: Christian Dalton MRN: [id number] DOB: [birth year] Sex: male  01/15/2021 Pre-operative Diagnosis: Left leg rest pain Post-operative diagnosis:  Same Surgeon:  [name] Procedure Performed:  1.  Ultrasound-guided access, right femoral artery  2.  Abdominal aortogram  3.  Left leg runoff  4.  Balloon angioplasty, left posterior tibial artery  5.  Conscious sedation, 74 minutes  6.  Closure device, Mynx     Indications: The patient recent underwent tibial intervention on the right leg for nonhealing wound.  He has significant pain in the left leg and experienced excellent relief after right leg intervention and therefore wants to have his left leg dressed.  Procedure:  The patient was identified in the holding area and taken to room 8.  The patient was then placed supine on the table and prepped and draped in the usual sterile fashion.  A time out was called.  Conscious sedation was administered with the use of IV fentanyl and Versed under continuous physician and nurse monitoring.  Heart rate, blood pressure, and oxygen saturation were continuously monitored.  Total sedation time was 74 minutes.  Ultrasound was used to evaluate the right common femoral artery.  It was patent .  A digital ultrasound image was acquired.  A micropuncture needle was used to access the right common femoral artery under ultrasound guidance.  An 018 wire was advanced without resistance and a micropuncture sheath was placed.  The 018 wire was removed and a benson wire was placed.  The micropuncture sheath was exchanged for a 5 french sheath.  An omniflush catheter was advanced over the wire to the level of L-1.  An abdominal angiogram was obtained.  Next, using the omniflush catheter and a benson wire, the aortic bifurcation was crossed and the catheter was placed into theleft external iliac artery and left runoff was obtained.    Findings:   Aortogram: No significant aortic stenosis was identified.   Bilateral common and external iliac arteries are widely patent  Right Lower Extremity: Not evaluated  Left Lower Extremity: The left common femoral profundofemoral and superficial femoral artery are widely patent.  The popliteal artery is widely patent.  There is two-vessel runoff via the posterior tibial and peroneal artery.  There are several high-grade lesions, greater than 80% within the posterior tibial artery.  Intervention: After the above images were acquired the decision made to proceed with intervention.  Over an 035 wire, a 5 French 45 cm sheath was advanced into the left external iliac artery.  The patient was fully heparinized.  I then used a V-18 wire and a angled 018 catheter to gain access into the posterior tibial artery.  I then performed balloon angioplasty of the entire length of the posterior tibial artery from the ankle up to the origin using a 3 x 220 balloon with 2 separate inflations for 2 minutes at a atmospheres.  Completion imaging revealed resolution of the stenosis and a widely patent posterior tibial artery.  At this point, the catheters and wires were removed.  The long sheath was exchanged out for a short 5 French sheath and a minx was used for closure.  There were no immediate complications.  Impression:  #1  Multiple greater than 80% focal lesions within the posterior tibial artery on the left successfully treated using a 3 x 220 balloon with no residual stenosis.  #2  Two-vessel runoff on the left via the posterior tibial and peroneal artery   V. [name], M.D., FACS  [facility] Office: [phone] Pager:  [phone]

## 2021-01-15 NOTE — Progress Notes (Signed)
I came and evaluated the patient's right groin.  There is a small hematoma but this is soft.  There is no active bleeding.  We will continue to hold manual pressure and delay his discharge until 9 PM tonight  Wells Marykathleen Russi

## 2021-01-16 ENCOUNTER — Encounter (HOSPITAL_COMMUNITY): Payer: Self-pay | Admitting: Surgery

## 2021-01-16 MED FILL — Lidocaine HCl Local Preservative Free (PF) Inj 1%: INTRAMUSCULAR | Qty: 30 | Status: AC

## 2021-01-16 MED FILL — Midazolam HCl Inj 5 MG/5ML (Base Equivalent): INTRAMUSCULAR | Qty: 4 | Status: AC

## 2021-01-17 DIAGNOSIS — N2581 Secondary hyperparathyroidism of renal origin: Secondary | ICD-10-CM | POA: Diagnosis not present

## 2021-01-17 DIAGNOSIS — D631 Anemia in chronic kidney disease: Secondary | ICD-10-CM | POA: Diagnosis not present

## 2021-01-17 DIAGNOSIS — N186 End stage renal disease: Secondary | ICD-10-CM | POA: Diagnosis not present

## 2021-01-17 DIAGNOSIS — Z992 Dependence on renal dialysis: Secondary | ICD-10-CM | POA: Diagnosis not present

## 2021-01-19 DIAGNOSIS — D631 Anemia in chronic kidney disease: Secondary | ICD-10-CM | POA: Diagnosis not present

## 2021-01-19 DIAGNOSIS — N186 End stage renal disease: Secondary | ICD-10-CM | POA: Diagnosis not present

## 2021-01-19 DIAGNOSIS — Z992 Dependence on renal dialysis: Secondary | ICD-10-CM | POA: Diagnosis not present

## 2021-01-19 DIAGNOSIS — N2581 Secondary hyperparathyroidism of renal origin: Secondary | ICD-10-CM | POA: Diagnosis not present

## 2021-02-14 ENCOUNTER — Other Ambulatory Visit: Payer: Self-pay | Admitting: *Deleted

## 2021-02-14 DIAGNOSIS — I739 Peripheral vascular disease, unspecified: Secondary | ICD-10-CM

## 2021-02-18 ENCOUNTER — Other Ambulatory Visit: Payer: Self-pay

## 2021-02-18 ENCOUNTER — Ambulatory Visit (HOSPITAL_COMMUNITY)
Admission: RE | Admit: 2021-02-18 | Discharge: 2021-02-18 | Disposition: A | Discharge status: Home / Self Care | Payer: Medicare Other | Source: Ambulatory Visit | Attending: Surgery | Admitting: Surgery

## 2021-02-18 ENCOUNTER — Ambulatory Visit (INDEPENDENT_AMBULATORY_CARE_PROVIDER_SITE_OTHER)
Admission: RE | Admit: 2021-02-18 | Discharge: 2021-02-18 | Disposition: A | Discharge status: Home / Self Care | Payer: Medicare Other | Source: Ambulatory Visit | Attending: Surgery | Admitting: Surgery

## 2021-02-18 ENCOUNTER — Ambulatory Visit (INDEPENDENT_AMBULATORY_CARE_PROVIDER_SITE_OTHER): Payer: Medicare Other | Admitting: Physician Assistant

## 2021-02-18 VITALS — BP 126/70 | HR 83 | Temp 97.9°F | Resp 20 | Ht 70.0 in | Wt 204.8 lb

## 2021-02-18 DIAGNOSIS — I739 Peripheral vascular disease, unspecified: Secondary | ICD-10-CM | POA: Insufficient documentation

## 2021-02-18 DIAGNOSIS — I70234 Atherosclerosis of native arteries of right leg with ulceration of heel and midfoot: Secondary | ICD-10-CM

## 2021-02-18 NOTE — Progress Notes (Signed)
History of Present Illness:  Patient is a 80 y.o. year old male who presents for evaluation of PAD with history of right foot non healing wound.  He has undergone left great toenail removal which has been healing. He also has developed a blister on the right heel and dorsum of the foot. He is referred for this reason.  On 12/11/2020 he underwent balloon angioplasty of a 90% right peroneal artery stenosis.  On 01/15/21 he underwent angiogram with PT angioplasty for 80% stenosis.  He has known ATA occlusion bilaterally.   He denise rest pain, non healing ulcers and symptoms of claudication.    Past Medical History:  Diagnosis Date  . Acute cholecystitis 08/17/2018  . Anemia   . Arthritis   . Atrial fibrillation ([place])   . CAD (coronary artery disease)   . Cholecystitis   . COPD (chronic obstructive pulmonary disease) ([place])   . Depression   . Diabetes mellitus without complication ([place])   . ESRD (end stage renal disease) on dialysis ([place])   . HOH (hard of hearing)   . Hypertension   . Hypothyroidism   . OSA (obstructive sleep apnea)    does not wear cpap  . Renal disorder   . Spinal stenosis   . Wears dentures    wears top dentures  . Wears glasses   . Wears hearing aid in both ears     Past Surgical History:  Procedure Laterality Date  . ABDOMINAL AORTOGRAM N/A 01/15/2021   Procedure: ABDOMINAL AORTOGRAM;  Surgeon: Serafina Mitchell, MD;  Location: [facility];  Service: Cardiovascular;  Laterality: N/A;  . ABDOMINAL AORTOGRAM W/LOWER EXTREMITY Bilateral 12/11/2020   Procedure: ABDOMINAL AORTOGRAM W/LOWER EXTREMITY;  Surgeon: Serafina Mitchell, MD;  Location: [facility];  Service: Cardiovascular;  Laterality: Bilateral;  . BASCILIC VEIN TRANSPOSITION Left 12/02/2019   Procedure: BASCILIC VEIN TRANSPOSITION LEFT FIRST STAGE;  Surgeon: Serafina Mitchell, MD;  Location: [facility];  Service: Vascular;  Laterality: Left;  . BASCILIC VEIN TRANSPOSITION Left 02/10/2020    Procedure: BASCILIC VEIN TRANSPOSITION LEFT SECOND STAGE;  Surgeon: Serafina Mitchell, MD;  Location: [facility];  Service: Vascular;  Laterality: Left;  . BILIARY STENT PLACEMENT  11/25/2018   Procedure: BILIARY STENT PLACEMENT;  Surgeon: [name], MD;  Location: [facility];  Service: Gastroenterology;;  . BIOPSY  12/20/2018   Procedure: BIOPSY;  Surgeon: [name], MD;  Location: [facility];  Service: Gastroenterology;;  . BIOPSY  08/03/2020   Procedure: BIOPSY;  Surgeon: Otis Brace, MD;  Location: [facility];  Service: Gastroenterology;;  EGD and COLON  . CARDIAC CATHETERIZATION    . CHOLECYSTECTOMY N/A 11/14/2018   Procedure: LAPAROSCOPIC CHOLECYSTECTOMY WITH INTRAOPERATIVE CHOLANGIOGRAM;  Surgeon: Coralie Keens, MD;  Location: [facility];  Service: General;  Laterality: N/A;  . COLONOSCOPY W/ BIOPSIES AND POLYPECTOMY    . COLONOSCOPY WITH PROPOFOL N/A 08/03/2020   Procedure: COLONOSCOPY WITH PROPOFOL;  Surgeon: Otis Brace, MD;  Location: [facility];  Service: Gastroenterology;  Laterality: N/A;  . ERCP N/A 11/25/2018   Procedure: ENDOSCOPIC RETROGRADE CHOLANGIOPANCREATOGRAPHY (ERCP);  Surgeon: [name], MD;  Location: [facility];  Service: Gastroenterology;  Laterality: N/A;  . ESOPHAGOGASTRODUODENOSCOPY N/A 12/20/2018   Procedure: ESOPHAGOGASTRODUODENOSCOPY (EGD);  Surgeon: [name], MD;  Location: [facility];  Service: Gastroenterology;  Laterality: N/A;  . ESOPHAGOGASTRODUODENOSCOPY (EGD) WITH PROPOFOL N/A 08/03/2020   Procedure: ESOPHAGOGASTRODUODENOSCOPY (EGD) WITH PROPOFOL;  Surgeon: Otis Brace, MD;  Location: [facility];  Service: Gastroenterology;  Laterality: N/A;  . EYE SURGERY     cataract B/L  . HAND SURGERY Right   . IR DIALY SHUNT INTRO NEEDLE/INTRACATH INITIAL W/IMG LEFT Left 08/20/2018  . IR PERC CHOLECYSTOSTOMY  08/19/2018  . IR RADIOLOGIST EVAL & MGMT  09/29/2018  . LOWER EXTREMITY ANGIOGRAPHY Left 01/15/2021   Procedure: Lower Extremity  Angiography;  Surgeon: Serafina Mitchell, MD;  Location: [facility];  Service: Cardiovascular;  Laterality: Left;  Marland Kitchen MULTIPLE TOOTH EXTRACTIONS    . PERIPHERAL VASCULAR BALLOON ANGIOPLASTY Right 12/11/2020   Procedure: PERIPHERAL VASCULAR BALLOON ANGIOPLASTY;  Surgeon: Serafina Mitchell, MD;  Location: [facility];  Service: Cardiovascular;  Laterality: Right;  . PERIPHERAL VASCULAR BALLOON ANGIOPLASTY Left 01/15/2021   Procedure: PERIPHERAL VASCULAR BALLOON ANGIOPLASTY;  Surgeon: Serafina Mitchell, MD;  Location: [facility];  Service: Cardiovascular;  Laterality: Left;  PT  . POLYPECTOMY  08/03/2020   Procedure: POLYPECTOMY;  Surgeon: Otis Brace, MD;  Location: [facility];  Service: Gastroenterology;;  . RESECTION OF ARTERIOVENOUS FISTULA ANEURYSM Left 10/27/2019   Procedure: Resection Of Left Upper Arm  Arteriovenous Fistula Aneurysm Times Two;  Surgeon: Serafina Mitchell, MD;  Location: [facility];  Service: Vascular;  Laterality: Left;  . SHOULDER SURGERY Right   . SPHINCTEROTOMY  11/25/2018   Procedure: SPHINCTEROTOMY;  Surgeon: [name], MD;  Location: [facility];  Service: Gastroenterology;;  . Lavell Islam REMOVAL  12/20/2018   Procedure: STENT REMOVAL;  Surgeon: [name], MD;  Location: [facility];  Service: Gastroenterology;;  . TIBIA FRACTURE SURGERY      ROS:   General:  No weight loss, Fever, chills  HEENT: No recent headaches, no nasal bleeding, no visual changes, no sore throat  Neurologic: No dizziness, blackouts, seizures. No recent symptoms of stroke or mini- stroke. No recent episodes of slurred speech, or temporary blindness.  Cardiac: No recent episodes of chest pain/pressure, no shortness of breath at rest.  No shortness of breath with exertion.  Denies history of atrial fibrillation or irregular heartbeat  Vascular: No history of rest pain in feet.  No history of claudication.  No history of non-healing ulcer, No history of DVT   Pulmonary: No  home oxygen, no productive cough, no hemoptysis,  No asthma or wheezing  Musculoskeletal:  [ ]  Arthritis, [ ]  Low back pain,  [ ]  Joint pain  Hematologic:No history of hypercoagulable state.  No history of easy bleeding.  No history of anemia  Gastrointestinal: No hematochezia or melena,  No gastroesophageal reflux, no trouble swallowing  Urinary: [ ]  chronic Kidney disease, [ ]  on HD - [ ]  MWF or [ ]  TTHS, [ ]  Burning with urination, [ ]  Frequent urination, [ ]  Difficulty urinating;   Skin: No rashes  Psychological: No history of anxiety,  No history of depression  Social History Social History   Tobacco Use  . Smoking status: Never Smoker  . Smokeless tobacco: Former Systems developer    Types: Secondary school teacher  . Vaping Use: Never used  Substance Use Topics  . Alcohol use: Not Currently  . Drug use: No    Family History Family History  Problem Relation Age of Onset  . Heart disease Mother   . Heart disease Father   . Heart disease Sister     Allergies  Allergies  Allergen Reactions  . Avelox [Moxifloxacin Hcl] Other (See Comments)    Hallucinations   . Gabapentin Other (See Comments)    Caused staggering and  red feet  . Metoprolol Other (See Comments)    Dropped pulse too low; was taken off of this by MD  . Shellfish Allergy Nausea And Vomiting and Other (See Comments)    Made patient VERY nauseous and he developed stomach cramps  . Sulfa Antibiotics Other (See Comments)    Allergy is from childhood (reaction not recalled)  . Tetracyclines & Related Other (See Comments)    Blisters on hands and arms  . Adhesive [Tape] Itching and Rash    Paper tape only- skin cannot tolerate "heavy" tapes     Current Outpatient Medications  Medication Sig Dispense Refill  . aspirin EC 81 MG tablet Take 81 mg by mouth at bedtime.     Marland Kitchen atorvastatin (LIPITOR) 10 MG tablet Take 10 mg by mouth at bedtime.    Marland Kitchen buPROPion (WELLBUTRIN XL) 150 MG 24 hr tablet Take 150 mg by mouth in the  morning.    . docusate sodium (COLACE) 100 MG capsule Take 100 mg by mouth 2 (two) times daily.     Marland Kitchen escitalopram (LEXAPRO) 10 MG tablet Take 10 mg by mouth at bedtime.     . fluticasone (FLONASE) 50 MCG/ACT nasal spray Place 1 spray into both nostrils daily as needed for allergies or rhinitis.    Marland Kitchen lactulose, encephalopathy, (CHRONULAC) 10 GM/15ML SOLN Take 20 g by mouth daily.  4  . levothyroxine (SYNTHROID, LEVOTHROID) 125 MCG tablet Take 125 mcg by mouth daily.     Marland Kitchen lidocaine-prilocaine (EMLA) cream Apply 1 application topically Every Tuesday,Thursday,and Saturday with dialysis.  4  . linaclotide (LINZESS) 290 MCG CAPS capsule Take 290 mcg by mouth every evening.     . loratadine (CLARITIN) 10 MG tablet Take 10 mg by mouth daily.     . montelukast (SINGULAIR) 10 MG tablet Take 10 mg every evening by mouth.    . nitroGLYCERIN (NITROSTAT) 0.4 MG SL tablet Place 0.4 mg under the tongue every 5 (five) minutes as needed for chest pain.    Marland Kitchen oxyCODONE-acetaminophen (PERCOCET) 10-325 MG tablet Take 1 tablet by mouth every 4 (four) hours as needed for pain.    . pantoprazole (PROTONIX) 40 MG tablet Take 1 tablet (40 mg total) by mouth 2 (two) times daily. 60 tablet 2  . polyethylene glycol (MIRALAX / GLYCOLAX) 17 g packet Take 17 g by mouth daily as needed for moderate constipation.    Marland Kitchen PROAIR HFA 108 (90 Base) MCG/ACT inhaler Inhale 2 puffs into the lungs every 6 (six) hours as needed for shortness of breath.    . sevelamer carbonate (RENVELA) 800 MG tablet Take 3 tablets (2,400 mg total) by mouth 3 (three) times daily with meals. (Patient taking differently: Take 2,400 mg by mouth 2 (two) times daily with a meal.) 45 tablet 0   No current facility-administered medications for this visit.    Physical Examination  Vitals:   02/18/21 1414  BP: 126/70  Pulse: 83  Resp: 20  Temp: 97.9 F (36.6 C)  TempSrc: Temporal  SpO2: 98%  Weight: 204 lb 12.8 oz (92.9 kg)  Height: 5\' 10"  (1.778 m)     Body mass index is 29.39 kg/m.  General:  Alert and oriented, no acute distress HEENT: Normal Neck: No bruit or JVD Pulmonary: non labored breathing Cardiac: Regular Rate and Rhythm without murmur Abdomen: Soft, non-tender, non-distended, no mass, no scars Skin: No rash, no open wounds Musculoskeletal: No deformity or edema  Neurologic: Upper and lower extremity motor 5/5 and  symmetric  DATA:    +-----------+--------+-----+--------+---------+--------+  RIGHT   PSV cm/sRatioStenosisWaveform Comments  +-----------+--------+-----+--------+---------+--------+  CFA Distal 75          triphasic      +-----------+--------+-----+--------+---------+--------+  DFA    50          biphasic       +-----------+--------+-----+--------+---------+--------+  SFA Prox  86          biphasic       +-----------+--------+-----+--------+---------+--------+  SFA Mid  64          biphasic       +-----------+--------+-----+--------+---------+--------+  SFA Distal 47          biphasic       +-----------+--------+-----+--------+---------+--------+  POP Prox  46          triphasic      +-----------+--------+-----+--------+---------+--------+  POP Distal 36          biphasic       +-----------+--------+-----+--------+---------+--------+  ATA Distal        occluded           +-----------+--------+-----+--------+---------+--------+  PTA Distal 47          biphasic       +-----------+--------+-----+--------+---------+--------+  PERO Distal25          biphasic       +-----------+--------+-----+--------+---------+--------+        +-----------+--------+-----+--------+---------+--------+  LEFT    PSV cm/sRatioStenosisWaveform Comments   +-----------+--------+-----+--------+---------+--------+  CFA Distal 156          triphasic      +-----------+--------+-----+--------+---------+--------+  DFA    82          biphasic       +-----------+--------+-----+--------+---------+--------+  SFA Prox  136          triphasic      +-----------+--------+-----+--------+---------+--------+  SFA Mid  92          biphasic       +-----------+--------+-----+--------+---------+--------+  SFA Distal 96          biphasic       +-----------+--------+-----+--------+---------+--------+  POP Prox  72          biphasic       +-----------+--------+-----+--------+---------+--------+  POP Distal 45          biphasic       +-----------+--------+-----+--------+---------+--------+  ATA Distal        occluded           +-----------+--------+-----+--------+---------+--------+  PTA Distal 67          biphasic       +-----------+--------+-----+--------+---------+--------+  PERO Distal45          biphasic       +-----------+--------+-----+--------+---------+--------+     Summary:  Right: Total occlusion noted in the anterior tibial artery.   Left: Total occlusion noted in the anterior tibial artery.      ABI Findings:  +---------+------------------+-----+--------+--------+  Right  Rt Pressure (mmHg)IndexWaveformComment   +---------+------------------+-----+--------+--------+  Brachial 119                     +---------+------------------+-----+--------+--------+  ATA   0         0.00           +---------+------------------+-----+--------+--------+  PTA   255        2.14 biphasic      +---------+------------------+-----+--------+--------+  Great Toe95         0.80 Abnormal      +---------+------------------+-----+--------+--------+   +---------+------------------+-----+--------+---------------------+  Left  Lt Pressure (mmHg)IndexWaveformComment         +---------+------------------+-----+--------+---------------------+  Brachial                 HD access on left arm  +---------+------------------+-----+--------+---------------------+  ATA   0         0.00                  +---------+------------------+-----+--------+---------------------+  PTA   255        2.14 biphasic             +---------+------------------+-----+--------+---------------------+  Great Toe56        0.47 Abnormal             +---------+------------------+-----+--------+---------------------+   +-------+-----------+-----------+------------+------------+  ABI/TBIToday's ABIToday's TBIPrevious ABIPrevious TBI  +-------+-----------+-----------+------------+------------+  Right [place]     0.8    [place]     0.7       +-------+-----------+-----------+------------+------------+  Left  [place]     0.47    [place]     0.8       +-------+-----------+-----------+------------+------------+   Summary:  Right: Resting right ankle-brachial index indicates noncompressible right  lower extremity arteries. The right toe-brachial index is normal.   Left: Resting left ankle-brachial index indicates noncompressible left  lower extremity arteries. The left toe-brachial index is abnormal.   ASSESSMENT:  PAD with history of right foot non healing wound.  S/P Balloon angioplasty, left posterior tibial artery 01/15/21 and Angioplasty, right peroneal artery on 12/11/20.  He has no new complaints and today ABI is consistent with elevated index due to calcified vessels.  The arterial duplex shows biphasic flow B LE.   PLAN: He will stay as  activity as he tolerates.  If he develops new symptoms such as non healing wounds or rest pain he will call otherwise he will f/u in 9 months for repeat ABI's and Duplex studies.     Roxy Horseman PA-C [facility] Office: [phone]  MD in clinic [place]

## 2021-02-25 ENCOUNTER — Other Ambulatory Visit: Payer: Self-pay

## 2021-02-25 DIAGNOSIS — I739 Peripheral vascular disease, unspecified: Secondary | ICD-10-CM

## 2021-12-02 ENCOUNTER — Encounter (HOSPITAL_COMMUNITY): Payer: Medicare Other

## 2021-12-02 ENCOUNTER — Ambulatory Visit: Payer: Medicare Other

## 2021-12-30 ENCOUNTER — Ambulatory Visit (HOSPITAL_COMMUNITY)
Admission: RE | Admit: 2021-12-30 | Discharge: 2021-12-30 | Disposition: A | Discharge status: Home / Self Care | Payer: Medicare Other | Source: Ambulatory Visit | Attending: Surgery | Admitting: Surgery

## 2021-12-30 ENCOUNTER — Other Ambulatory Visit: Payer: Self-pay

## 2021-12-30 ENCOUNTER — Ambulatory Visit: Payer: Medicare Other | Admitting: Physician Assistant

## 2021-12-30 ENCOUNTER — Ambulatory Visit (INDEPENDENT_AMBULATORY_CARE_PROVIDER_SITE_OTHER)
Admission: RE | Admit: 2021-12-30 | Discharge: 2021-12-30 | Disposition: A | Discharge status: Home / Self Care | Payer: Medicare Other | Source: Ambulatory Visit | Attending: Surgery | Admitting: Surgery

## 2021-12-30 ENCOUNTER — Encounter: Payer: Self-pay | Admitting: Physician Assistant

## 2021-12-30 VITALS — BP 110/63 | HR 78 | Temp 97.9°F | Resp 20 | Ht 70.0 in | Wt 201.1 lb

## 2021-12-30 DIAGNOSIS — I739 Peripheral vascular disease, unspecified: Secondary | ICD-10-CM

## 2021-12-30 NOTE — Progress Notes (Signed)
HISTORY AND PHYSICAL     CC:  follow up. Requesting Provider:  Maris Berger, MD  HPI: This is a 81 y.o. male who is here today for follow up for PAD.  He has undergone left great toenail removal which has been healing.  He also has developed a blister on the right heel and dorsum of the foot.  He is referred for this reason.  On 12/11/2020 he underwent balloon angioplasty of a 90% right peroneal artery stenosis.  Dr. Trula Slade tried to recanalize his anterior tibial artery from antegrade as well as a pedal approach, however this was unsuccessful.  He was taken back the PV lab on 01/15/2021 and underwent balloon angioplasty of the left PTA also by Dr. Trula Slade.  The patient suffers from end-stage renal disease on dialysis secondary to diabetes and hypertension.  He is a non-smoker.  He has COPD.  He has a history of coronary artery disease.  He had a mild nonobstructive pattern on cardiac catheterization in 2016.    Pt was last seen 02/18/2021 and at that time, he was not having any rest pain, non healing wounds or claudication.  The pt returns today for follow up.  He states he is doing well.  He denies any claudication, non healing wounds or rest pain.  He states that the feeling in the right is better than the left, but it has always been that way.  He is compliant with his asa/statin.    He states that he recently had an intervention on his LUA AVF by Dr. Augustin Coupe at [facility].  States his fistula is working well.  He dialyzes T/T/S at the [facility] location.   The pt is on a statin for cholesterol management.    The pt is on an aspirin.    Other AC:  none The pt is not on medication for hypertension.  The pt does have diabetes. Tobacco hx:  never    Past Medical History:  Diagnosis Date   Acute cholecystitis 08/17/2018   Anemia    Arthritis    Atrial fibrillation (HCC)    CAD (coronary artery disease)    Cholecystitis    COPD (chronic obstructive pulmonary disease) (HCC)    Depression     Diabetes mellitus without complication (HCC)    ESRD (end stage renal disease) on dialysis (HCC)    HOH (hard of hearing)    Hypertension    Hypothyroidism    OSA (obstructive sleep apnea)    does not wear cpap   Renal disorder    Spinal stenosis    Wears dentures    wears top dentures   Wears glasses    Wears hearing aid in both ears     Past Surgical History:  Procedure Laterality Date   ABDOMINAL AORTOGRAM N/A 01/15/2021   Procedure: ABDOMINAL AORTOGRAM;  Surgeon: Serafina Mitchell, MD;  Location: [place] [facility];  Service: Cardiovascular;  Laterality: N/A;   ABDOMINAL AORTOGRAM W/LOWER EXTREMITY Bilateral 12/11/2020   Procedure: ABDOMINAL AORTOGRAM W/LOWER EXTREMITY;  Surgeon: Serafina Mitchell, MD;  Location: [place] [facility];  Service: Cardiovascular;  Laterality: Bilateral;   BASCILIC VEIN TRANSPOSITION Left 12/02/2019   Procedure: BASCILIC VEIN TRANSPOSITION LEFT FIRST STAGE;  Surgeon: Serafina Mitchell, MD;  Location: [facility];  Service: Vascular;  Laterality: Left;   BASCILIC VEIN TRANSPOSITION Left 02/10/2020   Procedure: [place] LEFT SECOND STAGE;  Surgeon: Serafina Mitchell, MD;  Location: [place];  Service: Vascular;  Laterality: Left;   BILIARY STENT PLACEMENT  11/25/2018   Procedure: BILIARY STENT PLACEMENT;  Surgeon: [name], MD;  Location: [facility];  Service: Gastroenterology;;   BIOPSY  12/20/2018   Procedure: BIOPSY;  Surgeon: [name], MD;  Location: [facility];  Service: Gastroenterology;;   BIOPSY  08/03/2020   Procedure: BIOPSY;  Surgeon: Otis Brace, MD;  Location: [facility];  Service: Gastroenterology;;  EGD and COLON   CARDIAC CATHETERIZATION     CHOLECYSTECTOMY N/A 11/14/2018   Procedure: LAPAROSCOPIC CHOLECYSTECTOMY WITH INTRAOPERATIVE CHOLANGIOGRAM;  Surgeon: Coralie Keens, MD;  Location: [place];  Service: General;  Laterality: N/A;   COLONOSCOPY W/ BIOPSIES AND POLYPECTOMY     COLONOSCOPY WITH PROPOFOL N/A 08/03/2020    Procedure: COLONOSCOPY WITH PROPOFOL;  Surgeon: Otis Brace, MD;  Location: [facility];  Service: Gastroenterology;  Laterality: N/A;   ERCP N/A 11/25/2018   Procedure: ENDOSCOPIC RETROGRADE CHOLANGIOPANCREATOGRAPHY (ERCP);  Surgeon: [name], MD;  Location: [place];  Service: Gastroenterology;  Laterality: N/A;   ESOPHAGOGASTRODUODENOSCOPY N/A 12/20/2018   Procedure: ESOPHAGOGASTRODUODENOSCOPY (EGD);  Surgeon: [name], MD;  Location: [facility];  Service: Gastroenterology;  Laterality: N/A;   ESOPHAGOGASTRODUODENOSCOPY (EGD) WITH PROPOFOL N/A 08/03/2020   Procedure: ESOPHAGOGASTRODUODENOSCOPY (EGD) WITH PROPOFOL;  Surgeon: Otis Brace, MD;  Location: [facility];  Service: Gastroenterology;  Laterality: N/A;   EYE SURGERY     cataract B/L   HAND SURGERY Right    IR DIALY SHUNT INTRO NEEDLE/INTRACATH INITIAL W/IMG LEFT Left 08/20/2018   IR PERC CHOLECYSTOSTOMY  08/19/2018   IR RADIOLOGIST EVAL & MGMT  09/29/2018   LOWER EXTREMITY ANGIOGRAPHY Left 01/15/2021   Procedure: Lower Extremity Angiography;  Surgeon: Serafina Mitchell, MD;  Location: [place] [facility];  Service: Cardiovascular;  Laterality: Left;   MULTIPLE TOOTH EXTRACTIONS     PERIPHERAL VASCULAR BALLOON ANGIOPLASTY Right 12/11/2020   Procedure: PERIPHERAL VASCULAR BALLOON ANGIOPLASTY;  Surgeon: Serafina Mitchell, MD;  Location: [place] [facility];  Service: Cardiovascular;  Laterality: Right;   PERIPHERAL VASCULAR BALLOON ANGIOPLASTY Left 01/15/2021   Procedure: PERIPHERAL VASCULAR BALLOON ANGIOPLASTY;  Surgeon: Serafina Mitchell, MD;  Location: [place] [facility];  Service: Cardiovascular;  Laterality: Left;  PT   POLYPECTOMY  08/03/2020   Procedure: POLYPECTOMY;  Surgeon: Otis Brace, MD;  Location: [facility];  Service: Gastroenterology;;   RESECTION OF ARTERIOVENOUS FISTULA ANEURYSM Left 10/27/2019   Procedure: Resection Of Left Upper Arm  Arteriovenous Fistula Aneurysm Times Two;  Surgeon: Serafina Mitchell, MD;  Location: [place];  Service: Vascular;  Laterality: Left;   SHOULDER SURGERY Right    SPHINCTEROTOMY  11/25/2018   Procedure: SPHINCTEROTOMY;  Surgeon: [name], MD;  Location: [place];  Service: Gastroenterology;;   Lavell Islam REMOVAL  12/20/2018   Procedure: STENT REMOVAL;  Surgeon: [name], MD;  Location: [facility];  Service: Gastroenterology;;   TIBIA FRACTURE SURGERY      Allergies  Allergen Reactions   Avelox [Moxifloxacin Hcl] Other (See Comments)    Hallucinations    Gabapentin Other (See Comments)    Caused staggering and red feet   Metoprolol Other (See Comments)    Dropped pulse too low; was taken off of this by MD   Shellfish Allergy Nausea And Vomiting and Other (See Comments)    Made patient VERY nauseous and he developed stomach cramps   Sulfa Antibiotics Other (See Comments)    Allergy is from childhood (reaction not recalled)   Tetracyclines & Related Other (See Comments)  Blisters on hands and arms   Adhesive [Tape] Itching and Rash    Paper tape only- skin cannot tolerate "heavy" tapes    Current Outpatient Medications  Medication Sig Dispense Refill   aspirin EC 81 MG tablet Take 81 mg by mouth at bedtime.      atorvastatin (LIPITOR) 10 MG tablet Take 10 mg by mouth at bedtime.     buPROPion (WELLBUTRIN XL) 150 MG 24 hr tablet Take 150 mg by mouth in the morning.     docusate sodium (COLACE) 100 MG capsule Take 100 mg by mouth 2 (two) times daily.      escitalopram (LEXAPRO) 10 MG tablet Take 10 mg by mouth at bedtime.      fluticasone (FLONASE) 50 MCG/ACT nasal spray Place 1 spray into both nostrils daily as needed for allergies or rhinitis.     lactulose, encephalopathy, (CHRONULAC) 10 GM/15ML SOLN Take 20 g by mouth daily.  4   levothyroxine (SYNTHROID, LEVOTHROID) 125 MCG tablet Take 125 mcg by mouth daily.      lidocaine-prilocaine (EMLA) cream Apply 1 application topically Every Tuesday,Thursday,and Saturday with dialysis.  4    linaclotide (LINZESS) 290 MCG CAPS capsule Take 290 mcg by mouth every evening.      loratadine (CLARITIN) 10 MG tablet Take 10 mg by mouth daily.      montelukast (SINGULAIR) 10 MG tablet Take 10 mg every evening by mouth.     nitroGLYCERIN (NITROSTAT) 0.4 MG SL tablet Place 0.4 mg under the tongue every 5 (five) minutes as needed for chest pain.     oxyCODONE-acetaminophen (PERCOCET) 10-325 MG tablet Take 1 tablet by mouth every 4 (four) hours as needed for pain.     pantoprazole (PROTONIX) 40 MG tablet Take 1 tablet (40 mg total) by mouth 2 (two) times daily. 60 tablet 2   polyethylene glycol (MIRALAX / GLYCOLAX) 17 g packet Take 17 g by mouth daily as needed for moderate constipation.     PROAIR HFA 108 (90 Base) MCG/ACT inhaler Inhale 2 puffs into the lungs every 6 (six) hours as needed for shortness of breath.     sevelamer carbonate (RENVELA) 800 MG tablet Take 3 tablets (2,400 mg total) by mouth 3 (three) times daily with meals. (Patient taking differently: Take 2,400 mg by mouth 2 (two) times daily with a meal.) 45 tablet 0   No current facility-administered medications for this visit.    Family History  Problem Relation Age of Onset   Heart disease Mother    Heart disease Father    Heart disease Sister     Social History   Socioeconomic History   Marital status: Married    Spouse name: Di Kindle   Number of children: Not on file   Years of education: Not on file   Highest education level: Not on file  Occupational History   Not on file  Tobacco Use   Smoking status: Never   Smokeless tobacco: Former    Types: Nurse, children's Use: Never used  Substance and Sexual Activity   Alcohol use: Not Currently   Drug use: No   Sexual activity: Not on file  Other Topics Concern   Not on file  Social History Narrative   Not on file   Social Determinants of Health   Financial Resource Strain: Not on file  Food Insecurity: Not on file  Transportation Needs: Not on  file  Physical Activity: Not on file  Stress: Not  on file  Social Connections: Not on file  Intimate Partner Violence: Not on file     REVIEW OF SYSTEMS:   [X]  denotes positive finding, [ ]  denotes negative finding Cardiac  Comments:  Chest pain or chest pressure:    Shortness of breath upon exertion:    Short of breath when lying flat:    Irregular heart rhythm:        Vascular    Pain in calf, thigh, or hip brought on by ambulation:    Pain in feet at night that wakes you up from your sleep:     Blood clot in your veins:    Leg swelling:         Pulmonary    Oxygen at home:    Productive cough:     Wheezing:         Neurologic    Sudden weakness in arms or legs:     Sudden numbness in arms or legs:     Sudden onset of difficulty speaking or slurred speech:    Temporary loss of vision in one eye:     Problems with dizziness:         Gastrointestinal    Blood in stool:     Vomited blood:         Genitourinary    Burning when urinating:     Blood in urine:        Psychiatric    Major depression:         Hematologic    Bleeding problems:    Problems with blood clotting too easily:        Skin    Rashes or ulcers:        Constitutional    Fever or chills:      PHYSICAL EXAMINATION:  Today's Vitals   12/30/21 1118  BP: 110/63  Pulse: 78  Resp: 20  Temp: 97.9 F (36.6 C)  TempSrc: Temporal  SpO2: 98%  Weight: 201 lb 1.6 oz (91.2 kg)  Height: 5\' 10"  (1.778 m)   Body mass index is 28.85 kg/m.   General:  WDWN in NAD; vital signs documented above Gait: Not observed HENT: WNL, normocephalic Pulmonary: normal non-labored breathing , without wheezing Cardiac: regular HR, without carotid bruits Abdomen: soft, NT, no masses; aortic pulse is not palpable Skin: without rashes Vascular Exam/Pulses:  Right Left  Radial 2+ (normal) 2+ (normal)  DP biphasic monophasic  PT Monophasic-brisk Monophasic brisk  Peroneal monophasic monophasic    Extremities: without ischemic changes, without Gangrene , without cellulitis; without open wounds;  Musculoskeletal: no muscle wasting or atrophy  Neurologic: A&O X 3 Psychiatric:  The pt has Normal affect.   Non-Invasive Vascular Imaging:   ABI's/TBI's on 12/30/2021: Right:  [place]/0.46 - Great toe pressure: 59 Left:  [place]/0.46 - Great toe pressure: 59  Arterial duplex on 12/30/2021: +-----------+--------+-----+--------+----------+--------+   RIGHT       PSV cm/s Ratio Stenosis Waveform   Comments   +-----------+--------+-----+--------+----------+--------+   CFA Prox    96                      triphasic             +-----------+--------+-----+--------+----------+--------+   DFA         63                      biphasic              +-----------+--------+-----+--------+----------+--------+  SFA Prox    69                      biphasic              +-----------+--------+-----+--------+----------+--------+   SFA Mid     91                      triphasic             +-----------+--------+-----+--------+----------+--------+   SFA Distal  74                      triphasic             +-----------+--------+-----+--------+----------+--------+   POP Prox    49                      biphasic              +-----------+--------+-----+--------+----------+--------+   POP Mid     35                      triphasic             +-----------+--------+-----+--------+----------+--------+   POP Distal  40                      triphasic             +-----------+--------+-----+--------+----------+--------+   ATA Distal  80                      monophasic            +-----------+--------+-----+--------+----------+--------+   PTA Distal  62                      triphasic             +-----------+--------+-----+--------+----------+--------+   PERO Distal 14                      biphasic   dampened   +-----------+--------+-----+--------+----------+--------+    +-----------+--------+-----+---------------+---------+--------+   LEFT        PSV cm/s Ratio Stenosis        Waveform  Comments   +-----------+--------+-----+---------------+---------+--------+   CFA Prox    87                             triphasic            +-----------+--------+-----+---------------+---------+--------+   CFA Mid                                    biphasic             +-----------+--------+-----+---------------+---------+--------+   CFA Distal  87                             triphasic            +-----------+--------+-----+---------------+---------+--------+   DFA         99                                                  +-----------+--------+-----+---------------+---------+--------+  SFA Prox    175                            biphasic             +-----------+--------+-----+---------------+---------+--------+   SFA Mid     87                             triphasic            +-----------+--------+-----+---------------+---------+--------+   SFA Distal  46                             triphasic            +-----------+--------+-----+---------------+---------+--------+   POP Prox    42                             biphasic             +-----------+--------+-----+---------------+---------+--------+   POP Mid     101                            biphasic             +-----------+--------+-----+---------------+---------+--------+   POP Distal  92                             biphasic             +-----------+--------+-----+---------------+---------+--------+   ATA Distal  243            50-74% stenosis biphasic  stenotic   +-----------+--------+-----+---------------+---------+--------+   PTA Distal  102                            triphasic            +-----------+--------+-----+---------------+---------+--------+   PERO Distal 72                             biphasic             +-----------+--------+-----+---------------+---------+--------+   Summary:  Right: Patent  right lower extremity with no visualized stenosis. Probable  tibial artery occlusive disease   Left: Patent left lower extremity with no visualized stenosis. Patent left  posterior tibial artery with increased velocity in the 50 - 74% stenosis  range.   Previous ABI's/TBI's on 02/18/2021: Right:  [place]/0.80 - Great toe pressure: 95 Left:  [place]/0.47 - Great toe pressure:  56  Previous arterial duplex on 3.27.2022: Right: Total occlusion noted in the anterior tibial artery.  Left: Total occlusion noted in the anterior tibial artery.    ASSESSMENT/PLAN:: 81 y.o. male here for follow up for PAD with hx of balloon angioplasty of a 90% right peroneal artery stenosis on 12/11/2020 by Dr. Trula Slade and balloon angioplasty of the left PTA also by Dr. Trula Slade on 12/26/2020  -pt doing well without rest pain, claudication or non healing wounds.  He did have a drop in his right toe pressure, but his doppler signals in the both feet are brisk.  Duplex did show a 50-74% stenosis in the left ATA, but given the pt is asymptomatic, we  will follow this.   -pt will f/u in 6 months with BLE arterial duplex and ABI.  He knows to call sooner if he develops any non healing wounds or rest pain. -continue statin/asa   Leontine Locket, [facility] [phone]  Clinic MD:   Trula Slade

## 2022-01-02 ENCOUNTER — Other Ambulatory Visit: Payer: Self-pay | Admitting: *Deleted

## 2022-01-02 DIAGNOSIS — I70234 Atherosclerosis of native arteries of right leg with ulceration of heel and midfoot: Secondary | ICD-10-CM

## 2022-01-02 DIAGNOSIS — I739 Peripheral vascular disease, unspecified: Secondary | ICD-10-CM

## 2022-02-26 ENCOUNTER — Encounter: Payer: Self-pay | Admitting: Vascular Surgery

## 2022-02-26 ENCOUNTER — Ambulatory Visit (INDEPENDENT_AMBULATORY_CARE_PROVIDER_SITE_OTHER): Payer: Medicare Other | Admitting: Vascular Surgery

## 2022-02-26 VITALS — BP 126/77 | HR 88 | Temp 98.1°F | Resp 20 | Ht 70.0 in | Wt 203.0 lb

## 2022-02-26 DIAGNOSIS — N186 End stage renal disease: Secondary | ICD-10-CM

## 2022-02-26 DIAGNOSIS — I739 Peripheral vascular disease, unspecified: Secondary | ICD-10-CM

## 2022-02-26 DIAGNOSIS — Z992 Dependence on renal dialysis: Secondary | ICD-10-CM

## 2022-02-26 NOTE — Progress Notes (Signed)
? ?Patient ID: Christian Dalton, male   DOB: 1941-06-23, 81 y.o.   MRN: [id number] ? ?Reason for Consult: Follow-up ?  ?Referred by Maris Berger, MD ? ?Subjective:  ?   ?HPI: ? ?Christian Dalton is a 81 y.o. male with end-stage renal disease.  He has previously undergone angiography of his bilateral lower extremities with attempted retrograde revascularization of the right lower extremity.  He remains on dialysis Tuesdays, Thursdays and Saturdays.  Unfortunately he has developed an ulceration of the right lateral foot and fifth toe.  He does not have any fevers or chills.  He has been placing triple antibiotic ointment on this.  He is taking aspirin and statin. ? ?Past Medical History:  ?Diagnosis Date  ? Acute cholecystitis 08/17/2018  ? Anemia   ? Arthritis   ? Atrial fibrillation ([place])   ? CAD (coronary artery disease)   ? Cholecystitis   ? COPD (chronic obstructive pulmonary disease) ([place])   ? Depression   ? Diabetes mellitus without complication ([place])   ? ESRD (end stage renal disease) on dialysis [facility])   ? HOH (hard of hearing)   ? Hypertension   ? Hypothyroidism   ? OSA (obstructive sleep apnea)   ? does not wear cpap  ? Renal disorder   ? Spinal stenosis   ? Wears dentures   ? wears top dentures  ? Wears glasses   ? Wears hearing aid in both ears   ? ?Family History  ?Problem Relation Age of Onset  ? Heart disease Mother   ? Heart disease Father   ? Heart disease Sister   ? ?Past Surgical History:  ?Procedure Laterality Date  ? ABDOMINAL AORTOGRAM N/A 01/15/2021  ? Procedure: ABDOMINAL AORTOGRAM;  Surgeon: Serafina Mitchell, MD;  Location: [place] [facility];  Service: Cardiovascular;  Laterality: N/A;  ? ABDOMINAL AORTOGRAM W/LOWER EXTREMITY Bilateral 12/11/2020  ? Procedure: ABDOMINAL AORTOGRAM W/LOWER EXTREMITY;  Surgeon: Serafina Mitchell, MD;  Location: [place] [facility];  Service: Cardiovascular;  Laterality: Bilateral;  ? BASCILIC VEIN TRANSPOSITION Left 12/02/2019  ? Procedure: BASCILIC VEIN TRANSPOSITION LEFT  FIRST STAGE;  Surgeon: Serafina Mitchell, MD;  Location: [place];  Service: Vascular;  Laterality: Left;  ? BASCILIC VEIN TRANSPOSITION Left 02/10/2020  ? Procedure: BASCILIC VEIN TRANSPOSITION LEFT SECOND STAGE;  Surgeon: Serafina Mitchell, MD;  Location: [place];  Service: Vascular;  Laterality: Left;  ? BILIARY STENT PLACEMENT  11/25/2018  ? Procedure: BILIARY STENT PLACEMENT;  Surgeon: [name], MD;  Location: [place];  Service: Gastroenterology;;  ? BIOPSY  12/20/2018  ? Procedure: BIOPSY;  Surgeon: [name], MD;  Location[facility];  Service: Gastroenterology;;  ? BIOPSY  08/03/2020  ? Procedure: BIOPSY;  Surgeon: Otis Brace, MD;  Location: [facility];  Service: Gastroenterology;;  EGD and COLON  ? CARDIAC CATHETERIZATION    ? CHOLECYSTECTOMY N/A 11/14/2018  ? Procedure: LAPAROSCOPIC CHOLECYSTECTOMY WITH INTRAOPERATIVE CHOLANGIOGRAM;  Surgeon: Coralie Keens, MD;  Location: [place];  Service: General;  Laterality: N/A;  ? COLONOSCOPY W/ BIOPSIES AND POLYPECTOMY    ? COLONOSCOPY WITH PROPOFOL N/A 08/03/2020  ? Procedure: COLONOSCOPY WITH PROPOFOL;  Surgeon: Otis Brace, MD;  [facility];  Service: Gastroenterology;  Laterality: N/A;  ? ERCP N/A 11/25/2018  ? Procedure: ENDOSCOPIC RETROGRADE CHOLANGIOPANCREATOGRAPHY (ERCP);  Surgeon: [name], MD;  Location: [place];  Service: Gastroenterology;  Laterality: N/A;  ? ESOPHAGOGASTRODUODENOSCOPY N/A 12/20/2018  ? Procedure: ESOPHAGOGASTRODUODENOSCOPY (EGD);  Surgeon: [name], MD;  Location: [facility];  Service: Gastroenterology;  Laterality: N/A;  ? ESOPHAGOGASTRODUODENOSCOPY (EGD) WITH PROPOFOL N/A 08/03/2020  ? Procedure: ESOPHAGOGASTRODUODENOSCOPY (EGD) WITH PROPOFOL;  Surgeon: Otis Brace, MD;  [facility];  Service: Gastroenterology;  Laterality: N/A;  ? EYE SURGERY    ? cataract B/L  ? HAND SURGERY Right   ? IR DIALY SHUNT INTRO NEEDLE/INTRACATH INITIAL W/IMG LEFT Left 08/20/2018  ? IR PERC  CHOLECYSTOSTOMY  08/19/2018  ? IR RADIOLOGIST EVAL & MGMT  09/29/2018  ? LOWER EXTREMITY ANGIOGRAPHY Left 01/15/2021  ? Procedure: Lower Extremity Angiography;  Surgeon: Serafina Mitchell, MD;  Location: [place] [facility];  Service: Cardiovascular;  Laterality: Left;  ? MULTIPLE TOOTH EXTRACTIONS    ? PERIPHERAL VASCULAR BALLOON ANGIOPLASTY Right 12/11/2020  ? Procedure: PERIPHERAL VASCULAR BALLOON ANGIOPLASTY;  Surgeon: Serafina Mitchell, MD;  Location: [place] [facility];  Service: Cardiovascular;  Laterality: Right;  ? PERIPHERAL VASCULAR BALLOON ANGIOPLASTY Left 01/15/2021  ? Procedure: PERIPHERAL VASCULAR BALLOON ANGIOPLASTY;  Surgeon: Serafina Mitchell, MD;  Location: [place] [facility];  Service: Cardiovascular;  Laterality: Left;  PT  ? POLYPECTOMY  08/03/2020  ? Procedure: POLYPECTOMY;  Surgeon: Otis Brace, MD;  [facility]: [facility];  Service: Gastroenterology;;  ? RESECTION OF ARTERIOVENOUS FISTULA ANEURYSM Left 10/27/2019  ? Procedure: Resection Of Left Upper Arm  Arteriovenous Fistula Aneurysm Times Two;  Surgeon: Serafina Mitchell, MD;  Location: [place];  Service: Vascular;  Laterality: Left;  ? SHOULDER SURGERY Right   ? SPHINCTEROTOMY  11/25/2018  ? Procedure: SPHINCTEROTOMY;  Surgeon: [name], MD;  Location: [place];  Service: Gastroenterology;;  ? [place]  12/20/2018  ? Procedure: STENT REMOVAL;  Surgeon: [name], MD;  Location: [facility];  Service: Gastroenterology;;  ? [place]    ? ? ?Short Social History:  ?Social History  ? ?Tobacco Use  ? Smoking status: Never  ? Smokeless tobacco: Former  ?  Types: Chew  ?Substance Use Topics  ? Alcohol use: Not Currently  ? ? ?Allergies  ?Allergen Reactions  ? Avelox [Moxifloxacin Hcl] Other (See Comments)  ?  Hallucinations ?  ? Gabapentin Other (See Comments)  ?  Caused staggering and red feet  ? Metoprolol Other (See Comments)  ?  Dropped pulse too low; was taken off of this by MD  ? Quinolones Other (See Comments)  ?  Shellfish Allergy Nausea And Vomiting and Other (See Comments)  ?  Made patient VERY nauseous and he developed stomach cramps  ? Sulfa Antibiotics Other (See Comments)  ?  Allergy is from childhood (reaction not recalled)  ? Tetracyclines & Related Other (See Comments)  ?  Blisters on hands and arms  ? Adhesive [Tape] Itching and Rash  ?  Paper tape only- skin cannot tolerate "heavy" tapes  ? ? ?Current Outpatient Medications  ?Medication Sig Dispense Refill  ? aspirin EC 81 MG tablet Take 81 mg by mouth at bedtime.     ? atorvastatin (LIPITOR) 10 MG tablet Take 10 mg by mouth at bedtime.    ? buPROPion (WELLBUTRIN XL) 150 MG 24 hr tablet Take 150 mg by mouth in the morning.    ? docusate sodium (COLACE) 100 MG capsule Take 100 mg by mouth 2 (two) times daily.     ? escitalopram (LEXAPRO) 10 MG tablet Take 10 mg by mouth at bedtime.     ? fluticasone (FLONASE) 50 MCG/ACT nasal spray Place 1 spray into both nostrils daily as needed for allergies or rhinitis.    ?  lactulose, encephalopathy, (CHRONULAC) 10 GM/15ML SOLN Take 20 g by mouth daily.  4  ? levothyroxine (SYNTHROID, LEVOTHROID) 125 MCG tablet Take 125 mcg by mouth daily.     ? lidocaine-prilocaine (EMLA) cream Apply 1 application topically Every Tuesday,Thursday,and Saturday with dialysis.  4  ? linaclotide (LINZESS) 290 MCG CAPS capsule Take 290 mcg by mouth every evening.     ? loratadine (CLARITIN) 10 MG tablet Take 10 mg by mouth daily.     ? montelukast (SINGULAIR) 10 MG tablet Take 10 mg every evening by mouth.    ? nitroGLYCERIN (NITROSTAT) 0.4 MG SL tablet Place 0.4 mg under the tongue every 5 (five) minutes as needed for chest pain.    ? oxyCODONE-acetaminophen (PERCOCET) 10-325 MG tablet Take 1 tablet by mouth every 4 (four) hours as needed for pain.    ? polyethylene glycol (MIRALAX / GLYCOLAX) 17 g packet Take 17 g by mouth daily as needed for moderate constipation.    ? PROAIR HFA 108 (90 Base) MCG/ACT inhaler Inhale 2 puffs into the lungs every  6 (six) hours as needed for shortness of breath.    ? sevelamer carbonate (RENVELA) 800 MG tablet Take 3 tablets (2,400 mg total) by mouth 3 (three) times daily with meals. (Patient taking differently: Take 2,400 mg by m

## 2022-02-26 NOTE — H&P (View-Only) (Signed)
? ?Patient ID: Christian Dalton, male   DOB: 1941/09/02, 81 y.o.   MRN: [id number] ? ?Reason for Consult: Follow-up ?  ?Referred by Maris Berger, MD ? ?Subjective:  ?   ?HPI: ? ?Christian Dalton is a 81 y.o. male with end-stage renal disease.  He has previously undergone angiography of his bilateral lower extremities with attempted retrograde revascularization of the right lower extremity.  He remains on dialysis Tuesdays, Thursdays and Saturdays.  Unfortunately he has developed an ulceration of the right lateral foot and fifth toe.  He does not have any fevers or chills.  He has been placing triple antibiotic ointment on this.  He is taking aspirin and statin. ? ?Past Medical History:  ?Diagnosis Date  ? Acute cholecystitis 08/17/2018  ? Anemia   ? Arthritis   ? Atrial fibrillation ([place])   ? CAD (coronary artery disease)   ? Cholecystitis   ? COPD (chronic obstructive pulmonary disease) ([place])   ? Depression   ? Diabetes mellitus without complication ([place])   ? ESRD (end stage renal disease) on dialysis [facility])   ? HOH (hard of hearing)   ? Hypertension   ? Hypothyroidism   ? OSA (obstructive sleep apnea)   ? does not wear cpap  ? Renal disorder   ? Spinal stenosis   ? Wears dentures   ? wears top dentures  ? Wears glasses   ? Wears hearing aid in both ears   ? ?Family History  ?Problem Relation Age of Onset  ? Heart disease Mother   ? Heart disease Father   ? Heart disease Sister   ? ?Past Surgical History:  ?Procedure Laterality Date  ? ABDOMINAL AORTOGRAM N/A 01/15/2021  ? Procedure: ABDOMINAL AORTOGRAM;  Surgeon: Serafina Mitchell, MD;  Location: [place] [facility];  Service: Cardiovascular;  Laterality: N/A;  ? ABDOMINAL AORTOGRAM W/LOWER EXTREMITY Bilateral 12/11/2020  ? Procedure: ABDOMINAL AORTOGRAM W/LOWER EXTREMITY;  Surgeon: Serafina Mitchell, MD;  Location: [place] [facility];  Service: Cardiovascular;  Laterality: Bilateral;  ? BASCILIC VEIN TRANSPOSITION Left 12/02/2019  ? Procedure: BASCILIC VEIN TRANSPOSITION LEFT  FIRST STAGE;  Surgeon: Serafina Mitchell, MD;  Location: [place];  Service: Vascular;  Laterality: Left;  ? BASCILIC VEIN TRANSPOSITION Left 02/10/2020  ? Procedure: BASCILIC VEIN TRANSPOSITION LEFT SECOND STAGE;  Surgeon: Serafina Mitchell, MD;  Location: [place];  Service: Vascular;  Laterality: Left;  ? BILIARY STENT PLACEMENT  11/25/2018  ? Procedure: BILIARY STENT PLACEMENT;  Surgeon: [name], MD;  Location: [place];  Service: Gastroenterology;;  ? BIOPSY  12/20/2018  ? Procedure: BIOPSY;  Surgeon: [name], MD;  Location: [facility];  Service: Gastroenterology;;  ? BIOPSY  08/03/2020  ? Procedure: BIOPSY;  Surgeon: Otis Brace, MD;  [facility]: [facility];  Service: Gastroenterology;;  EGD and COLON  ? CARDIAC CATHETERIZATION    ? CHOLECYSTECTOMY N/A 11/14/2018  ? Procedure: LAPAROSCOPIC CHOLECYSTECTOMY WITH INTRAOPERATIVE CHOLANGIOGRAM;  Surgeon: Coralie Keens, MD;  Location: [place];  Service: General;  Laterality: N/A;  ? COLONOSCOPY W/ BIOPSIES AND POLYPECTOMY    ? COLONOSCOPY WITH PROPOFOL N/A 08/03/2020  ? Procedure: COLONOSCOPY WITH PROPOFOL;  Surgeon: Otis Brace, MD;  [facility];  Service: Gastroenterology;  Laterality: N/A;  ? ERCP N/A 11/25/2018  ? Procedure: ENDOSCOPIC RETROGRADE CHOLANGIOPANCREATOGRAPHY (ERCP);  Surgeon: [name], MD;  Location: [place];  Service: Gastroenterology;  Laterality: N/A;  ? ESOPHAGOGASTRODUODENOSCOPY N/A 12/20/2018  ? Procedure: ESOPHAGOGASTRODUODENOSCOPY (EGD);  Surgeon: [name], MD;  Location[facility];  Service: Gastroenterology;  Laterality: N/A;  ? ESOPHAGOGASTRODUODENOSCOPY (EGD) WITH PROPOFOL N/A 08/03/2020  ? Procedure: ESOPHAGOGASTRODUODENOSCOPY (EGD) WITH PROPOFOL;  Surgeon: Otis Brace, MD;  [facility];  Service: Gastroenterology;  Laterality: N/A;  ? EYE SURGERY    ? cataract B/L  ? HAND SURGERY Right   ? IR DIALY SHUNT INTRO NEEDLE/INTRACATH INITIAL W/IMG LEFT Left 08/20/2018  ? IR PERC  CHOLECYSTOSTOMY  08/19/2018  ? IR RADIOLOGIST EVAL & MGMT  09/29/2018  ? LOWER EXTREMITY ANGIOGRAPHY Left 01/15/2021  ? Procedure: Lower Extremity Angiography;  Surgeon: Serafina Mitchell, MD;  Location: [place] [facility];  Service: Cardiovascular;  Laterality: Left;  ? MULTIPLE TOOTH EXTRACTIONS    ? PERIPHERAL VASCULAR BALLOON ANGIOPLASTY Right 12/11/2020  ? Procedure: PERIPHERAL VASCULAR BALLOON ANGIOPLASTY;  Surgeon: Serafina Mitchell, MD;  Location: [place] [facility];  Service: Cardiovascular;  Laterality: Right;  ? PERIPHERAL VASCULAR BALLOON ANGIOPLASTY Left 01/15/2021  ? Procedure: PERIPHERAL VASCULAR BALLOON ANGIOPLASTY;  Surgeon: Serafina Mitchell, MD;  Location: [place] [facility];  Service: Cardiovascular;  Laterality: Left;  PT  ? POLYPECTOMY  08/03/2020  ? Procedure: POLYPECTOMY;  Surgeon: Otis Brace, MD;  [facility]: [facility];  Service: Gastroenterology;;  ? RESECTION OF ARTERIOVENOUS FISTULA ANEURYSM Left 10/27/2019  ? Procedure: Resection Of Left Upper Arm  Arteriovenous Fistula Aneurysm Times Two;  Surgeon: Serafina Mitchell, MD;  Location: [place];  Service: Vascular;  Laterality: Left;  ? SHOULDER SURGERY Right   ? SPHINCTEROTOMY  11/25/2018  ? Procedure: SPHINCTEROTOMY;  Surgeon: [name], MD;  Location: [place];  Service: Gastroenterology;;  ? [place]  12/20/2018  ? Procedure: STENT REMOVAL;  Surgeon: [name], MD;  Location: [facility];  Service: Gastroenterology;;  ? [place]    ? ? ?Short Social History:  ?Social History  ? ?Tobacco Use  ? Smoking status: Never  ? Smokeless tobacco: Former  ?  Types: Chew  ?Substance Use Topics  ? Alcohol use: Not Currently  ? ? ?Allergies  ?Allergen Reactions  ? Avelox [Moxifloxacin Hcl] Other (See Comments)  ?  Hallucinations ?  ? Gabapentin Other (See Comments)  ?  Caused staggering and red feet  ? Metoprolol Other (See Comments)  ?  Dropped pulse too low; was taken off of this by MD  ? Quinolones Other (See Comments)  ?  Shellfish Allergy Nausea And Vomiting and Other (See Comments)  ?  Made patient VERY nauseous and he developed stomach cramps  ? Sulfa Antibiotics Other (See Comments)  ?  Allergy is from childhood (reaction not recalled)  ? Tetracyclines & Related Other (See Comments)  ?  Blisters on hands and arms  ? Adhesive [Tape] Itching and Rash  ?  Paper tape only- skin cannot tolerate "heavy" tapes  ? ? ?Current Outpatient Medications  ?Medication Sig Dispense Refill  ? aspirin EC 81 MG tablet Take 81 mg by mouth at bedtime.     ? atorvastatin (LIPITOR) 10 MG tablet Take 10 mg by mouth at bedtime.    ? buPROPion (WELLBUTRIN XL) 150 MG 24 hr tablet Take 150 mg by mouth in the morning.    ? docusate sodium (COLACE) 100 MG capsule Take 100 mg by mouth 2 (two) times daily.     ? escitalopram (LEXAPRO) 10 MG tablet Take 10 mg by mouth at bedtime.     ? fluticasone (FLONASE) 50 MCG/ACT nasal spray Place 1 spray into both nostrils daily as needed for allergies or rhinitis.    ?  lactulose, encephalopathy, (CHRONULAC) 10 GM/15ML SOLN Take 20 g by mouth daily.  4  ? levothyroxine (SYNTHROID, LEVOTHROID) 125 MCG tablet Take 125 mcg by mouth daily.     ? lidocaine-prilocaine (EMLA) cream Apply 1 application topically Every Tuesday,Thursday,and Saturday with dialysis.  4  ? linaclotide (LINZESS) 290 MCG CAPS capsule Take 290 mcg by mouth every evening.     ? loratadine (CLARITIN) 10 MG tablet Take 10 mg by mouth daily.     ? montelukast (SINGULAIR) 10 MG tablet Take 10 mg every evening by mouth.    ? nitroGLYCERIN (NITROSTAT) 0.4 MG SL tablet Place 0.4 mg under the tongue every 5 (five) minutes as needed for chest pain.    ? oxyCODONE-acetaminophen (PERCOCET) 10-325 MG tablet Take 1 tablet by mouth every 4 (four) hours as needed for pain.    ? polyethylene glycol (MIRALAX / GLYCOLAX) 17 g packet Take 17 g by mouth daily as needed for moderate constipation.    ? PROAIR HFA 108 (90 Base) MCG/ACT inhaler Inhale 2 puffs into the lungs every  6 (six) hours as needed for shortness of breath.    ? sevelamer carbonate (RENVELA) 800 MG tablet Take 3 tablets (2,400 mg total) by mouth 3 (three) times daily with meals. (Patient taking differently: Take 2,400 mg by m

## 2022-03-03 ENCOUNTER — Other Ambulatory Visit: Payer: Self-pay

## 2022-03-10 ENCOUNTER — Ambulatory Visit (HOSPITAL_COMMUNITY)
Admission: RE | Admit: 2022-03-10 | Discharge: 2022-03-10 | Disposition: A | Discharge status: Home / Self Care | Payer: Medicare Other | Attending: Vascular Surgery | Admitting: Vascular Surgery

## 2022-03-10 ENCOUNTER — Other Ambulatory Visit: Payer: Self-pay

## 2022-03-10 DIAGNOSIS — Z539 Procedure and treatment not carried out, unspecified reason: Secondary | ICD-10-CM | POA: Insufficient documentation

## 2022-03-10 LAB — POCT I-STAT, CHEM 8
BUN: 52 mg/dL — ABNORMAL HIGH (ref 8–23)
Calcium, Ion: 1.1 mmol/L — ABNORMAL LOW (ref 1.15–1.40)
Chloride: 95 mmol/L — ABNORMAL LOW (ref 98–111)
Creatinine, Ser: 9.7 mg/dL — ABNORMAL HIGH (ref 0.61–1.24)
Glucose, Bld: 145 mg/dL — ABNORMAL HIGH (ref 70–99)
HCT: 42 % (ref 39.0–52.0)
Hemoglobin: 14.3 g/dL (ref 13.0–17.0)
Potassium: 4.6 mmol/L (ref 3.5–5.1)
Sodium: 133 mmol/L — ABNORMAL LOW (ref 135–145)
TCO2: 30 mmol/L (ref 22–32)

## 2022-03-10 MED ORDER — SODIUM CHLORIDE 0.9% FLUSH: 3.0000 mL | INTRAVENOUS | Status: DC | PRN

## 2022-03-10 MED ORDER — SODIUM CHLORIDE 0.9 % IV SOLN: 250.0000 mL | INTRAVENOUS | Status: DC | PRN

## 2022-03-10 MED ORDER — SODIUM CHLORIDE 0.9% FLUSH
3.0000 mL | Freq: Two times a day (BID) | INTRAVENOUS | Status: DC
Start: 2022-03-10 — End: 2022-03-10

## 2022-03-17 ENCOUNTER — Ambulatory Visit (HOSPITAL_COMMUNITY)
Admission: RE | Admit: 2022-03-17 | Discharge: 2022-03-17 | Disposition: A | Discharge status: Home / Self Care | Payer: Medicare Other | Attending: Vascular Surgery | Admitting: Vascular Surgery

## 2022-03-17 ENCOUNTER — Other Ambulatory Visit: Payer: Self-pay

## 2022-03-17 ENCOUNTER — Encounter (HOSPITAL_COMMUNITY)
Admission: RE | Disposition: A | Discharge status: Home / Self Care | Payer: Self-pay | Source: Home / Self Care | Attending: Vascular Surgery

## 2022-03-17 DIAGNOSIS — Z992 Dependence on renal dialysis: Secondary | ICD-10-CM | POA: Insufficient documentation

## 2022-03-17 DIAGNOSIS — N186 End stage renal disease: Secondary | ICD-10-CM | POA: Insufficient documentation

## 2022-03-17 DIAGNOSIS — Z87891 Personal history of nicotine dependence: Secondary | ICD-10-CM | POA: Diagnosis not present

## 2022-03-17 DIAGNOSIS — Z7982 Long term (current) use of aspirin: Secondary | ICD-10-CM | POA: Insufficient documentation

## 2022-03-17 DIAGNOSIS — L97519 Non-pressure chronic ulcer of other part of right foot with unspecified severity: Secondary | ICD-10-CM | POA: Insufficient documentation

## 2022-03-17 DIAGNOSIS — I120 Hypertensive chronic kidney disease with stage 5 chronic kidney disease or end stage renal disease: Secondary | ICD-10-CM | POA: Insufficient documentation

## 2022-03-17 DIAGNOSIS — E1122 Type 2 diabetes mellitus with diabetic chronic kidney disease: Secondary | ICD-10-CM | POA: Insufficient documentation

## 2022-03-17 DIAGNOSIS — Z79899 Other long term (current) drug therapy: Secondary | ICD-10-CM | POA: Insufficient documentation

## 2022-03-17 DIAGNOSIS — I70235 Atherosclerosis of native arteries of right leg with ulceration of other part of foot: Secondary | ICD-10-CM

## 2022-03-17 HISTORY — PX: ABDOMINAL AORTOGRAM W/LOWER EXTREMITY: CATH118223

## 2022-03-17 LAB — POCT I-STAT, CHEM 8
BUN: 52 mg/dL — ABNORMAL HIGH (ref 8–23)
Calcium, Ion: 1.16 mmol/L (ref 1.15–1.40)
Chloride: 94 mmol/L — ABNORMAL LOW (ref 98–111)
Creatinine, Ser: 9.6 mg/dL — ABNORMAL HIGH (ref 0.61–1.24)
Glucose, Bld: 139 mg/dL — ABNORMAL HIGH (ref 70–99)
HCT: 41 % (ref 39.0–52.0)
Hemoglobin: 13.9 g/dL (ref 13.0–17.0)
Potassium: 4.6 mmol/L (ref 3.5–5.1)
Sodium: 135 mmol/L (ref 135–145)
TCO2: 31 mmol/L (ref 22–32)

## 2022-03-17 LAB — GLUCOSE, CAPILLARY: Glucose-Capillary: 112 mg/dL — ABNORMAL HIGH (ref 70–99)

## 2022-03-17 SURGERY — ABDOMINAL AORTOGRAM W/LOWER EXTREMITY
Anesthesia: LOCAL

## 2022-03-17 MED ORDER — SODIUM CHLORIDE 0.9 % IV SOLN
250.0000 mL | INTRAVENOUS | Status: DC | PRN
  Administered 2022-03-17: 250 mL via INTRAVENOUS

## 2022-03-17 MED ORDER — FENTANYL CITRATE (PF) 100 MCG/2ML IJ SOLN
INTRAMUSCULAR | Status: DC | PRN
  Administered 2022-03-17: 50 ug via INTRAVENOUS

## 2022-03-17 MED ORDER — LIDOCAINE HCL (PF) 1 % IJ SOLN
INTRAMUSCULAR | Status: AC
  Filled 2022-03-17: qty 30

## 2022-03-17 MED ORDER — HEPARIN (PORCINE) IN NACL 1000-0.9 UT/500ML-% IV SOLN
INTRAVENOUS | Status: AC
  Filled 2022-03-17: qty 1000

## 2022-03-17 MED ORDER — HEPARIN (PORCINE) IN NACL 1000-0.9 UT/500ML-% IV SOLN
INTRAVENOUS | Status: DC | PRN
  Administered 2022-03-17 (×2): 500 mL

## 2022-03-17 MED ORDER — LIDOCAINE HCL (PF) 1 % IJ SOLN
INTRAMUSCULAR | Status: DC | PRN
  Administered 2022-03-17: 20 mL

## 2022-03-17 MED ORDER — SODIUM CHLORIDE 0.9% FLUSH: 3.0000 mL | INTRAVENOUS | Status: DC | PRN

## 2022-03-17 MED ORDER — SODIUM CHLORIDE 0.9% FLUSH: 3.0000 mL | Freq: Two times a day (BID) | INTRAVENOUS | Status: DC

## 2022-03-17 MED ORDER — FENTANYL CITRATE (PF) 100 MCG/2ML IJ SOLN
INTRAMUSCULAR | Status: AC
  Filled 2022-03-17: qty 2

## 2022-03-17 MED ORDER — SODIUM CHLORIDE 0.9 % IV SOLN: 250.0000 mL | INTRAVENOUS | Status: DC | PRN

## 2022-03-17 MED ORDER — IODIXANOL 320 MG/ML IV SOLN
INTRAVENOUS | Status: DC | PRN
  Administered 2022-03-17: 140 mL via INTRA_ARTERIAL

## 2022-03-17 MED ORDER — MIDAZOLAM HCL 2 MG/2ML IJ SOLN
INTRAMUSCULAR | Status: AC
  Filled 2022-03-17: qty 2

## 2022-03-17 MED ORDER — MIDAZOLAM HCL 2 MG/2ML IJ SOLN
INTRAMUSCULAR | Status: DC | PRN
  Administered 2022-03-17: 1 mg via INTRAVENOUS

## 2022-03-17 MED ORDER — HYDRALAZINE HCL 20 MG/ML IJ SOLN: 5.0000 mg | INTRAMUSCULAR | Status: DC | PRN

## 2022-03-17 MED ORDER — ONDANSETRON HCL 4 MG/2ML IJ SOLN: 4.0000 mg | Freq: Four times a day (QID) | INTRAMUSCULAR | Status: DC | PRN

## 2022-03-17 SURGICAL SUPPLY — 15 items
CATH OMNI FLUSH 5F 65CM (CATHETERS) ×1 IMPLANT
CATH QUICKCROSS SUPP .035X90CM (MICROCATHETER) ×1 IMPLANT
CATH TEMPO AQUA 5F 100CM (CATHETERS) ×1 IMPLANT
CLOSURE MYNX CONTROL 5F (Vascular Products) ×1 IMPLANT
GUIDEWIRE ANGLED .035X150CM (WIRE) ×1 IMPLANT
KIT MICROPUNCTURE NIT STIFF (SHEATH) ×1 IMPLANT
KIT PV (KITS) ×2 IMPLANT
SHEATH MICROPUNCTURE PEDAL 4FR (SHEATH) ×1 IMPLANT
SHEATH PINNACLE 5F 10CM (SHEATH) ×1 IMPLANT
SHEATH PROBE COVER 6X72 (BAG) ×1 IMPLANT
SYR MEDRAD MARK V 150ML (SYRINGE) ×1 IMPLANT
TRANSDUCER W/STOPCOCK (MISCELLANEOUS) ×2 IMPLANT
TRAY PV CATH (CUSTOM PROCEDURE TRAY) ×2 IMPLANT
WIRE BENTSON .035X145CM (WIRE) ×1 IMPLANT
WIRE ROSEN-J .035X180CM (WIRE) ×1 IMPLANT

## 2022-03-17 NOTE — Op Note (Signed)
? ? ?Patient name: Christian Dalton MRN: [id number] DOB: 07-04-1941 Sex: male ? ?03/17/2022 ?Pre-operative Diagnosis: Chronic right lower extremity limb threatening ischemia with tissue loss ?Post-operative diagnosis:  Same ?Surgeon:  Eda Paschal. Donzetta Matters, MD ?Procedure Performed: ?1.  Ultrasound-guided cannulation left common femoral artery ?2.  Aortogram with bilateral lower extremity runoff ?3.  Selection of right popliteal artery and right below-knee angiogram ?4.  Ultrasound-guided cannulation right dorsalis pedis artery and retrograde right anterior tibial artery angiogram ?5.  Mynx device closure left common femoral artery ?6.  Moderate sedation with fentanyl and Versed for 47 minutes ? ?Indications: 81 year old male with history of bilateral lower extremity peripheral arterial disease with new ulceration of the right lateral foot around the pinky toe.  He has tissue loss with toe pressure less than 60 and he is now indicated for angiography with possible intervention. ? ?Wi-Fi: Small ulcer, toe pressure 59, noninfected (Wifi 2) ? ?Findings: Aorta and iliac segments are free of flow-limiting stenosis.  Bilateral renal arteries are patent however do not fill the kidneys consistent with need for dialysis.  Bilateral common and external iliac arteries and common femoral arteries are free of disease.  Bilateral SFAs are calcified but free of disease of the popliteal arteries.  Right-sided site of interest there is initially an anterior tibial takeoff and this occludes after approximately 2 cm.  There is a diminutive peroneal runoff to the ankle which occludes in the dominant runoff is via the posterior tibial artery to the ankle which then gives rise to dorsalis pedis.  After cannulating the dorsalis pedis performed retrograde angiography there is no discernible anterior tibial artery and no intervention was undertaken.  On the left side anterior tibial artery again occludes after a few centimeters.  He appears to have  dominant runoff via the peroneal artery to the ankle and a posterior tibial artery that goes at least to the ankle without any definitive flow into the foot on the left. ? ?Plan will be for continued wound care of the right foot but he will be at high risk for major amputation. ?  ?Procedure:  The patient was identified in the holding area and taken to room 8.  The patient was then placed supine on the table and prepped and draped in the usual sterile fashion.  A time out was called.  Ultrasound was used to evaluate the left common femoral artery which was patent and compressible.  The areas anesthetized with 1% lidocaine cannulated micropuncture needle followed by wire and sheath.  Images saved the permanent record.  Bentson wires placed followed by 5 [place] sheath.  Omni catheter was placed to the level of L1 aortogram was performed.  Bilateral lower extremity runoff was then obtained.  We then crossed the bifurcation required a Glidewire and a quick cross catheter followed by a Constance Holster wire.  With a quick cross down to the level the popliteal artery performed angiography below the knee.  With this I elected to cannulate the dorsalis pedis artery.  The foot was prepped and draped in usual fashion.  Ultrasound was used to identify the dorsalis pedis which was somewhat calcified although large and appeared patent.  The area was anesthetized with 1% lidocaine cannulated micropuncture needle followed by a micropuncture sheath.  I performed retrograde angiography.  With the findings I elected no intervention.  I removed the sheath from the dorsalis pedis and pressure was held.  The catheter was removed from the left groin and Mynx device was deployed.  He tolerated  procedure without any complication. ? ? ?Contrast: 140cc ? ?Breeanne Oblinger C. Donzetta Matters, MD ?[facility] ?Office: [phone] ?Pager: [phone] ? ? ?

## 2022-03-17 NOTE — Interval H&P Note (Signed)
History and Physical Interval Note: ? ?03/17/2022 ?10:55 AM ? ?Christian Dalton  has presented today for surgery, with the diagnosis of atherosclerosis of native artery of right leg with ulceration of foot.  The various methods of treatment have been discussed with the patient and family. After consideration of risks, benefits and other options for treatment, the patient has consented to  Procedure(s): ?ABDOMINAL AORTOGRAM W/LOWER EXTREMITY (N/A) as a surgical intervention.  The patient's history has been reviewed, patient examined, no change in status, stable for surgery.  I have reviewed the patient's chart and labs.  Questions were answered to the patient's satisfaction.   ? ? ?Servando Snare ? ? ?

## 2022-03-17 NOTE — Discharge Instructions (Addendum)
Femoral Site Care This sheet gives you information about how to care for yourself after your procedure. Your health care provider may also give you more specific instructions. If you have problems or questions, contact your health care provider. What can I expect after the procedure?  After the procedure, it is common to have: Bruising that usually fades within 1-2 weeks. Tenderness at the site. Follow these instructions at home: Wound care Follow instructions from your health care provider about how to take care of your insertion site. Make sure you: Wash your hands with soap and water before you change your bandage (dressing). If soap and water are not available, use hand sanitizer. Remove your dressing as told by your health care provider. In 24 hours Do not take baths, swim, or use a hot tub until your health care provider approves. You may shower 24-48 hours after the procedure or as told by your health care provider. Gently wash the site with plain soap and water. Pat the area dry with a clean towel. Do not rub the site. This may cause bleeding. Do not apply powder or lotion to the site. Keep the site clean and dry. Check your femoral site every day for signs of infection. Check for: Redness, swelling, or pain. Fluid or blood. Warmth. Pus or a bad smell. Activity For the first 2-3 days after your procedure, or as long as directed: Avoid climbing stairs as much as possible. Do not squat. Do not lift anything that is heavier than 10 lb (4.5 kg), or the limit that you are told, until your health care provider says that it is safe. For 5 days Rest as directed. Avoid sitting for a long time without moving. Get up to take short walks every 1-2 hours. Do not drive for 24 hours if you were given a medicine to help you relax (sedative). General instructions Take over-the-counter and prescription medicines only as told by your health care provider. Keep all follow-up visits as told by  your health care provider. This is important. Contact a health care provider if you have: A fever or chills. You have redness, swelling, or pain around your insertion site. Get help right away if: The catheter insertion area swells very fast. You pass out. You suddenly start to sweat or your skin gets clammy. The catheter insertion area is bleeding, and the bleeding does not stop when you hold steady pressure on the area. The area near or just beyond the catheter insertion site becomes pale, cool, tingly, or numb. These symptoms may represent a serious problem that is an emergency. Do not wait to see if the symptoms will go away. Get medical help right away. Call your local emergency services (911 in the [place] Do not drive yourself to the hospital. Summary After the procedure, it is common to have bruising that usually fades within 1-2 weeks. Check your femoral site every day for signs of infection. Do not lift anything that is heavier than 10 lb (4.5 kg), or the limit that you are told, until your health care provider says that it is safe. This information is not intended to replace advice given to you by your health care provider. Make sure you discuss any questions you have with your health care provider. Document Revised: 11/23/2017 Document Reviewed: 11/23/2017 Elsevier Patient Education  2020 Elsevier Inc[employer] 

## 2022-03-18 ENCOUNTER — Encounter (HOSPITAL_COMMUNITY): Payer: Self-pay | Admitting: Vascular Surgery

## 2022-05-12 ENCOUNTER — Other Ambulatory Visit: Payer: Self-pay | Admitting: *Deleted

## 2022-05-12 DIAGNOSIS — I739 Peripheral vascular disease, unspecified: Secondary | ICD-10-CM

## 2022-05-15 NOTE — Progress Notes (Signed)
Office Note     CC:  follow up Requesting Provider:  Maris Berger, MD  HPI: Christian Dalton is a 81 y.o. (1941/10/21) male who presents for follow up of PAD. He underwent Aortogram with bilateral lower extremity runoff on 03/17/22. This was performed due to new ulceration of the right lateral foot around the pinky toe. Additionally had a toe pressure less than 60 so was indicated for angiography with possible intervention. The findings are as follows:   Aorta and iliac segments are free of flow-limiting stenosis.  Bilateral renal arteries are patent however do not fill the kidneys consistent with need for dialysis.  Bilateral common and external iliac arteries and common femoral arteries are free of disease.  Bilateral SFAs are calcified but free of disease of the popliteal arteries.  Right-sided site of interest there is initially an anterior tibial takeoff and this occludes after approximately 2 cm.  There is a diminutive peroneal runoff to the ankle which occludes in the dominant runoff is via the posterior tibial artery to the ankle which then gives rise to dorsalis pedis.  After cannulating the dorsalis pedis performed retrograde angiography there is no discernible anterior tibial artery and no intervention was undertaken.  On the left side anterior tibial artery again occludes after a few centimeters. He appears to have dominant runoff via the peroneal artery to the ankle and a posterior tibial artery that goes at least to the ankle without any definitive flow into the foot on the left.   Unfortunately he had no revascularization options on the right. Aggressive local wound care was recommended as he was advised that he was high risk for needing amputation if his wounds were to progress.   He has been seeing podiatry for ulcer wound care every 2 weeks. He reports that he has no pain. He does not feel that wounds are getting any better. He now has new blister on dorsum of right foot and left  great toe had a blister and is now ulcerated. He has been keeping his wounds dressed with band aids. He denies any pain on ambulation. He uses cane to walk due to unsteady gate  He additionally reports that he saw his cardiologist today and is scheduled for TEE and Left and right heart Cath on 05/30/22.  He also has history of left BV AV fistula. This is functioning well. He currently dialyzes on Tues/Thurs/Sat  The pt is on a statin for cholesterol management.  The pt is on a daily aspirin.   Other AC:  None The pt is not on any medication for hypertension.   The pt is diabetic.   Tobacco hx:  Former  Past Medical History:  Diagnosis Date   Acute cholecystitis 08/17/2018   Anemia    Arthritis    Atrial fibrillation (HCC)    CAD (coronary artery disease)    Cholecystitis    COPD (chronic obstructive pulmonary disease) (HCC)    Depression    Diabetes mellitus without complication (HCC)    ESRD (end stage renal disease) on dialysis (HCC)    HOH (hard of hearing)    Hypertension    Hypothyroidism    OSA (obstructive sleep apnea)    does not wear cpap   Renal disorder    Spinal stenosis    Wears dentures    wears top dentures   Wears glasses    Wears hearing aid in both ears     Past Surgical History:  Procedure Laterality Date  ABDOMINAL AORTOGRAM N/A 01/15/2021   Procedure: ABDOMINAL AORTOGRAM;  Surgeon: Serafina Mitchell, MD;  Location: [facility];  Service: Cardiovascular;  Laterality: N/A;   ABDOMINAL AORTOGRAM W/LOWER EXTREMITY Bilateral 12/11/2020   Procedure: ABDOMINAL AORTOGRAM W/LOWER EXTREMITY;  Surgeon: Serafina Mitchell, MD;  Location: [facility];  Service: Cardiovascular;  Laterality: Bilateral;   ABDOMINAL AORTOGRAM W/LOWER EXTREMITY N/A 03/17/2022   Procedure: ABDOMINAL AORTOGRAM W/LOWER EXTREMITY;  Surgeon: Waynetta Sandy, MD;  Location: [facility];  Service: Cardiovascular;  Laterality: N/A;   BASCILIC VEIN TRANSPOSITION Left  12/02/2019   Procedure: BASCILIC VEIN TRANSPOSITION LEFT FIRST STAGE;  Surgeon: Serafina Mitchell, MD;  Location: [place];  Service: Vascular;  Laterality: Left;   [place] Left 02/10/2020   Procedure: BASCILIC VEIN TRANSPOSITION LEFT SECOND STAGE;  Surgeon: Serafina Mitchell, MD;  Location: [place];  Service: Vascular;  Laterality: Left;   BILIARY STENT PLACEMENT  11/25/2018   Procedure: [place];  Surgeon: [name], MD;  Location: [facility];  Service: Gastroenterology;;   BIOPSY  12/20/2018   Procedure: BIOPSY;  Surgeon: [name], MD;  Location: [facility];  Service: Gastroenterology;;   BIOPSY  08/03/2020   Procedure: BIOPSY;  Surgeon: Otis Brace, MD;  Location: [facility];  Service: Gastroenterology;;  EGD and COLON   CARDIAC CATHETERIZATION     CHOLECYSTECTOMY N/A 11/14/2018   Procedure: LAPAROSCOPIC CHOLECYSTECTOMY WITH INTRAOPERATIVE CHOLANGIOGRAM;  Surgeon: Coralie Keens, MD;  Location: [place];  Service: General;  Laterality: N/A;   COLONOSCOPY W/ BIOPSIES AND POLYPECTOMY     COLONOSCOPY WITH PROPOFOL N/A 08/03/2020   Procedure: COLONOSCOPY WITH PROPOFOL;  Surgeon: Otis Brace, MD;  Location: [facility];  Service: Gastroenterology;  Laterality: N/A;   ERCP N/A 11/25/2018   Procedure: ENDOSCOPIC RETROGRADE CHOLANGIOPANCREATOGRAPHY (ERCP);  Surgeon: [name], MD;  Location: [place];  Service: Gastroenterology;  Laterality: N/A;   ESOPHAGOGASTRODUODENOSCOPY N/A 12/20/2018   Procedure: ESOPHAGOGASTRODUODENOSCOPY (EGD);  Surgeon: [name], MD;  Location: [facility];  Service: Gastroenterology;  Laterality: N/A;   ESOPHAGOGASTRODUODENOSCOPY (EGD) WITH PROPOFOL N/A 08/03/2020   Procedure: ESOPHAGOGASTRODUODENOSCOPY (EGD) WITH PROPOFOL;  Surgeon: Otis Brace, MD;  Location: [facility];  Service: Gastroenterology;  Laterality: N/A;   EYE SURGERY     cataract B/L   HAND SURGERY Right    IR DIALY SHUNT INTRO NEEDLE/INTRACATH  INITIAL W/IMG LEFT Left 08/20/2018   IR PERC CHOLECYSTOSTOMY  08/19/2018   IR RADIOLOGIST EVAL & MGMT  09/29/2018   LOWER EXTREMITY ANGIOGRAPHY Left 01/15/2021   Procedure: Lower Extremity Angiography;  Surgeon: Serafina Mitchell, MD;  Location: [place] [facility];  Service: Cardiovascular;  Laterality: Left;   MULTIPLE TOOTH EXTRACTIONS     PERIPHERAL VASCULAR BALLOON ANGIOPLASTY Right 12/11/2020   Procedure: PERIPHERAL VASCULAR BALLOON ANGIOPLASTY;  Surgeon: Serafina Mitchell, MD;  Location: [facility];  Service: Cardiovascular;  Laterality: Right;   PERIPHERAL VASCULAR BALLOON ANGIOPLASTY Left 01/15/2021   Procedure: PERIPHERAL VASCULAR BALLOON ANGIOPLASTY;  Surgeon: Serafina Mitchell, MD;  Location: [facility];  Service: Cardiovascular;  Laterality: Left;  PT   POLYPECTOMY  08/03/2020   Procedure: POLYPECTOMY;  Surgeon: Otis Brace, MD;  Location: [facility];  Service: Gastroenterology;;   RESECTION OF ARTERIOVENOUS FISTULA ANEURYSM Left 10/27/2019   Procedure: Resection Of Left Upper Arm  Arteriovenous Fistula Aneurysm Times Two;  Surgeon: Serafina Mitchell, MD;  Location: [place];  Service: Vascular;  Laterality: Left;   SHOULDER SURGERY Right  SPHINCTEROTOMY  11/25/2018   Procedure: SPHINCTEROTOMY;  Surgeon: [name], MD;  Location: [place];  Service: Gastroenterology;;   STENT REMOVAL  12/20/2018   Procedure: STENT REMOVAL;  Surgeon: [name], MD;  Location: [facility];  Service: Gastroenterology;;   TIBIA FRACTURE SURGERY      Social History   Socioeconomic History   Marital status: Married    Spouse name: Di Kindle   Number of children: Not on file   Years of education: Not on file   Highest education level: Not on file  Occupational History   Not on file  Tobacco Use   Smoking status: Never   Smokeless tobacco: Former    Types: Chew  Vaping Use   Vaping Use: Never used  Substance and Sexual Activity   Alcohol use: Not Currently   Drug use: No    Sexual activity: Not on file  Other Topics Concern   Not on file  Social History Narrative   Not on file   Social Determinants of Health   Financial Resource Strain: Low Risk  (08/17/2018)   Overall Financial Resource Strain (CARDIA)    Difficulty of Paying Living Expenses: Not hard at all  Food Insecurity: Unknown (08/17/2018)   Hunger Vital Sign    Worried About [place] in the Last Year: Patient refused    [place] in the Last Year: Patient refused  Transportation Needs: Unknown (08/17/2018)   [place] - Transportation    Lack of Transportation (Medical): Patient refused    Lack of Transportation (Non-Medical): Patient refused  Physical Activity: Unknown (08/17/2018)   Exercise Vital Sign    Days of Exercise per Week: Patient refused    Minutes of Exercise per Session: Patient refused  Stress: No Stress Concern Present (08/17/2018)   [employer] [place]    Feeling of Stress : Only a little  Social Connections: Not on file  Intimate Partner Violence: Not on file    Family History  Problem Relation Age of Onset   Heart disease Mother    Heart disease Father    Heart disease Sister     Current Outpatient Medications  Medication Sig Dispense Refill   aspirin EC 81 MG tablet Take 81 mg by mouth at bedtime.      atorvastatin (LIPITOR) 10 MG tablet Take 10 mg by mouth at bedtime.     buPROPion (WELLBUTRIN XL) 150 MG 24 hr tablet Take 150 mg by mouth every evening.     docusate sodium (COLACE) 100 MG capsule Take 100 mg by mouth 2 (two) times daily.      escitalopram (LEXAPRO) 10 MG tablet Take 10 mg by mouth at bedtime.      fluticasone (FLONASE) 50 MCG/ACT nasal spray Place 1 spray into both nostrils daily as needed for allergies or rhinitis.     lactulose, encephalopathy, (CHRONULAC) 10 GM/15ML SOLN Take 10 g by mouth every evening.  4   levothyroxine (SYNTHROID, LEVOTHROID) 125 MCG tablet Take 125 mcg by  mouth daily before breakfast.     lidocaine-prilocaine (EMLA) cream Apply 1 application topically Every Tuesday,Thursday,and Saturday with dialysis.  4   linaclotide (LINZESS) 290 MCG CAPS capsule Take 290 mcg by mouth every evening.      loratadine (CLARITIN) 10 MG tablet Take 10 mg by mouth daily.      montelukast (SINGULAIR) 10 MG tablet Take 10 mg every evening by mouth.     nitroGLYCERIN (NITROSTAT) 0.4  MG SL tablet Place 0.4 mg under the tongue every 5 (five) minutes as needed for chest pain.     oxyCODONE-acetaminophen (PERCOCET) 10-325 MG tablet Take 1 tablet by mouth every 4 (four) hours as needed for pain.     PROAIR HFA 108 (90 Base) MCG/ACT inhaler Inhale 2 puffs into the lungs every 6 (six) hours as needed for shortness of breath.     Propylene Glycol (SYSTANE BALANCE) 0.6 % SOLN Place 1 drop into both eyes as needed (dry eyes).     sevelamer carbonate (RENVELA) 800 MG tablet Take 3 tablets (2,400 mg total) by mouth 3 (three) times daily with meals. (Patient taking differently: Take 2,400 mg by mouth 2 (two) times daily with a meal.) 45 tablet 0   No current facility-administered medications for this visit.    Allergies  Allergen Reactions   Avelox [Moxifloxacin Hcl] Other (See Comments)    Hallucinations    Gabapentin Other (See Comments)    Caused staggering and red feet   Metoprolol Other (See Comments)    Dropped pulse too low; was taken off of this by MD   Quinolones Other (See Comments)    unknown   Shellfish Allergy Nausea And Vomiting and Other (See Comments)    Made patient VERY nauseous and he developed stomach cramps   Sulfa Antibiotics Other (See Comments)    Allergy is from childhood (reaction not recalled)   Tetracyclines & Related Other (See Comments)    Blisters on hands and arms   Adhesive [Tape] Itching and Rash    Paper tape only- skin cannot tolerate "heavy" tapes     REVIEW OF SYSTEMS:   '[X]'$  denotes positive finding, '[ ]'$  denotes negative  finding Cardiac  Comments:  Chest pain or chest pressure:    Shortness of breath upon exertion:    Short of breath when lying flat:    Irregular heart rhythm:        Vascular    Pain in calf, thigh, or hip brought on by ambulation:    Pain in feet at night that wakes you up from your sleep:     Blood clot in your veins:    Leg swelling:         Pulmonary    Oxygen at home:    Productive cough:     Wheezing:         Neurologic    Sudden weakness in arms or legs:     Sudden numbness in arms or legs:     Sudden onset of difficulty speaking or slurred speech:    Temporary loss of vision in one eye:     Problems with dizziness:         Gastrointestinal    Blood in stool:     Vomited blood:         Genitourinary    Burning when urinating:     Blood in urine:        Psychiatric    Major depression:         Hematologic    Bleeding problems:    Problems with blood clotting too easily:        Skin    Rashes or ulcers:        Constitutional    Fever or chills:      PHYSICAL EXAMINATION:  Vitals:   05/21/22 1312  BP: 124/70  Pulse: 89  Resp: 18  Temp: (!) 97.4 F (36.3 C)  TempSrc: Temporal  SpO2: 100%  Weight: 203 lb 4.8 oz (92.2 kg)  Height: '5\' 10"'$  (1.778 m)    General:  WDWN in NAD; vital signs documented above Gait: ambulates with cane HENT: WNL, normocephalic Pulmonary: normal non-labored breathing , without wheezing Cardiac: regular HR, without  Murmurs without carotid bruit Abdomen: distended, soft Vascular Exam/Pulses:  Right Left  Radial 2+ (normal) 2+ (normal)  Femoral 2+ (normal) 2+ (normal)  Popliteal absent absent  DP absent absent  PT absent absent   Extremities: with ischemic changes, without Gangrene , without cellulitis; with open wounds;   Right lateral foot wounds and bullous lesion on dorsum of foot  Right medial foot superficial skin wound   Left great toe ulceration  Small ulceration between left 4th and 5th toes, on dorsum  of 3rd toe( dry eschar) Musculoskeletal: no muscle wasting or atrophy  Neurologic: A&O X 3;  No focal weakness or paresthesias are detected Psychiatric:  The pt has Normal affect.   Non-Invasive Vascular Imaging:   +-------+-----------+-----------+------------+------------+  ABI/TBIToday's ABIToday's TBIPrevious ABIPrevious TBI  +-------+-----------+-----------+------------+------------+  Right  [place]         0.23                                 +-------+-----------+-----------+------------+------------+  Left   [place]         0.16                                 +-------+-----------+-----------+------------+------------+  Right toe pressure of 31 mmHg Left toe pressure of 21 mmHg  ASSESSMENT/PLAN:: 81 y.o. male here for follow up for PAD. He recently underwent angiogram BLE. No intervention was taken as he had no revascularization options on RLE. His wound remains unhealed and he has new ulceration on left great toe. He will continue to follow up with podiatry for wound care. His ABI today shows non compressible vessels with severe tibial disease. His toe pressures put him at very high risk for wound healing - Discussed case with Dr. Donzetta Matters who reviewed Angiogram images and also saw patient and recommends attempt at intervention on LLE - Should his wounds progress he will likely need more proximal amputation as he has severe tibial disease bilaterally - Continue Aspirin and statin - discussed importance of keeping his feet protected. He can paint wounds with Betadine - He has a cardiac cath scheduled for 7/7 so will arrange for his Angiogram after this procedure - Will schedule him for Aortogram, Arteriogram BLE with possible intervention LLE by Dr. Donzetta Matters. He dialyzes on Tues/Thurs/ Sat so will try to arrange this on a non dialysis day   Karoline Caldwell, PA-C [facility] [phone]  Clinic MD:   Yetta Barre

## 2022-05-21 ENCOUNTER — Ambulatory Visit (HOSPITAL_COMMUNITY)
Admission: RE | Admit: 2022-05-21 | Discharge: 2022-05-21 | Disposition: A | Discharge status: Home / Self Care | Payer: Medicare Other | Source: Ambulatory Visit | Attending: Vascular Surgery | Admitting: Vascular Surgery

## 2022-05-21 ENCOUNTER — Ambulatory Visit (INDEPENDENT_AMBULATORY_CARE_PROVIDER_SITE_OTHER): Payer: Medicare Other | Admitting: Physician Assistant

## 2022-05-21 VITALS — BP 124/70 | HR 89 | Temp 97.4°F | Resp 18 | Ht 70.0 in | Wt 203.3 lb

## 2022-05-21 DIAGNOSIS — I739 Peripheral vascular disease, unspecified: Secondary | ICD-10-CM | POA: Diagnosis present

## 2022-05-21 DIAGNOSIS — I70234 Atherosclerosis of native arteries of right leg with ulceration of heel and midfoot: Secondary | ICD-10-CM | POA: Diagnosis not present

## 2022-05-21 DIAGNOSIS — Z992 Dependence on renal dialysis: Secondary | ICD-10-CM

## 2022-05-21 DIAGNOSIS — N186 End stage renal disease: Secondary | ICD-10-CM

## 2022-05-28 ENCOUNTER — Other Ambulatory Visit: Payer: Self-pay | Admitting: *Deleted

## 2022-05-28 ENCOUNTER — Other Ambulatory Visit: Payer: Self-pay

## 2022-05-28 DIAGNOSIS — I70234 Atherosclerosis of native arteries of right leg with ulceration of heel and midfoot: Secondary | ICD-10-CM

## 2022-05-28 NOTE — Progress Notes (Signed)
error 

## 2022-05-30 ENCOUNTER — Inpatient Hospital Stay (HOSPITAL_COMMUNITY): Payer: Medicare Other

## 2022-05-30 ENCOUNTER — Encounter (HOSPITAL_COMMUNITY): Payer: Self-pay

## 2022-05-30 ENCOUNTER — Emergency Department (HOSPITAL_COMMUNITY): Payer: Medicare Other

## 2022-05-30 ENCOUNTER — Other Ambulatory Visit: Payer: Self-pay

## 2022-05-30 ENCOUNTER — Inpatient Hospital Stay (HOSPITAL_COMMUNITY)
Admission: EM | Admit: 2022-05-30 | Discharge: 2022-06-05 | DRG: 673 | Disposition: A | Discharge status: Home Health | Payer: Medicare Other | Attending: Family Medicine | Admitting: Family Medicine

## 2022-05-30 DIAGNOSIS — T80818A Extravasation of other vesicant agent, initial encounter: Secondary | ICD-10-CM | POA: Diagnosis present

## 2022-05-30 DIAGNOSIS — S37092A Other injury of left kidney, initial encounter: Secondary | ICD-10-CM

## 2022-05-30 DIAGNOSIS — I480 Paroxysmal atrial fibrillation: Secondary | ICD-10-CM | POA: Diagnosis present

## 2022-05-30 DIAGNOSIS — L97929 Non-pressure chronic ulcer of unspecified part of left lower leg with unspecified severity: Secondary | ICD-10-CM | POA: Diagnosis present

## 2022-05-30 DIAGNOSIS — Z79890 Hormone replacement therapy (postmenopausal): Secondary | ICD-10-CM

## 2022-05-30 DIAGNOSIS — Z79899 Other long term (current) drug therapy: Secondary | ICD-10-CM

## 2022-05-30 DIAGNOSIS — Z8249 Family history of ischemic heart disease and other diseases of the circulatory system: Secondary | ICD-10-CM

## 2022-05-30 DIAGNOSIS — E039 Hypothyroidism, unspecified: Secondary | ICD-10-CM | POA: Diagnosis present

## 2022-05-30 DIAGNOSIS — Z882 Allergy status to sulfonamides status: Secondary | ICD-10-CM

## 2022-05-30 DIAGNOSIS — N186 End stage renal disease: Secondary | ICD-10-CM | POA: Diagnosis present

## 2022-05-30 DIAGNOSIS — D696 Thrombocytopenia, unspecified: Secondary | ICD-10-CM

## 2022-05-30 DIAGNOSIS — Z888 Allergy status to other drugs, medicaments and biological substances status: Secondary | ICD-10-CM

## 2022-05-30 DIAGNOSIS — I2510 Atherosclerotic heart disease of native coronary artery without angina pectoris: Secondary | ICD-10-CM | POA: Diagnosis present

## 2022-05-30 DIAGNOSIS — Z881 Allergy status to other antibiotic agents status: Secondary | ICD-10-CM

## 2022-05-30 DIAGNOSIS — I5022 Chronic systolic (congestive) heart failure: Secondary | ICD-10-CM | POA: Diagnosis present

## 2022-05-30 DIAGNOSIS — D6959 Other secondary thrombocytopenia: Secondary | ICD-10-CM | POA: Diagnosis present

## 2022-05-30 DIAGNOSIS — G9341 Metabolic encephalopathy: Secondary | ICD-10-CM | POA: Diagnosis not present

## 2022-05-30 DIAGNOSIS — E1122 Type 2 diabetes mellitus with diabetic chronic kidney disease: Secondary | ICD-10-CM | POA: Diagnosis present

## 2022-05-30 DIAGNOSIS — I5020 Unspecified systolic (congestive) heart failure: Secondary | ICD-10-CM

## 2022-05-30 DIAGNOSIS — Z91048 Other nonmedicinal substance allergy status: Secondary | ICD-10-CM

## 2022-05-30 DIAGNOSIS — E1152 Type 2 diabetes mellitus with diabetic peripheral angiopathy with gangrene: Secondary | ICD-10-CM | POA: Diagnosis present

## 2022-05-30 DIAGNOSIS — E871 Hypo-osmolality and hyponatremia: Secondary | ICD-10-CM | POA: Diagnosis present

## 2022-05-30 DIAGNOSIS — I9589 Other hypotension: Secondary | ICD-10-CM | POA: Diagnosis not present

## 2022-05-30 DIAGNOSIS — S37012A Minor contusion of left kidney, initial encounter: Principal | ICD-10-CM | POA: Diagnosis present

## 2022-05-30 DIAGNOSIS — D631 Anemia in chronic kidney disease: Secondary | ICD-10-CM | POA: Diagnosis present

## 2022-05-30 DIAGNOSIS — G4733 Obstructive sleep apnea (adult) (pediatric): Secondary | ICD-10-CM | POA: Diagnosis present

## 2022-05-30 DIAGNOSIS — Z87891 Personal history of nicotine dependence: Secondary | ICD-10-CM

## 2022-05-30 DIAGNOSIS — E875 Hyperkalemia: Secondary | ICD-10-CM | POA: Diagnosis present

## 2022-05-30 DIAGNOSIS — Z992 Dependence on renal dialysis: Secondary | ICD-10-CM | POA: Diagnosis not present

## 2022-05-30 DIAGNOSIS — M898X9 Other specified disorders of bone, unspecified site: Secondary | ICD-10-CM | POA: Diagnosis present

## 2022-05-30 DIAGNOSIS — E1165 Type 2 diabetes mellitus with hyperglycemia: Secondary | ICD-10-CM | POA: Diagnosis present

## 2022-05-30 DIAGNOSIS — I132 Hypertensive heart and chronic kidney disease with heart failure and with stage 5 chronic kidney disease, or end stage renal disease: Secondary | ICD-10-CM | POA: Diagnosis present

## 2022-05-30 DIAGNOSIS — Z6829 Body mass index (BMI) 29.0-29.9, adult: Secondary | ICD-10-CM

## 2022-05-30 DIAGNOSIS — R000 Tachycardia, unspecified: Secondary | ICD-10-CM | POA: Diagnosis present

## 2022-05-30 DIAGNOSIS — Y92009 Unspecified place in unspecified non-institutional (private) residence as the place of occurrence of the external cause: Secondary | ICD-10-CM

## 2022-05-30 DIAGNOSIS — E861 Hypovolemia: Secondary | ICD-10-CM | POA: Diagnosis not present

## 2022-05-30 DIAGNOSIS — I340 Nonrheumatic mitral (valve) insufficiency: Secondary | ICD-10-CM | POA: Diagnosis present

## 2022-05-30 DIAGNOSIS — S50812A Abrasion of left forearm, initial encounter: Secondary | ICD-10-CM | POA: Diagnosis present

## 2022-05-30 DIAGNOSIS — R0602 Shortness of breath: Secondary | ICD-10-CM

## 2022-05-30 DIAGNOSIS — E785 Hyperlipidemia, unspecified: Secondary | ICD-10-CM | POA: Diagnosis present

## 2022-05-30 DIAGNOSIS — J449 Chronic obstructive pulmonary disease, unspecified: Secondary | ICD-10-CM | POA: Diagnosis present

## 2022-05-30 DIAGNOSIS — D62 Acute posthemorrhagic anemia: Secondary | ICD-10-CM | POA: Diagnosis present

## 2022-05-30 DIAGNOSIS — E440 Moderate protein-calorie malnutrition: Secondary | ICD-10-CM | POA: Diagnosis present

## 2022-05-30 DIAGNOSIS — I96 Gangrene, not elsewhere classified: Secondary | ICD-10-CM

## 2022-05-30 DIAGNOSIS — F32A Depression, unspecified: Secondary | ICD-10-CM | POA: Diagnosis present

## 2022-05-30 DIAGNOSIS — W010XXA Fall on same level from slipping, tripping and stumbling without subsequent striking against object, initial encounter: Secondary | ICD-10-CM | POA: Diagnosis present

## 2022-05-30 DIAGNOSIS — I739 Peripheral vascular disease, unspecified: Secondary | ICD-10-CM

## 2022-05-30 DIAGNOSIS — R188 Other ascites: Secondary | ICD-10-CM | POA: Diagnosis present

## 2022-05-30 DIAGNOSIS — W19XXXA Unspecified fall, initial encounter: Secondary | ICD-10-CM

## 2022-05-30 DIAGNOSIS — E11628 Type 2 diabetes mellitus with other skin complications: Secondary | ICD-10-CM | POA: Diagnosis present

## 2022-05-30 DIAGNOSIS — Z7982 Long term (current) use of aspirin: Secondary | ICD-10-CM

## 2022-05-30 DIAGNOSIS — E1129 Type 2 diabetes mellitus with other diabetic kidney complication: Secondary | ICD-10-CM | POA: Diagnosis present

## 2022-05-30 HISTORY — PX: IR US GUIDE VASC ACCESS RIGHT: IMG2390

## 2022-05-30 HISTORY — PX: IR ANGIOGRAM SELECTIVE EACH ADDITIONAL VESSEL: IMG667

## 2022-05-30 HISTORY — PX: IR ANGIOGRAM VISCERAL SELECTIVE: IMG657

## 2022-05-30 HISTORY — PX: IR EMBO ART  VEN HEMORR LYMPH EXTRAV  INC GUIDE ROADMAPPING: IMG5450

## 2022-05-30 LAB — I-STAT CHEM 8, ED
BUN: 41 mg/dL — ABNORMAL HIGH (ref 8–23)
Calcium, Ion: 1.06 mmol/L — ABNORMAL LOW (ref 1.15–1.40)
Chloride: 94 mmol/L — ABNORMAL LOW (ref 98–111)
Creatinine, Ser: 9 mg/dL — ABNORMAL HIGH (ref 0.61–1.24)
Glucose, Bld: 255 mg/dL — ABNORMAL HIGH (ref 70–99)
HCT: 24 % — ABNORMAL LOW (ref 39.0–52.0)
Hemoglobin: 8.2 g/dL — ABNORMAL LOW (ref 13.0–17.0)
Potassium: 5.2 mmol/L — ABNORMAL HIGH (ref 3.5–5.1)
Sodium: 133 mmol/L — ABNORMAL LOW (ref 135–145)
TCO2: 22 mmol/L (ref 22–32)

## 2022-05-30 LAB — CBC WITH DIFFERENTIAL/PLATELET
Abs Immature Granulocytes: 0.04 10*3/uL (ref 0.00–0.07)
Basophils Absolute: 0 10*3/uL (ref 0.0–0.1)
Basophils Relative: 0 %
Eosinophils Absolute: 0 10*3/uL (ref 0.0–0.5)
Eosinophils Relative: 0 %
HCT: 24.7 % — ABNORMAL LOW (ref 39.0–52.0)
Hemoglobin: 7.5 g/dL — ABNORMAL LOW (ref 13.0–17.0)
Immature Granulocytes: 0 %
Lymphocytes Relative: 6 %
Lymphs Abs: 0.5 10*3/uL — ABNORMAL LOW (ref 0.7–4.0)
MCH: 29.5 pg (ref 26.0–34.0)
MCHC: 30.4 g/dL (ref 30.0–36.0)
MCV: 97.2 fL (ref 80.0–100.0)
Monocytes Absolute: 0.8 10*3/uL (ref 0.1–1.0)
Monocytes Relative: 8 %
Neutro Abs: 7.8 10*3/uL — ABNORMAL HIGH (ref 1.7–7.7)
Neutrophils Relative %: 86 %
Platelets: 225 10*3/uL (ref 150–400)
RBC: 2.54 MIL/uL — ABNORMAL LOW (ref 4.22–5.81)
RDW: 14.8 % (ref 11.5–15.5)
WBC: 9.1 10*3/uL (ref 4.0–10.5)
nRBC: 0 % (ref 0.0–0.2)

## 2022-05-30 LAB — COMPREHENSIVE METABOLIC PANEL
ALT: 9 U/L (ref 0–44)
AST: 12 U/L — ABNORMAL LOW (ref 15–41)
Albumin: 2.7 g/dL — ABNORMAL LOW (ref 3.5–5.0)
Alkaline Phosphatase: 70 U/L (ref 38–126)
Anion gap: 22 — ABNORMAL HIGH (ref 5–15)
BUN: 46 mg/dL — ABNORMAL HIGH (ref 8–23)
CO2: 20 mmol/L — ABNORMAL LOW (ref 22–32)
Calcium: 8.7 mg/dL — ABNORMAL LOW (ref 8.9–10.3)
Chloride: 94 mmol/L — ABNORMAL LOW (ref 98–111)
Creatinine, Ser: 8.61 mg/dL — ABNORMAL HIGH (ref 0.61–1.24)
GFR, Estimated: 6 mL/min — ABNORMAL LOW (ref >60)
Glucose, Bld: 265 mg/dL — ABNORMAL HIGH (ref 70–99)
Potassium: 5.3 mmol/L — ABNORMAL HIGH (ref 3.5–5.1)
Sodium: 136 mmol/L (ref 135–145)
Total Bilirubin: 0.6 mg/dL (ref 0.3–1.2)
Total Protein: 4.9 g/dL — ABNORMAL LOW (ref 6.5–8.1)

## 2022-05-30 LAB — PROTIME-INR
INR: 1.4 — ABNORMAL HIGH (ref 0.8–1.2)
Prothrombin Time: 17.1 seconds — ABNORMAL HIGH (ref 11.4–15.2)

## 2022-05-30 LAB — GLUCOSE, CAPILLARY
Glucose-Capillary: 183 mg/dL — ABNORMAL HIGH (ref 70–99)
Glucose-Capillary: 197 mg/dL — ABNORMAL HIGH (ref 70–99)
Glucose-Capillary: 190 mg/dL — ABNORMAL HIGH (ref 70–99)

## 2022-05-30 LAB — PREPARE RBC (CROSSMATCH)

## 2022-05-30 LAB — LIPASE, BLOOD: Lipase: 18 U/L (ref 11–51)

## 2022-05-30 LAB — MRSA NEXT GEN BY PCR, NASAL: MRSA by PCR Next Gen: NOT DETECTED

## 2022-05-30 LAB — APTT: aPTT: 36 seconds (ref 24–36)

## 2022-05-30 LAB — POC OCCULT BLOOD, ED: Fecal Occult Bld: POSITIVE — AB

## 2022-05-30 LAB — LACTIC ACID, PLASMA: Lactic Acid, Venous: 5.5 mmol/L (ref 0.5–1.9)

## 2022-05-30 MED ORDER — SODIUM CHLORIDE 0.9 % IV SOLN: INTRAVENOUS | Status: DC

## 2022-05-30 MED ORDER — ORAL CARE MOUTH RINSE: 15.0000 mL | OROMUCOSAL | Status: DC | PRN

## 2022-05-30 MED ORDER — SODIUM ZIRCONIUM CYCLOSILICATE 10 G PO PACK
10.0000 g | PACK | Freq: Every day | ORAL | Status: DC
  Administered 2022-05-30 – 2022-06-05 (×6): 10 g via ORAL
  Filled 2022-05-30 (×6): qty 1

## 2022-05-30 MED ORDER — ORAL CARE MOUTH RINSE
15.0000 mL | OROMUCOSAL | Status: DC
Start: 2022-05-30 — End: 2022-05-30
  Administered 2022-05-30: 15 mL via OROMUCOSAL

## 2022-05-30 MED ORDER — SODIUM CHLORIDE 0.9% IV SOLUTION: Freq: Once | INTRAVENOUS | Status: AC

## 2022-05-30 MED ORDER — CHLORHEXIDINE GLUCONATE CLOTH 2 % EX PADS
6.0000 | MEDICATED_PAD | Freq: Every day | CUTANEOUS | Status: DC
Start: 2022-05-31 — End: 2022-06-02
  Administered 2022-05-31 – 2022-06-01 (×2): 6 via TOPICAL

## 2022-05-30 MED ORDER — FENTANYL CITRATE (PF) 100 MCG/2ML IJ SOLN
INTRAMUSCULAR | Status: AC
  Filled 2022-05-30: qty 2

## 2022-05-30 MED ORDER — CHLORHEXIDINE GLUCONATE CLOTH 2 % EX PADS
6.0000 | MEDICATED_PAD | Freq: Every day | CUTANEOUS | Status: DC
  Administered 2022-05-30 – 2022-05-31 (×2): 6 via TOPICAL

## 2022-05-30 MED ORDER — MIDAZOLAM HCL 2 MG/2ML IJ SOLN
INTRAMUSCULAR | Status: AC | PRN
  Administered 2022-05-30 (×2): .5 mg via INTRAVENOUS

## 2022-05-30 MED ORDER — IOHEXOL 350 MG/ML SOLN
100.0000 mL | Freq: Once | INTRAVENOUS | Status: AC | PRN
  Administered 2022-05-30: 100 mL via INTRAVENOUS

## 2022-05-30 MED ORDER — ONDANSETRON HCL 4 MG/2ML IJ SOLN: 4.0000 mg | Freq: Four times a day (QID) | INTRAMUSCULAR | Status: DC | PRN

## 2022-05-30 MED ORDER — MIDAZOLAM HCL 2 MG/2ML IJ SOLN
INTRAMUSCULAR | Status: AC
  Filled 2022-05-30: qty 2

## 2022-05-30 MED ORDER — SODIUM CHLORIDE 0.9 % IV SOLN: 10.0000 mL/h | Freq: Once | INTRAVENOUS | Status: DC

## 2022-05-30 MED ORDER — IOHEXOL 300 MG/ML  SOLN
100.0000 mL | Freq: Once | INTRAMUSCULAR | Status: AC | PRN
  Administered 2022-05-30: 40 mL via INTRA_ARTERIAL

## 2022-05-30 MED ORDER — INSULIN ASPART 100 UNIT/ML IJ SOLN
0.0000 [IU] | INTRAMUSCULAR | Status: DC
  Administered 2022-05-30 – 2022-06-04 (×10): 1 [IU] via SUBCUTANEOUS

## 2022-05-30 MED ORDER — LACTATED RINGERS IV BOLUS
1000.0000 mL | Freq: Once | INTRAVENOUS | Status: AC
  Administered 2022-05-30: 1000 mL via INTRAVENOUS

## 2022-05-30 MED ORDER — LIDOCAINE HCL 1 % IJ SOLN
INTRAMUSCULAR | Status: AC
  Filled 2022-05-30: qty 20

## 2022-05-30 MED ORDER — FENTANYL CITRATE (PF) 100 MCG/2ML IJ SOLN
INTRAMUSCULAR | Status: AC | PRN
  Administered 2022-05-30 (×4): 25 ug via INTRAVENOUS

## 2022-05-30 NOTE — Procedures (Signed)
Pre-procedure Diagnosis: Left renal hemorrhage Post-procedure Diagnosis: Same  Post left renal arteriogram and subselective embolization of two subsegmental branches of the left renal artery supplying two discrete areas of active extravasation.    Complications: None Immediate EBL: None  Keep right leg straight for 4 hrs (until 1930).    Signed: Sandi Mariscal Pager: [phone] 05/30/2022, 4:44 PM

## 2022-05-30 NOTE — ED Triage Notes (Signed)
Coming from home fell yesterday landed on left side c/o of abdominal pain and tenderness. Cancelled heart cath for leaking valve that was today bc started vomiting last night after the fall  BP 70's. HD T,TH,Saturdays.   Cbg 327 74/50

## 2022-05-30 NOTE — H&P (Signed)
NAME:  Christian Dalton, MRN:  [id number], DOB:  06-Dec-1940, LOS: 0 ADMISSION DATE:  05/30/2022, CONSULTATION DATE:  7/7  REFERRING MD: Lavonna Rua, PA-C , CHIEF COMPLAINT:  Mechanical Fall, Abd Pain    History of Present Illness:  81 y/o M who presented to [facility] ER on 7/7 with reports of mechanical fall.    The patients wife reports he is followed by Dr. Donzetta Matters for PAD and is pending work up for possible amputation on LLE (great toe gangrene).  He was pending a TEE and LHC on 7/7 at [facility] but was canceled as his wife noted he did not feel well after HD on 7/6.  She reports he went to HD as usual on 7/6 and noted he felt bad after.  He tried to get out of the car after HD and fell on his left side.  She notes he didn't tell her until day of admit that he was feeling dizzy. She reports his blood pressure normally runs 1-teens to 094'B systolic and is not on antihypertensives.  When the patient fell, he struck his left side, left knee, and arm.  He reported back and flank pain overnight into 7/7 prompting evaluation in the ER.  Initial work up notable for CTA Chest, ABD, pelvis which showed a markedly large left peri-nephric / subcapsular hematoma involving the left kidney, there are adjacent foci of ill-defined increased attenuation at the margin of the kidney and the hematoma raising concern for active or ongoing bleeding.  Hemorrhage extends into the retroperitoneal tissues inferiorly to the extraperitoneal left pelvic sidewall. Trauma, IR and Urology were consulted with recommendations of angio-embolization of the left kidney.  Note Hgb 13.9 in April, on admit 7.5.  He was given 1L LR in ER, 3 units PRBC ordered for resuscitation.   PCCM consulted for admission.   Pertinent  Medical History  CAD  AF  ESRD on HD (T/Th/S) COPD  OSA  Hypothyroidism  Depression  Anemia DM Wears Hearing Aid + Glasses PAD - followed by Dr. Donzetta Matters, known BLE wounds, pending eval for possible amputation   Significant  Hospital Events: Including procedures, antibiotic start and stop dates in addition to other pertinent events   7/7 Admit after fall on 7/6 with abd pain. Found to have large perinephric hematoma, Hgb down to 7.5  Interim History / Subjective:  Pt reports back and abdominal pain  Objective   Blood pressure (!) 77/50, pulse 100, temperature 98.3 F (36.8 C), temperature source Oral, resp. rate 12, height '5\' 10"'$  (1.778 m), weight 92 kg, SpO2 100 %.       No intake or output data in the 24 hours ending 05/30/22 1330 Filed Weights   05/30/22 1020  Weight: 92 kg    Examination: General: elderly adult male lying in bed in NAD, wife at bedside  HENT: MM pale/moist, pupils 83m reactive, anicteric  Lungs: non-labored at rest, lungs bilaterally clear  Cardiovascular: s1s2 rrr, SR on monitor Abdomen: abd/soft, non-tender, bsx4 active, left abd protuberant/firm but no ecchymosis  Extremities: warm/dry, no edema.  L FA abrasion, L knee ecchymosis and abrasions, L great toe dry gangrene, areas of rounded ulceration on left foot and right foot  Neuro: AAOx4, speech clear, MAE     Resolved Hospital Problem list      Assessment & Plan:   Mechanical Fall  -supportive care, ? If related to hypotension after HD / volume depletion  -assess plain film of right knee -wound care per nursing  Left Peri-Nephric / Subcapsular Hematoma  -appreciate Trauma, IR, Urology  -plan for IR embolization of left kidney  Hypotension in setting of ABLA  -volume resuscitate with PRBC's   Acute Blood Loss Anemia / Normocytic Anemia  In setting of perinephric hematoma, extravasation. Not on anticoagulation. ASA only pre-admit.  -Transfuse 3 units PRBC now  -follow H/H   ESRD on HD  T/Th/S HD  -Nephrology consulted, appreciate input  -anticipate he may need HD with above volume administration  -Trend BMP / urinary output -Replace electrolytes as indicated  DM with Hyperglycemia  -SSI, very sensitive  scale   CAD, PVD  -supportive care  -will need follow up with Dr. Donzetta Matters post discharge and with Cardiology (follows in [place])  Moderate Protein Calorie Malnutrition  -diet when able  -ensure when able to take PO's  Hypothyroidism  -continue synthroid when able to take PO's   Depression  -hold wellbutrin, lexapro   Best Practice (right click and "Reselect all SmartList Selections" daily)  Diet/type: NPO DVT prophylaxis: SCD GI prophylaxis: N/A Lines: N/A Foley:  N/A Code Status:  full code Last date of multidisciplinary goals of care discussion: full code.    Labs   CBC: Recent Labs  Lab 05/30/22 1051 05/30/22 1107  WBC 9.1  --   NEUTROABS 7.8*  --   HGB 7.5* 8.2*  HCT 24.7* 24.0*  MCV 97.2  --   PLT 225  --     Basic Metabolic Panel: Recent Labs  Lab 05/30/22 1051 05/30/22 1107  NA 136 133*  K 5.3* 5.2*  CL 94* 94*  CO2 20*  --   GLUCOSE 265* 255*  BUN 46* 41*  CREATININE 8.61* 9.00*  CALCIUM 8.7*  --    GFR: Estimated Creatinine Clearance: 7.6 mL/min (A) (by C-G formula based on SCr of 9 mg/dL (H)). Recent Labs  Lab 05/30/22 1051  WBC 9.1  LATICACIDVEN 5.5*    Liver Function Tests: Recent Labs  Lab 05/30/22 1051  AST 12*  ALT 9  ALKPHOS 70  BILITOT 0.6  PROT 4.9*  ALBUMIN 2.7*   Recent Labs  Lab 05/30/22 1051  LIPASE 18   No results for input(s): "AMMONIA" in the last 168 hours.  ABG    Component Value Date/Time   TCO2 22 05/30/2022 1107     Coagulation Profile: Recent Labs  Lab 05/30/22 1051  INR 1.4*    Cardiac Enzymes: No results for input(s): "CKTOTAL", "CKMB", "CKMBINDEX", "TROPONINI" in the last 168 hours.  HbA1C: Hgb A1c MFr Bld  Date/Time Value Ref Range Status  04/08/2019 02:42 AM 6.4 (H) 4.8 - 5.6 % Final    Comment:    (NOTE) Pre diabetes:          5.7%-6.4% Diabetes:              >6.4% Glycemic control for   <7.0% adults with diabetes   08/17/2018 07:18 PM 9.7 (H) 4.8 - 5.6 % Final    Comment:     (NOTE) Pre diabetes:          5.7%-6.4% Diabetes:              >6.4% Glycemic control for   <7.0% adults with diabetes     CBG: No results for input(s): "GLUCAP" in the last 168 hours.  Review of Systems: Positives in [place]   Gen: Denies fever, chills, weight change, fatigue, night sweats HEENT: Denies blurred vision, double vision, hearing loss, tinnitus, sinus congestion, rhinorrhea, sore throat, neck  stiffness, dysphagia PULM: Denies shortness of breath, cough, sputum production, hemoptysis, wheezing CV: Denies chest pain, edema, orthopnea, paroxysmal nocturnal dyspnea, palpitations GI: Denies abdominal pain, nausea, vomiting, diarrhea, hematochezia, melena, constipation, change in bowel habits GU: Denies dysuria, hematuria, polyuria, oliguria, urethral discharge Endocrine: Denies hot or cold intolerance, polyuria, polyphagia or appetite change Derm: Denies rash, dry skin, scaling or peeling skin change Heme: Denies easy bruising, bleeding, bleeding gums Neuro: Denies headache, numbness, weakness, dizziness, slurred speech, loss of memory or consciousness  Past Medical History:  He,  has a past medical history of Acute cholecystitis (08/17/2018), Anemia, Arthritis, Atrial fibrillation ([place]), CAD (coronary artery disease), Cholecystitis, COPD (chronic obstructive pulmonary disease) ([place]), Depression, Diabetes mellitus without complication ([place]), ESRD (end stage renal disease) on dialysis ([place]), HOH (hard of hearing), Hypertension, Hypothyroidism, OSA (obstructive sleep apnea), Renal disorder, Spinal stenosis, Wears dentures, Wears glasses, and Wears hearing aid in both ears.   Surgical History:   Past Surgical History:  Procedure Laterality Date   ABDOMINAL AORTOGRAM N/A 01/15/2021   Procedure: ABDOMINAL AORTOGRAM;  Surgeon: Serafina Mitchell, MD;  Location: [place] [facility];  Service: Cardiovascular;  Laterality: N/A;   ABDOMINAL AORTOGRAM W/LOWER EXTREMITY Bilateral 12/11/2020    Procedure: ABDOMINAL AORTOGRAM W/LOWER EXTREMITY;  Surgeon: Serafina Mitchell, MD;  Location: [place] [facility];  Service: Cardiovascular;  Laterality: Bilateral;   ABDOMINAL AORTOGRAM W/LOWER EXTREMITY N/A 03/17/2022   Procedure: ABDOMINAL AORTOGRAM W/LOWER EXTREMITY;  Surgeon: Waynetta Sandy, MD;  Location: [place] [facility];  Service: Cardiovascular;  Laterality: N/A;   BASCILIC VEIN TRANSPOSITION Left 12/02/2019   Procedure: BASCILIC VEIN TRANSPOSITION LEFT FIRST STAGE;  Surgeon: Serafina Mitchell, MD;  Location: [place];  Service: Vascular;  Laterality: Left;   [place] Left 02/10/2020   Procedure: BASCILIC VEIN TRANSPOSITION LEFT SECOND STAGE;  Surgeon: Serafina Mitchell, MD;  Location: [place];  Service: Vascular;  Laterality: Left;   BILIARY STENT PLACEMENT  11/25/2018   Procedure: [place];  Surgeon: [name], MD;  Location: [facility]COPY;  Service: Gastroenterology;;   BIOPSY  12/20/2018   Procedure: BIOPSY;  Surgeon: [name], MD;  Location: [facility];  Service: Gastroenterology;;   BIOPSY  08/03/2020   Procedure: BIOPSY;  Surgeon: Otis Brace, MD;  Location: [facility];  Service: Gastroenterology;;  EGD and COLON   CARDIAC CATHETERIZATION     CHOLECYSTECTOMY N/A 11/14/2018   Procedure: LAPAROSCOPIC CHOLECYSTECTOMY WITH INTRAOPERATIVE CHOLANGIOGRAM;  Surgeon: Coralie Keens, MD;  Location: [place];  Service: General;  Laterality: N/A;   COLONOSCOPY W/ BIOPSIES AND POLYPECTOMY     COLONOSCOPY WITH PROPOFOL N/A 08/03/2020   Procedure: COLONOSCOPY WITH PROPOFOL;  Surgeon: Otis Brace, MD;  Location: [facility];  Service: Gastroenterology;  Laterality: N/A;   ERCP N/A 11/25/2018   Procedure: ENDOSCOPIC RETROGRADE CHOLANGIOPANCREATOGRAPHY (ERCP);  Surgeon: [name], MD;  Location: [place];  Service: Gastroenterology;  Laterality: N/A;   ESOPHAGOGASTRODUODENOSCOPY N/A 12/20/2018   Procedure: ESOPHAGOGASTRODUODENOSCOPY (EGD);   Surgeon: [name], MD;  Location: [facility];  Service: Gastroenterology;  Laterality: N/A;   ESOPHAGOGASTRODUODENOSCOPY (EGD) WITH PROPOFOL N/A 08/03/2020   Procedure: ESOPHAGOGASTRODUODENOSCOPY (EGD) WITH PROPOFOL;  Surgeon: Otis Brace, MD;  Location: [facility];  Service: Gastroenterology;  Laterality: N/A;   EYE SURGERY     cataract B/L   HAND SURGERY Right    IR DIALY SHUNT INTRO NEEDLE/INTRACATH INITIAL W/IMG LEFT Left 08/20/2018   IR PERC CHOLECYSTOSTOMY  08/19/2018   IR RADIOLOGIST EVAL & MGMT  09/29/2018  LOWER EXTREMITY ANGIOGRAPHY Left 01/15/2021   Procedure: Lower Extremity Angiography;  Surgeon: Serafina Mitchell, MD;  Location: [place] [facility];  Service: Cardiovascular;  Laterality: Left;   MULTIPLE TOOTH EXTRACTIONS     PERIPHERAL VASCULAR BALLOON ANGIOPLASTY Right 12/11/2020   Procedure: PERIPHERAL VASCULAR BALLOON ANGIOPLASTY;  Surgeon: Serafina Mitchell, MD;  Location: [place] [facility];  Service: Cardiovascular;  Laterality: Right;   PERIPHERAL VASCULAR BALLOON ANGIOPLASTY Left 01/15/2021   Procedure: PERIPHERAL VASCULAR BALLOON ANGIOPLASTY;  Surgeon: Serafina Mitchell, MD;  Location: [place] [facility];  Service: Cardiovascular;  Laterality: Left;  PT   POLYPECTOMY  08/03/2020   Procedure: POLYPECTOMY;  Surgeon: Otis Brace, MD;  Location: [facility];  Service: Gastroenterology;;   RESECTION OF ARTERIOVENOUS FISTULA ANEURYSM Left 10/27/2019   Procedure: Resection Of Left Upper Arm  Arteriovenous Fistula Aneurysm Times Two;  Surgeon: Serafina Mitchell, MD;  Location: [place];  Service: Vascular;  Laterality: Left;   SHOULDER SURGERY Right    SPHINCTEROTOMY  11/25/2018   Procedure: SPHINCTEROTOMY;  Surgeon: [name], MD;  Location[facility];  Service: Gastroenterology;;   Lavell Islam REMOVAL  12/20/2018   Procedure: STENT REMOVAL;  Surgeon: [name], MD;  Location: [facility];  Service: Gastroenterology;;   TIBIA FRACTURE SURGERY       Social History:    reports that he has never smoked. He has quit using smokeless tobacco.  His smokeless tobacco use included chew. He reports that he does not currently use alcohol. He reports that he does not use drugs.   Family History:  His family history includes Heart disease in his father, mother, and sister.   Allergies Allergies  Allergen Reactions   Avelox [Moxifloxacin Hcl] Other (See Comments)    Hallucinations    Gabapentin Other (See Comments)    Caused staggering and red feet   Metoprolol Other (See Comments)    Dropped pulse too low; was taken off of this by MD   Quinolones Other (See Comments)    unknown   Shellfish Allergy Nausea And Vomiting and Other (See Comments)    Made patient VERY nauseous and he developed stomach cramps   Sulfa Antibiotics Other (See Comments)    Allergy is from childhood (reaction not recalled)   Tetracyclines & Related Other (See Comments)    Blisters on hands and arms   Adhesive [Tape] Itching and Rash    Paper tape only- skin cannot tolerate "heavy" tapes     Home Medications  Prior to Admission medications   Medication Sig Start Date End Date Taking? Authorizing Provider  aspirin EC 81 MG tablet Take 81 mg by mouth at bedtime.     [provider]  atorvastatin (LIPITOR) 10 MG tablet Take 10 mg by mouth at bedtime.    [provider]  buPROPion (WELLBUTRIN XL) 150 MG 24 hr tablet Take 150 mg by mouth every evening.    [provider]  docusate sodium (COLACE) 100 MG capsule Take 100 mg by mouth 2 (two) times daily.     [provider]  escitalopram (LEXAPRO) 10 MG tablet Take 10 mg by mouth at bedtime.  08/30/15   [provider]  fluticasone (FLONASE) 50 MCG/ACT nasal spray Place 1 spray into both nostrils daily as needed for allergies or rhinitis.    [provider]  lactulose, encephalopathy, (CHRONULAC) 10 GM/15ML SOLN Take 10 g by mouth every evening. 08/02/18   [provider]   levothyroxine (SYNTHROID, LEVOTHROID) 125 MCG  tablet Take 125 mcg by mouth daily before breakfast.    [provider]  lidocaine-prilocaine (EMLA) cream Apply 1 application topically Every Tuesday,Thursday,and Saturday with dialysis. 08/02/18   [provider]  linaclotide (LINZESS) 290 MCG CAPS capsule Take 290 mcg by mouth every evening.     [provider]  loratadine (CLARITIN) 10 MG tablet Take 10 mg by mouth daily.     [provider]  montelukast (SINGULAIR) 10 MG tablet Take 10 mg every evening by mouth.    [provider]  nitroGLYCERIN (NITROSTAT) 0.4 MG SL tablet Place 0.4 mg under the tongue every 5 (five) minutes as needed for chest pain.    [provider]  oxyCODONE-acetaminophen (PERCOCET) 10-325 MG tablet Take 1 tablet by mouth every 4 (four) hours as needed for pain. 12/09/18   [provider]  PROAIR HFA 108 (90 Base) MCG/ACT inhaler Inhale 2 puffs into the lungs every 6 (six) hours as needed for shortness of breath. 05/27/20   [provider]  Propylene Glycol (SYSTANE BALANCE) 0.6 % SOLN Place 1 drop into both eyes as needed (dry eyes).    [provider]  sevelamer carbonate (RENVELA) 800 MG tablet Take 3 tablets (2,400 mg total) by mouth 3 (three) times daily with meals. Patient taking differently: Take 2,400 mg by mouth 2 (two) times daily with a meal. 08/24/18   Elgergawy, Silver Huguenin, MD     Critical care time: 62 minutes    Noe Gens, MSN, APRN, NP-C, AGACNP-BC [facility] 05/30/2022, 1:30 PM   Please see [email or web address] for pager details.   From 7A-7P if no response, please call [phone] After hours, please call ELink [phone]

## 2022-05-30 NOTE — ED Provider Notes (Signed)
Patient presents to the ED for evaluation of abdominal pain.  Hypotension.  Patient states he fell yesterday because he was feeling lightheaded.  He did not completely pass out.  Since that time he has been having pain in his abdomen.  He did vomit last night.  Patient usually goes to dialysis Tuesday Thursday Saturdays Physical Exam  BP (!) 87/61 (BP Location: Right Arm)   Pulse 100   Temp 98.3 F (36.8 C) (Oral)   Resp 20   Ht 1.778 m ('5\' 10"'$ )   Wt 92 kg   SpO2 91%   BMI 29.10 kg/m   Physical Exam General alert awake speaking easily Abdomen tender, no hematoma noted  Procedures  .Critical Care  Performed by: Dorie Rank, MD Authorized by: Dorie Rank, MD   Critical care provider statement:    Critical care time (minutes):  45   Critical care was time spent personally by me on the following activities:  Development of treatment plan with patient or surrogate, discussions with consultants, evaluation of patient's response to treatment, examination of patient, ordering and review of laboratory studies, ordering and review of radiographic studies, ordering and performing treatments and interventions, pulse oximetry, re-evaluation of patient's condition and review of old charts   ED Course / MDM   Clinical Course as of 05/30/22 1249  Fri May 30, 2022  1141 Labs show significant drop in his hemoglobin.  Likely source of his hypotension.  We will Hemoccult to assess for signs of GI bleeding and CT scan has been ordered to evaluate for intra-abdominal and intrathoracic bleeding [JK]    Clinical Course User Index [JK] Dorie Rank, MD   Medical Decision Making Amount and/or Complexity of Data Reviewed Labs: ordered. Radiology: ordered. ECG/medicine tests: ordered.  Risk Prescription drug management.  Patient hypotensive on arrival.  Despite his hypotension does not appear to be in distress.  Concerning however for critical illness including acute blood loss anemia, sepsis,  cardiogenic shock, hemorrhagic shock.  We will start with IV fluids, CT scanning   CT scan demonstrates bleeding around left kidney.  Hgb significantly decreased, consistent with acute blood loss, possible active bleeding.  Emergent blood transfusions ordered.  Critical care, trauma surg, urology consulted.  Possible IR intervention for the bleeding        Dorie Rank, MD 05/30/22 1306

## 2022-05-30 NOTE — ED Provider Notes (Signed)
[facility] EMERGENCY DEPARTMENT Provider Note   CSN: [id number] Arrival date & time: 05/30/22  1011     History {Add pertinent medical, surgical, social history, OB history to HPI:1} No chief complaint on file.   Rueben Seabrooks is a 81 y.o. male.  Patient with history of ESRD on dialysis, diabetes, hypertension, CAD presents today with complaints of left sided abdominal pain and hypotension. States that yesterday afternoon he was attempting to get out of his car and lost his footing and fell on his left side. States that he has had pain since then that is worsening along with abdominal distension. Patients wife states that she checked his blood pressure this morning which is routine for him and found that it was in the 90Z systolic and called EMS for management of same. Patient states that he does Tuesday, Thursday, Saturday hemodialysis and completed course of dialysis yesterday. Also endorses some blood in his stools, however states its only ever present when he wipes and he suspected same was from known hemorrhoids. Denies any fevers, chills, chest pain, or shortness of breath. Does endorse some nausea and vomiting.  The history is provided by the patient. No language interpreter was used.       Home Medications Prior to Admission medications   Medication Sig Start Date End Date Taking? Authorizing Provider  aspirin EC 81 MG tablet Take 81 mg by mouth at bedtime.     [provider]  atorvastatin (LIPITOR) 10 MG tablet Take 10 mg by mouth at bedtime.    [provider]  buPROPion (WELLBUTRIN XL) 150 MG 24 hr tablet Take 150 mg by mouth every evening.    [provider]  docusate sodium (COLACE) 100 MG capsule Take 100 mg by mouth 2 (two) times daily.     [provider]  escitalopram (LEXAPRO) 10 MG tablet Take 10 mg by mouth at bedtime.  08/30/15   [provider]  fluticasone (FLONASE) 50 MCG/ACT nasal spray Place 1 spray  into both nostrils daily as needed for allergies or rhinitis.    [provider]  lactulose, encephalopathy, (CHRONULAC) 10 GM/15ML SOLN Take 10 g by mouth every evening. 08/02/18   [provider]  levothyroxine (SYNTHROID, LEVOTHROID) 125 MCG tablet Take 125 mcg by mouth daily before breakfast.    [provider]  lidocaine-prilocaine (EMLA) cream Apply 1 application topically Every Tuesday,Thursday,and Saturday with dialysis. 08/02/18   [provider]  linaclotide (LINZESS) 290 MCG CAPS capsule Take 290 mcg by mouth every evening.     [provider]  loratadine (CLARITIN) 10 MG tablet Take 10 mg by mouth daily.     [provider]  montelukast (SINGULAIR) 10 MG tablet Take 10 mg every evening by mouth.    [provider]  nitroGLYCERIN (NITROSTAT) 0.4 MG SL tablet Place 0.4 mg under the tongue every 5 (five) minutes as needed for chest pain.    [provider]  oxyCODONE-acetaminophen (PERCOCET) 10-325 MG tablet Take 1 tablet by mouth every 4 (four) hours as needed for pain. 12/09/18   [provider]  PROAIR HFA 108 (90 Base) MCG/ACT inhaler Inhale 2 puffs into the lungs every 6 (six) hours as needed for shortness of breath. 05/27/20   [provider]  Propylene Glycol (SYSTANE BALANCE) 0.6 % SOLN Place 1 drop into both eyes as needed (dry eyes).    [provider]  sevelamer carbonate (RENVELA) 800 MG tablet Take 3 tablets (2,400 mg  total) by mouth 3 (three) times daily with meals. Patient taking differently: Take 2,400 mg by mouth 2 (two) times daily with a meal. 08/24/18   Elgergawy, Silver Huguenin, MD      Allergies    Avelox [moxifloxacin hcl], Gabapentin, Metoprolol, Quinolones, Shellfish allergy, Sulfa antibiotics, Tetracyclines & related, and Adhesive [tape]    Review of Systems   Review of Systems  Gastrointestinal:  Positive for abdominal pain, nausea and vomiting.  All other systems reviewed and  are negative.   Physical Exam Updated Vital Signs BP (!) 63/48   Pulse (!) 106   Temp 98.3 F (36.8 C) (Oral)   Resp (!) 23   Ht '5\' 10"'$  (1.778 m)   Wt 92 kg   SpO2 100%   BMI 29.10 kg/m  Physical Exam Vitals and nursing note reviewed. Exam conducted with a chaperone present.  Constitutional:      General: He is not in acute distress.    Appearance: Normal appearance. He is obese. He is not ill-appearing, toxic-appearing or diaphoretic.  HENT:     Head: Normocephalic and atraumatic.  Cardiovascular:     Rate and Rhythm: Normal rate.  Pulmonary:     Effort: Pulmonary effort is normal. No respiratory distress.  Abdominal:     General: There is distension.     Palpations: There is mass.     Tenderness: There is abdominal tenderness.     Comments: Left sided palpable mass present  Genitourinary:    Comments: Soft brown stool present in the rectal vault without signs of hematochezia or melena Musculoskeletal:        General: Normal range of motion.     Cervical back: Normal range of motion.  Skin:    General: Skin is warm and dry.  Neurological:     General: No focal deficit present.     Mental Status: He is alert.  Psychiatric:        Mood and Affect: Mood normal.        Behavior: Behavior normal.     ED Results / Procedures / Treatments   Labs (all labs ordered are listed, but only abnormal results are displayed) Labs Reviewed  I-STAT CHEM 8, ED - Abnormal; Notable for the following components:      Result Value   Sodium 133 (*)    Potassium 5.2 (*)    Chloride 94 (*)    BUN 41 (*)    Creatinine, Ser 9.00 (*)    Glucose, Bld 255 (*)    Calcium, Ion 1.06 (*)    Hemoglobin 8.2 (*)    HCT 24.0 (*)    All other components within normal limits  CULTURE, BLOOD (ROUTINE X 2)  CULTURE, BLOOD (ROUTINE X 2)  CBC WITH DIFFERENTIAL/PLATELET  COMPREHENSIVE METABOLIC PANEL  LIPASE, BLOOD  LACTIC ACID, PLASMA  LACTIC ACID, PLASMA  PROTIME-INR  APTT    EKG EKG  Interpretation  Date/Time:  Friday May 30 2022 10:47:33 EDT Ventricular Rate:  109 PR Interval:  236 QRS Duration: 148 QT Interval:  401 QTC Calculation: 540 R Axis:   13 Text Interpretation: Sinus tachycardia Prolonged PR interval Prominent P waves, nondiagnostic Left bundle branch block Since last tracing rate faster Confirmed by Dorie Rank [phone]) on 05/30/2022 10:50:12 AM  Radiology CT ABDOMEN PELVIS W CONTRAST  Result Date: 05/30/2022 CLINICAL DATA:  Nonlocalized abdominal pain. EXAM: CT ABDOMEN AND PELVIS WITH CONTRAST TECHNIQUE: Multidetector CT imaging of the abdomen and pelvis was performed using the standard  protocol following bolus administration of intravenous contrast. RADIATION DOSE REDUCTION: This exam was performed according to the departmental dose-optimization program which includes automated exposure control, adjustment of the mA and/or kV according to patient size and/or use of iterative reconstruction technique. CONTRAST:  160m OMNIPAQUE IOHEXOL 350 MG/ML SOLN COMPARISON:  Abdomen/pelvis CT 04/07/2019 FINDINGS: Lower chest: Please see report for chest CTA performed at the same time. Hepatobiliary: No suspicious focal abnormality within the liver parenchyma. Gallbladder is surgically absent. Pneumobilia suggests prior sphincterotomy. Pancreas: No focal mass lesion. No dilatation of the main duct. No intraparenchymal cyst. No peripancreatic edema. Spleen: No splenomegaly. No focal mass lesion. Adrenals/Urinary Tract: Stable 14 mm left adrenal nodule consistent with adenoma. No followup recommended. Right adrenal gland unremarkable. Innumerable small cystic lesions identified in the right kidney, the more [place] which are too small to characterize on this exam. Innumerable cystic foci are seen in the left kidney with a large perinephric heterogeneous collection compatible with hematoma. Hematoma is probably subcapsular and measures 14.9 x 6.4 x 17.3 cm. Foci of increased  attenuation in the interpolar left kidney between the parenchyma and the hematoma (axial 46/1 and coronal 87/6) raise the question of active extravasation. There is stranding around the left kidney compatible with edema/hemorrhage. Hemorrhage extends down the retroperitoneum into the left pelvic sidewall. Hematoma generates mass-effect on the left kidney. No hydroureteronephrosis. Bladder is decompressed. Stomach/Bowel: Tiny hiatal hernia. Stomach otherwise unremarkable. Duodenum is normally positioned as is the ligament of Treitz. No small bowel wall thickening. No small bowel dilatation. The terminal ileum is normal. The appendix is normal. No gross colonic mass. No colonic wall thickening. Vascular/Lymphatic: There is advanced atherosclerotic calcification of the abdominal aorta without aneurysm. There is no gastrohepatic or hepatoduodenal ligament lymphadenopathy. No retroperitoneal or mesenteric lymphadenopathy. No pelvic sidewall lymphadenopathy. Reproductive: The prostate gland and seminal vesicles are unremarkable. Other: No substantial intraperitoneal free fluid. Musculoskeletal: No worrisome lytic or sclerotic osseous abnormality. Bilateral pars interarticularis defects noted at L5. IMPRESSION: 1. Markedly large left perinephric/subcapsular hematoma involving the left kidney. There are adjacent foci of ill-defined increased attenuation at the margin of the kidney and the hematoma raising concern for active or ongoing bleeding. Hemorrhage extends in the retroperitoneal tissues inferiorly to the extraperitoneal left pelvic sidewall. Follow-up recommended to exclude hemorrhage secondary to underlying mass lesion. 2. Innumerable tiny cystic lesions in both kidneys, too small to characterize and progressive in the interval since 2020. 3. Pneumobilia suggests prior sphincterotomy. 4. 14 mm left adrenal adenoma. Critical Value/emergent results were called by telephone at the time of interpretation on 05/30/2022 at  12:14 pm [facility], who verbally acknowledged these results. Electronically Signed   By: EMisty StanleyM.D.   On: 05/30/2022 12:15   CT Angio Chest PE W and/or Wo Contrast  Result Date: 05/30/2022 CLINICAL DATA:  Nonlocalized abdominal pain. EXAM: CT ABDOMEN AND PELVIS WITH CONTRAST TECHNIQUE: Multidetector CT imaging of the abdomen and pelvis was performed using the standard protocol following bolus administration of intravenous contrast. RADIATION DOSE REDUCTION: This exam was performed according to the departmental dose-optimization program which includes automated exposure control, adjustment of the mA and/or kV according to patient size and/or use of iterative reconstruction technique. CONTRAST:  1036mOMNIPAQUE IOHEXOL 350 MG/ML SOLN COMPARISON:  Abdomen/pelvis CT 04/07/2019 FINDINGS: Lower chest: Please see report for chest CTA performed at the same time. Hepatobiliary: No suspicious focal abnormality within the liver parenchyma. Gallbladder is surgically absent. Pneumobilia suggests prior sphincterotomy. Pancreas: No focal mass  lesion. No dilatation of the main duct. No intraparenchymal cyst. No peripancreatic edema. Spleen: No splenomegaly. No focal mass lesion. Adrenals/Urinary Tract: Stable 14 mm left adrenal nodule consistent with adenoma. No followup recommended. Right adrenal gland unremarkable. Innumerable small cystic lesions identified in the right kidney, the more [place] of which are too small to characterize on this exam. Innumerable cystic foci are seen in the left kidney with a large perinephric heterogeneous collection compatible with hematoma. Hematoma is probably subcapsular and measures 14.9 x 6.4 x 17.3 cm. Foci of increased attenuation in the interpolar left kidney between the parenchyma and the hematoma (axial 46/1 and coronal 87/6) raise the question of active extravasation. There is stranding around the left kidney compatible with edema/hemorrhage. Hemorrhage extends  down the retroperitoneum into the left pelvic sidewall. Hematoma generates mass-effect on the left kidney. No hydroureteronephrosis. Bladder is decompressed. Stomach/Bowel: Tiny hiatal hernia. Stomach otherwise unremarkable. Duodenum is normally positioned as is the ligament of Treitz. No small bowel wall thickening. No small bowel dilatation. The terminal ileum is normal. The appendix is normal. No gross colonic mass. No colonic wall thickening. Vascular/Lymphatic: There is advanced atherosclerotic calcification of the abdominal aorta without aneurysm. There is no gastrohepatic or hepatoduodenal ligament lymphadenopathy. No retroperitoneal or mesenteric lymphadenopathy. No pelvic sidewall lymphadenopathy. Reproductive: The prostate gland and seminal vesicles are unremarkable. Other: No substantial intraperitoneal free fluid. Musculoskeletal: No worrisome lytic or sclerotic osseous abnormality. Bilateral pars interarticularis defects noted at L5. IMPRESSION: 1. Markedly large left perinephric/subcapsular hematoma involving the left kidney. There are adjacent foci of ill-defined increased attenuation at the margin of the kidney and the hematoma raising concern for active or ongoing bleeding. Hemorrhage extends in the retroperitoneal tissues inferiorly to the extraperitoneal left pelvic sidewall. Follow-up recommended to exclude hemorrhage secondary to underlying mass lesion. 2. Innumerable tiny cystic lesions in both kidneys, too small to characterize and progressive in the interval since 2020. 3. Pneumobilia suggests prior sphincterotomy. 4. 14 mm left adrenal adenoma. Critical Value/emergent results were called by telephone at the time of interpretation on 05/30/2022 at 12:14 pm to provider [facility] , who verbally acknowledged these results. Electronically Signed   By: Misty Stanley M.D.   On: 05/30/2022 12:15   DG Chest 1 View  Result Date: 05/30/2022 CLINICAL DATA:  Questionable sepsis. EXAM: CHEST  1 VIEW  COMPARISON:  01/02/2022 FINDINGS: Stable cardiomediastinal contours. Lung volumes are low. No pleural effusion or edema. No airspace opacities identified. Visualized osseous structures appear intact. IMPRESSION: Low lung volumes. Electronically Signed   By: Kerby Moors M.D.   On: 05/30/2022 11:53    Procedures .Critical Care  Performed by: Bud Face, PA-C Authorized by: Bud Face, PA-C   Critical care provider statement:    Critical care time (minutes):  75   Critical care start time:  05/30/2022 11:00 AM   Critical care end time:  05/30/2022 12:45 PM   Critical care was necessary to treat or prevent imminent or life-threatening deterioration of the following conditions:  Circulatory failure, renal failure, shock and trauma   Critical care was time spent personally by me on the following activities:  Development of treatment plan with patient or surrogate, discussions with consultants, discussions with primary provider, evaluation of patient's response to treatment, examination of patient, obtaining history from patient or surrogate, ordering and review of laboratory studies, ordering and review of radiographic studies, pulse oximetry and re-evaluation of patient's condition   I assumed direction of critical care for this patient from  another provider in my specialty: no     Care discussed with: admitting provider     {Document cardiac monitor, telemetry assessment procedure when appropriate:1}  Medications Ordered in ED Medications  lactated ringers bolus 1,000 mL (1,000 mLs Intravenous New Bag/Given 05/30/22 1102)    ED Course/ Medical Decision Making/ A&P Clinical Course as of 05/30/22 1204  Fri May 30, 2022  1141 Labs show significant drop in his hemoglobin.  Likely source of his hypotension.  We will Hemoccult to assess for signs of GI bleeding and CT scan has been ordered to evaluate for intra-abdominal and intrathoracic bleeding [JK]    Clinical Course User Index [JK]  Dorie Rank, MD                           Medical Decision Making Amount and/or Complexity of Data Reviewed Labs: ordered. Radiology: ordered. ECG/medicine tests: ordered.   This patient presents to the ED for concern of abdominal pain and hypotension, this involves an extensive number of treatment options, and is a complaint that carries with it a high risk of complications and morbidity.  Co morbidities that complicate the patient evaluation  ESRD on hemodialysis, diabetes   Additional history obtained:  Additional history obtained from patients wife who is present in the room   Lab Tests:  I Ordered, and personally interpreted labs.  The pertinent results include:  hemoglobin 7.5 down from 13.9 2 months ago. K 5.3, chloride 94, bicarb 20, glucose 265. BUN 46, creatinine 8.61 consistent with baseline. Total protein 4.9, albumin 2.7. Lactic 5.5. Hemoccult positive   Imaging Studies ordered:  I ordered imaging studies including CXR, CTPE,  CT abdomen pelvis I independently visualized and interpreted imaging which showed  CXR: no acute findings CTPE: negative CT abdomen pelvis with contrast: 1. Markedly large left perinephric/subcapsular hematoma involving the left kidney. There are adjacent foci of ill-defined increased attenuation at the margin of the kidney and the hematoma raising concern for active or ongoing bleeding. Hemorrhage extends in the retroperitoneal tissues inferiorly to the extraperitoneal left pelvic sidewall. I agree with the radiologist interpretation   Cardiac Monitoring: / EKG:  The patient was maintained on a cardiac monitor.  I personally viewed and interpreted the cardiac monitored which showed an underlying rhythm of: unchanged from previous   Consultations Obtained:  I requested consultation with the urology and general surgery,  and discussed lab and imaging findings as well as pertinent plan - they recommend: admit to medicine, general surgery  will follow. Urology available if needed by surgery   Problem List / ED Course / Critical interventions / Medication management  I ordered medication including fluid and RBCs  for hypotension and concern for active hemorrhage  Reevaluation of the patient after these medicines showed that the patient improved I have reviewed the patients home medicines and have made adjustments as needed  Test / Admission - Considered:  Patient presents today after fall yesterday with LLQ abdominal pain. Found to be hypotensive in the 60s, aggressively fluid rehydrated. Found to have hemoglobin 7.5 so pRBCs ordered as well with concern for active intrabdominal bleeding. Hemoccult performed as well as patient states that he has had some blood in his stool. This was positive, however given no obvious signs of bleeding on exam doubtful to be cause of patients symptoms. CT scan revealed perinephritic hematoma with concern for active bleeding. Consulted trauma Dr. Grandville Silos who saw the patient and will consult urology/IR for  management but request MICU admission. Patient and his wife understanding and amenable with plan. Blood pressure continues to maintain in the 16B systolic at this time.  Discussed patient with MICU who accepts admission  This is a shared visit with supervising physician Dr. Tomi Bamberger who has independently evaluated patient & provided guidance in evaluation/management/disposition, in agreement with care    Final Clinical Impression(s) / ED Diagnoses Final diagnoses:  Traumatic perinephric hematoma of left kidney, initial encounter  Hypotension due to hypovolemia    Rx / DC Orders ED Discharge Orders     None

## 2022-05-30 NOTE — Consult Note (Addendum)
[facility] Renal Consultation Note    Indication for Consultation:  Management of ESRD/hemodialysis; anemia, hypertension/volume and secondary hyperparathyroidism  HPI: Christian Dalton is a 81 y.o. male with ESRD on HD, DMT2, HLD, CAD, OSA, PAD who is admitted after a fall at home yesterday. He fell onto his left side.  No head injury or loss of conciseness reported. Family reporting low BP and EMS was called.  On CT Abd found to have large perinephric/subscapular hematoma of the left kidney. IR and trauma surgery consulted. For angiogram/embolization today per IR. Hgb 7.5 -transfusion started in the ED.  Remains hypotensive SBPs 80-70s. CCM to admit to ICU.   Seen and examined in the ED. He responds briefly to questions. Wife providing history. States he had been home from dialysis for awhile before the fall. C/o of dizziness and was pale afterwards, not behaving like himself. Not on chronic anticoagulation other than ASA.  He was scheduled for a heart cath today.   Dialysis TTS at [facility]. He does not adhere to the dialysis prescription and usually only receives about 2 hours of dialysis each treatment. Last dialysis was 7/6 and he left 1.3 kg over his dry weight.  Past Medical History:  Diagnosis Date   Acute cholecystitis 08/17/2018   Anemia    Arthritis    Atrial fibrillation (HCC)    CAD (coronary artery disease)    Cholecystitis    COPD (chronic obstructive pulmonary disease) (HCC)    Depression    Diabetes mellitus without complication (HCC)    ESRD (end stage renal disease) on dialysis (HCC)    HOH (hard of hearing)    Hypertension    Hypothyroidism    OSA (obstructive sleep apnea)    does not wear cpap   Spinal stenosis    Wears dentures    wears top dentures   Wears glasses    Wears hearing aid in both ears    Past Surgical History:  Procedure Laterality Date   ABDOMINAL AORTOGRAM N/A 01/15/2021   Procedure: ABDOMINAL AORTOGRAM;  Surgeon:  Serafina Mitchell, MD;  Location: [place] [facility];  Service: Cardiovascular;  Laterality: N/A;   ABDOMINAL AORTOGRAM W/LOWER EXTREMITY Bilateral 12/11/2020   Procedure: ABDOMINAL AORTOGRAM W/LOWER EXTREMITY;  Surgeon: Serafina Mitchell, MD;  Location: [place] [facility];  Service: Cardiovascular;  Laterality: Bilateral;   ABDOMINAL AORTOGRAM W/LOWER EXTREMITY N/A 03/17/2022   Procedure: ABDOMINAL AORTOGRAM W/LOWER EXTREMITY;  Surgeon: Waynetta Sandy, MD;  Location: [place] [facility];  Service: Cardiovascular;  Laterality: N/A;   BASCILIC VEIN TRANSPOSITION Left 12/02/2019   Procedure: BASCILIC VEIN TRANSPOSITION LEFT FIRST STAGE;  Surgeon: Serafina Mitchell, MD;  Location: [place];  Service: Vascular;  Laterality: Left;   [place] Left 02/10/2020   Procedure: BASCILIC VEIN TRANSPOSITION LEFT SECOND STAGE;  Surgeon: Serafina Mitchell, MD;  Location: [place];  Service: Vascular;  Laterality: Left;   BILIARY STENT PLACEMENT  11/25/2018   Procedure: BILIARY STENT PLACEMENT;  Surgeon: [name], MD;  Location: [facility] ENDOSCOPY;  Service: Gastroenterology;;   BIOPSY  12/20/2018   Procedure: BIOPSY;  Surgeon: [name], MD;  Location: [facility] ENDOSCOPY;  Service: Gastroenterology;;   BIOPSY  08/03/2020   Procedure: BIOPSY;  Surgeon: Otis Brace, MD;  Location: [facility] ENDOSCOPY;  Service: Gastroenterology;;  EGD and COLON   CARDIAC CATHETERIZATION     CHOLECYSTECTOMY N/A 11/14/2018   Procedure: LAPAROSCOPIC CHOLECYSTECTOMY WITH INTRAOPERATIVE CHOLANGIOGRAM;  Surgeon: Coralie Keens, MD;  Location: [facility]  OR;  Service: General;  Laterality: N/A;   COLONOSCOPY W/ BIOPSIES AND POLYPECTOMY     COLONOSCOPY WITH PROPOFOL N/A 08/03/2020   Procedure: COLONOSCOPY WITH PROPOFOL;  Surgeon: Otis Brace, MD;  Location: [facility] ENDOSCOPY;  Service: Gastroenterology;  Laterality: N/A;   ERCP N/A 11/25/2018   Procedure: ENDOSCOPIC RETROGRADE CHOLANGIOPANCREATOGRAPHY (ERCP);  Surgeon: [name], MD;   Location: [place];  Service: Gastroenterology;  Laterality: N/A;   ESOPHAGOGASTRODUODENOSCOPY N/A 12/20/2018   Procedure: ESOPHAGOGASTRODUODENOSCOPY (EGD);  Surgeon: [name], MD;  Location: [facility];  Service: Gastroenterology;  Laterality: N/A;   ESOPHAGOGASTRODUODENOSCOPY (EGD) WITH PROPOFOL N/A 08/03/2020   Procedure: ESOPHAGOGASTRODUODENOSCOPY (EGD) WITH PROPOFOL;  Surgeon: Otis Brace, MD;  Location: [facility] ENDOSCOPY;  Service: Gastroenterology;  Laterality: N/A;   EYE SURGERY     cataract B/L   HAND SURGERY Right    IR DIALY SHUNT INTRO NEEDLE/INTRACATH INITIAL W/IMG LEFT Left 08/20/2018   IR PERC CHOLECYSTOSTOMY  08/19/2018   IR RADIOLOGIST EVAL & MGMT  09/29/2018   LOWER EXTREMITY ANGIOGRAPHY Left 01/15/2021   Procedure: Lower Extremity Angiography;  Surgeon: Serafina Mitchell, MD;  Location: [place] [facility];  Service: Cardiovascular;  Laterality: Left;   MULTIPLE TOOTH EXTRACTIONS     PERIPHERAL VASCULAR BALLOON ANGIOPLASTY Right 12/11/2020   Procedure: PERIPHERAL VASCULAR BALLOON ANGIOPLASTY;  Surgeon: Serafina Mitchell, MD;  Location: [place] [facility];  Service: Cardiovascular;  Laterality: Right;   PERIPHERAL VASCULAR BALLOON ANGIOPLASTY Left 01/15/2021   Procedure: PERIPHERAL VASCULAR BALLOON ANGIOPLASTY;  Surgeon: Serafina Mitchell, MD;  Location: [place] [facility];  Service: Cardiovascular;  Laterality: Left;  PT   POLYPECTOMY  08/03/2020   Procedure: POLYPECTOMY;  Surgeon: Otis Brace, MD;  Location: [facility] ENDOSCOPY;  Service: Gastroenterology;;   RESECTION OF ARTERIOVENOUS FISTULA ANEURYSM Left 10/27/2019   Procedure: Resection Of Left Upper Arm  Arteriovenous Fistula Aneurysm Times Two;  Surgeon: Serafina Mitchell, MD;  Location: [place];  Service: Vascular;  Laterality: Left;   SHOULDER SURGERY Right    SPHINCTEROTOMY  11/25/2018   Procedure: SPHINCTEROTOMY;  Surgeon: [name], MD;  Location: [place];  Service: Gastroenterology;;   [place]avell Islam REMOVAL  12/20/2018    Procedure: STENT REMOVAL;  Surgeon: [name], MD;  Location: [facility] ENDOSCOPY;  Service: Gastroenterology;;   TIBIA FRACTURE SURGERY     Family History  Problem Relation Age of Onset   Heart disease Mother    Heart disease Father    Heart disease Sister    Social History:  reports that he has never smoked. He has quit using smokeless tobacco.  His smokeless tobacco use included chew. He reports that he does not currently use alcohol. He reports that he does not use drugs. Allergies  Allergen Reactions   Avelox [Moxifloxacin Hcl] Other (See Comments)    Hallucinations    Gabapentin Other (See Comments)    Caused staggering and red feet   Metoprolol Other (See Comments)    Dropped pulse too low; was taken off of this by MD   Quinolones Other (See Comments)    unknown   Shellfish Allergy Nausea And Vomiting and Other (See Comments)    Made patient VERY nauseous and he developed stomach cramps   Sulfa Antibiotics Other (See Comments)    Allergy is from childhood (reaction not recalled)   Tetracyclines & Related Other (See Comments)    Blisters on hands and arms   Adhesive [Tape] Itching and Rash    Paper tape only- skin cannot tolerate "heavy" tapes  Prior to Admission medications   Medication Sig Start Date End Date Taking? Authorizing Provider  aspirin EC 81 MG tablet Take 81 mg by mouth at bedtime.     [provider]  atorvastatin (LIPITOR) 10 MG tablet Take 10 mg by mouth at bedtime.    [provider]  buPROPion (WELLBUTRIN XL) 150 MG 24 hr tablet Take 150 mg by mouth every evening.    [provider]  docusate sodium (COLACE) 100 MG capsule Take 100 mg by mouth 2 (two) times daily.     [provider]  escitalopram (LEXAPRO) 10 MG tablet Take 10 mg by mouth at bedtime.  08/30/15   [provider]  fluticasone (FLONASE) 50 MCG/ACT nasal spray Place 1 spray into both nostrils daily as needed for allergies or rhinitis.    [provider]  lactulose, encephalopathy, (CHRONULAC) 10 GM/15ML SOLN Take 10 g by mouth every evening. 08/02/18   [provider]  levothyroxine (SYNTHROID, LEVOTHROID) 125 MCG tablet Take 125 mcg by mouth daily before breakfast.    [provider]  lidocaine-prilocaine (EMLA) cream Apply 1 application topically Every Tuesday,Thursday,and Saturday with dialysis. 08/02/18   [provider]  linaclotide (LINZESS) 290 MCG CAPS capsule Take 290 mcg by mouth every evening.     [provider]  loratadine (CLARITIN) 10 MG tablet Take 10 mg by mouth daily.     [provider]  montelukast (SINGULAIR) 10 MG tablet Take 10 mg every evening by mouth.    [provider]  nitroGLYCERIN (NITROSTAT) 0.4 MG SL tablet Place 0.4 mg under the tongue every 5 (five) minutes as needed for chest pain.    [provider]  oxyCODONE-acetaminophen (PERCOCET) 10-325 MG tablet Take 1 tablet by mouth every 4 (four) hours as needed for pain. 12/09/18   [provider]  PROAIR HFA 108 (90 Base) MCG/ACT inhaler Inhale 2 puffs into the lungs every 6 (six) hours as needed for shortness of breath. 05/27/20   [provider]  Propylene Glycol (SYSTANE BALANCE) 0.6 % SOLN Place 1 drop into both eyes as needed (dry eyes).    [provider]  sevelamer carbonate (RENVELA) 800 MG tablet Take 3 tablets (2,400 mg total) by mouth 3 (three) times daily with meals. Patient taking differently: Take 2,400 mg by mouth 2 (two) times daily with a meal. 08/24/18   Elgergawy, Silver Huguenin, MD   Current Facility-Administered Medications  Medication Dose Route Frequency Provider Last Rate Last Admin   0.9 %  sodium chloride infusion (Manually program via Guardrails IV Fluids)   Intravenous Once Barkley Boards R, PA-C       0.9 %  sodium chloride infusion  10 mL/hr Intravenous Once Smoot, Sarah A, PA-C       0.9 %  sodium chloride infusion   Intravenous Continuous Sood,  Vineet, MD       Chlorhexidine Gluconate Cloth 2 % PADS 6 each  6 each Topical Daily Sood, Vineet, MD       insulin aspart (novoLOG) injection 0-6 Units  0-6 Units Subcutaneous Q4H Sood, Vineet, MD       Oral care mouth rinse  15 mL Mouth Rinse 4 times per day Chesley Mires, MD       Oral care mouth rinse  15 mL Mouth Rinse PRN Chesley Mires, MD       Current Outpatient Medications  Medication Sig Dispense Refill   aspirin EC 81 MG tablet Take 81 mg  by mouth at bedtime.      atorvastatin (LIPITOR) 10 MG tablet Take 10 mg by mouth at bedtime.     buPROPion (WELLBUTRIN XL) 150 MG 24 hr tablet Take 150 mg by mouth every evening.     docusate sodium (COLACE) 100 MG capsule Take 100 mg by mouth 2 (two) times daily.      escitalopram (LEXAPRO) 10 MG tablet Take 10 mg by mouth at bedtime.      fluticasone (FLONASE) 50 MCG/ACT nasal spray Place 1 spray into both nostrils daily as needed for allergies or rhinitis.     lactulose, encephalopathy, (CHRONULAC) 10 GM/15ML SOLN Take 10 g by mouth every evening.  4   levothyroxine (SYNTHROID, LEVOTHROID) 125 MCG tablet Take 125 mcg by mouth daily before breakfast.     lidocaine-prilocaine (EMLA) cream Apply 1 application topically Every Tuesday,Thursday,and Saturday with dialysis.  4   linaclotide (LINZESS) 290 MCG CAPS capsule Take 290 mcg by mouth every evening.      loratadine (CLARITIN) 10 MG tablet Take 10 mg by mouth daily.      montelukast (SINGULAIR) 10 MG tablet Take 10 mg every evening by mouth.     nitroGLYCERIN (NITROSTAT) 0.4 MG SL tablet Place 0.4 mg under the tongue every 5 (five) minutes as needed for chest pain.     oxyCODONE-acetaminophen (PERCOCET) 10-325 MG tablet Take 1 tablet by mouth every 4 (four) hours as needed for pain.     PROAIR HFA 108 (90 Base) MCG/ACT inhaler Inhale 2 puffs into the lungs every 6 (six) hours as needed for shortness of breath.     Propylene Glycol (SYSTANE BALANCE) 0.6 % SOLN Place 1 drop into both eyes as needed  (dry eyes).     sevelamer carbonate (RENVELA) 800 MG tablet Take 3 tablets (2,400 mg total) by mouth 3 (three) times daily with meals. (Patient taking differently: Take 2,400 mg by mouth 2 (two) times daily with a meal.) 45 tablet 0     ROS: As per HPI otherwise negative.  Physical Exam: Vitals:   05/30/22 1245 05/30/22 1300 05/30/22 1337 05/30/22 1352  BP: (!) 77/53 (!) 77/50 (!) 83/54 (!) 85/52  Pulse: 100 100 (!) 101 (!) 102  Resp: '15 12 20 '$ (!) 26  Temp:   98.6 F (37 C) 98.5 F (36.9 C)  TempSrc:   Oral Oral  SpO2: 100% 100% 100% 100%  Weight:      Height:         General: Pale, drowsy, minimally responsive, nad  Head: NCAT sclera not icteric MMM Neck: Supple. No JVD appreciated  Lungs: Clear bilaterally without wheezes, rales, or rhonchi.  Heart: RRR, no murmur, rub, or gallop  Abdomen: soft non-tender, bowel sounds normal, no masses  Lower extremities: Pitting edema R >L; Cool extremities, Ischemic blackened great toe L foot Neuro: Responds with one word answers, not following commands  Psych: Drowsy, minimally responsive  Dialysis Access: LUE aneurysmal +bruit   Labs: Basic Metabolic Panel: Recent Labs  Lab 05/30/22 1051 05/30/22 1107  NA 136 133*  K 5.3* 5.2*  CL 94* 94*  CO2 20*  --   GLUCOSE 265* 255*  BUN 46* 41*  CREATININE 8.61* 9.00*  CALCIUM 8.7*  --    Liver Function Tests: Recent Labs  Lab 05/30/22 1051  AST 12*  ALT 9  ALKPHOS 70  BILITOT 0.6  PROT 4.9*  ALBUMIN 2.7*   Recent Labs  Lab 05/30/22 1051  LIPASE 18   No  results for input(s): "AMMONIA" in the last 168 hours. CBC: Recent Labs  Lab 05/30/22 1051 05/30/22 1107  WBC 9.1  --   NEUTROABS 7.8*  --   HGB 7.5* 8.2*  HCT 24.7* 24.0*  MCV 97.2  --   PLT 225  --    Cardiac Enzymes: No results for input(s): "CKTOTAL", "CKMB", "CKMBINDEX", "TROPONINI" in the last 168 hours. CBG: No results for input(s): "GLUCAP" in the last 168 hours. Iron Studies: No results for input(s):  "IRON", "TIBC", "TRANSFERRIN", "FERRITIN" in the last 72 hours. Studies/Results: CT ABDOMEN PELVIS W CONTRAST  Result Date: 05/30/2022 CLINICAL DATA:  Nonlocalized abdominal pain. EXAM: CT ABDOMEN AND PELVIS WITH CONTRAST TECHNIQUE: Multidetector CT imaging of the abdomen and pelvis was performed using the standard protocol following bolus administration of intravenous contrast. RADIATION DOSE REDUCTION: This exam was performed according to the departmental dose-optimization program which includes automated exposure control, adjustment of the mA and/or kV according to patient size and/or use of iterative reconstruction technique. CONTRAST:  157m OMNIPAQUE IOHEXOL 350 MG/ML SOLN COMPARISON:  Abdomen/pelvis CT 04/07/2019 FINDINGS: Lower chest: Please see report for chest CTA performed at the same time. Hepatobiliary: No suspicious focal abnormality within the liver parenchyma. Gallbladder is surgically absent. Pneumobilia suggests prior sphincterotomy. Pancreas: No focal mass lesion. No dilatation of the main duct. No intraparenchymal cyst. No peripancreatic edema. Spleen: No splenomegaly. No focal mass lesion. Adrenals/Urinary Tract: Stable 14 mm left adrenal nodule consistent with adenoma. No followup recommended. Right adrenal gland unremarkable. Innumerable small cystic lesions identified in the right kidney, the more [place] which are too small to characterize on this exam. Innumerable cystic foci are seen in the left kidney with a large perinephric heterogeneous collection compatible with hematoma. Hematoma is probably subcapsular and measures 14.9 x 6.4 x 17.3 cm. Foci of increased attenuation in the interpolar left kidney between the parenchyma and the hematoma (axial 46/1 and coronal 87/6) raise the question of active extravasation. There is stranding around the left kidney compatible with edema/hemorrhage. Hemorrhage extends down the retroperitoneum into the left pelvic sidewall. Hematoma generates  mass-effect on the left kidney. No hydroureteronephrosis. Bladder is decompressed. Stomach/Bowel: Tiny hiatal hernia. Stomach otherwise unremarkable. Duodenum is normally positioned as is the ligament of Treitz. No small bowel wall thickening. No small bowel dilatation. The terminal ileum is normal. The appendix is normal. No gross colonic mass. No colonic wall thickening. Vascular/Lymphatic: There is advanced atherosclerotic calcification of the abdominal aorta without aneurysm. There is no gastrohepatic or hepatoduodenal ligament lymphadenopathy. No retroperitoneal or mesenteric lymphadenopathy. No pelvic sidewall lymphadenopathy. Reproductive: The prostate gland and seminal vesicles are unremarkable. Other: No substantial intraperitoneal free fluid. Musculoskeletal: No worrisome lytic or sclerotic osseous abnormality. Bilateral pars interarticularis defects noted at L5. IMPRESSION: 1. Markedly large left perinephric/subcapsular hematoma involving the left kidney. There are adjacent foci of ill-defined increased attenuation at the margin of the kidney and the hematoma raising concern for active or ongoing bleeding. Hemorrhage extends in the retroperitoneal tissues inferiorly to the extraperitoneal left pelvic sidewall. Follow-up recommended to exclude hemorrhage secondary to underlying mass lesion. 2. Innumerable tiny cystic lesions in both kidneys, too small to characterize and progressive in the interval since 2020. 3. Pneumobilia suggests prior sphincterotomy. 4. 14 mm left adrenal adenoma. Critical Value/emergent results were called by telephone at the time of interpretation on 05/30/2022 at 12:14 pm to [facility], who verbally acknowledged these results. Electronically Signed   By: EMisty StanleyM.D.   On: 05/30/2022 12:15  CT Angio Chest PE W and/or Wo Contrast  Result Date: 05/30/2022 CLINICAL DATA:  Nonlocalized abdominal pain. EXAM: CT ABDOMEN AND PELVIS WITH CONTRAST TECHNIQUE: Multidetector  CT imaging of the abdomen and pelvis was performed using the standard protocol following bolus administration of intravenous contrast. RADIATION DOSE REDUCTION: This exam was performed according to the departmental dose-optimization program which includes automated exposure control, adjustment of the mA and/or kV according to patient size and/or use of iterative reconstruction technique. CONTRAST:  17m OMNIPAQUE IOHEXOL 350 MG/ML SOLN COMPARISON:  Abdomen/pelvis CT 04/07/2019 FINDINGS: Lower chest: Please see report for chest CTA performed at the same time. Hepatobiliary: No suspicious focal abnormality within the liver parenchyma. Gallbladder is surgically absent. Pneumobilia suggests prior sphincterotomy. Pancreas: No focal mass lesion. No dilatation of the main duct. No intraparenchymal cyst. No peripancreatic edema. Spleen: No splenomegaly. No focal mass lesion. Adrenals/Urinary Tract: Stable 14 mm left adrenal nodule consistent with adenoma. No followup recommended. Right adrenal gland unremarkable. Innumerable small cystic lesions identified in the right kidney, the more [place] which are too small to characterize on this exam. Innumerable cystic foci are seen in the left kidney with a large perinephric heterogeneous collection compatible with hematoma. Hematoma is probably subcapsular and measures 14.9 x 6.4 x 17.3 cm. Foci of increased attenuation in the interpolar left kidney between the parenchyma and the hematoma (axial 46/1 and coronal 87/6) raise the question of active extravasation. There is stranding around the left kidney compatible with edema/hemorrhage. Hemorrhage extends down the retroperitoneum into the left pelvic sidewall. Hematoma generates mass-effect on the left kidney. No hydroureteronephrosis. Bladder is decompressed. Stomach/Bowel: Tiny hiatal hernia. Stomach otherwise unremarkable. Duodenum is normally positioned as is the ligament of Treitz. No small bowel wall thickening. No small  bowel dilatation. The terminal ileum is normal. The appendix is normal. No gross colonic mass. No colonic wall thickening. Vascular/Lymphatic: There is advanced atherosclerotic calcification of the abdominal aorta without aneurysm. There is no gastrohepatic or hepatoduodenal ligament lymphadenopathy. No retroperitoneal or mesenteric lymphadenopathy. No pelvic sidewall lymphadenopathy. Reproductive: The prostate gland and seminal vesicles are unremarkable. Other: No substantial intraperitoneal free fluid. Musculoskeletal: No worrisome lytic or sclerotic osseous abnormality. Bilateral pars interarticularis defects noted at L5. IMPRESSION: 1. Markedly large left perinephric/subcapsular hematoma involving the left kidney. There are adjacent foci of ill-defined increased attenuation at the margin of the kidney and the hematoma raising concern for active or ongoing bleeding. Hemorrhage extends in the retroperitoneal tissues inferiorly to the extraperitoneal left pelvic sidewall. Follow-up recommended to exclude hemorrhage secondary to underlying mass lesion. 2. Innumerable tiny cystic lesions in both kidneys, too small to characterize and progressive in the interval since 2020. 3. Pneumobilia suggests prior sphincterotomy. 4. 14 mm left adrenal adenoma. Critical Value/emergent results were called by telephone at the time of interpretation on 05/30/2022 at 12:14 pm to [facility], who verbally acknowledged these results. Electronically Signed   By: EMisty StanleyM.D.   On: 05/30/2022 12:15   DG Chest 1 View  Result Date: 05/30/2022 CLINICAL DATA:  Questionable sepsis. EXAM: CHEST  1 VIEW COMPARISON:  01/02/2022 FINDINGS: Stable cardiomediastinal contours. Lung volumes are low. No pleural effusion or edema. No airspace opacities identified. Visualized osseous structures appear intact. IMPRESSION: Low lung volumes. Electronically Signed   By: TKerby MoorsM.D.   On: 05/30/2022 11:53    Dialysis Orders:  Unit:  [facility] TTS Time: 3:45  EDW: 90.2 kg Flows: 425/800  [place]: 2K/2Ca  Access: AVF 15 g Heparin: None  ESA: Mircera 30 q 4 wks (to start 7/11) VDRA: Calcitriol 2.25 q HD   Assessment/Plan: Left kidney hematoma/ABLA - large perinephric/subscapular hematoma of left kidney after fall at home. IR consulted - for embolization today. Per primary team  ESRD -  HD TTS. Does not complete full treatments as outpatient. K+ 5.2 prior to transfusion. Hypotensive and going to ICU so will need to done at bedside. Plan for HD in am as staffing allows.  Hypertension/volume  - Hypotensive on admission in the setting of blood loss. Repletion with prbcs. HD if BP permits.  Anemia  - Hgb 7.5 on admission. 3 units prbcs ordered today. Follow trends  Metabolic bone disease -  Calcium acceptable. Continue home meds  Nutrition - Renal diet/fluid restriction when eating DM -per primary PAD - LLE ulcer - follows with Dr. Donzetta Matters CAD  Lynnda Child PA-C [facility] 05/30/2022, 2:17 PM

## 2022-05-30 NOTE — Consult Note (Signed)
Garnet Koyanagi Durene Fruits 1941/10/31  [id number].    Requesting MD: Dr. Dorie Rank Chief Complaint/Reason for Consult: Fall  HPI: Christian Dalton is a 81 y.o. male with a hx of ESRD on HD (Tues/Thurs/Sat), PAD with chronic ulcer to LLE on baby ASA, A. Fib not on anticoagulation, CAD, COPD, DM2, Hypothryoidism, HTN, CAD, and OSA (does not wear CPAP) who presented to the ED after a fall yesterday after attempting to get out of his car and lost his footing.  Patient reports he fell onto his left side.  Denies head trauma or loss of consciousness.  He has been feeling lightheaded since that time but no syncope.  He complains of left flank/abdominal pain, nausea and vomiting.  His wife checked his blood pressure at home and it was in the 60s so they called EMS.  Initially tachycardic and hypertensive on arrival (lowest pressure 63/48).  He has received 1L bolus of LR and is getting a unit of PRBC with improvement of BP and HR. Hgb 7.5, lactic 5.5, K 5.3, Glucose 265, Cr 8.61. CT A/P w markedly large left perinephric/subcapsular hematoma involving the left kidney with adjacent foci of ill-defined increased attenuation at the margin of the kidney and the hematoma raising concern for active or ongoing bleeding. The hemorrhage extends in the retroperitoneal tissues inferiorly to the extraperitoneal left pelvic sidewall.  Patient was planned to have [facility]EE [facility] marked worsening of EF from EF of 60-65% in 2019 to EF of 20-25% also showing new onset severe MR. This was cancelled. He is followed by Scharlene Gloss for LLE ulcer/PAD and appears they planned for angiogram and attempt at intervention of the LLE to be scheduled after his TEE and Left + right heart Cath.   On his angiogram in April Bilateral renal arteries were patent but did not fill the kidneys consistent with need for dialysis.  Hx Cholecystectomy  ROS: Negative other than HPI  Family History  Problem Relation Age of Onset    Heart disease Mother    Heart disease Father    Heart disease Sister     Past Medical History:  Diagnosis Date   Acute cholecystitis 08/17/2018   Anemia    Arthritis    Atrial fibrillation (HCC)    CAD (coronary artery disease)    Cholecystitis    COPD (chronic obstructive pulmonary disease) (HCC)    Depression    Diabetes mellitus without complication (HCC)    ESRD (end stage renal disease) on dialysis (HCC)    HOH (hard of hearing)    Hypertension    Hypothyroidism    OSA (obstructive sleep apnea)    does not wear cpap   Renal disorder    Spinal stenosis    Wears dentures    wears top dentures   Wears glasses    Wears hearing aid in both ears     Past Surgical History:  Procedure Laterality Date   ABDOMINAL AORTOGRAM N/A 01/15/2021   Procedure: ABDOMINAL AORTOGRAM;  Surgeon: Serafina Mitchell, MD;  Location: [place] [facility];  Service: Cardiovascular;  Laterality: N/A;   ABDOMINAL AORTOGRAM W/LOWER EXTREMITY Bilateral 12/11/2020   Procedure: ABDOMINAL AORTOGRAM W/LOWER EXTREMITY;  Surgeon: Serafina Mitchell, MD;  Location: [place] [facility];  Service: Cardiovascular;  Laterality: Bilateral;   ABDOMINAL AORTOGRAM W/LOWER EXTREMITY N/A 03/17/2022   Procedure: ABDOMINAL AORTOGRAM W/LOWER EXTREMITY;  Surgeon: Waynetta Sandy, MD;  Location: [place] [facility];  Service: Cardiovascular;  Laterality: N/A;   BASCILIC VEIN TRANSPOSITION Left 12/02/2019   Procedure: BASCILIC VEIN TRANSPOSITION LEFT FIRST STAGE;  Surgeon: Serafina Mitchell, MD;  Location: [place];  Service: Vascular;  Laterality: Left;   [place] Left 02/10/2020   Procedure: BASCILIC VEIN TRANSPOSITION LEFT SECOND STAGE;  Surgeon: Serafina Mitchell, MD;  Location: [place];  Service: Vascular;  Laterality: Left;   BILIARY STENT PLACEMENT  11/25/2018   Procedure: [place];  Surgeon: [name], MD;  Location: [facility];  Service: Gastroenterology;;   BIOPSY  12/20/2018   Procedure:  BIOPSY;  Surgeon: [name], MD;  Location: [facility];  Service: Gastroenterology;;   BIOPSY  08/03/2020   Procedure: BIOPSY;  Surgeon: Otis Brace, MD;  Location: [facility];  Service: Gastroenterology;;  EGD and COLON   CARDIAC CATHETERIZATION     CHOLECYSTECTOMY N/A 11/14/2018   Procedure: LAPAROSCOPIC CHOLECYSTECTOMY WITH INTRAOPERATIVE CHOLANGIOGRAM;  Surgeon: Coralie Keens, MD;  Location: [place];  Service: General;  Laterality: N/A;   COLONOSCOPY W/ BIOPSIES AND POLYPECTOMY     COLONOSCOPY WITH PROPOFOL N/A 08/03/2020   Procedure: COLONOSCOPY WITH PROPOFOL;  Surgeon: Otis Brace, MD;  Location: [facility];  Service: Gastroenterology;  Laterality: N/A;   ERCP N/A 11/25/2018   Procedure: ENDOSCOPIC RETROGRADE CHOLANGIOPANCREATOGRAPHY (ERCP);  Surgeon: [name], MD;  Location: [place];  Service: Gastroenterology;  Laterality: N/A;   ESOPHAGOGASTRODUODENOSCOPY N/A 12/20/2018   Procedure: ESOPHAGOGASTRODUODENOSCOPY (EGD);  Surgeon: [name], MD;  Location: [facility];  Service: Gastroenterology;  Laterality: N/A;   ESOPHAGOGASTRODUODENOSCOPY (EGD) WITH PROPOFOL N/A 08/03/2020   Procedure: ESOPHAGOGASTRODUODENOSCOPY (EGD) WITH PROPOFOL;  Surgeon: Otis Brace, MD;  Location: [facility];  Service: Gastroenterology;  Laterality: N/A;   EYE SURGERY     cataract B/L   HAND SURGERY Right    IR DIALY SHUNT INTRO NEEDLE/INTRACATH INITIAL W/IMG LEFT Left 08/20/2018   IR PERC CHOLECYSTOSTOMY  08/19/2018   IR RADIOLOGIST EVAL & MGMT  09/29/2018   LOWER EXTREMITY ANGIOGRAPHY Left 01/15/2021   Procedure: Lower Extremity Angiography;  Surgeon: Serafina Mitchell, MD;  Location: [place] [facility];  Service: Cardiovascular;  Laterality: Left;   MULTIPLE TOOTH EXTRACTIONS     PERIPHERAL VASCULAR BALLOON ANGIOPLASTY Right 12/11/2020   Procedure: PERIPHERAL VASCULAR BALLOON ANGIOPLASTY;  Surgeon: Serafina Mitchell, MD;  Location: [place] [facility];  Service: Cardiovascular;   Laterality: Right;   PERIPHERAL VASCULAR BALLOON ANGIOPLASTY Left 01/15/2021   Procedure: PERIPHERAL VASCULAR BALLOON ANGIOPLASTY;  Surgeon: Serafina Mitchell, MD;  Location: [place] [facility];  Service: Cardiovascular;  Laterality: Left;  PT   POLYPECTOMY  08/03/2020   Procedure: POLYPECTOMY;  Surgeon: Otis Brace, MD;  Location: [facility];  Service: Gastroenterology;;   RESECTION OF ARTERIOVENOUS FISTULA ANEURYSM Left 10/27/2019   Procedure: Resection Of Left Upper Arm  Arteriovenous Fistula Aneurysm Times Two;  Surgeon: Serafina Mitchell, MD;  Location: [place];  Service: Vascular;  Laterality: Left;   SHOULDER SURGERY Right    SPHINCTEROTOMY  11/25/2018   Procedure: SPHINCTEROTOMY;  Surgeon: [name], MD;  Location: [facility];  Service: Gastroenterology;;   Lavell Islam REMOVAL  12/20/2018   Procedure: STENT REMOVAL;  Surgeon: [name], MD;  Location: [facility];  Service: Gastroenterology;;   TIBIA FRACTURE SURGERY      Social History:  reports that he has never smoked. He has quit using smokeless tobacco.  His smokeless tobacco use included chew. He reports that he does not currently use alcohol. He reports that he does not use  drugs.  Allergies:  Allergies  Allergen Reactions   Avelox [Moxifloxacin Hcl] Other (See Comments)    Hallucinations    Gabapentin Other (See Comments)    Caused staggering and red feet   Metoprolol Other (See Comments)    Dropped pulse too low; was taken off of this by MD   Quinolones Other (See Comments)    unknown   Shellfish Allergy Nausea And Vomiting and Other (See Comments)    Made patient VERY nauseous and he developed stomach cramps   Sulfa Antibiotics Other (See Comments)    Allergy is from childhood (reaction not recalled)   Tetracyclines & Related Other (See Comments)    Blisters on hands and arms   Adhesive [Tape] Itching and Rash    Paper tape only- skin cannot tolerate "heavy" tapes    (Not in a hospital  admission)    Physical Exam: Blood pressure (!) 93/53, pulse 95, temperature 98.3 F (36.8 C), temperature source Oral, resp. rate 19, height '5\' 10"'$  (1.778 m), weight 92 kg, SpO2 99 %. General: pleasant, WD, elderly male who is laying in bed and hypotensive HEENT: head is normocephalic, atraumatic.  Sclera are noninjected. Ears and nose without any masses or lesions.  Mouth is pink and moist Heart: regular, rate, and rhythm.  Normal s1,s2. No obvious murmurs, gallops, or rubs noted.   Lungs: CTAB, no wheezes, rhonchi, or rales noted.  Respiratory effort nonlabored Abd: soft, mildly ttp in L flank without ecchymosis, no peritonitis, ND, +BS, no masses, hernias, or organomegaly MS: necrosis of L great toe Skin: abrasion to L forearm without active bleeding, abrasion to L shin with scab present  Neuro: Cranial nerves 2-12 grossly intact, sensation is normal throughout Psych: A&Ox3 with an appropriate affect.   Results for orders placed or performed during the hospital encounter of 05/30/22 (from the past 48 hour(s))  CBC with Differential     Status: Abnormal   Collection Time: 05/30/22 10:51 AM  Result Value Ref Range   WBC 9.1 4.0 - 10.5 K/uL   RBC 2.54 (L) 4.22 - 5.81 MIL/uL   Hemoglobin 7.5 (L) 13.0 - 17.0 g/dL   HCT 24.7 (L) 39.0 - 52.0 %   MCV 97.2 80.0 - 100.0 fL   MCH 29.5 26.0 - 34.0 pg   MCHC 30.4 30.0 - 36.0 g/dL   RDW 14.8 11.5 - 15.5 %   Platelets 225 150 - 400 K/uL   nRBC 0.0 0.0 - 0.2 %   Neutrophils Relative % 86 %   Neutro Abs 7.8 (H) 1.7 - 7.7 K/uL   Lymphocytes Relative 6 %   Lymphs Abs 0.5 (L) 0.7 - 4.0 K/uL   Monocytes Relative 8 %   Monocytes Absolute 0.8 0.1 - 1.0 K/uL   Eosinophils Relative 0 %   Eosinophils Absolute 0.0 0.0 - 0.5 K/uL   Basophils Relative 0 %   Basophils Absolute 0.0 0.0 - 0.1 K/uL   Immature Granulocytes 0 %   Abs Immature Granulocytes 0.04 0.00 - 0.07 K/uL    Comment: Performed at [facility], [address]  [place], [place] [address]  Lactic acid, plasma     Status: Abnormal   Collection Time: 05/30/22 10:51 AM  Result Value Ref Range   Lactic Acid, Venous 5.5 (HH) 0.5 - 1.9 mmol/L    Comment: CRITICAL RESULT CALLED TO, READ BACK BY AND VERIFIED WITH: A MCCLURE EMTP 05/30/2022 1232 DAVISB Performed at [facility] [address]  [place], [place] [address]   Protime-INR     Status: Abnormal   Collection Time: 05/30/22 10:51 AM  Result Value Ref Range   Prothrombin Time 17.1 (H) 11.4 - 15.2 seconds   INR 1.4 (H) 0.8 - 1.2    Comment: (NOTE) INR goal varies based on device and disease states. Performed at [facility], [place] [address] [place], [place] [address]   APTT     Status: None   Collection Time: 05/30/22 10:51 AM  Result Value Ref Range   aPTT 36 24 - 36 seconds    Comment: Performed at [facility] [address], [place], [place] [address]  I-stat chem 8, ED (not at [facility] or [facility])     Status: Abnormal   Collection Time: 05/30/22 11:07 AM  Result Value Ref Range   Sodium 133 (L) 135 - 145 mmol/L   Potassium 5.2 (H) 3.5 - 5.1 mmol/L   Chloride 94 (L) 98 - 111 mmol/L   BUN 41 (H) 8 - 23 mg/dL   Creatinine, Ser 9.00 (H) 0.61 - 1.24 mg/dL   Glucose, Bld 255 (H) 70 - 99 mg/dL    Comment: Glucose reference range applies only to samples taken after fasting for at least 8 hours.   Calcium, Ion 1.06 (L) 1.15 - 1.40 mmol/L   TCO2 22 22 - 32 mmol/L   Hemoglobin 8.2 (L) 13.0 - 17.0 g/dL   HCT 24.0 (L) 39.0 - 52.0 %  POC occult blood, ED     Status: Abnormal   Collection Time: 05/30/22 12:06 PM  Result Value Ref Range   Fecal Occult Bld POSITIVE (A) NEGATIVE   CT ABDOMEN PELVIS W CONTRAST  Result Date: 05/30/2022 CLINICAL DATA:  Nonlocalized abdominal pain. EXAM: CT ABDOMEN AND PELVIS WITH CONTRAST TECHNIQUE: Multidetector CT imaging of the abdomen and pelvis was performed using the standard protocol following bolus administration of intravenous contrast. RADIATION DOSE  REDUCTION: This exam was performed according to the departmental dose-optimization program which includes automated exposure control, adjustment of the mA and/or kV according to patient size and/or use of iterative reconstruction technique. CONTRAST:  179m OMNIPAQUE IOHEXOL 350 MG/ML SOLN COMPARISON:  Abdomen/pelvis CT 04/07/2019 FINDINGS: Lower chest: Please see report for chest CTA performed at the same time. Hepatobiliary: No suspicious focal abnormality within the liver parenchyma. Gallbladder is surgically absent. Pneumobilia suggests prior sphincterotomy. Pancreas: No focal mass lesion. No dilatation of the main duct. No intraparenchymal cyst. No peripancreatic edema. Spleen: No splenomegaly. No focal mass lesion. Adrenals/Urinary Tract: Stable 14 mm left adrenal nodule consistent with adenoma. No followup recommended. Right adrenal gland unremarkable. Innumerable small cystic lesions identified in the right kidney, the more [place] which are too small to characterize on this exam. Innumerable cystic foci are seen in the left kidney with a large perinephric heterogeneous collection compatible with hematoma. Hematoma is probably subcapsular and measures 14.9 x 6.4 x 17.3 cm. Foci of increased attenuation in the interpolar left kidney between the parenchyma and the hematoma (axial 46/1 and coronal 87/6) raise the question of active extravasation. There is stranding around the left kidney compatible with edema/hemorrhage. Hemorrhage extends down the retroperitoneum into the left pelvic sidewall. Hematoma generates mass-effect on the left kidney. No hydroureteronephrosis. Bladder is decompressed. Stomach/Bowel: Tiny hiatal hernia. Stomach otherwise unremarkable. Duodenum is normally positioned as is the ligament of Treitz. No small bowel wall thickening. No small bowel dilatation. The terminal ileum is normal. The appendix is normal. No gross colonic mass.  No colonic wall thickening. Vascular/Lymphatic: There  is advanced atherosclerotic calcification of the abdominal aorta without aneurysm. There is no gastrohepatic or hepatoduodenal ligament lymphadenopathy. No retroperitoneal or mesenteric lymphadenopathy. No pelvic sidewall lymphadenopathy. Reproductive: The prostate gland and seminal vesicles are unremarkable. Other: No substantial intraperitoneal free fluid. Musculoskeletal: No worrisome lytic or sclerotic osseous abnormality. Bilateral pars interarticularis defects noted at L5. IMPRESSION: 1. Markedly large left perinephric/subcapsular hematoma involving the left kidney. There are adjacent foci of ill-defined increased attenuation at the margin of the kidney and the hematoma raising concern for active or ongoing bleeding. Hemorrhage extends in the retroperitoneal tissues inferiorly to the extraperitoneal left pelvic sidewall. Follow-up recommended to exclude hemorrhage secondary to underlying mass lesion. 2. Innumerable tiny cystic lesions in both kidneys, too small to characterize and progressive in the interval since 2020. 3. Pneumobilia suggests prior sphincterotomy. 4. 14 mm left adrenal adenoma. Critical Value/emergent results were called by telephone at the time of interpretation on 05/30/2022 at 12:14 pm to provider [facility] , who verbally acknowledged these results. Electronically Signed   By: Misty Stanley M.D.   On: 05/30/2022 12:15   CT Angio Chest PE W and/or Wo Contrast  Result Date: 05/30/2022 CLINICAL DATA:  Nonlocalized abdominal pain. EXAM: CT ABDOMEN AND PELVIS WITH CONTRAST TECHNIQUE: Multidetector CT imaging of the abdomen and pelvis was performed using the standard protocol following bolus administration of intravenous contrast. RADIATION DOSE REDUCTION: This exam was performed according to the departmental dose-optimization program which includes automated exposure control, adjustment of the mA and/or kV according to patient size and/or use of iterative reconstruction technique. CONTRAST:   175m OMNIPAQUE IOHEXOL 350 MG/ML SOLN COMPARISON:  Abdomen/pelvis CT 04/07/2019 FINDINGS: Lower chest: Please see report for chest CTA performed at the same time. Hepatobiliary: No suspicious focal abnormality within the liver parenchyma. Gallbladder is surgically absent. Pneumobilia suggests prior sphincterotomy. Pancreas: No focal mass lesion. No dilatation of the main duct. No intraparenchymal cyst. No peripancreatic edema. Spleen: No splenomegaly. No focal mass lesion. Adrenals/Urinary Tract: Stable 14 mm left adrenal nodule consistent with adenoma. No followup recommended. Right adrenal gland unremarkable. Innumerable small cystic lesions identified in the right kidney, the more [place] which are too small to characterize on this exam. Innumerable cystic foci are seen in the left kidney with a large perinephric heterogeneous collection compatible with hematoma. Hematoma is probably subcapsular and measures 14.9 x 6.4 x 17.3 cm. Foci of increased attenuation in the interpolar left kidney between the parenchyma and the hematoma (axial 46/1 and coronal 87/6) raise the question of active extravasation. There is stranding around the left kidney compatible with edema/hemorrhage. Hemorrhage extends down the retroperitoneum into the left pelvic sidewall. Hematoma generates mass-effect on the left kidney. No hydroureteronephrosis. Bladder is decompressed. Stomach/Bowel: Tiny hiatal hernia. Stomach otherwise unremarkable. Duodenum is normally positioned as is the ligament of Treitz. No small bowel wall thickening. No small bowel dilatation. The terminal ileum is normal. The appendix is normal. No gross colonic mass. No colonic wall thickening. Vascular/Lymphatic: There is advanced atherosclerotic calcification of the abdominal aorta without aneurysm. There is no gastrohepatic or hepatoduodenal ligament lymphadenopathy. No retroperitoneal or mesenteric lymphadenopathy. No pelvic sidewall lymphadenopathy. Reproductive:  The prostate gland and seminal vesicles are unremarkable. Other: No substantial intraperitoneal free fluid. Musculoskeletal: No worrisome lytic or sclerotic osseous abnormality. Bilateral pars interarticularis defects noted at L5. IMPRESSION: 1. Markedly large left perinephric/subcapsular hematoma involving the left kidney. There are adjacent foci of ill-defined increased attenuation at the margin of the kidney  and the hematoma raising concern for active or ongoing bleeding. Hemorrhage extends in the retroperitoneal tissues inferiorly to the extraperitoneal left pelvic sidewall. Follow-up recommended to exclude hemorrhage secondary to underlying mass lesion. 2. Innumerable tiny cystic lesions in both kidneys, too small to characterize and progressive in the interval since 2020. 3. Pneumobilia suggests prior sphincterotomy. 4. 14 mm left adrenal adenoma. Critical Value/emergent results were called by telephone at the time of interpretation on 05/30/2022 at 12:14 pm to provider [facility] , who verbally acknowledged these results. Electronically Signed   By: Misty Stanley M.D.   On: 05/30/2022 12:15   DG Chest 1 View  Result Date: 05/30/2022 CLINICAL DATA:  Questionable sepsis. EXAM: CHEST  1 VIEW COMPARISON:  01/02/2022 FINDINGS: Stable cardiomediastinal contours. Lung volumes are low. No pleural effusion or edema. No airspace opacities identified. Visualized osseous structures appear intact. IMPRESSION: Low lung volumes. Electronically Signed   By: Kerby Moors M.D.   On: 05/30/2022 11:53    Anti-infectives (From admission, onward)    None       Assessment/Plan Fall  Left perinephric/subcapsular hematoma with possible active extravasation and hemorrhagic shock - CT with above - hgb 7.5 and hypotensive, 1 unit PRBC ordered by EDP. Will order 2 more units. Baseline hgb appears around 13-14 - will consult IR for possible embolization of L kidney and consult urology as well Abrasions - local wound  care  FEN - NPO, IVF VTE - SCDs ok, no anticoagulation in setting of hemorrhagic shock ID - no current abx Dispo - Admit to CCM and trauma will consult  ESRD on HD T/TR/Sat HTN COPD CAD A. Fib Hypothyroidism T2DM Depression   I reviewed ED provider notes, last 24 h vitals and pain scores, last 48 h intake and output, last 24 h labs and trends, last 24 h imaging results, and discussed with IR, awaiting call back from urology .    Barkley Boards, [facility] Surgery 05/30/2022, 12:35 PM Please see Amion for pager number during day hours 7:00am-4:30pm

## 2022-05-30 NOTE — Consult Note (Signed)
Chief Complaint: Patient was seen in consultation today for perinephric hematoma  Referring Physician(s): Dr. Georganna Skeans  Supervising Physician: Sandi Mariscal  Patient Status: [facility] - ED  History of Present Illness: Christian Dalton is a 81 y.o. male with past medical history of ESRD on HD, PAD with chronic LLE ulcers (on aspirin '81mg'$  daily), COPD, HTN, a fib (not on anticoagulation) who presented to [facility] ED after fall at home yesterday.  Wife brought him to the ED due to lightheadedness and abdominal pain.    CTA Abdomen Pelvis showed: 1. Markedly large left perinephric/subcapsular hematoma involving the left kidney. There are adjacent foci of ill-defined increased attenuation at the margin of the kidney and the hematoma raising concern for active or ongoing bleeding. Hemorrhage extends in the retroperitoneal tissues inferiorly to the extraperitoneal left pelvic sidewall. Follow-up recommended to exclude hemorrhage secondary to underlying mass lesion. 2. Innumerable tiny cystic lesions in both kidneys, too small to characterize and progressive in the interval since 2020. 3. Pneumobilia suggests prior sphincterotomy. 4. 14 mm left adrenal adenoma.  Patient lying in bed.  Accompanied by wife.  He does arouse and ask for water, but wife feels he is confused even more so than usual. She is agreeable to angiogram with embolization.   Past Medical History:  Diagnosis Date   Acute cholecystitis 08/17/2018   Anemia    Arthritis    Atrial fibrillation (HCC)    CAD (coronary artery disease)    Cholecystitis    COPD (chronic obstructive pulmonary disease) (HCC)    Depression    Diabetes mellitus without complication (HCC)    ESRD (end stage renal disease) on dialysis (HCC)    HOH (hard of hearing)    Hypertension    Hypothyroidism    OSA (obstructive sleep apnea)    does not wear cpap   Spinal stenosis    Wears dentures    wears top dentures   Wears glasses    Wears hearing  aid in both ears     Past Surgical History:  Procedure Laterality Date   ABDOMINAL AORTOGRAM N/A 01/15/2021   Procedure: ABDOMINAL AORTOGRAM;  Surgeon: Serafina Mitchell, MD;  Location: [place] [facility];  Service: Cardiovascular;  Laterality: N/A;   ABDOMINAL AORTOGRAM W/LOWER EXTREMITY Bilateral 12/11/2020   Procedure: ABDOMINAL AORTOGRAM W/LOWER EXTREMITY;  Surgeon: Serafina Mitchell, MD;  Location: [place] [facility];  Service: Cardiovascular;  Laterality: Bilateral;   ABDOMINAL AORTOGRAM W/LOWER EXTREMITY N/A 03/17/2022   Procedure: ABDOMINAL AORTOGRAM W/LOWER EXTREMITY;  Surgeon: Waynetta Sandy, MD;  Location: [place] [facility];  Service: Cardiovascular;  Laterality: N/A;   BASCILIC VEIN TRANSPOSITION Left 12/02/2019   Procedure: BASCILIC VEIN TRANSPOSITION LEFT FIRST STAGE;  Surgeon: Serafina Mitchell, MD;  Location: [place];  Service: Vascular;  Laterality: Left;   [place] Left 02/10/2020   Procedure: BASCILIC VEIN TRANSPOSITION LEFT SECOND STAGE;  Surgeon: Serafina Mitchell, MD;  Location: [place];  Service: Vascular;  Laterality: Left;   BILIARY STENT PLACEMENT  11/25/2018   Procedure: BILIARY STENT PLACEMENT;  Surgeon: [name], MD;  Location: [facility] ENDOSCOPY;  Service: Gastroenterology;;   BIOPSY  12/20/2018   Procedure: BIOPSY;  Surgeon: [name], MD;  Location: [facility] ENDOSCOPY;  Service: Gastroenterology;;   BIOPSY  08/03/2020   Procedure: BIOPSY;  Surgeon: Otis Brace, MD;  Location: [facility] ENDOSCOPY;  Service: Gastroenterology;;  EGD and COLON   CARDIAC CATHETERIZATION     CHOLECYSTECTOMY N/A 11/14/2018   Procedure: LAPAROSCOPIC  CHOLECYSTECTOMY WITH INTRAOPERATIVE CHOLANGIOGRAM;  Surgeon: Coralie Keens, MD;  Location: [place];  Service: General;  Laterality: N/A;   COLONOSCOPY W/ BIOPSIES AND POLYPECTOMY     COLONOSCOPY WITH PROPOFOL N/A 08/03/2020   Procedure: COLONOSCOPY WITH PROPOFOL;  Surgeon: Otis Brace, MD;  Location: [facility] ENDOSCOPY;  Service:  Gastroenterology;  Laterality: N/A;   ERCP N/A 11/25/2018   Procedure: ENDOSCOPIC RETROGRADE CHOLANGIOPANCREATOGRAPHY (ERCP);  Surgeon: [name], MD;  Location: [place];  Service: Gastroenterology;  Laterality: N/A;   ESOPHAGOGASTRODUODENOSCOPY N/A 12/20/2018   Procedure: ESOPHAGOGASTRODUODENOSCOPY (EGD);  Surgeon: [name], MD;  Location: [facility];  Service: Gastroenterology;  Laterality: N/A;   ESOPHAGOGASTRODUODENOSCOPY (EGD) WITH PROPOFOL N/A 08/03/2020   Procedure: ESOPHAGOGASTRODUODENOSCOPY (EGD) WITH PROPOFOL;  Surgeon: Otis Brace, MD;  Location: [facility]OS[facility];  Service: Gastroenterology;  Laterality: N/A;   EYE SURGERY     cataract B/L   HAND SURGERY Right    IR DIALY SHUNT INTRO NEEDLE/INTRACATH INITIAL W/IMG LEFT Left 08/20/2018   IR PERC CHOLECYSTOSTOMY  08/19/2018   IR RADIOLOGIST EVAL & MGMT  09/29/2018   LOWER EXTREMITY ANGIOGRAPHY Left 01/15/2021   Procedure: Lower Extremity Angiography;  Surgeon: Serafina Mitchell, MD;  Location: [place] [facility];  Service: Cardiovascular;  Laterality: Left;   MULTIPLE TOOTH EXTRACTIONS     PERIPHERAL VASCULAR BALLOON ANGIOPLASTY Right 12/11/2020   Procedure: PERIPHERAL VASCULAR BALLOON ANGIOPLASTY;  Surgeon: Serafina Mitchell, MD;  Location: [place] [facility];  Service: Cardiovascular;  Laterality: Right;   PERIPHERAL VASCULAR BALLOON ANGIOPLASTY Left 01/15/2021   Procedure: PERIPHERAL VASCULAR BALLOON ANGIOPLASTY;  Surgeon: Serafina Mitchell, MD;  Location: [place] [facility];  Service: Cardiovascular;  Laterality: Left;  PT   POLYPECTOMY  08/03/2020   Procedure: POLYPECTOMY;  Surgeon: Otis Brace, MD;  Location: [facility] ENDOSCOPY;  Service: Gastroenterology;;   RESECTION OF ARTERIOVENOUS FISTULA ANEURYSM Left 10/27/2019   Procedure: Resection Of Left Upper Arm  Arteriovenous Fistula Aneurysm Times Two;  Surgeon: Serafina Mitchell, MD;  Location: [place];  Service: Vascular;  Laterality: Left;   SHOULDER SURGERY Right     SPHINCTEROTOMY  11/25/2018   Procedure: SPHINCTEROTOMY;  Surgeon: [name], MD;  Location: [facility] ENDOSCOPY;  Service: Gastroenterology;;   Lavell Islam REMOVAL  12/20/2018   Procedure: STENT REMOVAL;  Surgeon: [name], MD;  Location: [facility] ENDOSCOPY;  Service: Gastroenterology;;   TIBIA FRACTURE SURGERY      Allergies: Avelox [moxifloxacin hcl], Gabapentin, Metoprolol, Quinolones, Shellfish allergy, Sulfa antibiotics, Tetracyclines & related, and Adhesive [tape]  Medications: Prior to Admission medications   Medication Sig Start Date End Date Taking? Authorizing Provider  aspirin EC 81 MG tablet Take 81 mg by mouth at bedtime.     [provider]  atorvastatin (LIPITOR) 10 MG tablet Take 10 mg by mouth at bedtime.    [provider]  buPROPion (WELLBUTRIN XL) 150 MG 24 hr tablet Take 150 mg by mouth every evening.    [provider]  docusate sodium (COLACE) 100 MG capsule Take 100 mg by mouth 2 (two) times daily.     [provider]  escitalopram (LEXAPRO) 10 MG tablet Take 10 mg by mouth at bedtime.  08/30/15   [provider]  fluticasone (FLONASE) 50 MCG/ACT nasal spray Place 1 spray into both nostrils daily as needed for allergies or rhinitis.    [provider]  lactulose, encephalopathy, (CHRONULAC) 10 GM/15ML SOLN Take 10 g by mouth every evening. 08/02/18   [provider]  levothyroxine (SYNTHROID, LEVOTHROID) 125 MCG  tablet Take 125 mcg by mouth daily before breakfast.    [provider]  lidocaine-prilocaine (EMLA) cream Apply 1 application topically Every Tuesday,Thursday,and Saturday with dialysis. 08/02/18   [provider]  linaclotide (LINZESS) 290 MCG CAPS capsule Take 290 mcg by mouth every evening.     [provider]  loratadine (CLARITIN) 10 MG tablet Take 10 mg by mouth daily.     [provider]  montelukast (SINGULAIR) 10 MG tablet Take 10 mg every evening by mouth.    [provider]  nitroGLYCERIN (NITROSTAT) 0.4 MG SL tablet Place 0.4 mg under the tongue every 5 (five) minutes as needed for chest pain.    [provider]  oxyCODONE-acetaminophen (PERCOCET) 10-325 MG tablet Take 1 tablet by mouth every 4 (four) hours as needed for pain. 12/09/18   [provider]  PROAIR HFA 108 (90 Base) MCG/ACT inhaler Inhale 2 puffs into the lungs every 6 (six) hours as needed for shortness of breath. 05/27/20   [provider]  Propylene Glycol (SYSTANE BALANCE) 0.6 % SOLN Place 1 drop into both eyes as needed (dry eyes).    [provider]  sevelamer carbonate (RENVELA) 800 MG tablet Take 3 tablets (2,400 mg total) by mouth 3 (three) times daily with meals. Patient taking differently: Take 2,400 mg by mouth 2 (two) times daily with a meal. 08/24/18   Elgergawy, Silver Huguenin, MD     Family History  Problem Relation Age of Onset   Heart disease Mother    Heart disease Father    Heart disease Sister     Social History   Socioeconomic History   Marital status: Married    Spouse name: Di Kindle   Number of children: Not on file   Years of education: Not on file   Highest education level: Not on file  Occupational History   Not on file  Tobacco Use   Smoking status: Never   Smokeless tobacco: Former    Types: Nurse, children's Use: Never used  Substance and Sexual Activity   Alcohol use: Not Currently   Drug use: No   Sexual activity: Not on file  Other Topics Concern   Not on file  Social History Narrative   Not on file   Social Determinants of Health   Financial Resource Strain: Low Risk  (08/17/2018)   Overall Financial Resource Strain (CARDIA)    Difficulty of Paying Living Expenses: Not hard at all  Food Insecurity: Unknown (08/17/2018)   Hunger Vital Sign    Worried About [place] in the Last Year: Patient refused    [place] in the Last Year: Patient refused  Transportation Needs: Unknown  (08/17/2018)   [place] - Transportation    Lack of Transportation (Medical): Patient refused    Lack of Transportation (Non-Medical): Patient refused  Physical Activity: Unknown (08/17/2018)   Exercise Vital Sign    Days of Exercise per Week: Patient refused    Minutes of Exercise per Session: Patient refused  Stress: No Stress Concern Present (08/17/2018)   [place]    Feeling of Stress : Only a little  Social Connections: Not on file     Review of Systems: A 12 point ROS discussed and pertinent positives are indicated in the HPI above.  All other systems are negative.  Review of Systems  Unable to perform ROS: Mental status change  Vital Signs: BP (!) 85/52   Pulse (!) 102   Temp 98.5 F (36.9 C) (Oral)   Resp (!) 26   Ht '5\' 10"'$  (1.778 m)   Wt 202 lb 13.2 oz (92 kg)   SpO2 100%   BMI 29.10 kg/m   Physical Exam Vitals and nursing note reviewed.  Constitutional:      General: He is not in acute distress.    Appearance: Normal appearance. He is ill-appearing.  HENT:     Mouth/Throat:     Mouth: Mucous membranes are moist.     Pharynx: Oropharynx is clear.  Cardiovascular:     Rate and Rhythm: Tachycardia present.  Pulmonary:     Effort: Pulmonary effort is normal.     Breath sounds: Normal breath sounds.  Abdominal:     Palpations: Abdomen is soft.     Tenderness: There is guarding (minimal left side).  Skin:    Coloration: Skin is pale.  Neurological:     Mental Status: He is alert. He is disoriented.  Psychiatric:        Mood and Affect: Mood normal.        Behavior: Behavior normal.      MD Evaluation Airway: WNL Heart: WNL Abdomen: WNL Chest/ Lungs: WNL ASA  Classification: 3 Mallampati/Airway Score: Two   Imaging: CT ABDOMEN PELVIS W CONTRAST  Result Date: 05/30/2022 CLINICAL DATA:  Nonlocalized abdominal pain. EXAM: CT ABDOMEN AND PELVIS WITH CONTRAST TECHNIQUE:  Multidetector CT imaging of the abdomen and pelvis was performed using the standard protocol following bolus administration of intravenous contrast. RADIATION DOSE REDUCTION: This exam was performed according to the departmental dose-optimization program which includes automated exposure control, adjustment of the mA and/or kV according to patient size and/or use of iterative reconstruction technique. CONTRAST:  138m OMNIPAQUE IOHEXOL 350 MG/ML SOLN COMPARISON:  Abdomen/pelvis CT 04/07/2019 FINDINGS: Lower chest: Please see report for chest CTA performed at the same time. Hepatobiliary: No suspicious focal abnormality within the liver parenchyma. Gallbladder is surgically absent. Pneumobilia suggests prior sphincterotomy. Pancreas: No focal mass lesion. No dilatation of the main duct. No intraparenchymal cyst. No peripancreatic edema. Spleen: No splenomegaly. No focal mass lesion. Adrenals/Urinary Tract: Stable 14 mm left adrenal nodule consistent with adenoma. No followup recommended. Right adrenal gland unremarkable. Innumerable small cystic lesions identified in the right kidney, the more [place] which are too small to characterize on this exam. Innumerable cystic foci are seen in the left kidney with a large perinephric heterogeneous collection compatible with hematoma. Hematoma is probably subcapsular and measures 14.9 x 6.4 x 17.3 cm. Foci of increased attenuation in the interpolar left kidney between the parenchyma and the hematoma (axial 46/1 and coronal 87/6) raise the question of active extravasation. There is stranding around the left kidney compatible with edema/hemorrhage. Hemorrhage extends down the retroperitoneum into the left pelvic sidewall. Hematoma generates mass-effect on the left kidney. No hydroureteronephrosis. Bladder is decompressed. Stomach/Bowel: Tiny hiatal hernia. Stomach otherwise unremarkable. Duodenum is normally positioned as is the ligament of Treitz. No small bowel wall  thickening. No small bowel dilatation. The terminal ileum is normal. The appendix is normal. No gross colonic mass. No colonic wall thickening. Vascular/Lymphatic: There is advanced atherosclerotic calcification of the abdominal aorta without aneurysm. There is no gastrohepatic or hepatoduodenal ligament lymphadenopathy. No retroperitoneal or mesenteric lymphadenopathy. No pelvic sidewall lymphadenopathy. Reproductive: The prostate gland and seminal vesicles are unremarkable. Other: No substantial intraperitoneal free fluid. Musculoskeletal: No worrisome lytic or sclerotic osseous abnormality. Bilateral  pars interarticularis defects noted at L5. IMPRESSION: 1. Markedly large left perinephric/subcapsular hematoma involving the left kidney. There are adjacent foci of ill-defined increased attenuation at the margin of the kidney and the hematoma raising concern for active or ongoing bleeding. Hemorrhage extends in the retroperitoneal tissues inferiorly to the extraperitoneal left pelvic sidewall. Follow-up recommended to exclude hemorrhage secondary to underlying mass lesion. 2. Innumerable tiny cystic lesions in both kidneys, too small to characterize and progressive in the interval since 2020. 3. Pneumobilia suggests prior sphincterotomy. 4. 14 mm left adrenal adenoma. Critical Value/emergent results were called by telephone at the time of interpretation on 05/30/2022 at 12:14 pm to provider [facility] , who verbally acknowledged these results. Electronically Signed   By: Misty Stanley M.D.   On: 05/30/2022 12:15   CT Angio Chest PE W and/or Wo Contrast  Result Date: 05/30/2022 CLINICAL DATA:  Nonlocalized abdominal pain. EXAM: CT ABDOMEN AND PELVIS WITH CONTRAST TECHNIQUE: Multidetector CT imaging of the abdomen and pelvis was performed using the standard protocol following bolus administration of intravenous contrast. RADIATION DOSE REDUCTION: This exam was performed according to the departmental  dose-optimization program which includes automated exposure control, adjustment of the mA and/or kV according to patient size and/or use of iterative reconstruction technique. CONTRAST:  188m OMNIPAQUE IOHEXOL 350 MG/ML SOLN COMPARISON:  Abdomen/pelvis CT 04/07/2019 FINDINGS: Lower chest: Please see report for chest CTA performed at the same time. Hepatobiliary: No suspicious focal abnormality within the liver parenchyma. Gallbladder is surgically absent. Pneumobilia suggests prior sphincterotomy. Pancreas: No focal mass lesion. No dilatation of the main duct. No intraparenchymal cyst. No peripancreatic edema. Spleen: No splenomegaly. No focal mass lesion. Adrenals/Urinary Tract: Stable 14 mm left adrenal nodule consistent with adenoma. No followup recommended. Right adrenal gland unremarkable. Innumerable small cystic lesions identified in the right kidney, the more [place] which are too small to characterize on this exam. Innumerable cystic foci are seen in the left kidney with a large perinephric heterogeneous collection compatible with hematoma. Hematoma is probably subcapsular and measures 14.9 x 6.4 x 17.3 cm. Foci of increased attenuation in the interpolar left kidney between the parenchyma and the hematoma (axial 46/1 and coronal 87/6) raise the question of active extravasation. There is stranding around the left kidney compatible with edema/hemorrhage. Hemorrhage extends down the retroperitoneum into the left pelvic sidewall. Hematoma generates mass-effect on the left kidney. No hydroureteronephrosis. Bladder is decompressed. Stomach/Bowel: Tiny hiatal hernia. Stomach otherwise unremarkable. Duodenum is normally positioned as is the ligament of Treitz. No small bowel wall thickening. No small bowel dilatation. The terminal ileum is normal. The appendix is normal. No gross colonic mass. No colonic wall thickening. Vascular/Lymphatic: There is advanced atherosclerotic calcification of the abdominal aorta  without aneurysm. There is no gastrohepatic or hepatoduodenal ligament lymphadenopathy. No retroperitoneal or mesenteric lymphadenopathy. No pelvic sidewall lymphadenopathy. Reproductive: The prostate gland and seminal vesicles are unremarkable. Other: No substantial intraperitoneal free fluid. Musculoskeletal: No worrisome lytic or sclerotic osseous abnormality. Bilateral pars interarticularis defects noted at L5. IMPRESSION: 1. Markedly large left perinephric/subcapsular hematoma involving the left kidney. There are adjacent foci of ill-defined increased attenuation at the margin of the kidney and the hematoma raising concern for active or ongoing bleeding. Hemorrhage extends in the retroperitoneal tissues inferiorly to the extraperitoneal left pelvic sidewall. Follow-up recommended to exclude hemorrhage secondary to underlying mass lesion. 2. Innumerable tiny cystic lesions in both kidneys, too small to characterize and progressive in the interval since 2020. 3. Pneumobilia suggests prior sphincterotomy. 4.  14 mm left adrenal adenoma. Critical Value/emergent results were called by telephone at the time of interpretation on 05/30/2022 at 12:14 pm to provider [facility] , who verbally acknowledged these results. Electronically Signed   By: Misty Stanley M.D.   On: 05/30/2022 12:15   DG Chest 1 View  Result Date: 05/30/2022 CLINICAL DATA:  Questionable sepsis. EXAM: CHEST  1 VIEW COMPARISON:  01/02/2022 FINDINGS: Stable cardiomediastinal contours. Lung volumes are low. No pleural effusion or edema. No airspace opacities identified. Visualized osseous structures appear intact. IMPRESSION: Low lung volumes. Electronically Signed   By: Kerby Moors M.D.   On: 05/30/2022 11:53   VAS [place] ABI WITH/WO TBI  Result Date: 05/21/2022  LOWER EXTREMITY DOPPLER STUDY Patient Name:  TYKEEM LANZER  Date of Exam:   05/21/2022 Medical Rec #: [id number]     Accession #:    [id number] Date of Birth: 1941/08/22     Patient Gender: M  Patient Age:   49 years Exam Location:  [facility] Procedure:      VAS [place] ABI WITH/WO TBI Referring Phys: Aldona Bar RHYNE --------------------------------------------------------------------------------  Indications: Ulceration, and peripheral artery disease. High Risk Factors: Hypertension, Diabetes, coronary artery disease.  Vascular Interventions: 01/15/2021: Balloon angioplasty of left posterior tibial                         artery.                          12/11/2020: Angioplasty of right peroneal artery. Failed                         retrograde angioplasty of right anterior tibial artery. Comparison Study: 12/30/2021: Rt ABI [place]; Lt ABI [place] Performing Technologist: Ivan Croft  Examination Guidelines: A complete evaluation includes at minimum, Doppler waveform signals and systolic blood pressure reading at the level of bilateral brachial, anterior tibial, and posterior tibial arteries, when vessel segments are accessible. Bilateral testing is considered an integral part of a complete examination. Photoelectric Plethysmograph (PPG) waveforms and toe systolic pressure readings are included as required and additional duplex testing as needed. Limited examinations for reoccurring indications may be performed as noted.  ABI Findings: +---------+------------------+-----+----------+--------+ Right    Rt Pressure (mmHg)IndexWaveform  Comment  +---------+------------------+-----+----------+--------+ Brachial 133                                       +---------+------------------+-----+----------+--------+ PTA      [place]                     biphasic           +---------+------------------+-----+----------+--------+ DP       [place]                     monophasic         +---------+------------------+-----+----------+--------+ Great Toe31                0.23                    +---------+------------------+-----+----------+--------+  +---------+------------------+-----+---------+---------+ Left     Lt Pressure (mmHg)IndexWaveform Comment   +---------+------------------+-----+---------+---------+ Brachial  HD access +---------+------------------+-----+---------+---------+ PTA      [place]                     triphasic          +---------+------------------+-----+---------+---------+ DP       [place]                     biphasic           +---------+------------------+-----+---------+---------+ Great Toe21                0.16                    +---------+------------------+-----+---------+---------+ +-------+-----------+-----------+------------+------------+ ABI/TBIToday's ABIToday's TBIPrevious ABIPrevious TBI +-------+-----------+-----------+------------+------------+ Right  [place]         0.23                                +-------+-----------+-----------+------------+------------+ Left   [place]         0.16                                +-------+-----------+-----------+------------+------------+   Summary: Right: Resting right ankle-brachial index indicates noncompressible right lower extremity arteries. The right toe-brachial index is abnormal. Left: Resting left ankle-brachial index indicates noncompressible left lower extremity arteries. The left toe-brachial index is abnormal. *See table(s) above for measurements and observations.  Electronically signed by Servando Snare MD on 05/21/2022 at 1:58:44 PM.    Final     Labs:  CBC: Recent Labs    03/10/22 1007 03/17/22 1046 05/30/22 1051 05/30/22 1107  WBC  --   --  9.1  --   HGB 14.3 13.9 7.5* 8.2*  HCT 42.0 41.0 24.7* 24.0*  PLT  --   --  225  --     COAGS: Recent Labs    05/30/22 1051  INR 1.4*  APTT 36    BMP: Recent Labs    03/10/22 1007 03/17/22 1046 05/30/22 1051 05/30/22 1107  NA 133* 135 136 133*  K 4.6 4.6 5.3* 5.2*  CL 95* 94* 94* 94*  CO2  --   --  20*  --   GLUCOSE 145* 139* 265* 255*   BUN 52* 52* 46* 41*  CALCIUM  --   --  8.7*  --   CREATININE 9.70* 9.60* 8.61* 9.00*  GFRNONAA  --   --  6*  --     LIVER FUNCTION TESTS: Recent Labs    05/30/22 1051  BILITOT 0.6  AST 12*  ALT 9  ALKPHOS 70  PROT 4.9*  ALBUMIN 2.7*    TUMOR MARKERS: No results for input(s): "AFPTM", "CEA", "CA199", "CHROMGRNA" in the last 8760 hours.  Assessment and Plan: Large left perinephric/subcapsular hematoma of the left kidney Patient with chronic renal disease on HD admitted with lightheadness, confusion, abdominal pain at home after a ground-level fall.  Hemoglobin on admission 7.5.  Getting 2u PRBC now.  Hypotensive in the ED-- BP 76-84/53.  MAPs adequate, getting fluid and blood product resusitation.  Case reviewed by Dr. Pascal Lux for angiogram with embolization who approves patient for the procedure.  Wife present at bedside and provides consent.   The Risks and benefits of embolization were discussed with the patient including, but not limited to bleeding, infection, vascular injury, post operative pain, or contrast induced renal failure.  This  procedure involves the use of X-rays and because of the nature of the planned procedure, it is possible that we will have prolonged use of X-ray fluoroscopy.  Potential radiation risks to you include (but are not limited to) the following: - A slightly elevated risk for cancer several years later in life. This risk is typically less than 0.5% percent. This risk is low in comparison to the normal incidence of human cancer, which is 33% for women and 50% for men according to the [place]. - Radiation induced injury can include skin redness, resembling a rash, tissue breakdown / ulcers and hair loss (which can be temporary or permanent).   The likelihood of either of these occurring depends on the difficulty of the procedure and whether you are sensitive to radiation due to previous procedures, disease, or genetic conditions.    IF your procedure requires a prolonged use of radiation, you will be notified and given written instructions for further action.  It is your responsibility to monitor the irradiated area for the 2 weeks following the procedure and to notify your physician if you are concerned that you have suffered a radiation induced injury.    All of the patient's questions were answered, patient is agreeable to proceed. Consent signed and in chart.  Thank you for this interesting consult.  I greatly enjoyed meeting Christian Dalton and look forward to participating in their care.  A copy of this report was sent to the requesting provider on this date.  Electronically Signed: Docia Barrier, PA 05/30/2022, 2:03 PM   I spent a total of 40 Minutes    in face to face in clinical consultation, greater than 50% of which was counseling/coordinating care for perinephric hematoma.

## 2022-05-30 NOTE — Sedation Documentation (Signed)
Transported patient with 2 nurses and with wife to [place]. Assessed right groin with primary nurse dressing intact no hematoma.

## 2022-05-31 DIAGNOSIS — D62 Acute posthemorrhagic anemia: Secondary | ICD-10-CM | POA: Diagnosis not present

## 2022-05-31 LAB — HEPATITIS C ANTIBODY: HCV Ab: NONREACTIVE

## 2022-05-31 LAB — RENAL FUNCTION PANEL
Albumin: 2.5 g/dL — ABNORMAL LOW (ref 3.5–5.0)
Anion gap: 18 — ABNORMAL HIGH (ref 5–15)
BUN: 52 mg/dL — ABNORMAL HIGH (ref 8–23)
CO2: 24 mmol/L (ref 22–32)
Calcium: 8.7 mg/dL — ABNORMAL LOW (ref 8.9–10.3)
Chloride: 93 mmol/L — ABNORMAL LOW (ref 98–111)
Creatinine, Ser: 9.01 mg/dL — ABNORMAL HIGH (ref 0.61–1.24)
GFR, Estimated: 5 mL/min — ABNORMAL LOW (ref >60)
Glucose, Bld: 175 mg/dL — ABNORMAL HIGH (ref 70–99)
Phosphorus: 7.4 mg/dL — ABNORMAL HIGH (ref 2.5–4.6)
Potassium: 5.5 mmol/L — ABNORMAL HIGH (ref 3.5–5.1)
Sodium: 135 mmol/L (ref 135–145)

## 2022-05-31 LAB — HEPATITIS B SURFACE ANTIGEN: Hepatitis B Surface Ag: NONREACTIVE

## 2022-05-31 LAB — BASIC METABOLIC PANEL
Anion gap: 19 — ABNORMAL HIGH (ref 5–15)
BUN: 40 mg/dL — ABNORMAL HIGH (ref 8–23)
CO2: 25 mmol/L (ref 22–32)
Calcium: 8.8 mg/dL — ABNORMAL LOW (ref 8.9–10.3)
Chloride: 93 mmol/L — ABNORMAL LOW (ref 98–111)
Creatinine, Ser: 6.72 mg/dL — ABNORMAL HIGH (ref 0.61–1.24)
GFR, Estimated: 8 mL/min — ABNORMAL LOW (ref >60)
Glucose, Bld: 142 mg/dL — ABNORMAL HIGH (ref 70–99)
Potassium: 4.2 mmol/L (ref 3.5–5.1)
Sodium: 137 mmol/L (ref 135–145)

## 2022-05-31 LAB — GLUCOSE, CAPILLARY
Glucose-Capillary: 167 mg/dL — ABNORMAL HIGH (ref 70–99)
Glucose-Capillary: 152 mg/dL — ABNORMAL HIGH (ref 70–99)
Glucose-Capillary: 101 mg/dL — ABNORMAL HIGH (ref 70–99)
Glucose-Capillary: 137 mg/dL — ABNORMAL HIGH (ref 70–99)
Glucose-Capillary: 147 mg/dL — ABNORMAL HIGH (ref 70–99)
Glucose-Capillary: 151 mg/dL — ABNORMAL HIGH (ref 70–99)

## 2022-05-31 LAB — HEMOGLOBIN AND HEMATOCRIT, BLOOD
HCT: 29.5 % — ABNORMAL LOW (ref 39.0–52.0)
Hemoglobin: 9.6 g/dL — ABNORMAL LOW (ref 13.0–17.0)

## 2022-05-31 LAB — CBC
HCT: 35.5 % — ABNORMAL LOW (ref 39.0–52.0)
Hemoglobin: 11.4 g/dL — ABNORMAL LOW (ref 13.0–17.0)
MCH: 30.2 pg (ref 26.0–34.0)
MCHC: 32.1 g/dL (ref 30.0–36.0)
MCV: 93.9 fL (ref 80.0–100.0)
Platelets: 77 10*3/uL — ABNORMAL LOW (ref 150–400)
RBC: 3.78 MIL/uL — ABNORMAL LOW (ref 4.22–5.81)
RDW: 13.8 % (ref 11.5–15.5)
WBC: 12 10*3/uL — ABNORMAL HIGH (ref 4.0–10.5)
nRBC: 0 % (ref 0.0–0.2)

## 2022-05-31 LAB — HEMOGLOBIN A1C
Hgb A1c MFr Bld: 6.4 % — ABNORMAL HIGH (ref 4.8–5.6)
Mean Plasma Glucose: 136.98 mg/dL

## 2022-05-31 LAB — HEPATITIS B SURFACE ANTIBODY,QUALITATIVE: Hep B S Ab: REACTIVE — AB

## 2022-05-31 LAB — HEPATITIS B CORE ANTIBODY, TOTAL: Hep B Core Total Ab: NONREACTIVE

## 2022-05-31 LAB — LACTIC ACID, PLASMA: Lactic Acid, Venous: 2.2 mmol/L (ref 0.5–1.9)

## 2022-05-31 MED ORDER — PHENOL 1.4 % MT LIQD
1.0000 | OROMUCOSAL | Status: DC | PRN
Start: 2022-05-31 — End: 2022-06-05
  Administered 2022-05-31 – 2022-06-04 (×2): 1 via OROMUCOSAL
  Filled 2022-05-31 (×2): qty 177

## 2022-05-31 MED ORDER — ALBUMIN HUMAN 25 % IV SOLN
INTRAVENOUS | Status: AC
  Administered 2022-05-31: 12.5 g via INTRAVENOUS_CENTRAL
  Filled 2022-05-31: qty 50

## 2022-05-31 MED ORDER — SODIUM ZIRCONIUM CYCLOSILICATE 10 G PO PACK
10.0000 g | PACK | Freq: Once | ORAL | Status: AC
  Administered 2022-05-31: 10 g via ORAL
  Filled 2022-05-31: qty 1

## 2022-05-31 NOTE — Progress Notes (Signed)
[facility] Progress Note Patient Name: Christian Dalton DOB: 12/09/1940 MRN: [id number]   Date of Service  05/31/2022  HPI/Events of Note  Hyperkalemia - K+ = 5.5.   eICU Interventions  Plan: Lokelma 10 gm PO X 1 now. Repeat BMP at 10 AM.     Intervention Category Major Interventions: Electrolyte abnormality - evaluation and management  Caius Silbernagel Eugene 05/31/2022, 4:27 AM

## 2022-05-31 NOTE — Consult Note (Signed)
[facility] Nurse Consult Note: Reason for Consult:Patient with known PAD, his of recent falls. Necrotic left great toe (patient scheduled for LLE amputation), right lateral foot with necrotic lesion. Abrasion to left forearm noted by surgery yesterday. See photos provided to EHR by Admitting MD. Wound type: PAD, trauma Pressure Injury POA: N/A Measurement: Left forearm: 7.5cm x 0.6 Right lateral foot: 2.5cm x 1.2cm with dry eschar Drainage (amount, consistency, odor) serosanguinous from forearm, other wounds are dry. Dressing procedure/placement/frequency: Patient has been seen regularly by both Vascular Surgery and Podiatric Medicine (Dr. Gretta Arab, [facility]). Conservative care orders are provided for the left forearm traumatic injury using NS cleanse, placement of antimicrobial nonadherent gauze (xeroform) topped with an ABD and secured with Kerlix roll gauze. Heels are to be floated using Prevalon Boots, turning and repositioning is in place and decease he is in the ICU, he is on a mattress replacement with low air loss feature.A sacral form is to be applied ot the patient for PI prevention.  Conservative care orders are provided for Nursing using a soap and water cleanse to the feet followed by applying a solution of povidone-iodine via moistened swabsticks  to the nonviable tissue, allowing it to dry and covering with dry dressings.  If desired, continued care by Vascular surgery and/or consultation with Podiatric medicine while in house is recommended.  [facility] nursing team will not follow, but will remain available to this patient, the nursing and medical teams.  Please re-consult if needed.  Thank you for inviting [place] to participate in this patient's Plan of Care.  Maudie Flakes, MSN, RN, CNS, [place], Serita Grammes, [employer], [employer] phone:  [phone]

## 2022-05-31 NOTE — Progress Notes (Signed)
Patient ID: Christian Dalton, male   DOB: 01-18-1941, 81 y.o.   MRN: [id number] S: Feeling better this morning. O:BP 108/69   Pulse (!) 102   Temp 97.6 F (36.4 C) (Oral)   Resp 19   Ht '5\' 10"'$  (1.778 m)   Wt 94.2 kg   SpO2 94%   BMI 29.80 kg/m   Intake/Output Summary (Last 24 hours) at 05/31/2022 0950 Last data filed at 05/31/2022 0900 Gross per 24 hour  Intake 1123.25 ml  Output --  Net 1123.25 ml   Intake/Output: I/O last 3 completed shifts: In: 1123.3 [P.O.:90; I.V.:27; Blood:1006.3] Out: -   Intake/Output this shift:  No intake/output data recorded. Weight change:  Gen: NAD CVS: Tachy at 102 Resp:CTA Abd: +BS, soft, NT/ND Ext: trace pretibial edema, LUE AVF +T/B, ischemic changes to left great toe  Recent Labs  Lab 05/30/22 1051 05/30/22 1107 05/31/22 0235  NA 136 133* 135  K 5.3* 5.2* 5.5*  CL 94* 94* 93*  CO2 20*  --  24  GLUCOSE 265* 255* 175*  BUN 46* 41* 52*  CREATININE 8.61* 9.00* 9.01*  ALBUMIN 2.7*  --  2.5*  CALCIUM 8.7*  --  8.7*  PHOS  --   --  7.4*  AST 12*  --   --   ALT 9  --   --    Liver Function Tests: Recent Labs  Lab 05/30/22 1051 05/31/22 0235  AST 12*  --   ALT 9  --   ALKPHOS 70  --   BILITOT 0.6  --   PROT 4.9*  --   ALBUMIN 2.7* 2.5*   Recent Labs  Lab 05/30/22 1051  LIPASE 18   No results for input(s): "AMMONIA" in the last 168 hours. CBC: Recent Labs  Lab 05/30/22 1051 05/30/22 1107 05/30/22 2301 05/31/22 0054  WBC 9.1  --   --  12.0*  NEUTROABS 7.8*  --   --   --   HGB 7.5* 8.2* 9.6* 11.4*  HCT 24.7* 24.0* 29.5* 35.5*  MCV 97.2  --   --  93.9  PLT 225  --   --  77*   Cardiac Enzymes: No results for input(s): "CKTOTAL", "CKMB", "CKMBINDEX", "TROPONINI" in the last 168 hours. CBG: Recent Labs  Lab 05/30/22 1622 05/30/22 1920 05/30/22 2312 05/31/22 0336 05/31/22 0748  GLUCAP 183* 197* 190* 167* 152*    Iron Studies: No results for input(s): "IRON", "TIBC", "TRANSFERRIN", "FERRITIN" in the last 72  hours. Studies/Results: DG Knee Left Port  Result Date: 05/30/2022 CLINICAL DATA:  Fall EXAM: PORTABLE LEFT KNEE - 1-2 VIEW COMPARISON:  None Available. FINDINGS: No acute fracture or dislocation of the left knee. Plate and screw fixation of the lateral tibial plateau with underlying, chronic, sclerotic fracture. Joint spaces are well preserved. No knee joint effusion. Vascular calcinosis. IMPRESSION: No acute fracture or dislocation of the left knee. Plate and screw fixation of the lateral tibial plateau with underlying, chronic, sclerotic fracture. Electronically Signed   By: Delanna Ahmadi M.D.   On: 05/30/2022 16:40   CT ABDOMEN PELVIS W CONTRAST  Result Date: 05/30/2022 CLINICAL DATA:  Nonlocalized abdominal pain. EXAM: CT ABDOMEN AND PELVIS WITH CONTRAST TECHNIQUE: Multidetector CT imaging of the abdomen and pelvis was performed using the standard protocol following bolus administration of intravenous contrast. RADIATION DOSE REDUCTION: This exam was performed according to the departmental dose-optimization program which includes automated exposure control, adjustment of the mA and/or kV according to patient size and/or use of  iterative reconstruction technique. CONTRAST:  125m OMNIPAQUE IOHEXOL 350 MG/ML SOLN COMPARISON:  Abdomen/pelvis CT 04/07/2019 FINDINGS: Lower chest: Please see report for chest CTA performed at the same time. Hepatobiliary: No suspicious focal abnormality within the liver parenchyma. Gallbladder is surgically absent. Pneumobilia suggests prior sphincterotomy. Pancreas: No focal mass lesion. No dilatation of the main duct. No intraparenchymal cyst. No peripancreatic edema. Spleen: No splenomegaly. No focal mass lesion. Adrenals/Urinary Tract: Stable 14 mm left adrenal nodule consistent with adenoma. No followup recommended. Right adrenal gland unremarkable. Innumerable small cystic lesions identified in the right kidney, the more [place] which are too small to characterize on  this exam. Innumerable cystic foci are seen in the left kidney with a large perinephric heterogeneous collection compatible with hematoma. Hematoma is probably subcapsular and measures 14.9 x 6.4 x 17.3 cm. Foci of increased attenuation in the interpolar left kidney between the parenchyma and the hematoma (axial 46/1 and coronal 87/6) raise the question of active extravasation. There is stranding around the left kidney compatible with edema/hemorrhage. Hemorrhage extends down the retroperitoneum into the left pelvic sidewall. Hematoma generates mass-effect on the left kidney. No hydroureteronephrosis. Bladder is decompressed. Stomach/Bowel: Tiny hiatal hernia. Stomach otherwise unremarkable. Duodenum is normally positioned as is the ligament of Treitz. No small bowel wall thickening. No small bowel dilatation. The terminal ileum is normal. The appendix is normal. No gross colonic mass. No colonic wall thickening. Vascular/Lymphatic: There is advanced atherosclerotic calcification of the abdominal aorta without aneurysm. There is no gastrohepatic or hepatoduodenal ligament lymphadenopathy. No retroperitoneal or mesenteric lymphadenopathy. No pelvic sidewall lymphadenopathy. Reproductive: The prostate gland and seminal vesicles are unremarkable. Other: No substantial intraperitoneal free fluid. Musculoskeletal: No worrisome lytic or sclerotic osseous abnormality. Bilateral pars interarticularis defects noted at L5. IMPRESSION: 1. Markedly large left perinephric/subcapsular hematoma involving the left kidney. There are adjacent foci of ill-defined increased attenuation at the margin of the kidney and the hematoma raising concern for active or ongoing bleeding. Hemorrhage extends in the retroperitoneal tissues inferiorly to the extraperitoneal left pelvic sidewall. Follow-up recommended to exclude hemorrhage secondary to underlying mass lesion. 2. Innumerable tiny cystic lesions in both kidneys, too small to  characterize and progressive in the interval since 2020. 3. Pneumobilia suggests prior sphincterotomy. 4. 14 mm left adrenal adenoma. Critical Value/emergent results were called by telephone at the time of interpretation on 05/30/2022 at 12:14 pm to [facility], who verbally acknowledged these results. Electronically Signed   By: EMisty StanleyM.D.   On: 05/30/2022 12:15   CT Angio Chest PE W and/or Wo Contrast  Result Date: 05/30/2022 CLINICAL DATA:  Nonlocalized abdominal pain. EXAM: CT ABDOMEN AND PELVIS WITH CONTRAST TECHNIQUE: Multidetector CT imaging of the abdomen and pelvis was performed using the standard protocol following bolus administration of intravenous contrast. RADIATION DOSE REDUCTION: This exam was performed according to the departmental dose-optimization program which includes automated exposure control, adjustment of the mA and/or kV according to patient size and/or use of iterative reconstruction technique. CONTRAST:  1071mOMNIPAQUE IOHEXOL 350 MG/ML SOLN COMPARISON:  Abdomen/pelvis CT 04/07/2019 FINDINGS: Lower chest: Please see report for chest CTA performed at the same time. Hepatobiliary: No suspicious focal abnormality within the liver parenchyma. Gallbladder is surgically absent. Pneumobilia suggests prior sphincterotomy. Pancreas: No focal mass lesion. No dilatation of the main duct. No intraparenchymal cyst. No peripancreatic edema. Spleen: No splenomegaly. No focal mass lesion. Adrenals/Urinary Tract: Stable 14 mm left adrenal nodule consistent with adenoma. No followup recommended. Right adrenal gland unremarkable.  Innumerable small cystic lesions identified in the right kidney, the more [place] of which are too small to characterize on this exam. Innumerable cystic foci are seen in the left kidney with a large perinephric heterogeneous collection compatible with hematoma. Hematoma is probably subcapsular and measures 14.9 x 6.4 x 17.3 cm. Foci of increased attenuation in  the interpolar left kidney between the parenchyma and the hematoma (axial 46/1 and coronal 87/6) raise the question of active extravasation. There is stranding around the left kidney compatible with edema/hemorrhage. Hemorrhage extends down the retroperitoneum into the left pelvic sidewall. Hematoma generates mass-effect on the left kidney. No hydroureteronephrosis. Bladder is decompressed. Stomach/Bowel: Tiny hiatal hernia. Stomach otherwise unremarkable. Duodenum is normally positioned as is the ligament of Treitz. No small bowel wall thickening. No small bowel dilatation. The terminal ileum is normal. The appendix is normal. No gross colonic mass. No colonic wall thickening. Vascular/Lymphatic: There is advanced atherosclerotic calcification of the abdominal aorta without aneurysm. There is no gastrohepatic or hepatoduodenal ligament lymphadenopathy. No retroperitoneal or mesenteric lymphadenopathy. No pelvic sidewall lymphadenopathy. Reproductive: The prostate gland and seminal vesicles are unremarkable. Other: No substantial intraperitoneal free fluid. Musculoskeletal: No worrisome lytic or sclerotic osseous abnormality. Bilateral pars interarticularis defects noted at L5. IMPRESSION: 1. Markedly large left perinephric/subcapsular hematoma involving the left kidney. There are adjacent foci of ill-defined increased attenuation at the margin of the kidney and the hematoma raising concern for active or ongoing bleeding. Hemorrhage extends in the retroperitoneal tissues inferiorly to the extraperitoneal left pelvic sidewall. Follow-up recommended to exclude hemorrhage secondary to underlying mass lesion. 2. Innumerable tiny cystic lesions in both kidneys, too small to characterize and progressive in the interval since 2020. 3. Pneumobilia suggests prior sphincterotomy. 4. 14 mm left adrenal adenoma. Critical Value/emergent results were called by telephone at the time of interpretation on 05/30/2022 at 12:14 pm to  provider [facility] , who verbally acknowledged these results. Electronically Signed   By: Misty Stanley M.D.   On: 05/30/2022 12:15   DG Chest 1 View  Result Date: 05/30/2022 CLINICAL DATA:  Questionable sepsis. EXAM: CHEST  1 VIEW COMPARISON:  01/02/2022 FINDINGS: Stable cardiomediastinal contours. Lung volumes are low. No pleural effusion or edema. No airspace opacities identified. Visualized osseous structures appear intact. IMPRESSION: Low lung volumes. Electronically Signed   By: Kerby Moors M.D.   On: 05/30/2022 11:53    Chlorhexidine Gluconate Cloth  6 each Topical Daily   Chlorhexidine Gluconate Cloth  6 each Topical Q0600   insulin aspart  0-6 Units Subcutaneous Q4H   sodium zirconium cyclosilicate  10 g Oral Daily    BMET    Component Value Date/Time   NA 135 05/31/2022 0235   K 5.5 (H) 05/31/2022 0235   CL 93 (L) 05/31/2022 0235   CO2 24 05/31/2022 0235   GLUCOSE 175 (H) 05/31/2022 0235   BUN 52 (H) 05/31/2022 0235   CREATININE 9.01 (H) 05/31/2022 0235   CALCIUM 8.7 (L) 05/31/2022 0235   GFRNONAA 5 (L) 05/31/2022 0235   GFRAA 7 (L) 10/26/2019 1626   CBC    Component Value Date/Time   WBC 12.0 (H) 05/31/2022 0054   RBC 3.78 (L) 05/31/2022 0054   HGB 11.4 (L) 05/31/2022 0054   HCT 35.5 (L) 05/31/2022 0054   PLT 77 (L) 05/31/2022 0054   MCV 93.9 05/31/2022 0054   MCH 30.2 05/31/2022 0054   MCHC 32.1 05/31/2022 0054   RDW 13.8 05/31/2022 0054   LYMPHSABS 0.5 (L) 05/30/2022 1051  MONOABS 0.8 05/30/2022 1051   EOSABS 0.0 05/30/2022 1051   BASOSABS 0.0 05/30/2022 1051    Dialysis Orders:  Unit: [facility]TS Time: 3:45  EDW: 90.2 kg Flows: 425/800  Bath: 2K/2Ca  Access: AVF 15 g Heparin: None ESA: Mircera 30 q 4 wks (to start 7/11) VDRA: Calcitriol 2.25 q HD    Assessment/Plan: Left kidney hematoma/ABLA - large perinephric/subscapular hematoma of left kidney after fall at home. IR consulted - s/p embolization 05/30/22. Per primary team  ESRD -  HD TTS. Does  not complete full treatments as outpatient. K+ 5.2 prior to transfusion. Hypotensive and going to ICU so will need to done at bedside. Plan for HD in am as staffing allows.  Hypertension/volume  - Hypotensive on admission in the setting of blood loss. Repletion with prbcs. HD if BP permits.  Acute blood loss anemia   - Hgb 7.5 on admission. 3 units prbcs ordered and Hgb up to 11.5 today. Follow trends  Metabolic bone disease -  Calcium acceptable. Continue home meds  Nutrition - Renal diet/fluid restriction when eating DM -per primary PAD - LLE ulcer - follows with Dr. Donzetta Matters CAD Thrombocytopenia - likely due to recent ABLA.  Donetta Potts, MD [facility]

## 2022-05-31 NOTE — Progress Notes (Signed)
Pt b/p down to 87/60, gave Albumin 25% solution via dialysis access. B/p rebounded to 108/68. Will continue to monitor and tx as indicated.

## 2022-05-31 NOTE — Progress Notes (Signed)
NAME:  Christian Dalton, MRN:  [id number], DOB:  03/11/1941, LOS: 1 ADMISSION DATE:  05/30/2022, CONSULTATION DATE:  7/7  REFERRING MD: Lavonna Rua, PA-C , CHIEF COMPLAINT:  Mechanical Fall, Abd Pain    History of Present Illness:  81 y/o M who presented to [facility] ER on 7/7 with reports of mechanical fall.    The patients wife reports he is followed by Dr. Donzetta Matters for PAD and is pending work up for possible amputation on LLE (great toe gangrene).  He was pending a TEE and LHC on 7/7 at [facility] but was canceled as his wife noted he did not feel well after HD on 7/6.  She reports he went to HD as usual on 7/6 and noted he felt bad after.  He tried to get out of the car after HD and fell on his left side.  She notes he didn't tell her until day of admit that he was feeling dizzy. She reports his blood pressure normally runs 1-teens to 182'X systolic and is not on antihypertensives.  When the patient fell, he struck his left side, left knee, and arm.  He reported back and flank pain overnight into 7/7 prompting evaluation in the ER.  Initial work up notable for CTA Chest, ABD, pelvis which showed a markedly large left peri-nephric / subcapsular hematoma involving the left kidney, there are adjacent foci of ill-defined increased attenuation at the margin of the kidney and the hematoma raising concern for active or ongoing bleeding.  Hemorrhage extends into the retroperitoneal tissues inferiorly to the extraperitoneal left pelvic sidewall. Trauma, IR and Urology were consulted with recommendations of angio-embolization of the left kidney.  Note Hgb 13.9 in April, on admit 7.5.  He was given 1L LR in ER, 3 units PRBC ordered for resuscitation.   PCCM consulted for admission.   Pertinent  Medical History  CAD  AF  ESRD on HD (T/Th/S) COPD  OSA  Hypothyroidism  Depression  Anemia DM Wears Hearing Aid + Glasses PAD - followed by Dr. Donzetta Matters, known BLE wounds, pending eval for possible amputation   Significant  Hospital Events: Including procedures, antibiotic start and stop dates in addition to other pertinent events   7/7 Admit after fall on 7/6 with abd pain. Found to have large perinephric hematoma, Hgb down to 7.5  Interim History / Subjective:  Pt reports back and abdominal pain  Objective   Blood pressure 109/66, pulse (!) 104, temperature 97.6 F (36.4 C), temperature source Oral, resp. rate (!) 24, height '5\' 10"'$  (1.778 m), weight 94.2 kg, SpO2 96 %.        Intake/Output Summary (Last 24 hours) at 05/31/2022 0815 Last data filed at 05/31/2022 0400 Gross per 24 hour  Intake 1123.25 ml  Output --  Net 1123.25 ml   Filed Weights   05/30/22 1613 05/31/22 0354 05/31/22 0748  Weight: 94.5 kg 93.4 kg 94.2 kg    Examination: General: Elderly male,  HENT: MMM, NCAT  Lungs: CTAB Cardiovascular: RRR, s1 s2  Abdomen: soft, nt nd  Extremities: no edema, av fistula  Neuro: alert following commands     Resolved Hospital Problem list     Mechanical fall   Assessment & Plan:   Left Peri-Nephric / Subcapsular Hematoma  S/p IR embolization of left kidney  Hypotension in setting of ABLA  P: Follow H&H, stable   Acute Blood Loss Anemia / Normocytic Anemia  In setting of perinephric hematoma, extravasation. Not on anticoagulation. ASA only pre-admit.  P: Follow H&H, transfuse if Hgb <7   ESRD on HD  T/Th/S HD  P: Appreciate nephrology  iHD at bedside this AM in ICU   DM with Hyperglycemia  -SSI, sensitive scale    CAD, PVD  -supportive care  -will need follow up with Dr. Donzetta Matters post discharge and with Cardiology (follows in [place])  Moderate Protein Calorie Malnutrition  -diet when able  -ensure when able to take PO's  Hypothyroidism  -continue synthroid when able to take PO's   Depression  -hold wellbutrin, lexapro   Best Practice (right click and "Reselect all SmartList Selections" daily)  Diet/type: NPO DVT prophylaxis: SCD GI prophylaxis: N/A Lines: N/A Foley:   N/A Code Status:  full code Last date of multidisciplinary goals of care discussion: full code.    Labs   CBC: Recent Labs  Lab 05/30/22 1051 05/30/22 1107 05/30/22 2301 05/31/22 0054  WBC 9.1  --   --  12.0*  NEUTROABS 7.8*  --   --   --   HGB 7.5* 8.2* 9.6* 11.4*  HCT 24.7* 24.0* 29.5* 35.5*  MCV 97.2  --   --  93.9  PLT 225  --   --  77*    Basic Metabolic Panel: Recent Labs  Lab 05/30/22 1051 05/30/22 1107 05/31/22 0235  NA 136 133* 135  K 5.3* 5.2* 5.5*  CL 94* 94* 93*  CO2 20*  --  24  GLUCOSE 265* 255* 175*  BUN 46* 41* 52*  CREATININE 8.61* 9.00* 9.01*  CALCIUM 8.7*  --  8.7*  PHOS  --   --  7.4*   GFR: Estimated Creatinine Clearance: 7.7 mL/min (A) (by C-G formula based on SCr of 9.01 mg/dL (H)). Recent Labs  Lab 05/30/22 1051 05/31/22 0054 05/31/22 0235  WBC 9.1 12.0*  --   LATICACIDVEN 5.5*  --  2.2*    Liver Function Tests: Recent Labs  Lab 05/30/22 1051 05/31/22 0235  AST 12*  --   ALT 9  --   ALKPHOS 70  --   BILITOT 0.6  --   PROT 4.9*  --   ALBUMIN 2.7* 2.5*   Recent Labs  Lab 05/30/22 1051  LIPASE 18   No results for input(s): "AMMONIA" in the last 168 hours.  ABG    Component Value Date/Time   TCO2 22 05/30/2022 1107     Coagulation Profile: Recent Labs  Lab 05/30/22 1051  INR 1.4*    Cardiac Enzymes: No results for input(s): "CKTOTAL", "CKMB", "CKMBINDEX", "TROPONINI" in the last 168 hours.  HbA1C: Hgb A1c MFr Bld  Date/Time Value Ref Range Status  05/31/2022 02:35 AM 6.4 (H) 4.8 - 5.6 % Final    Comment:    (NOTE) Pre diabetes:          5.7%-6.4%  Diabetes:              >6.4%  Glycemic control for   <7.0% adults with diabetes   04/08/2019 02:42 AM 6.4 (H) 4.8 - 5.6 % Final    Comment:    (NOTE) Pre diabetes:          5.7%-6.4% Diabetes:              >6.4% Glycemic control for   <7.0% adults with diabetes     CBG: Recent Labs  Lab 05/30/22 1622 05/30/22 1920 05/30/22 2312 05/31/22 0336  05/31/22 0748  GLUCAP 183* 197* 190* 167* 152*    Past Medical History:  He,  has  a past medical history of Acute cholecystitis (08/17/2018), Anemia, Arthritis, Atrial fibrillation ([place]), CAD (coronary artery disease), Cholecystitis, COPD (chronic obstructive pulmonary disease) ([place]), Depression, Diabetes mellitus without complication ([place]), ESRD (end stage renal disease) on dialysis ([place]), HOH (hard of hearing), Hypertension, Hypothyroidism, OSA (obstructive sleep apnea), Spinal stenosis, Wears dentures, Wears glasses, and Wears hearing aid in both ears.   Surgical History:   Past Surgical History:  Procedure Laterality Date   ABDOMINAL AORTOGRAM N/A 01/15/2021   Procedure: ABDOMINAL AORTOGRAM;  Surgeon: Serafina Mitchell, MD;  Location: [place] [facility];  Service: Cardiovascular;  Laterality: N/A;   ABDOMINAL AORTOGRAM W/LOWER EXTREMITY Bilateral 12/11/2020   Procedure: ABDOMINAL AORTOGRAM W/LOWER EXTREMITY;  Surgeon: Serafina Mitchell, MD;  Location: [place] [facility];  Service: Cardiovascular;  Laterality: Bilateral;   ABDOMINAL AORTOGRAM W/LOWER EXTREMITY N/A 03/17/2022   Procedure: ABDOMINAL AORTOGRAM W/LOWER EXTREMITY;  Surgeon: Waynetta Sandy, MD;  Location: [place] [facility];  Service: Cardiovascular;  Laterality: N/A;   BASCILIC VEIN TRANSPOSITION Left 12/02/2019   Procedure: BASCILIC VEIN TRANSPOSITION LEFT FIRST STAGE;  Surgeon: Serafina Mitchell, MD;  Location: [place];  Service: Vascular;  Laterality: Left;   [place] Left 02/10/2020   Procedure: BASCILIC VEIN TRANSPOSITION LEFT SECOND STAGE;  Surgeon: Serafina Mitchell, MD;  Location: [place];  Service: Vascular;  Laterality: Left;   BILIARY STENT PLACEMENT  11/25/2018   Procedure: [place];  Surgeon: [name], MD;  Location: [facility] ENDOSCOPY;  Service: Gastroenterology;;   BIOPSY  12/20/2018   Procedure: BIOPSY;  Surgeon: [name], MD;  Location: [facility] ENDOSCOPY;  Service: Gastroenterology;;    BIOPSY  08/03/2020   Procedure: BIOPSY;  Surgeon: Otis Brace, MD;  Location: [facility] ENDOSCOPY;  Service: Gastroenterology;;  EGD and COLON   CARDIAC CATHETERIZATION     CHOLECYSTECTOMY N/A 11/14/2018   Procedure: LAPAROSCOPIC CHOLECYSTECTOMY WITH INTRAOPERATIVE CHOLANGIOGRAM;  Surgeon: Coralie Keens, MD;  Location: [place];  Service: General;  Laterality: N/A;   COLONOSCOPY W/ BIOPSIES AND POLYPECTOMY     COLONOSCOPY WITH PROPOFOL N/A 08/03/2020   Procedure: COLONOSCOPY WITH PROPOFOL;  Surgeon: Otis Brace, MD;  Location: [facility] ENDOSCOPY;  Service: Gastroenterology;  Laterality: N/A;   ERCP N/A 11/25/2018   Procedure: ENDOSCOPIC RETROGRADE CHOLANGIOPANCREATOGRAPHY (ERCP);  Surgeon: [name], MD;  Location: [place];  Service: Gastroenterology;  Laterality: N/A;   ESOPHAGOGASTRODUODENOSCOPY N/A 12/20/2018   Procedure: ESOPHAGOGASTRODUODENOSCOPY (EGD);  Surgeon: [name], MD;  Location: [facility];  Service: Gastroenterology;  Laterality: N/A;   ESOPHAGOGASTRODUODENOSCOPY (EGD) WITH PROPOFOL N/A 08/03/2020   Procedure: ESOPHAGOGASTRODUODENOSCOPY (EGD) WITH PROPOFOL;  Surgeon: Otis Brace, MD;  Location: [facility] ENDOSCOPY;  Service: Gastroenterology;  Laterality: N/A;   EYE SURGERY     cataract B/L   HAND SURGERY Right    IR DIALY SHUNT INTRO NEEDLE/INTRACATH INITIAL W/IMG LEFT Left 08/20/2018   IR PERC CHOLECYSTOSTOMY  08/19/2018   IR RADIOLOGIST EVAL & MGMT  09/29/2018   LOWER EXTREMITY ANGIOGRAPHY Left 01/15/2021   Procedure: Lower Extremity Angiography;  Surgeon: Serafina Mitchell, MD;  Location: [place] [facility];  Service: Cardiovascular;  Laterality: Left;   MULTIPLE TOOTH EXTRACTIONS     PERIPHERAL VASCULAR BALLOON ANGIOPLASTY Right 12/11/2020   Procedure: PERIPHERAL VASCULAR BALLOON ANGIOPLASTY;  Surgeon: Serafina Mitchell, MD;  Location: [place] [facility];  Service: Cardiovascular;  Laterality: Right;   PERIPHERAL VASCULAR BALLOON ANGIOPLASTY Left 01/15/2021   Procedure:  PERIPHERAL VASCULAR BALLOON ANGIOPLASTY;  Surgeon: Serafina Mitchell, MD;  Location: [place] [facility]  [facility];  Service: Cardiovascular;  Laterality: Left;  PT   POLYPECTOMY  08/03/2020   Procedure: POLYPECTOMY;  Surgeon: Otis Brace, MD;  Location: [facility] ENDOSCOPY;  Service: Gastroenterology;;   RESECTION OF ARTERIOVENOUS FISTULA ANEURYSM Left 10/27/2019   Procedure: Resection Of Left Upper Arm  Arteriovenous Fistula Aneurysm Times Two;  Surgeon: Serafina Mitchell, MD;  Location: [place];  Service: Vascular;  Laterality: Left;   SHOULDER SURGERY Right    SPHINCTEROTOMY  11/25/2018   Procedure: SPHINCTEROTOMY;  Surgeon: [name], MD;  Location: [facility] ENDOSCOPY;  Service: Gastroenterology;;   Lavell Islam REMOVAL  12/20/2018   Procedure: STENT REMOVAL;  Surgeon: [name], MD;  Location: [facility] ENDOSCOPY;  Service: Gastroenterology;;   TIBIA FRACTURE SURGERY       Social History:   reports that he has never smoked. He has quit using smokeless tobacco.  His smokeless tobacco use included chew. He reports that he does not currently use alcohol. He reports that he does not use drugs.   Family History:  His family history includes Heart disease in his father, mother, and sister.   Allergies Allergies  Allergen Reactions   Avelox [Moxifloxacin Hcl] Other (See Comments)    Hallucinations    Gabapentin Other (See Comments)    Caused staggering and red feet   Metoprolol Other (See Comments)    Dropped pulse too low; was taken off of this by MD   Quinolones Other (See Comments)    unknown   Shellfish Allergy Nausea And Vomiting and Other (See Comments)    Made patient VERY nauseous and he developed stomach cramps   Sulfa Antibiotics Other (See Comments)    Allergy is from childhood (reaction not recalled)   Tetracyclines & Related Other (See Comments)    Blisters on hands and arms   Adhesive [Tape] Itching and Rash    Paper tape only- skin cannot tolerate "heavy" tapes     Home Medications  Prior to  Admission medications   Medication Sig Start Date End Date Taking? Authorizing Provider  aspirin EC 81 MG tablet Take 81 mg by mouth at bedtime.     [provider]  atorvastatin (LIPITOR) 10 MG tablet Take 10 mg by mouth at bedtime.    [provider]  buPROPion (WELLBUTRIN XL) 150 MG 24 hr tablet Take 150 mg by mouth every evening.    [provider]  docusate sodium (COLACE) 100 MG capsule Take 100 mg by mouth 2 (two) times daily.     [provider]  escitalopram (LEXAPRO) 10 MG tablet Take 10 mg by mouth at bedtime.  08/30/15   [provider]  fluticasone (FLONASE) 50 MCG/ACT nasal spray Place 1 spray into both nostrils daily as needed for allergies or rhinitis.    [provider]  lactulose, encephalopathy, (CHRONULAC) 10 GM/15ML SOLN Take 10 g by mouth every evening. 08/02/18   [provider]  levothyroxine (SYNTHROID, LEVOTHROID) 125 MCG tablet Take 125 mcg by mouth daily before breakfast.    [provider]  lidocaine-prilocaine (EMLA) cream Apply 1 application topically Every Tuesday,Thursday,and Saturday with dialysis. 08/02/18   [provider]  linaclotide (LINZESS) 290 MCG CAPS capsule Take 290 mcg by mouth every evening.     [provider]  loratadine (CLARITIN) 10 MG tablet Take 10 mg by mouth daily.     [provider]  montelukast (SINGULAIR) 10 MG tablet Take 10 mg every evening by mouth.    [provider]  nitroGLYCERIN (NITROSTAT)  0.4 MG SL tablet Place 0.4 mg under the tongue every 5 (five) minutes as needed for chest pain.    [provider]  oxyCODONE-acetaminophen (PERCOCET) 10-325 MG tablet Take 1 tablet by mouth every 4 (four) hours as needed for pain. 12/09/18   [provider]  PROAIR HFA 108 (90 Base) MCG/ACT inhaler Inhale 2 puffs into the lungs every 6 (six) hours as needed for shortness of breath. 05/27/20   [provider]  Propylene  Glycol (SYSTANE BALANCE) 0.6 % SOLN Place 1 drop into both eyes as needed (dry eyes).    [provider]  sevelamer carbonate (RENVELA) 800 MG tablet Take 3 tablets (2,400 mg total) by mouth 3 (three) times daily with meals. Patient taking differently: Take 2,400 mg by mouth 2 (two) times daily with a meal. 08/24/18   Elgergawy, Silver Huguenin, MD     This patient is critically ill with multiple organ system failure; which, requires frequent high complexity decision making, assessment, support, evaluation, and titration of therapies. This was completed through the application of advanced monitoring technologies and extensive interpretation of multiple databases. During this encounter critical care time was devoted to patient care services described in this note for 36 minutes.  Garner Nash, DO [facility] 05/31/2022 8:15 AM

## 2022-05-31 NOTE — Progress Notes (Signed)
Pt has prevalon boots ordered. I am awaiting there arrival to this unit

## 2022-05-31 NOTE — Progress Notes (Signed)
Patient ID: Christian Dalton, male   DOB: 1941/01/10, 81 y.o.   MRN: [id number] [facility] Surgery Progress Note     Subjective: CC-  About to start dialysis. Denies any abdominal pain, n/v.  Hgb 11.4 from 9.6  Objective: Vital signs in last 24 hours: Temp:  [97.6 F (36.4 C)-99.3 F (37.4 C)] 97.6 F (36.4 C) (07/08 0800) Pulse Rate:  [91-116] 104 (07/08 0400) Resp:  [12-26] 24 (07/08 0400) BP: (61-132)/(47-101) 109/66 (07/08 0800) SpO2:  [91 %-100 %] 96 % (07/08 0400) Weight:  [92 kg-94.5 kg] 94.2 kg (07/08 0748) Last BM Date :  (PTA)  Intake/Output from previous day: 07/07 0701 - 07/08 0700 In: 1123.3 [P.O.:90; I.V.:27; Blood:1006.3] Out: -  Intake/Output this shift: No intake/output data recorded.  PE: Gen:  Alert, NAD Card: mild tachy low 100s Pulm:  rate and effort normal Abd: protuberant, soft, very mild left flank tenderness, +BS Skin: no rashes noted, warm and dry  Lab Results:  Recent Labs    05/30/22 1051 05/30/22 1107 05/30/22 2301 05/31/22 0054  WBC 9.1  --   --  12.0*  HGB 7.5*   < > 9.6* 11.4*  HCT 24.7*   < > 29.5* 35.5*  PLT 225  --   --  77*   < > = values in this interval not displayed.   BMET Recent Labs    05/30/22 1051 05/30/22 1107 05/31/22 0235  NA 136 133* 135  K 5.3* 5.2* 5.5*  CL 94* 94* 93*  CO2 20*  --  24  GLUCOSE 265* 255* 175*  BUN 46* 41* 52*  CREATININE 8.61* 9.00* 9.01*  CALCIUM 8.7*  --  8.7*   PT/INR Recent Labs    05/30/22 1051  LABPROT 17.1*  INR 1.4*   CMP     Component Value Date/Time   NA 135 05/31/2022 0235   K 5.5 (H) 05/31/2022 0235   CL 93 (L) 05/31/2022 0235   CO2 24 05/31/2022 0235   GLUCOSE 175 (H) 05/31/2022 0235   BUN 52 (H) 05/31/2022 0235   CREATININE 9.01 (H) 05/31/2022 0235   CALCIUM 8.7 (L) 05/31/2022 0235   PROT 4.9 (L) 05/30/2022 1051   ALBUMIN 2.5 (L) 05/31/2022 0235   AST 12 (L) 05/30/2022 1051   ALT 9 05/30/2022 1051   ALKPHOS 70 05/30/2022 1051   BILITOT 0.6 05/30/2022  1051   GFRNONAA 5 (L) 05/31/2022 0235   GFRAA 7 (L) 10/26/2019 1626   Lipase     Component Value Date/Time   LIPASE 18 05/30/2022 1051       Studies/Results: DG Knee Left Port  Result Date: 05/30/2022 CLINICAL DATA:  Fall EXAM: PORTABLE LEFT KNEE - 1-2 VIEW COMPARISON:  None Available. FINDINGS: No acute fracture or dislocation of the left knee. Plate and screw fixation of the lateral tibial plateau with underlying, chronic, sclerotic fracture. Joint spaces are well preserved. No knee joint effusion. Vascular calcinosis. IMPRESSION: No acute fracture or dislocation of the left knee. Plate and screw fixation of the lateral tibial plateau with underlying, chronic, sclerotic fracture. Electronically Signed   By: Delanna Ahmadi M.D.   On: 05/30/2022 16:40   CT ABDOMEN PELVIS W CONTRAST  Result Date: 05/30/2022 CLINICAL DATA:  Nonlocalized abdominal pain. EXAM: CT ABDOMEN AND PELVIS WITH CONTRAST TECHNIQUE: Multidetector CT imaging of the abdomen and pelvis was performed using the standard protocol following bolus administration of intravenous contrast. RADIATION DOSE REDUCTION: This exam was performed according to the departmental dose-optimization program which  includes automated exposure control, adjustment of the mA and/or kV according to patient size and/or use of iterative reconstruction technique. CONTRAST:  138m OMNIPAQUE IOHEXOL 350 MG/ML SOLN COMPARISON:  Abdomen/pelvis CT 04/07/2019 FINDINGS: Lower chest: Please see report for chest CTA performed at the same time. Hepatobiliary: No suspicious focal abnormality within the liver parenchyma. Gallbladder is surgically absent. Pneumobilia suggests prior sphincterotomy. Pancreas: No focal mass lesion. No dilatation of the main duct. No intraparenchymal cyst. No peripancreatic edema. Spleen: No splenomegaly. No focal mass lesion. Adrenals/Urinary Tract: Stable 14 mm left adrenal nodule consistent with adenoma. No followup recommended. Right adrenal  gland unremarkable. Innumerable small cystic lesions identified in the right kidney, the more [place] which are too small to characterize on this exam. Innumerable cystic foci are seen in the left kidney with a large perinephric heterogeneous collection compatible with hematoma. Hematoma is probably subcapsular and measures 14.9 x 6.4 x 17.3 cm. Foci of increased attenuation in the interpolar left kidney between the parenchyma and the hematoma (axial 46/1 and coronal 87/6) raise the question of active extravasation. There is stranding around the left kidney compatible with edema/hemorrhage. Hemorrhage extends down the retroperitoneum into the left pelvic sidewall. Hematoma generates mass-effect on the left kidney. No hydroureteronephrosis. Bladder is decompressed. Stomach/Bowel: Tiny hiatal hernia. Stomach otherwise unremarkable. Duodenum is normally positioned as is the ligament of Treitz. No small bowel wall thickening. No small bowel dilatation. The terminal ileum is normal. The appendix is normal. No gross colonic mass. No colonic wall thickening. Vascular/Lymphatic: There is advanced atherosclerotic calcification of the abdominal aorta without aneurysm. There is no gastrohepatic or hepatoduodenal ligament lymphadenopathy. No retroperitoneal or mesenteric lymphadenopathy. No pelvic sidewall lymphadenopathy. Reproductive: The prostate gland and seminal vesicles are unremarkable. Other: No substantial intraperitoneal free fluid. Musculoskeletal: No worrisome lytic or sclerotic osseous abnormality. Bilateral pars interarticularis defects noted at L5. IMPRESSION: 1. Markedly large left perinephric/subcapsular hematoma involving the left kidney. There are adjacent foci of ill-defined increased attenuation at the margin of the kidney and the hematoma raising concern for active or ongoing bleeding. Hemorrhage extends in the retroperitoneal tissues inferiorly to the extraperitoneal left pelvic sidewall. Follow-up  recommended to exclude hemorrhage secondary to underlying mass lesion. 2. Innumerable tiny cystic lesions in both kidneys, too small to characterize and progressive in the interval since 2020. 3. Pneumobilia suggests prior sphincterotomy. 4. 14 mm left adrenal adenoma. Critical Value/emergent results were called by telephone at the time of interpretation on 05/30/2022 at 12:14 pm to [facility], who verbally acknowledged these results. Electronically Signed   By: EMisty StanleyM.D.   On: 05/30/2022 12:15   CT Angio Chest PE W and/or Wo Contrast  Result Date: 05/30/2022 CLINICAL DATA:  Nonlocalized abdominal pain. EXAM: CT ABDOMEN AND PELVIS WITH CONTRAST TECHNIQUE: Multidetector CT imaging of the abdomen and pelvis was performed using the standard protocol following bolus administration of intravenous contrast. RADIATION DOSE REDUCTION: This exam was performed according to the departmental dose-optimization program which includes automated exposure control, adjustment of the mA and/or kV according to patient size and/or use of iterative reconstruction technique. CONTRAST:  1034mOMNIPAQUE IOHEXOL 350 MG/ML SOLN COMPARISON:  Abdomen/pelvis CT 04/07/2019 FINDINGS: Lower chest: Please see report for chest CTA performed at the same time. Hepatobiliary: No suspicious focal abnormality within the liver parenchyma. Gallbladder is surgically absent. Pneumobilia suggests prior sphincterotomy. Pancreas: No focal mass lesion. No dilatation of the main duct. No intraparenchymal cyst. No peripancreatic edema. Spleen: No splenomegaly. No focal mass lesion. Adrenals/Urinary  Tract: Stable 14 mm left adrenal nodule consistent with adenoma. No followup recommended. Right adrenal gland unremarkable. Innumerable small cystic lesions identified in the right kidney, the more [place] of which are too small to characterize on this exam. Innumerable cystic foci are seen in the left kidney with a large perinephric heterogeneous  collection compatible with hematoma. Hematoma is probably subcapsular and measures 14.9 x 6.4 x 17.3 cm. Foci of increased attenuation in the interpolar left kidney between the parenchyma and the hematoma (axial 46/1 and coronal 87/6) raise the question of active extravasation. There is stranding around the left kidney compatible with edema/hemorrhage. Hemorrhage extends down the retroperitoneum into the left pelvic sidewall. Hematoma generates mass-effect on the left kidney. No hydroureteronephrosis. Bladder is decompressed. Stomach/Bowel: Tiny hiatal hernia. Stomach otherwise unremarkable. Duodenum is normally positioned as is the ligament of Treitz. No small bowel wall thickening. No small bowel dilatation. The terminal ileum is normal. The appendix is normal. No gross colonic mass. No colonic wall thickening. Vascular/Lymphatic: There is advanced atherosclerotic calcification of the abdominal aorta without aneurysm. There is no gastrohepatic or hepatoduodenal ligament lymphadenopathy. No retroperitoneal or mesenteric lymphadenopathy. No pelvic sidewall lymphadenopathy. Reproductive: The prostate gland and seminal vesicles are unremarkable. Other: No substantial intraperitoneal free fluid. Musculoskeletal: No worrisome lytic or sclerotic osseous abnormality. Bilateral pars interarticularis defects noted at L5. IMPRESSION: 1. Markedly large left perinephric/subcapsular hematoma involving the left kidney. There are adjacent foci of ill-defined increased attenuation at the margin of the kidney and the hematoma raising concern for active or ongoing bleeding. Hemorrhage extends in the retroperitoneal tissues inferiorly to the extraperitoneal left pelvic sidewall. Follow-up recommended to exclude hemorrhage secondary to underlying mass lesion. 2. Innumerable tiny cystic lesions in both kidneys, too small to characterize and progressive in the interval since 2020. 3. Pneumobilia suggests prior sphincterotomy. 4. 14 mm  left adrenal adenoma. Critical Value/emergent results were called by telephone at the time of interpretation on 05/30/2022 at 12:14 pm to provider [facility] , who verbally acknowledged these results. Electronically Signed   By: Misty Stanley M.D.   On: 05/30/2022 12:15   DG Chest 1 View  Result Date: 05/30/2022 CLINICAL DATA:  Questionable sepsis. EXAM: CHEST  1 VIEW COMPARISON:  01/02/2022 FINDINGS: Stable cardiomediastinal contours. Lung volumes are low. No pleural effusion or edema. No airspace opacities identified. Visualized osseous structures appear intact. IMPRESSION: Low lung volumes. Electronically Signed   By: Kerby Moors M.D.   On: 05/30/2022 11:53    Anti-infectives: Anti-infectives (From admission, onward)    None        Assessment/Plan Fall  Left perinephric/subcapsular hematoma with possible active extravasation and hemorrhagic shock - s/p IR left renal arteriogram and subselective embolization of two subsegmental branches of the left renal artery supplying two discrete areas of active extravasation 7/7 - s/p 3 units PRBCs 7/7. Hgb 11.4 today from 9.6, stable - ok for diet from our standpoint Abrasions - local wound care   FEN - NPO, IVF VTE - SCDs  ID - no current abx   ESRD on HD T/TR/Sat HTN COPD OSA CAD A. Fib Hypothyroidism T2DM Depression  PAD  I reviewed Consultant IR and nephrology notes, CCM  notes, last 24 h vitals and pain scores, last 48 h intake and output, last 24 h labs and trends, and last 24 h imaging results.    LOS: 1 day    Wellington Hampshire, [facility] Surgery 05/31/2022, 9:13 AM Please see Amion for pager number during day  hours 7:00am-4:30pm

## 2022-05-31 NOTE — Progress Notes (Signed)
Arrived in room,patient in bed, alert and oriented. Informed consent signed and in chart.  Time tx completed:1230  HD treatment completed at bedside. Patient tolerated well. Fistula catheter without signs and symptoms of complications. Patient t alert and orient and in no acute distress. Report given to bedside RN.  Total UF removed:250  Medication given:A;bumin 25%  Post HD VS:97.7,103,17,123/65  Post HD weight: 94.2kg

## 2022-06-01 DIAGNOSIS — D62 Acute posthemorrhagic anemia: Secondary | ICD-10-CM | POA: Diagnosis not present

## 2022-06-01 LAB — HEMOGLOBIN AND HEMATOCRIT, BLOOD
HCT: 24.2 % — ABNORMAL LOW (ref 39.0–52.0)
HCT: 24.5 % — ABNORMAL LOW (ref 39.0–52.0)
HCT: 24.8 % — ABNORMAL LOW (ref 39.0–52.0)
Hemoglobin: 8 g/dL — ABNORMAL LOW (ref 13.0–17.0)
Hemoglobin: 7.9 g/dL — ABNORMAL LOW (ref 13.0–17.0)
Hemoglobin: 8.2 g/dL — ABNORMAL LOW (ref 13.0–17.0)

## 2022-06-01 LAB — CBC
HCT: 23.9 % — ABNORMAL LOW (ref 39.0–52.0)
Hemoglobin: 8 g/dL — ABNORMAL LOW (ref 13.0–17.0)
MCH: 30.2 pg (ref 26.0–34.0)
MCHC: 33.5 g/dL (ref 30.0–36.0)
MCV: 90.2 fL (ref 80.0–100.0)
Platelets: 188 10*3/uL (ref 150–400)
RBC: 2.65 MIL/uL — ABNORMAL LOW (ref 4.22–5.81)
RDW: 15.6 % — ABNORMAL HIGH (ref 11.5–15.5)
WBC: 10.3 10*3/uL (ref 4.0–10.5)
nRBC: 0 % (ref 0.0–0.2)

## 2022-06-01 LAB — GLUCOSE, CAPILLARY
Glucose-Capillary: 156 mg/dL — ABNORMAL HIGH (ref 70–99)
Glucose-Capillary: 137 mg/dL — ABNORMAL HIGH (ref 70–99)
Glucose-Capillary: 160 mg/dL — ABNORMAL HIGH (ref 70–99)
Glucose-Capillary: 138 mg/dL — ABNORMAL HIGH (ref 70–99)
Glucose-Capillary: 194 mg/dL — ABNORMAL HIGH (ref 70–99)

## 2022-06-01 LAB — HEPATITIS B SURFACE ANTIBODY, QUANTITATIVE: Hep B S AB Quant (Post): 23.2 m[IU]/mL (ref >9.9)

## 2022-06-01 LAB — BASIC METABOLIC PANEL
Anion gap: 16 — ABNORMAL HIGH (ref 5–15)
BUN: 40 mg/dL — ABNORMAL HIGH (ref 8–23)
CO2: 25 mmol/L (ref 22–32)
Calcium: 8.8 mg/dL — ABNORMAL LOW (ref 8.9–10.3)
Chloride: 92 mmol/L — ABNORMAL LOW (ref 98–111)
Creatinine, Ser: 6.74 mg/dL — ABNORMAL HIGH (ref 0.61–1.24)
GFR, Estimated: 8 mL/min — ABNORMAL LOW (ref >60)
Glucose, Bld: 153 mg/dL — ABNORMAL HIGH (ref 70–99)
Potassium: 4.5 mmol/L (ref 3.5–5.1)
Sodium: 133 mmol/L — ABNORMAL LOW (ref 135–145)

## 2022-06-01 LAB — PREPARE RBC (CROSSMATCH)

## 2022-06-01 MED ORDER — LACTULOSE ENCEPHALOPATHY 10 GM/15ML PO SOLN
10.0000 g | Freq: Every evening | ORAL | Status: DC
  Filled 2022-06-01: qty 15

## 2022-06-01 MED ORDER — LEVOTHYROXINE SODIUM 100 MCG PO TABS
125.0000 ug | ORAL_TABLET | Freq: Every day | ORAL | Status: DC
  Administered 2022-06-02 – 2022-06-05 (×4): 125 ug via ORAL
  Filled 2022-06-01 (×4): qty 1

## 2022-06-01 MED ORDER — BUPROPION HCL ER (XL) 150 MG PO TB24
150.0000 mg | ORAL_TABLET | Freq: Every evening | ORAL | Status: DC
  Administered 2022-06-01 – 2022-06-04 (×4): 150 mg via ORAL
  Filled 2022-06-01 (×4): qty 1

## 2022-06-01 MED ORDER — MONTELUKAST SODIUM 10 MG PO TABS
10.0000 mg | ORAL_TABLET | Freq: Every evening | ORAL | Status: DC
  Administered 2022-06-01 – 2022-06-04 (×4): 10 mg via ORAL
  Filled 2022-06-01 (×4): qty 1

## 2022-06-01 MED ORDER — ESCITALOPRAM OXALATE 10 MG PO TABS
10.0000 mg | ORAL_TABLET | Freq: Every day | ORAL | Status: DC
  Administered 2022-06-01 – 2022-06-04 (×4): 10 mg via ORAL
  Filled 2022-06-01 (×4): qty 1

## 2022-06-01 MED ORDER — LACTULOSE 10 GM/15ML PO SOLN
10.0000 g | Freq: Every day | ORAL | Status: DC
  Administered 2022-06-01 – 2022-06-04 (×4): 10 g via ORAL
  Filled 2022-06-01 (×4): qty 15

## 2022-06-01 MED ORDER — ALBUTEROL SULFATE (2.5 MG/3ML) 0.083% IN NEBU
3.0000 mL | INHALATION_SOLUTION | Freq: Four times a day (QID) | RESPIRATORY_TRACT | Status: DC | PRN
Start: 2022-06-01 — End: 2022-06-05

## 2022-06-01 MED ORDER — SODIUM CHLORIDE 0.9% IV SOLUTION: Freq: Once | INTRAVENOUS | Status: DC

## 2022-06-01 MED ORDER — ATORVASTATIN CALCIUM 10 MG PO TABS
10.0000 mg | ORAL_TABLET | Freq: Every day | ORAL | Status: DC
  Administered 2022-06-01 – 2022-06-04 (×4): 10 mg via ORAL
  Filled 2022-06-01 (×4): qty 1

## 2022-06-01 NOTE — Progress Notes (Signed)
eLink Physician-Brief Progress Note Patient Name: Christian Dalton DOB: 1940/12/22 MRN: [id number]   Date of Service  06/01/2022  HPI/Events of Note  Hemoglobin dropped from 11.4 to 8 No hemodynamic change S/p coil embolization of renal artery  eICU Interventions  transfuse if < 8 drop could be dilutional     Intervention Category Intermediate Interventions: Diagnostic test evaluation  Judd Lien 06/01/2022, 5:54 AM

## 2022-06-01 NOTE — Progress Notes (Signed)
Patient ID: Christian Dalton, male   DOB: 11-19-1941, 81 y.o.   MRN: [id number] [facility] Surgery Progress Note     Subjective: CC-  Patient reports less abdominal soreness. Tolerated a little of his breakfast. Mild nausea, no emesis. Passing flatus, no BM. Persistent tachycardia, 100s-110s. Intermittently hypotensive Hgb 8 from 8 today, from 11.4 yesterday  Objective: Vital signs in last 24 hours: Temp:  [97.9 F (36.6 C)-100.1 F (37.8 C)] 99.6 F (37.6 C) (07/09 0730) Pulse Rate:  [113-117] 113 (07/09 0600) Resp:  [20-26] 26 (07/09 0300) BP: (87-130)/(59-114) 130/114 (07/09 0600) SpO2:  [92 %-96 %] 95 % (07/09 0600) Weight:  [94.2 kg] 94.2 kg (07/08 1300) Last BM Date :  (PTA)  Intake/Output from previous day: 07/08 0701 - 07/09 0700 In: 0  Out: 250  Intake/Output this shift: No intake/output data recorded.  PE: Gen:  Alert, NAD Card: irregular, tachy low 100-110s Pulm:  rate and effort normal Abd: protuberant, soft, very mild left flank tenderness, +BS Skin: no rashes noted, warm and dry  Lab Results:  Recent Labs    05/31/22 0054 06/01/22 0210 06/01/22 0759  WBC 12.0* 10.3  --   HGB 11.4* 8.0* 8.0*  HCT 35.5* 23.9* 24.2*  PLT 77* 188  --    BMET Recent Labs    05/31/22 1009 06/01/22 0210  NA 137 133*  K 4.2 4.5  CL 93* 92*  CO2 25 25  GLUCOSE 142* 153*  BUN 40* 40*  CREATININE 6.72* 6.74*  CALCIUM 8.8* 8.8*   PT/INR Recent Labs    05/30/22 1051  LABPROT 17.1*  INR 1.4*   CMP     Component Value Date/Time   NA 133 (L) 06/01/2022 0210   K 4.5 06/01/2022 0210   CL 92 (L) 06/01/2022 0210   CO2 25 06/01/2022 0210   GLUCOSE 153 (H) 06/01/2022 0210   BUN 40 (H) 06/01/2022 0210   CREATININE 6.74 (H) 06/01/2022 0210   CALCIUM 8.8 (L) 06/01/2022 0210   PROT 4.9 (L) 05/30/2022 1051   ALBUMIN 2.5 (L) 05/31/2022 0235   AST 12 (L) 05/30/2022 1051   ALT 9 05/30/2022 1051   ALKPHOS 70 05/30/2022 1051   BILITOT 0.6 05/30/2022 1051   GFRNONAA  8 (L) 06/01/2022 0210   GFRAA 7 (L) 10/26/2019 1626   Lipase     Component Value Date/Time   LIPASE 18 05/30/2022 1051       Studies/Results: IR US Guide Vasc Access Right  Result Date: 05/31/2022 INDICATION: History of end-stage renal disease with fall earlier today, now with left-sided perisplenic hemorrhage and hypotension. Patient presents today for left-sided renal arteriogram and potential percutaneous embolization. EXAM: 1. ULTRASOUND GUIDANCE FOR ARTERIAL ACCESS 2. SELECTIVE LEFT RENAL ARTERIOGRAM 3. SUB SELECTIVE ARTERIOGRAM OF THE ANTERIOR DIVISION OF THE LEFT RENAL ARTERY 4. SUB SELECTIVE PERCUTANEOUS COIL EMBOLIZATION OF TWO SEPARATE TERTIARY ARTERIES OF THE ANTERIOR DIVISION OF THE LEFT RENAL ARTERY. 5. SUB SELECTIVE ARTERIOGRAM OF THE POSTERIOR DIVISION OF THE LEFT RENAL ARTERY COMPARISON:  CT abdomen and pelvis-earlier same day MEDICATIONS: None ANESTHESIA/SEDATION: Moderate (conscious) sedation was employed during this procedure. A total of Versed 1 mg and Fentanyl 100 mcg was administered intravenously. Moderate Sedation Time: 58 minutes. The patient's level of consciousness and vital signs were monitored continuously by radiology nursing throughout the procedure under my direct supervision. CONTRAST:  80 cc Omnipaque 300 FLUOROSCOPY TIME:  11 minutes, 12 seconds (3,016 mGy) COMPLICATIONS: None immediate. PROCEDURE: Informed consent was obtained from the patient and  the patient's wife following explanation of the procedure, risks, benefits and alternatives. All questions were addressed. A time out was performed prior to the initiation of the procedure. Maximal barrier sterile technique utilized including caps, mask, sterile gowns, sterile gloves, large sterile drape, hand hygiene, and Betadine prep. The right femoral head was marked fluoroscopically. Under sterile conditions and local anesthesia, the right common femoral artery access was performed with a micropuncture needle. Under  direct ultrasound guidance, the right common femoral was accessed with a micropuncture kit. An ultrasound image was saved for documentation purposes. This allowed for placement of a 5-French vascular sheath. A limited arteriogram was performed through the side arm of the sheath confirming appropriate access within the right common femoral artery. Over a Bentson wire, a Mickelson catheter was advanced the caudal aspect of the thoracic aorta where was reformed, back bled and flushed. The Mickelson catheter was then utilized to select the left renal artery and a selective left renal arteriogram was performed. Next, with the use of a fathom 14 microwire, a cantata microcatheter was utilized to select the anterior division of the left renal artery and a selective arteriogram was performed. Microcatheter was then utilized to select a tertiary branch of the anterior division of the left renal artery supplying an ill-defined area of active extravasation involving the inferior pole of the left kidney. The vessel was then percutaneously coil embolized with overlapping 2 mm and 3 mm interlock coils. Microcatheter was then utilized to select a separate tertiary branch of the anterior division of the left renal artery supplying an additional area of ill-defined active extravasation involving the inferior pole the left kidney. The vessel was then percutaneously coil embolized with overlapping 2 mm and 3 mm interlock coils. Microcatheter was retracted to the more proximal aspect of the anterior division of the left renal artery and a selective arteriogram was performed The microcatheter was then utilized to select the posterior division of the left renal artery and a selective arteriogram was performed The microcatheter was removed and a completion arteriogram was performed via the use of the Mickelson catheter. All images were reviewed and the procedure was terminated. All wires, catheters and sheaths were removed from the  patient. Hemostasis was achieved at the right groin access site with manual compression. A dressing was applied. The patient tolerated the procedure well without immediate postprocedural complication. FINDINGS: Selective left renal arteriogram demonstrates markedly sluggish flow through the left renal artery likely attributable to a combination of the patient's known end-stage renal disease as well as large left-sided perinephric hematoma. Sub selective injection of the anterior division of the left renal artery demonstrates 2 ill-defined areas of active extravasation involving the inferior pole of the left kidney, findings compatible with preceding CT. Both tertiary branches of the anterior division of the left renal artery were successfully catheterized and percutaneously coil embolized. Post embolization arteriogram was negative for areas of residual extravasation. Selective injection of the posterior division of the left renal artery and completion main left renal arteriogram were negative for additional areas of vessel irregularity or contrast extravasation. IMPRESSION: Successful percutaneous coil embolization of 2 tertiary branches of the anterior division of the left renal artery supplying 2 ill-defined areas of contrast extravasation involving the inferior pole of the left kidney. PLAN: - The patient is to remain flat for 4 hours with right leg straight. - Continued aggressive resuscitation at the discretion of the critical care service. I would not be surprised if the patient's hematocrit level has not yet  reached a nadir, however I am hopeful it will stabilize in the coming days. - Note, ultimately if additional percutaneous intervention is warranted, it would likely require a more global embolization placing the patient at risk of renal necrosis. Electronically Signed   By: Sandi Mariscal M.D.   On: 05/31/2022 11:58   IR EMBO ART  VEN HEMORR LYMPH EXTRAV  INC GUIDE ROADMAPPING  Result Date:  05/31/2022 INDICATION: History of end-stage renal disease with fall earlier today, now with left-sided perisplenic hemorrhage and hypotension. Patient presents today for left-sided renal arteriogram and potential percutaneous embolization. EXAM: 1. ULTRASOUND GUIDANCE FOR ARTERIAL ACCESS 2. SELECTIVE LEFT RENAL ARTERIOGRAM 3. SUB SELECTIVE ARTERIOGRAM OF THE ANTERIOR DIVISION OF THE LEFT RENAL ARTERY 4. SUB SELECTIVE PERCUTANEOUS COIL EMBOLIZATION OF TWO SEPARATE TERTIARY ARTERIES OF THE ANTERIOR DIVISION OF THE LEFT RENAL ARTERY. 5. SUB SELECTIVE ARTERIOGRAM OF THE POSTERIOR DIVISION OF THE LEFT RENAL ARTERY COMPARISON:  CT abdomen and pelvis-earlier same day MEDICATIONS: None ANESTHESIA/SEDATION: Moderate (conscious) sedation was employed during this procedure. A total of Versed 1 mg and Fentanyl 100 mcg was administered intravenously. Moderate Sedation Time: 58 minutes. The patient's level of consciousness and vital signs were monitored continuously by radiology nursing throughout the procedure under my direct supervision. CONTRAST:  80 cc Omnipaque 300 FLUOROSCOPY TIME:  11 minutes, 12 seconds (4,315 mGy) COMPLICATIONS: None immediate. PROCEDURE: Informed consent was obtained from the patient and the patient's wife following explanation of the procedure, risks, benefits and alternatives. All questions were addressed. A time out was performed prior to the initiation of the procedure. Maximal barrier sterile technique utilized including caps, mask, sterile gowns, sterile gloves, large sterile drape, hand hygiene, and Betadine prep. The right femoral head was marked fluoroscopically. Under sterile conditions and local anesthesia, the right common femoral artery access was performed with a micropuncture needle. Under direct ultrasound guidance, the right common femoral was accessed with a micropuncture kit. An ultrasound image was saved for documentation purposes. This allowed for placement of a 5-French vascular  sheath. A limited arteriogram was performed through the side arm of the sheath confirming appropriate access within the right common femoral artery. Over a Bentson wire, a Mickelson catheter was advanced the caudal aspect of the thoracic aorta where was reformed, back bled and flushed. The Mickelson catheter was then utilized to select the left renal artery and a selective left renal arteriogram was performed. Next, with the use of a fathom 14 microwire, a cantata microcatheter was utilized to select the anterior division of the left renal artery and a selective arteriogram was performed. Microcatheter was then utilized to select a tertiary branch of the anterior division of the left renal artery supplying an ill-defined area of active extravasation involving the inferior pole of the left kidney. The vessel was then percutaneously coil embolized with overlapping 2 mm and 3 mm interlock coils. Microcatheter was then utilized to select a separate tertiary branch of the anterior division of the left renal artery supplying an additional area of ill-defined active extravasation involving the inferior pole the left kidney. The vessel was then percutaneously coil embolized with overlapping 2 mm and 3 mm interlock coils. Microcatheter was retracted to the more proximal aspect of the anterior division of the left renal artery and a selective arteriogram was performed The microcatheter was then utilized to select the posterior division of the left renal artery and a selective arteriogram was performed The microcatheter was removed and a completion arteriogram was performed via the use of the  Mickelson catheter. All images were reviewed and the procedure was terminated. All wires, catheters and sheaths were removed from the patient. Hemostasis was achieved at the right groin access site with manual compression. A dressing was applied. The patient tolerated the procedure well without immediate postprocedural complication.  FINDINGS: Selective left renal arteriogram demonstrates markedly sluggish flow through the left renal artery likely attributable to a combination of the patient's known end-stage renal disease as well as large left-sided perinephric hematoma. Sub selective injection of the anterior division of the left renal artery demonstrates 2 ill-defined areas of active extravasation involving the inferior pole of the left kidney, findings compatible with preceding CT. Both tertiary branches of the anterior division of the left renal artery were successfully catheterized and percutaneously coil embolized. Post embolization arteriogram was negative for areas of residual extravasation. Selective injection of the posterior division of the left renal artery and completion main left renal arteriogram were negative for additional areas of vessel irregularity or contrast extravasation. IMPRESSION: Successful percutaneous coil embolization of 2 tertiary branches of the anterior division of the left renal artery supplying 2 ill-defined areas of contrast extravasation involving the inferior pole of the left kidney. PLAN: - The patient is to remain flat for 4 hours with right leg straight. - Continued aggressive resuscitation at the discretion of the critical care service. I would not be surprised if the patient's hematocrit level has not yet reached a nadir, however I am hopeful it will stabilize in the coming days. - Note, ultimately if additional percutaneous intervention is warranted, it would likely require a more global embolization placing the patient at risk of renal necrosis. Electronically Signed   By: Sandi Mariscal M.D.   On: 05/31/2022 11:58   IR Angiogram Selective Each Additional Vessel  Result Date: 05/31/2022 INDICATION: History of end-stage renal disease with fall earlier today, now with left-sided perisplenic hemorrhage and hypotension. Patient presents today for left-sided renal arteriogram and potential percutaneous  embolization. EXAM: 1. ULTRASOUND GUIDANCE FOR ARTERIAL ACCESS 2. SELECTIVE LEFT RENAL ARTERIOGRAM 3. SUB SELECTIVE ARTERIOGRAM OF THE ANTERIOR DIVISION OF THE LEFT RENAL ARTERY 4. SUB SELECTIVE PERCUTANEOUS COIL EMBOLIZATION OF TWO SEPARATE TERTIARY ARTERIES OF THE ANTERIOR DIVISION OF THE LEFT RENAL ARTERY. 5. SUB SELECTIVE ARTERIOGRAM OF THE POSTERIOR DIVISION OF THE LEFT RENAL ARTERY COMPARISON:  CT abdomen and pelvis-earlier same day MEDICATIONS: None ANESTHESIA/SEDATION: Moderate (conscious) sedation was employed during this procedure. A total of Versed 1 mg and Fentanyl 100 mcg was administered intravenously. Moderate Sedation Time: 58 minutes. The patient's level of consciousness and vital signs were monitored continuously by radiology nursing throughout the procedure under my direct supervision. CONTRAST:  80 cc Omnipaque 300 FLUOROSCOPY TIME:  11 minutes, 12 seconds (2,482 mGy) COMPLICATIONS: None immediate. PROCEDURE: Informed consent was obtained from the patient and the patient's wife following explanation of the procedure, risks, benefits and alternatives. All questions were addressed. A time out was performed prior to the initiation of the procedure. Maximal barrier sterile technique utilized including caps, mask, sterile gowns, sterile gloves, large sterile drape, hand hygiene, and Betadine prep. The right femoral head was marked fluoroscopically. Under sterile conditions and local anesthesia, the right common femoral artery access was performed with a micropuncture needle. Under direct ultrasound guidance, the right common femoral was accessed with a micropuncture kit. An ultrasound image was saved for documentation purposes. This allowed for placement of a 5-French vascular sheath. A limited arteriogram was performed through the side arm of the sheath confirming appropriate access within  the right common femoral artery. Over a Bentson wire, a Mickelson catheter was advanced the caudal aspect of the  thoracic aorta where was reformed, back bled and flushed. The Mickelson catheter was then utilized to select the left renal artery and a selective left renal arteriogram was performed. Next, with the use of a fathom 14 microwire, a cantata microcatheter was utilized to select the anterior division of the left renal artery and a selective arteriogram was performed. Microcatheter was then utilized to select a tertiary branch of the anterior division of the left renal artery supplying an ill-defined area of active extravasation involving the inferior pole of the left kidney. The vessel was then percutaneously coil embolized with overlapping 2 mm and 3 mm interlock coils. Microcatheter was then utilized to select a separate tertiary branch of the anterior division of the left renal artery supplying an additional area of ill-defined active extravasation involving the inferior pole the left kidney. The vessel was then percutaneously coil embolized with overlapping 2 mm and 3 mm interlock coils. Microcatheter was retracted to the more proximal aspect of the anterior division of the left renal artery and a selective arteriogram was performed The microcatheter was then utilized to select the posterior division of the left renal artery and a selective arteriogram was performed The microcatheter was removed and a completion arteriogram was performed via the use of the Mickelson catheter. All images were reviewed and the procedure was terminated. All wires, catheters and sheaths were removed from the patient. Hemostasis was achieved at the right groin access site with manual compression. A dressing was applied. The patient tolerated the procedure well without immediate postprocedural complication. FINDINGS: Selective left renal arteriogram demonstrates markedly sluggish flow through the left renal artery likely attributable to a combination of the patient's known end-stage renal disease as well as large left-sided perinephric  hematoma. Sub selective injection of the anterior division of the left renal artery demonstrates 2 ill-defined areas of active extravasation involving the inferior pole of the left kidney, findings compatible with preceding CT. Both tertiary branches of the anterior division of the left renal artery were successfully catheterized and percutaneously coil embolized. Post embolization arteriogram was negative for areas of residual extravasation. Selective injection of the posterior division of the left renal artery and completion main left renal arteriogram were negative for additional areas of vessel irregularity or contrast extravasation. IMPRESSION: Successful percutaneous coil embolization of 2 tertiary branches of the anterior division of the left renal artery supplying 2 ill-defined areas of contrast extravasation involving the inferior pole of the left kidney. PLAN: - The patient is to remain flat for 4 hours with right leg straight. - Continued aggressive resuscitation at the discretion of the critical care service. I would not be surprised if the patient's hematocrit level has not yet reached a nadir, however I am hopeful it will stabilize in the coming days. - Note, ultimately if additional percutaneous intervention is warranted, it would likely require a more global embolization placing the patient at risk of renal necrosis. Electronically Signed   By: Sandi Mariscal M.D.   On: 05/31/2022 11:58   IR Angiogram Selective Each Additional Vessel  Result Date: 05/31/2022 INDICATION: History of end-stage renal disease with fall earlier today, now with left-sided perisplenic hemorrhage and hypotension. Patient presents today for left-sided renal arteriogram and potential percutaneous embolization. EXAM: 1. ULTRASOUND GUIDANCE FOR ARTERIAL ACCESS 2. SELECTIVE LEFT RENAL ARTERIOGRAM 3. SUB SELECTIVE ARTERIOGRAM OF THE ANTERIOR DIVISION OF THE LEFT RENAL ARTERY 4. SUB  SELECTIVE PERCUTANEOUS COIL EMBOLIZATION OF TWO  SEPARATE TERTIARY ARTERIES OF THE ANTERIOR DIVISION OF THE LEFT RENAL ARTERY. 5. SUB SELECTIVE ARTERIOGRAM OF THE POSTERIOR DIVISION OF THE LEFT RENAL ARTERY COMPARISON:  CT abdomen and pelvis-earlier same day MEDICATIONS: None ANESTHESIA/SEDATION: Moderate (conscious) sedation was employed during this procedure. A total of Versed 1 mg and Fentanyl 100 mcg was administered intravenously. Moderate Sedation Time: 58 minutes. The patient's level of consciousness and vital signs were monitored continuously by radiology nursing throughout the procedure under my direct supervision. CONTRAST:  80 cc Omnipaque 300 FLUOROSCOPY TIME:  11 minutes, 12 seconds (8,250 mGy) COMPLICATIONS: None immediate. PROCEDURE: Informed consent was obtained from the patient and the patient's wife following explanation of the procedure, risks, benefits and alternatives. All questions were addressed. A time out was performed prior to the initiation of the procedure. Maximal barrier sterile technique utilized including caps, mask, sterile gowns, sterile gloves, large sterile drape, hand hygiene, and Betadine prep. The right femoral head was marked fluoroscopically. Under sterile conditions and local anesthesia, the right common femoral artery access was performed with a micropuncture needle. Under direct ultrasound guidance, the right common femoral was accessed with a micropuncture kit. An ultrasound image was saved for documentation purposes. This allowed for placement of a 5-French vascular sheath. A limited arteriogram was performed through the side arm of the sheath confirming appropriate access within the right common femoral artery. Over a Bentson wire, a Mickelson catheter was advanced the caudal aspect of the thoracic aorta where was reformed, back bled and flushed. The Mickelson catheter was then utilized to select the left renal artery and a selective left renal arteriogram was performed. Next, with the use of a fathom 14 microwire, a  cantata microcatheter was utilized to select the anterior division of the left renal artery and a selective arteriogram was performed. Microcatheter was then utilized to select a tertiary branch of the anterior division of the left renal artery supplying an ill-defined area of active extravasation involving the inferior pole of the left kidney. The vessel was then percutaneously coil embolized with overlapping 2 mm and 3 mm interlock coils. Microcatheter was then utilized to select a separate tertiary branch of the anterior division of the left renal artery supplying an additional area of ill-defined active extravasation involving the inferior pole the left kidney. The vessel was then percutaneously coil embolized with overlapping 2 mm and 3 mm interlock coils. Microcatheter was retracted to the more proximal aspect of the anterior division of the left renal artery and a selective arteriogram was performed The microcatheter was then utilized to select the posterior division of the left renal artery and a selective arteriogram was performed The microcatheter was removed and a completion arteriogram was performed via the use of the Mickelson catheter. All images were reviewed and the procedure was terminated. All wires, catheters and sheaths were removed from the patient. Hemostasis was achieved at the right groin access site with manual compression. A dressing was applied. The patient tolerated the procedure well without immediate postprocedural complication. FINDINGS: Selective left renal arteriogram demonstrates markedly sluggish flow through the left renal artery likely attributable to a combination of the patient's known end-stage renal disease as well as large left-sided perinephric hematoma. Sub selective injection of the anterior division of the left renal artery demonstrates 2 ill-defined areas of active extravasation involving the inferior pole of the left kidney, findings compatible with preceding CT. Both  tertiary branches of the anterior division of the left renal artery were  successfully catheterized and percutaneously coil embolized. Post embolization arteriogram was negative for areas of residual extravasation. Selective injection of the posterior division of the left renal artery and completion main left renal arteriogram were negative for additional areas of vessel irregularity or contrast extravasation. IMPRESSION: Successful percutaneous coil embolization of 2 tertiary branches of the anterior division of the left renal artery supplying 2 ill-defined areas of contrast extravasation involving the inferior pole of the left kidney. PLAN: - The patient is to remain flat for 4 hours with right leg straight. - Continued aggressive resuscitation at the discretion of the critical care service. I would not be surprised if the patient's hematocrit level has not yet reached a nadir, however I am hopeful it will stabilize in the coming days. - Note, ultimately if additional percutaneous intervention is warranted, it would likely require a more global embolization placing the patient at risk of renal necrosis. Electronically Signed   By: Sandi Mariscal M.D.   On: 05/31/2022 11:58   IR Angiogram Selective Each Additional Vessel  Result Date: 05/31/2022 INDICATION: History of end-stage renal disease with fall earlier today, now with left-sided perisplenic hemorrhage and hypotension. Patient presents today for left-sided renal arteriogram and potential percutaneous embolization. EXAM: 1. ULTRASOUND GUIDANCE FOR ARTERIAL ACCESS 2. SELECTIVE LEFT RENAL ARTERIOGRAM 3. SUB SELECTIVE ARTERIOGRAM OF THE ANTERIOR DIVISION OF THE LEFT RENAL ARTERY 4. SUB SELECTIVE PERCUTANEOUS COIL EMBOLIZATION OF TWO SEPARATE TERTIARY ARTERIES OF THE ANTERIOR DIVISION OF THE LEFT RENAL ARTERY. 5. SUB SELECTIVE ARTERIOGRAM OF THE POSTERIOR DIVISION OF THE LEFT RENAL ARTERY COMPARISON:  CT abdomen and pelvis-earlier same day MEDICATIONS: None  ANESTHESIA/SEDATION: Moderate (conscious) sedation was employed during this procedure. A total of Versed 1 mg and Fentanyl 100 mcg was administered intravenously. Moderate Sedation Time: 58 minutes. The patient's level of consciousness and vital signs were monitored continuously by radiology nursing throughout the procedure under my direct supervision. CONTRAST:  80 cc Omnipaque 300 FLUOROSCOPY TIME:  11 minutes, 12 seconds (4,235 mGy) COMPLICATIONS: None immediate. PROCEDURE: Informed consent was obtained from the patient and the patient's wife following explanation of the procedure, risks, benefits and alternatives. All questions were addressed. A time out was performed prior to the initiation of the procedure. Maximal barrier sterile technique utilized including caps, mask, sterile gowns, sterile gloves, large sterile drape, hand hygiene, and Betadine prep. The right femoral head was marked fluoroscopically. Under sterile conditions and local anesthesia, the right common femoral artery access was performed with a micropuncture needle. Under direct ultrasound guidance, the right common femoral was accessed with a micropuncture kit. An ultrasound image was saved for documentation purposes. This allowed for placement of a 5-French vascular sheath. A limited arteriogram was performed through the side arm of the sheath confirming appropriate access within the right common femoral artery. Over a Bentson wire, a Mickelson catheter was advanced the caudal aspect of the thoracic aorta where was reformed, back bled and flushed. The Mickelson catheter was then utilized to select the left renal artery and a selective left renal arteriogram was performed. Next, with the use of a fathom 14 microwire, a cantata microcatheter was utilized to select the anterior division of the left renal artery and a selective arteriogram was performed. Microcatheter was then utilized to select a tertiary branch of the anterior division of the  left renal artery supplying an ill-defined area of active extravasation involving the inferior pole of the left kidney. The vessel was then percutaneously coil embolized with overlapping 2 mm and 3 mm  interlock coils. Microcatheter was then utilized to select a separate tertiary branch of the anterior division of the left renal artery supplying an additional area of ill-defined active extravasation involving the inferior pole the left kidney. The vessel was then percutaneously coil embolized with overlapping 2 mm and 3 mm interlock coils. Microcatheter was retracted to the more proximal aspect of the anterior division of the left renal artery and a selective arteriogram was performed The microcatheter was then utilized to select the posterior division of the left renal artery and a selective arteriogram was performed The microcatheter was removed and a completion arteriogram was performed via the use of the Mickelson catheter. All images were reviewed and the procedure was terminated. All wires, catheters and sheaths were removed from the patient. Hemostasis was achieved at the right groin access site with manual compression. A dressing was applied. The patient tolerated the procedure well without immediate postprocedural complication. FINDINGS: Selective left renal arteriogram demonstrates markedly sluggish flow through the left renal artery likely attributable to a combination of the patient's known end-stage renal disease as well as large left-sided perinephric hematoma. Sub selective injection of the anterior division of the left renal artery demonstrates 2 ill-defined areas of active extravasation involving the inferior pole of the left kidney, findings compatible with preceding CT. Both tertiary branches of the anterior division of the left renal artery were successfully catheterized and percutaneously coil embolized. Post embolization arteriogram was negative for areas of residual extravasation. Selective  injection of the posterior division of the left renal artery and completion main left renal arteriogram were negative for additional areas of vessel irregularity or contrast extravasation. IMPRESSION: Successful percutaneous coil embolization of 2 tertiary branches of the anterior division of the left renal artery supplying 2 ill-defined areas of contrast extravasation involving the inferior pole of the left kidney. PLAN: - The patient is to remain flat for 4 hours with right leg straight. - Continued aggressive resuscitation at the discretion of the critical care service. I would not be surprised if the patient's hematocrit level has not yet reached a nadir, however I am hopeful it will stabilize in the coming days. - Note, ultimately if additional percutaneous intervention is warranted, it would likely require a more global embolization placing the patient at risk of renal necrosis. Electronically Signed   By: Sandi Mariscal M.D.   On: 05/31/2022 11:58   IR Angiogram Selective Each Additional Vessel  Result Date: 05/31/2022 INDICATION: History of end-stage renal disease with fall earlier today, now with left-sided perisplenic hemorrhage and hypotension. Patient presents today for left-sided renal arteriogram and potential percutaneous embolization. EXAM: 1. ULTRASOUND GUIDANCE FOR ARTERIAL ACCESS 2. SELECTIVE LEFT RENAL ARTERIOGRAM 3. SUB SELECTIVE ARTERIOGRAM OF THE ANTERIOR DIVISION OF THE LEFT RENAL ARTERY 4. SUB SELECTIVE PERCUTANEOUS COIL EMBOLIZATION OF TWO SEPARATE TERTIARY ARTERIES OF THE ANTERIOR DIVISION OF THE LEFT RENAL ARTERY. 5. SUB SELECTIVE ARTERIOGRAM OF THE POSTERIOR DIVISION OF THE LEFT RENAL ARTERY COMPARISON:  CT abdomen and pelvis-earlier same day MEDICATIONS: None ANESTHESIA/SEDATION: Moderate (conscious) sedation was employed during this procedure. A total of Versed 1 mg and Fentanyl 100 mcg was administered intravenously. Moderate Sedation Time: 58 minutes. The patient's level of  consciousness and vital signs were monitored continuously by radiology nursing throughout the procedure under my direct supervision. CONTRAST:  80 cc Omnipaque 300 FLUOROSCOPY TIME:  11 minutes, 12 seconds (1,601 mGy) COMPLICATIONS: None immediate. PROCEDURE: Informed consent was obtained from the patient and the patient's wife following explanation of the procedure, risks,  benefits and alternatives. All questions were addressed. A time out was performed prior to the initiation of the procedure. Maximal barrier sterile technique utilized including caps, mask, sterile gowns, sterile gloves, large sterile drape, hand hygiene, and Betadine prep. The right femoral head was marked fluoroscopically. Under sterile conditions and local anesthesia, the right common femoral artery access was performed with a micropuncture needle. Under direct ultrasound guidance, the right common femoral was accessed with a micropuncture kit. An ultrasound image was saved for documentation purposes. This allowed for placement of a 5-French vascular sheath. A limited arteriogram was performed through the side arm of the sheath confirming appropriate access within the right common femoral artery. Over a Bentson wire, a Mickelson catheter was advanced the caudal aspect of the thoracic aorta where was reformed, back bled and flushed. The Mickelson catheter was then utilized to select the left renal artery and a selective left renal arteriogram was performed. Next, with the use of a fathom 14 microwire, a cantata microcatheter was utilized to select the anterior division of the left renal artery and a selective arteriogram was performed. Microcatheter was then utilized to select a tertiary branch of the anterior division of the left renal artery supplying an ill-defined area of active extravasation involving the inferior pole of the left kidney. The vessel was then percutaneously coil embolized with overlapping 2 mm and 3 mm interlock coils.  Microcatheter was then utilized to select a separate tertiary branch of the anterior division of the left renal artery supplying an additional area of ill-defined active extravasation involving the inferior pole the left kidney. The vessel was then percutaneously coil embolized with overlapping 2 mm and 3 mm interlock coils. Microcatheter was retracted to the more proximal aspect of the anterior division of the left renal artery and a selective arteriogram was performed The microcatheter was then utilized to select the posterior division of the left renal artery and a selective arteriogram was performed The microcatheter was removed and a completion arteriogram was performed via the use of the Mickelson catheter. All images were reviewed and the procedure was terminated. All wires, catheters and sheaths were removed from the patient. Hemostasis was achieved at the right groin access site with manual compression. A dressing was applied. The patient tolerated the procedure well without immediate postprocedural complication. FINDINGS: Selective left renal arteriogram demonstrates markedly sluggish flow through the left renal artery likely attributable to a combination of the patient's known end-stage renal disease as well as large left-sided perinephric hematoma. Sub selective injection of the anterior division of the left renal artery demonstrates 2 ill-defined areas of active extravasation involving the inferior pole of the left kidney, findings compatible with preceding CT. Both tertiary branches of the anterior division of the left renal artery were successfully catheterized and percutaneously coil embolized. Post embolization arteriogram was negative for areas of residual extravasation. Selective injection of the posterior division of the left renal artery and completion main left renal arteriogram were negative for additional areas of vessel irregularity or contrast extravasation. IMPRESSION: Successful  percutaneous coil embolization of 2 tertiary branches of the anterior division of the left renal artery supplying 2 ill-defined areas of contrast extravasation involving the inferior pole of the left kidney. PLAN: - The patient is to remain flat for 4 hours with right leg straight. - Continued aggressive resuscitation at the discretion of the critical care service. I would not be surprised if the patient's hematocrit level has not yet reached a nadir, however I am hopeful it will  stabilize in the coming days. - Note, ultimately if additional percutaneous intervention is warranted, it would likely require a more global embolization placing the patient at risk of renal necrosis. Electronically Signed   By: Sandi Mariscal M.D.   On: 05/31/2022 11:58   IR Angiogram Selective Each Additional Vessel  Result Date: 05/31/2022 INDICATION: History of end-stage renal disease with fall earlier today, now with left-sided perisplenic hemorrhage and hypotension. Patient presents today for left-sided renal arteriogram and potential percutaneous embolization. EXAM: 1. ULTRASOUND GUIDANCE FOR ARTERIAL ACCESS 2. SELECTIVE LEFT RENAL ARTERIOGRAM 3. SUB SELECTIVE ARTERIOGRAM OF THE ANTERIOR DIVISION OF THE LEFT RENAL ARTERY 4. SUB SELECTIVE PERCUTANEOUS COIL EMBOLIZATION OF TWO SEPARATE TERTIARY ARTERIES OF THE ANTERIOR DIVISION OF THE LEFT RENAL ARTERY. 5. SUB SELECTIVE ARTERIOGRAM OF THE POSTERIOR DIVISION OF THE LEFT RENAL ARTERY COMPARISON:  CT abdomen and pelvis-earlier same day MEDICATIONS: None ANESTHESIA/SEDATION: Moderate (conscious) sedation was employed during this procedure. A total of Versed 1 mg and Fentanyl 100 mcg was administered intravenously. Moderate Sedation Time: 58 minutes. The patient's level of consciousness and vital signs were monitored continuously by radiology nursing throughout the procedure under my direct supervision. CONTRAST:  80 cc Omnipaque 300 FLUOROSCOPY TIME:  11 minutes, 12 seconds (6,568 mGy)  COMPLICATIONS: None immediate. PROCEDURE: Informed consent was obtained from the patient and the patient's wife following explanation of the procedure, risks, benefits and alternatives. All questions were addressed. A time out was performed prior to the initiation of the procedure. Maximal barrier sterile technique utilized including caps, mask, sterile gowns, sterile gloves, large sterile drape, hand hygiene, and Betadine prep. The right femoral head was marked fluoroscopically. Under sterile conditions and local anesthesia, the right common femoral artery access was performed with a micropuncture needle. Under direct ultrasound guidance, the right common femoral was accessed with a micropuncture kit. An ultrasound image was saved for documentation purposes. This allowed for placement of a 5-French vascular sheath. A limited arteriogram was performed through the side arm of the sheath confirming appropriate access within the right common femoral artery. Over a Bentson wire, a Mickelson catheter was advanced the caudal aspect of the thoracic aorta where was reformed, back bled and flushed. The Mickelson catheter was then utilized to select the left renal artery and a selective left renal arteriogram was performed. Next, with the use of a fathom 14 microwire, a cantata microcatheter was utilized to select the anterior division of the left renal artery and a selective arteriogram was performed. Microcatheter was then utilized to select a tertiary branch of the anterior division of the left renal artery supplying an ill-defined area of active extravasation involving the inferior pole of the left kidney. The vessel was then percutaneously coil embolized with overlapping 2 mm and 3 mm interlock coils. Microcatheter was then utilized to select a separate tertiary branch of the anterior division of the left renal artery supplying an additional area of ill-defined active extravasation involving the inferior pole the left  kidney. The vessel was then percutaneously coil embolized with overlapping 2 mm and 3 mm interlock coils. Microcatheter was retracted to the more proximal aspect of the anterior division of the left renal artery and a selective arteriogram was performed The microcatheter was then utilized to select the posterior division of the left renal artery and a selective arteriogram was performed The microcatheter was removed and a completion arteriogram was performed via the use of the Mickelson catheter. All images were reviewed and the procedure was terminated. All wires, catheters and  sheaths were removed from the patient. Hemostasis was achieved at the right groin access site with manual compression. A dressing was applied. The patient tolerated the procedure well without immediate postprocedural complication. FINDINGS: Selective left renal arteriogram demonstrates markedly sluggish flow through the left renal artery likely attributable to a combination of the patient's known end-stage renal disease as well as large left-sided perinephric hematoma. Sub selective injection of the anterior division of the left renal artery demonstrates 2 ill-defined areas of active extravasation involving the inferior pole of the left kidney, findings compatible with preceding CT. Both tertiary branches of the anterior division of the left renal artery were successfully catheterized and percutaneously coil embolized. Post embolization arteriogram was negative for areas of residual extravasation. Selective injection of the posterior division of the left renal artery and completion main left renal arteriogram were negative for additional areas of vessel irregularity or contrast extravasation. IMPRESSION: Successful percutaneous coil embolization of 2 tertiary branches of the anterior division of the left renal artery supplying 2 ill-defined areas of contrast extravasation involving the inferior pole of the left kidney. PLAN: - The patient is  to remain flat for 4 hours with right leg straight. - Continued aggressive resuscitation at the discretion of the critical care service. I would not be surprised if the patient's hematocrit level has not yet reached a nadir, however I am hopeful it will stabilize in the coming days. - Note, ultimately if additional percutaneous intervention is warranted, it would likely require a more global embolization placing the patient at risk of renal necrosis. Electronically Signed   By: Sandi Mariscal M.D.   On: 05/31/2022 11:58   IR Angiogram Selective Each Additional Vessel  Result Date: 05/31/2022 INDICATION: History of end-stage renal disease with fall earlier today, now with left-sided perisplenic hemorrhage and hypotension. Patient presents today for left-sided renal arteriogram and potential percutaneous embolization. EXAM: 1. ULTRASOUND GUIDANCE FOR ARTERIAL ACCESS 2. SELECTIVE LEFT RENAL ARTERIOGRAM 3. SUB SELECTIVE ARTERIOGRAM OF THE ANTERIOR DIVISION OF THE LEFT RENAL ARTERY 4. SUB SELECTIVE PERCUTANEOUS COIL EMBOLIZATION OF TWO SEPARATE TERTIARY ARTERIES OF THE ANTERIOR DIVISION OF THE LEFT RENAL ARTERY. 5. SUB SELECTIVE ARTERIOGRAM OF THE POSTERIOR DIVISION OF THE LEFT RENAL ARTERY COMPARISON:  CT abdomen and pelvis-earlier same day MEDICATIONS: None ANESTHESIA/SEDATION: Moderate (conscious) sedation was employed during this procedure. A total of Versed 1 mg and Fentanyl 100 mcg was administered intravenously. Moderate Sedation Time: 58 minutes. The patient's level of consciousness and vital signs were monitored continuously by radiology nursing throughout the procedure under my direct supervision. CONTRAST:  80 cc Omnipaque 300 FLUOROSCOPY TIME:  11 minutes, 12 seconds (0,175 mGy) COMPLICATIONS: None immediate. PROCEDURE: Informed consent was obtained from the patient and the patient's wife following explanation of the procedure, risks, benefits and alternatives. All questions were addressed. A time out was  performed prior to the initiation of the procedure. Maximal barrier sterile technique utilized including caps, mask, sterile gowns, sterile gloves, large sterile drape, hand hygiene, and Betadine prep. The right femoral head was marked fluoroscopically. Under sterile conditions and local anesthesia, the right common femoral artery access was performed with a micropuncture needle. Under direct ultrasound guidance, the right common femoral was accessed with a micropuncture kit. An ultrasound image was saved for documentation purposes. This allowed for placement of a 5-French vascular sheath. A limited arteriogram was performed through the side arm of the sheath confirming appropriate access within the right common femoral artery. Over a Bentson wire, a Mickelson catheter was advanced the  caudal aspect of the thoracic aorta where was reformed, back bled and flushed. The Mickelson catheter was then utilized to select the left renal artery and a selective left renal arteriogram was performed. Next, with the use of a fathom 14 microwire, a cantata microcatheter was utilized to select the anterior division of the left renal artery and a selective arteriogram was performed. Microcatheter was then utilized to select a tertiary branch of the anterior division of the left renal artery supplying an ill-defined area of active extravasation involving the inferior pole of the left kidney. The vessel was then percutaneously coil embolized with overlapping 2 mm and 3 mm interlock coils. Microcatheter was then utilized to select a separate tertiary branch of the anterior division of the left renal artery supplying an additional area of ill-defined active extravasation involving the inferior pole the left kidney. The vessel was then percutaneously coil embolized with overlapping 2 mm and 3 mm interlock coils. Microcatheter was retracted to the more proximal aspect of the anterior division of the left renal artery and a selective  arteriogram was performed The microcatheter was then utilized to select the posterior division of the left renal artery and a selective arteriogram was performed The microcatheter was removed and a completion arteriogram was performed via the use of the Mickelson catheter. All images were reviewed and the procedure was terminated. All wires, catheters and sheaths were removed from the patient. Hemostasis was achieved at the right groin access site with manual compression. A dressing was applied. The patient tolerated the procedure well without immediate postprocedural complication. FINDINGS: Selective left renal arteriogram demonstrates markedly sluggish flow through the left renal artery likely attributable to a combination of the patient's known end-stage renal disease as well as large left-sided perinephric hematoma. Sub selective injection of the anterior division of the left renal artery demonstrates 2 ill-defined areas of active extravasation involving the inferior pole of the left kidney, findings compatible with preceding CT. Both tertiary branches of the anterior division of the left renal artery were successfully catheterized and percutaneously coil embolized. Post embolization arteriogram was negative for areas of residual extravasation. Selective injection of the posterior division of the left renal artery and completion main left renal arteriogram were negative for additional areas of vessel irregularity or contrast extravasation. IMPRESSION: Successful percutaneous coil embolization of 2 tertiary branches of the anterior division of the left renal artery supplying 2 ill-defined areas of contrast extravasation involving the inferior pole of the left kidney. PLAN: - The patient is to remain flat for 4 hours with right leg straight. - Continued aggressive resuscitation at the discretion of the critical care service. I would not be surprised if the patient's hematocrit level has not yet reached a nadir,  however I am hopeful it will stabilize in the coming days. - Note, ultimately if additional percutaneous intervention is warranted, it would likely require a more global embolization placing the patient at risk of renal necrosis. Electronically Signed   By: Sandi Mariscal M.D.   On: 05/31/2022 11:58   DG Knee Left Port  Result Date: 05/30/2022 CLINICAL DATA:  Fall EXAM: PORTABLE LEFT KNEE - 1-2 VIEW COMPARISON:  None Available. FINDINGS: No acute fracture or dislocation of the left knee. Plate and screw fixation of the lateral tibial plateau with underlying, chronic, sclerotic fracture. Joint spaces are well preserved. No knee joint effusion. Vascular calcinosis. IMPRESSION: No acute fracture or dislocation of the left knee. Plate and screw fixation of the lateral tibial plateau with underlying, chronic,  sclerotic fracture. Electronically Signed   By: Delanna Ahmadi M.D.   On: 05/30/2022 16:40   CT ABDOMEN PELVIS W CONTRAST  Result Date: 05/30/2022 CLINICAL DATA:  Nonlocalized abdominal pain. EXAM: CT ABDOMEN AND PELVIS WITH CONTRAST TECHNIQUE: Multidetector CT imaging of the abdomen and pelvis was performed using the standard protocol following bolus administration of intravenous contrast. RADIATION DOSE REDUCTION: This exam was performed according to the departmental dose-optimization program which includes automated exposure control, adjustment of the mA and/or kV according to patient size and/or use of iterative reconstruction technique. CONTRAST:  173m OMNIPAQUE IOHEXOL 350 MG/ML SOLN COMPARISON:  Abdomen/pelvis CT 04/07/2019 FINDINGS: Lower chest: Please see report for chest CTA performed at the same time. Hepatobiliary: No suspicious focal abnormality within the liver parenchyma. Gallbladder is surgically absent. Pneumobilia suggests prior sphincterotomy. Pancreas: No focal mass lesion. No dilatation of the main duct. No intraparenchymal cyst. No peripancreatic edema. Spleen: No splenomegaly. No focal mass  lesion. Adrenals/Urinary Tract: Stable 14 mm left adrenal nodule consistent with adenoma. No followup recommended. Right adrenal gland unremarkable. Innumerable small cystic lesions identified in the right kidney, the more [place] which are too small to characterize on this exam. Innumerable cystic foci are seen in the left kidney with a large perinephric heterogeneous collection compatible with hematoma. Hematoma is probably subcapsular and measures 14.9 x 6.4 x 17.3 cm. Foci of increased attenuation in the interpolar left kidney between the parenchyma and the hematoma (axial 46/1 and coronal 87/6) raise the question of active extravasation. There is stranding around the left kidney compatible with edema/hemorrhage. Hemorrhage extends down the retroperitoneum into the left pelvic sidewall. Hematoma generates mass-effect on the left kidney. No hydroureteronephrosis. Bladder is decompressed. Stomach/Bowel: Tiny hiatal hernia. Stomach otherwise unremarkable. Duodenum is normally positioned as is the ligament of Treitz. No small bowel wall thickening. No small bowel dilatation. The terminal ileum is normal. The appendix is normal. No gross colonic mass. No colonic wall thickening. Vascular/Lymphatic: There is advanced atherosclerotic calcification of the abdominal aorta without aneurysm. There is no gastrohepatic or hepatoduodenal ligament lymphadenopathy. No retroperitoneal or mesenteric lymphadenopathy. No pelvic sidewall lymphadenopathy. Reproductive: The prostate gland and seminal vesicles are unremarkable. Other: No substantial intraperitoneal free fluid. Musculoskeletal: No worrisome lytic or sclerotic osseous abnormality. Bilateral pars interarticularis defects noted at L5. IMPRESSION: 1. Markedly large left perinephric/subcapsular hematoma involving the left kidney. There are adjacent foci of ill-defined increased attenuation at the margin of the kidney and the hematoma raising concern for active or ongoing  bleeding. Hemorrhage extends in the retroperitoneal tissues inferiorly to the extraperitoneal left pelvic sidewall. Follow-up recommended to exclude hemorrhage secondary to underlying mass lesion. 2. Innumerable tiny cystic lesions in both kidneys, too small to characterize and progressive in the interval since 2020. 3. Pneumobilia suggests prior sphincterotomy. 4. 14 mm left adrenal adenoma. Critical Value/emergent results were called by telephone at the time of interpretation on 05/30/2022 at 12:14 pm [facility] who verbally acknowledged these results. Electronically Signed   By: EMisty StanleyM.D.   On: 05/30/2022 12:15   CT Angio Chest PE W and/or Wo Contrast  Result Date: 05/30/2022 CLINICAL DATA:  Nonlocalized abdominal pain. EXAM: CT ABDOMEN AND PELVIS WITH CONTRAST TECHNIQUE: Multidetector CT imaging of the abdomen and pelvis was performed using the standard protocol following bolus administration of intravenous contrast. RADIATION DOSE REDUCTION: This exam was performed according to the departmental dose-optimization program which includes automated exposure control, adjustment of the mA and/or kV according to patient size  and/or use of iterative reconstruction technique. CONTRAST:  159m OMNIPAQUE IOHEXOL 350 MG/ML SOLN COMPARISON:  Abdomen/pelvis CT 04/07/2019 FINDINGS: Lower chest: Please see report for chest CTA performed at the same time. Hepatobiliary: No suspicious focal abnormality within the liver parenchyma. Gallbladder is surgically absent. Pneumobilia suggests prior sphincterotomy. Pancreas: No focal mass lesion. No dilatation of the main duct. No intraparenchymal cyst. No peripancreatic edema. Spleen: No splenomegaly. No focal mass lesion. Adrenals/Urinary Tract: Stable 14 mm left adrenal nodule consistent with adenoma. No followup recommended. Right adrenal gland unremarkable. Innumerable small cystic lesions identified in the right kidney, the more [place] which are too  small to characterize on this exam. Innumerable cystic foci are seen in the left kidney with a large perinephric heterogeneous collection compatible with hematoma. Hematoma is probably subcapsular and measures 14.9 x 6.4 x 17.3 cm. Foci of increased attenuation in the interpolar left kidney between the parenchyma and the hematoma (axial 46/1 and coronal 87/6) raise the question of active extravasation. There is stranding around the left kidney compatible with edema/hemorrhage. Hemorrhage extends down the retroperitoneum into the left pelvic sidewall. Hematoma generates mass-effect on the left kidney. No hydroureteronephrosis. Bladder is decompressed. Stomach/Bowel: Tiny hiatal hernia. Stomach otherwise unremarkable. Duodenum is normally positioned as is the ligament of Treitz. No small bowel wall thickening. No small bowel dilatation. The terminal ileum is normal. The appendix is normal. No gross colonic mass. No colonic wall thickening. Vascular/Lymphatic: There is advanced atherosclerotic calcification of the abdominal aorta without aneurysm. There is no gastrohepatic or hepatoduodenal ligament lymphadenopathy. No retroperitoneal or mesenteric lymphadenopathy. No pelvic sidewall lymphadenopathy. Reproductive: The prostate gland and seminal vesicles are unremarkable. Other: No substantial intraperitoneal free fluid. Musculoskeletal: No worrisome lytic or sclerotic osseous abnormality. Bilateral pars interarticularis defects noted at L5. IMPRESSION: 1. Markedly large left perinephric/subcapsular hematoma involving the left kidney. There are adjacent foci of ill-defined increased attenuation at the margin of the kidney and the hematoma raising concern for active or ongoing bleeding. Hemorrhage extends in the retroperitoneal tissues inferiorly to the extraperitoneal left pelvic sidewall. Follow-up recommended to exclude hemorrhage secondary to underlying mass lesion. 2. Innumerable tiny cystic lesions in both kidneys,  too small to characterize and progressive in the interval since 2020. 3. Pneumobilia suggests prior sphincterotomy. 4. 14 mm left adrenal adenoma. Critical Value/emergent results were called by telephone at the time of interpretation on 05/30/2022 at 12:14 pm to [facility], who verbally acknowledged these results. Electronically Signed   By: EMisty StanleyM.D.   On: 05/30/2022 12:15   DG Chest 1 View  Result Date: 05/30/2022 CLINICAL DATA:  Questionable sepsis. EXAM: CHEST  1 VIEW COMPARISON:  01/02/2022 FINDINGS: Stable cardiomediastinal contours. Lung volumes are low. No pleural effusion or edema. No airspace opacities identified. Visualized osseous structures appear intact. IMPRESSION: Low lung volumes. Electronically Signed   By: TKerby MoorsM.D.   On: 05/30/2022 11:53    Anti-infectives: Anti-infectives (From admission, onward)    None        Assessment/Plan Fall  Left perinephric/subcapsular hematoma with possible active extravasation and hemorrhagic shock - s/p IR left renal arteriogram and subselective embolization of two subsegmental branches of the left renal artery supplying two discrete areas of active extravasation 7/7 - s/p 3 units PRBCs 7/7.  - Hgb 8 from 8 today, from 11.4 yesterday. Continue serial h/h checks  Abrasions - local wound care   FEN - renal/CM diet VTE - SCDs only due to bleed ID - no current abx  ESRD on HD T/TR/Sat HTN COPD OSA CAD A. Fib Hypothyroidism T2DM Depression  PAD   I reviewed Consultant IR and nephrology notes, CCM  notes, last 24 h vitals and pain scores, last 48 h intake and output, last 24 h labs and trends    LOS: 2 days    Wellington Hampshire, [facility] 06/01/2022, 9:48 AM Please see Amion for pager number during day hours 7:00am-4:30pm

## 2022-06-01 NOTE — Progress Notes (Signed)
Progress Note  Patient: Christian Dalton EHM:[id number] DOB: [birth year]  DOA: 05/30/2022  DOS: 06/01/2022    Brief hospital course: Christian Dalton is a 81 y.o. male with a history of ESRD (TTS), PAD, CAD, atrial fibrillation, T2DM, COPD who presented to the ED on the evening of 7/7 feeling unwell, lightheaded, after a fall onto his left side earlier that day. Work up revealed Worsened anemia, hgb 7.5g/dl, and imaging revealed a large left perinephric/subcapsular hematoma. He was admitted to the ICU, given 3u PRBCs, and IR performed coil embolization of 2 tertiary branches of anterior division of left renal artery with active extravasation during arteriogram.   Assessment and Plan: Left perinephric hematoma: s/p embolization of subsegmental branches of left renal artery by IR 7/7. No evidence of GI bleeding currently. - Urology and trauma surgery following.  - Hold aspirin and anticoagulation.  Acute blood loss anemia on anemia of ESRD: Due to hematoma.  - Hgb precipitously dropped further this morning to 8 (after 3u given for hgb 7.5), but stabilized. With rising tachycardia, soft BP, will plan to transfuse for hgb of < 8 on serial H/H today. - ESA planned 7/11 per nephrology   Paroxysmal atrial fibrillation with RVR: Worsened in setting of anemia. Was in sinus tachycardia on admission. - Hypotension limits options, rate currently in 100-110's, worsened this morning. Will transfuse as above.  - Continue cardiac monitoring.  ESRD:  - Continue HD TTS on schedule thru AVF per nephrology  Hyperkalemia: Improved with HD - Continue lokelma, HD  Fall:  - Bedrest for now, will eventually plan PT/OT  Thrombocytopenia: Resolved.  T2DM: Well-controlled with HbA1c 6.4% (though ?if this was pre or post transfusion)  PAD with L great toe gangrene: - Continue routine follow up with vascular surgery, Dr. Donzetta Matters, considering amputation.   COPD/asthma: Quiescent.  - Continue home singulair, prn  albuterol  CAD, HLD:  - Continue statin - Hold ASA, not on BB due to hypotension  Depression:  - Continue lexapro, bupropion  Hypothyroidism:  - Continue home synthroid.   Moderate protein-calorie malnutrition.   Subjective: Wants boots off this morning, denies current pain and no recent bleeding that he knows of. Eating ok.   Objective: Vitals:   06/01/22 0945 06/01/22 1115 06/01/22 1200 06/01/22 1300  BP: (!) 100/50 95/65 108/70 104/64  Pulse: (!) 124 (!) 119 (!) 111 (!) 110  Resp: 17 (!) '26 17 19  '$ Temp:   98.8 F (37.1 C)   TempSrc:   Oral   SpO2: 97% 97% 95% 96%  Weight:      Height:       Gen: Chronically ill-appearing 81 y.o. male in no distress Pulm: Nonlabored breathing room air. Clear CV: Irreg mild tachycardia without murmur, rub, or gallop. No JVD, no pitting dependent edema. GI: Abdomen soft, non-tender, non-distended, with normoactive bowel sounds. No flank tenderness to palpation. Ext: Warm, no deformities, AVF w/+B/T Skin: No acute rashes, lesions or ulcers on visualized skin. Boots not removed for exam. Abdomen appears without ecchymosis.  Neuro: Alert and oriented. No focal neurological deficits. Psych: Judgement and insight appear fair. Mood euthymic & affect congruent. Behavior is appropriate.    Data Personally reviewed: CBC: Recent Labs  Lab 05/30/22 1051 05/30/22 1107 05/30/22 2301 05/31/22 0054 06/01/22 0210 06/01/22 0759 06/01/22 1419  WBC 9.1  --   --  12.0* 10.3  --   --   NEUTROABS 7.8*  --   --   --   --   --   --  HGB 7.5*   < > 9.6* 11.4* 8.0* 8.0* 7.9*  HCT 24.7*   < > 29.5* 35.5* 23.9* 24.2* 24.5*  MCV 97.2  --   --  93.9 90.2  --   --   PLT 225  --   --  77* 188  --   --    < > = values in this interval not displayed.   Basic Metabolic Panel: Recent Labs  Lab 05/30/22 1051 05/30/22 1107 05/31/22 0235 05/31/22 1009 06/01/22 0210  NA 136 133* 135 137 133*  K 5.3* 5.2* 5.5* 4.2 4.5  CL 94* 94* 93* 93* 92*  CO2 20*  --   '24 25 25  '$ GLUCOSE 265* 255* 175* 142* 153*  BUN 46* 41* 52* 40* 40*  CREATININE 8.61* 9.00* 9.01* 6.72* 6.74*  CALCIUM 8.7*  --  8.7* 8.8* 8.8*  PHOS  --   --  7.4*  --   --    GFR: Estimated Creatinine Clearance: 10.2 mL/min (A) (by C-G formula based on SCr of 6.74 mg/dL (H)). Liver Function Tests: Recent Labs  Lab 05/30/22 1051 05/31/22 0235  AST 12*  --   ALT 9  --   ALKPHOS 70  --   BILITOT 0.6  --   PROT 4.9*  --   ALBUMIN 2.7* 2.5*   Recent Labs  Lab 05/30/22 1051  LIPASE 18   No results for input(s): "AMMONIA" in the last 168 hours. Coagulation Profile: Recent Labs  Lab 05/30/22 1051  INR 1.4*   Cardiac Enzymes: No results for input(s): "CKTOTAL", "CKMB", "CKMBINDEX", "TROPONINI" in the last 168 hours. BNP (last 3 results) No results for input(s): "PROBNP" in the last 8760 hours. HbA1C: Recent Labs    05/31/22 0235  HGBA1C 6.4*   CBG: Recent Labs  Lab 05/31/22 2315 06/01/22 0506 06/01/22 0733 06/01/22 1202 06/01/22 1557  GLUCAP 151* 156* 137* 160* 138*   Recent Results (from the past 240 hour(s))  Blood Culture (routine x 2)     Status: None (Preliminary result)   Collection Time: 05/30/22 11:00 AM   Specimen: BLOOD  Result Value Ref Range Status   Specimen Description BLOOD SITE NOT SPECIFIED  Final   Special Requests   Final    BOTTLES DRAWN AEROBIC AND ANAEROBIC Blood Culture adequate volume   Culture   Final    NO GROWTH 2 DAYS Performed at [facility], [place] [address], [place], [place] [address]    Report Status PENDING  Incomplete  MRSA Next Gen by PCR, Nasal     Status: None   Collection Time: 05/30/22  1:32 PM   Specimen: Nasal Mucosa; Nasal Swab  Result Value Ref Range Status   MRSA by PCR Next Gen NOT DETECTED NOT DETECTED Final    Comment: (NOTE) The GeneXpert MRSA Assay (FDA approved for NASAL specimens only), is one component of a comprehensive MRSA colonization surveillance program. It is not intended to diagnose  MRSA infection nor to guide or monitor treatment for MRSA infections. Test performance is not FDA approved in patients less than 11 years old. Performed at [facility], [place] [address], [place], [place] [address]   Blood Culture (routine x 2)     Status: None (Preliminary result)   Collection Time: 05/30/22 11:03 PM   Specimen: BLOOD  Result Value Ref Range Status   Specimen Description BLOOD RIGHT ANTECUBITAL  Final   Special Requests   Final    BOTTLES DRAWN AEROBIC AND  ANAEROBIC Blood Culture adequate volume   Culture   Final    NO GROWTH 2 DAYS Performed at [facility], [place] [address] [place], [place] [address]    Report Status PENDING  Incomplete     DG Knee Left Port  Result Date: 05/30/2022 CLINICAL DATA:  Fall EXAM: PORTABLE LEFT KNEE - 1-2 VIEW COMPARISON:  None Available. FINDINGS: No acute fracture or dislocation of the left knee. Plate and screw fixation of the lateral tibial plateau with underlying, chronic, sclerotic fracture. Joint spaces are well preserved. No knee joint effusion. Vascular calcinosis. IMPRESSION: No acute fracture or dislocation of the left knee. Plate and screw fixation of the lateral tibial plateau with underlying, chronic, sclerotic fracture. Electronically Signed   By: Delanna Ahmadi M.D.   On: 05/30/2022 16:40    Family Communication: None at bedside  Disposition: Status is: Inpatient Remains inpatient appropriate because: Symptomatic anemia Planned Discharge Destination:  TBD  Patrecia Pour, MD 06/01/2022 4:04 PM Page by [email or web address]

## 2022-06-01 NOTE — Consult Note (Signed)
Urology Consult Note   Requesting Attending Physician:  Patrecia Pour, MD Service Providing Consult: Urology  Consulting Attending: Dr. Alinda Money   Reason for Consult:  left perinephric hematoma  HPI: Christian Dalton is seen in consultation for reasons noted above at the request of Patrecia Pour, MD for evaluation of left perinephric hematoma.   This is a 81 y.o. male with a past medical history of CAD, COPD, diabetes, ESRD, HRN, hypothyroidism who experienced a fall after dialysis and subsequently developed abdominal pain and confusion. Found to have left sided perinephric hematoma. Initial Hgb of 7.5, hypotensive. Underwent left selective embolization of two renal artery branches 05/30/22. Hgb now stable, hemodynamically stable. No hematuria   Past Medical History: Past Medical History:  Diagnosis Date   Acute cholecystitis 08/17/2018   Anemia    Arthritis    Atrial fibrillation (HCC)    CAD (coronary artery disease)    Cholecystitis    COPD (chronic obstructive pulmonary disease) (HCC)    Depression    Diabetes mellitus without complication (HCC)    ESRD (end stage renal disease) on dialysis (HCC)    HOH (hard of hearing)    Hypertension    Hypothyroidism    OSA (obstructive sleep apnea)    does not wear cpap   Spinal stenosis    Wears dentures    wears top dentures   Wears glasses    Wears hearing aid in both ears     Past Surgical History:  Past Surgical History:  Procedure Laterality Date   ABDOMINAL AORTOGRAM N/A 01/15/2021   Procedure: ABDOMINAL AORTOGRAM;  Surgeon: Serafina Mitchell, MD;  Location: [place] [facility];  Service: Cardiovascular;  Laterality: N/A;   ABDOMINAL AORTOGRAM W/LOWER EXTREMITY Bilateral 12/11/2020   Procedure: ABDOMINAL AORTOGRAM W/LOWER EXTREMITY;  Surgeon: Serafina Mitchell, MD;  Location: [place] [facility];  Service: Cardiovascular;  Laterality: Bilateral;   ABDOMINAL AORTOGRAM W/LOWER EXTREMITY N/A 03/17/2022   Procedure: ABDOMINAL AORTOGRAM  W/LOWER EXTREMITY;  Surgeon: Waynetta Sandy, MD;  Location: [facility];  Service: Cardiovascular;  Laterality: N/A;   BASCILIC VEIN TRANSPOSITION Left 12/02/2019   Procedure: BASCILIC VEIN TRANSPOSITION LEFT FIRST STAGE;  Surgeon: Serafina Mitchell, MD;  Location: [place];  Service: Vascular;  Laterality: Left;   [place] Left 02/10/2020   Procedure: BASCILIC VEIN TRANSPOSITION LEFT SECOND STAGE;  Surgeon: Serafina Mitchell, MD;  Location: [place];  Service: Vascular;  Laterality: Left;   BILIARY STENT PLACEMENT  11/25/2018   Procedure: [place];  Surgeon: [name], MD;  Location: [facility];  Service: Gastroenterology;;   BIOPSY  12/20/2018   Procedure: BIOPSY;  Surgeon: [name], MD;  Location: [facility];  Service: Gastroenterology;;   BIOPSY  08/03/2020   Procedure: BIOPSY;  Surgeon: Otis Brace, MD;  Location: [facility];  Service: Gastroenterology;;  EGD and COLON   CARDIAC CATHETERIZATION     CHOLECYSTECTOMY N/A 11/14/2018   Procedure: LAPAROSCOPIC CHOLECYSTECTOMY WITH INTRAOPERATIVE CHOLANGIOGRAM;  Surgeon: Coralie Keens, MD;  Location: [place];  Service: General;  Laterality: N/A;   COLONOSCOPY W/ BIOPSIES AND POLYPECTOMY     COLONOSCOPY WITH PROPOFOL N/A 08/03/2020   Procedure: COLONOSCOPY WITH PROPOFOL;  Surgeon: Otis Brace, MD;  Location: [facility];  Service: Gastroenterology;  Laterality: N/A;   ERCP N/A 11/25/2018   Procedure: ENDOSCOPIC RETROGRADE CHOLANGIOPANCREATOGRAPHY (ERCP);  Surgeon: [name], MD;  Location: [place];  Service: Gastroenterology;  Laterality: N/A;   ESOPHAGOGASTRODUODENOSCOPY N/A 12/20/2018  Procedure: ESOPHAGOGASTRODUODENOSCOPY (EGD);  Surgeon: [name], MD;  Location: [facility];  Service: Gastroenterology;  Laterality: N/A;   ESOPHAGOGASTRODUODENOSCOPY (EGD) WITH PROPOFOL N/A 08/03/2020   Procedure: ESOPHAGOGASTRODUODENOSCOPY (EGD) WITH PROPOFOL;  Surgeon: Otis Brace, MD;   Location: [facility];  Service: Gastroenterology;  Laterality: N/A;   EYE SURGERY     cataract B/L   HAND SURGERY Right    IR ANGIOGRAM SELECTIVE EACH ADDITIONAL VESSEL  05/30/2022   IR ANGIOGRAM SELECTIVE EACH ADDITIONAL VESSEL  05/30/2022   IR ANGIOGRAM SELECTIVE EACH ADDITIONAL VESSEL  05/30/2022   IR ANGIOGRAM SELECTIVE EACH ADDITIONAL VESSEL  05/30/2022   IR ANGIOGRAM SELECTIVE EACH ADDITIONAL VESSEL  05/30/2022   IR ANGIOGRAM SELECTIVE EACH ADDITIONAL VESSEL  05/30/2022   IR DIALY SHUNT INTRO NEEDLE/INTRACATH INITIAL W/IMG LEFT Left 08/20/2018   IR EMBO ART  VEN HEMORR LYMPH EXTRAV  INC GUIDE ROADMAPPING  05/30/2022   IR PERC CHOLECYSTOSTOMY  08/19/2018   IR RADIOLOGIST EVAL & MGMT  09/29/2018   IR US GUIDE VASC ACCESS RIGHT  05/30/2022   LOWER EXTREMITY ANGIOGRAPHY Left 01/15/2021   Procedure: Lower Extremity Angiography;  Surgeon: Serafina Mitchell, MD;  Location: [place] [facility];  Service: Cardiovascular;  Laterality: Left;   MULTIPLE TOOTH EXTRACTIONS     PERIPHERAL VASCULAR BALLOON ANGIOPLASTY Right 12/11/2020   Procedure: PERIPHERAL VASCULAR BALLOON ANGIOPLASTY;  Surgeon: Serafina Mitchell, MD;  Location: [place] [facility];  Service: Cardiovascular;  Laterality: Right;   PERIPHERAL VASCULAR BALLOON ANGIOPLASTY Left 01/15/2021   Procedure: PERIPHERAL VASCULAR BALLOON ANGIOPLASTY;  Surgeon: Serafina Mitchell, MD;  Location: [place] [facility];  Service: Cardiovascular;  Laterality: Left;  PT   POLYPECTOMY  08/03/2020   Procedure: POLYPECTOMY;  Surgeon: Otis Brace, MD;  Location: [facility];  Service: Gastroenterology;;   RESECTION OF ARTERIOVENOUS FISTULA ANEURYSM Left 10/27/2019   Procedure: Resection Of Left Upper Arm  Arteriovenous Fistula Aneurysm Times Two;  Surgeon: Serafina Mitchell, MD;  Location: [place];  Service: Vascular;  Laterality: Left;   SHOULDER SURGERY Right    SPHINCTEROTOMY  11/25/2018   Procedure: SPHINCTEROTOMY;  Surgeon: [name], MD;  Location: [place];   Service: Gastroenterology;;   Lavell Islam REMOVAL  12/20/2018   Procedure: STENT REMOVAL;  Surgeon: [name], MD;  Location: [facility];  Service: Gastroenterology;;   TIBIA FRACTURE SURGERY      Medication: Current Facility-Administered Medications  Medication Dose Route Frequency Provider Last Rate Last Admin   0.9 %  sodium chloride infusion  10 mL/hr Intravenous Once Smoot, Leary Roca, PA-C   Held at 05/30/22 1717   0.9 %  sodium chloride infusion   Intravenous Continuous Chesley Mires, MD   Held at 05/30/22 1717   Chlorhexidine Gluconate Cloth 2 % PADS 6 each  6 each Topical Daily Chesley Mires, MD   6 each at 05/31/22 1708   Chlorhexidine Gluconate Cloth 2 % PADS 6 each  6 each Topical Q0600 Lynnda Child, PA-C   6 each at 06/01/22 1030   insulin aspart (novoLOG) injection 0-6 Units  0-6 Units Subcutaneous Q4H Chesley Mires, MD   1 Units at 06/01/22 0507   ondansetron (ZOFRAN) injection 4 mg  4 mg Intravenous Q6H PRN Noe Gens L, NP       Oral care mouth rinse  15 mL Mouth Rinse PRN Chesley Mires, MD       phenol (CHLORASEPTIC) mouth spray 1 spray  1 spray Mouth/Throat PRN Icard, Bradley L, DO   1 spray at  05/31/22 1329   sodium zirconium cyclosilicate (LOKELMA) packet 10 g  10 g Oral Daily Donato Heinz, MD   10 g at 06/01/22 1152    Allergies: Allergies  Allergen Reactions   Avelox [Moxifloxacin Hcl] Other (See Comments)    Hallucinations    Gabapentin Other (See Comments)    Caused staggering and red feet   Metoprolol Other (See Comments)    Dropped pulse too low; was taken off of this by MD   Quinolones Other (See Comments)    unknown   Shellfish Allergy Nausea And Vomiting and Other (See Comments)    Made patient VERY nauseous and he developed stomach cramps   Sulfa Antibiotics Other (See Comments)    Allergy is from childhood (reaction not recalled)   Tetracyclines & Related Other (See Comments)    Blisters on hands and arms   Adhesive [Tape] Itching and Rash     Paper tape only- skin cannot tolerate "heavy" tapes    Social History: Social History   Tobacco Use   Smoking status: Never   Smokeless tobacco: Former    Types: Nurse, children's Use: Never used  Substance Use Topics   Alcohol use: Not Currently   Drug use: No    Family History Family History  Problem Relation Age of Onset   Heart disease Mother    Heart disease Father    Heart disease Sister     Review of Systems 10 systems were reviewed and are negative except as noted specifically in the HPI.  Objective   Vital signs in last 24 hours: BP 95/65   Pulse (!) 119   Temp 99.6 F (37.6 C) (Oral)   Resp (!) 26   Ht '5\' 10"'  (1.778 m)   Wt 94.2 kg   SpO2 97%   BMI 29.80 kg/m   Physical Exam General: NAD, A&O, resting, appropriate HEENT: [place]/AT, EOMI, MMM Pulmonary: Normal work of breathing Cardiovascular: HDS, adequate peripheral perfusion Abdomen: Soft, NTTP. GU: Voiding spontaneously, No CVA tenderness Extremities: warm and well perfused Neuro: Appropriate, no focal neurological deficits  Most Recent Labs: Lab Results  Component Value Date   WBC 10.3 06/01/2022   HGB 8.0 (L) 06/01/2022   HCT 24.2 (L) 06/01/2022   PLT 188 06/01/2022    Lab Results  Component Value Date   NA 133 (L) 06/01/2022   K 4.5 06/01/2022   CL 92 (L) 06/01/2022   CO2 25 06/01/2022   BUN 40 (H) 06/01/2022   CREATININE 6.74 (H) 06/01/2022   CALCIUM 8.8 (L) 06/01/2022   MG 2.2 11/24/2018   PHOS 7.4 (H) 05/31/2022    Lab Results  Component Value Date   INR 1.4 (H) 05/30/2022   APTT 36 05/30/2022     Urine Culture: '@[email or web address]' (laburin,org,[id number])@   IMAGING: IR US Guide Vasc Access Right  Result Date: 05/31/2022 INDICATION: History of end-stage renal disease with fall earlier today, now with left-sided perisplenic hemorrhage and hypotension. Patient presents today for left-sided renal arteriogram and potential percutaneous embolization. EXAM: 1.  ULTRASOUND GUIDANCE FOR ARTERIAL ACCESS 2. SELECTIVE LEFT RENAL ARTERIOGRAM 3. SUB SELECTIVE ARTERIOGRAM OF THE ANTERIOR DIVISION OF THE LEFT RENAL ARTERY 4. SUB SELECTIVE PERCUTANEOUS COIL EMBOLIZATION OF TWO SEPARATE TERTIARY ARTERIES OF THE ANTERIOR DIVISION OF THE LEFT RENAL ARTERY. 5. SUB SELECTIVE ARTERIOGRAM OF THE POSTERIOR DIVISION OF THE LEFT RENAL ARTERY COMPARISON:  CT abdomen and pelvis-earlier same day MEDICATIONS: None ANESTHESIA/SEDATION: Moderate (conscious) sedation was employed during this procedure. A  total of Versed 1 mg and Fentanyl 100 mcg was administered intravenously. Moderate Sedation Time: 58 minutes. The patient's level of consciousness and vital signs were monitored continuously by radiology nursing throughout the procedure under my direct supervision. CONTRAST:  80 cc Omnipaque 300 FLUOROSCOPY TIME:  11 minutes, 12 seconds (7,829 mGy) COMPLICATIONS: None immediate. PROCEDURE: Informed consent was obtained from the patient and the patient's wife following explanation of the procedure, risks, benefits and alternatives. All questions were addressed. A time out was performed prior to the initiation of the procedure. Maximal barrier sterile technique utilized including caps, mask, sterile gowns, sterile gloves, large sterile drape, hand hygiene, and Betadine prep. The right femoral head was marked fluoroscopically. Under sterile conditions and local anesthesia, the right common femoral artery access was performed with a micropuncture needle. Under direct ultrasound guidance, the right common femoral was accessed with a micropuncture kit. An ultrasound image was saved for documentation purposes. This allowed for placement of a 5-French vascular sheath. A limited arteriogram was performed through the side arm of the sheath confirming appropriate access within the right common femoral artery. Over a Bentson wire, a Mickelson catheter was advanced the caudal aspect of the thoracic aorta where  was reformed, back bled and flushed. The Mickelson catheter was then utilized to select the left renal artery and a selective left renal arteriogram was performed. Next, with the use of a fathom 14 microwire, a cantata microcatheter was utilized to select the anterior division of the left renal artery and a selective arteriogram was performed. Microcatheter was then utilized to select a tertiary branch of the anterior division of the left renal artery supplying an ill-defined area of active extravasation involving the inferior pole of the left kidney. The vessel was then percutaneously coil embolized with overlapping 2 mm and 3 mm interlock coils. Microcatheter was then utilized to select a separate tertiary branch of the anterior division of the left renal artery supplying an additional area of ill-defined active extravasation involving the inferior pole the left kidney. The vessel was then percutaneously coil embolized with overlapping 2 mm and 3 mm interlock coils. Microcatheter was retracted to the more proximal aspect of the anterior division of the left renal artery and a selective arteriogram was performed The microcatheter was then utilized to select the posterior division of the left renal artery and a selective arteriogram was performed The microcatheter was removed and a completion arteriogram was performed via the use of the Mickelson catheter. All images were reviewed and the procedure was terminated. All wires, catheters and sheaths were removed from the patient. Hemostasis was achieved at the right groin access site with manual compression. A dressing was applied. The patient tolerated the procedure well without immediate postprocedural complication. FINDINGS: Selective left renal arteriogram demonstrates markedly sluggish flow through the left renal artery likely attributable to a combination of the patient's known end-stage renal disease as well as large left-sided perinephric hematoma. Sub  selective injection of the anterior division of the left renal artery demonstrates 2 ill-defined areas of active extravasation involving the inferior pole of the left kidney, findings compatible with preceding CT. Both tertiary branches of the anterior division of the left renal artery were successfully catheterized and percutaneously coil embolized. Post embolization arteriogram was negative for areas of residual extravasation. Selective injection of the posterior division of the left renal artery and completion main left renal arteriogram were negative for additional areas of vessel irregularity or contrast extravasation. IMPRESSION: Successful percutaneous coil embolization of 2 tertiary  branches of the anterior division of the left renal artery supplying 2 ill-defined areas of contrast extravasation involving the inferior pole of the left kidney. PLAN: - The patient is to remain flat for 4 hours with right leg straight. - Continued aggressive resuscitation at the discretion of the critical care service. I would not be surprised if the patient's hematocrit level has not yet reached a nadir, however I am hopeful it will stabilize in the coming days. - Note, ultimately if additional percutaneous intervention is warranted, it would likely require a more global embolization placing the patient at risk of renal necrosis. Electronically Signed   By: Sandi Mariscal M.D.   On: 05/31/2022 11:58   IR EMBO ART  VEN HEMORR LYMPH EXTRAV  INC GUIDE ROADMAPPING  Result Date: 05/31/2022 INDICATION: History of end-stage renal disease with fall earlier today, now with left-sided perisplenic hemorrhage and hypotension. Patient presents today for left-sided renal arteriogram and potential percutaneous embolization. EXAM: 1. ULTRASOUND GUIDANCE FOR ARTERIAL ACCESS 2. SELECTIVE LEFT RENAL ARTERIOGRAM 3. SUB SELECTIVE ARTERIOGRAM OF THE ANTERIOR DIVISION OF THE LEFT RENAL ARTERY 4. SUB SELECTIVE PERCUTANEOUS COIL EMBOLIZATION OF TWO  SEPARATE TERTIARY ARTERIES OF THE ANTERIOR DIVISION OF THE LEFT RENAL ARTERY. 5. SUB SELECTIVE ARTERIOGRAM OF THE POSTERIOR DIVISION OF THE LEFT RENAL ARTERY COMPARISON:  CT abdomen and pelvis-earlier same day MEDICATIONS: None ANESTHESIA/SEDATION: Moderate (conscious) sedation was employed during this procedure. A total of Versed 1 mg and Fentanyl 100 mcg was administered intravenously. Moderate Sedation Time: 58 minutes. The patient's level of consciousness and vital signs were monitored continuously by radiology nursing throughout the procedure under my direct supervision. CONTRAST:  80 cc Omnipaque 300 FLUOROSCOPY TIME:  11 minutes, 12 seconds (7,619 mGy) COMPLICATIONS: None immediate. PROCEDURE: Informed consent was obtained from the patient and the patient's wife following explanation of the procedure, risks, benefits and alternatives. All questions were addressed. A time out was performed prior to the initiation of the procedure. Maximal barrier sterile technique utilized including caps, mask, sterile gowns, sterile gloves, large sterile drape, hand hygiene, and Betadine prep. The right femoral head was marked fluoroscopically. Under sterile conditions and local anesthesia, the right common femoral artery access was performed with a micropuncture needle. Under direct ultrasound guidance, the right common femoral was accessed with a micropuncture kit. An ultrasound image was saved for documentation purposes. This allowed for placement of a 5-French vascular sheath. A limited arteriogram was performed through the side arm of the sheath confirming appropriate access within the right common femoral artery. Over a Bentson wire, a Mickelson catheter was advanced the caudal aspect of the thoracic aorta where was reformed, back bled and flushed. The Mickelson catheter was then utilized to select the left renal artery and a selective left renal arteriogram was performed. Next, with the use of a fathom 14 microwire, a  cantata microcatheter was utilized to select the anterior division of the left renal artery and a selective arteriogram was performed. Microcatheter was then utilized to select a tertiary branch of the anterior division of the left renal artery supplying an ill-defined area of active extravasation involving the inferior pole of the left kidney. The vessel was then percutaneously coil embolized with overlapping 2 mm and 3 mm interlock coils. Microcatheter was then utilized to select a separate tertiary branch of the anterior division of the left renal artery supplying an additional area of ill-defined active extravasation involving the inferior pole the left kidney. The vessel was then percutaneously coil embolized with overlapping 2  mm and 3 mm interlock coils. Microcatheter was retracted to the more proximal aspect of the anterior division of the left renal artery and a selective arteriogram was performed The microcatheter was then utilized to select the posterior division of the left renal artery and a selective arteriogram was performed The microcatheter was removed and a completion arteriogram was performed via the use of the Mickelson catheter. All images were reviewed and the procedure was terminated. All wires, catheters and sheaths were removed from the patient. Hemostasis was achieved at the right groin access site with manual compression. A dressing was applied. The patient tolerated the procedure well without immediate postprocedural complication. FINDINGS: Selective left renal arteriogram demonstrates markedly sluggish flow through the left renal artery likely attributable to a combination of the patient's known end-stage renal disease as well as large left-sided perinephric hematoma. Sub selective injection of the anterior division of the left renal artery demonstrates 2 ill-defined areas of active extravasation involving the inferior pole of the left kidney, findings compatible with preceding CT. Both  tertiary branches of the anterior division of the left renal artery were successfully catheterized and percutaneously coil embolized. Post embolization arteriogram was negative for areas of residual extravasation. Selective injection of the posterior division of the left renal artery and completion main left renal arteriogram were negative for additional areas of vessel irregularity or contrast extravasation. IMPRESSION: Successful percutaneous coil embolization of 2 tertiary branches of the anterior division of the left renal artery supplying 2 ill-defined areas of contrast extravasation involving the inferior pole of the left kidney. PLAN: - The patient is to remain flat for 4 hours with right leg straight. - Continued aggressive resuscitation at the discretion of the critical care service. I would not be surprised if the patient's hematocrit level has not yet reached a nadir, however I am hopeful it will stabilize in the coming days. - Note, ultimately if additional percutaneous intervention is warranted, it would likely require a more global embolization placing the patient at risk of renal necrosis. Electronically Signed   By: Sandi Mariscal M.D.   On: 05/31/2022 11:58   IR Angiogram Selective Each Additional Vessel  Result Date: 05/31/2022 INDICATION: History of end-stage renal disease with fall earlier today, now with left-sided perisplenic hemorrhage and hypotension. Patient presents today for left-sided renal arteriogram and potential percutaneous embolization. EXAM: 1. ULTRASOUND GUIDANCE FOR ARTERIAL ACCESS 2. SELECTIVE LEFT RENAL ARTERIOGRAM 3. SUB SELECTIVE ARTERIOGRAM OF THE ANTERIOR DIVISION OF THE LEFT RENAL ARTERY 4. SUB SELECTIVE PERCUTANEOUS COIL EMBOLIZATION OF TWO SEPARATE TERTIARY ARTERIES OF THE ANTERIOR DIVISION OF THE LEFT RENAL ARTERY. 5. SUB SELECTIVE ARTERIOGRAM OF THE POSTERIOR DIVISION OF THE LEFT RENAL ARTERY COMPARISON:  CT abdomen and pelvis-earlier same day MEDICATIONS: None  ANESTHESIA/SEDATION: Moderate (conscious) sedation was employed during this procedure. A total of Versed 1 mg and Fentanyl 100 mcg was administered intravenously. Moderate Sedation Time: 58 minutes. The patient's level of consciousness and vital signs were monitored continuously by radiology nursing throughout the procedure under my direct supervision. CONTRAST:  80 cc Omnipaque 300 FLUOROSCOPY TIME:  11 minutes, 12 seconds (3,790 mGy) COMPLICATIONS: None immediate. PROCEDURE: Informed consent was obtained from the patient and the patient's wife following explanation of the procedure, risks, benefits and alternatives. All questions were addressed. A time out was performed prior to the initiation of the procedure. Maximal barrier sterile technique utilized including caps, mask, sterile gowns, sterile gloves, large sterile drape, hand hygiene, and Betadine prep. The right femoral head was marked fluoroscopically.  Under sterile conditions and local anesthesia, the right common femoral artery access was performed with a micropuncture needle. Under direct ultrasound guidance, the right common femoral was accessed with a micropuncture kit. An ultrasound image was saved for documentation purposes. This allowed for placement of a 5-French vascular sheath. A limited arteriogram was performed through the side arm of the sheath confirming appropriate access within the right common femoral artery. Over a Bentson wire, a Mickelson catheter was advanced the caudal aspect of the thoracic aorta where was reformed, back bled and flushed. The Mickelson catheter was then utilized to select the left renal artery and a selective left renal arteriogram was performed. Next, with the use of a fathom 14 microwire, a cantata microcatheter was utilized to select the anterior division of the left renal artery and a selective arteriogram was performed. Microcatheter was then utilized to select a tertiary branch of the anterior division of the  left renal artery supplying an ill-defined area of active extravasation involving the inferior pole of the left kidney. The vessel was then percutaneously coil embolized with overlapping 2 mm and 3 mm interlock coils. Microcatheter was then utilized to select a separate tertiary branch of the anterior division of the left renal artery supplying an additional area of ill-defined active extravasation involving the inferior pole the left kidney. The vessel was then percutaneously coil embolized with overlapping 2 mm and 3 mm interlock coils. Microcatheter was retracted to the more proximal aspect of the anterior division of the left renal artery and a selective arteriogram was performed The microcatheter was then utilized to select the posterior division of the left renal artery and a selective arteriogram was performed The microcatheter was removed and a completion arteriogram was performed via the use of the Mickelson catheter. All images were reviewed and the procedure was terminated. All wires, catheters and sheaths were removed from the patient. Hemostasis was achieved at the right groin access site with manual compression. A dressing was applied. The patient tolerated the procedure well without immediate postprocedural complication. FINDINGS: Selective left renal arteriogram demonstrates markedly sluggish flow through the left renal artery likely attributable to a combination of the patient's known end-stage renal disease as well as large left-sided perinephric hematoma. Sub selective injection of the anterior division of the left renal artery demonstrates 2 ill-defined areas of active extravasation involving the inferior pole of the left kidney, findings compatible with preceding CT. Both tertiary branches of the anterior division of the left renal artery were successfully catheterized and percutaneously coil embolized. Post embolization arteriogram was negative for areas of residual extravasation. Selective  injection of the posterior division of the left renal artery and completion main left renal arteriogram were negative for additional areas of vessel irregularity or contrast extravasation. IMPRESSION: Successful percutaneous coil embolization of 2 tertiary branches of the anterior division of the left renal artery supplying 2 ill-defined areas of contrast extravasation involving the inferior pole of the left kidney. PLAN: - The patient is to remain flat for 4 hours with right leg straight. - Continued aggressive resuscitation at the discretion of the critical care service. I would not be surprised if the patient's hematocrit level has not yet reached a nadir, however I am hopeful it will stabilize in the coming days. - Note, ultimately if additional percutaneous intervention is warranted, it would likely require a more global embolization placing the patient at risk of renal necrosis. Electronically Signed   By: Sandi Mariscal M.D.   On: 05/31/2022 11:58  IR Angiogram Selective Each Additional Vessel  Result Date: 05/31/2022 INDICATION: History of end-stage renal disease with fall earlier today, now with left-sided perisplenic hemorrhage and hypotension. Patient presents today for left-sided renal arteriogram and potential percutaneous embolization. EXAM: 1. ULTRASOUND GUIDANCE FOR ARTERIAL ACCESS 2. SELECTIVE LEFT RENAL ARTERIOGRAM 3. SUB SELECTIVE ARTERIOGRAM OF THE ANTERIOR DIVISION OF THE LEFT RENAL ARTERY 4. SUB SELECTIVE PERCUTANEOUS COIL EMBOLIZATION OF TWO SEPARATE TERTIARY ARTERIES OF THE ANTERIOR DIVISION OF THE LEFT RENAL ARTERY. 5. SUB SELECTIVE ARTERIOGRAM OF THE POSTERIOR DIVISION OF THE LEFT RENAL ARTERY COMPARISON:  CT abdomen and pelvis-earlier same day MEDICATIONS: None ANESTHESIA/SEDATION: Moderate (conscious) sedation was employed during this procedure. A total of Versed 1 mg and Fentanyl 100 mcg was administered intravenously. Moderate Sedation Time: 58 minutes. The patient's level of  consciousness and vital signs were monitored continuously by radiology nursing throughout the procedure under my direct supervision. CONTRAST:  80 cc Omnipaque 300 FLUOROSCOPY TIME:  11 minutes, 12 seconds (9,147 mGy) COMPLICATIONS: None immediate. PROCEDURE: Informed consent was obtained from the patient and the patient's wife following explanation of the procedure, risks, benefits and alternatives. All questions were addressed. A time out was performed prior to the initiation of the procedure. Maximal barrier sterile technique utilized including caps, mask, sterile gowns, sterile gloves, large sterile drape, hand hygiene, and Betadine prep. The right femoral head was marked fluoroscopically. Under sterile conditions and local anesthesia, the right common femoral artery access was performed with a micropuncture needle. Under direct ultrasound guidance, the right common femoral was accessed with a micropuncture kit. An ultrasound image was saved for documentation purposes. This allowed for placement of a 5-French vascular sheath. A limited arteriogram was performed through the side arm of the sheath confirming appropriate access within the right common femoral artery. Over a Bentson wire, a Mickelson catheter was advanced the caudal aspect of the thoracic aorta where was reformed, back bled and flushed. The Mickelson catheter was then utilized to select the left renal artery and a selective left renal arteriogram was performed. Next, with the use of a fathom 14 microwire, a cantata microcatheter was utilized to select the anterior division of the left renal artery and a selective arteriogram was performed. Microcatheter was then utilized to select a tertiary branch of the anterior division of the left renal artery supplying an ill-defined area of active extravasation involving the inferior pole of the left kidney. The vessel was then percutaneously coil embolized with overlapping 2 mm and 3 mm interlock coils.  Microcatheter was then utilized to select a separate tertiary branch of the anterior division of the left renal artery supplying an additional area of ill-defined active extravasation involving the inferior pole the left kidney. The vessel was then percutaneously coil embolized with overlapping 2 mm and 3 mm interlock coils. Microcatheter was retracted to the more proximal aspect of the anterior division of the left renal artery and a selective arteriogram was performed The microcatheter was then utilized to select the posterior division of the left renal artery and a selective arteriogram was performed The microcatheter was removed and a completion arteriogram was performed via the use of the Mickelson catheter. All images were reviewed and the procedure was terminated. All wires, catheters and sheaths were removed from the patient. Hemostasis was achieved at the right groin access site with manual compression. A dressing was applied. The patient tolerated the procedure well without immediate postprocedural complication. FINDINGS: Selective left renal arteriogram demonstrates markedly sluggish flow through the left renal artery  likely attributable to a combination of the patient's known end-stage renal disease as well as large left-sided perinephric hematoma. Sub selective injection of the anterior division of the left renal artery demonstrates 2 ill-defined areas of active extravasation involving the inferior pole of the left kidney, findings compatible with preceding CT. Both tertiary branches of the anterior division of the left renal artery were successfully catheterized and percutaneously coil embolized. Post embolization arteriogram was negative for areas of residual extravasation. Selective injection of the posterior division of the left renal artery and completion main left renal arteriogram were negative for additional areas of vessel irregularity or contrast extravasation. IMPRESSION: Successful  percutaneous coil embolization of 2 tertiary branches of the anterior division of the left renal artery supplying 2 ill-defined areas of contrast extravasation involving the inferior pole of the left kidney. PLAN: - The patient is to remain flat for 4 hours with right leg straight. - Continued aggressive resuscitation at the discretion of the critical care service. I would not be surprised if the patient's hematocrit level has not yet reached a nadir, however I am hopeful it will stabilize in the coming days. - Note, ultimately if additional percutaneous intervention is warranted, it would likely require a more global embolization placing the patient at risk of renal necrosis. Electronically Signed   By: Sandi Mariscal M.D.   On: 05/31/2022 11:58   IR Angiogram Selective Each Additional Vessel  Result Date: 05/31/2022 INDICATION: History of end-stage renal disease with fall earlier today, now with left-sided perisplenic hemorrhage and hypotension. Patient presents today for left-sided renal arteriogram and potential percutaneous embolization. EXAM: 1. ULTRASOUND GUIDANCE FOR ARTERIAL ACCESS 2. SELECTIVE LEFT RENAL ARTERIOGRAM 3. SUB SELECTIVE ARTERIOGRAM OF THE ANTERIOR DIVISION OF THE LEFT RENAL ARTERY 4. SUB SELECTIVE PERCUTANEOUS COIL EMBOLIZATION OF TWO SEPARATE TERTIARY ARTERIES OF THE ANTERIOR DIVISION OF THE LEFT RENAL ARTERY. 5. SUB SELECTIVE ARTERIOGRAM OF THE POSTERIOR DIVISION OF THE LEFT RENAL ARTERY COMPARISON:  CT abdomen and pelvis-earlier same day MEDICATIONS: None ANESTHESIA/SEDATION: Moderate (conscious) sedation was employed during this procedure. A total of Versed 1 mg and Fentanyl 100 mcg was administered intravenously. Moderate Sedation Time: 58 minutes. The patient's level of consciousness and vital signs were monitored continuously by radiology nursing throughout the procedure under my direct supervision. CONTRAST:  80 cc Omnipaque 300 FLUOROSCOPY TIME:  11 minutes, 12 seconds (8,182 mGy)  COMPLICATIONS: None immediate. PROCEDURE: Informed consent was obtained from the patient and the patient's wife following explanation of the procedure, risks, benefits and alternatives. All questions were addressed. A time out was performed prior to the initiation of the procedure. Maximal barrier sterile technique utilized including caps, mask, sterile gowns, sterile gloves, large sterile drape, hand hygiene, and Betadine prep. The right femoral head was marked fluoroscopically. Under sterile conditions and local anesthesia, the right common femoral artery access was performed with a micropuncture needle. Under direct ultrasound guidance, the right common femoral was accessed with a micropuncture kit. An ultrasound image was saved for documentation purposes. This allowed for placement of a 5-French vascular sheath. A limited arteriogram was performed through the side arm of the sheath confirming appropriate access within the right common femoral artery. Over a Bentson wire, a Mickelson catheter was advanced the caudal aspect of the thoracic aorta where was reformed, back bled and flushed. The Mickelson catheter was then utilized to select the left renal artery and a selective left renal arteriogram was performed. Next, with the use of a fathom 14 microwire, a cantata microcatheter was  utilized to select the anterior division of the left renal artery and a selective arteriogram was performed. Microcatheter was then utilized to select a tertiary branch of the anterior division of the left renal artery supplying an ill-defined area of active extravasation involving the inferior pole of the left kidney. The vessel was then percutaneously coil embolized with overlapping 2 mm and 3 mm interlock coils. Microcatheter was then utilized to select a separate tertiary branch of the anterior division of the left renal artery supplying an additional area of ill-defined active extravasation involving the inferior pole the left  kidney. The vessel was then percutaneously coil embolized with overlapping 2 mm and 3 mm interlock coils. Microcatheter was retracted to the more proximal aspect of the anterior division of the left renal artery and a selective arteriogram was performed The microcatheter was then utilized to select the posterior division of the left renal artery and a selective arteriogram was performed The microcatheter was removed and a completion arteriogram was performed via the use of the Mickelson catheter. All images were reviewed and the procedure was terminated. All wires, catheters and sheaths were removed from the patient. Hemostasis was achieved at the right groin access site with manual compression. A dressing was applied. The patient tolerated the procedure well without immediate postprocedural complication. FINDINGS: Selective left renal arteriogram demonstrates markedly sluggish flow through the left renal artery likely attributable to a combination of the patient's known end-stage renal disease as well as large left-sided perinephric hematoma. Sub selective injection of the anterior division of the left renal artery demonstrates 2 ill-defined areas of active extravasation involving the inferior pole of the left kidney, findings compatible with preceding CT. Both tertiary branches of the anterior division of the left renal artery were successfully catheterized and percutaneously coil embolized. Post embolization arteriogram was negative for areas of residual extravasation. Selective injection of the posterior division of the left renal artery and completion main left renal arteriogram were negative for additional areas of vessel irregularity or contrast extravasation. IMPRESSION: Successful percutaneous coil embolization of 2 tertiary branches of the anterior division of the left renal artery supplying 2 ill-defined areas of contrast extravasation involving the inferior pole of the left kidney. PLAN: - The patient is  to remain flat for 4 hours with right leg straight. - Continued aggressive resuscitation at the discretion of the critical care service. I would not be surprised if the patient's hematocrit level has not yet reached a nadir, however I am hopeful it will stabilize in the coming days. - Note, ultimately if additional percutaneous intervention is warranted, it would likely require a more global embolization placing the patient at risk of renal necrosis. Electronically Signed   By: Sandi Mariscal M.D.   On: 05/31/2022 11:58   IR Angiogram Selective Each Additional Vessel  Result Date: 05/31/2022 INDICATION: History of end-stage renal disease with fall earlier today, now with left-sided perisplenic hemorrhage and hypotension. Patient presents today for left-sided renal arteriogram and potential percutaneous embolization. EXAM: 1. ULTRASOUND GUIDANCE FOR ARTERIAL ACCESS 2. SELECTIVE LEFT RENAL ARTERIOGRAM 3. SUB SELECTIVE ARTERIOGRAM OF THE ANTERIOR DIVISION OF THE LEFT RENAL ARTERY 4. SUB SELECTIVE PERCUTANEOUS COIL EMBOLIZATION OF TWO SEPARATE TERTIARY ARTERIES OF THE ANTERIOR DIVISION OF THE LEFT RENAL ARTERY. 5. SUB SELECTIVE ARTERIOGRAM OF THE POSTERIOR DIVISION OF THE LEFT RENAL ARTERY COMPARISON:  CT abdomen and pelvis-earlier same day MEDICATIONS: None ANESTHESIA/SEDATION: Moderate (conscious) sedation was employed during this procedure. A total of Versed 1 mg and Fentanyl 100 mcg was  administered intravenously. Moderate Sedation Time: 58 minutes. The patient's level of consciousness and vital signs were monitored continuously by radiology nursing throughout the procedure under my direct supervision. CONTRAST:  80 cc Omnipaque 300 FLUOROSCOPY TIME:  11 minutes, 12 seconds (1,610 mGy) COMPLICATIONS: None immediate. PROCEDURE: Informed consent was obtained from the patient and the patient's wife following explanation of the procedure, risks, benefits and alternatives. All questions were addressed. A time out was  performed prior to the initiation of the procedure. Maximal barrier sterile technique utilized including caps, mask, sterile gowns, sterile gloves, large sterile drape, hand hygiene, and Betadine prep. The right femoral head was marked fluoroscopically. Under sterile conditions and local anesthesia, the right common femoral artery access was performed with a micropuncture needle. Under direct ultrasound guidance, the right common femoral was accessed with a micropuncture kit. An ultrasound image was saved for documentation purposes. This allowed for placement of a 5-French vascular sheath. A limited arteriogram was performed through the side arm of the sheath confirming appropriate access within the right common femoral artery. Over a Bentson wire, a Mickelson catheter was advanced the caudal aspect of the thoracic aorta where was reformed, back bled and flushed. The Mickelson catheter was then utilized to select the left renal artery and a selective left renal arteriogram was performed. Next, with the use of a fathom 14 microwire, a cantata microcatheter was utilized to select the anterior division of the left renal artery and a selective arteriogram was performed. Microcatheter was then utilized to select a tertiary branch of the anterior division of the left renal artery supplying an ill-defined area of active extravasation involving the inferior pole of the left kidney. The vessel was then percutaneously coil embolized with overlapping 2 mm and 3 mm interlock coils. Microcatheter was then utilized to select a separate tertiary branch of the anterior division of the left renal artery supplying an additional area of ill-defined active extravasation involving the inferior pole the left kidney. The vessel was then percutaneously coil embolized with overlapping 2 mm and 3 mm interlock coils. Microcatheter was retracted to the more proximal aspect of the anterior division of the left renal artery and a selective  arteriogram was performed The microcatheter was then utilized to select the posterior division of the left renal artery and a selective arteriogram was performed The microcatheter was removed and a completion arteriogram was performed via the use of the Mickelson catheter. All images were reviewed and the procedure was terminated. All wires, catheters and sheaths were removed from the patient. Hemostasis was achieved at the right groin access site with manual compression. A dressing was applied. The patient tolerated the procedure well without immediate postprocedural complication. FINDINGS: Selective left renal arteriogram demonstrates markedly sluggish flow through the left renal artery likely attributable to a combination of the patient's known end-stage renal disease as well as large left-sided perinephric hematoma. Sub selective injection of the anterior division of the left renal artery demonstrates 2 ill-defined areas of active extravasation involving the inferior pole of the left kidney, findings compatible with preceding CT. Both tertiary branches of the anterior division of the left renal artery were successfully catheterized and percutaneously coil embolized. Post embolization arteriogram was negative for areas of residual extravasation. Selective injection of the posterior division of the left renal artery and completion main left renal arteriogram were negative for additional areas of vessel irregularity or contrast extravasation. IMPRESSION: Successful percutaneous coil embolization of 2 tertiary branches of the anterior division of the left renal artery  supplying 2 ill-defined areas of contrast extravasation involving the inferior pole of the left kidney. PLAN: - The patient is to remain flat for 4 hours with right leg straight. - Continued aggressive resuscitation at the discretion of the critical care service. I would not be surprised if the patient's hematocrit level has not yet reached a nadir,  however I am hopeful it will stabilize in the coming days. - Note, ultimately if additional percutaneous intervention is warranted, it would likely require a more global embolization placing the patient at risk of renal necrosis. Electronically Signed   By: Sandi Mariscal M.D.   On: 05/31/2022 11:58   IR Angiogram Selective Each Additional Vessel  Result Date: 05/31/2022 INDICATION: History of end-stage renal disease with fall earlier today, now with left-sided perisplenic hemorrhage and hypotension. Patient presents today for left-sided renal arteriogram and potential percutaneous embolization. EXAM: 1. ULTRASOUND GUIDANCE FOR ARTERIAL ACCESS 2. SELECTIVE LEFT RENAL ARTERIOGRAM 3. SUB SELECTIVE ARTERIOGRAM OF THE ANTERIOR DIVISION OF THE LEFT RENAL ARTERY 4. SUB SELECTIVE PERCUTANEOUS COIL EMBOLIZATION OF TWO SEPARATE TERTIARY ARTERIES OF THE ANTERIOR DIVISION OF THE LEFT RENAL ARTERY. 5. SUB SELECTIVE ARTERIOGRAM OF THE POSTERIOR DIVISION OF THE LEFT RENAL ARTERY COMPARISON:  CT abdomen and pelvis-earlier same day MEDICATIONS: None ANESTHESIA/SEDATION: Moderate (conscious) sedation was employed during this procedure. A total of Versed 1 mg and Fentanyl 100 mcg was administered intravenously. Moderate Sedation Time: 58 minutes. The patient's level of consciousness and vital signs were monitored continuously by radiology nursing throughout the procedure under my direct supervision. CONTRAST:  80 cc Omnipaque 300 FLUOROSCOPY TIME:  11 minutes, 12 seconds (9,379 mGy) COMPLICATIONS: None immediate. PROCEDURE: Informed consent was obtained from the patient and the patient's wife following explanation of the procedure, risks, benefits and alternatives. All questions were addressed. A time out was performed prior to the initiation of the procedure. Maximal barrier sterile technique utilized including caps, mask, sterile gowns, sterile gloves, large sterile drape, hand hygiene, and Betadine prep. The right femoral head was  marked fluoroscopically. Under sterile conditions and local anesthesia, the right common femoral artery access was performed with a micropuncture needle. Under direct ultrasound guidance, the right common femoral was accessed with a micropuncture kit. An ultrasound image was saved for documentation purposes. This allowed for placement of a 5-French vascular sheath. A limited arteriogram was performed through the side arm of the sheath confirming appropriate access within the right common femoral artery. Over a Bentson wire, a Mickelson catheter was advanced the caudal aspect of the thoracic aorta where was reformed, back bled and flushed. The Mickelson catheter was then utilized to select the left renal artery and a selective left renal arteriogram was performed. Next, with the use of a fathom 14 microwire, a cantata microcatheter was utilized to select the anterior division of the left renal artery and a selective arteriogram was performed. Microcatheter was then utilized to select a tertiary branch of the anterior division of the left renal artery supplying an ill-defined area of active extravasation involving the inferior pole of the left kidney. The vessel was then percutaneously coil embolized with overlapping 2 mm and 3 mm interlock coils. Microcatheter was then utilized to select a separate tertiary branch of the anterior division of the left renal artery supplying an additional area of ill-defined active extravasation involving the inferior pole the left kidney. The vessel was then percutaneously coil embolized with overlapping 2 mm and 3 mm interlock coils. Microcatheter was retracted to the more proximal aspect of the  anterior division of the left renal artery and a selective arteriogram was performed The microcatheter was then utilized to select the posterior division of the left renal artery and a selective arteriogram was performed The microcatheter was removed and a completion arteriogram was performed  via the use of the Mickelson catheter. All images were reviewed and the procedure was terminated. All wires, catheters and sheaths were removed from the patient. Hemostasis was achieved at the right groin access site with manual compression. A dressing was applied. The patient tolerated the procedure well without immediate postprocedural complication. FINDINGS: Selective left renal arteriogram demonstrates markedly sluggish flow through the left renal artery likely attributable to a combination of the patient's known end-stage renal disease as well as large left-sided perinephric hematoma. Sub selective injection of the anterior division of the left renal artery demonstrates 2 ill-defined areas of active extravasation involving the inferior pole of the left kidney, findings compatible with preceding CT. Both tertiary branches of the anterior division of the left renal artery were successfully catheterized and percutaneously coil embolized. Post embolization arteriogram was negative for areas of residual extravasation. Selective injection of the posterior division of the left renal artery and completion main left renal arteriogram were negative for additional areas of vessel irregularity or contrast extravasation. IMPRESSION: Successful percutaneous coil embolization of 2 tertiary branches of the anterior division of the left renal artery supplying 2 ill-defined areas of contrast extravasation involving the inferior pole of the left kidney. PLAN: - The patient is to remain flat for 4 hours with right leg straight. - Continued aggressive resuscitation at the discretion of the critical care service. I would not be surprised if the patient's hematocrit level has not yet reached a nadir, however I am hopeful it will stabilize in the coming days. - Note, ultimately if additional percutaneous intervention is warranted, it would likely require a more global embolization placing the patient at risk of renal necrosis.  Electronically Signed   By: Sandi Mariscal M.D.   On: 05/31/2022 11:58   IR Angiogram Selective Each Additional Vessel  Result Date: 05/31/2022 INDICATION: History of end-stage renal disease with fall earlier today, now with left-sided perisplenic hemorrhage and hypotension. Patient presents today for left-sided renal arteriogram and potential percutaneous embolization. EXAM: 1. ULTRASOUND GUIDANCE FOR ARTERIAL ACCESS 2. SELECTIVE LEFT RENAL ARTERIOGRAM 3. SUB SELECTIVE ARTERIOGRAM OF THE ANTERIOR DIVISION OF THE LEFT RENAL ARTERY 4. SUB SELECTIVE PERCUTANEOUS COIL EMBOLIZATION OF TWO SEPARATE TERTIARY ARTERIES OF THE ANTERIOR DIVISION OF THE LEFT RENAL ARTERY. 5. SUB SELECTIVE ARTERIOGRAM OF THE POSTERIOR DIVISION OF THE LEFT RENAL ARTERY COMPARISON:  CT abdomen and pelvis-earlier same day MEDICATIONS: None ANESTHESIA/SEDATION: Moderate (conscious) sedation was employed during this procedure. A total of Versed 1 mg and Fentanyl 100 mcg was administered intravenously. Moderate Sedation Time: 58 minutes. The patient's level of consciousness and vital signs were monitored continuously by radiology nursing throughout the procedure under my direct supervision. CONTRAST:  80 cc Omnipaque 300 FLUOROSCOPY TIME:  11 minutes, 12 seconds (5,631 mGy) COMPLICATIONS: None immediate. PROCEDURE: Informed consent was obtained from the patient and the patient's wife following explanation of the procedure, risks, benefits and alternatives. All questions were addressed. A time out was performed prior to the initiation of the procedure. Maximal barrier sterile technique utilized including caps, mask, sterile gowns, sterile gloves, large sterile drape, hand hygiene, and Betadine prep. The right femoral head was marked fluoroscopically. Under sterile conditions and local anesthesia, the right common femoral artery access was performed with a  micropuncture needle. Under direct ultrasound guidance, the right common femoral was accessed  with a micropuncture kit. An ultrasound image was saved for documentation purposes. This allowed for placement of a 5-French vascular sheath. A limited arteriogram was performed through the side arm of the sheath confirming appropriate access within the right common femoral artery. Over a Bentson wire, a Mickelson catheter was advanced the caudal aspect of the thoracic aorta where was reformed, back bled and flushed. The Mickelson catheter was then utilized to select the left renal artery and a selective left renal arteriogram was performed. Next, with the use of a fathom 14 microwire, a cantata microcatheter was utilized to select the anterior division of the left renal artery and a selective arteriogram was performed. Microcatheter was then utilized to select a tertiary branch of the anterior division of the left renal artery supplying an ill-defined area of active extravasation involving the inferior pole of the left kidney. The vessel was then percutaneously coil embolized with overlapping 2 mm and 3 mm interlock coils. Microcatheter was then utilized to select a separate tertiary branch of the anterior division of the left renal artery supplying an additional area of ill-defined active extravasation involving the inferior pole the left kidney. The vessel was then percutaneously coil embolized with overlapping 2 mm and 3 mm interlock coils. Microcatheter was retracted to the more proximal aspect of the anterior division of the left renal artery and a selective arteriogram was performed The microcatheter was then utilized to select the posterior division of the left renal artery and a selective arteriogram was performed The microcatheter was removed and a completion arteriogram was performed via the use of the Mickelson catheter. All images were reviewed and the procedure was terminated. All wires, catheters and sheaths were removed from the patient. Hemostasis was achieved at the right groin access site with  manual compression. A dressing was applied. The patient tolerated the procedure well without immediate postprocedural complication. FINDINGS: Selective left renal arteriogram demonstrates markedly sluggish flow through the left renal artery likely attributable to a combination of the patient's known end-stage renal disease as well as large left-sided perinephric hematoma. Sub selective injection of the anterior division of the left renal artery demonstrates 2 ill-defined areas of active extravasation involving the inferior pole of the left kidney, findings compatible with preceding CT. Both tertiary branches of the anterior division of the left renal artery were successfully catheterized and percutaneously coil embolized. Post embolization arteriogram was negative for areas of residual extravasation. Selective injection of the posterior division of the left renal artery and completion main left renal arteriogram were negative for additional areas of vessel irregularity or contrast extravasation. IMPRESSION: Successful percutaneous coil embolization of 2 tertiary branches of the anterior division of the left renal artery supplying 2 ill-defined areas of contrast extravasation involving the inferior pole of the left kidney. PLAN: - The patient is to remain flat for 4 hours with right leg straight. - Continued aggressive resuscitation at the discretion of the critical care service. I would not be surprised if the patient's hematocrit level has not yet reached a nadir, however I am hopeful it will stabilize in the coming days. - Note, ultimately if additional percutaneous intervention is warranted, it would likely require a more global embolization placing the patient at risk of renal necrosis. Electronically Signed   By: Sandi Mariscal M.D.   On: 05/31/2022 11:58   DG Knee Left Port  Result Date: 05/30/2022 CLINICAL DATA:  Fall EXAM: PORTABLE LEFT KNEE -  1-2 VIEW COMPARISON:  None Available. FINDINGS: No acute fracture  or dislocation of the left knee. Plate and screw fixation of the lateral tibial plateau with underlying, chronic, sclerotic fracture. Joint spaces are well preserved. No knee joint effusion. Vascular calcinosis. IMPRESSION: No acute fracture or dislocation of the left knee. Plate and screw fixation of the lateral tibial plateau with underlying, chronic, sclerotic fracture. Electronically Signed   By: Delanna Ahmadi M.D.   On: 05/30/2022 16:40    ------  Assessment:  81 y.o. male s/p fall with left perinephric hematoma s/p embolization. Hgb and vital signs stable.   Recommendations: - Continue with serial H&H monitoring - Continue bedrest   Thank you for this consult. Please contact the urology consult pager with any further questions/concerns.

## 2022-06-01 NOTE — Progress Notes (Signed)
Patient ID: Christian Dalton, male   DOB: [birth year], 81 y.o.   MRN: [id number] S: Feels well, has had some A fib with RVR but asymptomatic. O:BP (!) 130/114   Pulse (!) 113   Temp 99.6 F (37.6 C) (Oral)   Resp (!) 26   Ht _0  (1.778 m)   Wt 94.2 kg   SpO2 95%   BMI 29.80 kg/m   Intake/Output Summary (Last 24 hours) at 06/01/2022 0944 Last data filed at 05/31/2022 1230 Gross per 24 hour  Intake 0 ml  Output 250 ml  Net -250 ml   Intake/Output: I/O last 3 completed shifts: In: 325 [I.V.:10; Blood:315] Out: 250 [Other:250]  Intake/Output this shift:  No intake/output data recorded. Weight change: 2.2 kg Gen:NAD CVS: IRR IRR Resp:CTA Abd: +BS, soft, Nt/Nd Ext: trace pretibial edema, gangrenous left great toe, LUE AVF +T/B  Recent Labs  Lab 05/30/22 1051 05/30/22 1107 05/31/22 0235 05/31/22 1009 06/01/22 0210  NA 136 133* 135 137 133*  K 5.3* 5.2* 5.5* 4.2 4.5  CL 94* 94* 93* 93* 92*  CO2 20*  --  _1 GLUCOSE 265* 255* 175* 142* 153*  BUN 46* 41* 52* 40* 40*  CREATININE 8.61* 9.00* 9.01* 6.72* 6.74*  ALBUMIN 2.7*  --  2.5*  --   --   CALCIUM 8.7*  --  8.7* 8.8* 8.8*  PHOS  --   --  7.4*  --   --   AST 12*  --   --   --   --   ALT 9  --   --   --   --    Liver Function Tests: Recent Labs  Lab 05/30/22 1051 05/31/22 0235  AST 12*  --   ALT 9  --   ALKPHOS 70  --   BILITOT 0.6  --   PROT 4.9*  --   ALBUMIN 2.7* 2.5*   Recent Labs  Lab 05/30/22 1051  LIPASE 18   No results for input(s): "AMMONIA" in the last 168 hours. CBC: Recent Labs  Lab 05/30/22 1051 05/30/22 1107 05/31/22 0054 06/01/22 0210 06/01/22 0759  WBC 9.1  --  12.0* 10.3  --   NEUTROABS 7.8*  --   --   --   --   HGB 7.5*   < > 11.4* 8.0* 8.0*  HCT 24.7*   < > 35.5* 23.9* 24.2*  MCV 97.2  --  93.9 90.2  --   PLT 225  --  77* 188  --    < > = values in this interval not displayed.   Cardiac Enzymes: No results for input(s): "CKTOTAL", "CKMB", "CKMBINDEX", "TROPONINI" in the  last 168 hours. CBG: Recent Labs  Lab 05/31/22 1540 05/31/22 1934 05/31/22 2315 06/01/22 0506 06/01/22 0733  GLUCAP 137* 147* 151* 156* 137*    Iron Studies: No results for input(s): "IRON", "TIBC", "TRANSFERRIN", "FERRITIN" in the last 72 hours. Studies/Results: IR US Guide Vasc Access Right  Result Date: 05/31/2022 INDICATION: History of end-stage renal disease with fall earlier today, now with left-sided perisplenic hemorrhage and hypotension. Patient presents today for left-sided renal arteriogram and potential percutaneous embolization. EXAM: 1. ULTRASOUND GUIDANCE FOR ARTERIAL ACCESS 2. SELECTIVE LEFT RENAL ARTERIOGRAM 3. SUB SELECTIVE ARTERIOGRAM OF THE ANTERIOR DIVISION OF THE LEFT RENAL ARTERY 4. SUB SELECTIVE PERCUTANEOUS COIL EMBOLIZATION OF TWO SEPARATE TERTIARY ARTERIES OF THE ANTERIOR DIVISION OF THE LEFT RENAL ARTERY. 5. SUB SELECTIVE ARTERIOGRAM OF THE POSTERIOR DIVISION OF THE LEFT RENAL  ARTERY COMPARISON:  CT abdomen and pelvis-earlier same day MEDICATIONS: None ANESTHESIA/SEDATION: Moderate (conscious) sedation was employed during this procedure. A total of Versed 1 mg and Fentanyl 100 mcg was administered intravenously. Moderate Sedation Time: 58 minutes. The patient's level of consciousness and vital signs were monitored continuously by radiology nursing throughout the procedure under my direct supervision. CONTRAST:  80 cc Omnipaque 300 FLUOROSCOPY TIME:  11 minutes, 12 seconds (6,073 mGy) COMPLICATIONS: None immediate. PROCEDURE: Informed consent was obtained from the patient and the patient's wife following explanation of the procedure, risks, benefits and alternatives. All questions were addressed. A time out was performed prior to the initiation of the procedure. Maximal barrier sterile technique utilized including caps, mask, sterile gowns, sterile gloves, large sterile drape, hand hygiene, and Betadine prep. The right femoral head was marked fluoroscopically. Under sterile  conditions and local anesthesia, the right common femoral artery access was performed with a micropuncture needle. Under direct ultrasound guidance, the right common femoral was accessed with a micropuncture kit. An ultrasound image was saved for documentation purposes. This allowed for placement of a 5-French vascular sheath. A limited arteriogram was performed through the side arm of the sheath confirming appropriate access within the right common femoral artery. Over a Bentson wire, a Mickelson catheter was advanced the caudal aspect of the thoracic aorta where was reformed, back bled and flushed. The Mickelson catheter was then utilized to select the left renal artery and a selective left renal arteriogram was performed. Next, with the use of a fathom 14 microwire, a cantata microcatheter was utilized to select the anterior division of the left renal artery and a selective arteriogram was performed. Microcatheter was then utilized to select a tertiary branch of the anterior division of the left renal artery supplying an ill-defined area of active extravasation involving the inferior pole of the left kidney. The vessel was then percutaneously coil embolized with overlapping 2 mm and 3 mm interlock coils. Microcatheter was then utilized to select a separate tertiary branch of the anterior division of the left renal artery supplying an additional area of ill-defined active extravasation involving the inferior pole the left kidney. The vessel was then percutaneously coil embolized with overlapping 2 mm and 3 mm interlock coils. Microcatheter was retracted to the more proximal aspect of the anterior division of the left renal artery and a selective arteriogram was performed The microcatheter was then utilized to select the posterior division of the left renal artery and a selective arteriogram was performed The microcatheter was removed and a completion arteriogram was performed via the use of the Mickelson catheter.  All images were reviewed and the procedure was terminated. All wires, catheters and sheaths were removed from the patient. Hemostasis was achieved at the right groin access site with manual compression. A dressing was applied. The patient tolerated the procedure well without immediate postprocedural complication. FINDINGS: Selective left renal arteriogram demonstrates markedly sluggish flow through the left renal artery likely attributable to a combination of the patient's known end-stage renal disease as well as large left-sided perinephric hematoma. Sub selective injection of the anterior division of the left renal artery demonstrates 2 ill-defined areas of active extravasation involving the inferior pole of the left kidney, findings compatible with preceding CT. Both tertiary branches of the anterior division of the left renal artery were successfully catheterized and percutaneously coil embolized. Post embolization arteriogram was negative for areas of residual extravasation. Selective injection of the posterior division of the left renal artery and completion main left  renal arteriogram were negative for additional areas of vessel irregularity or contrast extravasation. IMPRESSION: Successful percutaneous coil embolization of 2 tertiary branches of the anterior division of the left renal artery supplying 2 ill-defined areas of contrast extravasation involving the inferior pole of the left kidney. PLAN: - The patient is to remain flat for 4 hours with right leg straight. - Continued aggressive resuscitation at the discretion of the critical care service. I would not be surprised if the patient's hematocrit level has not yet reached a nadir, however I am hopeful it will stabilize in the coming days. - Note, ultimately if additional percutaneous intervention is warranted, it would likely require a more global embolization placing the patient at risk of renal necrosis. Electronically Signed   By: Sandi Mariscal M.D.    On: 05/31/2022 11:58   IR EMBO ART  VEN HEMORR LYMPH EXTRAV  INC GUIDE ROADMAPPING  Result Date: 05/31/2022 INDICATION: History of end-stage renal disease with fall earlier today, now with left-sided perisplenic hemorrhage and hypotension. Patient presents today for left-sided renal arteriogram and potential percutaneous embolization. EXAM: 1. ULTRASOUND GUIDANCE FOR ARTERIAL ACCESS 2. SELECTIVE LEFT RENAL ARTERIOGRAM 3. SUB SELECTIVE ARTERIOGRAM OF THE ANTERIOR DIVISION OF THE LEFT RENAL ARTERY 4. SUB SELECTIVE PERCUTANEOUS COIL EMBOLIZATION OF TWO SEPARATE TERTIARY ARTERIES OF THE ANTERIOR DIVISION OF THE LEFT RENAL ARTERY. 5. SUB SELECTIVE ARTERIOGRAM OF THE POSTERIOR DIVISION OF THE LEFT RENAL ARTERY COMPARISON:  CT abdomen and pelvis-earlier same day MEDICATIONS: None ANESTHESIA/SEDATION: Moderate (conscious) sedation was employed during this procedure. A total of Versed 1 mg and Fentanyl 100 mcg was administered intravenously. Moderate Sedation Time: 58 minutes. The patient's level of consciousness and vital signs were monitored continuously by radiology nursing throughout the procedure under my direct supervision. CONTRAST:  80 cc Omnipaque 300 FLUOROSCOPY TIME:  11 minutes, 12 seconds (6,195 mGy) COMPLICATIONS: None immediate. PROCEDURE: Informed consent was obtained from the patient and the patient's wife following explanation of the procedure, risks, benefits and alternatives. All questions were addressed. A time out was performed prior to the initiation of the procedure. Maximal barrier sterile technique utilized including caps, mask, sterile gowns, sterile gloves, large sterile drape, hand hygiene, and Betadine prep. The right femoral head was marked fluoroscopically. Under sterile conditions and local anesthesia, the right common femoral artery access was performed with a micropuncture needle. Under direct ultrasound guidance, the right common femoral was accessed with a micropuncture kit. An  ultrasound image was saved for documentation purposes. This allowed for placement of a 5-French vascular sheath. A limited arteriogram was performed through the side arm of the sheath confirming appropriate access within the right common femoral artery. Over a Bentson wire, a Mickelson catheter was advanced the caudal aspect of the thoracic aorta where was reformed, back bled and flushed. The Mickelson catheter was then utilized to select the left renal artery and a selective left renal arteriogram was performed. Next, with the use of a fathom 14 microwire, a cantata microcatheter was utilized to select the anterior division of the left renal artery and a selective arteriogram was performed. Microcatheter was then utilized to select a tertiary branch of the anterior division of the left renal artery supplying an ill-defined area of active extravasation involving the inferior pole of the left kidney. The vessel was then percutaneously coil embolized with overlapping 2 mm and 3 mm interlock coils. Microcatheter was then utilized to select a separate tertiary branch of the anterior division of the left renal artery supplying an additional area  of ill-defined active extravasation involving the inferior pole the left kidney. The vessel was then percutaneously coil embolized with overlapping 2 mm and 3 mm interlock coils. Microcatheter was retracted to the more proximal aspect of the anterior division of the left renal artery and a selective arteriogram was performed The microcatheter was then utilized to select the posterior division of the left renal artery and a selective arteriogram was performed The microcatheter was removed and a completion arteriogram was performed via the use of the Mickelson catheter. All images were reviewed and the procedure was terminated. All wires, catheters and sheaths were removed from the patient. Hemostasis was achieved at the right groin access site with manual compression. A dressing  was applied. The patient tolerated the procedure well without immediate postprocedural complication. FINDINGS: Selective left renal arteriogram demonstrates markedly sluggish flow through the left renal artery likely attributable to a combination of the patient's known end-stage renal disease as well as large left-sided perinephric hematoma. Sub selective injection of the anterior division of the left renal artery demonstrates 2 ill-defined areas of active extravasation involving the inferior pole of the left kidney, findings compatible with preceding CT. Both tertiary branches of the anterior division of the left renal artery were successfully catheterized and percutaneously coil embolized. Post embolization arteriogram was negative for areas of residual extravasation. Selective injection of the posterior division of the left renal artery and completion main left renal arteriogram were negative for additional areas of vessel irregularity or contrast extravasation. IMPRESSION: Successful percutaneous coil embolization of 2 tertiary branches of the anterior division of the left renal artery supplying 2 ill-defined areas of contrast extravasation involving the inferior pole of the left kidney. PLAN: - The patient is to remain flat for 4 hours with right leg straight. - Continued aggressive resuscitation at the discretion of the critical care service. I would not be surprised if the patient's hematocrit level has not yet reached a nadir, however I am hopeful it will stabilize in the coming days. - Note, ultimately if additional percutaneous intervention is warranted, it would likely require a more global embolization placing the patient at risk of renal necrosis. Electronically Signed   By: Sandi Mariscal M.D.   On: 05/31/2022 11:58   IR Angiogram Selective Each Additional Vessel  Result Date: 05/31/2022 INDICATION: History of end-stage renal disease with fall earlier today, now with left-sided perisplenic hemorrhage  and hypotension. Patient presents today for left-sided renal arteriogram and potential percutaneous embolization. EXAM: 1. ULTRASOUND GUIDANCE FOR ARTERIAL ACCESS 2. SELECTIVE LEFT RENAL ARTERIOGRAM 3. SUB SELECTIVE ARTERIOGRAM OF THE ANTERIOR DIVISION OF THE LEFT RENAL ARTERY 4. SUB SELECTIVE PERCUTANEOUS COIL EMBOLIZATION OF TWO SEPARATE TERTIARY ARTERIES OF THE ANTERIOR DIVISION OF THE LEFT RENAL ARTERY. 5. SUB SELECTIVE ARTERIOGRAM OF THE POSTERIOR DIVISION OF THE LEFT RENAL ARTERY COMPARISON:  CT abdomen and pelvis-earlier same day MEDICATIONS: None ANESTHESIA/SEDATION: Moderate (conscious) sedation was employed during this procedure. A total of Versed 1 mg and Fentanyl 100 mcg was administered intravenously. Moderate Sedation Time: 58 minutes. The patient's level of consciousness and vital signs were monitored continuously by radiology nursing throughout the procedure under my direct supervision. CONTRAST:  80 cc Omnipaque 300 FLUOROSCOPY TIME:  11 minutes, 12 seconds (3,818 mGy) COMPLICATIONS: None immediate. PROCEDURE: Informed consent was obtained from the patient and the patient's wife following explanation of the procedure, risks, benefits and alternatives. All questions were addressed. A time out was performed prior to the initiation of the procedure. Maximal barrier sterile technique utilized including  caps, mask, sterile gowns, sterile gloves, large sterile drape, hand hygiene, and Betadine prep. The right femoral head was marked fluoroscopically. Under sterile conditions and local anesthesia, the right common femoral artery access was performed with a micropuncture needle. Under direct ultrasound guidance, the right common femoral was accessed with a micropuncture kit. An ultrasound image was saved for documentation purposes. This allowed for placement of a 5-French vascular sheath. A limited arteriogram was performed through the side arm of the sheath confirming appropriate access within the right  common femoral artery. Over a Bentson wire, a Mickelson catheter was advanced the caudal aspect of the thoracic aorta where was reformed, back bled and flushed. The Mickelson catheter was then utilized to select the left renal artery and a selective left renal arteriogram was performed. Next, with the use of a fathom 14 microwire, a cantata microcatheter was utilized to select the anterior division of the left renal artery and a selective arteriogram was performed. Microcatheter was then utilized to select a tertiary branch of the anterior division of the left renal artery supplying an ill-defined area of active extravasation involving the inferior pole of the left kidney. The vessel was then percutaneously coil embolized with overlapping 2 mm and 3 mm interlock coils. Microcatheter was then utilized to select a separate tertiary branch of the anterior division of the left renal artery supplying an additional area of ill-defined active extravasation involving the inferior pole the left kidney. The vessel was then percutaneously coil embolized with overlapping 2 mm and 3 mm interlock coils. Microcatheter was retracted to the more proximal aspect of the anterior division of the left renal artery and a selective arteriogram was performed The microcatheter was then utilized to select the posterior division of the left renal artery and a selective arteriogram was performed The microcatheter was removed and a completion arteriogram was performed via the use of the Mickelson catheter. All images were reviewed and the procedure was terminated. All wires, catheters and sheaths were removed from the patient. Hemostasis was achieved at the right groin access site with manual compression. A dressing was applied. The patient tolerated the procedure well without immediate postprocedural complication. FINDINGS: Selective left renal arteriogram demonstrates markedly sluggish flow through the left renal artery likely attributable to  a combination of the patient's known end-stage renal disease as well as large left-sided perinephric hematoma. Sub selective injection of the anterior division of the left renal artery demonstrates 2 ill-defined areas of active extravasation involving the inferior pole of the left kidney, findings compatible with preceding CT. Both tertiary branches of the anterior division of the left renal artery were successfully catheterized and percutaneously coil embolized. Post embolization arteriogram was negative for areas of residual extravasation. Selective injection of the posterior division of the left renal artery and completion main left renal arteriogram were negative for additional areas of vessel irregularity or contrast extravasation. IMPRESSION: Successful percutaneous coil embolization of 2 tertiary branches of the anterior division of the left renal artery supplying 2 ill-defined areas of contrast extravasation involving the inferior pole of the left kidney. PLAN: - The patient is to remain flat for 4 hours with right leg straight. - Continued aggressive resuscitation at the discretion of the critical care service. I would not be surprised if the patient's hematocrit level has not yet reached a nadir, however I am hopeful it will stabilize in the coming days. - Note, ultimately if additional percutaneous intervention is warranted, it would likely require a more global embolization placing the patient  at risk of renal necrosis. Electronically Signed   By: Sandi Mariscal M.D.   On: 05/31/2022 11:58   IR Angiogram Selective Each Additional Vessel  Result Date: 05/31/2022 INDICATION: History of end-stage renal disease with fall earlier today, now with left-sided perisplenic hemorrhage and hypotension. Patient presents today for left-sided renal arteriogram and potential percutaneous embolization. EXAM: 1. ULTRASOUND GUIDANCE FOR ARTERIAL ACCESS 2. SELECTIVE LEFT RENAL ARTERIOGRAM 3. SUB SELECTIVE ARTERIOGRAM OF THE  ANTERIOR DIVISION OF THE LEFT RENAL ARTERY 4. SUB SELECTIVE PERCUTANEOUS COIL EMBOLIZATION OF TWO SEPARATE TERTIARY ARTERIES OF THE ANTERIOR DIVISION OF THE LEFT RENAL ARTERY. 5. SUB SELECTIVE ARTERIOGRAM OF THE POSTERIOR DIVISION OF THE LEFT RENAL ARTERY COMPARISON:  CT abdomen and pelvis-earlier same day MEDICATIONS: None ANESTHESIA/SEDATION: Moderate (conscious) sedation was employed during this procedure. A total of Versed 1 mg and Fentanyl 100 mcg was administered intravenously. Moderate Sedation Time: 58 minutes. The patient's level of consciousness and vital signs were monitored continuously by radiology nursing throughout the procedure under my direct supervision. CONTRAST:  80 cc Omnipaque 300 FLUOROSCOPY TIME:  11 minutes, 12 seconds (3,790 mGy) COMPLICATIONS: None immediate. PROCEDURE: Informed consent was obtained from the patient and the patient's wife following explanation of the procedure, risks, benefits and alternatives. All questions were addressed. A time out was performed prior to the initiation of the procedure. Maximal barrier sterile technique utilized including caps, mask, sterile gowns, sterile gloves, large sterile drape, hand hygiene, and Betadine prep. The right femoral head was marked fluoroscopically. Under sterile conditions and local anesthesia, the right common femoral artery access was performed with a micropuncture needle. Under direct ultrasound guidance, the right common femoral was accessed with a micropuncture kit. An ultrasound image was saved for documentation purposes. This allowed for placement of a 5-French vascular sheath. A limited arteriogram was performed through the side arm of the sheath confirming appropriate access within the right common femoral artery. Over a Bentson wire, a Mickelson catheter was advanced the caudal aspect of the thoracic aorta where was reformed, back bled and flushed. The Mickelson catheter was then utilized to select the left renal artery and  a selective left renal arteriogram was performed. Next, with the use of a fathom 14 microwire, a cantata microcatheter was utilized to select the anterior division of the left renal artery and a selective arteriogram was performed. Microcatheter was then utilized to select a tertiary branch of the anterior division of the left renal artery supplying an ill-defined area of active extravasation involving the inferior pole of the left kidney. The vessel was then percutaneously coil embolized with overlapping 2 mm and 3 mm interlock coils. Microcatheter was then utilized to select a separate tertiary branch of the anterior division of the left renal artery supplying an additional area of ill-defined active extravasation involving the inferior pole the left kidney. The vessel was then percutaneously coil embolized with overlapping 2 mm and 3 mm interlock coils. Microcatheter was retracted to the more proximal aspect of the anterior division of the left renal artery and a selective arteriogram was performed The microcatheter was then utilized to select the posterior division of the left renal artery and a selective arteriogram was performed The microcatheter was removed and a completion arteriogram was performed via the use of the Mickelson catheter. All images were reviewed and the procedure was terminated. All wires, catheters and sheaths were removed from the patient. Hemostasis was achieved at the right groin access site with manual compression. A dressing was applied. The patient tolerated  the procedure well without immediate postprocedural complication. FINDINGS: Selective left renal arteriogram demonstrates markedly sluggish flow through the left renal artery likely attributable to a combination of the patient's known end-stage renal disease as well as large left-sided perinephric hematoma. Sub selective injection of the anterior division of the left renal artery demonstrates 2 ill-defined areas of active  extravasation involving the inferior pole of the left kidney, findings compatible with preceding CT. Both tertiary branches of the anterior division of the left renal artery were successfully catheterized and percutaneously coil embolized. Post embolization arteriogram was negative for areas of residual extravasation. Selective injection of the posterior division of the left renal artery and completion main left renal arteriogram were negative for additional areas of vessel irregularity or contrast extravasation. IMPRESSION: Successful percutaneous coil embolization of 2 tertiary branches of the anterior division of the left renal artery supplying 2 ill-defined areas of contrast extravasation involving the inferior pole of the left kidney. PLAN: - The patient is to remain flat for 4 hours with right leg straight. - Continued aggressive resuscitation at the discretion of the critical care service. I would not be surprised if the patient's hematocrit level has not yet reached a nadir, however I am hopeful it will stabilize in the coming days. - Note, ultimately if additional percutaneous intervention is warranted, it would likely require a more global embolization placing the patient at risk of renal necrosis. Electronically Signed   By: Sandi Mariscal M.D.   On: 05/31/2022 11:58   IR Angiogram Selective Each Additional Vessel  Result Date: 05/31/2022 INDICATION: History of end-stage renal disease with fall earlier today, now with left-sided perisplenic hemorrhage and hypotension. Patient presents today for left-sided renal arteriogram and potential percutaneous embolization. EXAM: 1. ULTRASOUND GUIDANCE FOR ARTERIAL ACCESS 2. SELECTIVE LEFT RENAL ARTERIOGRAM 3. SUB SELECTIVE ARTERIOGRAM OF THE ANTERIOR DIVISION OF THE LEFT RENAL ARTERY 4. SUB SELECTIVE PERCUTANEOUS COIL EMBOLIZATION OF TWO SEPARATE TERTIARY ARTERIES OF THE ANTERIOR DIVISION OF THE LEFT RENAL ARTERY. 5. SUB SELECTIVE ARTERIOGRAM OF THE POSTERIOR  DIVISION OF THE LEFT RENAL ARTERY COMPARISON:  CT abdomen and pelvis-earlier same day MEDICATIONS: None ANESTHESIA/SEDATION: Moderate (conscious) sedation was employed during this procedure. A total of Versed 1 mg and Fentanyl 100 mcg was administered intravenously. Moderate Sedation Time: 58 minutes. The patient's level of consciousness and vital signs were monitored continuously by radiology nursing throughout the procedure under my direct supervision. CONTRAST:  80 cc Omnipaque 300 FLUOROSCOPY TIME:  11 minutes, 12 seconds (1,914 mGy) COMPLICATIONS: None immediate. PROCEDURE: Informed consent was obtained from the patient and the patient's wife following explanation of the procedure, risks, benefits and alternatives. All questions were addressed. A time out was performed prior to the initiation of the procedure. Maximal barrier sterile technique utilized including caps, mask, sterile gowns, sterile gloves, large sterile drape, hand hygiene, and Betadine prep. The right femoral head was marked fluoroscopically. Under sterile conditions and local anesthesia, the right common femoral artery access was performed with a micropuncture needle. Under direct ultrasound guidance, the right common femoral was accessed with a micropuncture kit. An ultrasound image was saved for documentation purposes. This allowed for placement of a 5-French vascular sheath. A limited arteriogram was performed through the side arm of the sheath confirming appropriate access within the right common femoral artery. Over a Bentson wire, a Mickelson catheter was advanced the caudal aspect of the thoracic aorta where was reformed, back bled and flushed. The Mickelson catheter was then utilized to select the left renal artery  and a selective left renal arteriogram was performed. Next, with the use of a fathom 14 microwire, a cantata microcatheter was utilized to select the anterior division of the left renal artery and a selective arteriogram was  performed. Microcatheter was then utilized to select a tertiary branch of the anterior division of the left renal artery supplying an ill-defined area of active extravasation involving the inferior pole of the left kidney. The vessel was then percutaneously coil embolized with overlapping 2 mm and 3 mm interlock coils. Microcatheter was then utilized to select a separate tertiary branch of the anterior division of the left renal artery supplying an additional area of ill-defined active extravasation involving the inferior pole the left kidney. The vessel was then percutaneously coil embolized with overlapping 2 mm and 3 mm interlock coils. Microcatheter was retracted to the more proximal aspect of the anterior division of the left renal artery and a selective arteriogram was performed The microcatheter was then utilized to select the posterior division of the left renal artery and a selective arteriogram was performed The microcatheter was removed and a completion arteriogram was performed via the use of the Mickelson catheter. All images were reviewed and the procedure was terminated. All wires, catheters and sheaths were removed from the patient. Hemostasis was achieved at the right groin access site with manual compression. A dressing was applied. The patient tolerated the procedure well without immediate postprocedural complication. FINDINGS: Selective left renal arteriogram demonstrates markedly sluggish flow through the left renal artery likely attributable to a combination of the patient's known end-stage renal disease as well as large left-sided perinephric hematoma. Sub selective injection of the anterior division of the left renal artery demonstrates 2 ill-defined areas of active extravasation involving the inferior pole of the left kidney, findings compatible with preceding CT. Both tertiary branches of the anterior division of the left renal artery were successfully catheterized and percutaneously coil  embolized. Post embolization arteriogram was negative for areas of residual extravasation. Selective injection of the posterior division of the left renal artery and completion main left renal arteriogram were negative for additional areas of vessel irregularity or contrast extravasation. IMPRESSION: Successful percutaneous coil embolization of 2 tertiary branches of the anterior division of the left renal artery supplying 2 ill-defined areas of contrast extravasation involving the inferior pole of the left kidney. PLAN: - The patient is to remain flat for 4 hours with right leg straight. - Continued aggressive resuscitation at the discretion of the critical care service. I would not be surprised if the patient's hematocrit level has not yet reached a nadir, however I am hopeful it will stabilize in the coming days. - Note, ultimately if additional percutaneous intervention is warranted, it would likely require a more global embolization placing the patient at risk of renal necrosis. Electronically Signed   By: Sandi Mariscal M.D.   On: 05/31/2022 11:58   IR Angiogram Selective Each Additional Vessel  Result Date: 05/31/2022 INDICATION: History of end-stage renal disease with fall earlier today, now with left-sided perisplenic hemorrhage and hypotension. Patient presents today for left-sided renal arteriogram and potential percutaneous embolization. EXAM: 1. ULTRASOUND GUIDANCE FOR ARTERIAL ACCESS 2. SELECTIVE LEFT RENAL ARTERIOGRAM 3. SUB SELECTIVE ARTERIOGRAM OF THE ANTERIOR DIVISION OF THE LEFT RENAL ARTERY 4. SUB SELECTIVE PERCUTANEOUS COIL EMBOLIZATION OF TWO SEPARATE TERTIARY ARTERIES OF THE ANTERIOR DIVISION OF THE LEFT RENAL ARTERY. 5. SUB SELECTIVE ARTERIOGRAM OF THE POSTERIOR DIVISION OF THE LEFT RENAL ARTERY COMPARISON:  CT abdomen and pelvis-earlier same day MEDICATIONS:  None ANESTHESIA/SEDATION: Moderate (conscious) sedation was employed during this procedure. A total of Versed 1 mg and Fentanyl 100 mcg  was administered intravenously. Moderate Sedation Time: 58 minutes. The patient's level of consciousness and vital signs were monitored continuously by radiology nursing throughout the procedure under my direct supervision. CONTRAST:  80 cc Omnipaque 300 FLUOROSCOPY TIME:  11 minutes, 12 seconds (7,622 mGy) COMPLICATIONS: None immediate. PROCEDURE: Informed consent was obtained from the patient and the patient's wife following explanation of the procedure, risks, benefits and alternatives. All questions were addressed. A time out was performed prior to the initiation of the procedure. Maximal barrier sterile technique utilized including caps, mask, sterile gowns, sterile gloves, large sterile drape, hand hygiene, and Betadine prep. The right femoral head was marked fluoroscopically. Under sterile conditions and local anesthesia, the right common femoral artery access was performed with a micropuncture needle. Under direct ultrasound guidance, the right common femoral was accessed with a micropuncture kit. An ultrasound image was saved for documentation purposes. This allowed for placement of a 5-French vascular sheath. A limited arteriogram was performed through the side arm of the sheath confirming appropriate access within the right common femoral artery. Over a Bentson wire, a Mickelson catheter was advanced the caudal aspect of the thoracic aorta where was reformed, back bled and flushed. The Mickelson catheter was then utilized to select the left renal artery and a selective left renal arteriogram was performed. Next, with the use of a fathom 14 microwire, a cantata microcatheter was utilized to select the anterior division of the left renal artery and a selective arteriogram was performed. Microcatheter was then utilized to select a tertiary branch of the anterior division of the left renal artery supplying an ill-defined area of active extravasation involving the inferior pole of the left kidney. The vessel  was then percutaneously coil embolized with overlapping 2 mm and 3 mm interlock coils. Microcatheter was then utilized to select a separate tertiary branch of the anterior division of the left renal artery supplying an additional area of ill-defined active extravasation involving the inferior pole the left kidney. The vessel was then percutaneously coil embolized with overlapping 2 mm and 3 mm interlock coils. Microcatheter was retracted to the more proximal aspect of the anterior division of the left renal artery and a selective arteriogram was performed The microcatheter was then utilized to select the posterior division of the left renal artery and a selective arteriogram was performed The microcatheter was removed and a completion arteriogram was performed via the use of the Mickelson catheter. All images were reviewed and the procedure was terminated. All wires, catheters and sheaths were removed from the patient. Hemostasis was achieved at the right groin access site with manual compression. A dressing was applied. The patient tolerated the procedure well without immediate postprocedural complication. FINDINGS: Selective left renal arteriogram demonstrates markedly sluggish flow through the left renal artery likely attributable to a combination of the patient's known end-stage renal disease as well as large left-sided perinephric hematoma. Sub selective injection of the anterior division of the left renal artery demonstrates 2 ill-defined areas of active extravasation involving the inferior pole of the left kidney, findings compatible with preceding CT. Both tertiary branches of the anterior division of the left renal artery were successfully catheterized and percutaneously coil embolized. Post embolization arteriogram was negative for areas of residual extravasation. Selective injection of the posterior division of the left renal artery and completion main left renal arteriogram were negative for additional  areas of vessel  irregularity or contrast extravasation. IMPRESSION: Successful percutaneous coil embolization of 2 tertiary branches of the anterior division of the left renal artery supplying 2 ill-defined areas of contrast extravasation involving the inferior pole of the left kidney. PLAN: - The patient is to remain flat for 4 hours with right leg straight. - Continued aggressive resuscitation at the discretion of the critical care service. I would not be surprised if the patient's hematocrit level has not yet reached a nadir, however I am hopeful it will stabilize in the coming days. - Note, ultimately if additional percutaneous intervention is warranted, it would likely require a more global embolization placing the patient at risk of renal necrosis. Electronically Signed   By: Sandi Mariscal M.D.   On: 05/31/2022 11:58   IR Angiogram Selective Each Additional Vessel  Result Date: 05/31/2022 INDICATION: History of end-stage renal disease with fall earlier today, now with left-sided perisplenic hemorrhage and hypotension. Patient presents today for left-sided renal arteriogram and potential percutaneous embolization. EXAM: 1. ULTRASOUND GUIDANCE FOR ARTERIAL ACCESS 2. SELECTIVE LEFT RENAL ARTERIOGRAM 3. SUB SELECTIVE ARTERIOGRAM OF THE ANTERIOR DIVISION OF THE LEFT RENAL ARTERY 4. SUB SELECTIVE PERCUTANEOUS COIL EMBOLIZATION OF TWO SEPARATE TERTIARY ARTERIES OF THE ANTERIOR DIVISION OF THE LEFT RENAL ARTERY. 5. SUB SELECTIVE ARTERIOGRAM OF THE POSTERIOR DIVISION OF THE LEFT RENAL ARTERY COMPARISON:  CT abdomen and pelvis-earlier same day MEDICATIONS: None ANESTHESIA/SEDATION: Moderate (conscious) sedation was employed during this procedure. A total of Versed 1 mg and Fentanyl 100 mcg was administered intravenously. Moderate Sedation Time: 58 minutes. The patient's level of consciousness and vital signs were monitored continuously by radiology nursing throughout the procedure under my direct supervision. CONTRAST:   80 cc Omnipaque 300 FLUOROSCOPY TIME:  11 minutes, 12 seconds (1,771 mGy) COMPLICATIONS: None immediate. PROCEDURE: Informed consent was obtained from the patient and the patient's wife following explanation of the procedure, risks, benefits and alternatives. All questions were addressed. A time out was performed prior to the initiation of the procedure. Maximal barrier sterile technique utilized including caps, mask, sterile gowns, sterile gloves, large sterile drape, hand hygiene, and Betadine prep. The right femoral head was marked fluoroscopically. Under sterile conditions and local anesthesia, the right common femoral artery access was performed with a micropuncture needle. Under direct ultrasound guidance, the right common femoral was accessed with a micropuncture kit. An ultrasound image was saved for documentation purposes. This allowed for placement of a 5-French vascular sheath. A limited arteriogram was performed through the side arm of the sheath confirming appropriate access within the right common femoral artery. Over a Bentson wire, a Mickelson catheter was advanced the caudal aspect of the thoracic aorta where was reformed, back bled and flushed. The Mickelson catheter was then utilized to select the left renal artery and a selective left renal arteriogram was performed. Next, with the use of a fathom 14 microwire, a cantata microcatheter was utilized to select the anterior division of the left renal artery and a selective arteriogram was performed. Microcatheter was then utilized to select a tertiary branch of the anterior division of the left renal artery supplying an ill-defined area of active extravasation involving the inferior pole of the left kidney. The vessel was then percutaneously coil embolized with overlapping 2 mm and 3 mm interlock coils. Microcatheter was then utilized to select a separate tertiary branch of the anterior division of the left renal artery supplying an additional area  of ill-defined active extravasation involving the inferior pole the left kidney. The vessel was then  percutaneously coil embolized with overlapping 2 mm and 3 mm interlock coils. Microcatheter was retracted to the more proximal aspect of the anterior division of the left renal artery and a selective arteriogram was performed The microcatheter was then utilized to select the posterior division of the left renal artery and a selective arteriogram was performed The microcatheter was removed and a completion arteriogram was performed via the use of the Mickelson catheter. All images were reviewed and the procedure was terminated. All wires, catheters and sheaths were removed from the patient. Hemostasis was achieved at the right groin access site with manual compression. A dressing was applied. The patient tolerated the procedure well without immediate postprocedural complication. FINDINGS: Selective left renal arteriogram demonstrates markedly sluggish flow through the left renal artery likely attributable to a combination of the patient's known end-stage renal disease as well as large left-sided perinephric hematoma. Sub selective injection of the anterior division of the left renal artery demonstrates 2 ill-defined areas of active extravasation involving the inferior pole of the left kidney, findings compatible with preceding CT. Both tertiary branches of the anterior division of the left renal artery were successfully catheterized and percutaneously coil embolized. Post embolization arteriogram was negative for areas of residual extravasation. Selective injection of the posterior division of the left renal artery and completion main left renal arteriogram were negative for additional areas of vessel irregularity or contrast extravasation. IMPRESSION: Successful percutaneous coil embolization of 2 tertiary branches of the anterior division of the left renal artery supplying 2 ill-defined areas of contrast  extravasation involving the inferior pole of the left kidney. PLAN: - The patient is to remain flat for 4 hours with right leg straight. - Continued aggressive resuscitation at the discretion of the critical care service. I would not be surprised if the patient's hematocrit level has not yet reached a nadir, however I am hopeful it will stabilize in the coming days. - Note, ultimately if additional percutaneous intervention is warranted, it would likely require a more global embolization placing the patient at risk of renal necrosis. Electronically Signed   By: Sandi Mariscal M.D.   On: 05/31/2022 11:58   IR Angiogram Selective Each Additional Vessel  Result Date: 05/31/2022 INDICATION: History of end-stage renal disease with fall earlier today, now with left-sided perisplenic hemorrhage and hypotension. Patient presents today for left-sided renal arteriogram and potential percutaneous embolization. EXAM: 1. ULTRASOUND GUIDANCE FOR ARTERIAL ACCESS 2. SELECTIVE LEFT RENAL ARTERIOGRAM 3. SUB SELECTIVE ARTERIOGRAM OF THE ANTERIOR DIVISION OF THE LEFT RENAL ARTERY 4. SUB SELECTIVE PERCUTANEOUS COIL EMBOLIZATION OF TWO SEPARATE TERTIARY ARTERIES OF THE ANTERIOR DIVISION OF THE LEFT RENAL ARTERY. 5. SUB SELECTIVE ARTERIOGRAM OF THE POSTERIOR DIVISION OF THE LEFT RENAL ARTERY COMPARISON:  CT abdomen and pelvis-earlier same day MEDICATIONS: None ANESTHESIA/SEDATION: Moderate (conscious) sedation was employed during this procedure. A total of Versed 1 mg and Fentanyl 100 mcg was administered intravenously. Moderate Sedation Time: 58 minutes. The patient's level of consciousness and vital signs were monitored continuously by radiology nursing throughout the procedure under my direct supervision. CONTRAST:  80 cc Omnipaque 300 FLUOROSCOPY TIME:  11 minutes, 12 seconds (5,366 mGy) COMPLICATIONS: None immediate. PROCEDURE: Informed consent was obtained from the patient and the patient's wife following explanation of the  procedure, risks, benefits and alternatives. All questions were addressed. A time out was performed prior to the initiation of the procedure. Maximal barrier sterile technique utilized including caps, mask, sterile gowns, sterile gloves, large sterile drape, hand hygiene, and Betadine prep. The  right femoral head was marked fluoroscopically. Under sterile conditions and local anesthesia, the right common femoral artery access was performed with a micropuncture needle. Under direct ultrasound guidance, the right common femoral was accessed with a micropuncture kit. An ultrasound image was saved for documentation purposes. This allowed for placement of a 5-French vascular sheath. A limited arteriogram was performed through the side arm of the sheath confirming appropriate access within the right common femoral artery. Over a Bentson wire, a Mickelson catheter was advanced the caudal aspect of the thoracic aorta where was reformed, back bled and flushed. The Mickelson catheter was then utilized to select the left renal artery and a selective left renal arteriogram was performed. Next, with the use of a fathom 14 microwire, a cantata microcatheter was utilized to select the anterior division of the left renal artery and a selective arteriogram was performed. Microcatheter was then utilized to select a tertiary branch of the anterior division of the left renal artery supplying an ill-defined area of active extravasation involving the inferior pole of the left kidney. The vessel was then percutaneously coil embolized with overlapping 2 mm and 3 mm interlock coils. Microcatheter was then utilized to select a separate tertiary branch of the anterior division of the left renal artery supplying an additional area of ill-defined active extravasation involving the inferior pole the left kidney. The vessel was then percutaneously coil embolized with overlapping 2 mm and 3 mm interlock coils. Microcatheter was retracted to the  more proximal aspect of the anterior division of the left renal artery and a selective arteriogram was performed The microcatheter was then utilized to select the posterior division of the left renal artery and a selective arteriogram was performed The microcatheter was removed and a completion arteriogram was performed via the use of the Mickelson catheter. All images were reviewed and the procedure was terminated. All wires, catheters and sheaths were removed from the patient. Hemostasis was achieved at the right groin access site with manual compression. A dressing was applied. The patient tolerated the procedure well without immediate postprocedural complication. FINDINGS: Selective left renal arteriogram demonstrates markedly sluggish flow through the left renal artery likely attributable to a combination of the patient's known end-stage renal disease as well as large left-sided perinephric hematoma. Sub selective injection of the anterior division of the left renal artery demonstrates 2 ill-defined areas of active extravasation involving the inferior pole of the left kidney, findings compatible with preceding CT. Both tertiary branches of the anterior division of the left renal artery were successfully catheterized and percutaneously coil embolized. Post embolization arteriogram was negative for areas of residual extravasation. Selective injection of the posterior division of the left renal artery and completion main left renal arteriogram were negative for additional areas of vessel irregularity or contrast extravasation. IMPRESSION: Successful percutaneous coil embolization of 2 tertiary branches of the anterior division of the left renal artery supplying 2 ill-defined areas of contrast extravasation involving the inferior pole of the left kidney. PLAN: - The patient is to remain flat for 4 hours with right leg straight. - Continued aggressive resuscitation at the discretion of the critical care service. I  would not be surprised if the patient's hematocrit level has not yet reached a nadir, however I am hopeful it will stabilize in the coming days. - Note, ultimately if additional percutaneous intervention is warranted, it would likely require a more global embolization placing the patient at risk of renal necrosis. Electronically Signed   By: Eldridge Abrahams.D.  On: 05/31/2022 11:58   DG Knee Left Port  Result Date: 05/30/2022 CLINICAL DATA:  Fall EXAM: PORTABLE LEFT KNEE - 1-2 VIEW COMPARISON:  None Available. FINDINGS: No acute fracture or dislocation of the left knee. Plate and screw fixation of the lateral tibial plateau with underlying, chronic, sclerotic fracture. Joint spaces are well preserved. No knee joint effusion. Vascular calcinosis. IMPRESSION: No acute fracture or dislocation of the left knee. Plate and screw fixation of the lateral tibial plateau with underlying, chronic, sclerotic fracture. Electronically Signed   By: Delanna Ahmadi M.D.   On: 05/30/2022 16:40   CT ABDOMEN PELVIS W CONTRAST  Result Date: 05/30/2022 CLINICAL DATA:  Nonlocalized abdominal pain. EXAM: CT ABDOMEN AND PELVIS WITH CONTRAST TECHNIQUE: Multidetector CT imaging of the abdomen and pelvis was performed using the standard protocol following bolus administration of intravenous contrast. RADIATION DOSE REDUCTION: This exam was performed according to the departmental dose-optimization program which includes automated exposure control, adjustment of the mA and/or kV according to patient size and/or use of iterative reconstruction technique. CONTRAST:  175m OMNIPAQUE IOHEXOL 350 MG/ML SOLN COMPARISON:  Abdomen/pelvis CT 04/07/2019 FINDINGS: Lower chest: Please see report for chest CTA performed at the same time. Hepatobiliary: No suspicious focal abnormality within the liver parenchyma. Gallbladder is surgically absent. Pneumobilia suggests prior sphincterotomy. Pancreas: No focal mass lesion. No dilatation of the main duct. No  intraparenchymal cyst. No peripancreatic edema. Spleen: No splenomegaly. No focal mass lesion. Adrenals/Urinary Tract: Stable 14 mm left adrenal nodule consistent with adenoma. No followup recommended. Right adrenal gland unremarkable. Innumerable small cystic lesions identified in the right kidney, the more [place] which are too small to characterize on this exam. Innumerable cystic foci are seen in the left kidney with a large perinephric heterogeneous collection compatible with hematoma. Hematoma is probably subcapsular and measures 14.9 x 6.4 x 17.3 cm. Foci of increased attenuation in the interpolar left kidney between the parenchyma and the hematoma (axial 46/1 and coronal 87/6) raise the question of active extravasation. There is stranding around the left kidney compatible with edema/hemorrhage. Hemorrhage extends down the retroperitoneum into the left pelvic sidewall. Hematoma generates mass-effect on the left kidney. No hydroureteronephrosis. Bladder is decompressed. Stomach/Bowel: Tiny hiatal hernia. Stomach otherwise unremarkable. Duodenum is normally positioned as is the ligament of Treitz. No small bowel wall thickening. No small bowel dilatation. The terminal ileum is normal. The appendix is normal. No gross colonic mass. No colonic wall thickening. Vascular/Lymphatic: There is advanced atherosclerotic calcification of the abdominal aorta without aneurysm. There is no gastrohepatic or hepatoduodenal ligament lymphadenopathy. No retroperitoneal or mesenteric lymphadenopathy. No pelvic sidewall lymphadenopathy. Reproductive: The prostate gland and seminal vesicles are unremarkable. Other: No substantial intraperitoneal free fluid. Musculoskeletal: No worrisome lytic or sclerotic osseous abnormality. Bilateral pars interarticularis defects noted at L5. IMPRESSION: 1. Markedly large left perinephric/subcapsular hematoma involving the left kidney. There are adjacent foci of ill-defined increased  attenuation at the margin of the kidney and the hematoma raising concern for active or ongoing bleeding. Hemorrhage extends in the retroperitoneal tissues inferiorly to the extraperitoneal left pelvic sidewall. Follow-up recommended to exclude hemorrhage secondary to underlying mass lesion. 2. Innumerable tiny cystic lesions in both kidneys, too small to characterize and progressive in the interval since 2020. 3. Pneumobilia suggests prior sphincterotomy. 4. 14 mm left adrenal adenoma. Critical Value/emergent results were called by telephone at the time of interpretation on 05/30/2022 at 12:14 pm [facility], who verbally acknowledged these results. Electronically Signed   By:  Misty Stanley M.D.   On: 05/30/2022 12:15   CT Angio Chest PE W and/or Wo Contrast  Result Date: 05/30/2022 CLINICAL DATA:  Nonlocalized abdominal pain. EXAM: CT ABDOMEN AND PELVIS WITH CONTRAST TECHNIQUE: Multidetector CT imaging of the abdomen and pelvis was performed using the standard protocol following bolus administration of intravenous contrast. RADIATION DOSE REDUCTION: This exam was performed according to the departmental dose-optimization program which includes automated exposure control, adjustment of the mA and/or kV according to patient size and/or use of iterative reconstruction technique. CONTRAST:  142m OMNIPAQUE IOHEXOL 350 MG/ML SOLN COMPARISON:  Abdomen/pelvis CT 04/07/2019 FINDINGS: Lower chest: Please see report for chest CTA performed at the same time. Hepatobiliary: No suspicious focal abnormality within the liver parenchyma. Gallbladder is surgically absent. Pneumobilia suggests prior sphincterotomy. Pancreas: No focal mass lesion. No dilatation of the main duct. No intraparenchymal cyst. No peripancreatic edema. Spleen: No splenomegaly. No focal mass lesion. Adrenals/Urinary Tract: Stable 14 mm left adrenal nodule consistent with adenoma. No followup recommended. Right adrenal gland unremarkable.  Innumerable small cystic lesions identified in the right kidney, the more [place] which are too small to characterize on this exam. Innumerable cystic foci are seen in the left kidney with a large perinephric heterogeneous collection compatible with hematoma. Hematoma is probably subcapsular and measures 14.9 x 6.4 x 17.3 cm. Foci of increased attenuation in the interpolar left kidney between the parenchyma and the hematoma (axial 46/1 and coronal 87/6) raise the question of active extravasation. There is stranding around the left kidney compatible with edema/hemorrhage. Hemorrhage extends down the retroperitoneum into the left pelvic sidewall. Hematoma generates mass-effect on the left kidney. No hydroureteronephrosis. Bladder is decompressed. Stomach/Bowel: Tiny hiatal hernia. Stomach otherwise unremarkable. Duodenum is normally positioned as is the ligament of Treitz. No small bowel wall thickening. No small bowel dilatation. The terminal ileum is normal. The appendix is normal. No gross colonic mass. No colonic wall thickening. Vascular/Lymphatic: There is advanced atherosclerotic calcification of the abdominal aorta without aneurysm. There is no gastrohepatic or hepatoduodenal ligament lymphadenopathy. No retroperitoneal or mesenteric lymphadenopathy. No pelvic sidewall lymphadenopathy. Reproductive: The prostate gland and seminal vesicles are unremarkable. Other: No substantial intraperitoneal free fluid. Musculoskeletal: No worrisome lytic or sclerotic osseous abnormality. Bilateral pars interarticularis defects noted at L5. IMPRESSION: 1. Markedly large left perinephric/subcapsular hematoma involving the left kidney. There are adjacent foci of ill-defined increased attenuation at the margin of the kidney and the hematoma raising concern for active or ongoing bleeding. Hemorrhage extends in the retroperitoneal tissues inferiorly to the extraperitoneal left pelvic sidewall. Follow-up recommended to exclude  hemorrhage secondary to underlying mass lesion. 2. Innumerable tiny cystic lesions in both kidneys, too small to characterize and progressive in the interval since 2020. 3. Pneumobilia suggests prior sphincterotomy. 4. 14 mm left adrenal adenoma. Critical Value/emergent results were called by telephone at the time of interpretation on 05/30/2022 at 12:14 pm to provider [facility], who verbally acknowledged these results. Electronically Signed   By: EMisty StanleyM.D.   On: 05/30/2022 12:15   DG Chest 1 View  Result Date: 05/30/2022 CLINICAL DATA:  Questionable sepsis. EXAM: CHEST  1 VIEW COMPARISON:  01/02/2022 FINDINGS: Stable cardiomediastinal contours. Lung volumes are low. No pleural effusion or edema. No airspace opacities identified. Visualized osseous structures appear intact. IMPRESSION: Low lung volumes. Electronically Signed   By: TKerby MoorsM.D.   On: 05/30/2022 11:53    Chlorhexidine Gluconate Cloth  6 each Topical Daily   Chlorhexidine Gluconate Cloth  6 each Topical Q0600   insulin aspart  0-6 Units Subcutaneous Q4H   sodium zirconium cyclosilicate  10 g Oral Daily    BMET    Component Value Date/Time   NA 133 (L) 06/01/2022 0210   K 4.5 06/01/2022 0210   CL 92 (L) 06/01/2022 0210   CO2 25 06/01/2022 0210   GLUCOSE 153 (H) 06/01/2022 0210   BUN 40 (H) 06/01/2022 0210   CREATININE 6.74 (H) 06/01/2022 0210   CALCIUM 8.8 (L) 06/01/2022 0210   GFRNONAA 8 (L) 06/01/2022 0210   GFRAA 7 (L) 10/26/2019 1626   CBC    Component Value Date/Time   WBC 10.3 06/01/2022 0210   RBC 2.65 (L) 06/01/2022 0210   HGB 8.0 (L) 06/01/2022 0759   HCT 24.2 (L) 06/01/2022 0759   PLT 188 06/01/2022 0210   MCV 90.2 06/01/2022 0210   MCH 30.2 06/01/2022 0210   MCHC 33.5 06/01/2022 0210   RDW 15.6 (H) 06/01/2022 0210   LYMPHSABS 0.5 (L) 05/30/2022 1051   MONOABS 0.8 05/30/2022 1051   EOSABS 0.0 05/30/2022 1051   BASOSABS 0.0 05/30/2022 1051    Dialysis Orders:  Unit: [facility] TTS Time:  3:45  EDW: 90.2 kg Flows: 425/800  [place]: 2K/2Ca  Access: AVF 15 g Heparin: None ESA: Mircera 30 q 4 wks (to start 7/11) VDRA: Calcitriol 2.25 q HD    Assessment/Plan: Left kidney hematoma/ABLA - large perinephric/subscapular hematoma of left kidney after fall at home. IR consulted - s/p embolization 05/30/22. Per primary team  ESRD -  HD TTS. Does not complete full treatments as outpatient. K+ 5.2 prior to transfusion. Improved 4.5.  Continue with TTS schedule.  Hypertension/volume  - Hypotensive on admission in the setting of blood loss. Repletion with prbcs. HD if BP permits.  Acute blood loss anemia   - Hgb 7.5 on admission. 3 units prbcs ordered and Hgb up to 11.5 today. Follow trends  Atrial fib with RVR - per primary svc Metabolic bone disease -  Calcium acceptable. Continue home meds  Nutrition - Renal diet/fluid restriction when eating DM -per primary PAD - LLE ulcer - follows with Dr. Donzetta Matters CAD Thrombocytopenia - likely due to recent ABLA.  Donetta Potts, MD [facility]

## 2022-06-01 NOTE — Progress Notes (Signed)
   06/01/22 2247  Assess: MEWS Score  Temp 98.3 F (36.8 C)  BP 93/62  MAP (mmHg) 74  Pulse Rate (!) 103  ECG Heart Rate (!) 104  Resp (!) 23  SpO2 98 %  O2 Device Room Air  Assess: MEWS Score  MEWS Temp 0  MEWS Systolic 1  MEWS Pulse 1  MEWS RR 1  MEWS LOC 0  MEWS Score 3  MEWS Score Color Yellow  Assess: if the MEWS score is Yellow or Red  Were vital signs taken at a resting state? Yes  Focused Assessment No change from prior assessment  Does the patient meet 2 or more of the SIRS criteria? No  MEWS guidelines implemented *See Row Information* Yes  Take Vital Signs  Increase Vital Sign Frequency  Yellow: Q 2hr X 2 then Q 4hr X 2, if remains yellow, continue Q 4hrs  Escalate  MEWS: Escalate Yellow: discuss with charge nurse/RN and consider discussing with provider and RRT  Notify: Charge Nurse/RN  Name of Charge Nurse/RN Notified Christina, RN  Date Charge Nurse/RN Notified 06/01/22  Time Charge Nurse/RN Notified 2250  Document  Patient Outcome Not stable and remains on department  Progress note created (see row info) Yes  Assess: SIRS CRITERIA  SIRS Temperature  0  SIRS Pulse 1  SIRS Respirations  1  SIRS WBC 0  SIRS Score Sum  2

## 2022-06-02 ENCOUNTER — Inpatient Hospital Stay (HOSPITAL_COMMUNITY): Payer: Medicare Other

## 2022-06-02 DIAGNOSIS — R000 Tachycardia, unspecified: Secondary | ICD-10-CM | POA: Insufficient documentation

## 2022-06-02 DIAGNOSIS — I96 Gangrene, not elsewhere classified: Secondary | ICD-10-CM

## 2022-06-02 DIAGNOSIS — D696 Thrombocytopenia, unspecified: Secondary | ICD-10-CM

## 2022-06-02 DIAGNOSIS — I5020 Unspecified systolic (congestive) heart failure: Secondary | ICD-10-CM

## 2022-06-02 DIAGNOSIS — D62 Acute posthemorrhagic anemia: Secondary | ICD-10-CM | POA: Diagnosis not present

## 2022-06-02 DIAGNOSIS — W19XXXA Unspecified fall, initial encounter: Secondary | ICD-10-CM

## 2022-06-02 DIAGNOSIS — I739 Peripheral vascular disease, unspecified: Secondary | ICD-10-CM

## 2022-06-02 DIAGNOSIS — S37092A Other injury of left kidney, initial encounter: Secondary | ICD-10-CM

## 2022-06-02 DIAGNOSIS — I480 Paroxysmal atrial fibrillation: Secondary | ICD-10-CM

## 2022-06-02 DIAGNOSIS — J449 Chronic obstructive pulmonary disease, unspecified: Secondary | ICD-10-CM

## 2022-06-02 DIAGNOSIS — E875 Hyperkalemia: Secondary | ICD-10-CM

## 2022-06-02 LAB — GLUCOSE, CAPILLARY
Glucose-Capillary: 104 mg/dL — ABNORMAL HIGH (ref 70–99)
Glucose-Capillary: 110 mg/dL — ABNORMAL HIGH (ref 70–99)
Glucose-Capillary: 125 mg/dL — ABNORMAL HIGH (ref 70–99)
Glucose-Capillary: 130 mg/dL — ABNORMAL HIGH (ref 70–99)
Glucose-Capillary: 141 mg/dL — ABNORMAL HIGH (ref 70–99)
Glucose-Capillary: 97 mg/dL (ref 70–99)

## 2022-06-02 LAB — BASIC METABOLIC PANEL
Anion gap: 20 — ABNORMAL HIGH (ref 5–15)
BUN: 58 mg/dL — ABNORMAL HIGH (ref 8–23)
CO2: 24 mmol/L (ref 22–32)
Calcium: 8.8 mg/dL — ABNORMAL LOW (ref 8.9–10.3)
Chloride: 89 mmol/L — ABNORMAL LOW (ref 98–111)
Creatinine, Ser: 7.77 mg/dL — ABNORMAL HIGH (ref 0.61–1.24)
GFR, Estimated: 7 mL/min — ABNORMAL LOW (ref >60)
Glucose, Bld: 154 mg/dL — ABNORMAL HIGH (ref 70–99)
Potassium: 4.7 mmol/L (ref 3.5–5.1)
Sodium: 133 mmol/L — ABNORMAL LOW (ref 135–145)

## 2022-06-02 LAB — CBC
HCT: 25.1 % — ABNORMAL LOW (ref 39.0–52.0)
HCT: 26.7 % — ABNORMAL LOW (ref 39.0–52.0)
Hemoglobin: 8.2 g/dL — ABNORMAL LOW (ref 13.0–17.0)
Hemoglobin: 8.9 g/dL — ABNORMAL LOW (ref 13.0–17.0)
MCH: 29.8 pg (ref 26.0–34.0)
MCH: 30.2 pg (ref 26.0–34.0)
MCHC: 32.7 g/dL (ref 30.0–36.0)
MCHC: 33.3 g/dL (ref 30.0–36.0)
MCV: 91.3 fL (ref 80.0–100.0)
MCV: 90.5 fL (ref 80.0–100.0)
Platelets: 177 10*3/uL (ref 150–400)
Platelets: 202 10*3/uL (ref 150–400)
RBC: 2.75 MIL/uL — ABNORMAL LOW (ref 4.22–5.81)
RBC: 2.95 MIL/uL — ABNORMAL LOW (ref 4.22–5.81)
RDW: 15.4 % (ref 11.5–15.5)
RDW: 15.3 % (ref 11.5–15.5)
WBC: 9 10*3/uL (ref 4.0–10.5)
WBC: 10 10*3/uL (ref 4.0–10.5)
nRBC: 0 % (ref 0.0–0.2)
nRBC: 0 % (ref 0.0–0.2)

## 2022-06-02 LAB — BPAM RBC
Blood Product Expiration Date: 202307262359
Blood Product Expiration Date: 202307262359
Blood Product Expiration Date: 202308032359
Blood Product Expiration Date: 202308092359
ISSUE DATE / TIME: 202307071322
ISSUE DATE / TIME: 202307071322
ISSUE DATE / TIME: 202307071832
ISSUE DATE / TIME: 202307091636
Unit Type and Rh: 5100
Unit Type and Rh: 5100
Unit Type and Rh: 6200
Unit Type and Rh: 6200

## 2022-06-02 LAB — TYPE AND SCREEN
ABO/RH(D): A POS
Antibody Screen: NEGATIVE
Unit division: 0
Unit division: 0
Unit division: 0
Unit division: 0

## 2022-06-02 MED ORDER — ALTEPLASE 2 MG IJ SOLR: 2.0000 mg | Freq: Once | INTRAMUSCULAR | Status: DC | PRN

## 2022-06-02 MED ORDER — CHLORHEXIDINE GLUCONATE CLOTH 2 % EX PADS
6.0000 | MEDICATED_PAD | Freq: Every day | CUTANEOUS | Status: DC
  Administered 2022-06-02 – 2022-06-03 (×2): 6 via TOPICAL

## 2022-06-02 MED ORDER — DARBEPOETIN ALFA 60 MCG/0.3ML IJ SOSY
60.0000 ug | PREFILLED_SYRINGE | INTRAMUSCULAR | Status: DC
Start: 2022-06-03 — End: 2022-06-05
  Administered 2022-06-03: 60 ug via INTRAVENOUS
  Filled 2022-06-02 (×2): qty 0.3

## 2022-06-02 MED ORDER — HEPARIN SODIUM (PORCINE) 1000 UNIT/ML DIALYSIS: 1000.0000 [IU] | INTRAMUSCULAR | Status: DC | PRN

## 2022-06-02 MED ORDER — PENTAFLUOROPROP-TETRAFLUOROETH EX AERO
1.0000 | INHALATION_SPRAY | CUTANEOUS | Status: DC | PRN
Start: 2022-06-02 — End: 2022-06-03

## 2022-06-02 MED ORDER — LIDOCAINE HCL (PF) 1 % IJ SOLN
5.0000 mL | INTRAMUSCULAR | Status: DC | PRN
Start: 2022-06-02 — End: 2022-06-03

## 2022-06-02 MED ORDER — LIDOCAINE-PRILOCAINE 2.5-2.5 % EX CREA
1.0000 | TOPICAL_CREAM | CUTANEOUS | Status: DC | PRN
Start: 2022-06-02 — End: 2022-06-03

## 2022-06-02 NOTE — Assessment & Plan Note (Signed)
Follow per renal  resolved

## 2022-06-02 NOTE — Assessment & Plan Note (Signed)
Therapy recommending [place]

## 2022-06-02 NOTE — Assessment & Plan Note (Signed)
synthroid °

## 2022-06-02 NOTE — Progress Notes (Signed)
       Subjective: CC: Patient reports no abdominal pain presently.  Had some soreness yesterday. Not eating much but tolerating well small amounts without nausea or vomiting.  Passing flatus.  No BM. Was restarted on home lactulose yesterday.  Objective: Vital signs in last 24 hours: Temp:  [97.7 F (36.5 C)-100 F (37.8 C)] 98.6 F (37 C) (07/10 0823) Pulse Rate:  [102-124] 103 (07/10 0823) Resp:  [13-26] 20 (07/10 0823) BP: (92-114)/(50-100) 109/69 (07/10 0823) SpO2:  [89 %-100 %] 98 % (07/10 0823) Weight:  [94.7 kg] 94.7 kg (07/10 0602) Last BM Date :  (PTA)  Intake/Output from previous day: 07/09 0701 - 07/10 0700 In: 986.7 [P.O.:580; I.V.:30; Blood:376.7] Out: 0  Intake/Output this shift: No intake/output data recorded.  PE: Gen:  Alert, NAD Card: irregular, tachy low 100-110s Pulm:  rate and effort normal Abd: protuberant, soft, very mild left flank tenderness, +BS Skin: no rashes noted, warm and dry  Lab Results:  Recent Labs    06/01/22 0210 06/01/22 0759 06/01/22 2147 06/02/22 0014  WBC 10.3  --   --  9.0  HGB 8.0*   < > 8.2* 8.2*  HCT 23.9*   < > 24.8* 25.1*  PLT 188  --   --  177   < > = values in this interval not displayed.   BMET Recent Labs    06/01/22 0210 06/02/22 0014  NA 133* 133*  K 4.5 4.7  CL 92* 89*  CO2 25 24  GLUCOSE 153* 154*  BUN 40* 58*  CREATININE 6.74* 7.77*  CALCIUM 8.8* 8.8*   PT/INR Recent Labs    05/30/22 1051  LABPROT 17.1*  INR 1.4*   CMP     Component Value Date/Time   NA 133 (L) 06/02/2022 0014   K 4.7 06/02/2022 0014   CL 89 (L) 06/02/2022 0014   CO2 24 06/02/2022 0014   GLUCOSE 154 (H) 06/02/2022 0014   BUN 58 (H) 06/02/2022 0014   CREATININE 7.77 (H) 06/02/2022 0014   CALCIUM 8.8 (L) 06/02/2022 0014   PROT 4.9 (L) 05/30/2022 1051   ALBUMIN 2.5 (L) 05/31/2022 0235   AST 12 (L) 05/30/2022 1051   ALT 9 05/30/2022 1051   ALKPHOS 70 05/30/2022 1051   BILITOT 0.6 05/30/2022 1051   GFRNONAA 7 (L)  06/02/2022 0014   GFRAA 7 (L) 10/26/2019 1626   Lipase     Component Value Date/Time   LIPASE 18 05/30/2022 1051    Studies/Results: No results found.  Anti-infectives: Anti-infectives (From admission, onward)    None        Assessment/Plan Fall  Left perinephric/subcapsular hematoma with possible active extravasation and hemorrhagic shock ABL anemia  - s/p IR left renal arteriogram and subselective embolization of two subsegmental branches of the left renal artery supplying two discrete areas of active extravasation 7/7 - s/p 3 units PRBCs 7/7 and 1U 7/9 - Hgb stable at 8.2 this am. HR stable in the low 100's, BP improved this am. Continue serial h/h checks. Bedrest per Urology.   Abrasions - local wound care   FEN - renal/CM diet VTE - SCDs only due to able anemia  ID - no current abx   ESRD on HD T/TR/Sat HTN COPD OSA CAD A. Fib Hypothyroidism T2DM Depression  PAD     LOS: 3 days    Jillyn Ledger , [facility] 06/02/2022, 8:52 AM Please see Amion for pager number during day hours 7:00am-4:30pm

## 2022-06-02 NOTE — Assessment & Plan Note (Signed)
lexapro

## 2022-06-02 NOTE — Assessment & Plan Note (Addendum)
Left perinephric hematoma: s/p percutaneous coil embolization of 2 tertiary branches of the anterior division of the L renal artery supplying 2 ill defined areas of contrast extravasation involving the inferior pole of the L kidney 7/7 by IR. No evidence of GI bleeding currently. - Urology (repeat Ct abd/pelvis with little if any change of the L perinephric hematoma, the AP dimension may have increased by 1.2 cm, improved L perinephric fluid, still with coarse asymmetric L perinephric stranding, new embolization coils in L renal hilum, mild pelvic ascites with improvement, hemorrhagic content along L pelvic sidewall has almost entirely cleared, increased small pleural effusions with increased interstitial edema in the lung bases, uremic cystic disease,) and trauma surgery (now signed off) following.  - hb stable, will resume aspirin at discharge.  He was not on anticoagulation prior to admission, just aspirin.

## 2022-06-02 NOTE — Assessment & Plan Note (Signed)
a1c 6.4 (this may have been after transfusion, unclear) BG's ok follow

## 2022-06-02 NOTE — Hospital Course (Signed)
Christian Dalton is [name] 81 y.o. male with [name] history of ESRD (TTS), PAD, CAD, atrial fibrillation, T2DM, COPD who presented to the ED on the evening of 7/7 feeling unwell, lightheaded, after [name] fall onto his left side earlier that day. Work up revealed Worsened anemia, hgb 7.5g/dl, and imaging revealed [name] large left perinephric/subcapsular hematoma. He was admitted to the ICU, given 3u PRBCs, and IR performed coil embolization of 2 tertiary branches of anterior division of left renal artery with active extravasation during arteriogram.

## 2022-06-02 NOTE — Evaluation (Signed)
Clinical/Bedside Swallow Evaluation Patient Details  Name: Christian Dalton MRN: [id number] Date of Birth: October 18, 1941  Today's Date: 06/02/2022 Time: SLP Start Time (ACUTE ONLY): 1438 SLP Stop Time (ACUTE ONLY): 1445 SLP Time Calculation (min) (ACUTE ONLY): 7 min  Past Medical History:  Past Medical History:  Diagnosis Date   Acute cholecystitis 08/17/2018   Anemia    Arthritis    Atrial fibrillation (HCC)    CAD (coronary artery disease)    Cholecystitis    COPD (chronic obstructive pulmonary disease) (HCC)    Depression    Diabetes mellitus without complication (HCC)    ESRD (end stage renal disease) on dialysis (HCC)    HOH (hard of hearing)    Hypertension    Hypothyroidism    OSA (obstructive sleep apnea)    does not wear cpap   Spinal stenosis    Wears dentures    wears top dentures   Wears glasses    Wears hearing aid in both ears    Past Surgical History:  Past Surgical History:  Procedure Laterality Date   ABDOMINAL AORTOGRAM N/A 01/15/2021   Procedure: ABDOMINAL AORTOGRAM;  Surgeon: Serafina Mitchell, MD;  Location: [facility];  Service: Cardiovascular;  Laterality: N/A;   ABDOMINAL AORTOGRAM W/LOWER EXTREMITY Bilateral 12/11/2020   Procedure: ABDOMINAL AORTOGRAM W/LOWER EXTREMITY;  Surgeon: Serafina Mitchell, MD;  Location: [facility];  Service: Cardiovascular;  Laterality: Bilateral;   ABDOMINAL AORTOGRAM W/LOWER EXTREMITY N/A 03/17/2022   Procedure: ABDOMINAL AORTOGRAM W/LOWER EXTREMITY;  Surgeon: Waynetta Sandy, MD;  Location: [facility];  Service: Cardiovascular;  Laterality: N/A;   BASCILIC VEIN TRANSPOSITION Left 12/02/2019   Procedure: BASCILIC VEIN TRANSPOSITION LEFT FIRST STAGE;  Surgeon: Serafina Mitchell, MD;  Location: [facility];  Service: Vascular;  Laterality: Left;   [place] Left 02/10/2020   Procedure: BASCILIC VEIN TRANSPOSITION LEFT SECOND STAGE;  Surgeon: Serafina Mitchell, MD;  Location: [facility];  Service:  Vascular;  Laterality: Left;   BILIARY STENT PLACEMENT  11/25/2018   Procedure: [place];  Surgeon: [name], MD;  Location: [facility];  Service: Gastroenterology;;   BIOPSY  12/20/2018   Procedure: BIOPSY;  Surgeon: [name], MD;  Location: [facility];  Service: Gastroenterology;;   BIOPSY  08/03/2020   Procedure: BIOPSY;  Surgeon: Otis Brace, MD;  Location: [facility];  Service: Gastroenterology;;  EGD and COLON   CARDIAC CATHETERIZATION     CHOLECYSTECTOMY N/A 11/14/2018   Procedure: LAPAROSCOPIC CHOLECYSTECTOMY WITH INTRAOPERATIVE CHOLANGIOGRAM;  Surgeon: Coralie Keens, MD;  Location: [facility];  Service: General;  Laterality: N/A;   COLONOSCOPY W/ BIOPSIES AND POLYPECTOMY     COLONOSCOPY WITH PROPOFOL N/A 08/03/2020   Procedure: COLONOSCOPY WITH PROPOFOL;  Surgeon: Otis Brace, MD;  Location: [facility];  Service: Gastroenterology;  Laterality: N/A;   ERCP N/A 11/25/2018   Procedure: ENDOSCOPIC RETROGRADE CHOLANGIOPANCREATOGRAPHY (ERCP);  Surgeon: [name], MD;  Location: [facility];  Service: Gastroenterology;  Laterality: N/A;   ESOPHAGOGASTRODUODENOSCOPY N/A 12/20/2018   Procedure: ESOPHAGOGASTRODUODENOSCOPY (EGD);  Surgeon: [name], MD;  Location: [facility];  Service: Gastroenterology;  Laterality: N/A;   ESOPHAGOGASTRODUODENOSCOPY (EGD) WITH PROPOFOL N/A 08/03/2020   Procedure: ESOPHAGOGASTRODUODENOSCOPY (EGD) WITH PROPOFOL;  Surgeon: Otis Brace, MD;  [facility]: [facility];  Service: Gastroenterology;  Laterality: N/A;   EYE SURGERY     cataract B/L   HAND SURGERY Right    IR ANGIOGRAM SELECTIVE EACH ADDITIONAL VESSEL  05/30/2022   IR ANGIOGRAM SELECTIVE EACH ADDITIONAL  VESSEL  05/30/2022   IR ANGIOGRAM SELECTIVE EACH ADDITIONAL VESSEL  05/30/2022   IR ANGIOGRAM SELECTIVE EACH ADDITIONAL VESSEL  05/30/2022   IR ANGIOGRAM SELECTIVE EACH ADDITIONAL VESSEL  05/30/2022   IR ANGIOGRAM SELECTIVE EACH ADDITIONAL VESSEL  05/30/2022   IR DIALY  SHUNT INTRO NEEDLE/INTRACATH INITIAL W/IMG LEFT Left 08/20/2018   IR EMBO ART  VEN HEMORR LYMPH EXTRAV  INC GUIDE ROADMAPPING  05/30/2022   IR PERC CHOLECYSTOSTOMY  08/19/2018   IR RADIOLOGIST EVAL & MGMT  09/29/2018   IR US GUIDE VASC ACCESS RIGHT  05/30/2022   LOWER EXTREMITY ANGIOGRAPHY Left 01/15/2021   Procedure: Lower Extremity Angiography;  Surgeon: Serafina Mitchell, MD;  Location: [facility];  Service: Cardiovascular;  Laterality: Left;   MULTIPLE TOOTH EXTRACTIONS     PERIPHERAL VASCULAR BALLOON ANGIOPLASTY Right 12/11/2020   Procedure: PERIPHERAL VASCULAR BALLOON ANGIOPLASTY;  Surgeon: Serafina Mitchell, MD;  Location: [facility];  Service: Cardiovascular;  Laterality: Right;   PERIPHERAL VASCULAR BALLOON ANGIOPLASTY Left 01/15/2021   Procedure: PERIPHERAL VASCULAR BALLOON ANGIOPLASTY;  Surgeon: Serafina Mitchell, MD;  Location: [facility];  Service: Cardiovascular;  Laterality: Left;  PT   POLYPECTOMY  08/03/2020   Procedure: POLYPECTOMY;  Surgeon: Otis Brace, MD;  Location[facility];  Service: Gastroenterology;;   RESECTION OF ARTERIOVENOUS FISTULA ANEURYSM Left 10/27/2019   Procedure: Resection Of Left Upper Arm  Arteriovenous Fistula Aneurysm Times Two;  Surgeon: Serafina Mitchell, MD;  Location: [facility];  Service: Vascular;  Laterality: Left;   SHOULDER SURGERY Right    SPHINCTEROTOMY  11/25/2018   Procedure: SPHINCTEROTOMY;  Surgeon: [name], MD;  Location: [facility];  Service: Gastroenterology;;   Lavell Islam REMOVAL  12/20/2018   Procedure: STENT REMOVAL;  Surgeon: [name], MD;  [facility];  Service: Gastroenterology;;   TIBIA FRACTURE SURGERY     HPI:  Christian Dalton is a 81 y.o. male with a history of ESRD (TTS), PAD, CAD, atrial fibrillation, T2DM, COPD who presented to the ED on the evening of 7/7 lightheaded, after a fal. Work up revealed Worsened anemia and imaging revealed a large left perinephric/subcapsular hematoma and underwent coil  embolization of 2 tertiary branches of anterior division of left renal artery with active extravasation during arteriogram.    Assessment / Plan / Recommendation  Clinical Impression  RN relayed night nurse reported difficulty swallowing although specifics unknown. Pt and family stated he had difficulty 2 days ago and his throat was sore. Had procedure in IR however unaware of intubation versus IV sedation (favoring IV). There were no indications of compromised airway with trials regular or straw sips water. Volitional cough strong and vocal quality remained clear. Continue regular diet, thin liquids, pills with water and no further ST is needed. SLP Visit Diagnosis: Dysphagia, unspecified (R13.10)    Aspiration Risk  No limitations    Diet Recommendation Regular;Thin liquid   Liquid Administration via: Cup;Straw Medication Administration: Whole meds with liquid Supervision: Patient able to self feed Postural Changes: Seated upright at 90 degrees    Other  Recommendations Oral Care Recommendations: Oral care BID    Recommendations for follow up therapy are one component of a multi-disciplinary discharge planning process, led by the attending physician.  Recommendations may be updated based on patient status, additional functional criteria and insurance authorization.  Follow up Recommendations No SLP follow up      Assistance Recommended at Discharge None  Functional Status Assessment    Frequency and  Duration            Prognosis        Swallow Study   General Date of Onset: 05/30/22 HPI: Yanis Freund is a 81 y.o. male with a history of ESRD (TTS), PAD, CAD, atrial fibrillation, T2DM, COPD who presented to the ED on the evening of 7/7 lightheaded, after a fal. Work up revealed Worsened anemia and imaging revealed a large left perinephric/subcapsular hematoma and underwent coil embolization of 2 tertiary branches of anterior division of left renal artery with active  extravasation during arteriogram. Type of Study: Bedside Swallow Evaluation Previous Swallow Assessment:  (none) Diet Prior to this Study: Regular;Thin liquids Temperature Spikes Noted: No Respiratory Status: Room air History of Recent Intubation: No Length of Intubations (days):  (for surgery) Date extubated: 05/30/22 Behavior/Cognition: Alert;Cooperative;Pleasant mood Oral Cavity Assessment: Within Functional Limits Oral Care Completed by SLP: No Oral Cavity - Dentition: Missing dentition (upper parital, has 3 lower teeth) Vision: Functional for self-feeding Self-Feeding Abilities: Able to feed self Patient Positioning: Upright in bed Baseline Vocal Quality: Normal Volitional Cough: Strong Volitional Swallow: Able to elicit    Oral/Motor/Sensory Function Overall Oral Motor/Sensory Function: Within functional limits   Ice Chips Ice chips: Not tested   Thin Liquid Thin Liquid: Within functional limits    Nectar Thick Nectar Thick Liquid: Not tested   Honey Thick Honey Thick Liquid: Not tested   Puree     Solid     Solid: Within functional limits      Houston Siren 06/02/2022,3:06 PM

## 2022-06-02 NOTE — Progress Notes (Addendum)
[facility] Progress Note   Subjective:    Seen and examined patient at bedside. Denies CP, SOB, N/V/D, and ABD pain. Plan for HD 7/11.  Objective Vitals:   06/02/22 0248 06/02/22 0430 06/02/22 0602 06/02/22 0823  BP: 106/65 102/68  109/69  Pulse: (!) 104 (!) 102  (!) 103  Resp: '20 16  20  '$ Temp: 98.7 F (37.1 C) 99.2 F (37.3 C)  98.6 F (37 C)  TempSrc: Oral Axillary  Oral  SpO2: 98% 100%  98%  Weight:   94.7 kg   Height:       Physical Exam General: Elderly male; NAD; On RA Heart: S1 and S2; No murmurs, gallops, rales Lungs: Clear throughout Abdomen: Soft and non-tender Extremities: No edema BLLE Dialysis Access: L AVF (+) B/T   Filed Weights   05/31/22 0748 05/31/22 1300 06/02/22 0602  Weight: 94.2 kg 94.2 kg 94.7 kg    Intake/Output Summary (Last 24 hours) at 06/02/2022 0907 Last data filed at 06/01/2022 1850 Gross per 24 hour  Intake 886.67 ml  Output 0 ml  Net 886.67 ml    Additional Objective Labs: Basic Metabolic Panel: Recent Labs  Lab 05/31/22 0235 05/31/22 1009 06/01/22 0210 06/02/22 0014  NA 135 137 133* 133*  K 5.5* 4.2 4.5 4.7  CL 93* 93* 92* 89*  CO2 '24 25 25 24  '$ GLUCOSE 175* 142* 153* 154*  BUN 52* 40* 40* 58*  CREATININE 9.01* 6.72* 6.74* 7.77*  CALCIUM 8.7* 8.8* 8.8* 8.8*  PHOS 7.4*  --   --   --    Liver Function Tests: Recent Labs  Lab 05/30/22 1051 05/31/22 0235  AST 12*  --   ALT 9  --   ALKPHOS 70  --   BILITOT 0.6  --   PROT 4.9*  --   ALBUMIN 2.7* 2.5*   Recent Labs  Lab 05/30/22 1051  LIPASE 18   CBC: Recent Labs  Lab 05/30/22 1051 05/30/22 1107 05/31/22 0054 06/01/22 0210 06/01/22 0759 06/01/22 1419 06/01/22 2147 06/02/22 0014  WBC 9.1  --  12.0* 10.3  --   --   --  9.0  NEUTROABS 7.8*  --   --   --   --   --   --   --   HGB 7.5*   < > 11.4* 8.0*   < > 7.9* 8.2* 8.2*  HCT 24.7*   < > 35.5* 23.9*   < > 24.5* 24.8* 25.1*  MCV 97.2  --  93.9 90.2  --   --   --  91.3  PLT 225  --  77*  188  --   --   --  177   < > = values in this interval not displayed.   Blood Culture    Component Value Date/Time   SDES BLOOD RIGHT ANTECUBITAL 05/30/2022 2303   SPECREQUEST  05/30/2022 2303    BOTTLES DRAWN AEROBIC AND ANAEROBIC Blood Culture adequate volume   CULT  05/30/2022 2303    NO GROWTH 3 DAYS Performed at [facility], [place] [address], [place], [place] [address]    REPTSTATUS PENDING 05/30/2022 2303    Cardiac Enzymes: No results for input(s): "CKTOTAL", "CKMB", "CKMBINDEX", "TROPONINI" in the last 168 hours. CBG: Recent Labs  Lab 06/01/22 1557 06/01/22 1916 06/02/22 0006 06/02/22 0416 06/02/22 0822  GLUCAP 138* 194* 130* 125* 97   Iron Studies: No results for input(s): "IRON", "TIBC", "TRANSFERRIN", "FERRITIN" in the last 72 hours.  Lab Results  Component Value Date   INR 1.4 (H) 05/30/2022   INR 1.07 11/23/2018   INR 1.22 08/19/2018   Studies/Results: No results found.  Medications:  sodium chloride Stopped (05/30/22 1717)   sodium chloride Stopped (05/30/22 1717)    sodium chloride   Intravenous Once   atorvastatin  10 mg Oral QHS   buPROPion  150 mg Oral QPM   escitalopram  10 mg Oral QHS   insulin aspart  0-6 Units Subcutaneous Q4H   lactulose  10 g Oral q1800   levothyroxine  125 mcg Oral Q0600   montelukast  10 mg Oral QPM   sodium zirconium cyclosilicate  10 g Oral Daily    Dialysis Orders: Unit: [facility] TTS Time: 3:45  EDW: 90.2 kg Flows: 425/800  [place]: 2K/2Ca  Access: AVF 15 g Heparin: None ESA: Mircera 30 q 4 wks (to start 7/11) VDRA: Calcitriol 2.25 q HD   Assessment/Plan:  Left kidney hematoma/ABLA - large perinephric/subscapular hematoma of left kidney after fall at home. IR consulted - s/p embolization 05/30/22. Per primary team  ESRD -  HD TTS. Does not complete full treatments as outpatient. K+ 5.2 prior to transfusion. Improved 4.7.  Continue with TTS schedule-HD 7/11.  Hypertension/volume  - Hypotensive on admission in  the setting of blood loss. Repletion with prbcs. HD if BP permits.  Acute blood loss anemia   - Hgb 7.5 on admission. S/p 4 units PRBSs during hospitalization. Hgb back down and now in 7-8s. Will start ESA with HD tomorrow. Transfuse if Hgb < 7. Atrial fib with RVR - per primary svc Metabolic bone disease -  Calcium acceptable. Continue home meds  Nutrition - Renal diet/fluid restriction when eating DM -per primary PAD - LLE ulcer - follows with Dr. Donzetta Matters CAD Thrombocytopenia - likely due to recent ABLA.  Christian Poet, NP [facility] 06/02/2022,9:07 AM  LOS: 3 days

## 2022-06-02 NOTE — Progress Notes (Addendum)
PROGRESS NOTE    Christian Dalton  SEG:[id number] DOB: 05-11-1941 DOA: 05/30/2022 PCP: Maris Berger, MD  No chief complaint on file.   Brief Narrative:  Christian Dalton is Christian Dalton 81 y.o. male with Christian Dalton history of ESRD (TTS), PAD, CAD, atrial fibrillation, T2DM, COPD who presented to the ED on the evening of 7/7 feeling unwell, lightheaded, after Christian Dalton fall onto his left side earlier that day. Work up revealed Worsened anemia, hgb 7.5g/dl, and imaging revealed Christian Dalton large left perinephric/subcapsular hematoma. He was admitted to the ICU, given 3u PRBCs, and IR performed coil embolization of 2 tertiary branches of anterior division of left renal artery with active extravasation during arteriogram.     Assessment & Plan:   Principal Problem:   Acute blood loss anemia Active Problems:   Traumatic perinephric hematoma of left kidney   Paroxysmal atrial fibrillation with RVR (HCC)   ESRD (end stage renal disease) on dialysis (HCC)   Hyperkalemia   HFrEF (heart failure with reduced ejection fraction) ([place])   Fall at home, initial encounter   Thrombocytopenia ([place])   Type II diabetes mellitus with renal manifestations ([place])   PAD (peripheral artery disease) (HCC)   Gangrene of toe of left foot (HCC)   Hypothyroidism   COPD (chronic obstructive pulmonary disease) ([place])   Depression   Assessment and Plan: * Acute blood loss anemia S/p 4 units pRBC (last unit 7/9 afternoon) Follow iron, b12, ferritin, folate Will continue to trend  Traumatic perinephric hematoma of left kidney Left perinephric hematoma: s/p percutaneous coil embolization of 2 tertiary branches of the anterior division of the L renal artery supplying 2 ill defined areas of contrast extravasation involving the inferior pole of the L kidney 7/7 by IR. No evidence of GI bleeding currently. - Urology (recommending bedrest, serial Hb/Hct monitoring) and trauma surgery (now signed off) following.  - Hold aspirin and anticoagulation.  Paroxysmal  atrial fibrillation with RVR (HCC) eliquis on hold Not on any AV nodal blocking agents Will continue to monitor for now, adjust as needed   ESRD (end stage renal disease) on dialysis (HCC) TTS dialysis per renal  HFrEF (heart failure with reduced ejection fraction) (HCC) EF 20-25%, undergoing workup outpatient with cards Had planned cath on 7/7, this will need to be rescheduled Volume per renal with dialysis    Hyperkalemia Follow per renal   Fall at home, initial encounter PT/OT when ok to mobilize per urology  Thrombocytopenia ([place]) resolved  Type II diabetes mellitus with renal manifestations ([place]) a1c 6.4 (this may have been after transfusion, unclear) BG's ok follow  Gangrene of toe of left foot ([place]) As above  PAD (peripheral artery disease) ([place]) Last saw Christian Dalton with vascular on 6/28 Had planned for angiogram after cardiac cath on 7/7 Holding aspirin for now  statin  COPD (chronic obstructive pulmonary disease) ([place]) No exacerbation  Hypothyroidism synthroid  Depression lexapro      DVT prophylaxis: SCD Code Status: full Family Communication: none at bedside Disposition:   Status is: Inpatient Remains inpatient appropriate because: pending further urology recs   Consultants:  PCCM IR Nephrology Urology Trauma surgery   Procedures:  IMPRESSION: Successful percutaneous coil embolization of 2 tertiary branches of the anterior division of the left renal artery supplying 2 ill-defined areas of contrast extravasation involving the inferior pole of the left kidney.  Antimicrobials:  Anti-infectives (From admission, onward)    None       Subjective: No new complaints  Objective: Vitals:  06/02/22 0602 06/02/22 0823 06/02/22 0900 06/02/22 1209  BP:  109/69  106/78  Pulse:  (!) 103 (!) 103 (!) 111  Resp:  '20 15 17  '$ Temp:  98.6 F (37 C)  98.3 F (36.8 C)  TempSrc:  Oral  Oral  SpO2:  98% 100% 100%  Weight: 94.7 kg      Height:        Intake/Output Summary (Last 24 hours) at 06/02/2022 1325 Last data filed at 06/02/2022 0900 Gross per 24 hour  Intake 766.67 ml  Output --  Net 766.67 ml   Filed Weights   05/31/22 0748 05/31/22 1300 06/02/22 0602  Weight: 94.2 kg 94.2 kg 94.7 kg    Examination:  General exam: Appears calm and comfortable  Respiratory system: unlabored Cardiovascular system: RRR Gastrointestinal system: Abdomen is nondistended, soft and nontender Central nervous system: Alert and oriented. No focal neurological deficits. Christian Dalton&O, but intermittently some mild confusion.  Extremities: no LEE, gangrene to L first toe, RLE with intact dressing - palpable distal pulses   Data Reviewed: I have personally reviewed following labs and imaging studies  CBC: Recent Labs  Lab 05/30/22 1051 05/30/22 1107 05/31/22 0054 06/01/22 0210 06/01/22 0759 06/01/22 1419 06/01/22 2147 06/02/22 0014 06/02/22 1031  WBC 9.1  --  12.0* 10.3  --   --   --  9.0 10.0  NEUTROABS 7.8*  --   --   --   --   --   --   --   --   HGB 7.5*   < > 11.4* 8.0* 8.0* 7.9* 8.2* 8.2* 8.9*  HCT 24.7*   < > 35.5* 23.9* 24.2* 24.5* 24.8* 25.1* 26.7*  MCV 97.2  --  93.9 90.2  --   --   --  91.3 90.5  PLT 225  --  77* 188  --   --   --  177 202   < > = values in this interval not displayed.    Basic Metabolic Panel: Recent Labs  Lab 05/30/22 1051 05/30/22 1107 05/31/22 0235 05/31/22 1009 06/01/22 0210 06/02/22 0014  NA 136 133* 135 137 133* 133*  K 5.3* 5.2* 5.5* 4.2 4.5 4.7  CL 94* 94* 93* 93* 92* 89*  CO2 20*  --  '24 25 25 24  '$ GLUCOSE 265* 255* 175* 142* 153* 154*  BUN 46* 41* 52* 40* 40* 58*  CREATININE 8.61* 9.00* 9.01* 6.72* 6.74* 7.77*  CALCIUM 8.7*  --  8.7* 8.8* 8.8* 8.8*  PHOS  --   --  7.4*  --   --   --     GFR: Estimated Creatinine Clearance: 8.9 mL/min (Christian Dalton) (by C-G formula based on SCr of 7.77 mg/dL (H)).  Liver Function Tests: Recent Labs  Lab 05/30/22 1051 05/31/22 0235  AST 12*  --    ALT 9  --   ALKPHOS 70  --   BILITOT 0.6  --   PROT 4.9*  --   ALBUMIN 2.7* 2.5*    CBG: Recent Labs  Lab 06/01/22 1916 06/02/22 0006 06/02/22 0416 06/02/22 0822 06/02/22 1207  GLUCAP 194* 130* 125* 97 104*     Recent Results (from the past 240 hour(s))  Blood Culture (routine x 2)     Status: None (Preliminary result)   Collection Time: 05/30/22 11:00 AM   Specimen: BLOOD  Result Value Ref Range Status   Specimen Description BLOOD SITE NOT SPECIFIED  Final   Special Requests   Final  BOTTLES DRAWN AEROBIC AND ANAEROBIC Blood Culture adequate volume   Culture   Final    NO GROWTH 3 DAYS Performed at [facility], [place] [address] [place], [place] [address]    Report Status PENDING  Incomplete  MRSA Next Gen by PCR, Nasal     Status: None   Collection Time: 05/30/22  1:32 PM   Specimen: Nasal Mucosa; Nasal Swab  Result Value Ref Range Status   MRSA by PCR Next Gen NOT DETECTED NOT DETECTED Final    Comment: (NOTE) The GeneXpert MRSA Assay (FDA approved for NASAL specimens only), is one component of [facility] comprehensive MRSA colonization surveillance program. It is not intended to diagnose MRSA infection nor to guide or monitor treatment for MRSA infections. Test performance is not FDA approved in patients less than 66 years old. Performed at [facility], [place] [address] [place], [place] [address]   Blood Culture (routine x 2)     Status: None (Preliminary result)   Collection Time: 05/30/22 11:03 PM   Specimen: BLOOD  Result Value Ref Range Status   Specimen Description BLOOD RIGHT ANTECUBITAL  Final   Special Requests   Final    BOTTLES DRAWN AEROBIC AND ANAEROBIC Blood Culture adequate volume   Culture   Final    NO GROWTH 3 DAYS Performed at [facility], [place] [address] [place], [place] [address]    Report Status PENDING  Incomplete         Radiology Studies: No results found.      Scheduled Meds:  sodium chloride    Intravenous Once   atorvastatin  10 mg Oral QHS   buPROPion  150 mg Oral QPM   Chlorhexidine Gluconate Cloth  6 each Topical Q0600   [START ON 06/03/2022] darbepoetin (ARANESP) injection - DIALYSIS  60 mcg Intravenous Q Tue-HD   escitalopram  10 mg Oral QHS   insulin aspart  0-6 Units Subcutaneous Q4H   lactulose  10 g Oral q1800   levothyroxine  125 mcg Oral Q0600   montelukast  10 mg Oral QPM   sodium zirconium cyclosilicate  10 g Oral Daily   Continuous Infusions:  sodium chloride Stopped (05/30/22 1717)   sodium chloride Stopped (05/30/22 1717)     LOS: 3 days    Time spent: over 30 min    Fayrene Helper, MD [facility]   To contact the attending provider between 7A-7P or the covering provider during after hours 7P-7A, please log into the web site [email or web address] and access using universal [place] password for that web site. If you do not have the password, please call the hospital operator.  06/02/2022, 1:25 PM

## 2022-06-02 NOTE — Assessment & Plan Note (Signed)
As above.

## 2022-06-02 NOTE — Assessment & Plan Note (Signed)
EF 20-25%, undergoing workup outpatient with cards Had planned cath on 7/7, this will need to be rescheduled Volume per renal with dialysis

## 2022-06-02 NOTE — Assessment & Plan Note (Addendum)
Last saw Christian Dalton with vascular on 6/28 Had planned for angiogram after cardiac cath on 7/7 Resume aspirin  Statin Needs to follow outpatient with vascular

## 2022-06-02 NOTE — Assessment & Plan Note (Signed)
resolved 

## 2022-06-02 NOTE — Progress Notes (Signed)
Patient c/o feeling SOB.  Respiratory rate 22, HR 104, satus 95% on RA, BP 111/70. Lungs clear but diminished.  Dr. Florene Glen notified.

## 2022-06-02 NOTE — Assessment & Plan Note (Signed)
TTS dialysis per renal

## 2022-06-02 NOTE — Assessment & Plan Note (Addendum)
S/p 4 units pRBC (last unit 7/9 afternoon) Labs notable for iron definiciency, folate deficiency - borderline B12 (MMA pending) Supplement iron, folate, and b12 (while awaiting MMA) Will continue to trend

## 2022-06-02 NOTE — Assessment & Plan Note (Signed)
No exacerbation ?

## 2022-06-02 NOTE — Assessment & Plan Note (Signed)
eliquis on hold Not on any AV nodal blocking agents Will continue to monitor for now, adjust as needed

## 2022-06-03 ENCOUNTER — Inpatient Hospital Stay (HOSPITAL_COMMUNITY): Payer: Medicare Other

## 2022-06-03 ENCOUNTER — Encounter (HOSPITAL_COMMUNITY): Payer: Self-pay

## 2022-06-03 ENCOUNTER — Telehealth: Payer: Self-pay

## 2022-06-03 DIAGNOSIS — I340 Nonrheumatic mitral (valve) insufficiency: Secondary | ICD-10-CM

## 2022-06-03 DIAGNOSIS — G9341 Metabolic encephalopathy: Secondary | ICD-10-CM

## 2022-06-03 DIAGNOSIS — R0602 Shortness of breath: Secondary | ICD-10-CM

## 2022-06-03 LAB — COMPREHENSIVE METABOLIC PANEL
ALT: 11 U/L (ref 0–44)
AST: 7 U/L — ABNORMAL LOW (ref 15–41)
Albumin: 2.5 g/dL — ABNORMAL LOW (ref 3.5–5.0)
Alkaline Phosphatase: 86 U/L (ref 38–126)
Anion gap: 16 — ABNORMAL HIGH (ref 5–15)
BUN: 79 mg/dL — ABNORMAL HIGH (ref 8–23)
CO2: 23 mmol/L (ref 22–32)
Calcium: 8.5 mg/dL — ABNORMAL LOW (ref 8.9–10.3)
Chloride: 92 mmol/L — ABNORMAL LOW (ref 98–111)
Creatinine, Ser: 8.89 mg/dL — ABNORMAL HIGH (ref 0.61–1.24)
GFR, Estimated: 6 mL/min — ABNORMAL LOW (ref >60)
Glucose, Bld: 98 mg/dL (ref 70–99)
Potassium: 5.1 mmol/L (ref 3.5–5.1)
Sodium: 131 mmol/L — ABNORMAL LOW (ref 135–145)
Total Bilirubin: 0.6 mg/dL (ref 0.3–1.2)
Total Protein: 5.2 g/dL — ABNORMAL LOW (ref 6.5–8.1)

## 2022-06-03 LAB — IRON AND TIBC
Iron: 23 ug/dL — ABNORMAL LOW (ref 45–182)
Saturation Ratios: 12 % — ABNORMAL LOW (ref 17.9–39.5)
TIBC: 197 ug/dL — ABNORMAL LOW (ref 250–450)
UIBC: 174 ug/dL

## 2022-06-03 LAB — GLUCOSE, CAPILLARY
Glucose-Capillary: 100 mg/dL — ABNORMAL HIGH (ref 70–99)
Glucose-Capillary: 106 mg/dL — ABNORMAL HIGH (ref 70–99)
Glucose-Capillary: 128 mg/dL — ABNORMAL HIGH (ref 70–99)
Glucose-Capillary: 147 mg/dL — ABNORMAL HIGH (ref 70–99)
Glucose-Capillary: 75 mg/dL (ref 70–99)
Glucose-Capillary: 93 mg/dL (ref 70–99)

## 2022-06-03 LAB — CBC WITH DIFFERENTIAL/PLATELET
Abs Immature Granulocytes: 0.04 10*3/uL (ref 0.00–0.07)
Basophils Absolute: 0 10*3/uL (ref 0.0–0.1)
Basophils Relative: 0 %
Eosinophils Absolute: 0.2 10*3/uL (ref 0.0–0.5)
Eosinophils Relative: 3 %
HCT: 25.4 % — ABNORMAL LOW (ref 39.0–52.0)
Hemoglobin: 8.2 g/dL — ABNORMAL LOW (ref 13.0–17.0)
Immature Granulocytes: 1 %
Lymphocytes Relative: 6 %
Lymphs Abs: 0.5 10*3/uL — ABNORMAL LOW (ref 0.7–4.0)
MCH: 30 pg (ref 26.0–34.0)
MCHC: 32.3 g/dL (ref 30.0–36.0)
MCV: 93 fL (ref 80.0–100.0)
Monocytes Absolute: 0.8 10*3/uL (ref 0.1–1.0)
Monocytes Relative: 10 %
Neutro Abs: 6.8 10*3/uL (ref 1.7–7.7)
Neutrophils Relative %: 80 %
Platelets: 210 10*3/uL (ref 150–400)
RBC: 2.73 MIL/uL — ABNORMAL LOW (ref 4.22–5.81)
RDW: 15.3 % (ref 11.5–15.5)
WBC: 8.4 10*3/uL (ref 4.0–10.5)
nRBC: 0.2 % (ref 0.0–0.2)

## 2022-06-03 LAB — VITAMIN B12: Vitamin B-12: 204 pg/mL (ref 180–914)

## 2022-06-03 LAB — FOLATE: Folate: 5.5 ng/mL — ABNORMAL LOW (ref >5.9)

## 2022-06-03 LAB — FERRITIN: Ferritin: 405 ng/mL — ABNORMAL HIGH (ref 24–336)

## 2022-06-03 LAB — PHOSPHORUS: Phosphorus: 7.4 mg/dL — ABNORMAL HIGH (ref 2.5–4.6)

## 2022-06-03 LAB — MAGNESIUM: Magnesium: 2.2 mg/dL (ref 1.7–2.4)

## 2022-06-03 MED ORDER — LIDOCAINE HCL (PF) 1 % IJ SOLN: 5.0000 mL | INTRAMUSCULAR | Status: DC | PRN

## 2022-06-03 MED ORDER — HEPARIN SODIUM (PORCINE) 1000 UNIT/ML DIALYSIS: 1000.0000 [IU] | INTRAMUSCULAR | Status: DC | PRN

## 2022-06-03 MED ORDER — PENTAFLUOROPROP-TETRAFLUOROETH EX AERO: 1.0000 | INHALATION_SPRAY | CUTANEOUS | Status: DC | PRN

## 2022-06-03 MED ORDER — ALTEPLASE 2 MG IJ SOLR: 2.0000 mg | Freq: Once | INTRAMUSCULAR | Status: DC | PRN

## 2022-06-03 MED ORDER — LIDOCAINE-PRILOCAINE 2.5-2.5 % EX CREA: 1.0000 | TOPICAL_CREAM | CUTANEOUS | Status: DC | PRN

## 2022-06-03 MED ORDER — VITAMIN B-12 1000 MCG PO TABS
1000.0000 ug | ORAL_TABLET | Freq: Every day | ORAL | Status: DC
  Administered 2022-06-03 – 2022-06-05 (×3): 1000 ug via ORAL
  Filled 2022-06-03 (×3): qty 1

## 2022-06-03 MED ORDER — FOLIC ACID 1 MG PO TABS
1.0000 mg | ORAL_TABLET | Freq: Every day | ORAL | Status: DC
  Administered 2022-06-03 – 2022-06-05 (×3): 1 mg via ORAL
  Filled 2022-06-03 (×3): qty 1

## 2022-06-03 MED ORDER — ANTICOAGULANT SODIUM CITRATE 4% (200MG/5ML) IV SOLN: 5.0000 mL | Status: DC | PRN

## 2022-06-03 MED ORDER — FERROUS SULFATE 325 (65 FE) MG PO TABS
325.0000 mg | ORAL_TABLET | ORAL | Status: DC
  Administered 2022-06-03 – 2022-06-05 (×2): 325 mg via ORAL
  Filled 2022-06-03 (×2): qty 1

## 2022-06-03 NOTE — Progress Notes (Signed)
PT Cancellation Note  Patient Details Name: Christian Dalton MRN: [id number] DOB: January 13, 1941  Cancelled Treatment: Reason Eval/Treat Not Completed: Attempted to see pt twice for PT evaluation, however pt off unit for HD, will follow up and evaluate as able.   Becky Sax PT, DPT  06/03/2022, 1:29 PM

## 2022-06-03 NOTE — Assessment & Plan Note (Addendum)
Noted 7/10, no complaints today CXR 7/10 with bilateral interstitial thickening with cardiomegaly concerning for mild CHF vs infection Volume per renal, no signs of infection at this time See below

## 2022-06-03 NOTE — Progress Notes (Signed)
[facility] Progress Note   Subjective:    Seen and examined patient on HD. No acute complaints. Tolerating treatment.  Objective Vitals:   06/03/22 1200 06/03/22 1230 06/03/22 1249 06/03/22 1259  BP: 107/68 113/69 113/64   Pulse: (!) 101 (!) 101 (!) 102   Resp: '15 13 14   '$ Temp:      TempSrc:      SpO2:  100% 100%   Weight:    89.9 kg  Height:       Physical Exam General: Elderly male; NAD; On RA Heart: S1 and S2; No murmurs, gallops, rales Lungs: Clear throughout Abdomen: Soft and non-tender Extremities: No edema BLLE Dialysis Access: L AVF (+) B/T   Filed Weights   06/03/22 0402 06/03/22 0822 06/03/22 1259  Weight: 94.8 kg 93.1 kg 89.9 kg    Intake/Output Summary (Last 24 hours) at 06/03/2022 1310 Last data filed at 06/03/2022 1249 Gross per 24 hour  Intake 120 ml  Output -2959 ml  Net 3079 ml    Additional Objective Labs: Basic Metabolic Panel: Recent Labs  Lab 05/31/22 0235 05/31/22 1009 06/01/22 0210 06/02/22 0014 06/03/22 0409  NA 135   < > 133* 133* 131*  K 5.5*   < > 4.5 4.7 5.1  CL 93*   < > 92* 89* 92*  CO2 24   < > '25 24 23  '$ GLUCOSE 175*   < > 153* 154* 98  BUN 52*   < > 40* 58* 79*  CREATININE 9.01*   < > 6.74* 7.77* 8.89*  CALCIUM 8.7*   < > 8.8* 8.8* 8.5*  PHOS 7.4*  --   --   --  7.4*   < > = values in this interval not displayed.   Liver Function Tests: Recent Labs  Lab 05/30/22 1051 05/31/22 0235 06/03/22 0409  AST 12*  --  7*  ALT 9  --  11  ALKPHOS 70  --  86  BILITOT 0.6  --  0.6  PROT 4.9*  --  5.2*  ALBUMIN 2.7* 2.5* 2.5*   Recent Labs  Lab 05/30/22 1051  LIPASE 18   CBC: Recent Labs  Lab 05/30/22 1051 05/30/22 1107 05/31/22 0054 06/01/22 0210 06/01/22 0759 06/02/22 0014 06/02/22 1031 06/03/22 0409  WBC 9.1  --  12.0* 10.3  --  9.0 10.0 8.4  NEUTROABS 7.8*  --   --   --   --   --   --  6.8  HGB 7.5*   < > 11.4* 8.0*   < > 8.2* 8.9* 8.2*  HCT 24.7*   < > 35.5* 23.9*   < > 25.1* 26.7* 25.4*   MCV 97.2  --  93.9 90.2  --  91.3 90.5 93.0  PLT 225  --  77* 188  --  177 202 210   < > = values in this interval not displayed.   Blood Culture    Component Value Date/Time   SDES BLOOD RIGHT ANTECUBITAL 05/30/2022 2303   SPECREQUEST  05/30/2022 2303    BOTTLES DRAWN AEROBIC AND ANAEROBIC Blood Culture adequate volume   CULT  05/30/2022 2303    NO GROWTH 4 DAYS Performed at [facility], [place] [address] [place], [place] [address]    REPTSTATUS PENDING 05/30/2022 2303    Cardiac Enzymes: No results for input(s): "CKTOTAL", "CKMB", "CKMBINDEX", "TROPONINI" in the last 168 hours. CBG: Recent Labs  Lab 06/02/22 1540 06/02/22 2000 06/02/22 2357 06/03/22 0401 06/03/22  0820  GLUCAP 110* 141* 128* 106* 93   Iron Studies:  Recent Labs    06/03/22 0409  IRON 23*  TIBC 197*  FERRITIN 405*   Lab Results  Component Value Date   INR 1.4 (H) 05/30/2022   INR 1.07 11/23/2018   INR 1.22 08/19/2018   Studies/Results: DG CHEST PORT 1 VIEW  Result Date: 06/02/2022 CLINICAL DATA:  Acute on chronic shortness of breath EXAM: PORTABLE CHEST 1 VIEW COMPARISON:  05/30/2022 FINDINGS: Bilateral mild interstitial thickening. No focal consolidation. No pleural effusion or pneumothorax. Stable cardiomegaly. No acute osseous abnormality. IMPRESSION: 1. Bilateral interstitial thickening with cardiomegaly concerning for mild CHF versus infection. Electronically Signed   By: Kathreen Devoid M.D.   On: 06/02/2022 14:43    Medications:  sodium chloride Stopped (05/30/22 1717)   sodium chloride Stopped (05/30/22 1717)   anticoagulant sodium citrate      sodium chloride   Intravenous Once   atorvastatin  10 mg Oral QHS   buPROPion  150 mg Oral QPM   Chlorhexidine Gluconate Cloth  6 each Topical Q0600   darbepoetin (ARANESP) injection - DIALYSIS  60 mcg Intravenous Q Tue-HD   escitalopram  10 mg Oral QHS   ferrous sulfate  325 mg Oral QODAY   folic acid  1 mg Oral Daily   insulin aspart   0-6 Units Subcutaneous Q4H   lactulose  10 g Oral q1800   levothyroxine  125 mcg Oral Q0600   montelukast  10 mg Oral QPM   sodium zirconium cyclosilicate  10 g Oral Daily    Dialysis Orders: Unit: [facility] TTS Time: 3:45  EDW: 90.2 kg Flows: 425/800  [place]: 2K/2Ca  Access: AVF 15 g Heparin: None ESA: Mircera 30 q 4 wks (to start 7/11) VDRA: Calcitriol 2.25 q HD   Assessment/Plan: Left kidney hematoma/ABLA - large perinephric/subscapular hematoma of left kidney after fall at home. IR consulted - s/p embolization 05/30/22. Per primary team  ESRD -  HD TTS. Does not complete full treatments as outpatient. K+ 5.2 prior to transfusion, then 4.7, now at 5.1. On HD. Monitor trend closely. May need to resume Lokelma if hyperkalemia persist. Hypertension/volume  - Hypotensive on admission in the setting of blood loss. Repletion with prbcs. HD if BP permits.  Acute blood loss anemia   - Hgb 7.5 on admission. S/p 4 units PRBSs during hospitalization. Hgb back down and now in 8.2. ESA given with HD today. Transfuse if Hgb < 7. Atrial fib with RVR - per primary svc Metabolic bone disease -  Calcium acceptable. Continue home meds  Nutrition - Renal diet/fluid restriction when eating DM -per primary PAD - LLE ulcer - follows with Dr. Donzetta Matters CAD Thrombocytopenia - likely due to recent ABLA.  Christian Poet, NP [facility] 06/03/2022,1:10 PM  LOS: 4 days

## 2022-06-03 NOTE — Assessment & Plan Note (Signed)
resolved Delirium precautions Will continue to monitor

## 2022-06-03 NOTE — Assessment & Plan Note (Signed)
Per care everywhere, moderate to severe mitral regurgitation

## 2022-06-03 NOTE — Telephone Encounter (Signed)
Pt's wife Reita May called to cancel aortogram scheduled for 06/09/22 d/t pt being inpatient. Spoke with Dr. Donzetta Matters, advised that we not cancel the surgery today and speak about it on Friday to decided whether to reschedule. Pt's wife notified and agreed with this plan.

## 2022-06-03 NOTE — Progress Notes (Signed)
Patient ID: Moody Robben, male   DOB: Jan 09, 1941, 81 y.o.   MRN: [id number]    Subjective: Pt denies pain this morning.  Feeling better.  Has been ambulating.  Denies hematuria.  Objective: Vital signs in last 24 hours: Temp:  [98.3 F (36.8 C)-99.5 F (37.5 C)] 98.5 F (36.9 C) (07/11 0402) Pulse Rate:  [102-111] 102 (07/11 0402) Resp:  [14-22] 17 (07/11 0402) BP: (103-111)/(62-78) 108/76 (07/11 0402) SpO2:  [94 %-100 %] 100 % (07/11 0402) Weight:  [94.8 kg] 94.8 kg (07/11 0402)  Intake/Output from previous day: 07/10 0701 - 07/11 0700 In: 360 [P.O.:360] Out: 125 [Urine:125] Intake/Output this shift: No intake/output data recorded.  Physical Exam:  General: Alert and oriented Abd: Soft, NT, No CVAT, No ecchymosis  Lab Results: Recent Labs    06/02/22 0014 06/02/22 1031 06/03/22 0409  HGB 8.2* 8.9* 8.2*  HCT 25.1* 26.7* 25.4*   BMET Recent Labs    06/02/22 0014 06/03/22 0409  NA 133* 131*  K 4.7 5.1  CL 89* 92*  CO2 24 23  GLUCOSE 154* 98  BUN 58* 79*  CREATININE 7.77* 8.89*  CALCIUM 8.8* 8.5*     Studies/Results:  Assessment/Plan: 1) Left perinephric hematoma secondary to blunt trauma (fall): Hemodynamically stable.  Now ambulating.  Question now when he can resume his chronic anticoagulation. I spoke with Dr. Pascal Lux (IR) who performed his emobolization on Friday.  He felt good about the success of the embolization.  He recommended a non-contrast CT of the abdomen and pelvis to establish baseline imaging and then to restart anticoagulation.  That way, if there is concern of bleeding, there will be a baseline imaging study pre-anticoagulation for comparison.  Please feel free to restart anticoagulation after his CT has been done.  We will follow.   LOS: 4 days   Dutch Gray 06/03/2022, 7:45 AM

## 2022-06-03 NOTE — Progress Notes (Signed)
Received patient in bed, alert and oriented to self and situation. Informed consent signed and in chart.  Time tx completed: 1300  HD treatment completed. Patient tolerated well. LAVF Fistula without signs and symptoms of complications. Patient transported back to the room, alert and orient and in no acute distress. Report given to bedside RN.  Total UF removed:  3L  Medication given:  Aranesp 60 mcg @ 1140  Post HD VS:  BP 113/64 HR 102 Resp 14 Spo2 100%  Temp 98.7   Post HD weight: 89.9lg

## 2022-06-03 NOTE — Progress Notes (Signed)
OT Cancellation Note  Patient Details Name: Christian Dalton MRN: [id number] DOB: 02/06/1941   Cancelled Treatment:    Reason Eval/Treat Not Completed: Patient at procedure or test/ unavailable. Pt in HD, will follow and see as able.   Jolaine Artist, OT Acute Rehabilitation Services Office [place] 06/03/2022, 10:54 AM

## 2022-06-03 NOTE — Progress Notes (Signed)
PT Cancellation Note  Patient Details Name: Christian Dalton MRN: [id number] DOB: Oct 04, 1941   Cancelled Treatment:    Reason Eval/Treat Not Completed: Fatigue limiting ability to participate. Pt reports too tired after HD. Will re-attempt tomorrow.   Shary Decamp [facility] 06/03/2022, 3:17 PM [place] Office [phone]

## 2022-06-03 NOTE — Progress Notes (Addendum)
PROGRESS NOTE    Christian Dalton  WVP:[id number] DOB: 1941/08/10 DOA: 05/30/2022 PCP: Maris Berger, MD  No chief complaint on file.   Brief Narrative:  Christian Dalton is Christian Dalton 81 y.o. male with Christian Dalton history of ESRD (TTS), PAD, CAD, atrial fibrillation, T2DM, COPD who presented to the ED on the evening of 7/7 feeling unwell, lightheaded, after Christian Dalton fall onto his left side earlier that day. Work up revealed Worsened anemia, hgb 7.5g/dl, and imaging revealed Christian Dalton large left perinephric/subcapsular hematoma. He was admitted to the ICU, given 3u PRBCs, and IR performed coil embolization of 2 tertiary branches of anterior division of left renal artery with active extravasation during arteriogram.     Assessment & Plan:   Principal Problem:   Acute blood loss anemia Active Problems:   Traumatic perinephric hematoma of left kidney   Paroxysmal atrial fibrillation with RVR (HCC)   ESRD (end stage renal disease) on dialysis (HCC)   Acute metabolic encephalopathy   Shortness of breath   Hyperkalemia   HFrEF (heart failure with reduced ejection fraction) (HCC)   Mitral regurgitation   Fall at home, initial encounter   Thrombocytopenia ([place])   Type II diabetes mellitus with renal manifestations ([place])   PAD (peripheral artery disease) (HCC)   Gangrene of toe of left foot (HCC)   Hypothyroidism   COPD (chronic obstructive pulmonary disease) ([place])   Depression   Assessment and Plan: * Acute blood loss anemia S/p 4 units pRBC (last unit 7/9 afternoon) Labs notable for iron definiciency, folate deficiency - borderline B12 (MMA pending) Supplement iron, folate, and b12 (while awaiting MMA) Will continue to trend  Traumatic perinephric hematoma of left kidney Left perinephric hematoma: s/p percutaneous coil embolization of 2 tertiary branches of the anterior division of the L renal artery supplying 2 ill defined areas of contrast extravasation involving the inferior pole of the L kidney 7/7 by IR. No evidence  of GI bleeding currently. - Urology (discussed with IR who noted ok to restart anticoagulation after repeat non contrast CT abdomen/pelvis - pending) and trauma surgery (now signed off) following.  - Hold aspirin and anticoagulation for now  Paroxysmal atrial fibrillation with RVR ([place]) eliquis on hold Not on any AV nodal blocking agents Will continue to monitor for now, adjust as needed   Shortness of breath Noted 7/10, no complaints today CXR 7/10 with bilateral interstitial thickening with cardiomegaly concerning for mild CHF vs infection Volume per renal, no signs of infection at this time See below   Acute metabolic encephalopathy Wife notes sometimes this happens after dialysis Seemed mostly sleepy when I evaluated him during dialysis today Delirium precautions Will continue to monitor  ESRD (end stage renal disease) on dialysis ([place]) TTS dialysis per renal  Mitral regurgitation Per care everywhere, moderate to severe mitral regurgitation  HFrEF (heart failure with reduced ejection fraction) ([place]) EF 20-25%, undergoing workup outpatient with cards Had planned cath on 7/7, this will need to be rescheduled - may need to reach out to [facility]? Volume per renal with dialysis    Hyperkalemia Follow per renal  lokelma  Fall at home, initial encounter PT/OT when ok to mobilize per urology  Thrombocytopenia ([place]) resolved  Type II diabetes mellitus with renal manifestations ([place]) a1c 6.4 (this may have been after transfusion, unclear) BG's ok follow  Gangrene of toe of left foot ([place]) As above  PAD (peripheral artery disease) ([place]) Last saw Christian Dalton with vascular on 6/28 Had planned for angiogram after cardiac cath  on 7/7 Holding aspirin for now  statin  COPD (chronic obstructive pulmonary disease) (HCC) No exacerbation  Hypothyroidism synthroid  Depression lexapro      DVT prophylaxis: SCD Code Status: full Family Communication: none at  bedside Disposition:   Status is: Inpatient Remains inpatient appropriate because: pending further urology recs   Consultants:  PCCM IR Nephrology Urology Trauma surgery   Procedures:  IMPRESSION: Successful percutaneous coil embolization of 2 tertiary branches of the anterior division of the left renal artery supplying 2 ill-defined areas of contrast extravasation involving the inferior pole of the left kidney.  Antimicrobials:  Anti-infectives (From admission, onward)    None       Subjective: No new complaints  Objective: Vitals:   06/03/22 1230 06/03/22 1249 06/03/22 1259 06/03/22 1432  BP: 113/69 113/64  112/75  Pulse: (!) 101 (!) 102  (!) 102  Resp: '13 14  17  '$ Temp:    98.7 F (37.1 C)  TempSrc:    Axillary  SpO2: 100% 100%  94%  Weight:   89.9 kg   Height:        Intake/Output Summary (Last 24 hours) at 06/03/2022 1830 Last data filed at 06/03/2022 1249 Gross per 24 hour  Intake --  Output -3084 ml  Net 3084 ml   Filed Weights   06/03/22 0402 06/03/22 0822 06/03/22 1259  Weight: 94.8 kg 93.1 kg 89.9 kg    Examination:  General exam: Appears calm and comfortable  Respiratory system: unlabored Cardiovascular system: RRR Gastrointestinal system: Abdomen is nondistended, soft and nontender Central nervous system: Alert and oriented. No focal neurological deficits. Christian Dalton&O, but intermittently some mild confusion.  Extremities: no LEE, gangrene to L first toe, RLE with intact dressing - palpable distal pulses   Data Reviewed: I have personally reviewed following labs and imaging studies  CBC: Recent Labs  Lab 05/30/22 1051 05/30/22 1107 05/31/22 0054 06/01/22 0210 06/01/22 0759 06/01/22 1419 06/01/22 2147 06/02/22 0014 06/02/22 1031 06/03/22 0409  WBC 9.1  --  12.0* 10.3  --   --   --  9.0 10.0 8.4  NEUTROABS 7.8*  --   --   --   --   --   --   --   --  6.8  HGB 7.5*   < > 11.4* 8.0*   < > 7.9* 8.2* 8.2* 8.9* 8.2*  HCT 24.7*   < > 35.5*  23.9*   < > 24.5* 24.8* 25.1* 26.7* 25.4*  MCV 97.2  --  93.9 90.2  --   --   --  91.3 90.5 93.0  PLT 225  --  77* 188  --   --   --  177 202 210   < > = values in this interval not displayed.    Basic Metabolic Panel: Recent Labs  Lab 05/31/22 0235 05/31/22 1009 06/01/22 0210 06/02/22 0014 06/03/22 0409  NA 135 137 133* 133* 131*  K 5.5* 4.2 4.5 4.7 5.1  CL 93* 93* 92* 89* 92*  CO2 '24 25 25 24 23  '$ GLUCOSE 175* 142* 153* 154* 98  BUN 52* 40* 40* 58* 79*  CREATININE 9.01* 6.72* 6.74* 7.77* 8.89*  CALCIUM 8.7* 8.8* 8.8* 8.8* 8.5*  MG  --   --   --   --  2.2  PHOS 7.4*  --   --   --  7.4*    GFR: Estimated Creatinine Clearance: 7.6 mL/min (Shadi Larner) (by C-G formula based on SCr of 8.89 mg/dL (H)).  Liver Function Tests: Recent Labs  Lab 05/30/22 1051 05/31/22 0235 06/03/22 0409  AST 12*  --  7*  ALT 9  --  11  ALKPHOS 70  --  86  BILITOT 0.6  --  0.6  PROT 4.9*  --  5.2*  ALBUMIN 2.7* 2.5* 2.5*    CBG: Recent Labs  Lab 06/02/22 2357 06/03/22 0401 06/03/22 0820 06/03/22 1435 06/03/22 1705  GLUCAP 128* 106* 93 75 100*     Recent Results (from the past 240 hour(s))  Blood Culture (routine x 2)     Status: None (Preliminary result)   Collection Time: 05/30/22 11:00 AM   Specimen: BLOOD  Result Value Ref Range Status   Specimen Description BLOOD SITE NOT SPECIFIED  Final   Special Requests   Final    BOTTLES DRAWN AEROBIC AND ANAEROBIC Blood Culture adequate volume   Culture   Final    NO GROWTH 4 DAYS Performed at [facility], [address], [place], [place] [address]    Report Status PENDING  Incomplete  MRSA Next Gen by PCR, Nasal     Status: None   Collection Time: 05/30/22  1:32 PM   Specimen: Nasal Mucosa; Nasal Swab  Result Value Ref Range Status   MRSA by PCR Next Gen NOT DETECTED NOT DETECTED Final    Comment: (NOTE) The GeneXpert MRSA Assay (FDA approved for NASAL specimens only), is one component of [facility] comprehensive MRSA colonization  surveillance program. It is not intended to diagnose MRSA infection nor to guide or monitor treatment for MRSA infections. Test performance is not FDA approved in patients less than 66 years old. Performed at [facility], [place] [address], [place], [place] [address]   Blood Culture (routine x 2)     Status: None (Preliminary result)   Collection Time: 05/30/22 11:03 PM   Specimen: BLOOD  Result Value Ref Range Status   Specimen Description BLOOD RIGHT ANTECUBITAL  Final   Special Requests   Final    BOTTLES DRAWN AEROBIC AND ANAEROBIC Blood Culture adequate volume   Culture   Final    NO GROWTH 4 DAYS Performed at [facility], [place] [address] [place], [place] [address]    Report Status PENDING  Incomplete         Radiology Studies: DG CHEST PORT 1 VIEW  Result Date: 06/02/2022 CLINICAL DATA:  Acute on chronic shortness of breath EXAM: PORTABLE CHEST 1 VIEW COMPARISON:  05/30/2022 FINDINGS: Bilateral mild interstitial thickening. No focal consolidation. No pleural effusion or pneumothorax. Stable cardiomegaly. No acute osseous abnormality. IMPRESSION: 1. Bilateral interstitial thickening with cardiomegaly concerning for mild CHF versus infection. Electronically Signed   By: Kathreen Devoid M.D.   On: 06/02/2022 14:43        Scheduled Meds:  sodium chloride   Intravenous Once   atorvastatin  10 mg Oral QHS   buPROPion  150 mg Oral QPM   Chlorhexidine Gluconate Cloth  6 each Topical Q0600   darbepoetin (ARANESP) injection - DIALYSIS  60 mcg Intravenous Q Tue-HD   escitalopram  10 mg Oral QHS   ferrous sulfate  325 mg Oral QODAY   folic acid  1 mg Oral Daily   insulin aspart  0-6 Units Subcutaneous Q4H   lactulose  10 g Oral q1800   levothyroxine  125 mcg Oral Q0600   montelukast  10 mg Oral QPM   sodium zirconium cyclosilicate  10 g Oral Daily  vitamin B-12  1,000 mcg Oral Daily   Continuous Infusions:  sodium chloride Stopped (05/30/22 1717)   sodium  chloride Stopped (05/30/22 1717)     LOS: 4 days    Time spent: over 30 min    Fayrene Helper, MD [facility]   To contact the attending provider between 7A-7P or the covering provider during after hours 7P-7A, please log into the web site [email or web address] and access using universal [place] password for that web site. If you do not have the password, please call the hospital operator.  06/03/2022, 6:30 PM

## 2022-06-03 NOTE — Care Management Important Message (Signed)
Important Message  Patient Details  Name: Christian Dalton MRN: [id number] Date of Birth: October 28, 1941   Medicare Important Message Given:  Yes     [name] 06/03/2022, 3:54 PM

## 2022-06-04 LAB — GLUCOSE, CAPILLARY
Glucose-Capillary: 110 mg/dL — ABNORMAL HIGH (ref 70–99)
Glucose-Capillary: 112 mg/dL — ABNORMAL HIGH (ref 70–99)
Glucose-Capillary: 121 mg/dL — ABNORMAL HIGH (ref 70–99)
Glucose-Capillary: 121 mg/dL — ABNORMAL HIGH (ref 70–99)
Glucose-Capillary: 161 mg/dL — ABNORMAL HIGH (ref 70–99)
Glucose-Capillary: 165 mg/dL — ABNORMAL HIGH (ref 70–99)

## 2022-06-04 LAB — COMPREHENSIVE METABOLIC PANEL
ALT: 13 U/L (ref 0–44)
AST: 10 U/L — ABNORMAL LOW (ref 15–41)
Albumin: 2.4 g/dL — ABNORMAL LOW (ref 3.5–5.0)
Alkaline Phosphatase: 109 U/L (ref 38–126)
Anion gap: 14 (ref 5–15)
BUN: 47 mg/dL — ABNORMAL HIGH (ref 8–23)
CO2: 26 mmol/L (ref 22–32)
Calcium: 8.4 mg/dL — ABNORMAL LOW (ref 8.9–10.3)
Chloride: 92 mmol/L — ABNORMAL LOW (ref 98–111)
Creatinine, Ser: 6.16 mg/dL — ABNORMAL HIGH (ref 0.61–1.24)
GFR, Estimated: 9 mL/min — ABNORMAL LOW (ref >60)
Glucose, Bld: 102 mg/dL — ABNORMAL HIGH (ref 70–99)
Potassium: 4.2 mmol/L (ref 3.5–5.1)
Sodium: 132 mmol/L — ABNORMAL LOW (ref 135–145)
Total Bilirubin: 0.8 mg/dL (ref 0.3–1.2)
Total Protein: 5.1 g/dL — ABNORMAL LOW (ref 6.5–8.1)

## 2022-06-04 LAB — CBC WITH DIFFERENTIAL/PLATELET
Abs Immature Granulocytes: 0.05 10*3/uL (ref 0.00–0.07)
Basophils Absolute: 0 10*3/uL (ref 0.0–0.1)
Basophils Relative: 0 %
Eosinophils Absolute: 0.2 10*3/uL (ref 0.0–0.5)
Eosinophils Relative: 3 %
HCT: 24.1 % — ABNORMAL LOW (ref 39.0–52.0)
Hemoglobin: 8 g/dL — ABNORMAL LOW (ref 13.0–17.0)
Immature Granulocytes: 1 %
Lymphocytes Relative: 7 %
Lymphs Abs: 0.5 10*3/uL — ABNORMAL LOW (ref 0.7–4.0)
MCH: 30.2 pg (ref 26.0–34.0)
MCHC: 33.2 g/dL (ref 30.0–36.0)
MCV: 90.9 fL (ref 80.0–100.0)
Monocytes Absolute: 0.7 10*3/uL (ref 0.1–1.0)
Monocytes Relative: 11 %
Neutro Abs: 5.4 10*3/uL (ref 1.7–7.7)
Neutrophils Relative %: 78 %
Platelets: 203 10*3/uL (ref 150–400)
RBC: 2.65 MIL/uL — ABNORMAL LOW (ref 4.22–5.81)
RDW: 14.9 % (ref 11.5–15.5)
WBC: 6.9 10*3/uL (ref 4.0–10.5)
nRBC: 0 % (ref 0.0–0.2)

## 2022-06-04 LAB — CULTURE, BLOOD (ROUTINE X 2)
Culture: NO GROWTH
Culture: NO GROWTH
Special Requests: ADEQUATE
Special Requests: ADEQUATE

## 2022-06-04 LAB — MAGNESIUM: Magnesium: 2 mg/dL (ref 1.7–2.4)

## 2022-06-04 LAB — PHOSPHORUS: Phosphorus: 5.2 mg/dL — ABNORMAL HIGH (ref 2.5–4.6)

## 2022-06-04 MED ORDER — ALTEPLASE 2 MG IJ SOLR: 2.0000 mg | Freq: Once | INTRAMUSCULAR | Status: DC | PRN

## 2022-06-04 MED ORDER — LIDOCAINE-PRILOCAINE 2.5-2.5 % EX CREA: 1.0000 | TOPICAL_CREAM | CUTANEOUS | Status: DC | PRN

## 2022-06-04 MED ORDER — HEPARIN SODIUM (PORCINE) 1000 UNIT/ML DIALYSIS: 1000.0000 [IU] | INTRAMUSCULAR | Status: DC | PRN

## 2022-06-04 MED ORDER — LIDOCAINE HCL (PF) 1 % IJ SOLN: 5.0000 mL | INTRAMUSCULAR | Status: DC | PRN

## 2022-06-04 MED ORDER — PENTAFLUOROPROP-TETRAFLUOROETH EX AERO: 1.0000 | INHALATION_SPRAY | CUTANEOUS | Status: DC | PRN

## 2022-06-04 NOTE — Progress Notes (Signed)
Patient ID: Christian Dalton, male   DOB: [birth year], 81 y.o.   MRN: [id number]    Subjective: Pt denies pain this morning.  Ambulating, hgb slight downtrend but overall stable. Interval CT with stable hematoma size  Objective: Vital signs in last 24 hours: Temp:  [97.8 F (36.6 C)-98.9 F (37.2 C)] 98.6 F (37 C) (07/12 0500) Pulse Rate:  [99-104] 100 (07/12 0500) Resp:  [13-24] 20 (07/12 0500) BP: (92-114)/(63-75) 100/72 (07/12 0500) SpO2:  [93 %-100 %] 100 % (07/12 0500) Weight:  [89.9 kg-93.1 kg] 92.1 kg (07/12 0500)  Intake/Output from previous day: 07/11 0701 - 07/12 0700 In: -  Out: -3084  Intake/Output this shift: No intake/output data recorded.  Physical Exam:  General: Alert and oriented Abd: Soft, NT, No CVAT, No ecchymosis  Lab Results: Recent Labs    06/02/22 1031 06/03/22 0409 06/04/22 0339  HGB 8.9* 8.2* 8.0*  HCT 26.7* 25.4* 24.1*    BMET Recent Labs    06/03/22 0409 06/04/22 0339  NA 131* 132*  K 5.1 4.2  CL 92* 92*  CO2 23 26  GLUCOSE 98 102*  BUN 79* 47*  CREATININE 8.89* 6.16*  CALCIUM 8.5* 8.4*      Studies/Results:  Assessment/Plan: 1) Left perinephric hematoma secondary to blunt trauma (fall): Hemodynamically stable.  Now ambulating.  Ok to restart anticoagulation today 2) Continue to monitor H&H daily once anticoagulation resumed   LOS: 5 days   Florentina Addison 06/04/2022, 7:35 AM

## 2022-06-04 NOTE — Progress Notes (Signed)
Occupational Therapy Evaluation Patient Details Name: Christian Dalton MRN: [id number] DOB: [birth year] Today's Date: 06/04/2022   History of Present Illness Pt adm 7/7 after fall on lt side when he developed lt perinephric/subscapular hematoma with hemorrhagic shock and extravasation. Pt underwent left renal arteriogram and embolization of two subsegmental branches of the lt renal artery on 7/7.  PMH - ESRD on HD, PAD, dry gangrene of feet, CAD, DM, afib, copd   Clinical Impression   Pt seen with wife in room to confirm social and PLOF d/t hard of hearing. Pt oriented x3, improved with hearing aide use. Demo's generalized weakness, mild balance deficits and decreased activity tolerance. Reporting feeling smothered despite vss in RA. Pt requiring Sup-mod indep for bed mobility, min A for transition to and from standing, with min  A for anterior/posterior stepping with hand hold assist, improved with B UE support/RW. Demo's need for min A For LB ADLs and standing ADL simulation d/t balance and fatigue. Anticipate pt to benefit from post acute therapy services as recommended below. Wife expressing interest in assist with obtaining a w/c or transport chair at time of d/c d/t pt's level of fatigue with mobility and activity.      Recommendations for follow up therapy are one component of a multi-disciplinary discharge planning process, led by the attending physician.  Recommendations may be updated based on patient status, additional functional criteria and insurance authorization.   Follow Up Recommendations  Home health OT    Assistance Recommended at Discharge Frequent or constant Supervision/Assistance  Patient can return home with the following A little help with walking and/or transfers;A little help with bathing/dressing/bathroom;Assistance with cooking/housework    Functional Status Assessment  Patient has had a recent decline in their functional status and demonstrates the ability to make  significant improvements in function in a reasonable and predictable amount of time.  Equipment Recommendations  BSC/3in1;Wheelchair (measurements OT) (pts wife requesting/expressing interest in transport chair at time of d/c)    Recommendations for Other Services       Precautions / Restrictions Precautions Precautions: Fall Restrictions Weight Bearing Restrictions: No      Mobility Bed Mobility Overal bed mobility: Modified Independent                  Transfers Overall transfer level: Needs assistance Equipment used: 1 person hand held assist Transfers: Sit to/from Stand Sit to Stand: Min assist           General transfer comment: cues required for sequencing and safety      Balance   Sitting-balance support: Feet supported, No upper extremity supported Sitting balance-Leahy Scale: Good     Standing balance support: Bilateral upper extremity supported Standing balance-Leahy Scale: Poor                             ADL either performed or assessed with clinical judgement   ADL Overall ADL's : Needs assistance/impaired                 Upper Body Dressing : Set up   Lower Body Dressing: Minimal assistance   Toilet Transfer: Minimal assistance             General ADL Comments: pt requiring CGA-min A for LB ADL's with decreased activity tolerance for forward flexion and frequent reports of feeling smothered.     Vision Baseline Vision/History: 1 Wears glasses Vision Assessment?: No apparent visual deficits  Perception     Praxis      Pertinent Vitals/Pain Pain Assessment Pain Assessment: No/denies pain     Hand Dominance Right   Extremity/Trunk Assessment Upper Extremity Assessment Upper Extremity Assessment: Generalized weakness   Lower Extremity Assessment Lower Extremity Assessment: Generalized weakness   Cervical / Trunk Assessment Cervical / Trunk Assessment: Kyphotic   Communication  Communication Communication: HOH (request use of hearing aids)   Cognition Arousal/Alertness: Awake/alert Behavior During Therapy: WFL for tasks assessed/performed Overall Cognitive Status: Within Functional Limits for tasks assessed                                 General Comments: limited full assessment d/t significant of hard of hearing- with aides appears more appropriate     General Comments  bandages noted to L LE    Exercises     Shoulder Instructions      Home Living Family/patient expects to be discharged to:: Private residence Living Arrangements: Spouse/significant other Available Help at Discharge: Family;Neighbor;Available 24 hours/day Type of Home: Apartment Home Access: Level entry     Home Layout: One level     Bathroom Shower/Tub: Tub/shower unit;Walk-in shower   Bathroom Toilet: Handicapped height Bathroom Accessibility: Yes   Home Equipment: Conservation officer, nature (2 wheels);Rollator (4 wheels);Cane - single point   Additional Comments: HD T/TH/S      Prior Functioning/Environment Prior Level of Function : Needs assist       Physical Assist : ADLs (physical)   ADLs (physical): IADLs   ADLs Comments: wife reports using SPC for mobility majority of time, but w/c with onset of fatigue for transporting purposes        OT Problem List: Decreased strength;Decreased activity tolerance;Impaired balance (sitting and/or standing);Decreased safety awareness;Decreased knowledge of precautions;Decreased knowledge of use of DME or AE;Cardiopulmonary status limiting activity      OT Treatment/Interventions: Self-care/ADL training;Therapeutic exercise;Energy conservation;DME and/or AE instruction;Therapeutic activities;Patient/family education;Balance training    OT Goals(Current goals can be found in the care plan section) Acute Rehab OT Goals Patient Stated Goal: to feel better OT Goal Formulation: With patient/family Time For Goal Achievement:  06/18/22 Potential to Achieve Goals: Good ADL Goals Pt Will Perform Lower Body Dressing: with modified independence Pt Will Transfer to Toilet: with modified independence Pt Will Perform Toileting - Clothing Manipulation and hygiene: with modified independence Additional ADL Goal #1: pt will indep report 3 energy conservation techniques to utilize during self care completion  OT Frequency: Min 2X/week    Co-evaluation              AM-PAC OT "6 Clicks" Daily Activity     Outcome Measure Help from another person eating meals?: None Help from another person taking care of personal grooming?: A Little Help from another person toileting, which includes using toliet, bedpan, or urinal?: A Little Help from another person bathing (including washing, rinsing, drying)?: A Little Help from another person to put on and taking off regular upper body clothing?: A Little Help from another person to put on and taking off regular lower body clothing?: A Lot 6 Click Score: 18   End of Session Nurse Communication: Mobility status  Activity Tolerance: Patient limited by fatigue Patient left: in bed;with bed alarm set;with nursing/sitter in room  OT Visit Diagnosis: Unsteadiness on feet (R26.81);Muscle weakness (generalized) (M62.81)                Time: 7829-5621 OT  Time Calculation (min): 19 min Charges:  OT General Charges $OT Visit: 1 Visit OT Evaluation $OT Eval Low Complexity: 1 Low  .Felesha Moncrieffe OTR/L acute rehab services Office: [place] 06/04/2022, 3:02 PM

## 2022-06-04 NOTE — Progress Notes (Signed)
PROGRESS NOTE    Christian Dalton  IRS:[id number] DOB: [birth year] DOA: 05/30/2022 PCP: Maris Berger, MD  No chief complaint on file.   Brief Narrative:  Christian Dalton is Christian Dalton 81 y.o. male with Christian Dalton history of ESRD (TTS), PAD, CAD, atrial fibrillation, T2DM, COPD who presented to the ED on the evening of 7/7 feeling unwell, lightheaded, after Christian Dalton fall onto his left side earlier that day. Work up revealed Worsened anemia, hgb 7.5g/dl, and imaging revealed Christian Dalton large left perinephric/subcapsular hematoma. He was admitted to the ICU, given 3u PRBCs, and IR performed coil embolization of 2 tertiary branches of anterior division of left renal artery with active extravasation during arteriogram.     Assessment & Plan:   Principal Problem:   Acute blood loss anemia Active Problems:   Traumatic perinephric hematoma of left kidney   Sinus tachycardia   ESRD (end stage renal disease) on dialysis (HCC)   Acute metabolic encephalopathy   Shortness of breath   Hyperkalemia   HFrEF (heart failure with reduced ejection fraction) (HCC)   Mitral regurgitation   Fall at home, initial encounter   Thrombocytopenia ([place])   Type II diabetes mellitus with renal manifestations ([place])   PAD (peripheral artery disease) (HCC)   Gangrene of toe of left foot (HCC)   Hypothyroidism   COPD (chronic obstructive pulmonary disease) ([place])   Depression   Assessment and Plan: * Acute blood loss anemia S/p 4 units pRBC (last unit 7/9 afternoon) Labs notable for iron definiciency, folate deficiency - borderline B12 (MMA pending) Supplement iron, folate, and b12 (while awaiting MMA) Will continue to trend  Traumatic perinephric hematoma of left kidney Left perinephric hematoma: s/p percutaneous coil embolization of 2 tertiary branches of the anterior division of the L renal artery supplying 2 ill defined areas of contrast extravasation involving the inferior pole of the L kidney 7/7 by IR. No evidence of GI bleeding  currently. - Urology (repeat Ct abd/pelvis with little if any change of the L perinephric hematoma, the AP dimension may have increased by 1.2 cm, improved L perinephric fluid, still with coarse asymmetric L perinephric stranding, new embolization coils in L renal hilum, mild pelvic ascites with improvement, hemorrhagic content along L pelvic sidewall has almost entirely cleared, increased small pleural effusions with increased interstitial edema in the lung bases, uremic cystic disease,) and trauma surgery (now signed off) following.  - Hold aspirin.  He was not on anticoagulation prior to admission, just aspirin.    Sinus tachycardia Not on any AV nodal blocking agents Will continue to monitor for now, adjust as needed He was not on any anticoagulation prior to admission, on review of previous tele and EKG's -> I don't see any findings c/w atrial fibrillation/flutter (discussed with telemetry as well), will continue to monitor.   Shortness of breath Noted 7/10, no complaints today CXR 7/10 with bilateral interstitial thickening with cardiomegaly concerning for mild CHF vs infection Volume per renal, no signs of infection at this time See below   Acute metabolic encephalopathy resolved Delirium precautions Will continue to monitor  ESRD (end stage renal disease) on dialysis (HCC) TTS dialysis per renal  Mitral regurgitation Per care everywhere, moderate to severe mitral regurgitation  HFrEF (heart failure with reduced ejection fraction) (HCC) EF 20-25%, undergoing workup outpatient with cards Had planned cath on 7/7, this will need to be rescheduled - may need to reach out to [facility]? Volume per renal with dialysis    Hyperkalemia Follow per renal  lokelma  Fall at home, initial encounter PT/OT when ok to mobilize per urology  Thrombocytopenia ([place]) resolved  Type II diabetes mellitus with renal manifestations ([place]) a1c 6.4 (this may have been after transfusion,  unclear) BG's ok follow  Gangrene of toe of left foot ([place]) As above  PAD (peripheral artery disease) ([place]) Last saw Christian Dalton with vascular on 6/28 Had planned for angiogram after cardiac cath on 7/7 Holding aspirin for now  statin  COPD (chronic obstructive pulmonary disease) (HCC) No exacerbation  Hypothyroidism synthroid  Depression lexapro      DVT prophylaxis: SCD Code Status: full Family Communication: wife at bedside Disposition:   Status is: Inpatient Remains inpatient appropriate because: pending further urology recs   Consultants:  PCCM IR Nephrology Urology Trauma surgery   Procedures:  IMPRESSION: Successful percutaneous coil embolization of 2 tertiary branches of the anterior division of the left renal artery supplying 2 ill-defined areas of contrast extravasation involving the inferior pole of the left kidney.  Antimicrobials:  Anti-infectives (From admission, onward)    None       Subjective: No new complaints  Objective: Vitals:   06/04/22 0500 06/04/22 0753 06/04/22 1154 06/04/22 1625  BP: 100/72 109/61 118/70 119/67  Pulse: 100 (!) 105 (!) 105 (!) 103  Resp: '20 20 19 17  '$ Temp: 98.6 F (37 C) 98.7 F (37.1 C) 98.6 F (37 C) 98.4 F (36.9 C)  TempSrc: Oral Oral Oral Oral  SpO2: 100% 100% 99% 100%  Weight: 92.1 kg     Height:       No intake or output data in the 24 hours ending 06/04/22 1942  Filed Weights   06/03/22 0822 06/03/22 1259 06/04/22 0500  Weight: 93.1 kg 89.9 kg 92.1 kg    Examination:  General: No acute distress. Cardiovascular: tachy, regular  Lungs: unlabored Abdomen: Soft, nontender, nondistended  Neurological: Alert and oriented 3. Moves all extremities 4 with equal strength. Cranial nerves II through XII grossly intact. Extremities: dressings to LE  Data Reviewed: I have personally reviewed following labs and imaging studies  CBC: Recent Labs  Lab 05/30/22 1051 05/30/22 1107  06/01/22 0210 06/01/22 0759 06/01/22 2147 06/02/22 0014 06/02/22 1031 06/03/22 0409 06/04/22 0339  WBC 9.1   < > 10.3  --   --  9.0 10.0 8.4 6.9  NEUTROABS 7.8*  --   --   --   --   --   --  6.8 5.4  HGB 7.5*   < > 8.0*   < > 8.2* 8.2* 8.9* 8.2* 8.0*  HCT 24.7*   < > 23.9*   < > 24.8* 25.1* 26.7* 25.4* 24.1*  MCV 97.2   < > 90.2  --   --  91.3 90.5 93.0 90.9  PLT 225   < > 188  --   --  177 202 210 203   < > = values in this interval not displayed.    Basic Metabolic Panel: Recent Labs  Lab 05/31/22 0235 05/31/22 1009 06/01/22 0210 06/02/22 0014 06/03/22 0409 06/04/22 0339  NA 135 137 133* 133* 131* 132*  K 5.5* 4.2 4.5 4.7 5.1 4.2  CL 93* 93* 92* 89* 92* 92*  CO2 '24 25 25 24 23 26  '$ GLUCOSE 175* 142* 153* 154* 98 102*  BUN 52* 40* 40* 58* 79* 47*  CREATININE 9.01* 6.72* 6.74* 7.77* 8.89* 6.16*  CALCIUM 8.7* 8.8* 8.8* 8.8* 8.5* 8.4*  MG  --   --   --   --  2.2 2.0  PHOS 7.4*  --   --   --  7.4* 5.2*    GFR: Estimated Creatinine Clearance: 11.1 mL/min (Frankie Zito) (by C-G formula based on SCr of 6.16 mg/dL (H)).  Liver Function Tests: Recent Labs  Lab 05/30/22 1051 05/31/22 0235 06/03/22 0409 06/04/22 0339  AST 12*  --  7* 10*  ALT 9  --  11 13  ALKPHOS 70  --  86 109  BILITOT 0.6  --  0.6 0.8  PROT 4.9*  --  5.2* 5.1*  ALBUMIN 2.7* 2.5* 2.5* 2.4*    CBG: Recent Labs  Lab 06/04/22 0033 06/04/22 0539 06/04/22 0751 06/04/22 1154 06/04/22 1624  GLUCAP 112* 165* 110* 121* 121*     Recent Results (from the past 240 hour(s))  Blood Culture (routine x 2)     Status: None   Collection Time: 05/30/22 11:00 AM   Specimen: BLOOD  Result Value Ref Range Status   Specimen Description BLOOD SITE NOT SPECIFIED  Final   Special Requests   Final    BOTTLES DRAWN AEROBIC AND ANAEROBIC Blood Culture adequate volume   Culture   Final    NO GROWTH 5 DAYS Performed at [facility], [place] [address] [place], [place] [address]    Report Status 06/04/2022 FINAL  Final   MRSA Next Gen by PCR, Nasal     Status: None   Collection Time: 05/30/22  1:32 PM   Specimen: Nasal Mucosa; Nasal Swab  Result Value Ref Range Status   MRSA by PCR Next Gen NOT DETECTED NOT DETECTED Final    Comment: (NOTE) The GeneXpert MRSA Assay (FDA approved for NASAL specimens only), is one component of Vincent Streater comprehensive MRSA colonization surveillance program. It is not intended to diagnose MRSA infection nor to guide or monitor treatment for MRSA infections. Test performance is not FDA approved in patients less than 34 years old. Performed at [facility], [place] [address] [place], [place] [address]   Blood Culture (routine x 2)     Status: None   Collection Time: 05/30/22 11:03 PM   Specimen: BLOOD  Result Value Ref Range Status   Specimen Description BLOOD RIGHT ANTECUBITAL  Final   Special Requests   Final    BOTTLES DRAWN AEROBIC AND ANAEROBIC Blood Culture adequate volume   Culture   Final    NO GROWTH 5 DAYS Performed at [facility], [place] [address] [place], [place] [address]    Report Status 06/04/2022 FINAL  Final         Radiology Studies: CT ABDOMEN PELVIS WO CONTRAST  Result Date: 06/04/2022 CLINICAL DATA:  Traumatic left perinephric hematoma follow-up. End-stage renal disease on dialysis. EXAM: CT ABDOMEN AND PELVIS WITHOUT CONTRAST TECHNIQUE: Multidetector CT imaging of the abdomen and pelvis was performed following the standard protocol without IV contrast. RADIATION DOSE REDUCTION: This exam was performed according to the departmental dose-optimization program which includes automated exposure control, adjustment of the mA and/or kV according to patient size and/or use of iterative reconstruction technique. COMPARISON:  Study with contrast 05/30/2022. FINDINGS: Lower chest: There are small bilateral increased pleural effusions. There is increased subpleural interstitial edema in both lung bases. Calcified granuloma noted left lower lobe with  posterior atelectasis both lung bases and bilateral lower lobe bronchial thickening. There are three-vessel coronary artery calcifications cardiomegaly which was seen previously and decreased density of the intraventricular blood pool consistent with anemia. No pericardial effusion. Hepatobiliary: Cholecystectomy. Pneumobilia consistent  with sphincterotomy. No biliary dilatation. Unremarkable unenhanced liver parenchyma. Pancreas: Diffusely atrophic and otherwise unremarkable. Spleen: Homogeneous noncontrast attenuation.  No splenomegaly. Adrenals/Urinary Tract: There is no adrenal mass on the right. Stable 1.5 cm left adrenal genu adenoma of 1.3 Hounsfield units. 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 Perirenal stranding is again noted on the left-greater-than-right. Small amount of scattered left perinephric fluid is again noted and improved. Embolization coils newly noted along the anterior aspect of the hilum of the left lower renal pole. Hortence Charter left perinephric hematoma is again noted. Currently this measures 14.0 cm AP, 11.7 cm transversely and 16.3 cm craniocaudal. Corresponding measurements on the prior study were 15.2 by 12.0 by 15.9 cm respectively. No evidence of urinary stones or obstruction is seen. The bladder is contracted and not well seen. Stomach/Bowel: Small hiatal hernia. No bowel obstruction or inflammatory change. Normal appendix. Left-sided diverticula noted without evidence of acute diverticulitis. Vascular/Lymphatic: Extensive vascular calcifications in the aorta and branch arteries. No AAA. No appreciable adenopathy. Reproductive: Enlarged prostate gland 5.3 cm transverse. Other: Small volume of free ascites with improvement. This is almost all in the pelvis with minimal fluid along the paracolic gutters. There is no free hemorrhage abscess or free air. No incarcerated hernia is seen with small inguinal fat hernias again  shown. Musculoskeletal: Chronic pars defects L5 with slight grade 1 L5-S1 spondylolisthesis. Osteopenia and degenerative change. There are spinal bridging syndesmophytes in the thoracic region. No destructive bone lesion is seen. IMPRESSION: 1. Little if any change of the left perinephric hematoma. The AP dimension may have increased by 1.2 cm. 2. Improved left perinephric fluid, still with coarse asymmetric left perinephric stranding. 3. New embolization coils in the left renal hilum. 4. Mild pelvic ascites with improvement. There previously was hemorrhagic content along the left pelvic sidewall which has almost entirely cleared in the interval. 5. Increased small pleural effusions with increased interstitial edema in the lung bases. Cardiomegaly. 6. Atrophic kidneys with innumerable cysts of varying density consistent with uremic cystic disease. 7. Extensive vascular calcifications coronary atherosclerosis. 8. Postcholecystectomy chronic pneumobilia. 9. Remaining findings discussed above. Electronically Signed   By: Telford Nab M.D.   On: 06/04/2022 01:01        Scheduled Meds:  sodium chloride   Intravenous Once   atorvastatin  10 mg Oral QHS   buPROPion  150 mg Oral QPM   Chlorhexidine Gluconate Cloth  6 each Topical Q0600   darbepoetin (ARANESP) injection - DIALYSIS  60 mcg Intravenous Q Tue-HD   escitalopram  10 mg Oral QHS   ferrous sulfate  325 mg Oral QODAY   folic acid  1 mg Oral Daily   insulin aspart  0-6 Units Subcutaneous Q4H   lactulose  10 g Oral q1800   levothyroxine  125 mcg Oral Q0600   montelukast  10 mg Oral QPM   sodium zirconium cyclosilicate  10 g Oral Daily   vitamin B-12  1,000 mcg Oral Daily   Continuous Infusions:  sodium chloride Stopped (05/30/22 1717)   sodium chloride Stopped (05/30/22 1717)     LOS: 5 days    Time spent: over 30 min    Fayrene Helper, MD [facility]   To contact the attending provider between 7A-7P or the covering  provider during after hours 7P-7A, please log into the web site [email or web address] and access using universal [place] password for that web site. If you do not have the password,  please call the hospital operator.  06/04/2022, 7:42 PM

## 2022-06-04 NOTE — Evaluation (Signed)
Physical Therapy Evaluation Patient Details Name: Christian Dalton MRN: [id number] DOB: [birth year] Today's Date: 06/04/2022  History of Present Illness  Pt adm 7/7 after fall on lt side when he developed lt perinephric/subscapular hematoma with hemorrhagic shock and extravasation. Pt underwent left renal arteriogram and embolization of two subsegmental branches of the lt renal artery on 7/7.  PMH - ESRD on HD, PAD, dry gangrene of feet, CAD, DM, afib, copd  Clinical Impression  Pt's PT impairments include generalized weakness, gait and balance deficits, and activity intolerance. Pt is limited by fatigue during today's session. Pt ambulates limited household distances with an AD and no supplemental O2 (O2 set to 2L on wall but Pt isn't wearing [place] when received). Continued therapy will assist the Pt in building strength and activity tolerance to progress towards his baseline level of independence and decrease his likelihood of another fall. PT recommends home health PT to Pt to continue improving upon his listed impairments.     Recommendations for follow up therapy are one component of a multi-disciplinary discharge planning process, led by the attending physician.  Recommendations may be updated based on patient status, additional functional criteria and insurance authorization.  Follow Up Recommendations Home health PT      Assistance Recommended at Discharge Intermittent Supervision/Assistance  Patient can return home with the following  A little help with walking and/or transfers;A little help with bathing/dressing/bathroom;Assistance with cooking/housework;Assist for transportation;Help with stairs or ramp for entrance    Equipment Recommendations None recommended by PT  Recommendations for Other Services       Functional Status Assessment Patient has had a recent decline in their functional status and demonstrates the ability to make significant improvements in function in a reasonable and  predictable amount of time.     Precautions / Restrictions Precautions Precautions: Fall Restrictions Weight Bearing Restrictions: No      Mobility  Bed Mobility Overal bed mobility: Modified Independent                  Transfers Overall transfer level: Needs assistance Equipment used: Rolling walker (2 wheels) Transfers: Sit to/from Stand Sit to Stand: Min guard, From elevated surface           General transfer comment: PT gives verbal cues for hand placement    Ambulation/Gait Ambulation/Gait assistance: Min guard Gait Distance (Feet): 20 Feet Assistive device: Rolling walker (2 wheels) Gait Pattern/deviations: Step-to pattern, Decreased step length - right, Decreased step length - left, Decreased stride length Gait velocity: decreased Gait velocity interpretation: <1.31 ft/sec, indicative of household ambulator   General Gait Details: Pt reports SOB at end of ambulation but O2 sats remain above 98% on RA  Stairs            Wheelchair Mobility    Modified Rankin (Stroke Patients Only)       Balance Overall balance assessment: Needs assistance Sitting-balance support: No upper extremity supported, Feet supported Sitting balance-Leahy Scale: Good     Standing balance support: Bilateral upper extremity supported, Reliant on assistive device for balance Standing balance-Leahy Scale: Poor                               Pertinent Vitals/Pain Pain Assessment Pain Assessment: No/denies pain    Home Living Family/patient expects to be discharged to:: Private residence Living Arrangements: Spouse/significant other Available Help at Discharge: Family;Neighbor;Available 24 hours/day Type of Home: Apartment Home Access: Level entry  Home Layout: One level Home Equipment: Conservation officer, nature (2 wheels);Rollator (4 wheels);Cane - single point      Prior Function Prior Level of Function : Needs assist       Physical Assist :  ADLs (physical)   ADLs (physical): IADLs (Wife drives him to dialysis)   ADLs Comments: Pt reports using RW, Rollator, and SPC for household ambulation; doesn't specifiy which he uses most frequently     Hand Dominance        Extremity/Trunk Assessment   Upper Extremity Assessment Upper Extremity Assessment: Generalized weakness    Lower Extremity Assessment Lower Extremity Assessment: Generalized weakness    Cervical / Trunk Assessment Cervical / Trunk Assessment: Kyphotic  Communication   Communication: HOH (Pt not wearing his hearing aids)  Cognition Arousal/Alertness: Awake/alert Behavior During Therapy: WFL for tasks assessed/performed Overall Cognitive Status: Within Functional Limits for tasks assessed                                          General Comments General comments (skin integrity, edema, etc.): Pt received on 2L O2, however he wasn't wearing his [place] with an SpO2 of 100%; PT ambulates Pt without supplemental O2 and returns Pt to chair without O2    Exercises     Assessment/Plan    PT Assessment Patient needs continued PT services  PT Problem List Decreased strength;Decreased activity tolerance;Decreased balance;Decreased mobility;Decreased knowledge of use of DME;Cardiopulmonary status limiting activity       PT Treatment Interventions DME instruction;Gait training;Stair training;Functional mobility training;Therapeutic activities;Therapeutic exercise;Balance training;Patient/family education    PT Goals (Current goals can be found in the Care Plan section)  Acute Rehab PT Goals Patient Stated Goal: Return home; get back to exercising PT Goal Formulation: With patient Time For Goal Achievement: 06/18/22 Potential to Achieve Goals: Good    Frequency Min 3X/week     Co-evaluation               AM-PAC PT "6 Clicks" Mobility  Outcome Measure Help needed turning from your back to your side while in a flat bed without  using bedrails?: None Help needed moving from lying on your back to sitting on the side of a flat bed without using bedrails?: None Help needed moving to and from a bed to a chair (including a wheelchair)?: A Little Help needed standing up from a chair using your arms (e.g., wheelchair or bedside chair)?: A Little Help needed to walk in hospital room?: A Little Help needed climbing 3-5 steps with a railing? : A Lot 6 Click Score: 19    End of Session Equipment Utilized During Treatment: Gait belt Activity Tolerance: Patient limited by fatigue (Pt reports SOB and feeling relatively weak) Patient left: in chair;with call bell/phone within reach;with chair alarm set;with family/visitor present Nurse Communication: Mobility status PT Visit Diagnosis: Unsteadiness on feet (R26.81);History of falling (Z91.81);Muscle weakness (generalized) (M62.81);Difficulty in walking, not elsewhere classified (R26.2)    Time: 1100-1137 PT Time Calculation (min) (ACUTE ONLY): 37 min   Charges:   PT Evaluation $PT Eval Low Complexity: 1 Low          Hall Busing, SPT Acute Rehabilitation Office #: [phone]   Hall Busing 06/04/2022, 12:50 PM

## 2022-06-04 NOTE — Progress Notes (Signed)
[facility] Progress Note   Subjective:    Seen and examined patient at bedside. No acute complaints/concerns. Denies SOB, CP, and N/V. Plan for HD 7/13.   Objective Vitals:   06/04/22 0028 06/04/22 0500 06/04/22 0753 06/04/22 1154  BP: 92/66 100/72 109/61 118/70  Pulse: (!) 103 100 (!) 105 (!) 105  Resp: '20 20 20 19  '$ Temp: 98.6 F (37 C) 98.6 F (37 C) 98.7 F (37.1 C) 98.6 F (37 C)  TempSrc: Oral Oral Oral Oral  SpO2: 98% 100% 100% 99%  Weight:  92.1 kg    Height:       Physical Exam General: Elderly male; NAD; On RA Heart: S1 and S2; No murmurs, gallops, rales Lungs: Clear throughout Abdomen: Soft and non-tender Extremities: No edema BLLE Dialysis Access: L AVF (+) B/T  Filed Weights   06/03/22 0822 06/03/22 1259 06/04/22 0500  Weight: 93.1 kg 89.9 kg 92.1 kg   No intake or output data in the 24 hours ending 06/04/22 1255  Additional Objective Labs: Basic Metabolic Panel: Recent Labs  Lab 05/31/22 0235 05/31/22 1009 06/02/22 0014 06/03/22 0409 06/04/22 0339  NA 135   < > 133* 131* 132*  K 5.5*   < > 4.7 5.1 4.2  CL 93*   < > 89* 92* 92*  CO2 24   < > '24 23 26  '$ GLUCOSE 175*   < > 154* 98 102*  BUN 52*   < > 58* 79* 47*  CREATININE 9.01*   < > 7.77* 8.89* 6.16*  CALCIUM 8.7*   < > 8.8* 8.5* 8.4*  PHOS 7.4*  --   --  7.4* 5.2*   < > = values in this interval not displayed.   Liver Function Tests: Recent Labs  Lab 05/30/22 1051 05/31/22 0235 06/03/22 0409 06/04/22 0339  AST 12*  --  7* 10*  ALT 9  --  11 13  ALKPHOS 70  --  86 109  BILITOT 0.6  --  0.6 0.8  PROT 4.9*  --  5.2* 5.1*  ALBUMIN 2.7* 2.5* 2.5* 2.4*   Recent Labs  Lab 05/30/22 1051  LIPASE 18   CBC: Recent Labs  Lab 05/30/22 1051 05/30/22 1107 06/01/22 0210 06/01/22 0759 06/02/22 0014 06/02/22 1031 06/03/22 0409 06/04/22 0339  WBC 9.1   < > 10.3  --  9.0 10.0 8.4 6.9  NEUTROABS 7.8*  --   --   --   --   --  6.8 5.4  HGB 7.5*   < > 8.0*   < > 8.2* 8.9*  8.2* 8.0*  HCT 24.7*   < > 23.9*   < > 25.1* 26.7* 25.4* 24.1*  MCV 97.2   < > 90.2  --  91.3 90.5 93.0 90.9  PLT 225   < > 188  --  177 202 210 203   < > = values in this interval not displayed.   Blood Culture    Component Value Date/Time   SDES BLOOD RIGHT ANTECUBITAL 05/30/2022 2303   SPECREQUEST  05/30/2022 2303    BOTTLES DRAWN AEROBIC AND ANAEROBIC Blood Culture adequate volume   CULT  05/30/2022 2303    NO GROWTH 5 DAYS Performed at [facility], [place] [address] [place], [place] [address]    REPTSTATUS 06/04/2022 FINAL 05/30/2022 2303    Cardiac Enzymes: No results for input(s): "CKTOTAL", "CKMB", "CKMBINDEX", "TROPONINI" in the last 168 hours. CBG: Recent Labs  Lab 06/03/22 2036 06/04/22  0033 06/04/22 0539 06/04/22 0751 06/04/22 1154  GLUCAP 147* 112* 165* 110* 121*   Iron Studies:  Recent Labs    06/03/22 0409  IRON 23*  TIBC 197*  FERRITIN 405*   Lab Results  Component Value Date   INR 1.4 (H) 05/30/2022   INR 1.07 11/23/2018   INR 1.22 08/19/2018   Studies/Results: CT ABDOMEN PELVIS WO CONTRAST  Result Date: 06/04/2022 CLINICAL DATA:  Traumatic left perinephric hematoma follow-up. End-stage renal disease on dialysis. EXAM: CT ABDOMEN AND PELVIS WITHOUT CONTRAST TECHNIQUE: Multidetector CT imaging of the abdomen and pelvis was performed following the standard protocol without IV contrast. RADIATION DOSE REDUCTION: This exam was performed according to the departmental dose-optimization program which includes automated exposure control, adjustment of the mA and/or kV according to patient size and/or use of iterative reconstruction technique. COMPARISON:  Study with contrast 05/30/2022. FINDINGS: Lower chest: There are small bilateral increased pleural effusions. There is increased subpleural interstitial edema in both lung bases. Calcified granuloma noted left lower lobe with posterior atelectasis both lung bases and bilateral lower lobe bronchial  thickening. There are three-vessel coronary artery calcifications cardiomegaly which was seen previously and decreased density of the intraventricular blood pool consistent with anemia. No pericardial effusion. Hepatobiliary: Cholecystectomy. Pneumobilia consistent with sphincterotomy. No biliary dilatation. Unremarkable unenhanced liver parenchyma. Pancreas: Diffusely atrophic and otherwise unremarkable. Spleen: Homogeneous noncontrast attenuation.  No splenomegaly. Adrenals/Urinary Tract: There is no adrenal mass on the right. Stable 1.5 cm left adrenal genu adenoma of 1.3 Hounsfield units. 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 Perirenal stranding is again noted on the left-greater-than-right. Small amount of scattered left perinephric fluid is again noted and improved. Embolization coils newly noted along the anterior aspect of the hilum of the left lower renal pole. A left perinephric hematoma is again noted. Currently this measures 14.0 cm AP, 11.7 cm transversely and 16.3 cm craniocaudal. Corresponding measurements on the prior study were 15.2 by 12.0 by 15.9 cm respectively. No evidence of urinary stones or obstruction is seen. The bladder is contracted and not well seen. Stomach/Bowel: Small hiatal hernia. No bowel obstruction or inflammatory change. Normal appendix. Left-sided diverticula noted without evidence of acute diverticulitis. Vascular/Lymphatic: Extensive vascular calcifications in the aorta and branch arteries. No AAA. No appreciable adenopathy. Reproductive: Enlarged prostate gland 5.3 cm transverse. Other: Small volume of free ascites with improvement. This is almost all in the pelvis with minimal fluid along the paracolic gutters. There is no free hemorrhage abscess or free air. No incarcerated hernia is seen with small inguinal fat hernias again shown. Musculoskeletal: Chronic pars defects L5 with slight grade 1 L5-S1  spondylolisthesis. Osteopenia and degenerative change. There are spinal bridging syndesmophytes in the thoracic region. No destructive bone lesion is seen. IMPRESSION: 1. Little if any change of the left perinephric hematoma. The AP dimension may have increased by 1.2 cm. 2. Improved left perinephric fluid, still with coarse asymmetric left perinephric stranding. 3. New embolization coils in the left renal hilum. 4. Mild pelvic ascites with improvement. There previously was hemorrhagic content along the left pelvic sidewall which has almost entirely cleared in the interval. 5. Increased small pleural effusions with increased interstitial edema in the lung bases. Cardiomegaly. 6. Atrophic kidneys with innumerable cysts of varying density consistent with uremic cystic disease. 7. Extensive vascular calcifications coronary atherosclerosis. 8. Postcholecystectomy chronic pneumobilia. 9. Remaining findings discussed above. Electronically Signed   By: Telford Nab M.D.   On: 06/04/2022  01:01   DG CHEST PORT 1 VIEW  Result Date: 06/02/2022 CLINICAL DATA:  Acute on chronic shortness of breath EXAM: PORTABLE CHEST 1 VIEW COMPARISON:  05/30/2022 FINDINGS: Bilateral mild interstitial thickening. No focal consolidation. No pleural effusion or pneumothorax. Stable cardiomegaly. No acute osseous abnormality. IMPRESSION: 1. Bilateral interstitial thickening with cardiomegaly concerning for mild CHF versus infection. Electronically Signed   By: Kathreen Devoid M.D.   On: 06/02/2022 14:43    Medications:  sodium chloride Stopped (05/30/22 1717)   sodium chloride Stopped (05/30/22 1717)    sodium chloride   Intravenous Once   atorvastatin  10 mg Oral QHS   buPROPion  150 mg Oral QPM   Chlorhexidine Gluconate Cloth  6 each Topical Q0600   darbepoetin (ARANESP) injection - DIALYSIS  60 mcg Intravenous Q Tue-HD   escitalopram  10 mg Oral QHS   ferrous sulfate  325 mg Oral QODAY   folic acid  1 mg Oral Daily   insulin  aspart  0-6 Units Subcutaneous Q4H   lactulose  10 g Oral q1800   levothyroxine  125 mcg Oral Q0600   montelukast  10 mg Oral QPM   sodium zirconium cyclosilicate  10 g Oral Daily   vitamin B-12  1,000 mcg Oral Daily    Dialysis Orders: Unit: [facility]TS Time: 3:45  EDW: 90.2 kg Flows: 425/800  Bath: 2K/2Ca  Access: AVF 15 g Heparin: None ESA: Mircera 30 q 4 wks (to start 7/11) VDRA: Calcitriol 2.25 q HD   Assessment/Plan: Left kidney hematoma/ABLA - large perinephric/subscapular hematoma of left kidney after fall at home. IR consulted - s/p embolization 05/30/22. Urology following. Per primary team  ESRD -  HD TTS. Does not complete full treatments as outpatient. K+ 5.2 prior to admission but now improving. Plan for HD 7/13 Hypertension/volume  - Hypotensive on admission in the setting of blood loss. Repletion with prbcs. HD if BP permits.  Acute blood loss anemia   - Hgb 7.5 on admission. S/p 4 units PRBSs during hospitalization. Hgb now 8. ESA given with HD 7/11. Will raise dose if indicated. Transfuse if Hgb < 7. Atrial fib with RVR - per primary svc Metabolic bone disease -  Calcium acceptable. Continue home meds  Nutrition - Renal diet/fluid restriction when eating DM -per primary PAD - LLE ulcer - follows with Dr. Donzetta Matters CAD Thrombocytopenia - likely due to recent ABLA. Dispo: Okay for discharge from renal standpoint  Tobie Poet, NP [place] 06/04/2022,12:55 PM  LOS: 5 days

## 2022-06-04 NOTE — Progress Notes (Signed)
Pt receives out-pt HD at [facility] on TTS. Pt arrives at 10:40 for 11:00 chair time. Will assist as needed.   Adamarys Shall Renal Navigator [phone] 

## 2022-06-05 LAB — BASIC METABOLIC PANEL
Anion gap: 15 (ref 5–15)
BUN: 62 mg/dL — ABNORMAL HIGH (ref 8–23)
CO2: 23 mmol/L (ref 22–32)
Calcium: 8.5 mg/dL — ABNORMAL LOW (ref 8.9–10.3)
Chloride: 93 mmol/L — ABNORMAL LOW (ref 98–111)
Creatinine, Ser: 7.06 mg/dL — ABNORMAL HIGH (ref 0.61–1.24)
GFR, Estimated: 7 mL/min — ABNORMAL LOW (ref >60)
Glucose, Bld: 112 mg/dL — ABNORMAL HIGH (ref 70–99)
Potassium: 4.3 mmol/L (ref 3.5–5.1)
Sodium: 131 mmol/L — ABNORMAL LOW (ref 135–145)

## 2022-06-05 LAB — CBC WITH DIFFERENTIAL/PLATELET
Abs Immature Granulocytes: 0.07 10*3/uL (ref 0.00–0.07)
Basophils Absolute: 0 10*3/uL (ref 0.0–0.1)
Basophils Relative: 0 %
Eosinophils Absolute: 0.2 10*3/uL (ref 0.0–0.5)
Eosinophils Relative: 2 %
HCT: 25.5 % — ABNORMAL LOW (ref 39.0–52.0)
Hemoglobin: 8.3 g/dL — ABNORMAL LOW (ref 13.0–17.0)
Immature Granulocytes: 1 %
Lymphocytes Relative: 8 %
Lymphs Abs: 0.7 10*3/uL (ref 0.7–4.0)
MCH: 29.9 pg (ref 26.0–34.0)
MCHC: 32.5 g/dL (ref 30.0–36.0)
MCV: 91.7 fL (ref 80.0–100.0)
Monocytes Absolute: 1.1 10*3/uL — ABNORMAL HIGH (ref 0.1–1.0)
Monocytes Relative: 13 %
Neutro Abs: 6.4 10*3/uL (ref 1.7–7.7)
Neutrophils Relative %: 76 %
Platelets: 244 10*3/uL (ref 150–400)
RBC: 2.78 MIL/uL — ABNORMAL LOW (ref 4.22–5.81)
RDW: 14.9 % (ref 11.5–15.5)
WBC: 8.3 10*3/uL (ref 4.0–10.5)
nRBC: 0 % (ref 0.0–0.2)

## 2022-06-05 LAB — GLUCOSE, CAPILLARY
Glucose-Capillary: 103 mg/dL — ABNORMAL HIGH (ref 70–99)
Glucose-Capillary: 104 mg/dL — ABNORMAL HIGH (ref 70–99)
Glucose-Capillary: 116 mg/dL — ABNORMAL HIGH (ref 70–99)
Glucose-Capillary: 90 mg/dL (ref 70–99)
Glucose-Capillary: 94 mg/dL (ref 70–99)

## 2022-06-05 LAB — METHYLMALONIC ACID, SERUM: Methylmalonic Acid, Quantitative: 2312 nmol/L — ABNORMAL HIGH (ref 0–378)

## 2022-06-05 LAB — MAGNESIUM: Magnesium: 2.1 mg/dL (ref 1.7–2.4)

## 2022-06-05 LAB — PHOSPHORUS: Phosphorus: 6.2 mg/dL — ABNORMAL HIGH (ref 2.5–4.6)

## 2022-06-05 MED ORDER — FOLIC ACID 1 MG PO TABS: 1.0000 mg | ORAL_TABLET | Freq: Every day | ORAL | 0 refills | Status: AC

## 2022-06-05 MED ORDER — CYANOCOBALAMIN 1000 MCG PO TABS: 1000.0000 ug | ORAL_TABLET | Freq: Every day | ORAL | 0 refills | Status: AC

## 2022-06-05 MED ORDER — FERROUS SULFATE 325 (65 FE) MG PO TABS: 325.0000 mg | ORAL_TABLET | ORAL | 0 refills | Status: DC

## 2022-06-05 NOTE — Progress Notes (Signed)
Patient ID: Christian Dalton, male   DOB: [birth year], 81 y.o.   MRN: [id number]  Hgb stable on anticoagulation.  Please call if any concerns for bleeding.  Will sign off and plan to schedule patient for outpatient follow up in 2-3 months for repeat imaging to ensure there is no underlying renal mass.

## 2022-06-05 NOTE — TOC CAGE-AID Note (Signed)
Transition of Care [facility]) - CAGE-AID Screening   Patient Details  Name: Christian Dalton MRN: [id number] Date of Birth: Apr 20, 1941  Transition of Care [facility]) CM/SW Contact:    Coralee Pesa, [place] Phone Number: 06/05/2022, 9:55 AM   Clinical Narrative:  Per chart review, pt is not oriented at this time to participate in CAGE- AID assessment.   CAGE-AID Screening: Substance Abuse Screening unable to be completed due to: : Patient unable to participate             Substance Abuse Education Offered: No

## 2022-06-05 NOTE — Progress Notes (Addendum)
[facility] Progress Note   Subjective:    Seen and examined patient on HD. Lowered UFG to 1.5L today. No acute issues. Denies SOB, CP, and N/V. He may go home today.  Objective Vitals:   06/05/22 0757 06/05/22 0800 06/05/22 0830 06/05/22 0900  BP:  109/68 107/63 122/69  Pulse:  99 (!) 101 (!) 102  Resp:  (!) 22 14 (!) 21  Temp:      TempSrc:      SpO2:  96% 100% 100%  Weight: 91.3 kg     Height:       Physical Exam General: Elderly male; NAD; On RA Heart: S1 and S2; No murmurs, gallops, rales Lungs: Clear throughout Abdomen: Soft and non-tender Extremities: No edema BLLE Dialysis Access: L AVF (+) B/T    Filed Weights   06/04/22 0500 06/05/22 0450 06/05/22 0757  Weight: 92.1 kg 90.9 kg 91.3 kg    Intake/Output Summary (Last 24 hours) at 06/05/2022 0923 Last data filed at 06/05/2022 0509 Gross per 24 hour  Intake 240 ml  Output --  Net 240 ml    Additional Objective Labs: Basic Metabolic Panel: Recent Labs  Lab 06/03/22 0409 06/04/22 0339 06/05/22 0224  NA 131* 132* 131*  K 5.1 4.2 4.3  CL 92* 92* 93*  CO2 '23 26 23  '$ GLUCOSE 98 102* 112*  BUN 79* 47* 62*  CREATININE 8.89* 6.16* 7.06*  CALCIUM 8.5* 8.4* 8.5*  PHOS 7.4* 5.2* 6.2*   Liver Function Tests: Recent Labs  Lab 05/30/22 1051 05/31/22 0235 06/03/22 0409 06/04/22 0339  AST 12*  --  7* 10*  ALT 9  --  11 13  ALKPHOS 70  --  86 109  BILITOT 0.6  --  0.6 0.8  PROT 4.9*  --  5.2* 5.1*  ALBUMIN 2.7* 2.5* 2.5* 2.4*   Recent Labs  Lab 05/30/22 1051  LIPASE 18   CBC: Recent Labs  Lab 06/02/22 0014 06/02/22 1031 06/03/22 0409 06/04/22 0339 06/05/22 0224  WBC 9.0 10.0 8.4 6.9 8.3  NEUTROABS  --   --  6.8 5.4 6.4  HGB 8.2* 8.9* 8.2* 8.0* 8.3*  HCT 25.1* 26.7* 25.4* 24.1* 25.5*  MCV 91.3 90.5 93.0 90.9 91.7  PLT 177 202 210 203 244   Blood Culture    Component Value Date/Time   SDES BLOOD RIGHT ANTECUBITAL 05/30/2022 2303   SPECREQUEST  05/30/2022 2303    BOTTLES  DRAWN AEROBIC AND ANAEROBIC Blood Culture adequate volume   CULT  05/30/2022 2303    NO GROWTH 5 DAYS Performed at [facility], [place] [address] [place], [place] [id number]    REPTSTATUS 06/04/2022 FINAL 05/30/2022 2303    Cardiac Enzymes: No results for input(s): "CKTOTAL", "CKMB", "CKMBINDEX", "TROPONINI" in the last 168 hours. CBG: Recent Labs  Lab 06/04/22 1624 06/04/22 2015 06/05/22 0004 06/05/22 0447 06/05/22 0708  GLUCAP 121* 161* 116* 104* 94   Iron Studies:  Recent Labs    06/03/22 0409  IRON 23*  TIBC 197*  FERRITIN 405*   Lab Results  Component Value Date   INR 1.4 (H) 05/30/2022   INR 1.07 11/23/2018   INR 1.22 08/19/2018   Studies/Results: CT ABDOMEN PELVIS WO CONTRAST  Result Date: 06/04/2022 CLINICAL DATA:  Traumatic left perinephric hematoma follow-up. End-stage renal disease on dialysis. EXAM: CT ABDOMEN AND PELVIS WITHOUT CONTRAST TECHNIQUE: Multidetector CT imaging of the abdomen and pelvis was performed following the standard protocol without IV contrast. RADIATION DOSE REDUCTION: This  exam was performed according to the departmental dose-optimization program which includes automated exposure control, adjustment of the mA and/or kV according to patient size and/or use of iterative reconstruction technique. COMPARISON:  Study with contrast 05/30/2022. FINDINGS: Lower chest: There are small bilateral increased pleural effusions. There is increased subpleural interstitial edema in both lung bases. Calcified granuloma noted left lower lobe with posterior atelectasis both lung bases and bilateral lower lobe bronchial thickening. There are three-vessel coronary artery calcifications cardiomegaly which was seen previously and decreased density of the intraventricular blood pool consistent with anemia. No pericardial effusion. Hepatobiliary: Cholecystectomy. Pneumobilia consistent with sphincterotomy. No biliary dilatation. Unremarkable unenhanced liver  parenchyma. Pancreas: Diffusely atrophic and otherwise unremarkable. Spleen: Homogeneous noncontrast attenuation.  No splenomegaly. Adrenals/Urinary Tract: There is no adrenal mass on the right. Stable 1.5 cm left adrenal genu adenoma of 1.3 Hounsfield units. 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 Perirenal stranding is again noted on the left-greater-than-right. Small amount of scattered left perinephric fluid is again noted and improved. Embolization coils newly noted along the anterior aspect of the hilum of the left lower renal pole. A left perinephric hematoma is again noted. Currently this measures 14.0 cm AP, 11.7 cm transversely and 16.3 cm craniocaudal. Corresponding measurements on the prior study were 15.2 by 12.0 by 15.9 cm respectively. No evidence of urinary stones or obstruction is seen. The bladder is contracted and not well seen. Stomach/Bowel: Small hiatal hernia. No bowel obstruction or inflammatory change. Normal appendix. Left-sided diverticula noted without evidence of acute diverticulitis. Vascular/Lymphatic: Extensive vascular calcifications in the aorta and branch arteries. No AAA. No appreciable adenopathy. Reproductive: Enlarged prostate gland 5.3 cm transverse. Other: Small volume of free ascites with improvement. This is almost all in the pelvis with minimal fluid along the paracolic gutters. There is no free hemorrhage abscess or free air. No incarcerated hernia is seen with small inguinal fat hernias again shown. Musculoskeletal: Chronic pars defects L5 with slight grade 1 L5-S1 spondylolisthesis. Osteopenia and degenerative change. There are spinal bridging syndesmophytes in the thoracic region. No destructive bone lesion is seen. IMPRESSION: 1. Little if any change of the left perinephric hematoma. The AP dimension may have increased by 1.2 cm. 2. Improved left perinephric fluid, still with coarse asymmetric left  perinephric stranding. 3. New embolization coils in the left renal hilum. 4. Mild pelvic ascites with improvement. There previously was hemorrhagic content along the left pelvic sidewall which has almost entirely cleared in the interval. 5. Increased small pleural effusions with increased interstitial edema in the lung bases. Cardiomegaly. 6. Atrophic kidneys with innumerable cysts of varying density consistent with uremic cystic disease. 7. Extensive vascular calcifications coronary atherosclerosis. 8. Postcholecystectomy chronic pneumobilia. 9. Remaining findings discussed above. Electronically Signed   By: Telford Nab M.D.   On: 06/04/2022 01:01    Medications:  sodium chloride Stopped (05/30/22 1717)   sodium chloride Stopped (05/30/22 1717)    sodium chloride   Intravenous Once   atorvastatin  10 mg Oral QHS   buPROPion  150 mg Oral QPM   darbepoetin (ARANESP) injection - DIALYSIS  60 mcg Intravenous Q Tue-HD   escitalopram  10 mg Oral QHS   ferrous sulfate  325 mg Oral QODAY   folic acid  1 mg Oral Daily   insulin aspart  0-6 Units Subcutaneous Q4H   lactulose  10 g Oral q1800   levothyroxine  125 mcg Oral Q0600   montelukast  10 mg  Oral QPM   sodium zirconium cyclosilicate  10 g Oral Daily   vitamin B-12  1,000 mcg Oral Daily    Dialysis Orders: Unit: [facility] TTS Time: 3:45  EDW: 90.2 kg Flows: 425/800  [place]ath: 2K/2Ca  Access: AVF 15 g Heparin: None ESA: Mircera 30 q 4 wks (to start 7/11) VDRA: Calcitriol 2.25 q HD   Assessment/Plan: Left kidney hematoma/ABLA - large perinephric/subscapular hematoma of left kidney after fall at home. IR consulted - s/p embolization 05/30/22. Urology following-now back on AC, Hgb remain stable, pt will see Urology for f/u in 2-3 months. Per primary team  ESRD -  HD TTS. Does not complete full treatments as outpatient. K+ 5.2 prior to admission but now improving. On HD today. Hypertension/volume  - Hypotensive on admission in the setting of blood  loss. Repletion with prbcs. HD if BP permits.  Acute blood loss anemia   - Hgb 7.5 on admission. S/p 4 units PRBSs during hospitalization. Hgb now 8.3. ESA given with HD 7/11. Will raise dose if indicated. Transfuse if Hgb < 7. Atrial fib with RVR - per primary svc Metabolic bone disease -  Calcium acceptable. Noted PO4 elevated, resume binders in outpatient.  Nutrition - Renal diet/fluid restriction when eating DM -per primary PAD - LLE ulcer - follows with Dr. Donzetta Matters CAD Thrombocytopenia - likely due to recent ABLA. Dispo: Okay for discharge from renal standpoint  Tobie Poet, NP [place] 06/05/2022,9:23 AM  LOS: 6 days

## 2022-06-05 NOTE — Progress Notes (Signed)
Physical Therapy Treatment Patient Details Name: Christian Dalton MRN: [id number] DOB: 1941/08/10 Today's Date: 06/05/2022   History of Present Illness Pt adm 7/7 after fall on lt side when he developed lt perinephric/subscapular hematoma with hemorrhagic shock and extravasation. Pt underwent left renal arteriogram and embolization of two subsegmental branches of the lt renal artery on 7/7.  PMH - ESRD on HD, PAD, dry gangrene of feet, CAD, DM, afib, copd    PT Comments    Despite recently back from HD, pt agreed to participating.  Emphasis on transition to EOB, safety with sit to standing and progression of gait stability/stamina with the RW.  Per pt/wife, they need a newer/ lighter w/c for assist in the community.    Recommendations for follow up therapy are one component of a multi-disciplinary discharge planning process, led by the attending physician.  Recommendations may be updated based on patient status, additional functional criteria and insurance authorization.  Follow Up Recommendations  Home health PT     Assistance Recommended at Discharge Intermittent Supervision/Assistance  Patient can return home with the following A little help with walking and/or transfers;A little help with bathing/dressing/bathroom;Assistance with cooking/housework;Assist for transportation;Help with stairs or ramp for entrance   Equipment Recommendations  Wheelchair (measurements PT) (other is older and too heavy to lift)    Recommendations for Other Services       Precautions / Restrictions Precautions Precautions: Fall     Mobility  Bed Mobility Overal bed mobility: Modified Independent                  Transfers Overall transfer level: Needs assistance   Transfers: Sit to/from Stand Sit to Stand: Min guard           General transfer comment: cues for hand placement/safety    Ambulation/Gait Ambulation/Gait assistance: Min guard, Min assist Gait Distance (Feet): 70  Feet Assistive device: Rolling walker (2 wheels) Gait Pattern/deviations: Step-through pattern Gait velocity: decreased Gait velocity interpretation: <1.8 ft/sec, indicate of risk for recurrent falls   General Gait Details: mildly retropulsive/posterior bias, otherwise steady gait, great/appropriate use of the RW.   Stairs             Wheelchair Mobility    Modified Rankin (Stroke Patients Only)       Balance Overall balance assessment: Needs assistance   Sitting balance-Leahy Scale: Good       Standing balance-Leahy Scale: Fair Standing balance comment: posterior bias, but pt managed without AD statically                            Cognition Arousal/Alertness: Awake/alert Behavior During Therapy: WFL for tasks assessed/performed Overall Cognitive Status: Within Functional Limits for tasks assessed                                          Exercises      General Comments General comments (skin integrity, edema, etc.): VSS on RA, PLETH frequently noisy, good response to treatment      Pertinent Vitals/Pain Pain Assessment Pain Assessment: Faces Faces Pain Scale: No hurt Pain Intervention(s): Monitored during session    Home Living                          Prior Function  PT Goals (current goals can now be found in the care plan section) Acute Rehab PT Goals Patient Stated Goal: Return home; get back to exercising PT Goal Formulation: With patient Time For Goal Achievement: 06/18/22 Potential to Achieve Goals: Good Progress towards PT goals: Progressing toward goals    Frequency    Min 3X/week      PT Plan Current plan remains appropriate    Co-evaluation              AM-PAC PT "6 Clicks" Mobility   Outcome Measure  Help needed turning from your back to your side while in a flat bed without using bedrails?: None Help needed moving from lying on your back to sitting on the side of a  flat bed without using bedrails?: None Help needed moving to and from a bed to a chair (including a wheelchair)?: A Little Help needed standing up from a chair using your arms (e.g., wheelchair or bedside chair)?: A Little Help needed to walk in hospital room?: A Little Help needed climbing 3-5 steps with a railing? : A Lot 6 Click Score: 19    End of Session   Activity Tolerance: Patient tolerated treatment well;Patient limited by fatigue Patient left: in bed;with call bell/phone within reach;with family/visitor present Nurse Communication: Mobility status PT Visit Diagnosis: Unsteadiness on feet (R26.81);Difficulty in walking, not elsewhere classified (R26.2)     Time: 6168-3729 PT Time Calculation (min) (ACUTE ONLY): 25 min  Charges:  $Gait Training: 8-22 mins $Therapeutic Activity: 8-22 mins                     06/05/2022  Ginger Carne., PT Acute Rehabilitation Services [phone]  (pager) [phone]  (office)   Tessie Fass Newell Wafer 06/05/2022, 3:00 PM

## 2022-06-05 NOTE — TOC Initial Note (Signed)
Transition of Care [facility]) - Initial/Assessment Note    Patient Details  Name: Christian Dalton MRN: [id number] Date of Birth: [birth year]  [facility] CM/SW Contact:    Bethena Roys, RN Phone Number: 06/05/2022, 3:38 PM  Clinical Narrative:  Case Manager discussed disposition needs with the spouse. Plan will be to return home with home health services. Spouse did not have a preference regarding home health agency. Case Manager called [facility] and the [place] office can service the patient. MD aware to place orders for home health. DME orders placed for Wheelchair, Bedside commode- [employer] aware to deliver DME to the room prior to transition home. No further needs from Case Manager at this time.              Expected Discharge Plan: [place] Barriers to Discharge: No Barriers Identified   Patient Goals and CMS Choice Patient states their goals for this hospitalization and ongoing recovery are:: to return home. CMS Medicare.gov Compare Post Acute Care list provided to:: Patient Choice offered to / list presented to : Spouse  Expected Discharge Plan and Services Expected Discharge Plan: [place] In-house Referral: NA Discharge Planning Services: CM Consult Post Acute Care Choice: [place] arrangements for the past 2 months: Apartment                 DME Arranged: Youth worker wheelchair with seat cushion, Bedside commode DME Agency: [employer] Date DME Agency Contacted: 06/05/22 Time DME Agency Contacted: [id number] Representative spoke with at DME Agency: Thedore Mins HH Arranged: PT, OT [place] Agency: [place] Date [place]: 06/05/22 Time [place]: 1532 Representative spoke with at [place]: Tommi Rumps  Prior Living Arrangements/Services Living arrangements for the past 2 months: Apartment Lives with:: Spouse Patient language and need for interpreter reviewed:: Yes Do you feel safe going  back to the place where you live?: Yes      Need for Family Participation in Patient Care: Yes (Comment) Care giver support system in place?: Yes (comment) Current home services: DME (Patient has RW-) Criminal Activity/Legal Involvement Pertinent to Current Situation/Hospitalization: No - Comment as needed  Activities of Daily Living Home Assistive Devices/Equipment: Cane (specify quad or straight) (straight) ADL Screening (condition at time of admission) Patient's cognitive ability adequate to safely complete daily activities?: No Is the patient deaf or have difficulty hearing?: Yes Does the patient have difficulty seeing, even when wearing glasses/contacts?: No Does the patient have difficulty concentrating, remembering, or making decisions?: Yes Patient able to express need for assistance with ADLs?: Yes Does the patient have difficulty dressing or bathing?: Yes Independently performs ADLs?: No Communication: Independent Dressing (OT): Needs assistance Is this a change from baseline?: Change from baseline, expected to last <3days Grooming: Needs assistance Is this a change from baseline?: Change from baseline, expected to last <3 days Feeding: Independent Bathing: Needs assistance Is this a change from baseline?: Change from baseline, expected to last <3 days Toileting: Needs assistance Is this a change from baseline?: Change from baseline, expected to last <3 days In/Out Bed: Needs assistance Is this a change from baseline?: Change from baseline, expected to last <3 days Walks in Home: Independent with device (comment) Does the patient have difficulty walking or climbing stairs?: Yes Weakness of Legs: Both Weakness of Arms/Hands: Both  Permission Sought/Granted Permission sought to share information with : Facility Sport and exercise psychologist, Case Optician, dispensing granted to share information with : Yes, Verbal Permission  Granted     Permission granted to share info w AGENCY:  [facility], [employer]        Emotional Assessment         Alcohol / Substance Use: Not Applicable Psych Involvement: No (comment)  Admission diagnosis:  Acute blood loss anemia [D62] Traumatic perinephric hematoma of left kidney, initial encounter [S37.092A] Hypotension due to hypovolemia [I95.89, E86.1] Patient Active Problem List   Diagnosis Date Noted   Acute metabolic encephalopathy 53/66/4403   Mitral regurgitation 06/03/2022   Shortness of breath 06/03/2022   Traumatic perinephric hematoma of left kidney 06/02/2022   Sinus tachycardia 06/02/2022   Hyperkalemia 06/02/2022   Fall at home, initial encounter 06/02/2022   Thrombocytopenia ([place]) 06/02/2022   PAD (peripheral artery disease) ([place]) 06/02/2022   Gangrene of toe of left foot ([place]) 06/02/2022   COPD (chronic obstructive pulmonary disease) ([place]) 06/02/2022   HFrEF (heart failure with reduced ejection fraction) ([place]) 06/02/2022   Acute blood loss anemia 05/30/2022   Nausea 04/07/2019   Elevated troponin 04/07/2019   HLD (hyperlipidemia) 04/07/2019   Hypothyroidism 04/07/2019   Depression 04/07/2019   Type 2 diabetes mellitus with hemoglobin A1c goal of less than 7.0% (HCC)    Bile leak, postoperative 11/23/2018   Choledocholithiasis 11/11/2018   ESRD (end stage renal disease) on dialysis ([place]) 08/17/2018   Type II diabetes mellitus with renal manifestations ([place]) 08/17/2018   HTN (hypertension) 08/17/2018   CAD (coronary artery disease) 08/17/2018   Obesity, Class III, BMI 40-49.9 (morbid obesity) ([place]) 08/17/2018   PCP:  Maris Berger, MD Pharmacy:   [place], [place] [address] Phone: [phone] Fax: [place] [address] [place] [address] Phone: [phone] Fax: [phone]   Readmission Risk Interventions     No data to display

## 2022-06-05 NOTE — Procedures (Signed)
HD Note  Patient tolerated HD well with 1700 UF removed.  He was sleeping most of the treatment.  His hold time for his fistula was 15-20 min, he stated that he does have days where he bleeds longer.

## 2022-06-05 NOTE — Discharge Summary (Signed)
Physician Discharge Summary  Christian Dalton PTW:[id number] DOB: 1941/06/18 DOA: 05/30/2022  PCP: Maris Berger, MD  Admit date: 05/30/2022 Discharge date: 06/05/2022  Time spent: 40 minutes  Recommendations for Outpatient Follow-up:  Follow outpatient CBC/CMP  Follow with his cardiologist outpatient Follow with vascular surgery outpatient Iron deficiency, folate deficiency, discharging with supplementation -> follow outpatient Low normal b12, follow outpatient  Follow adrenal adenoma outpatient    Discharge Diagnoses:  Principal Problem:   Acute blood loss anemia Active Problems:   Traumatic perinephric hematoma of left kidney   Sinus tachycardia   ESRD (end stage renal disease) on dialysis ([place])   Acute metabolic encephalopathy   Shortness of breath   Hyperkalemia   HFrEF (heart failure with reduced ejection fraction) (HCC)   Mitral regurgitation   Fall at home, initial encounter   Thrombocytopenia ([place])   Type II diabetes mellitus with renal manifestations ([place])   PAD (peripheral artery disease) (HCC)   Gangrene of toe of left foot (HCC)   Hypothyroidism   COPD (chronic obstructive pulmonary disease) ([place])   Depression  Discharge Condition: stable  Diet recommendation: renal   Filed Weights   06/05/22 0450 06/05/22 0757 06/05/22 1230  Weight: 90.9 kg 91.3 kg 90.3 kg    History of present illness:  Christian Dalton is Christian Dalton 81 y.o. male with Adelyna Brockman history of ESRD (TTS), PAD, CAD, T2DM, COPD who presented to the ED on the evening of 7/7 feeling unwell, lightheaded, after Monterio Bob fall onto his left side earlier that day. Work up revealed Worsened anemia, hgb 7.5g/dl, and imaging revealed Magdaleno Lortie large left perinephric/subcapsular hematoma. He was admitted to the ICU, given 3u PRBCs, and IR performed coil embolization of 2 tertiary branches of anterior division of left renal artery with active extravasation during arteriogram.   Currently stable after his IR procedure.  Hb stable.  Planning for  resumption of aspirin at discharge.  See below for additional details  Hospital Course:  Assessment and Plan: * Acute blood loss anemia S/p 4 units pRBC (last unit 7/9 afternoon) Labs notable for iron definiciency, folate deficiency - borderline B12 (MMA pending) Supplement iron, folate, and b12 (while awaiting MMA) -> follow outpatient with PCP Will continue to trend  Traumatic perinephric hematoma of left kidney Left perinephric hematoma: s/p percutaneous coil embolization of 2 tertiary branches of the anterior division of the L renal artery supplying 2 ill defined areas of contrast extravasation involving the inferior pole of the L kidney 7/7 by IR. No evidence of GI bleeding currently. - Urology (repeat Ct abd/pelvis with little if any change of the L perinephric hematoma, the AP dimension may have increased by 1.2 cm, improved L perinephric fluid, still with coarse asymmetric L perinephric stranding, new embolization coils in L renal hilum, mild pelvic ascites with improvement, hemorrhagic content along L pelvic sidewall has almost entirely cleared, increased small pleural effusions with increased interstitial edema in the lung bases, uremic cystic disease,) and trauma surgery (now signed off) following.  - hb stable, will resume aspirin at discharge.  He was not on anticoagulation prior to admission, just aspirin.    Sinus tachycardia Not on any AV nodal blocking agents Will continue to monitor for now, adjust as needed He was not on any anticoagulation prior to admission, on review of previous tele and EKG's -> I don't see any findings c/w atrial fibrillation/flutter (discussed with telemetry as well), will continue to monitor. Follow with cards outpatient   Shortness of breath Noted 7/10, no complaints  today CXR 7/10 with bilateral interstitial thickening with cardiomegaly concerning for mild CHF vs infection Volume per renal, no signs of infection at this time See below   Acute  metabolic encephalopathy resolved Delirium precautions Will continue to monitor  ESRD (end stage renal disease) on dialysis (HCC) TTS dialysis per renal  Mitral regurgitation Per care everywhere, moderate to severe mitral regurgitation  HFrEF (heart failure with reduced ejection fraction) (HCC) EF 20-25%, undergoing workup outpatient with cards Had planned cath on 7/7, this will need to be rescheduled - discussed with Dr. Otho Perl his pending discharge and need for rescheduling of procedure  Volume per renal with dialysis    Hyperkalemia Follow per renal  resolved  Fall at home, initial encounter Therapy recommending HH  Thrombocytopenia ([place]) resolved  Type II diabetes mellitus with renal manifestations ([place]) a1c 6.4 (this may have been after transfusion, unclear) BG's ok follow  Gangrene of toe of left foot ([place]) As above  PAD (peripheral artery disease) ([place]) Last saw [name] with vascular on 6/28 Had planned for angiogram after cardiac cath on 7/7 Resume aspirin  Statin Needs to follow outpatient with vascular  COPD (chronic obstructive pulmonary disease) ([place]) No exacerbation  Hypothyroidism synthroid  Depression lexapro     Procedures: IMPRESSION: Successful percutaneous coil embolization of 2 tertiary branches of the anterior division of the left renal artery supplying 2 ill-defined areas of contrast extravasation involving the inferior pole of the left kidney.   Consultations: IR Urology PCCM Trauma surgery nephrology   Discharge Exam: Vitals:   06/05/22 1216 06/05/22 1218  BP: 119/67 119/65  Pulse: (!) 103   Resp: (!) 21   Temp:    SpO2: 92%    Seen after dialysis Wife at bedside No complaints, he's tired  General: No acute distress. Cardiovascular: RRR Lungs: unlabored Neurological: Alert and oriented 3. Moves all extremities 4 with equal strength. Cranial nerves II through XII grossly intact. Extremities: stable LE  wounds  Discharge Instructions   Discharge Instructions     Call MD for:  difficulty breathing, headache or visual disturbances   Complete by: As directed    Call MD for:  extreme fatigue   Complete by: As directed    Call MD for:  hives   Complete by: As directed    Call MD for:  persistant dizziness or light-headedness   Complete by: As directed    Call MD for:  persistant nausea and vomiting   Complete by: As directed    Call MD for:  redness, tenderness, or signs of infection (pain, swelling, redness, odor or green/yellow discharge around incision site)   Complete by: As directed    Call MD for:  severe uncontrolled pain   Complete by: As directed    Call MD for:  temperature >100.4   Complete by: As directed    Diet - low sodium heart healthy   Complete by: As directed    Discharge instructions   Complete by: As directed    You were seen after Phoenyx Melka fall and Shiryl Ruddy perinephric hematoma (bleeding around the kidney).  This improved after Sherrill Mckamie procedure with interventional radiology.  Your blood counts are stable at this time.  Please follow up your blood counts with your PCP after you resume aspirin.    You can follow up with your cardiologist as an outpatient to reschedule the cardiac catheterization.    Follow up with Dr. Donzetta Matters as an outpatient for your peripheral vascular disease and lower extremity wounds.  You have anemia.  You have iron deficiency and folate deficiency.  Your vitamin b12 was low normal and we're recommending supplementation and follow up of Jaleesa Cervi methylmalonic acid lab (pending at the time of your discharge).  Follow this up with your PCP.  There was an adrenal adenoma that needs outpatient follow up with your PCP.  Return for new, recurrent, or worsening symptoms.  Please ask your PCP to request records from this hospitalization so they know what was done and what the next steps will be.   Discharge wound care:   Complete by: As directed    Wash feet with soap  and water, rinse and dry thoroughly. Paint left great toe, right lateral foot and other scattered areas of scabbed wounds on legs with povidone iodine swabstick and allow to air-dry. Cover with dry gauze (ABD to left pretibial area) and secure left great toe with conform bandaging, other wounds with Kerlix roll gauze and paper tape.  Wound care to left forearm abrasion: Cleanse with normal saline, pat dry. Cover with Xeroform gauze, top with ABD pad and secure with Kerlix roll gauze/paper tape.   Increase activity slowly   Complete by: As directed       Allergies as of 06/05/2022       Reactions   Avelox [moxifloxacin Hcl] Other (See Comments)   Hallucinations   Gabapentin Other (See Comments)   Caused staggering and red feet   Metoprolol Other (See Comments)   Dropped pulse too low; was taken off of this by MD   Quinolones Other (See Comments)   unknown   Shellfish Allergy Nausea And Vomiting, Other (See Comments)   Made patient VERY nauseous and he developed stomach cramps   Sulfa Antibiotics Other (See Comments)   Allergy is from childhood (reaction not recalled)   Tetracyclines & Related Other (See Comments)   Blisters on hands and arms   Adhesive [tape] Itching, Rash   Paper tape only- skin cannot tolerate "heavy" tapes        Medication List     TAKE these medications    aspirin EC 81 MG tablet Take 81 mg by mouth at bedtime.   atorvastatin 10 MG tablet Commonly known as: LIPITOR Take 10 mg by mouth at bedtime.   buPROPion 150 MG 24 hr tablet Commonly known as: WELLBUTRIN XL Take 150 mg by mouth every evening.   cyanocobalamin 1000 MCG tablet Take 1 tablet (1,000 mcg total) by mouth daily.   docusate sodium 100 MG capsule Commonly known as: COLACE Take 100 mg by mouth 2 (two) times daily.   escitalopram 10 MG tablet Commonly known as: LEXAPRO Take 10 mg by mouth at bedtime.   ferrous sulfate 325 (65 FE) MG tablet Take 1 tablet (325 mg total) by mouth  every other day.   fluticasone 50 MCG/ACT nasal spray Commonly known as: FLONASE Place 1 spray into both nostrils daily as needed for allergies or rhinitis.   folic acid 1 MG tablet Commonly known as: FOLVITE Take 1 tablet (1 mg total) by mouth daily.   lactulose (encephalopathy) 10 GM/15ML Soln Commonly known as: CHRONULAC Take 10 g by mouth every evening.   levothyroxine 125 MCG tablet Commonly known as: SYNTHROID Take 125 mcg by mouth daily before breakfast.   lidocaine-prilocaine cream Commonly known as: EMLA Apply 1 application topically Every Tuesday,Thursday,and Saturday with dialysis.   linaclotide 290 MCG Caps capsule Commonly known as: LINZESS Take 290 mcg by mouth every evening.   loratadine  10 MG tablet Commonly known as: CLARITIN Take 10 mg by mouth daily.   montelukast 10 MG tablet Commonly known as: SINGULAIR Take 10 mg every evening by mouth.   nitroGLYCERIN 0.4 MG SL tablet Commonly known as: NITROSTAT Place 0.4 mg under the tongue every 5 (five) minutes as needed for chest pain.   oxyCODONE-acetaminophen 10-325 MG tablet Commonly known as: PERCOCET Take 1 tablet by mouth every 4 (four) hours as needed for pain.   ProAir HFA 108 (90 Base) MCG/ACT inhaler Generic drug: albuterol Inhale 2 puffs into the lungs every 6 (six) hours as needed for shortness of breath.   sevelamer carbonate 800 MG tablet Commonly known as: RENVELA Take 3 tablets (2,400 mg total) by mouth 3 (three) times daily with meals. What changed: when to take this   Systane Balance 0.6 % Soln Generic drug: Propylene Glycol Place 1 drop into both eyes daily as needed (dry eyes).               Durable Medical Equipment  (From admission, onward)           Start     Ordered   06/05/22 1525  For home use only DME lightweight manual wheelchair with seat cushion  Once       Comments: Patient suffers from s/p fall with large left perinephric/subcapsular hematoma which  impairs their ability to perform daily activities like bathing, dressing, in the home.  Coralynn Gaona rolling walker will not resolve  issue with performing activities of daily living. Joyleen Haselton wheelchair will allow patient to safely perform daily activities. Patient is not able to propel themselves in the home using Lacy Taglieri standard weight wheelchair due to general weakness. Patient can self propel in the lightweight wheelchair. Length of need Lifetime. Accessories: elevating leg rests (ELRs), wheel locks, extensions and anti-tippers.   06/05/22 1525   06/05/22 1520  For home use only DME Bedside commode  Once       Question:  Patient needs Deakon Frix bedside commode to treat with the following condition  Answer:  General weakness   06/05/22 1525              Discharge Care Instructions  (From admission, onward)           Start     Ordered   06/05/22 0000  Discharge wound care:       Comments: Wash feet with soap and water, rinse and dry thoroughly. Paint left great toe, right lateral foot and other scattered areas of scabbed wounds on legs with povidone iodine swabstick and allow to air-dry. Cover with dry gauze (ABD to left pretibial area) and secure left great toe with conform bandaging, other wounds with Kerlix roll gauze and paper tape.  Wound care to left forearm abrasion: Cleanse with normal saline, pat dry. Cover with Xeroform gauze, top with ABD pad and secure with Kerlix roll gauze/paper tape.   06/05/22 1615           Allergies  Allergen Reactions   Avelox [Moxifloxacin Hcl] Other (See Comments)    Hallucinations    Gabapentin Other (See Comments)    Caused staggering and red feet   Metoprolol Other (See Comments)    Dropped pulse too low; was taken off of this by MD   Quinolones Other (See Comments)    unknown   Shellfish Allergy Nausea And Vomiting and Other (See Comments)    Made patient VERY nauseous and he developed stomach cramps   Sulfa Antibiotics  Other (See Comments)    Allergy is  from childhood (reaction not recalled)   Tetracyclines & Related Other (See Comments)    Blisters on hands and arms   Adhesive [Tape] Itching and Rash    Paper tape only- skin cannot tolerate "heavy" tapes    Follow-up Information     [facility] Follow up.   Why: Wheelchair, Bedside commode. office to call with visit times Contact information: [address] [place] [address] [phone]         [facility] Follow up.   Specialty: Home Health Services Why: Physical/Occupational Therapy-office to call with visit times. Contact information: [place] [address] [phone]         Maris Berger, MD Follow up.   Specialty: Family Medicine Contact information: [address] [place] [address] [phone]         Flossie Buffy., MD .   Specialty: Cardiology Contact information: [address] [place] [address] [phone]                  The results of significant diagnostics from this hospitalization (including imaging, microbiology, ancillary and laboratory) are listed below for reference.    Significant Diagnostic Studies: CT ABDOMEN PELVIS WO CONTRAST  Result Date: 06/04/2022 CLINICAL DATA:  Traumatic left perinephric hematoma follow-up. End-stage renal disease on dialysis. EXAM: CT ABDOMEN AND PELVIS WITHOUT CONTRAST TECHNIQUE: Multidetector CT imaging of the abdomen and pelvis was performed following the standard protocol without IV contrast. RADIATION DOSE REDUCTION: This exam was performed according to the departmental dose-optimization program which includes automated exposure control, adjustment of the mA and/or kV according to patient size and/or use of iterative reconstruction technique. COMPARISON:  Study with contrast 05/30/2022. FINDINGS: Lower chest: There are small bilateral increased pleural effusions. There is increased subpleural interstitial  edema in both lung bases. Calcified granuloma noted left lower lobe with posterior atelectasis both lung bases and bilateral lower lobe bronchial thickening. There are three-vessel coronary artery calcifications cardiomegaly which was seen previously and decreased density of the intraventricular blood pool consistent with anemia. No pericardial effusion. Hepatobiliary: Cholecystectomy. Pneumobilia consistent with sphincterotomy. No biliary dilatation. Unremarkable unenhanced liver parenchyma. Pancreas: Diffusely atrophic and otherwise unremarkable. Spleen: Homogeneous noncontrast attenuation.  No splenomegaly. Adrenals/Urinary Tract: There is no adrenal mass on the right. Stable 1.5 cm left adrenal genu adenoma of 1.3 Hounsfield units. 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 Perirenal stranding is again noted on the left-greater-than-right. Small amount of scattered left perinephric fluid is again noted and improved. Embolization coils newly noted along the anterior aspect of the hilum of the left lower renal pole. Shriyans Kuenzi left perinephric hematoma is again noted. Currently this measures 14.0 cm AP, 11.7 cm transversely and 16.3 cm craniocaudal. Corresponding measurements on the prior study were 15.2 by 12.0 by 15.9 cm respectively. No evidence of urinary stones or obstruction is seen. The bladder is contracted and not well seen. Stomach/Bowel: Small hiatal hernia. No bowel obstruction or inflammatory change. Normal appendix. Left-sided diverticula noted without evidence of acute diverticulitis. Vascular/Lymphatic: Extensive vascular calcifications in the aorta and branch arteries. No AAA. No appreciable adenopathy. Reproductive: Enlarged prostate gland 5.3 cm transverse. Other: Small volume of free ascites with improvement. This is almost all in the pelvis with minimal fluid along the paracolic gutters. There is no free hemorrhage abscess or free air.  No incarcerated hernia is seen with small inguinal fat hernias again shown. Musculoskeletal: Chronic pars defects L5 with slight grade 1 L5-S1 spondylolisthesis. Osteopenia and degenerative change. There are spinal bridging syndesmophytes in the thoracic region. No destructive bone lesion is seen. IMPRESSION: 1. Little if any change of the left perinephric hematoma. The AP dimension may have increased by 1.2 cm. 2. Improved left perinephric fluid, still with coarse asymmetric left perinephric stranding. 3. New embolization coils in the left renal hilum. 4. Mild pelvic ascites with improvement. There previously was hemorrhagic content along the left pelvic sidewall which has almost entirely cleared in the interval. 5. Increased small pleural effusions with increased interstitial edema in the lung bases. Cardiomegaly. 6. Atrophic kidneys with innumerable cysts of varying density consistent with uremic cystic disease. 7. Extensive vascular calcifications coronary atherosclerosis. 8. Postcholecystectomy chronic pneumobilia. 9. Remaining findings discussed above. Electronically Signed   By: Telford Nab M.D.   On: 06/04/2022 01:01   DG CHEST PORT 1 VIEW  Result Date: 06/02/2022 CLINICAL DATA:  Acute on chronic shortness of breath EXAM: PORTABLE CHEST 1 VIEW COMPARISON:  05/30/2022 FINDINGS: Bilateral mild interstitial thickening. No focal consolidation. No pleural effusion or pneumothorax. Stable cardiomegaly. No acute osseous abnormality. IMPRESSION: 1. Bilateral interstitial thickening with cardiomegaly concerning for mild CHF versus infection. Electronically Signed   By: Kathreen Devoid M.D.   On: 06/02/2022 14:43   IR US Guide Vasc Access Right  Result Date: 05/31/2022 INDICATION: History of end-stage renal disease with fall earlier today, now with left-sided perisplenic hemorrhage and hypotension. Patient presents today for left-sided renal arteriogram and potential percutaneous embolization. EXAM: 1.  ULTRASOUND GUIDANCE FOR ARTERIAL ACCESS 2. SELECTIVE LEFT RENAL ARTERIOGRAM 3. SUB SELECTIVE ARTERIOGRAM OF THE ANTERIOR DIVISION OF THE LEFT RENAL ARTERY 4. SUB SELECTIVE PERCUTANEOUS COIL EMBOLIZATION OF TWO SEPARATE TERTIARY ARTERIES OF THE ANTERIOR DIVISION OF THE LEFT RENAL ARTERY. 5. SUB SELECTIVE ARTERIOGRAM OF THE POSTERIOR DIVISION OF THE LEFT RENAL ARTERY COMPARISON:  CT abdomen and pelvis-earlier same day MEDICATIONS: None ANESTHESIA/SEDATION: Moderate (conscious) sedation was employed during this procedure. Ivyrose Hashman total of Versed 1 mg and Fentanyl 100 mcg was administered intravenously. Moderate Sedation Time: 58 minutes. The patient's level of consciousness and vital signs were monitored continuously by radiology nursing throughout the procedure under my direct supervision. CONTRAST:  80 cc Omnipaque 300 FLUOROSCOPY TIME:  11 minutes, 12 seconds (7,106 mGy) COMPLICATIONS: None immediate. PROCEDURE: Informed consent was obtained from the patient and the patient's wife following explanation of the procedure, risks, benefits and alternatives. All questions were addressed. Shamiah Kahler time out was performed prior to the initiation of the procedure. Maximal barrier sterile technique utilized including caps, mask, sterile gowns, sterile gloves, large sterile drape, hand hygiene, and Betadine prep. The right femoral head was marked fluoroscopically. Under sterile conditions and local anesthesia, the right common femoral artery access was performed with Eluzer Howdeshell micropuncture needle. Under direct ultrasound guidance, the right common femoral was accessed with Treyden Hakim micropuncture kit. An ultrasound image was saved for documentation purposes. This allowed for placement of Dyshaun Bonzo 5-French vascular sheath. Caidynce Muzyka limited arteriogram was performed through the side arm of the sheath confirming appropriate access within the right common femoral artery. Over Teron Blais Bentson wire, Preston Garabedian Mickelson catheter was advanced the caudal aspect of the thoracic aorta where  was reformed, back bled and flushed. The Mickelson catheter was then utilized to select the left renal artery and Thena Devora selective left renal arteriogram was performed. Next, with the use of Bernardina Cacho fathom 14 microwire,  Margy Sumler cantata microcatheter was utilized to select the anterior division of the left renal artery and Tatem Holsonback selective arteriogram was performed. Microcatheter was then utilized to select Daysy Santini tertiary branch of the anterior division of the left renal artery supplying an ill-defined area of active extravasation involving the inferior pole of the left kidney. The vessel was then percutaneously coil embolized with overlapping 2 mm and 3 mm interlock coils. Microcatheter was then utilized to select Mary Hockey separate tertiary branch of the anterior division of the left renal artery supplying an additional area of ill-defined active extravasation involving the inferior pole the left kidney. The vessel was then percutaneously coil embolized with overlapping 2 mm and 3 mm interlock coils. Microcatheter was retracted to the more proximal aspect of the anterior division of the left renal artery and Bryahna Lesko selective arteriogram was performed The microcatheter was then utilized to select the posterior division of the left renal artery and Tifanie Gardiner selective arteriogram was performed The microcatheter was removed and Ronique Simerly completion arteriogram was performed via the use of the Mickelson catheter. All images were reviewed and the procedure was terminated. All wires, catheters and sheaths were removed from the patient. Hemostasis was achieved at the right groin access site with manual compression. Denia Mcvicar dressing was applied. The patient tolerated the procedure well without immediate postprocedural complication. FINDINGS: Selective left renal arteriogram demonstrates markedly sluggish flow through the left renal artery likely attributable to Parrish Daddario combination of the patient's known end-stage renal disease as well as large left-sided perinephric hematoma. Sub  selective injection of the anterior division of the left renal artery demonstrates 2 ill-defined areas of active extravasation involving the inferior pole of the left kidney, findings compatible with preceding CT. Both tertiary branches of the anterior division of the left renal artery were successfully catheterized and percutaneously coil embolized. Post embolization arteriogram was negative for areas of residual extravasation. Selective injection of the posterior division of the left renal artery and completion main left renal arteriogram were negative for additional areas of vessel irregularity or contrast extravasation. IMPRESSION: Successful percutaneous coil embolization of 2 tertiary branches of the anterior division of the left renal artery supplying 2 ill-defined areas of contrast extravasation involving the inferior pole of the left kidney. PLAN: - The patient is to remain flat for 4 hours with right leg straight. - Continued aggressive resuscitation at the discretion of the critical care service. I would not be surprised if the patient's hematocrit level has not yet reached Carlita Whitcomb nadir, however I am hopeful it will stabilize in the coming days. - Note, ultimately if additional percutaneous intervention is warranted, it would likely require Jonna Dittrich more global embolization placing the patient at risk of renal necrosis. Electronically Signed   By: Sandi Mariscal M.D.   On: 05/31/2022 11:58   IR EMBO ART  VEN HEMORR LYMPH EXTRAV  INC GUIDE ROADMAPPING  Result Date: 05/31/2022 INDICATION: History of end-stage renal disease with fall earlier today, now with left-sided perisplenic hemorrhage and hypotension. Patient presents today for left-sided renal arteriogram and potential percutaneous embolization. EXAM: 1. ULTRASOUND GUIDANCE FOR ARTERIAL ACCESS 2. SELECTIVE LEFT RENAL ARTERIOGRAM 3. SUB SELECTIVE ARTERIOGRAM OF THE ANTERIOR DIVISION OF THE LEFT RENAL ARTERY 4. SUB SELECTIVE PERCUTANEOUS COIL EMBOLIZATION OF TWO  SEPARATE TERTIARY ARTERIES OF THE ANTERIOR DIVISION OF THE LEFT RENAL ARTERY. 5. SUB SELECTIVE ARTERIOGRAM OF THE POSTERIOR DIVISION OF THE LEFT RENAL ARTERY COMPARISON:  CT abdomen and pelvis-earlier same day MEDICATIONS: None ANESTHESIA/SEDATION: Moderate (conscious) sedation was employed during this procedure. Nalleli Largent  total of Versed 1 mg and Fentanyl 100 mcg was administered intravenously. Moderate Sedation Time: 58 minutes. The patient's level of consciousness and vital signs were monitored continuously by radiology nursing throughout the procedure under my direct supervision. CONTRAST:  80 cc Omnipaque 300 FLUOROSCOPY TIME:  11 minutes, 12 seconds (2,440 mGy) COMPLICATIONS: None immediate. PROCEDURE: Informed consent was obtained from the patient and the patient's wife following explanation of the procedure, risks, benefits and alternatives. All questions were addressed. Kanda Deluna time out was performed prior to the initiation of the procedure. Maximal barrier sterile technique utilized including caps, mask, sterile gowns, sterile gloves, large sterile drape, hand hygiene, and Betadine prep. The right femoral head was marked fluoroscopically. Under sterile conditions and local anesthesia, the right common femoral artery access was performed with Doyt Castellana micropuncture needle. Under direct ultrasound guidance, the right common femoral was accessed with Seichi Kaufhold micropuncture kit. An ultrasound image was saved for documentation purposes. This allowed for placement of Frimy Uffelman 5-French vascular sheath. Kayliee Atienza limited arteriogram was performed through the side arm of the sheath confirming appropriate access within the right common femoral artery. Over Kerrick Miler Bentson wire, Derward Marple Mickelson catheter was advanced the caudal aspect of the thoracic aorta where was reformed, back bled and flushed. The Mickelson catheter was then utilized to select the left renal artery and Prathik Aman selective left renal arteriogram was performed. Next, with the use of Kayelyn Lemon fathom 14 microwire, Manie Bealer  cantata microcatheter was utilized to select the anterior division of the left renal artery and Khaliq Turay selective arteriogram was performed. Microcatheter was then utilized to select Deuce Paternoster tertiary branch of the anterior division of the left renal artery supplying an ill-defined area of active extravasation involving the inferior pole of the left kidney. The vessel was then percutaneously coil embolized with overlapping 2 mm and 3 mm interlock coils. Microcatheter was then utilized to select Lillyanna Glandon separate tertiary branch of the anterior division of the left renal artery supplying an additional area of ill-defined active extravasation involving the inferior pole the left kidney. The vessel was then percutaneously coil embolized with overlapping 2 mm and 3 mm interlock coils. Microcatheter was retracted to the more proximal aspect of the anterior division of the left renal artery and Soledad Budreau selective arteriogram was performed The microcatheter was then utilized to select the posterior division of the left renal artery and Louanne Calvillo selective arteriogram was performed The microcatheter was removed and Tyrie Porzio completion arteriogram was performed via the use of the Mickelson catheter. All images were reviewed and the procedure was terminated. All wires, catheters and sheaths were removed from the patient. Hemostasis was achieved at the right groin access site with manual compression. Shalena Ezzell dressing was applied. The patient tolerated the procedure well without immediate postprocedural complication. FINDINGS: Selective left renal arteriogram demonstrates markedly sluggish flow through the left renal artery likely attributable to Zyanya Glaza combination of the patient's known end-stage renal disease as well as large left-sided perinephric hematoma. Sub selective injection of the anterior division of the left renal artery demonstrates 2 ill-defined areas of active extravasation involving the inferior pole of the left kidney, findings compatible with preceding CT. Both  tertiary branches of the anterior division of the left renal artery were successfully catheterized and percutaneously coil embolized. Post embolization arteriogram was negative for areas of residual extravasation. Selective injection of the posterior division of the left renal artery and completion main left renal arteriogram were negative for additional areas of vessel irregularity or contrast extravasation. IMPRESSION: Successful percutaneous coil embolization of 2 tertiary  branches of the anterior division of the left renal artery supplying 2 ill-defined areas of contrast extravasation involving the inferior pole of the left kidney. PLAN: - The patient is to remain flat for 4 hours with right leg straight. - Continued aggressive resuscitation at the discretion of the critical care service. I would not be surprised if the patient's hematocrit level has not yet reached Deetya Drouillard nadir, however I am hopeful it will stabilize in the coming days. - Note, ultimately if additional percutaneous intervention is warranted, it would likely require Twylla Arceneaux more global embolization placing the patient at risk of renal necrosis. Electronically Signed   By: Sandi Mariscal M.D.   On: 05/31/2022 11:58   IR Angiogram Selective Each Additional Vessel  Result Date: 05/31/2022 INDICATION: History of end-stage renal disease with fall earlier today, now with left-sided perisplenic hemorrhage and hypotension. Patient presents today for left-sided renal arteriogram and potential percutaneous embolization. EXAM: 1. ULTRASOUND GUIDANCE FOR ARTERIAL ACCESS 2. SELECTIVE LEFT RENAL ARTERIOGRAM 3. SUB SELECTIVE ARTERIOGRAM OF THE ANTERIOR DIVISION OF THE LEFT RENAL ARTERY 4. SUB SELECTIVE PERCUTANEOUS COIL EMBOLIZATION OF TWO SEPARATE TERTIARY ARTERIES OF THE ANTERIOR DIVISION OF THE LEFT RENAL ARTERY. 5. SUB SELECTIVE ARTERIOGRAM OF THE POSTERIOR DIVISION OF THE LEFT RENAL ARTERY COMPARISON:  CT abdomen and pelvis-earlier same day MEDICATIONS: None  ANESTHESIA/SEDATION: Moderate (conscious) sedation was employed during this procedure. Romelo Sciandra total of Versed 1 mg and Fentanyl 100 mcg was administered intravenously. Moderate Sedation Time: 58 minutes. The patient's level of consciousness and vital signs were monitored continuously by radiology nursing throughout the procedure under my direct supervision. CONTRAST:  80 cc Omnipaque 300 FLUOROSCOPY TIME:  11 minutes, 12 seconds (4,166 mGy) COMPLICATIONS: None immediate. PROCEDURE: Informed consent was obtained from the patient and the patient's wife following explanation of the procedure, risks, benefits and alternatives. All questions were addressed. Natasha Paulson time out was performed prior to the initiation of the procedure. Maximal barrier sterile technique utilized including caps, mask, sterile gowns, sterile gloves, large sterile drape, hand hygiene, and Betadine prep. The right femoral head was marked fluoroscopically. Under sterile conditions and local anesthesia, the right common femoral artery access was performed with Marianna Cid micropuncture needle. Under direct ultrasound guidance, the right common femoral was accessed with Otho Michalik micropuncture kit. An ultrasound image was saved for documentation purposes. This allowed for placement of Madia Carvell 5-French vascular sheath. Erminia Mcnew limited arteriogram was performed through the side arm of the sheath confirming appropriate access within the right common femoral artery. Over Almando Brawley Bentson wire, Miraj Truss Mickelson catheter was advanced the caudal aspect of the thoracic aorta where was reformed, back bled and flushed. The Mickelson catheter was then utilized to select the left renal artery and Shaterrica Territo selective left renal arteriogram was performed. Next, with the use of Alfreddie Consalvo fathom 14 microwire, Freya Zobrist cantata microcatheter was utilized to select the anterior division of the left renal artery and Daya Dutt selective arteriogram was performed. Microcatheter was then utilized to select Peder Allums tertiary branch of the anterior division of the  left renal artery supplying an ill-defined area of active extravasation involving the inferior pole of the left kidney. The vessel was then percutaneously coil embolized with overlapping 2 mm and 3 mm interlock coils. Microcatheter was then utilized to select Skyeler Smola separate tertiary branch of the anterior division of the left renal artery supplying an additional area of ill-defined active extravasation involving the inferior pole the left kidney. The vessel was then percutaneously coil embolized with overlapping 2 mm and 3 mm interlock coils.  Microcatheter was retracted to the more proximal aspect of the anterior division of the left renal artery and Claryssa Sandner selective arteriogram was performed The microcatheter was then utilized to select the posterior division of the left renal artery and Merri Dimaano selective arteriogram was performed The microcatheter was removed and Tyrea Froberg completion arteriogram was performed via the use of the Mickelson catheter. All images were reviewed and the procedure was terminated. All wires, catheters and sheaths were removed from the patient. Hemostasis was achieved at the right groin access site with manual compression. Javaeh Muscatello dressing was applied. The patient tolerated the procedure well without immediate postprocedural complication. FINDINGS: Selective left renal arteriogram demonstrates markedly sluggish flow through the left renal artery likely attributable to Amarri Satterly combination of the patient's known end-stage renal disease as well as large left-sided perinephric hematoma. Sub selective injection of the anterior division of the left renal artery demonstrates 2 ill-defined areas of active extravasation involving the inferior pole of the left kidney, findings compatible with preceding CT. Both tertiary branches of the anterior division of the left renal artery were successfully catheterized and percutaneously coil embolized. Post embolization arteriogram was negative for areas of residual extravasation. Selective  injection of the posterior division of the left renal artery and completion main left renal arteriogram were negative for additional areas of vessel irregularity or contrast extravasation. IMPRESSION: Successful percutaneous coil embolization of 2 tertiary branches of the anterior division of the left renal artery supplying 2 ill-defined areas of contrast extravasation involving the inferior pole of the left kidney. PLAN: - The patient is to remain flat for 4 hours with right leg straight. - Continued aggressive resuscitation at the discretion of the critical care service. I would not be surprised if the patient's hematocrit level has not yet reached Sadiya Durand nadir, however I am hopeful it will stabilize in the coming days. - Note, ultimately if additional percutaneous intervention is warranted, it would likely require Sion Thane more global embolization placing the patient at risk of renal necrosis. Electronically Signed   By: Sandi Mariscal M.D.   On: 05/31/2022 11:58   IR Angiogram Selective Each Additional Vessel  Result Date: 05/31/2022 INDICATION: History of end-stage renal disease with fall earlier today, now with left-sided perisplenic hemorrhage and hypotension. Patient presents today for left-sided renal arteriogram and potential percutaneous embolization. EXAM: 1. ULTRASOUND GUIDANCE FOR ARTERIAL ACCESS 2. SELECTIVE LEFT RENAL ARTERIOGRAM 3. SUB SELECTIVE ARTERIOGRAM OF THE ANTERIOR DIVISION OF THE LEFT RENAL ARTERY 4. SUB SELECTIVE PERCUTANEOUS COIL EMBOLIZATION OF TWO SEPARATE TERTIARY ARTERIES OF THE ANTERIOR DIVISION OF THE LEFT RENAL ARTERY. 5. SUB SELECTIVE ARTERIOGRAM OF THE POSTERIOR DIVISION OF THE LEFT RENAL ARTERY COMPARISON:  CT abdomen and pelvis-earlier same day MEDICATIONS: None ANESTHESIA/SEDATION: Moderate (conscious) sedation was employed during this procedure. Jeromy Borcherding total of Versed 1 mg and Fentanyl 100 mcg was administered intravenously. Moderate Sedation Time: 58 minutes. The patient's level of  consciousness and vital signs were monitored continuously by radiology nursing throughout the procedure under my direct supervision. CONTRAST:  80 cc Omnipaque 300 FLUOROSCOPY TIME:  11 minutes, 12 seconds (9,562 mGy) COMPLICATIONS: None immediate. PROCEDURE: Informed consent was obtained from the patient and the patient's wife following explanation of the procedure, risks, benefits and alternatives. All questions were addressed. Dillion Stowers time out was performed prior to the initiation of the procedure. Maximal barrier sterile technique utilized including caps, mask, sterile gowns, sterile gloves, large sterile drape, hand hygiene, and Betadine prep. The right femoral head was marked fluoroscopically. Under sterile conditions and local anesthesia,  the right common femoral artery access was performed with Remmington Teters micropuncture needle. Under direct ultrasound guidance, the right common femoral was accessed with Irbin Fines micropuncture kit. An ultrasound image was saved for documentation purposes. This allowed for placement of Rowin Bayron 5-French vascular sheath. Martha Ellerby limited arteriogram was performed through the side arm of the sheath confirming appropriate access within the right common femoral artery. Over Stone Spirito Bentson wire, Silvie Obremski Mickelson catheter was advanced the caudal aspect of the thoracic aorta where was reformed, back bled and flushed. The Mickelson catheter was then utilized to select the left renal artery and Manasvi Dickard selective left renal arteriogram was performed. Next, with the use of Sherene Plancarte fathom 14 microwire, Ashe Gago cantata microcatheter was utilized to select the anterior division of the left renal artery and Jalyne Brodzinski selective arteriogram was performed. Microcatheter was then utilized to select Gussie Murton tertiary branch of the anterior division of the left renal artery supplying an ill-defined area of active extravasation involving the inferior pole of the left kidney. The vessel was then percutaneously coil embolized with overlapping 2 mm and 3 mm interlock coils.  Microcatheter was then utilized to select Rmoni Keplinger separate tertiary branch of the anterior division of the left renal artery supplying an additional area of ill-defined active extravasation involving the inferior pole the left kidney. The vessel was then percutaneously coil embolized with overlapping 2 mm and 3 mm interlock coils. Microcatheter was retracted to the more proximal aspect of the anterior division of the left renal artery and Delos Klich selective arteriogram was performed The microcatheter was then utilized to select the posterior division of the left renal artery and Decker Cogdell selective arteriogram was performed The microcatheter was removed and Jamir Rone completion arteriogram was performed via the use of the Mickelson catheter. All images were reviewed and the procedure was terminated. All wires, catheters and sheaths were removed from the patient. Hemostasis was achieved at the right groin access site with manual compression. Milcah Dulany dressing was applied. The patient tolerated the procedure well without immediate postprocedural complication. FINDINGS: Selective left renal arteriogram demonstrates markedly sluggish flow through the left renal artery likely attributable to Imane Burrough combination of the patient's known end-stage renal disease as well as large left-sided perinephric hematoma. Sub selective injection of the anterior division of the left renal artery demonstrates 2 ill-defined areas of active extravasation involving the inferior pole of the left kidney, findings compatible with preceding CT. Both tertiary branches of the anterior division of the left renal artery were successfully catheterized and percutaneously coil embolized. Post embolization arteriogram was negative for areas of residual extravasation. Selective injection of the posterior division of the left renal artery and completion main left renal arteriogram were negative for additional areas of vessel irregularity or contrast extravasation. IMPRESSION: Successful  percutaneous coil embolization of 2 tertiary branches of the anterior division of the left renal artery supplying 2 ill-defined areas of contrast extravasation involving the inferior pole of the left kidney. PLAN: - The patient is to remain flat for 4 hours with right leg straight. - Continued aggressive resuscitation at the discretion of the critical care service. I would not be surprised if the patient's hematocrit level has not yet reached Shelvy Heckert nadir, however I am hopeful it will stabilize in the coming days. - Note, ultimately if additional percutaneous intervention is warranted, it would likely require Alaria Oconnor more global embolization placing the patient at risk of renal necrosis. Electronically Signed   By: Sandi Mariscal M.D.   On: 05/31/2022 11:58   IR Angiogram Selective Each Additional Vessel  Result Date: 05/31/2022 INDICATION: History of end-stage renal disease with fall earlier today, now with left-sided perisplenic hemorrhage and hypotension. Patient presents today for left-sided renal arteriogram and potential percutaneous embolization. EXAM: 1. ULTRASOUND GUIDANCE FOR ARTERIAL ACCESS 2. SELECTIVE LEFT RENAL ARTERIOGRAM 3. SUB SELECTIVE ARTERIOGRAM OF THE ANTERIOR DIVISION OF THE LEFT RENAL ARTERY 4. SUB SELECTIVE PERCUTANEOUS COIL EMBOLIZATION OF TWO SEPARATE TERTIARY ARTERIES OF THE ANTERIOR DIVISION OF THE LEFT RENAL ARTERY. 5. SUB SELECTIVE ARTERIOGRAM OF THE POSTERIOR DIVISION OF THE LEFT RENAL ARTERY COMPARISON:  CT abdomen and pelvis-earlier same day MEDICATIONS: None ANESTHESIA/SEDATION: Moderate (conscious) sedation was employed during this procedure. Masyn Rostro total of Versed 1 mg and Fentanyl 100 mcg was administered intravenously. Moderate Sedation Time: 58 minutes. The patient's level of consciousness and vital signs were monitored continuously by radiology nursing throughout the procedure under my direct supervision. CONTRAST:  80 cc Omnipaque 300 FLUOROSCOPY TIME:  11 minutes, 12 seconds (6,629 mGy)  COMPLICATIONS: None immediate. PROCEDURE: Informed consent was obtained from the patient and the patient's wife following explanation of the procedure, risks, benefits and alternatives. All questions were addressed. Tailor Lucking time out was performed prior to the initiation of the procedure. Maximal barrier sterile technique utilized including caps, mask, sterile gowns, sterile gloves, large sterile drape, hand hygiene, and Betadine prep. The right femoral head was marked fluoroscopically. Under sterile conditions and local anesthesia, the right common femoral artery access was performed with Brylynn Hanssen micropuncture needle. Under direct ultrasound guidance, the right common femoral was accessed with Sueko Dimichele micropuncture kit. An ultrasound image was saved for documentation purposes. This allowed for placement of Marris Frontera 5-French vascular sheath. Markeda Narvaez limited arteriogram was performed through the side arm of the sheath confirming appropriate access within the right common femoral artery. Over Lylla Eifler Bentson wire, Railynn Ballo Mickelson catheter was advanced the caudal aspect of the thoracic aorta where was reformed, back bled and flushed. The Mickelson catheter was then utilized to select the left renal artery and Oscar Hank selective left renal arteriogram was performed. Next, with the use of Chella Chapdelaine fathom 14 microwire, Shannara Winbush cantata microcatheter was utilized to select the anterior division of the left renal artery and Kendle Erker selective arteriogram was performed. Microcatheter was then utilized to select Noal Abshier tertiary branch of the anterior division of the left renal artery supplying an ill-defined area of active extravasation involving the inferior pole of the left kidney. The vessel was then percutaneously coil embolized with overlapping 2 mm and 3 mm interlock coils. Microcatheter was then utilized to select Yaelis Scharfenberg separate tertiary branch of the anterior division of the left renal artery supplying an additional area of ill-defined active extravasation involving the inferior pole the left  kidney. The vessel was then percutaneously coil embolized with overlapping 2 mm and 3 mm interlock coils. Microcatheter was retracted to the more proximal aspect of the anterior division of the left renal artery and Antavius Sperbeck selective arteriogram was performed The microcatheter was then utilized to select the posterior division of the left renal artery and Analiese Krupka selective arteriogram was performed The microcatheter was removed and Leiana Rund completion arteriogram was performed via the use of the Mickelson catheter. All images were reviewed and the procedure was terminated. All wires, catheters and sheaths were removed from the patient. Hemostasis was achieved at the right groin access site with manual compression. Zeph Riebel dressing was applied. The patient tolerated the procedure well without immediate postprocedural complication. FINDINGS: Selective left renal arteriogram demonstrates markedly sluggish flow through the left renal artery likely attributable to Sanvi Ehler combination of the  patient's known end-stage renal disease as well as large left-sided perinephric hematoma. Sub selective injection of the anterior division of the left renal artery demonstrates 2 ill-defined areas of active extravasation involving the inferior pole of the left kidney, findings compatible with preceding CT. Both tertiary branches of the anterior division of the left renal artery were successfully catheterized and percutaneously coil embolized. Post embolization arteriogram was negative for areas of residual extravasation. Selective injection of the posterior division of the left renal artery and completion main left renal arteriogram were negative for additional areas of vessel irregularity or contrast extravasation. IMPRESSION: Successful percutaneous coil embolization of 2 tertiary branches of the anterior division of the left renal artery supplying 2 ill-defined areas of contrast extravasation involving the inferior pole of the left kidney. PLAN: - The patient is  to remain flat for 4 hours with right leg straight. - Continued aggressive resuscitation at the discretion of the critical care service. I would not be surprised if the patient's hematocrit level has not yet reached Shelvie Salsberry nadir, however I am hopeful it will stabilize in the coming days. - Note, ultimately if additional percutaneous intervention is warranted, it would likely require Creedon Danielski more global embolization placing the patient at risk of renal necrosis. Electronically Signed   By: Sandi Mariscal M.D.   On: 05/31/2022 11:58   IR Angiogram Selective Each Additional Vessel  Result Date: 05/31/2022 INDICATION: History of end-stage renal disease with fall earlier today, now with left-sided perisplenic hemorrhage and hypotension. Patient presents today for left-sided renal arteriogram and potential percutaneous embolization. EXAM: 1. ULTRASOUND GUIDANCE FOR ARTERIAL ACCESS 2. SELECTIVE LEFT RENAL ARTERIOGRAM 3. SUB SELECTIVE ARTERIOGRAM OF THE ANTERIOR DIVISION OF THE LEFT RENAL ARTERY 4. SUB SELECTIVE PERCUTANEOUS COIL EMBOLIZATION OF TWO SEPARATE TERTIARY ARTERIES OF THE ANTERIOR DIVISION OF THE LEFT RENAL ARTERY. 5. SUB SELECTIVE ARTERIOGRAM OF THE POSTERIOR DIVISION OF THE LEFT RENAL ARTERY COMPARISON:  CT abdomen and pelvis-earlier same day MEDICATIONS: None ANESTHESIA/SEDATION: Moderate (conscious) sedation was employed during this procedure. Chlora Mcbain total of Versed 1 mg and Fentanyl 100 mcg was administered intravenously. Moderate Sedation Time: 58 minutes. The patient's level of consciousness and vital signs were monitored continuously by radiology nursing throughout the procedure under my direct supervision. CONTRAST:  80 cc Omnipaque 300 FLUOROSCOPY TIME:  11 minutes, 12 seconds (2,633 mGy) COMPLICATIONS: None immediate. PROCEDURE: Informed consent was obtained from the patient and the patient's wife following explanation of the procedure, risks, benefits and alternatives. All questions were addressed. Radames Mejorado time out was  performed prior to the initiation of the procedure. Maximal barrier sterile technique utilized including caps, mask, sterile gowns, sterile gloves, large sterile drape, hand hygiene, and Betadine prep. The right femoral head was marked fluoroscopically. Under sterile conditions and local anesthesia, the right common femoral artery access was performed with Faheem Ziemann micropuncture needle. Under direct ultrasound guidance, the right common femoral was accessed with Rease Swinson micropuncture kit. An ultrasound image was saved for documentation purposes. This allowed for placement of Anakin Varkey 5-French vascular sheath. Bubber Rothert limited arteriogram was performed through the side arm of the sheath confirming appropriate access within the right common femoral artery. Over Rossie Scarfone Bentson wire, Beila Purdie Mickelson catheter was advanced the caudal aspect of the thoracic aorta where was reformed, back bled and flushed. The Mickelson catheter was then utilized to select the left renal artery and Zavannah Deblois selective left renal arteriogram was performed. Next, with the use of Terrion Poblano fathom 14 microwire, Daniele Dillow cantata microcatheter was utilized to select the anterior division of  the left renal artery and Laurian Edrington selective arteriogram was performed. Microcatheter was then utilized to select Lexxus Underhill tertiary branch of the anterior division of the left renal artery supplying an ill-defined area of active extravasation involving the inferior pole of the left kidney. The vessel was then percutaneously coil embolized with overlapping 2 mm and 3 mm interlock coils. Microcatheter was then utilized to select Kayleb Warshaw separate tertiary branch of the anterior division of the left renal artery supplying an additional area of ill-defined active extravasation involving the inferior pole the left kidney. The vessel was then percutaneously coil embolized with overlapping 2 mm and 3 mm interlock coils. Microcatheter was retracted to the more proximal aspect of the anterior division of the left renal artery and Reida Hem selective  arteriogram was performed The microcatheter was then utilized to select the posterior division of the left renal artery and Willena Jeancharles selective arteriogram was performed The microcatheter was removed and Adela Esteban completion arteriogram was performed via the use of the Mickelson catheter. All images were reviewed and the procedure was terminated. All wires, catheters and sheaths were removed from the patient. Hemostasis was achieved at the right groin access site with manual compression. Amay Mijangos dressing was applied. The patient tolerated the procedure well without immediate postprocedural complication. FINDINGS: Selective left renal arteriogram demonstrates markedly sluggish flow through the left renal artery likely attributable to Kabrina Christiano combination of the patient's known end-stage renal disease as well as large left-sided perinephric hematoma. Sub selective injection of the anterior division of the left renal artery demonstrates 2 ill-defined areas of active extravasation involving the inferior pole of the left kidney, findings compatible with preceding CT. Both tertiary branches of the anterior division of the left renal artery were successfully catheterized and percutaneously coil embolized. Post embolization arteriogram was negative for areas of residual extravasation. Selective injection of the posterior division of the left renal artery and completion main left renal arteriogram were negative for additional areas of vessel irregularity or contrast extravasation. IMPRESSION: Successful percutaneous coil embolization of 2 tertiary branches of the anterior division of the left renal artery supplying 2 ill-defined areas of contrast extravasation involving the inferior pole of the left kidney. PLAN: - The patient is to remain flat for 4 hours with right leg straight. - Continued aggressive resuscitation at the discretion of the critical care service. I would not be surprised if the patient's hematocrit level has not yet reached Daryus Sowash nadir,  however I am hopeful it will stabilize in the coming days. - Note, ultimately if additional percutaneous intervention is warranted, it would likely require Atalaya Zappia more global embolization placing the patient at risk of renal necrosis. Electronically Signed   By: Sandi Mariscal M.D.   On: 05/31/2022 11:58   IR Angiogram Selective Each Additional Vessel  Result Date: 05/31/2022 INDICATION: History of end-stage renal disease with fall earlier today, now with left-sided perisplenic hemorrhage and hypotension. Patient presents today for left-sided renal arteriogram and potential percutaneous embolization. EXAM: 1. ULTRASOUND GUIDANCE FOR ARTERIAL ACCESS 2. SELECTIVE LEFT RENAL ARTERIOGRAM 3. SUB SELECTIVE ARTERIOGRAM OF THE ANTERIOR DIVISION OF THE LEFT RENAL ARTERY 4. SUB SELECTIVE PERCUTANEOUS COIL EMBOLIZATION OF TWO SEPARATE TERTIARY ARTERIES OF THE ANTERIOR DIVISION OF THE LEFT RENAL ARTERY. 5. SUB SELECTIVE ARTERIOGRAM OF THE POSTERIOR DIVISION OF THE LEFT RENAL ARTERY COMPARISON:  CT abdomen and pelvis-earlier same day MEDICATIONS: None ANESTHESIA/SEDATION: Moderate (conscious) sedation was employed during this procedure. Rashiya Lofland total of Versed 1 mg and Fentanyl 100 mcg was administered intravenously. Moderate Sedation Time: 58 minutes.  The patient's level of consciousness and vital signs were monitored continuously by radiology nursing throughout the procedure under my direct supervision. CONTRAST:  80 cc Omnipaque 300 FLUOROSCOPY TIME:  11 minutes, 12 seconds (6,283 mGy) COMPLICATIONS: None immediate. PROCEDURE: Informed consent was obtained from the patient and the patient's wife following explanation of the procedure, risks, benefits and alternatives. All questions were addressed. Tenya Araque time out was performed prior to the initiation of the procedure. Maximal barrier sterile technique utilized including caps, mask, sterile gowns, sterile gloves, large sterile drape, hand hygiene, and Betadine prep. The right femoral head was  marked fluoroscopically. Under sterile conditions and local anesthesia, the right common femoral artery access was performed with Muhamad Serano micropuncture needle. Under direct ultrasound guidance, the right common femoral was accessed with Ruey Storer micropuncture kit. An ultrasound image was saved for documentation purposes. This allowed for placement of Farhiya Rosten 5-French vascular sheath. Britania Shreeve limited arteriogram was performed through the side arm of the sheath confirming appropriate access within the right common femoral artery. Over Galo Sayed Bentson wire, Myrian Botello Mickelson catheter was advanced the caudal aspect of the thoracic aorta where was reformed, back bled and flushed. The Mickelson catheter was then utilized to select the left renal artery and Donnabelle Blanchard selective left renal arteriogram was performed. Next, with the use of Nevin Kozuch fathom 14 microwire, Keelin Neville cantata microcatheter was utilized to select the anterior division of the left renal artery and Derelle Cockrell selective arteriogram was performed. Microcatheter was then utilized to select Unika Nazareno tertiary branch of the anterior division of the left renal artery supplying an ill-defined area of active extravasation involving the inferior pole of the left kidney. The vessel was then percutaneously coil embolized with overlapping 2 mm and 3 mm interlock coils. Microcatheter was then utilized to select Breniyah Romm separate tertiary branch of the anterior division of the left renal artery supplying an additional area of ill-defined active extravasation involving the inferior pole the left kidney. The vessel was then percutaneously coil embolized with overlapping 2 mm and 3 mm interlock coils. Microcatheter was retracted to the more proximal aspect of the anterior division of the left renal artery and Asucena Galer selective arteriogram was performed The microcatheter was then utilized to select the posterior division of the left renal artery and Delorean Knutzen selective arteriogram was performed The microcatheter was removed and Russell Quinney completion arteriogram was performed  via the use of the Mickelson catheter. All images were reviewed and the procedure was terminated. All wires, catheters and sheaths were removed from the patient. Hemostasis was achieved at the right groin access site with manual compression. Oseas Detty dressing was applied. The patient tolerated the procedure well without immediate postprocedural complication. FINDINGS: Selective left renal arteriogram demonstrates markedly sluggish flow through the left renal artery likely attributable to Hobie Kohles combination of the patient's known end-stage renal disease as well as large left-sided perinephric hematoma. Sub selective injection of the anterior division of the left renal artery demonstrates 2 ill-defined areas of active extravasation involving the inferior pole of the left kidney, findings compatible with preceding CT. Both tertiary branches of the anterior division of the left renal artery were successfully catheterized and percutaneously coil embolized. Post embolization arteriogram was negative for areas of residual extravasation. Selective injection of the posterior division of the left renal artery and completion main left renal arteriogram were negative for additional areas of vessel irregularity or contrast extravasation. IMPRESSION: Successful percutaneous coil embolization of 2 tertiary branches of the anterior division of the left renal artery supplying 2 ill-defined areas of contrast extravasation  involving the inferior pole of the left kidney. PLAN: - The patient is to remain flat for 4 hours with right leg straight. - Continued aggressive resuscitation at the discretion of the critical care service. I would not be surprised if the patient's hematocrit level has not yet reached Robie Mcniel nadir, however I am hopeful it will stabilize in the coming days. - Note, ultimately if additional percutaneous intervention is warranted, it would likely require Rye Dorado more global embolization placing the patient at risk of renal necrosis.  Electronically Signed   By: Sandi Mariscal M.D.   On: 05/31/2022 11:58   IR Angiogram Selective Each Additional Vessel  Result Date: 05/31/2022 INDICATION: History of end-stage renal disease with fall earlier today, now with left-sided perisplenic hemorrhage and hypotension. Patient presents today for left-sided renal arteriogram and potential percutaneous embolization. EXAM: 1. ULTRASOUND GUIDANCE FOR ARTERIAL ACCESS 2. SELECTIVE LEFT RENAL ARTERIOGRAM 3. SUB SELECTIVE ARTERIOGRAM OF THE ANTERIOR DIVISION OF THE LEFT RENAL ARTERY 4. SUB SELECTIVE PERCUTANEOUS COIL EMBOLIZATION OF TWO SEPARATE TERTIARY ARTERIES OF THE ANTERIOR DIVISION OF THE LEFT RENAL ARTERY. 5. SUB SELECTIVE ARTERIOGRAM OF THE POSTERIOR DIVISION OF THE LEFT RENAL ARTERY COMPARISON:  CT abdomen and pelvis-earlier same day MEDICATIONS: None ANESTHESIA/SEDATION: Moderate (conscious) sedation was employed during this procedure. Kalli Greenfield total of Versed 1 mg and Fentanyl 100 mcg was administered intravenously. Moderate Sedation Time: 58 minutes. The patient's level of consciousness and vital signs were monitored continuously by radiology nursing throughout the procedure under my direct supervision. CONTRAST:  80 cc Omnipaque 300 FLUOROSCOPY TIME:  11 minutes, 12 seconds (4,098 mGy) COMPLICATIONS: None immediate. PROCEDURE: Informed consent was obtained from the patient and the patient's wife following explanation of the procedure, risks, benefits and alternatives. All questions were addressed. Jamone Garrido time out was performed prior to the initiation of the procedure. Maximal barrier sterile technique utilized including caps, mask, sterile gowns, sterile gloves, large sterile drape, hand hygiene, and Betadine prep. The right femoral head was marked fluoroscopically. Under sterile conditions and local anesthesia, the right common femoral artery access was performed with Malvina Schadler micropuncture needle. Under direct ultrasound guidance, the right common femoral was accessed  with Demitrius Crass micropuncture kit. An ultrasound image was saved for documentation purposes. This allowed for placement of Lelah Rennaker 5-French vascular sheath. Airik Goodlin limited arteriogram was performed through the side arm of the sheath confirming appropriate access within the right common femoral artery. Over Cammi Consalvo Bentson wire, Eliel Dudding Mickelson catheter was advanced the caudal aspect of the thoracic aorta where was reformed, back bled and flushed. The Mickelson catheter was then utilized to select the left renal artery and Maral Lampe selective left renal arteriogram was performed. Next, with the use of Eryx Zane fathom 14 microwire, Margy Sumler cantata microcatheter was utilized to select the anterior division of the left renal artery and Ennifer Harston selective arteriogram was performed. Microcatheter was then utilized to select Brett Darko tertiary branch of the anterior division of the left renal artery supplying an ill-defined area of active extravasation involving the inferior pole of the left kidney. The vessel was then percutaneously coil embolized with overlapping 2 mm and 3 mm interlock coils. Microcatheter was then utilized to select Cherisa Brucker separate tertiary branch of the anterior division of the left renal artery supplying an additional area of ill-defined active extravasation involving the inferior pole the left kidney. The vessel was then percutaneously coil embolized with overlapping 2 mm and 3 mm interlock coils. Microcatheter was retracted to the more proximal aspect of the anterior division of the left renal artery  and Charmane Protzman selective arteriogram was performed The microcatheter was then utilized to select the posterior division of the left renal artery and Roshunda Keir selective arteriogram was performed The microcatheter was removed and Wanetta Funderburke completion arteriogram was performed via the use of the Mickelson catheter. All images were reviewed and the procedure was terminated. All wires, catheters and sheaths were removed from the patient. Hemostasis was achieved at the right groin access site with  manual compression. Ayrton Mcvay dressing was applied. The patient tolerated the procedure well without immediate postprocedural complication. FINDINGS: Selective left renal arteriogram demonstrates markedly sluggish flow through the left renal artery likely attributable to Jodeci Roarty combination of the patient's known end-stage renal disease as well as large left-sided perinephric hematoma. Sub selective injection of the anterior division of the left renal artery demonstrates 2 ill-defined areas of active extravasation involving the inferior pole of the left kidney, findings compatible with preceding CT. Both tertiary branches of the anterior division of the left renal artery were successfully catheterized and percutaneously coil embolized. Post embolization arteriogram was negative for areas of residual extravasation. Selective injection of the posterior division of the left renal artery and completion main left renal arteriogram were negative for additional areas of vessel irregularity or contrast extravasation. IMPRESSION: Successful percutaneous coil embolization of 2 tertiary branches of the anterior division of the left renal artery supplying 2 ill-defined areas of contrast extravasation involving the inferior pole of the left kidney. PLAN: - The patient is to remain flat for 4 hours with right leg straight. - Continued aggressive resuscitation at the discretion of the critical care service. I would not be surprised if the patient's hematocrit level has not yet reached Geoffrey Hynes nadir, however I am hopeful it will stabilize in the coming days. - Note, ultimately if additional percutaneous intervention is warranted, it would likely require Keylee Shrestha more global embolization placing the patient at risk of renal necrosis. Electronically Signed   By: Sandi Mariscal M.D.   On: 05/31/2022 11:58   DG Knee Left Port  Result Date: 05/30/2022 CLINICAL DATA:  Fall EXAM: PORTABLE LEFT KNEE - 1-2 VIEW COMPARISON:  None Available. FINDINGS: No acute fracture  or dislocation of the left knee. Plate and screw fixation of the lateral tibial plateau with underlying, chronic, sclerotic fracture. Joint spaces are well preserved. No knee joint effusion. Vascular calcinosis. IMPRESSION: No acute fracture or dislocation of the left knee. Plate and screw fixation of the lateral tibial plateau with underlying, chronic, sclerotic fracture. Electronically Signed   By: Delanna Ahmadi M.D.   On: 05/30/2022 16:40   IR Angiogram Visceral Selective  INDICATION: History of end-stage renal disease with fall earlier today, now with left-sided perisplenic hemorrhage and hypotension. Patient presents today for left-sided renal arteriogram and potential percutaneous embolization.   EXAM: 1. ULTRASOUND GUIDANCE FOR ARTERIAL ACCESS 2. SELECTIVE LEFT RENAL ARTERIOGRAM 3. SUB SELECTIVE ARTERIOGRAM OF THE ANTERIOR DIVISION OF THE LEFT RENAL ARTERY 4. SUB SELECTIVE PERCUTANEOUS COIL EMBOLIZATION OF TWO SEPARATE TERTIARY ARTERIES OF THE ANTERIOR DIVISION OF THE LEFT RENAL ARTERY. 5. SUB SELECTIVE ARTERIOGRAM OF THE POSTERIOR DIVISION OF THE LEFT RENAL ARTERY   COMPARISON:  CT abdomen and pelvis-earlier same day   MEDICATIONS: None   ANESTHESIA/SEDATION: Moderate (conscious) sedation was employed during this procedure. Oktober Glazer total of Versed 1 mg and Fentanyl 100 mcg was administered intravenously.   Moderate Sedation Time: 58 minutes. The patient's level of consciousness and vital signs were monitored continuously by radiology nursing throughout the procedure under my direct supervision.  CONTRAST:  80 cc Omnipaque 300   FLUOROSCOPY TIME:  11 minutes, 12 seconds (6,759 mGy)   COMPLICATIONS: None immediate.   PROCEDURE: Informed consent was obtained from the patient and the patient's wife following explanation of the procedure, risks, benefits and alternatives. All questions were addressed. Charleston Hankin time out was performed prior to the initiation of the procedure. Maximal barrier sterile technique utilized  including caps, mask, sterile gowns, sterile gloves, large sterile drape, hand hygiene, and Betadine prep.   The right femoral head was marked fluoroscopically. Under sterile conditions and local anesthesia, the right common femoral artery access was performed with Papa Piercefield micropuncture needle. Under direct ultrasound guidance, the right common femoral was accessed with Curtez Brallier micropuncture kit. An ultrasound image was saved for documentation purposes. This allowed for placement of Vasilia Dise 5-French vascular sheath. Yoav Okane limited arteriogram was performed through the side arm of the sheath confirming appropriate access within the right common femoral artery.   Over Avraham Benish Bentson wire, Tobin Witucki Mickelson catheter was advanced the caudal aspect of the thoracic aorta where was reformed, back bled and flushed.   The Mickelson catheter was then utilized to select the left renal artery and Quintina Hakeem selective left renal arteriogram was performed. Next, with the use of Mikaylee Arseneau fathom 14 microwire, Dasiah Hooley cantata microcatheter was utilized to select the anterior division of the left renal artery and Gianah Batt selective arteriogram was performed.   Microcatheter was then utilized to select Dijuan Sleeth tertiary branch of the anterior division of the left renal artery supplying an ill-defined area of active extravasation involving the inferior pole of the left kidney. The vessel was then percutaneously coil embolized with overlapping 2 mm and 3 mm interlock coils.   Microcatheter was then utilized to select Danie Hannig separate tertiary branch of the anterior division of the left renal artery supplying an additional area of ill-defined active extravasation involving the inferior pole the left kidney. The vessel was then percutaneously coil embolized with overlapping 2 mm and 3 mm interlock coils.   Microcatheter was retracted to the more proximal aspect of the anterior division of the left renal artery and Zanasia Hickson selective arteriogram was performed   The microcatheter was then utilized to select the posterior  division of the left renal artery and Reve Crocket selective arteriogram was performed   The microcatheter was removed and Frantz Quattrone completion arteriogram was performed via the use of the Mickelson catheter.   All images were reviewed and the procedure was terminated. All wires, catheters and sheaths were removed from the patient. Hemostasis was achieved at the right groin access site with manual compression. Collyn Ribas dressing was applied. The patient tolerated the procedure well without immediate postprocedural complication.   FINDINGS: Selective left renal arteriogram demonstrates markedly sluggish flow through the left renal artery likely attributable to Bexley Mclester combination of the patient's known end-stage renal disease as well as large left-sided perinephric hematoma.   Sub selective injection of the anterior division of the left renal artery demonstrates 2 ill-defined areas of active extravasation involving the inferior pole of the left kidney, findings compatible with preceding CT. Both tertiary branches of the anterior division of the left renal artery were successfully catheterized and percutaneously coil embolized. Post embolization arteriogram was negative for areas of residual extravasation.   Selective injection of the posterior division of the left renal artery and completion main left renal arteriogram were negative for additional areas of vessel irregularity or contrast extravasation.   IMPRESSION: Successful percutaneous coil embolization of 2 tertiary branches of the anterior division of  the left renal artery supplying 2 ill-defined areas of contrast extravasation involving the inferior pole of the left kidney.   PLAN: - The patient is to remain flat for 4 hours with right leg straight.   - Continued aggressive resuscitation at the discretion of the critical care service. I would not be surprised if the patient's hematocrit level has not yet reached Amen Dargis nadir, however I am hopeful it will stabilize in the coming days.   - Note,  ultimately if additional percutaneous intervention is warranted, it would likely require Sharay Bellissimo more global embolization placing the patient at risk of renal necrosis.     Electronically Signed   By: Sandi Mariscal M.D.   On: 05/31/2022 11:58  CT ABDOMEN PELVIS W CONTRAST  Result Date: 05/30/2022 CLINICAL DATA:  Nonlocalized abdominal pain. EXAM: CT ABDOMEN AND PELVIS WITH CONTRAST TECHNIQUE: Multidetector CT imaging of the abdomen and pelvis was performed using the standard protocol following bolus administration of intravenous contrast. RADIATION DOSE REDUCTION: This exam was performed according to the departmental dose-optimization program which includes automated exposure control, adjustment of the mA and/or kV according to patient size and/or use of iterative reconstruction technique. CONTRAST:  154m OMNIPAQUE IOHEXOL 350 MG/ML SOLN COMPARISON:  Abdomen/pelvis CT 04/07/2019 FINDINGS: Lower chest: Please see report for chest CTA performed at the same time. Hepatobiliary: No suspicious focal abnormality within the liver parenchyma. Gallbladder is surgically absent. Pneumobilia suggests prior sphincterotomy. Pancreas: No focal mass lesion. No dilatation of the main duct. No intraparenchymal cyst. No peripancreatic edema. Spleen: No splenomegaly. No focal mass lesion. Adrenals/Urinary Tract: Stable 14 mm left adrenal nodule consistent with adenoma. No followup recommended. Right adrenal gland unremarkable. Innumerable small cystic lesions identified in the right kidney, the more [place] which are too small to characterize on this exam. Innumerable cystic foci are seen in the left kidney with Raneen Jaffer large perinephric heterogeneous collection compatible with hematoma. Hematoma is probably subcapsular and measures 14.9 x 6.4 x 17.3 cm. Foci of increased attenuation in the interpolar left kidney between the parenchyma and the hematoma (axial 46/1 and coronal 87/6) raise the question of active extravasation. There is  stranding around the left kidney compatible with edema/hemorrhage. Hemorrhage extends down the retroperitoneum into the left pelvic sidewall. Hematoma generates mass-effect on the left kidney. No hydroureteronephrosis. Bladder is decompressed. Stomach/Bowel: Tiny hiatal hernia. Stomach otherwise unremarkable. Duodenum is normally positioned as is the ligament of Treitz. No small bowel wall thickening. No small bowel dilatation. The terminal ileum is normal. The appendix is normal. No gross colonic mass. No colonic wall thickening. Vascular/Lymphatic: There is advanced atherosclerotic calcification of the abdominal aorta without aneurysm. There is no gastrohepatic or hepatoduodenal ligament lymphadenopathy. No retroperitoneal or mesenteric lymphadenopathy. No pelvic sidewall lymphadenopathy. Reproductive: The prostate gland and seminal vesicles are unremarkable. Other: No substantial intraperitoneal free fluid. Musculoskeletal: No worrisome lytic or sclerotic osseous abnormality. Bilateral pars interarticularis defects noted at L5. IMPRESSION: 1. Markedly large left perinephric/subcapsular hematoma involving the left kidney. There are adjacent foci of ill-defined increased attenuation at the margin of the kidney and the hematoma raising concern for active or ongoing bleeding. Hemorrhage extends in the retroperitoneal tissues inferiorly to the extraperitoneal left pelvic sidewall. Follow-up recommended to exclude hemorrhage secondary to underlying mass lesion. 2. Innumerable tiny cystic lesions in both kidneys, too small to characterize and progressive in the interval since 2020. 3. Pneumobilia suggests prior sphincterotomy. 4. 14 mm left adrenal adenoma. Critical Value/emergent results were called by telephone at the time of interpretation  on 05/30/2022 at 12:14 pm to provider [facility] , who verbally acknowledged these results. Electronically Signed   By: Misty Stanley M.D.   On: 05/30/2022 12:15   CT Angio Chest  PE W and/or Wo Contrast  Result Date: 05/30/2022 CLINICAL DATA:  Nonlocalized abdominal pain. EXAM: CT ABDOMEN AND PELVIS WITH CONTRAST TECHNIQUE: Multidetector CT imaging of the abdomen and pelvis was performed using the standard protocol following bolus administration of intravenous contrast. RADIATION DOSE REDUCTION: This exam was performed according to the departmental dose-optimization program which includes automated exposure control, adjustment of the mA and/or kV according to patient size and/or use of iterative reconstruction technique. CONTRAST:  162m OMNIPAQUE IOHEXOL 350 MG/ML SOLN COMPARISON:  Abdomen/pelvis CT 04/07/2019 FINDINGS: Lower chest: Please see report for chest CTA performed at the same time. Hepatobiliary: No suspicious focal abnormality within the liver parenchyma. Gallbladder is surgically absent. Pneumobilia suggests prior sphincterotomy. Pancreas: No focal mass lesion. No dilatation of the main duct. No intraparenchymal cyst. No peripancreatic edema. Spleen: No splenomegaly. No focal mass lesion. Adrenals/Urinary Tract: Stable 14 mm left adrenal nodule consistent with adenoma. No followup recommended. Right adrenal gland unremarkable. Innumerable small cystic lesions identified in the right kidney, the more [place] which are too small to characterize on this exam. Innumerable cystic foci are seen in the left kidney with Hendryx Ricke large perinephric heterogeneous collection compatible with hematoma. Hematoma is probably subcapsular and measures 14.9 x 6.4 x 17.3 cm. Foci of increased attenuation in the interpolar left kidney between the parenchyma and the hematoma (axial 46/1 and coronal 87/6) raise the question of active extravasation. There is stranding around the left kidney compatible with edema/hemorrhage. Hemorrhage extends down the retroperitoneum into the left pelvic sidewall. Hematoma generates mass-effect on the left kidney. No hydroureteronephrosis. Bladder is decompressed.  Stomach/Bowel: Tiny hiatal hernia. Stomach otherwise unremarkable. Duodenum is normally positioned as is the ligament of Treitz. No small bowel wall thickening. No small bowel dilatation. The terminal ileum is normal. The appendix is normal. No gross colonic mass. No colonic wall thickening. Vascular/Lymphatic: There is advanced atherosclerotic calcification of the abdominal aorta without aneurysm. There is no gastrohepatic or hepatoduodenal ligament lymphadenopathy. No retroperitoneal or mesenteric lymphadenopathy. No pelvic sidewall lymphadenopathy. Reproductive: The prostate gland and seminal vesicles are unremarkable. Other: No substantial intraperitoneal free fluid. Musculoskeletal: No worrisome lytic or sclerotic osseous abnormality. Bilateral pars interarticularis defects noted at L5. IMPRESSION: 1. Markedly large left perinephric/subcapsular hematoma involving the left kidney. There are adjacent foci of ill-defined increased attenuation at the margin of the kidney and the hematoma raising concern for active or ongoing bleeding. Hemorrhage extends in the retroperitoneal tissues inferiorly to the extraperitoneal left pelvic sidewall. Follow-up recommended to exclude hemorrhage secondary to underlying mass lesion. 2. Innumerable tiny cystic lesions in both kidneys, too small to characterize and progressive in the interval since 2020. 3. Pneumobilia suggests prior sphincterotomy. 4. 14 mm left adrenal adenoma. Critical Value/emergent results were called by telephone at the time of interpretation on 05/30/2022 at 12:14 pm [facility] who verbally acknowledged these results. Electronically Signed   By: EMisty StanleyM.D.   On: 05/30/2022 12:15   DG Chest 1 View  Result Date: 05/30/2022 CLINICAL DATA:  Questionable sepsis. EXAM: CHEST  1 VIEW COMPARISON:  01/02/2022 FINDINGS: Stable cardiomediastinal contours. Lung volumes are low. No pleural effusion or edema. No airspace opacities identified.  Visualized osseous structures appear intact. IMPRESSION: Low lung volumes. Electronically Signed   By: TQueen SloughD.  On: 05/30/2022 11:53   VAS [place] ABI WITH/WO TBI  Result Date: 05/21/2022  LOWER EXTREMITY DOPPLER STUDY Patient Name:  Christian Dalton  Date of Exam:   05/21/2022 Medical Rec #: [id number]     Accession #:    [id number] Date of Birth: June 27, 1941     Patient Gender: M Patient Age:   81 years Exam Location:  [facility] Procedure:      VAS [place] ABI WITH/WO TBI Referring Phys: Aldona Bar RHYNE --------------------------------------------------------------------------------  Indications: Ulceration, and peripheral artery disease. High Risk Factors: Hypertension, Diabetes, coronary artery disease.  Vascular Interventions: 01/15/2021: Balloon angioplasty of left posterior tibial                         artery.                          12/11/2020: Angioplasty of right peroneal artery. Failed                         retrograde angioplasty of right anterior tibial artery. Comparison Study: 12/30/2021: Rt ABI [employer]; Lt ABI [place] Performing Technologist: Ivan Croft  Examination Guidelines: Yesica Kemler complete evaluation includes at minimum, Doppler waveform signals and systolic blood pressure reading at the level of bilateral brachial, anterior tibial, and posterior tibial arteries, when vessel segments are accessible. Bilateral testing is considered an integral part of Jet Armbrust complete examination. Photoelectric Plethysmograph (PPG) waveforms and toe systolic pressure readings are included as required and additional duplex testing as needed. Limited examinations for reoccurring indications may be performed as noted.  ABI Findings: +---------+------------------+-----+----------+--------+ Right    Rt Pressure (mmHg)IndexWaveform  Comment  +---------+------------------+-----+----------+--------+ Brachial 133                                       +---------+------------------+-----+----------+--------+  PTA      [place]                     biphasic           +---------+------------------+-----+----------+--------+ DP       [place]                     monophasic         +---------+------------------+-----+----------+--------+ Great Toe31                0.23                    +---------+------------------+-----+----------+--------+ +---------+------------------+-----+---------+---------+ Left     Lt Pressure (mmHg)IndexWaveform Comment   +---------+------------------+-----+---------+---------+ Brachial                                 HD access +---------+------------------+-----+---------+---------+ PTA      [place]                     triphasic          +---------+------------------+-----+---------+---------+ DP       [place]                     biphasic           +---------+------------------+-----+---------+---------+ Arliss Journey  0.16                    +---------+------------------+-----+---------+---------+ +-------+-----------+-----------+------------+------------+ ABI/TBIToday's ABIToday's TBIPrevious ABIPrevious TBI +-------+-----------+-----------+------------+------------+ Right  [place]         0.23                                +-------+-----------+-----------+------------+------------+ Left   [place]         0.16                                +-------+-----------+-----------+------------+------------+   Summary: Right: Resting right ankle-brachial index indicates noncompressible right lower extremity arteries. The right toe-brachial index is abnormal. Left: Resting left ankle-brachial index indicates noncompressible left lower extremity arteries. The left toe-brachial index is abnormal. *See table(s) above for measurements and observations.  Electronically signed by Servando Snare MD on 05/21/2022 at 1:58:44 PM.    Final     Microbiology: Recent Results (from the past 240 hour(s))  Blood Culture (routine x 2)     Status: None   Collection Time:  05/30/22 11:00 AM   Specimen: BLOOD  Result Value Ref Range Status   Specimen Description BLOOD SITE NOT SPECIFIED  Final   Special Requests   Final    BOTTLES DRAWN AEROBIC AND ANAEROBIC Blood Culture adequate volume   Culture   Final    NO GROWTH 5 DAYS Performed at [facility], [address], [place], [place] [address]    Report Status 06/04/2022 FINAL  Final  MRSA Next Gen by PCR, Nasal     Status: None   Collection Time: 05/30/22  1:32 PM   Specimen: Nasal Mucosa; Nasal Swab  Result Value Ref Range Status   MRSA by PCR Next Gen NOT DETECTED NOT DETECTED Final    Comment: (NOTE) The GeneXpert MRSA Assay (FDA approved for NASAL specimens only), is one component of Sharan Mcenaney comprehensive MRSA colonization surveillance program. It is not intended to diagnose MRSA infection nor to guide or monitor treatment for MRSA infections. Test performance is not FDA approved in patients less than 52 years old. Performed at [facility], [place] [address] [place], [place] [address]   Blood Culture (routine x 2)     Status: None   Collection Time: 05/30/22 11:03 PM   Specimen: BLOOD  Result Value Ref Range Status   Specimen Description BLOOD RIGHT ANTECUBITAL  Final   Special Requests   Final    BOTTLES DRAWN AEROBIC AND ANAEROBIC Blood Culture adequate volume   Culture   Final    NO GROWTH 5 DAYS Performed at [facility], [place] [address], [place],  [address]    Report Status 06/04/2022 FINAL  Final     Labs: Basic Metabolic Panel: Recent Labs  Lab 05/31/22 0235 05/31/22 1009 06/01/22 0210 06/02/22 0014 06/03/22 0409 06/04/22 0339 06/05/22 0224  NA 135   < > 133* 133* 131* 132* 131*  K 5.5*   < > 4.5 4.7 5.1 4.2 4.3  CL 93*   < > 92* 89* 92* 92* 93*  CO2 24   < > '25 24 23 26 23  ' GLUCOSE 175*   < > 153* 154* 98 102* 112*  BUN 52*   < > 40* 58* 79* 47* 62*  CREATININE 9.01*   < > 6.74* 7.77* 8.89* 6.16* 7.06*  CALCIUM 8.7*   < > 8.8* 8.8* 8.5* 8.4* 8.5*   MG  --   --   --   --  2.2 2.0 2.1  PHOS 7.4*  --   --   --  7.4* 5.2* 6.2*   < > = values in this interval not displayed.   Liver Function Tests: Recent Labs  Lab 05/30/22 1051 05/31/22 0235 06/03/22 0409 06/04/22 0339  AST 12*  --  7* 10*  ALT 9  --  11 13  ALKPHOS 70  --  86 109  BILITOT 0.6  --  0.6 0.8  PROT 4.9*  --  5.2* 5.1*  ALBUMIN 2.7* 2.5* 2.5* 2.4*   Recent Labs  Lab 05/30/22 1051  LIPASE 18   No results for input(s): "AMMONIA" in the last 168 hours. CBC: Recent Labs  Lab 05/30/22 1051 05/30/22 1107 06/02/22 0014 06/02/22 1031 06/03/22 0409 06/04/22 0339 06/05/22 0224  WBC 9.1   < > 9.0 10.0 8.4 6.9 8.3  NEUTROABS 7.8*  --   --   --  6.8 5.4 6.4  HGB 7.5*   < > 8.2* 8.9* 8.2* 8.0* 8.3*  HCT 24.7*   < > 25.1* 26.7* 25.4* 24.1* 25.5*  MCV 97.2   < > 91.3 90.5 93.0 90.9 91.7  PLT 225   < > 177 202 210 203 244   < > = values in this interval not displayed.   Cardiac Enzymes: No results for input(s): "CKTOTAL", "CKMB", "CKMBINDEX", "TROPONINI" in the last 168 hours. BNP: BNP (last 3 results) No results for input(s): "BNP" in the last 8760 hours.  ProBNP (last 3 results) No results for input(s): "PROBNP" in the last 8760 hours.  CBG: Recent Labs  Lab 06/04/22 2015 06/05/22 0004 06/05/22 0447 06/05/22 0708 06/05/22 1317  GLUCAP 161* 116* 104* 94 90       Signed:  Fayrene Helper MD.  [facility] 06/05/2022, 4:27 PM

## 2022-06-06 NOTE — Progress Notes (Signed)
Late Entry Note:  Pt was a late d/c yesterday afternoon. Contacted Christian Dalton this am to advise clinic of pt's d/c yesterday and pt should resume care tomorrow.   [name] Renal Navigator [phone]

## 2022-06-07 ENCOUNTER — Telehealth: Payer: Self-pay | Admitting: Nephrology

## 2022-06-07 NOTE — Telephone Encounter (Signed)
Transition of care contact from inpatient facility  Date of discharge: 06/05/22 Date of contact: 06/07/22 Method: Phone Spoke to: Patient' s wife  no ???  Patient contacted to discuss transition of care from recent inpatient hospitalization. Patient was admitted to [facility] from 05/30/22 -06/05/22... 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  Patient will follow up with his/her outpatient HD unit on:

## 2022-06-09 ENCOUNTER — Ambulatory Visit (HOSPITAL_COMMUNITY): Admission: RE | Admit: 2022-06-09 | Payer: Medicare Other | Source: Ambulatory Visit | Admitting: Vascular Surgery

## 2022-06-09 ENCOUNTER — Encounter (HOSPITAL_COMMUNITY): Admission: RE | Payer: Self-pay | Source: Ambulatory Visit

## 2022-06-09 SURGERY — ABDOMINAL AORTOGRAM W/LOWER EXTREMITY
Anesthesia: LOCAL

## 2022-06-14 ENCOUNTER — Telehealth: Payer: Self-pay | Admitting: Urology

## 2022-06-14 NOTE — Telephone Encounter (Signed)
Returned patient call. Recent had renal bleed 2/2 fall that was embolized and subsequently discharged. Now having some left sided abdominal pain, primarily in front as opposed to flank. Denies other associated symptoms other than low activity level which has been persistent since discharge. I advised them that if they were concerned the safest option would be to come to the emergency department for evaluation, however in the absence of other symptoms I am reassured that this is unlikely to be a rebleed of his kidney. No ecchymosis, no issues with PO intake. I explained that evaluation over the phone was limited and that they should use their best judgement regarding seeking urgent vs less urgent evaluation. Patient and wife expressed understanding and they were appreciative of the call. All questions were answered.

## 2022-06-17 ENCOUNTER — Emergency Department (HOSPITAL_COMMUNITY)
Admission: EM | Admit: 2022-06-17 | Discharge: 2022-06-18 | Disposition: A | Discharge status: Home / Self Care | Payer: Medicare Other | Attending: Emergency Medicine | Admitting: Emergency Medicine

## 2022-06-17 ENCOUNTER — Other Ambulatory Visit: Payer: Self-pay

## 2022-06-17 ENCOUNTER — Encounter (HOSPITAL_COMMUNITY): Payer: Self-pay | Admitting: Emergency Medicine

## 2022-06-17 DIAGNOSIS — I120 Hypertensive chronic kidney disease with stage 5 chronic kidney disease or end stage renal disease: Secondary | ICD-10-CM | POA: Diagnosis not present

## 2022-06-17 DIAGNOSIS — Z7982 Long term (current) use of aspirin: Secondary | ICD-10-CM | POA: Diagnosis not present

## 2022-06-17 DIAGNOSIS — I2510 Atherosclerotic heart disease of native coronary artery without angina pectoris: Secondary | ICD-10-CM | POA: Insufficient documentation

## 2022-06-17 DIAGNOSIS — E119 Type 2 diabetes mellitus without complications: Secondary | ICD-10-CM | POA: Diagnosis not present

## 2022-06-17 DIAGNOSIS — W19XXXA Unspecified fall, initial encounter: Secondary | ICD-10-CM | POA: Insufficient documentation

## 2022-06-17 DIAGNOSIS — Z79899 Other long term (current) drug therapy: Secondary | ICD-10-CM | POA: Diagnosis not present

## 2022-06-17 DIAGNOSIS — E039 Hypothyroidism, unspecified: Secondary | ICD-10-CM | POA: Insufficient documentation

## 2022-06-17 DIAGNOSIS — R319 Hematuria, unspecified: Secondary | ICD-10-CM

## 2022-06-17 DIAGNOSIS — N186 End stage renal disease: Secondary | ICD-10-CM

## 2022-06-17 DIAGNOSIS — Z992 Dependence on renal dialysis: Secondary | ICD-10-CM | POA: Insufficient documentation

## 2022-06-17 DIAGNOSIS — J449 Chronic obstructive pulmonary disease, unspecified: Secondary | ICD-10-CM | POA: Diagnosis not present

## 2022-06-17 DIAGNOSIS — N3000 Acute cystitis without hematuria: Secondary | ICD-10-CM

## 2022-06-17 LAB — COMPREHENSIVE METABOLIC PANEL WITH GFR
ALT: 8 U/L (ref 0–44)
AST: 6 U/L — ABNORMAL LOW (ref 15–41)
Albumin: 2.6 g/dL — ABNORMAL LOW (ref 3.5–5.0)
Alkaline Phosphatase: 198 U/L — ABNORMAL HIGH (ref 38–126)
Anion gap: 11 (ref 5–15)
BUN: 35 mg/dL — ABNORMAL HIGH (ref 8–23)
CO2: 31 mmol/L (ref 22–32)
Calcium: 8.6 mg/dL — ABNORMAL LOW (ref 8.9–10.3)
Chloride: 96 mmol/L — ABNORMAL LOW (ref 98–111)
Creatinine, Ser: 6.83 mg/dL — ABNORMAL HIGH (ref 0.61–1.24)
GFR, Estimated: 8 mL/min — ABNORMAL LOW
Glucose, Bld: 103 mg/dL — ABNORMAL HIGH (ref 70–99)
Potassium: 4.2 mmol/L (ref 3.5–5.1)
Sodium: 138 mmol/L (ref 135–145)
Total Bilirubin: 0.8 mg/dL (ref 0.3–1.2)
Total Protein: 5.3 g/dL — ABNORMAL LOW (ref 6.5–8.1)

## 2022-06-17 LAB — LIPASE, BLOOD: Lipase: 21 U/L (ref 11–51)

## 2022-06-17 LAB — CBC WITH DIFFERENTIAL/PLATELET
Abs Immature Granulocytes: 0.04 10*3/uL (ref 0.00–0.07)
Basophils Absolute: 0.1 10*3/uL (ref 0.0–0.1)
Basophils Relative: 1 %
Eosinophils Absolute: 0.1 10*3/uL (ref 0.0–0.5)
Eosinophils Relative: 1 %
HCT: 30.4 % — ABNORMAL LOW (ref 39.0–52.0)
Hemoglobin: 9.3 g/dL — ABNORMAL LOW (ref 13.0–17.0)
Immature Granulocytes: 1 %
Lymphocytes Relative: 9 %
Lymphs Abs: 0.6 10*3/uL — ABNORMAL LOW (ref 0.7–4.0)
MCH: 29 pg (ref 26.0–34.0)
MCHC: 30.6 g/dL (ref 30.0–36.0)
MCV: 94.7 fL (ref 80.0–100.0)
Monocytes Absolute: 0.6 10*3/uL (ref 0.1–1.0)
Monocytes Relative: 9 %
Neutro Abs: 5.7 10*3/uL (ref 1.7–7.7)
Neutrophils Relative %: 79 %
Platelets: 333 10*3/uL (ref 150–400)
RBC: 3.21 MIL/uL — ABNORMAL LOW (ref 4.22–5.81)
RDW: 15.2 % (ref 11.5–15.5)
WBC: 7.2 10*3/uL (ref 4.0–10.5)
nRBC: 0 % (ref 0.0–0.2)

## 2022-06-17 LAB — TYPE AND SCREEN
ABO/RH(D): A POS
Antibody Screen: NEGATIVE

## 2022-06-17 LAB — CBG MONITORING, ED: Glucose-Capillary: 85 mg/dL (ref 70–99)

## 2022-06-17 NOTE — ED Notes (Signed)
Called lab, they do not have the patients urine sample. Urine sample not in triage. Will recollect when patient can void.

## 2022-06-17 NOTE — ED Provider Notes (Signed)
[facility] EMERGENCY DEPARTMENT Provider Note   CSN: [id number] Arrival date & time: 06/17/22  1927     History  Chief Complaint  Patient presents with   Hematuria    Christian Dalton is a 81 y.o. male.  Pt is a 81 yo male with a pmhx significant for ESRD on HD (Tu, Th, Sat), HTN, DM, COPD, CAD, afib, spinal stenosis, sleep apnea, hypothyroidism, anemia, and hoh.  Pt finished dialysis on 7/7, felt dizzy after dialysis and fell on his left side.  He was found to have a left perinephric/subcapsular hematoma.  He was admitted for blood transfusion (4 units total) and had embolization by IR.  He was d/c on the 13th.  The pt has not noticed any blood in his urine until today.  Pt went to dialysis today, then came her after dialysis for further eval.  Pt denies any current pain.       Home Medications Prior to Admission medications   Medication Sig Start Date End Date Taking? Authorizing Provider  aspirin EC 81 MG tablet Take 81 mg by mouth at bedtime.     [provider]  atorvastatin (LIPITOR) 10 MG tablet Take 10 mg by mouth at bedtime.    [provider]  buPROPion (WELLBUTRIN XL) 150 MG 24 hr tablet Take 150 mg by mouth every evening.    [provider]  docusate sodium (COLACE) 100 MG capsule Take 100 mg by mouth 2 (two) times daily.     [provider]  escitalopram (LEXAPRO) 10 MG tablet Take 10 mg by mouth at bedtime.  08/30/15   [provider]  ferrous sulfate 325 (65 FE) MG tablet Take 1 tablet (325 mg total) by mouth every other day. 06/05/22 07/05/22  Elodia Florence., MD  fluticasone (FLONASE) 50 MCG/ACT nasal spray Place 1 spray into both nostrils daily as needed for allergies or rhinitis.    [provider]  folic acid (FOLVITE) 1 MG tablet Take 1 tablet (1 mg total) by mouth daily. 06/05/22 07/05/22  Elodia Florence., MD  lactulose, encephalopathy, (CHRONULAC) 10 GM/15ML SOLN Take 10 g by mouth  every evening. 08/02/18   [provider]  levothyroxine (SYNTHROID, LEVOTHROID) 125 MCG tablet Take 125 mcg by mouth daily before breakfast.    [provider]  lidocaine-prilocaine (EMLA) cream Apply 1 application topically Every Tuesday,Thursday,and Saturday with dialysis. 08/02/18   [provider]  linaclotide (LINZESS) 290 MCG CAPS capsule Take 290 mcg by mouth every evening.     [provider]  loratadine (CLARITIN) 10 MG tablet Take 10 mg by mouth daily.     [provider]  montelukast (SINGULAIR) 10 MG tablet Take 10 mg every evening by mouth.    [provider]  nitroGLYCERIN (NITROSTAT) 0.4 MG SL tablet Place 0.4 mg under the tongue every 5 (five) minutes as needed for chest pain.    [provider]  oxyCODONE-acetaminophen (PERCOCET) 10-325 MG tablet Take 1 tablet by mouth every 4 (four) hours as needed for pain. 12/09/18   [provider]  PROAIR HFA 108 (90 Base) MCG/ACT inhaler Inhale 2 puffs into the lungs every 6 (six) hours as needed for shortness of breath. 05/27/20   [provider]  Propylene Glycol (SYSTANE BALANCE) 0.6 % SOLN Place 1 drop into both eyes daily as needed (dry eyes).    [provider]  sevelamer carbonate (RENVELA) 800 MG tablet Take 3 tablets (2,400 mg  total) by mouth 3 (three) times daily with meals. Patient taking differently: Take 2,400 mg by mouth 2 (two) times daily with a meal. 08/24/18   Elgergawy, Silver Huguenin, MD  vitamin B-12 1000 MCG tablet Take 1 tablet (1,000 mcg total) by mouth daily. 06/05/22 07/05/22  Elodia Florence., MD      Allergies    Avelox [moxifloxacin hcl], Gabapentin, Metoprolol, Quinolones, Shellfish allergy, Sulfa antibiotics, Tetracyclines & related, and Adhesive [tape]    Review of Systems   Review of Systems  Genitourinary:  Positive for hematuria.  All other systems reviewed and are negative.   Physical Exam Updated Vital Signs BP 110/71    Pulse 81   Temp 98.3 F (36.8 C) (Oral)   Resp 14   Ht '5\' 10"'$  (1.778 m)   Wt 93 kg   SpO2 98%   BMI 29.41 kg/m  Physical Exam Vitals and nursing note reviewed.  Constitutional:      Appearance: Normal appearance. He is obese.  HENT:     Head: Normocephalic and atraumatic.     Right Ear: External ear normal.     Left Ear: External ear normal.     Nose: Nose normal.     Mouth/Throat:     Mouth: Mucous membranes are moist.     Pharynx: Oropharynx is clear.  Eyes:     Extraocular Movements: Extraocular movements intact.     Conjunctiva/sclera: Conjunctivae normal.     Pupils: Pupils are equal, round, and reactive to light.  Cardiovascular:     Rate and Rhythm: Normal rate and regular rhythm.     Pulses: Normal pulses.     Heart sounds: Normal heart sounds.  Pulmonary:     Effort: Pulmonary effort is normal.     Breath sounds: Normal breath sounds.  Abdominal:     General: Abdomen is flat. Bowel sounds are normal.     Palpations: Abdomen is soft.  Musculoskeletal:        General: Normal range of motion.     Cervical back: Normal range of motion and neck supple.     Comments: Left upper arm AVF with good thrill  Skin:    General: Skin is warm.     Capillary Refill: Capillary refill takes less than 2 seconds.  Neurological:     General: No focal deficit present.     Mental Status: He is alert and oriented to person, place, and time.  Psychiatric:        Mood and Affect: Mood normal.        Behavior: Behavior normal.     ED Results / Procedures / Treatments   Labs (all labs ordered are listed, but only abnormal results are displayed) Labs Reviewed  CBC WITH DIFFERENTIAL/PLATELET - Abnormal; Notable for the following components:      Result Value   RBC 3.21 (*)    Hemoglobin 9.3 (*)    HCT 30.4 (*)    Lymphs Abs 0.6 (*)    All other components within normal limits  COMPREHENSIVE METABOLIC PANEL - Abnormal; Notable for the following components:   Chloride 96 (*)     Glucose, Bld 103 (*)    BUN 35 (*)    Creatinine, Ser 6.83 (*)    Calcium 8.6 (*)    Total Protein 5.3 (*)    Albumin 2.6 (*)    AST 6 (*)    Alkaline Phosphatase 198 (*)    GFR, Estimated 8 (*)  All other components within normal limits  LIPASE, BLOOD  URINALYSIS, ROUTINE W REFLEX MICROSCOPIC  CBG MONITORING, ED  TYPE AND SCREEN    EKG None  Radiology No results found.  Procedures Procedures    Medications Ordered in ED Medications - No data to display  ED Course/ Medical Decision Making/ A&P                           Medical Decision Making Amount and/or Complexity of Data Reviewed Labs: ordered. Radiology: ordered.   This patient presents to the ED for concern of hematuria, this involves an extensive number of treatment options, and is a complaint that carries with it a high risk of complications and morbidity.  The differential diagnosis includes kidney injury, uti, kidney stone   Co morbidities that complicate the patient evaluation  ESRD on HD (Tu, Th, Sat), HTN, DM, COPD, CAD, afib, spinal stenosis, sleep apnea, hypothyroidism, anemia, and hoh   Additional history obtained:  Additional history obtained from epic chart review External records from outside source obtained and reviewed including wife   Lab Tests:  I Ordered, and personally interpreted labs.  The pertinent results include:  cbc with hgb 9.3 (last hgb 8.3 on 7/13), cmp with bun 35 and cr 6.83 (chronic); lip 21   Imaging Studies ordered:  I ordered imaging studies including ct abd/pelvis, but that is pending at shift change  Cardiac Monitoring:  The patient was maintained on a cardiac monitor.  I personally viewed and interpreted the cardiac monitored which showed an underlying rhythm of: nsr   Medicines ordered and prescription drug management:  I have reviewed the patients home medicines and have made adjustments as needed   Test Considered:  Ct abd/pelvis   Problem  List / ED Course:  Hematuria:  urine pending; ct pending.  Pt signed out to Dr. Dina Rich.   Social Determinants of Health:  Lives at home   Dispostion:   Pending at shift change        Final Clinical Impression(s) / ED Diagnoses Final diagnoses:  Hematuria, unspecified type  ESRD on hemodialysis [facility])    Rx / DC Orders ED Discharge Orders     None         Isla Pence, MD 06/17/22 2358

## 2022-06-17 NOTE — ED Triage Notes (Signed)
Pt reports recent fall and kidney surgery. Pt now with hematuria for the last day. Pt sent by doctor for further eval.

## 2022-06-18 ENCOUNTER — Emergency Department (HOSPITAL_COMMUNITY): Payer: Medicare Other

## 2022-06-18 LAB — LACTIC ACID, PLASMA: Lactic Acid, Venous: 1.3 mmol/L (ref 0.5–1.9)

## 2022-06-18 LAB — URINALYSIS, ROUTINE W REFLEX MICROSCOPIC
Glucose, UA: NEGATIVE mg/dL
Ketones, ur: NEGATIVE mg/dL
Nitrite: POSITIVE — AB
Protein, ur: >300 mg/dL — AB
Specific Gravity, Urine: 1.01 (ref 1.005–1.030)
pH: 8.5 — ABNORMAL HIGH (ref 5.0–8.0)

## 2022-06-18 LAB — URINALYSIS, MICROSCOPIC (REFLEX)
RBC / HPF: >50 RBC/hpf (ref 0–5)
WBC, UA: >50 WBC/hpf (ref 0–5)

## 2022-06-18 MED ORDER — SODIUM CHLORIDE 0.9 % IV SOLN
1.0000 g | Freq: Once | INTRAVENOUS | Status: AC
  Administered 2022-06-18: 1 g via INTRAVENOUS
  Filled 2022-06-18: qty 10

## 2022-06-18 MED ORDER — SODIUM CHLORIDE 0.9 % IV SOLN: 1.0000 g | Freq: Once | INTRAVENOUS | Status: DC

## 2022-06-18 MED ORDER — IOHEXOL 300 MG/ML  SOLN
80.0000 mL | Freq: Once | INTRAMUSCULAR | Status: AC | PRN
  Administered 2022-06-18: 80 mL via INTRAVENOUS

## 2022-06-18 MED ORDER — CEPHALEXIN 500 MG PO CAPS: 500.0000 mg | ORAL_CAPSULE | Freq: Three times a day (TID) | ORAL | 0 refills | Status: DC

## 2022-06-18 MED ORDER — SODIUM CHLORIDE 0.9 % IV BOLUS
500.0000 mL | Freq: Once | INTRAVENOUS | Status: AC
  Administered 2022-06-18: 500 mL via INTRAVENOUS

## 2022-06-18 NOTE — ED Notes (Addendum)
Patient declined 2nd set of blood cultures and does not want any more needle sticks tonight. Patient also wants the blood pressure cuff off.

## 2022-06-18 NOTE — ED Provider Notes (Signed)
Patient signed out pending CT scan.  Presented with hematuria.  Recent history of perinephric hematoma.  He does have evidence of UTI.  This was cultured and he was given Rocephin.  CT scan shows stable appearing size of hematoma.  Discussed with patient and his wife.  Will discharge with Keflex for UTI.   Merryl Hacker, MD 06/18/22 [phone]

## 2022-06-18 NOTE — ED Notes (Signed)
With CT 

## 2022-06-18 NOTE — ED Notes (Signed)
Back from CT

## 2022-06-18 NOTE — Discharge Instructions (Signed)
You were seen today for bloody urine.  It is of urinary tract infection.  Your perinephric hematoma is unchanged which is reassuring.  Take antibiotics as directed.

## 2022-06-23 LAB — CULTURE, BLOOD (ROUTINE X 2)
Culture: NO GROWTH
Special Requests: ADEQUATE

## 2022-07-07 ENCOUNTER — Encounter (HOSPITAL_COMMUNITY): Payer: Self-pay | Admitting: Vascular Surgery

## 2022-07-07 ENCOUNTER — Other Ambulatory Visit: Payer: Self-pay

## 2022-07-07 ENCOUNTER — Ambulatory Visit (HOSPITAL_COMMUNITY)
Admission: RE | Admit: 2022-07-07 | Discharge: 2022-07-07 | Disposition: A | Discharge status: Home / Self Care | Payer: Medicare Other | Source: Ambulatory Visit | Attending: Vascular Surgery | Admitting: Vascular Surgery

## 2022-07-07 ENCOUNTER — Encounter (HOSPITAL_COMMUNITY)
Admission: RE | Disposition: A | Discharge status: Home / Self Care | Payer: Self-pay | Source: Ambulatory Visit | Attending: Vascular Surgery

## 2022-07-07 DIAGNOSIS — I120 Hypertensive chronic kidney disease with stage 5 chronic kidney disease or end stage renal disease: Secondary | ICD-10-CM | POA: Insufficient documentation

## 2022-07-07 DIAGNOSIS — L97529 Non-pressure chronic ulcer of other part of left foot with unspecified severity: Secondary | ICD-10-CM | POA: Diagnosis not present

## 2022-07-07 DIAGNOSIS — E1151 Type 2 diabetes mellitus with diabetic peripheral angiopathy without gangrene: Secondary | ICD-10-CM | POA: Insufficient documentation

## 2022-07-07 DIAGNOSIS — N186 End stage renal disease: Secondary | ICD-10-CM | POA: Diagnosis not present

## 2022-07-07 DIAGNOSIS — E1122 Type 2 diabetes mellitus with diabetic chronic kidney disease: Secondary | ICD-10-CM | POA: Diagnosis not present

## 2022-07-07 DIAGNOSIS — Z7982 Long term (current) use of aspirin: Secondary | ICD-10-CM | POA: Diagnosis not present

## 2022-07-07 DIAGNOSIS — Z87891 Personal history of nicotine dependence: Secondary | ICD-10-CM | POA: Insufficient documentation

## 2022-07-07 DIAGNOSIS — Z992 Dependence on renal dialysis: Secondary | ICD-10-CM | POA: Diagnosis not present

## 2022-07-07 DIAGNOSIS — E11621 Type 2 diabetes mellitus with foot ulcer: Secondary | ICD-10-CM | POA: Diagnosis not present

## 2022-07-07 DIAGNOSIS — I739 Peripheral vascular disease, unspecified: Secondary | ICD-10-CM

## 2022-07-07 DIAGNOSIS — I998 Other disorder of circulatory system: Secondary | ICD-10-CM

## 2022-07-07 HISTORY — PX: ABDOMINAL AORTOGRAM W/LOWER EXTREMITY: CATH118223

## 2022-07-07 LAB — POCT I-STAT, CHEM 8
BUN: 33 mg/dL — ABNORMAL HIGH (ref 8–23)
Calcium, Ion: 1.18 mmol/L (ref 1.15–1.40)
Chloride: 93 mmol/L — ABNORMAL LOW (ref 98–111)
Creatinine, Ser: 7.5 mg/dL — ABNORMAL HIGH (ref 0.61–1.24)
Glucose, Bld: 105 mg/dL — ABNORMAL HIGH (ref 70–99)
HCT: 37 % — ABNORMAL LOW (ref 39.0–52.0)
Hemoglobin: 12.6 g/dL — ABNORMAL LOW (ref 13.0–17.0)
Potassium: 4.9 mmol/L (ref 3.5–5.1)
Sodium: 137 mmol/L (ref 135–145)
TCO2: 34 mmol/L — ABNORMAL HIGH (ref 22–32)

## 2022-07-07 SURGERY — ABDOMINAL AORTOGRAM W/LOWER EXTREMITY
Anesthesia: LOCAL

## 2022-07-07 MED ORDER — SODIUM CHLORIDE 0.9% FLUSH: 3.0000 mL | Freq: Two times a day (BID) | INTRAVENOUS | Status: DC

## 2022-07-07 MED ORDER — HEPARIN (PORCINE) IN NACL 1000-0.9 UT/500ML-% IV SOLN
INTRAVENOUS | Status: AC
  Filled 2022-07-07: qty 1000

## 2022-07-07 MED ORDER — LIDOCAINE HCL (PF) 1 % IJ SOLN
INTRAMUSCULAR | Status: DC | PRN
  Administered 2022-07-07: 20 mL

## 2022-07-07 MED ORDER — ONDANSETRON HCL 4 MG/2ML IJ SOLN: 4.0000 mg | Freq: Four times a day (QID) | INTRAMUSCULAR | Status: DC | PRN

## 2022-07-07 MED ORDER — LIDOCAINE HCL (PF) 1 % IJ SOLN
INTRAMUSCULAR | Status: AC
  Filled 2022-07-07: qty 30

## 2022-07-07 MED ORDER — MORPHINE SULFATE (PF) 2 MG/ML IV SOLN: 2.0000 mg | INTRAVENOUS | Status: DC | PRN

## 2022-07-07 MED ORDER — IODIXANOL 320 MG/ML IV SOLN
INTRAVENOUS | Status: DC | PRN
  Administered 2022-07-07: 108 mL via INTRA_ARTERIAL

## 2022-07-07 MED ORDER — SODIUM CHLORIDE 0.9 % IV SOLN: 250.0000 mL | INTRAVENOUS | Status: DC | PRN

## 2022-07-07 MED ORDER — SODIUM CHLORIDE 0.9% FLUSH: 3.0000 mL | INTRAVENOUS | Status: DC | PRN

## 2022-07-07 MED ORDER — HEPARIN (PORCINE) IN NACL 1000-0.9 UT/500ML-% IV SOLN
INTRAVENOUS | Status: DC | PRN
  Administered 2022-07-07 (×2): 500 mL

## 2022-07-07 MED ORDER — HYDRALAZINE HCL 20 MG/ML IJ SOLN: 5.0000 mg | INTRAMUSCULAR | Status: DC | PRN

## 2022-07-07 SURGICAL SUPPLY — 10 items
CATH OMNI FLUSH 5F 65CM (CATHETERS) ×1 IMPLANT
CLOSURE MYNX CONTROL 5F (Vascular Products) ×1 IMPLANT
KIT MICROPUNCTURE NIT STIFF (SHEATH) ×1 IMPLANT
KIT PV (KITS) ×2 IMPLANT
SHEATH PINNACLE 5F 10CM (SHEATH) ×1 IMPLANT
SHEATH PROBE COVER 6X72 (BAG) ×1 IMPLANT
SYR MEDRAD MARK V 150ML (SYRINGE) ×1 IMPLANT
TRANSDUCER W/STOPCOCK (MISCELLANEOUS) ×2 IMPLANT
TRAY PV CATH (CUSTOM PROCEDURE TRAY) ×2 IMPLANT
WIRE BENTSON .035X145CM (WIRE) ×1 IMPLANT

## 2022-07-07 NOTE — Op Note (Signed)
    Patient name: Christian Dalton MRN: [id number] DOB: 1941-06-11 Sex: male  07/07/2022 Pre-operative Diagnosis: Bilateral chronic limb threatening ischemia Post-operative diagnosis:  Same Surgeon:  Erlene Quan C. Donzetta Matters, MD Procedure Performed: 1.  Ultrasound-guided cannulation right common femoral artery 2.  Aortogram with bilateral lower extremity runoff 3.  Mynx device closure right common femoral artery  Indications: 81 year old male has a history of end-stage renal disease and bilateral lower extremity wounds.  He has undergone angiography to evaluate the right now presents for evaluation of the left side.  He has bilateral decreased toe pressures in the 20s on the right and 30s on the left.  Findings: Aorta and iliac segments were free of flow-limiting stenosis but calcified.  The EF is very low contrast is quite sluggish and moving.  Bilateral common femoral arteries without disease.  SFA and popliteal segments are calcified with no flow-limiting stenosis.  He appears to have peroneal and posterior tibial runoff all the way to the ankle on the left with dominant runoff via the posterior tibial on the right.  As such no intervention was undertaken.   Procedure:  The patient was identified in the holding area and taken to room 8.  The patient was then placed supine on the table and prepped and draped in the usual sterile fashion.  A time out was called.  Ultrasound was used to evaluate the right common femoral artery which was somewhat calcified although patent.  The area was anesthetized 1% lidocaine cannulated micropuncture needle followed by wire sheath with direct ultrasound visualization and images saved the permanent record.  Concomitantly and his vital signs were monitored throughout the case.  We placed a Bentson wire followed by a 5 French sheath and Omni catheter to the level of L1 performed aortogram coldest out of the bifurcation for bilateral lower extremity runoff.  With the above findings  we elected no intervention the catheter was removed over wire.  A minx device was deployed.  He tolerated procedure without any complication.  Contrast: 108 cc    Toron Bowring C. Donzetta Matters, MD [facility] Office: [phone] Pager: [phone]

## 2022-07-07 NOTE — H&P (Signed)
HPI: Christian Dalton is a 81 y.o. (07-21-1941) male who presents for follow up of PAD. He underwent Aortogram with bilateral lower extremity runoff on 03/17/22. This was performed due to new ulceration of the right lateral foot around the pinky toe. Additionally had a toe pressure less than 60 so was indicated for angiography with possible intervention. The findings are as follows:    Aorta and iliac segments are free of flow-limiting stenosis.  Bilateral renal arteries are patent however do not fill the kidneys consistent with need for dialysis.  Bilateral common and external iliac arteries and common femoral arteries are free of disease.  Bilateral SFAs are calcified but free of disease of the popliteal arteries.  Right-sided site of interest there is initially an anterior tibial takeoff and this occludes after approximately 2 cm.  There is a diminutive peroneal runoff to the ankle which occludes in the dominant runoff is via the posterior tibial artery to the ankle which then gives rise to dorsalis pedis.  After cannulating the dorsalis pedis performed retrograde angiography there is no discernible anterior tibial artery and no intervention was undertaken.  On the left side anterior tibial artery again occludes after a few centimeters. He appears to have dominant runoff via the peroneal artery to the ankle and a posterior tibial artery that goes at least to the ankle without any definitive flow into the foot on the left.    Unfortunately he had no revascularization options on the right. Aggressive local wound care was recommended as he was advised that he was high risk for needing amputation if his wounds were to progress.    He has been seeing podiatry for ulcer wound care every 2 weeks. He reports that he has no pain. He does not feel that wounds are getting any better. He now has new blister on dorsum of right foot and left great toe had a blister and is now ulcerated. He has been keeping his wounds  dressed with band aids. He denies any pain on ambulation. He uses cane to walk due to unsteady gate   He additionally reports that he saw his cardiologist today and is scheduled for TEE and Left and right heart Cath on 05/30/22.   He also has history of left BV AV fistula. This is functioning well. He currently dialyzes on Tues/Thurs/Sat   The pt is on a statin for cholesterol management.  The pt is on a daily aspirin.   Other AC:  None The pt is not on any medication for hypertension.   The pt is diabetic.   Tobacco hx:  Former       Past Medical History:  Diagnosis Date   Acute cholecystitis 08/17/2018   Anemia     Arthritis     Atrial fibrillation (HCC)     CAD (coronary artery disease)     Cholecystitis     COPD (chronic obstructive pulmonary disease) (HCC)     Depression     Diabetes mellitus without complication (HCC)     ESRD (end stage renal disease) on dialysis (HCC)     HOH (hard of hearing)     Hypertension     Hypothyroidism     OSA (obstructive sleep apnea)      does not wear cpap   Renal disorder     Spinal stenosis     Wears dentures      wears top dentures   Wears glasses     Wears hearing aid in  both ears             Past Surgical History:  Procedure Laterality Date   ABDOMINAL AORTOGRAM N/A 01/15/2021    Procedure: ABDOMINAL AORTOGRAM;  Surgeon: Serafina Mitchell, MD;  Location: [facility];  Service: Cardiovascular;  Laterality: N/A;   ABDOMINAL AORTOGRAM W/LOWER EXTREMITY Bilateral 12/11/2020    Procedure: ABDOMINAL AORTOGRAM W/LOWER EXTREMITY;  Surgeon: Serafina Mitchell, MD;  Location: [facility];  Service: Cardiovascular;  Laterality: Bilateral;   ABDOMINAL AORTOGRAM W/LOWER EXTREMITY N/A 03/17/2022    Procedure: ABDOMINAL AORTOGRAM W/LOWER EXTREMITY;  Surgeon: Waynetta Sandy, MD;  Location: [facility];  Service: Cardiovascular;  Laterality: N/A;   BASCILIC VEIN TRANSPOSITION Left 12/02/2019    Procedure: BASCILIC VEIN  TRANSPOSITION LEFT FIRST STAGE;  Surgeon: Serafina Mitchell, MD;  Location: [facility];  Service: Vascular;  Laterality: Left;   [place] Left 02/10/2020    Procedure: BASCILIC VEIN TRANSPOSITION LEFT SECOND STAGE;  Surgeon: Serafina Mitchell, MD;  Location: [place];  Service: Vascular;  Laterality: Left;   BILIARY STENT PLACEMENT   11/25/2018    Procedure: [place];  Surgeon: [name], MD;  Location: [facility];  Service: Gastroenterology;;   BIOPSY   12/20/2018    Procedure: BIOPSY;  Surgeon: [name], MD;  Location: [facility];  Service: Gastroenterology;;   BIOPSY   08/03/2020    Procedure: BIOPSY;  Surgeon: Otis Brace, MD;  Location: [facility];  Service: Gastroenterology;;  EGD and COLON   CARDIAC CATHETERIZATION       CHOLECYSTECTOMY N/A 11/14/2018    Procedure: LAPAROSCOPIC CHOLECYSTECTOMY WITH INTRAOPERATIVE CHOLANGIOGRAM;  Surgeon: Coralie Keens, MD;  Location: [place];  Service: General;  Laterality: N/A;   COLONOSCOPY W/ BIOPSIES AND POLYPECTOMY       COLONOSCOPY WITH PROPOFOL N/A 08/03/2020    Procedure: COLONOSCOPY WITH PROPOFOL;  Surgeon: Otis Brace, MD;  Location: [facility];  Service: Gastroenterology;  Laterality: N/A;   ERCP N/A 11/25/2018    Procedure: ENDOSCOPIC RETROGRADE CHOLANGIOPANCREATOGRAPHY (ERCP);  Surgeon: [name], MD;  Location: [place];  Service: Gastroenterology;  Laterality: N/A;   ESOPHAGOGASTRODUODENOSCOPY N/A 12/20/2018    Procedure: ESOPHAGOGASTRODUODENOSCOPY (EGD);  Surgeon: [name], MD;  Location: [facility];  Service: Gastroenterology;  Laterality: N/A;   ESOPHAGOGASTRODUODENOSCOPY (EGD) WITH PROPOFOL N/A 08/03/2020    Procedure: ESOPHAGOGASTRODUODENOSCOPY (EGD) WITH PROPOFOL;  Surgeon: Otis Brace, MD;  Location: [facility];  Service: Gastroenterology;  Laterality: N/A;   EYE SURGERY        cataract B/L   HAND SURGERY Right     IR DIALY SHUNT INTRO NEEDLE/INTRACATH INITIAL W/IMG LEFT  Left 08/20/2018   IR PERC CHOLECYSTOSTOMY   08/19/2018   IR RADIOLOGIST EVAL & MGMT   09/29/2018   LOWER EXTREMITY ANGIOGRAPHY Left 01/15/2021    Procedure: Lower Extremity Angiography;  Surgeon: Serafina Mitchell, MD;  Location: [facility];  Service: Cardiovascular;  Laterality: Left;   MULTIPLE TOOTH EXTRACTIONS       PERIPHERAL VASCULAR BALLOON ANGIOPLASTY Right 12/11/2020    Procedure: PERIPHERAL VASCULAR BALLOON ANGIOPLASTY;  Surgeon: Serafina Mitchell, MD;  Location: [facility];  Service: Cardiovascular;  Laterality: Right;   PERIPHERAL VASCULAR BALLOON ANGIOPLASTY Left 01/15/2021    Procedure: PERIPHERAL VASCULAR BALLOON ANGIOPLASTY;  Surgeon: Serafina Mitchell, MD;  Location: [facility];  Service: Cardiovascular;  Laterality: Left;  PT   POLYPECTOMY   08/03/2020    Procedure: POLYPECTOMY;  Surgeon:  Otis Brace, MD;  Location: [facility];  Service: Gastroenterology;;   RESECTION OF ARTERIOVENOUS FISTULA ANEURYSM Left 10/27/2019    Procedure: Resection Of Left Upper Arm  Arteriovenous Fistula Aneurysm Times Two;  Surgeon: Serafina Mitchell, MD;  Location: [facility];  Service: Vascular;  Laterality: Left;   SHOULDER SURGERY Right     SPHINCTEROTOMY   11/25/2018    Procedure: SPHINCTEROTOMY;  Surgeon: [name], MD;  Location: [place];  Service: Gastroenterology;;   Lavell Islam REMOVAL   12/20/2018    Procedure: STENT REMOVAL;  Surgeon: [name], MD;  Location: [facility];  Service: Gastroenterology;;   TIBIA FRACTURE SURGERY          Social History         Socioeconomic History   Marital status: Married      Spouse name: Di Kindle   Number of children: Not on file   Years of education: Not on file   Highest education level: Not on file  Occupational History   Not on file  Tobacco Use   Smoking status: Never   Smokeless tobacco: Former      Types: Chew  Vaping Use   Vaping Use: Never used  Substance and Sexual Activity   Alcohol use: Not Currently   Drug  use: No   Sexual activity: Not on file  Other Topics Concern   Not on file  Social History Narrative   Not on file    Social Determinants of Health        Financial Resource Strain: Low Risk  (08/17/2018)    Overall Financial Resource Strain (CARDIA)     Difficulty of Paying Living Expenses: Not hard at all  Food Insecurity: Unknown (08/17/2018)    Hunger Vital Sign     Worried About [place] in the Last Year: Patient refused     [place] in the Last Year: Patient refused  Transportation Needs: Unknown (08/17/2018)    [place] - Transportation     Lack of Transportation (Medical): Patient refused     Lack of Transportation (Non-Medical): Patient refused  Physical Activity: Unknown (08/17/2018)    Exercise Vital Sign     Days of Exercise per Week: Patient refused     Minutes of Exercise per Session: Patient refused  Stress: No Stress Concern Present (08/17/2018)    [employer] [place]     Feeling of Stress : Only a little  Social Connections: Not on file  Intimate Partner Violence: Not on file           Family History  Problem Relation Age of Onset   Heart disease Mother     Heart disease Father     Heart disease Sister              Current Outpatient Medications  Medication Sig Dispense Refill   aspirin EC 81 MG tablet Take 81 mg by mouth at bedtime.        atorvastatin (LIPITOR) 10 MG tablet Take 10 mg by mouth at bedtime.       buPROPion (WELLBUTRIN XL) 150 MG 24 hr tablet Take 150 mg by mouth every evening.       docusate sodium (COLACE) 100 MG capsule Take 100 mg by mouth 2 (two) times daily.        escitalopram (LEXAPRO) 10 MG tablet Take 10 mg by mouth at bedtime.        fluticasone (FLONASE) 50 MCG/ACT  nasal spray Place 1 spray into both nostrils daily as needed for allergies or rhinitis.       lactulose, encephalopathy, (CHRONULAC) 10 GM/15ML SOLN Take 10 g by mouth every evening.   4    levothyroxine (SYNTHROID, LEVOTHROID) 125 MCG tablet Take 125 mcg by mouth daily before breakfast.       lidocaine-prilocaine (EMLA) cream Apply 1 application topically Every Tuesday,Thursday,and Saturday with dialysis.   4   linaclotide (LINZESS) 290 MCG CAPS capsule Take 290 mcg by mouth every evening.        loratadine (CLARITIN) 10 MG tablet Take 10 mg by mouth daily.        montelukast (SINGULAIR) 10 MG tablet Take 10 mg every evening by mouth.       nitroGLYCERIN (NITROSTAT) 0.4 MG SL tablet Place 0.4 mg under the tongue every 5 (five) minutes as needed for chest pain.       oxyCODONE-acetaminophen (PERCOCET) 10-325 MG tablet Take 1 tablet by mouth every 4 (four) hours as needed for pain.       PROAIR HFA 108 (90 Base) MCG/ACT inhaler Inhale 2 puffs into the lungs every 6 (six) hours as needed for shortness of breath.       Propylene Glycol (SYSTANE BALANCE) 0.6 % SOLN Place 1 drop into both eyes as needed (dry eyes).       sevelamer carbonate (RENVELA) 800 MG tablet Take 3 tablets (2,400 mg total) by mouth 3 (three) times daily with meals. (Patient taking differently: Take 2,400 mg by mouth 2 (two) times daily with a meal.) 45 tablet 0    No current facility-administered medications for this visit.           Allergies  Allergen Reactions   Avelox [Moxifloxacin Hcl] Other (See Comments)      Hallucinations     Gabapentin Other (See Comments)      Caused staggering and red feet   Metoprolol Other (See Comments)      Dropped pulse too low; was taken off of this by MD   Quinolones Other (See Comments)      unknown   Shellfish Allergy Nausea And Vomiting and Other (See Comments)      Made patient VERY nauseous and he developed stomach cramps   Sulfa Antibiotics Other (See Comments)      Allergy is from childhood (reaction not recalled)   Tetracyclines & Related Other (See Comments)      Blisters on hands and arms   Adhesive [Tape] Itching and Rash      Paper tape only- skin cannot  tolerate "heavy" tapes        REVIEW OF SYSTEMS:    '[X]'$  denotes positive finding, '[ ]'$  denotes negative finding Cardiac   Comments:  Chest pain or chest pressure:      Shortness of breath upon exertion:      Short of breath when lying flat:      Irregular heart rhythm:             Vascular      Pain in calf, thigh, or hip brought on by ambulation:      Pain in feet at night that wakes you up from your sleep:       Blood clot in your veins:      Leg swelling:              Pulmonary      Oxygen at home:      Productive cough:  Wheezing:              Neurologic      Sudden weakness in arms or legs:       Sudden numbness in arms or legs:       Sudden onset of difficulty speaking or slurred speech:      Temporary loss of vision in one eye:       Problems with dizziness:              Gastrointestinal      Blood in stool:       Vomited blood:              Genitourinary      Burning when urinating:       Blood in urine:             Psychiatric      Major depression:              Hematologic      Bleeding problems:      Problems with blood clotting too easily:             Skin      Rashes or ulcers:             Constitutional      Fever or chills:          PHYSICAL EXAMINATION:      General:  WDWN in NAD; vital signs documented above Gait: ambulates with cane HENT: WNL, normocephalic Pulmonary: normal non-labored breathing , without wheezing Cardiac: regular HR, without  Murmurs without carotid bruit Abdomen: distended, soft Vascular Exam/Pulses:   Right Left  Radial 2+ (normal) 2+ (normal)  Femoral 2+ (normal) 2+ (normal)  Popliteal absent absent  DP absent absent  PT absent absent    Extremities: with ischemic changes, without Gangrene , without cellulitis; with open wounds;   Right lateral foot wounds and bullous lesion on dorsum of foot  Right medial foot superficial skin wound   Left great toe ulceration  Small ulceration between left 4th  and 5th toes, on dorsum of 3rd toe( dry eschar) Musculoskeletal: no muscle wasting or atrophy       Neurologic: A&O X 3;  No focal weakness or paresthesias are detected Psychiatric:  The pt has Normal affect.     Non-Invasive Vascular Imaging:   +-------+-----------+-----------+------------+------------+  ABI/TBIToday's ABIToday's TBIPrevious ABIPrevious TBI  +-------+-----------+-----------+------------+------------+  Right  [place]         0.23                                 +-------+-----------+-----------+------------+------------+  Left   [place]         0.16                                 +-------+-----------+-----------+------------+------------+  Right toe pressure of 31 mmHg Left toe pressure of 21 mmHg   ASSESSMENT/PLAN:: 81 y.o. male here for follow up for PAD. He recently underwent angiogram BLE. No intervention was taken as he had no revascularization options on RLE. His wound remains unhealed and he has new ulceration on left great toe. He will continue to follow up with podiatry for wound care. His ABI today shows non compressible vessels with severe tibial disease. His toe pressures put him at very high risk for wound healing - Discussed  case with Dr. Donzetta Matters who reviewed Angiogram images and also saw patient and recommends attempt at intervention on LLE - Should his wounds progress he will likely need more proximal amputation as he has severe tibial disease bilaterally - Continue Aspirin and statin - discussed importance of keeping his feet protected. He can paint wounds with Betadine  Jesica Goheen C. Donzetta Matters, MD [facility] Office: [phone] Pager: [phone]

## 2022-07-07 NOTE — Progress Notes (Signed)
Patient marched in place. No bleeding noted. Dressing remained CDI. Patient allowed to get dressed

## 2022-07-10 ENCOUNTER — Emergency Department (HOSPITAL_COMMUNITY): Payer: Medicare Other

## 2022-07-10 ENCOUNTER — Encounter (HOSPITAL_COMMUNITY): Payer: Self-pay | Admitting: Emergency Medicine

## 2022-07-10 ENCOUNTER — Other Ambulatory Visit: Payer: Self-pay

## 2022-07-10 ENCOUNTER — Emergency Department (HOSPITAL_COMMUNITY)
Admission: EM | Admit: 2022-07-10 | Discharge: 2022-07-10 | Disposition: A | Discharge status: Home / Self Care | Payer: Medicare Other | Attending: Emergency Medicine | Admitting: Emergency Medicine

## 2022-07-10 ENCOUNTER — Telehealth: Payer: Self-pay

## 2022-07-10 DIAGNOSIS — I96 Gangrene, not elsewhere classified: Secondary | ICD-10-CM | POA: Diagnosis not present

## 2022-07-10 DIAGNOSIS — I480 Paroxysmal atrial fibrillation: Secondary | ICD-10-CM | POA: Diagnosis not present

## 2022-07-10 DIAGNOSIS — I120 Hypertensive chronic kidney disease with stage 5 chronic kidney disease or end stage renal disease: Secondary | ICD-10-CM | POA: Diagnosis not present

## 2022-07-10 DIAGNOSIS — L089 Local infection of the skin and subcutaneous tissue, unspecified: Secondary | ICD-10-CM | POA: Insufficient documentation

## 2022-07-10 DIAGNOSIS — Z7982 Long term (current) use of aspirin: Secondary | ICD-10-CM | POA: Diagnosis not present

## 2022-07-10 DIAGNOSIS — E1122 Type 2 diabetes mellitus with diabetic chronic kidney disease: Secondary | ICD-10-CM | POA: Diagnosis not present

## 2022-07-10 DIAGNOSIS — Z79899 Other long term (current) drug therapy: Secondary | ICD-10-CM | POA: Diagnosis not present

## 2022-07-10 DIAGNOSIS — I739 Peripheral vascular disease, unspecified: Secondary | ICD-10-CM | POA: Diagnosis present

## 2022-07-10 DIAGNOSIS — Z992 Dependence on renal dialysis: Secondary | ICD-10-CM | POA: Insufficient documentation

## 2022-07-10 DIAGNOSIS — N186 End stage renal disease: Secondary | ICD-10-CM | POA: Diagnosis not present

## 2022-07-10 LAB — COMPREHENSIVE METABOLIC PANEL
ALT: 7 U/L (ref 0–44)
AST: 9 U/L — ABNORMAL LOW (ref 15–41)
Albumin: 2.7 g/dL — ABNORMAL LOW (ref 3.5–5.0)
Alkaline Phosphatase: 113 U/L (ref 38–126)
Anion gap: 14 (ref 5–15)
BUN: 33 mg/dL — ABNORMAL HIGH (ref 8–23)
CO2: 25 mmol/L (ref 22–32)
Calcium: 9.3 mg/dL (ref 8.9–10.3)
Chloride: 100 mmol/L (ref 98–111)
Creatinine, Ser: 8.28 mg/dL — ABNORMAL HIGH (ref 0.61–1.24)
GFR, Estimated: 6 mL/min — ABNORMAL LOW (ref >60)
Glucose, Bld: 115 mg/dL — ABNORMAL HIGH (ref 70–99)
Potassium: 4.4 mmol/L (ref 3.5–5.1)
Sodium: 139 mmol/L (ref 135–145)
Total Bilirubin: 0.5 mg/dL (ref 0.3–1.2)
Total Protein: 5.5 g/dL — ABNORMAL LOW (ref 6.5–8.1)

## 2022-07-10 LAB — CBC WITH DIFFERENTIAL/PLATELET
Abs Immature Granulocytes: 0.01 10*3/uL (ref 0.00–0.07)
Basophils Absolute: 0 10*3/uL (ref 0.0–0.1)
Basophils Relative: 1 %
Eosinophils Absolute: 0.1 10*3/uL (ref 0.0–0.5)
Eosinophils Relative: 2 %
HCT: 38.4 % — ABNORMAL LOW (ref 39.0–52.0)
Hemoglobin: 11.4 g/dL — ABNORMAL LOW (ref 13.0–17.0)
Immature Granulocytes: 0 %
Lymphocytes Relative: 12 %
Lymphs Abs: 0.7 10*3/uL (ref 0.7–4.0)
MCH: 28.3 pg (ref 26.0–34.0)
MCHC: 29.7 g/dL — ABNORMAL LOW (ref 30.0–36.0)
MCV: 95.3 fL (ref 80.0–100.0)
Monocytes Absolute: 0.6 10*3/uL (ref 0.1–1.0)
Monocytes Relative: 10 %
Neutro Abs: 4.4 10*3/uL (ref 1.7–7.7)
Neutrophils Relative %: 75 %
Platelets: 249 10*3/uL (ref 150–400)
RBC: 4.03 MIL/uL — ABNORMAL LOW (ref 4.22–5.81)
RDW: 15.6 % — ABNORMAL HIGH (ref 11.5–15.5)
WBC: 5.9 10*3/uL (ref 4.0–10.5)
nRBC: 0 % (ref 0.0–0.2)

## 2022-07-10 LAB — I-STAT CHEM 8, ED
BUN: 32 mg/dL — ABNORMAL HIGH (ref 8–23)
Calcium, Ion: 0.94 mmol/L — ABNORMAL LOW (ref 1.15–1.40)
Chloride: 100 mmol/L (ref 98–111)
Creatinine, Ser: 9.2 mg/dL — ABNORMAL HIGH (ref 0.61–1.24)
Glucose, Bld: 114 mg/dL — ABNORMAL HIGH (ref 70–99)
HCT: 38 % — ABNORMAL LOW (ref 39.0–52.0)
Hemoglobin: 12.9 g/dL — ABNORMAL LOW (ref 13.0–17.0)
Potassium: 4.2 mmol/L (ref 3.5–5.1)
Sodium: 134 mmol/L — ABNORMAL LOW (ref 135–145)
TCO2: 28 mmol/L (ref 22–32)

## 2022-07-10 LAB — C-REACTIVE PROTEIN: CRP: 11.7 mg/dL — ABNORMAL HIGH (ref <1.0)

## 2022-07-10 LAB — SEDIMENTATION RATE: Sed Rate: 26 mm/hr — ABNORMAL HIGH (ref 0–16)

## 2022-07-10 LAB — LACTIC ACID, PLASMA: Lactic Acid, Venous: 1.6 mmol/L (ref 0.5–1.9)

## 2022-07-10 MED ORDER — VANCOMYCIN HCL 2000 MG/400ML IV SOLN
2000.0000 mg | Freq: Once | INTRAVENOUS | Status: DC
  Filled 2022-07-10: qty 400

## 2022-07-10 MED ORDER — CLINDAMYCIN HCL 150 MG PO CAPS: 300.0000 mg | ORAL_CAPSULE | Freq: Three times a day (TID) | ORAL | 0 refills | Status: DC

## 2022-07-10 MED ORDER — CLINDAMYCIN HCL 150 MG PO CAPS
300.0000 mg | ORAL_CAPSULE | Freq: Once | ORAL | Status: AC
  Administered 2022-07-10: 300 mg via ORAL
  Filled 2022-07-10: qty 2

## 2022-07-10 MED ORDER — CLINDAMYCIN HCL 150 MG PO CAPS: 300.0000 mg | ORAL_CAPSULE | Freq: Three times a day (TID) | ORAL | 0 refills | Status: AC

## 2022-07-10 NOTE — Telephone Encounter (Signed)
Pt's wife, [name], called stating that pt was back in hospital. Pt's podiatrist sent him back d/t possible foot infection. She requested that Dr Donzetta Matters be notified.  Page sent to Dr Donzetta Matters to inform him of call and pt's status.

## 2022-07-10 NOTE — ED Provider Triage Note (Signed)
Emergency Medicine Provider Triage Evaluation Note  Christian Dalton , a 81 y.o. male  was evaluated in triage.  Pt complains of bilateral foot gangrene, worsening, sent by Dr. Gretta Arab (podiatry) to the hospital for admission. Also managed by Dr. Donzetta Matters (vascular) who he saw last week and was told he was not a candidate for vascular intervention. Dialysis pt Tia Alert), missed dialysis today, last dialysis Tuesday- 2 hours (Tues/ Thurs/ Sat), needs O2 for dialysis and was to have home O2 delivered today.  Review of Systems  Positive: Foot infection Negative: Fever, SHOB, CP  Physical Exam  BP 117/83   Pulse 89   Temp 98.6 F (37 C) (Oral)   Resp 18   SpO2 100%  Gen:   Awake, no distress   Resp:  Normal effort  MSK:     Other:    Medical Decision Making  Medically screening exam initiated at 10:33 AM.  Appropriate orders placed.  Arnett Guggenheim was informed that the remainder of the evaluation will be completed by another provider, this initial triage assessment does not replace that evaluation, and the importance of remaining in the ED until their evaluation is complete.     Tacy Learn, PA-C 07/10/22 1036

## 2022-07-10 NOTE — Consult Note (Signed)
Reason for Consult:Gangrene  Referring Physician: Dr. Faylene Million, MD  Christian Dalton is an 81 y.o. male.  HPI: 81 year old male with past medical history significant for end-stage renal disease, COPD, CHF with ejection fraction of 20 to 25%, diabetes, PAD.  He was seen by his podiatrist, Dr. Gretta Arab earlier today.  To the emergency room for concerns of infection.  He recently underwent angio with vascular surgery earlier this week.  No invention was performed as it and running flow down both extremities with peroneal posterior tibial runoff on the left and posterior tibial runoff on the right.  Apparently the wounds have been stable over the last several days.  Past Medical History:  Diagnosis Date   Acute cholecystitis 08/17/2018   Anemia    Arthritis    Atrial fibrillation (HCC)    CAD (coronary artery disease)    Cholecystitis    COPD (chronic obstructive pulmonary disease) (HCC)    Depression    Diabetes mellitus without complication (HCC)    ESRD (end stage renal disease) on dialysis (HCC)    HOH (hard of hearing)    Hypertension    Hypothyroidism    OSA (obstructive sleep apnea)    does not wear cpap   Spinal stenosis    Wears dentures    wears top dentures   Wears glasses    Wears hearing aid in both ears     Past Surgical History:  Procedure Laterality Date   ABDOMINAL AORTOGRAM N/A 01/15/2021   Procedure: ABDOMINAL AORTOGRAM;  Surgeon: Serafina Mitchell, MD;  Location: [facility];  Service: Cardiovascular;  Laterality: N/A;   ABDOMINAL AORTOGRAM W/LOWER EXTREMITY Bilateral 12/11/2020   Procedure: ABDOMINAL AORTOGRAM W/LOWER EXTREMITY;  Surgeon: Serafina Mitchell, MD;  Location: [facility];  Service: Cardiovascular;  Laterality: Bilateral;   ABDOMINAL AORTOGRAM W/LOWER EXTREMITY N/A 03/17/2022   Procedure: ABDOMINAL AORTOGRAM W/LOWER EXTREMITY;  Surgeon: Waynetta Sandy, MD;  Location: [facility];  Service: Cardiovascular;  Laterality: N/A;    ABDOMINAL AORTOGRAM W/LOWER EXTREMITY N/A 07/07/2022   Procedure: ABDOMINAL AORTOGRAM W/LOWER EXTREMITY;  Surgeon: Waynetta Sandy, MD;  Location: [facility];  Service: Cardiovascular;  Laterality: N/A;   BASCILIC VEIN TRANSPOSITION Left 12/02/2019   Procedure: BASCILIC VEIN TRANSPOSITION LEFT FIRST STAGE;  Surgeon: Serafina Mitchell, MD;  Location: [facility];  Service: Vascular;  Laterality: Left;   [place] Left 02/10/2020   Procedure: BASCILIC VEIN TRANSPOSITION LEFT SECOND STAGE;  Surgeon: Serafina Mitchell, MD;  Location: [facility];  Service: Vascular;  Laterality: Left;   BILIARY STENT PLACEMENT  11/25/2018   Procedure: [place];  Surgeon: [name], MD;  Location: [facility];  Service: Gastroenterology;;   BIOPSY  12/20/2018   Procedure: BIOPSY;  Surgeon: [name], MD;  Location: [facility];  Service: Gastroenterology;;   BIOPSY  08/03/2020   Procedure: BIOPSY;  Surgeon: Otis Brace, MD;  Location: [facility];  Service: Gastroenterology;;  EGD and COLON   CARDIAC CATHETERIZATION     CHOLECYSTECTOMY N/A 11/14/2018   Procedure: LAPAROSCOPIC CHOLECYSTECTOMY WITH INTRAOPERATIVE CHOLANGIOGRAM;  Surgeon: Coralie Keens, MD;  Location: [place];  Service: General;  Laterality: N/A;   COLONOSCOPY W/ BIOPSIES AND POLYPECTOMY     COLONOSCOPY WITH PROPOFOL N/A 08/03/2020   Procedure: COLONOSCOPY WITH PROPOFOL;  Surgeon: Otis Brace, MD;  Location: [facility];  Service: Gastroenterology;  Laterality: N/A;   ERCP N/A 11/25/2018   Procedure: ENDOSCOPIC RETROGRADE CHOLANGIOPANCREATOGRAPHY (ERCP);  Surgeon: [name],  MD;  Location: [place];  Service: Gastroenterology;  Laterality: N/A;   ESOPHAGOGASTRODUODENOSCOPY N/A 12/20/2018   Procedure: ESOPHAGOGASTRODUODENOSCOPY (EGD);  Surgeon: [name], MD;  Location: [facility];  Service: Gastroenterology;  Laterality: N/A;   ESOPHAGOGASTRODUODENOSCOPY (EGD) WITH PROPOFOL N/A 08/03/2020    Procedure: ESOPHAGOGASTRODUODENOSCOPY (EGD) WITH PROPOFOL;  Surgeon: Otis Brace, MD;  Location: [facility];  Service: Gastroenterology;  Laterality: N/A;   EYE SURGERY     cataract B/L   HAND SURGERY Right    IR ANGIOGRAM SELECTIVE EACH ADDITIONAL VESSEL  05/30/2022   IR ANGIOGRAM SELECTIVE EACH ADDITIONAL VESSEL  05/30/2022   IR ANGIOGRAM SELECTIVE EACH ADDITIONAL VESSEL  05/30/2022   IR ANGIOGRAM SELECTIVE EACH ADDITIONAL VESSEL  05/30/2022   IR ANGIOGRAM SELECTIVE EACH ADDITIONAL VESSEL  05/30/2022   IR ANGIOGRAM SELECTIVE EACH ADDITIONAL VESSEL  05/30/2022   IR ANGIOGRAM VISCERAL SELECTIVE  05/30/2022   IR DIALY SHUNT INTRO NEEDLE/INTRACATH INITIAL W/IMG LEFT Left 08/20/2018   IR EMBO ART  VEN HEMORR LYMPH EXTRAV  INC GUIDE ROADMAPPING  05/30/2022   IR PERC CHOLECYSTOSTOMY  08/19/2018   IR RADIOLOGIST EVAL & MGMT  09/29/2018   IR US GUIDE VASC ACCESS RIGHT  05/30/2022   LOWER EXTREMITY ANGIOGRAPHY Left 01/15/2021   Procedure: Lower Extremity Angiography;  Surgeon: Serafina Mitchell, MD;  Location: [facility];  Service: Cardiovascular;  Laterality: Left;   MULTIPLE TOOTH EXTRACTIONS     PERIPHERAL VASCULAR BALLOON ANGIOPLASTY Right 12/11/2020   Procedure: PERIPHERAL VASCULAR BALLOON ANGIOPLASTY;  Surgeon: Serafina Mitchell, MD;  Location: [place];  Service: Cardiovascular;  Laterality: Right;   PERIPHERAL VASCULAR BALLOON ANGIOPLASTY Left 01/15/2021   Procedure: PERIPHERAL VASCULAR BALLOON ANGIOPLASTY;  Surgeon: Serafina Mitchell, MD;  Location: [facility];  Service: Cardiovascular;  Laterality: Left;  PT   POLYPECTOMY  08/03/2020   Procedure: POLYPECTOMY;  Surgeon: Otis Brace, MD;  Location: [facility];  Service: Gastroenterology;;   RESECTION OF ARTERIOVENOUS FISTULA ANEURYSM Left 10/27/2019   Procedure: Resection Of Left Upper Arm  Arteriovenous Fistula Aneurysm Times Two;  Surgeon: Serafina Mitchell, MD;  Location: [place];  Service: Vascular;  Laterality: Left;    SHOULDER SURGERY Right    SPHINCTEROTOMY  11/25/2018   Procedure: SPHINCTEROTOMY;  Surgeon: [name], MD;  Location: [place];  Service: Gastroenterology;;   Lavell Islam REMOVAL  12/20/2018   Procedure: STENT REMOVAL;  Surgeon: [name], MD;  Location: [facility];  Service: Gastroenterology;;   TIBIA FRACTURE SURGERY      Family History  Problem Relation Age of Onset   Heart disease Mother    Heart disease Father    Heart disease Sister     Social History:  reports that he has never smoked. He has quit using smokeless tobacco.  His smokeless tobacco use included chew. He reports that he does not currently use alcohol. He reports that he does not use drugs.  Allergies:  Allergies  Allergen Reactions   Avelox [Moxifloxacin Hcl] Other (See Comments)    Hallucinations    Gabapentin Other (See Comments)    Caused staggering and red feet   Metoprolol Other (See Comments)    Dropped pulse too low; was taken off of this by MD   Quinolones Other (See Comments)    unknown   Shellfish Allergy Nausea And Vomiting and Other (See Comments)    Made patient VERY nauseous and he developed stomach cramps   Sulfa Antibiotics Other (See Comments)    Allergy is from  childhood (reaction not recalled)   Tetracyclines & Related Other (See Comments)    Blisters on hands and arms   Adhesive [Tape] Itching and Rash    Paper tape only- skin cannot tolerate "heavy" tapes    Medications: I have reviewed the patient's current medications.  Results for orders placed or performed during the hospital encounter of 07/10/22 (from the past 48 hour(s))  Lactic acid, plasma     Status: None   Collection Time: 07/10/22 10:52 AM  Result Value Ref Range   Lactic Acid, Venous 1.6 0.5 - 1.9 mmol/L    Comment: Performed at [facility], [address] [place], [place] [address]  Comprehensive metabolic panel     Status: Abnormal   Collection Time: 07/10/22 10:52 AM  Result Value Ref Range   Sodium 139  135 - 145 mmol/L   Potassium 4.4 3.5 - 5.1 mmol/L   Chloride 100 98 - 111 mmol/L   CO2 25 22 - 32 mmol/L   Glucose, Bld 115 (H) 70 - 99 mg/dL    Comment: Glucose reference range applies only to samples taken after fasting for at least 8 hours.   BUN 33 (H) 8 - 23 mg/dL   Creatinine, Ser 8.28 (H) 0.61 - 1.24 mg/dL   Calcium 9.3 8.9 - 10.3 mg/dL   Total Protein 5.5 (L) 6.5 - 8.1 g/dL   Albumin 2.7 (L) 3.5 - 5.0 g/dL   AST 9 (L) 15 - 41 U/L   ALT 7 0 - 44 U/L   Alkaline Phosphatase 113 38 - 126 U/L   Total Bilirubin 0.5 0.3 - 1.2 mg/dL   GFR, Estimated 6 (L) >60 mL/min    Comment: (NOTE) Calculated using the CKD-EPI Creatinine Equation (2021)    Anion gap 14 5 - 15    Comment: Performed at [facility], [place] [address] [place], [place] [address]  CBC with Differential     Status: Abnormal   Collection Time: 07/10/22 10:52 AM  Result Value Ref Range   WBC 5.9 4.0 - 10.5 K/uL   RBC 4.03 (L) 4.22 - 5.81 MIL/uL   Hemoglobin 11.4 (L) 13.0 - 17.0 g/dL   HCT 38.4 (L) 39.0 - 52.0 %   MCV 95.3 80.0 - 100.0 fL   MCH 28.3 26.0 - 34.0 pg   MCHC 29.7 (L) 30.0 - 36.0 g/dL   RDW 15.6 (H) 11.5 - 15.5 %   Platelets 249 150 - 400 K/uL   nRBC 0.0 0.0 - 0.2 %   Neutrophils Relative % 75 %   Neutro Abs 4.4 1.7 - 7.7 K/uL   Lymphocytes Relative 12 %   Lymphs Abs 0.7 0.7 - 4.0 K/uL   Monocytes Relative 10 %   Monocytes Absolute 0.6 0.1 - 1.0 K/uL   Eosinophils Relative 2 %   Eosinophils Absolute 0.1 0.0 - 0.5 K/uL   Basophils Relative 1 %   Basophils Absolute 0.0 0.0 - 0.1 K/uL   Immature Granulocytes 0 %   Abs Immature Granulocytes 0.01 0.00 - 0.07 K/uL    Comment: Performed at [facility], [address], [place], [place] [address]  I-stat chem 8, ED     Status: Abnormal   Collection Time: 07/10/22 11:12 AM  Result Value Ref Range   Sodium 134 (L) 135 - 145 mmol/L   Potassium 4.2 3.5 - 5.1 mmol/L   Chloride 100 98 - 111 mmol/L   BUN 32 (H) 8 - 23 mg/dL  Creatinine, Ser  9.20 (H) 0.61 - 1.24 mg/dL   Glucose, Bld 114 (H) 70 - 99 mg/dL    Comment: Glucose reference range applies only to samples taken after fasting for at least 8 hours.   Calcium, Ion 0.94 (L) 1.15 - 1.40 mmol/L   TCO2 28 22 - 32 mmol/L   Hemoglobin 12.9 (L) 13.0 - 17.0 g/dL   HCT 38.0 (L) 39.0 - 52.0 %  Sedimentation rate     Status: Abnormal   Collection Time: 07/10/22  2:47 PM  Result Value Ref Range   Sed Rate 26 (H) 0 - 16 mm/hr    Comment: Performed at [facility] [address], [place], [place] [address]  C-reactive protein     Status: Abnormal   Collection Time: 07/10/22  2:47 PM  Result Value Ref Range   CRP 11.7 (H) <1.0 mg/dL    Comment: Performed at [facility], [place] [address] [place], [place] [address]    DG Foot Complete Right  Result Date: 07/10/2022 CLINICAL DATA:  Foot infection. EXAM: RIGHT FOOT COMPLETE - 3+ VIEW COMPARISON:  None Available. FINDINGS: There is no evidence of fracture or dislocation. There is no evidence of arthropathy or other focal bone abnormality. Vascular calcifications are noted. No lytic abnormality is noted. IMPRESSION: No acute abnormality seen. Electronically Signed   By: Marijo Conception M.D.   On: 07/10/2022 11:30   DG Foot Complete Left  Result Date: 07/10/2022 CLINICAL DATA:  Foot infection. EXAM: LEFT FOOT - COMPLETE 3+ VIEW COMPARISON:  None Available. FINDINGS: There is no evidence of fracture or dislocation. There is no evidence of arthropathy or other focal bone abnormality. Vascular calcifications are noted. No lytic destruction is noted. IMPRESSION: No acute abnormality seen. Electronically Signed   By: Marijo Conception M.D.   On: 07/10/2022 11:28   DG Chest 2 View  Result Date: 07/10/2022 CLINICAL DATA:  Foot infection. EXAM: CHEST - 2 VIEW COMPARISON:  June 02, 2022. FINDINGS: Stable cardiomediastinal silhouette. No acute pulmonary disease is noted. Bony thorax is unremarkable. IMPRESSION: No active cardiopulmonary  disease. Electronically Signed   By: Marijo Conception M.D.   On: 07/10/2022 11:27    Review of Systems Blood pressure 109/66, pulse 81, temperature 98.6 F (37 C), temperature source Oral, resp. rate 17, SpO2 97 %. Physical Exam General: AAO x3, NAD  Dermatological: Gangrenous changes present bilaterally.  On the left side mostly to the hallux as well as second toe.  On the right foot to the plantar hallux as well as the dorsal second, fourth toe as well to the fifth toe extending to the fifth metatarsal head, fifth metatarsal base as well as the midfoot.  Erythema present on the right foot without significant warmth.  No drainage or pus and there is no fluctuation recommendation.  Gangrene appears to be dry.       Vascular: Nonpalpable pulses.  Neruologic: Sensation decreased  Musculoskeletal: No pain on exam  Assessment/Plan: Gangrenous changes, chronic bilaterally  -X-rays reviewed, no acute abnormality seen. -White blood cell count normal as well as lactic acid.  Sed rate, CRP is elevated -Discussed the case with Dr. Carlis Abbott from vascular surgery as well as reviewed the notes.  It can be difficult to debride and may need to require higher amputation than what I can provide.  This is a difficult situation to be in.  After discussion with vascular as well as with the patient as the symptoms  have not worsened we will discharge home with oral antibiotics with close follow-up with vascular surgery as well as podiatry.  If he needs midfoot amputations we can be of assistance.  Trula Slade 07/10/2022, 5:13 PM

## 2022-07-10 NOTE — Discharge Instructions (Addendum)
You were seen in the emergency room today for worsening wounds to your feet.  We had our vascular surgeons and our foot doctors come to take a look at you.  They think that you may need surgery in the near future although not today.  We are going to give you a prescription for antibiotics which you must fill and complete.  Your vascular doctors and foot doctors will be in touch with you in the near future to schedule an appointment.  If you develop fevers or chills please come back to the emergency department.  Please also come back if your foot starts to appear worse.

## 2022-07-10 NOTE — Consult Note (Signed)
Hospital Consult    Reason for Consult:  BLE tissue loss Referring Physician:  ED MRN #:  [id number]  History of Present Illness: This is a 81 y.o. male with history of end-stage renal disease, COPD, perinephric hematoma, congestive heart failure with an EF of 20 to 25%, diabetes, peripheral arterial disease that vascular surgery has been consulted for bilateral lower extremity tissue loss.  Reportedly sent to the ED today by his podiatrist given concern for infection.  Patient is well-known to vascular surgery.  He had a right peroneal angioplasty on 12/11/2020.  He had a left posterior tibial angioplasty on 01/15/2021.  Patient most recently underwent bilateral lower extremity angiogram on 07/07/2022 by Dr. Donzetta Matters.  No intervention was performed as he had inline flow down both lower extremities with peroneal and posterior tibial runoff on the left and posterior tibial runoff on the right.  No pain in his feet.  States no fevers.  Overall feels well.  Only here because his podiatrist told him to come here.  Past Medical History:  Diagnosis Date   Acute cholecystitis 08/17/2018   Anemia    Arthritis    Atrial fibrillation (HCC)    CAD (coronary artery disease)    Cholecystitis    COPD (chronic obstructive pulmonary disease) (HCC)    Depression    Diabetes mellitus without complication (HCC)    ESRD (end stage renal disease) on dialysis (HCC)    HOH (hard of hearing)    Hypertension    Hypothyroidism    OSA (obstructive sleep apnea)    does not wear cpap   Spinal stenosis    Wears dentures    wears top dentures   Wears glasses    Wears hearing aid in both ears     Past Surgical History:  Procedure Laterality Date   ABDOMINAL AORTOGRAM N/A 01/15/2021   Procedure: ABDOMINAL AORTOGRAM;  Surgeon: Serafina Mitchell, MD;  Location: [facility];  Service: Cardiovascular;  Laterality: N/A;   ABDOMINAL AORTOGRAM W/LOWER EXTREMITY Bilateral 12/11/2020   Procedure: ABDOMINAL AORTOGRAM  W/LOWER EXTREMITY;  Surgeon: Serafina Mitchell, MD;  Location: [facility];  Service: Cardiovascular;  Laterality: Bilateral;   ABDOMINAL AORTOGRAM W/LOWER EXTREMITY N/A 03/17/2022   Procedure: ABDOMINAL AORTOGRAM W/LOWER EXTREMITY;  Surgeon: Waynetta Sandy, MD;  Location: [facility];  Service: Cardiovascular;  Laterality: N/A;   ABDOMINAL AORTOGRAM W/LOWER EXTREMITY N/A 07/07/2022   Procedure: ABDOMINAL AORTOGRAM W/LOWER EXTREMITY;  Surgeon: Waynetta Sandy, MD;  Location: [place] [facility];  Service: Cardiovascular;  Laterality: N/A;   BASCILIC VEIN TRANSPOSITION Left 12/02/2019   Procedure: BASCILIC VEIN TRANSPOSITION LEFT FIRST STAGE;  Surgeon: Serafina Mitchell, MD;  Location: [facility];  Service: Vascular;  Laterality: Left;   [place] Left 02/10/2020   Procedure: BASCILIC VEIN TRANSPOSITION LEFT SECOND STAGE;  Surgeon: Serafina Mitchell, MD;  Location: [place];  Service: Vascular;  Laterality: Left;   BILIARY STENT PLACEMENT  11/25/2018   Procedure: BILIARY STENT PLACEMENT;  Surgeon: [name], MD;  Location: [facility];  Service: Gastroenterology;;   BIOPSY  12/20/2018   Procedure: BIOPSY;  Surgeon: [name], MD;  Location: [facility];  Service: Gastroenterology;;   BIOPSY  08/03/2020   Procedure: BIOPSY;  Surgeon: Otis Brace, MD;  Location: [facility];  Service: Gastroenterology;;  EGD and COLON   CARDIAC CATHETERIZATION     CHOLECYSTECTOMY N/A 11/14/2018   Procedure: LAPAROSCOPIC CHOLECYSTECTOMY WITH INTRAOPERATIVE CHOLANGIOGRAM;  Surgeon: Coralie Keens, MD;  Location: [facility];  Service: General;  Laterality: N/A;   COLONOSCOPY W/ BIOPSIES AND POLYPECTOMY     COLONOSCOPY WITH PROPOFOL N/A 08/03/2020   Procedure: COLONOSCOPY WITH PROPOFOL;  Surgeon: Otis Brace, MD;  Location: [facility];  Service: Gastroenterology;  Laterality: N/A;   ERCP N/A 11/25/2018   Procedure: ENDOSCOPIC RETROGRADE CHOLANGIOPANCREATOGRAPHY (ERCP);   Surgeon: [name], MD;  Location: [place];  Service: Gastroenterology;  Laterality: N/A;   ESOPHAGOGASTRODUODENOSCOPY N/A 12/20/2018   Procedure: ESOPHAGOGASTRODUODENOSCOPY (EGD);  Surgeon: [name], MD;  Location: [facility];  Service: Gastroenterology;  Laterality: N/A;   ESOPHAGOGASTRODUODENOSCOPY (EGD) WITH PROPOFOL N/A 08/03/2020   Procedure: ESOPHAGOGASTRODUODENOSCOPY (EGD) WITH PROPOFOL;  Surgeon: Otis Brace, MD;  Location: [facility];  Service: Gastroenterology;  Laterality: N/A;   EYE SURGERY     cataract B/L   HAND SURGERY Right    IR ANGIOGRAM SELECTIVE EACH ADDITIONAL VESSEL  05/30/2022   IR ANGIOGRAM SELECTIVE EACH ADDITIONAL VESSEL  05/30/2022   IR ANGIOGRAM SELECTIVE EACH ADDITIONAL VESSEL  05/30/2022   IR ANGIOGRAM SELECTIVE EACH ADDITIONAL VESSEL  05/30/2022   IR ANGIOGRAM SELECTIVE EACH ADDITIONAL VESSEL  05/30/2022   IR ANGIOGRAM SELECTIVE EACH ADDITIONAL VESSEL  05/30/2022   IR ANGIOGRAM VISCERAL SELECTIVE  05/30/2022   IR DIALY SHUNT INTRO NEEDLE/INTRACATH INITIAL W/IMG LEFT Left 08/20/2018   IR EMBO ART  VEN HEMORR LYMPH EXTRAV  INC GUIDE ROADMAPPING  05/30/2022   IR PERC CHOLECYSTOSTOMY  08/19/2018   IR RADIOLOGIST EVAL & MGMT  09/29/2018   IR US GUIDE VASC ACCESS RIGHT  05/30/2022   LOWER EXTREMITY ANGIOGRAPHY Left 01/15/2021   Procedure: Lower Extremity Angiography;  Surgeon: Serafina Mitchell, MD;  Location: [facility];  Service: Cardiovascular;  Laterality: Left;   MULTIPLE TOOTH EXTRACTIONS     PERIPHERAL VASCULAR BALLOON ANGIOPLASTY Right 12/11/2020   Procedure: PERIPHERAL VASCULAR BALLOON ANGIOPLASTY;  Surgeon: Serafina Mitchell, MD;  Location: [place] [facility];  Service: Cardiovascular;  Laterality: Right;   PERIPHERAL VASCULAR BALLOON ANGIOPLASTY Left 01/15/2021   Procedure: PERIPHERAL VASCULAR BALLOON ANGIOPLASTY;  Surgeon: Serafina Mitchell, MD;  Location: [facility] [place] [facility];  Service: Cardiovascular;  Laterality: Left;  PT   POLYPECTOMY  08/03/2020    Procedure: POLYPECTOMY;  Surgeon: Otis Brace, MD;  Location: [facility];  Service: Gastroenterology;;   RESECTION OF ARTERIOVENOUS FISTULA ANEURYSM Left 10/27/2019   Procedure: Resection Of Left Upper Arm  Arteriovenous Fistula Aneurysm Times Two;  Surgeon: Serafina Mitchell, MD;  Location: [place];  Service: Vascular;  Laterality: Left;   SHOULDER SURGERY Right    SPHINCTEROTOMY  11/25/2018   Procedure: SPHINCTEROTOMY;  Surgeon: [name], MD;  Location: [place];  Service: Gastroenterology;;   Lavell Islam REMOVAL  12/20/2018   Procedure: STENT REMOVAL;  Surgeon: [name], MD;  Location: [facility];  Service: Gastroenterology;;   TIBIA FRACTURE SURGERY      Allergies  Allergen Reactions   Avelox [Moxifloxacin Hcl] Other (See Comments)    Hallucinations    Gabapentin Other (See Comments)    Caused staggering and red feet   Metoprolol Other (See Comments)    Dropped pulse too low; was taken off of this by MD   Quinolones Other (See Comments)    unknown   Shellfish Allergy Nausea And Vomiting and Other (See Comments)    Made patient VERY nauseous and he developed stomach cramps   Sulfa Antibiotics Other (See Comments)    Allergy is from childhood (reaction not recalled)   Tetracyclines & Related  Other (See Comments)    Blisters on hands and arms   Adhesive [Tape] Itching and Rash    Paper tape only- skin cannot tolerate "heavy" tapes    Prior to Admission medications   Medication Sig Start Date End Date Taking? Authorizing Provider  aspirin EC 81 MG tablet Take 81 mg by mouth at bedtime.     [provider]  atorvastatin (LIPITOR) 10 MG tablet Take 10 mg by mouth at bedtime.    [provider]  buPROPion (WELLBUTRIN XL) 150 MG 24 hr tablet Take 150 mg by mouth every evening.    [provider]  cephALEXin (KEFLEX) 500 MG capsule Take 1 capsule (500 mg total) by mouth 3 (three) times daily. 06/18/22   Horton, Barbette Hair, MD  docusate sodium (COLACE)  100 MG capsule Take 100 mg by mouth 2 (two) times daily.     [provider]  escitalopram (LEXAPRO) 10 MG tablet Take 10 mg by mouth at bedtime.  08/30/15   [provider]  ferrous sulfate 325 (65 FE) MG tablet Take 1 tablet (325 mg total) by mouth every other day. 06/05/22 07/05/22  Elodia Florence., MD  fluticasone (FLONASE) 50 MCG/ACT nasal spray Place 1 spray into both nostrils daily as needed for allergies or rhinitis.    [provider]  lactulose, encephalopathy, (CHRONULAC) 10 GM/15ML SOLN Take 10 g by mouth every evening. 08/02/18   [provider]  levothyroxine (SYNTHROID, LEVOTHROID) 125 MCG tablet Take 125 mcg by mouth daily before breakfast.    [provider]  lidocaine-prilocaine (EMLA) cream Apply 1 application topically Every Tuesday,Thursday,and Saturday with dialysis. 08/02/18   [provider]  linaclotide (LINZESS) 290 MCG CAPS capsule Take 290 mcg by mouth every evening.     [provider]  loratadine (CLARITIN) 10 MG tablet Take 10 mg by mouth daily.     [provider]  montelukast (SINGULAIR) 10 MG tablet Take 10 mg every evening by mouth.    [provider]  nitroGLYCERIN (NITROSTAT) 0.4 MG SL tablet Place 0.4 mg under the tongue every 5 (five) minutes as needed for chest pain.    [provider]  oxyCODONE-acetaminophen (PERCOCET) 10-325 MG tablet Take 1 tablet by mouth every 4 (four) hours as needed for pain. 12/09/18   [provider]  PROAIR HFA 108 (90 Base) MCG/ACT inhaler Inhale 2 puffs into the lungs every 6 (six) hours as needed for shortness of breath. 05/27/20   [provider]  Propylene Glycol (SYSTANE BALANCE) 0.6 % SOLN Place 1 drop into both eyes daily as needed (dry eyes).    [provider]  sevelamer carbonate (RENVELA) 800 MG tablet Take 3 tablets (2,400 mg total) by mouth 3 (three) times daily with meals. Patient taking differently: Take  2,400 mg by mouth 2 (two) times daily with a meal. 08/24/18   Elgergawy, Silver Huguenin, MD    Social History   Socioeconomic History   Marital status: Married    Spouse name: Di Kindle   Number of children: Not on file   Years of education: Not on file   Highest education level: Not on file  Occupational History   Not on file  Tobacco Use   Smoking status: Never   Smokeless tobacco: Former    Types: Nurse, children's Use: Never used  Substance and Sexual Activity   Alcohol use: Not Currently   Drug use: No   Sexual activity:  Not on file  Other Topics Concern   Not on file  Social History Narrative   Not on file   Social Determinants of Health   Financial Resource Strain: Low Risk  (08/17/2018)   Overall Financial Resource Strain (CARDIA)    Difficulty of Paying Living Expenses: Not hard at all  Food Insecurity: Unknown (08/17/2018)   Hunger Vital Sign    Worried About Running Out of Food in the Last Year: Patient refused    [place] in the Last Year: Patient refused  Transportation Needs: Unknown (08/17/2018)   PRAPARE - Hydrologist (Medical): Patient refused    Lack of Transportation (Non-Medical): Patient refused  Physical Activity: Unknown (08/17/2018)   Exercise Vital Sign    Days of Exercise per Week: Patient refused    Minutes of Exercise per Session: Patient refused  Stress: No Stress Concern Present (08/17/2018)   [place]    Feeling of Stress : Only a little  Social Connections: Not on file  Intimate Partner Violence: Not on file     Family History  Problem Relation Age of Onset   Heart disease Mother    Heart disease Father    Heart disease Sister     ROS: '[x]'$  Positive   '[ ]'$  Negative   '[ ]'$  All sytems reviewed and are negative  Cardiovascular: '[]'$  chest pain/pressure '[]'$  palpitations '[]'$  SOB lying flat '[]'$  DOE '[]'$  pain in legs while walking '[]'$  pain  in legs at rest '[]'$  pain in legs at night '[]'$  non-healing ulcers '[]'$  hx of DVT '[]'$  swelling in legs  Pulmonary: '[]'$  productive cough '[]'$  asthma/wheezing '[]'$  home O2  Neurologic: '[]'$  weakness in '[]'$  arms '[]'$  legs '[]'$  numbness in '[]'$  arms '[]'$  legs '[]'$  hx of CVA '[]'$  mini stroke '[]'$ difficulty speaking or slurred speech '[]'$  temporary loss of vision in one eye '[]'$  dizziness  Hematologic: '[]'$  hx of cancer '[]'$  bleeding problems '[]'$  problems with blood clotting easily  Endocrine:   '[]'$  diabetes '[]'$  thyroid disease  GI '[]'$  vomiting blood '[]'$  blood in stool  GU: '[]'$  CKD/renal failure '[]'$  HD--'[]'$  M/W/F or '[]'$  T/T/S '[]'$  burning with urination '[]'$  blood in urine  Psychiatric: '[]'$  anxiety '[]'$  depression  Musculoskeletal: '[]'$  arthritis '[]'$  joint pain  Integumentary: '[]'$  rashes '[]'$  ulcers  Constitutional: '[]'$  fever '[]'$  chills   Physical Examination  Vitals:   07/10/22 1307 07/10/22 1315  BP: 112/65 102/65  Pulse: 84 80  Resp: 18 (!) 21  Temp:    SpO2: 100% 99%   There is no height or weight on file to calculate BMI.  General:  NAD Gait: Not observed HENT: WNL, normocephalic Pulmonary: normal non-labored breathing Cardiac: regular, without  Murmurs, rubs or gallops Abdomen:  soft, NT/ND Vascular Exam/Pulses: Bilateral femoral pulses palpable Right PT and peroneal brisk by Doppler Left PT brisk by Doppler Bilateral lower extremity tissue loss as pictured Musculoskeletal: no muscle wasting or atrophy  Neurologic: A&O X 3; Appropriate Affect ; SENSATION: normal; MOTOR FUNCTION:  moving all extremities equally. Speech is fluent/normal        CBC    Component Value Date/Time   WBC 5.9 07/10/2022 1052   RBC 4.03 (L) 07/10/2022 1052   HGB 12.9 (L) 07/10/2022 1112   HCT 38.0 (L) 07/10/2022 1112   PLT 249 07/10/2022 1052   MCV 95.3 07/10/2022 1052   MCH 28.3 07/10/2022 1052   MCHC 29.7 (L) 07/10/2022 1052  RDW 15.6 (H) 07/10/2022 1052   LYMPHSABS 0.7 07/10/2022 1052   MONOABS 0.6  07/10/2022 1052   EOSABS 0.1 07/10/2022 1052   BASOSABS 0.0 07/10/2022 1052    BMET    Component Value Date/Time   NA 134 (L) 07/10/2022 1112   K 4.2 07/10/2022 1112   CL 100 07/10/2022 1112   CO2 25 07/10/2022 1052   GLUCOSE 114 (H) 07/10/2022 1112   BUN 32 (H) 07/10/2022 1112   CREATININE 9.20 (H) 07/10/2022 1112   CALCIUM 9.3 07/10/2022 1052   GFRNONAA 6 (L) 07/10/2022 1052   GFRAA 7 (L) 10/26/2019 1626    COAGS: Lab Results  Component Value Date   INR 1.4 (H) 05/30/2022   INR 1.07 11/23/2018   INR 1.22 08/19/2018     Non-Invasive Vascular Imaging:    N/A   ASSESSMENT/PLAN: This is a  81 y.o. male with history of end-stage renal disease, COPD, perinephric hematoma, congestive heart failure with an EF of 20 to 25%, diabetes, peripheral arterial disease that vascular surgery has been consulted for bilateral lower extremity tissue loss.  He most recently underwent angiogram on 07/07/2022 with inline flow down both lower extremities with a peroneal/PT on the left and a PT on the right.  Unfortunately he has significant small vessel disease in the foot with his end-stage renal disease and diabetes.  His inflow is optimized from a vascular standpoint.  I do not see any evidence of wet gangrene.  His wounds are dry.  He has no pain in his feet.  He still has pretty brisk right PT/peroneal Doppler signal and a left PT signal.  This is progression of chronic disease.  He has previously discussed possible proximal amputation with Dr. Donzetta Matters.  He remembers Dr. Donzetta Matters saying to wait as long as possible.  Given that he has no pain and no signs of infection with dry gangrene, I think he can follow-up with Dr. Donzetta Matters as an outpatient.  The wife states the wounds have looked like this for several weeks and this has been ongoing for months.  I think toe amputations or foot debridement would be challenging given his extent of tissue loss but we will see if podiatry has anything to offer.  Marty Heck, MD [facility] Office: [place]

## 2022-07-10 NOTE — ED Provider Notes (Signed)
[facility] EMERGENCY DEPARTMENT Provider Note   CSN: [id number] Arrival date & time: 07/10/22  6546   History  No chief complaint on file.   Christian Dalton is a 81 y.o. male PMH anemia, A-fib, diabetes, ESRD on HD, hypertension who is presenting with concern for foot infection.  Patient's wife is at bedside, contributes to the history.  They report that patient has had trouble with foot wounds for "a wild" now.  He was recently seen by vascular surgery and underwent a procedure which demonstrated that he has very poor blood flow to his feet.  He was recommended aggressive wound care with podiatry following, and wife reports good adherence with this.  However, she says that his foot wounds have been worsening, and his toes have recently began turning black.  Patient denies any fevers, foot pain, paresthesias of the lower extremities, leg swelling.  Patient denies any other concerning symptoms at this time.    Home Medications Prior to Admission medications   Medication Sig Start Date End Date Taking? Authorizing Provider  aspirin EC 81 MG tablet Take 81 mg by mouth at bedtime.     [provider]  atorvastatin (LIPITOR) 10 MG tablet Take 10 mg by mouth at bedtime.    [provider]  buPROPion (WELLBUTRIN XL) 150 MG 24 hr tablet Take 150 mg by mouth every evening.    [provider]  cephALEXin (KEFLEX) 500 MG capsule Take 1 capsule (500 mg total) by mouth 3 (three) times daily. 06/18/22   Horton, Barbette Hair, MD  docusate sodium (COLACE) 100 MG capsule Take 100 mg by mouth 2 (two) times daily.     [provider]  escitalopram (LEXAPRO) 10 MG tablet Take 10 mg by mouth at bedtime.  08/30/15   [provider]  ferrous sulfate 325 (65 FE) MG tablet Take 1 tablet (325 mg total) by mouth every other day. 06/05/22 07/05/22  Elodia Florence., MD  fluticasone (FLONASE) 50 MCG/ACT nasal spray Place 1 spray into both nostrils daily as  needed for allergies or rhinitis.    [provider]  lactulose, encephalopathy, (CHRONULAC) 10 GM/15ML SOLN Take 10 g by mouth every evening. 08/02/18   [provider]  levothyroxine (SYNTHROID, LEVOTHROID) 125 MCG tablet Take 125 mcg by mouth daily before breakfast.    [provider]  lidocaine-prilocaine (EMLA) cream Apply 1 application topically Every Tuesday,Thursday,and Saturday with dialysis. 08/02/18   [provider]  linaclotide (LINZESS) 290 MCG CAPS capsule Take 290 mcg by mouth every evening.     [provider]  loratadine (CLARITIN) 10 MG tablet Take 10 mg by mouth daily.     [provider]  montelukast (SINGULAIR) 10 MG tablet Take 10 mg every evening by mouth.    [provider]  nitroGLYCERIN (NITROSTAT) 0.4 MG SL tablet Place 0.4 mg under the tongue every 5 (five) minutes as needed for chest pain.    [provider]  oxyCODONE-acetaminophen (PERCOCET) 10-325 MG tablet Take 1 tablet by mouth every 4 (four) hours as needed for pain. 12/09/18   [provider]  PROAIR HFA 108 (90 Base) MCG/ACT inhaler Inhale 2 puffs into the lungs every 6 (six) hours as needed for shortness of breath. 05/27/20   [provider]  Propylene Glycol (SYSTANE BALANCE) 0.6 % SOLN Place 1 drop into both eyes daily as needed (dry eyes).    [provider]  sevelamer carbonate (RENVELA) 800 MG tablet Take  3 tablets (2,400 mg total) by mouth 3 (three) times daily with meals. Patient taking differently: Take 2,400 mg by mouth 2 (two) times daily with a meal. 08/24/18   Elgergawy, Silver Huguenin, MD      Allergies    Avelox [moxifloxacin hcl], Gabapentin, Metoprolol, Quinolones, Shellfish allergy, Sulfa antibiotics, Tetracyclines & related, and Adhesive [tape]    Review of Systems   Review of Systems As in HPI.  Physical Exam Updated Vital Signs BP 112/65 (BP Location: Right Arm)   Pulse 84   Temp 98.5 F (36.9 C)  (Oral)   Resp 18   SpO2 100%  Physical Exam Vitals and nursing note reviewed.  Constitutional:      General: He is not in acute distress.    Appearance: Normal appearance. He is well-developed. He is not ill-appearing or diaphoretic.  HENT:     Head: Normocephalic and atraumatic.     Right Ear: External ear normal.     Left Ear: External ear normal.     Nose: Nose normal.  Cardiovascular:     Rate and Rhythm: Normal rate and regular rhythm.     Heart sounds: No murmur heard. Pulmonary:     Effort: Pulmonary effort is normal. No respiratory distress.     Breath sounds: Normal breath sounds. No wheezing.  Abdominal:     Palpations: Abdomen is soft.  Musculoskeletal:     Cervical back: Neck supple.  Skin:    Comments: Necrotic skin changes as noted in photos below on bilateral lower extremities.  Neurological:     Mental Status: He is alert.          ED Results / Procedures / Treatments   Labs (all labs ordered are listed, but only abnormal results are displayed) Labs Reviewed  COMPREHENSIVE METABOLIC PANEL - Abnormal; Notable for the following components:      Result Value   Glucose, Bld 115 (*)    BUN 33 (*)    Creatinine, Ser 8.28 (*)    Total Protein 5.5 (*)    Albumin 2.7 (*)    AST 9 (*)    GFR, Estimated 6 (*)    All other components within normal limits  CBC WITH DIFFERENTIAL/PLATELET - Abnormal; Notable for the following components:   RBC 4.03 (*)    Hemoglobin 11.4 (*)    HCT 38.4 (*)    MCHC 29.7 (*)    RDW 15.6 (*)    All other components within normal limits  I-STAT CHEM 8, ED - Abnormal; Notable for the following components:   Sodium 134 (*)    BUN 32 (*)    Creatinine, Ser 9.20 (*)    Glucose, Bld 114 (*)    Calcium, Ion 0.94 (*)    Hemoglobin 12.9 (*)    HCT 38.0 (*)    All other components within normal limits  CULTURE, BLOOD (ROUTINE X 2)  CULTURE, BLOOD (ROUTINE X 2)  LACTIC ACID, PLASMA    EKG None  Radiology DG Foot Complete  Right  Result Date: 07/10/2022 CLINICAL DATA:  Foot infection. EXAM: RIGHT FOOT COMPLETE - 3+ VIEW COMPARISON:  None Available. FINDINGS: There is no evidence of fracture or dislocation. There is no evidence of arthropathy or other focal bone abnormality. Vascular calcifications are noted. No lytic abnormality is noted. IMPRESSION: No acute abnormality seen. Electronically Signed   By: Marijo Conception M.D.   On: 07/10/2022 11:30   DG Foot Complete Left  Result Date: 07/10/2022 CLINICAL  DATA:  Foot infection. EXAM: LEFT FOOT - COMPLETE 3+ VIEW COMPARISON:  None Available. FINDINGS: There is no evidence of fracture or dislocation. There is no evidence of arthropathy or other focal bone abnormality. Vascular calcifications are noted. No lytic destruction is noted. IMPRESSION: No acute abnormality seen. Electronically Signed   By: Marijo Conception M.D.   On: 07/10/2022 11:28   DG Chest 2 View  Result Date: 07/10/2022 CLINICAL DATA:  Foot infection. EXAM: CHEST - 2 VIEW COMPARISON:  June 02, 2022. FINDINGS: Stable cardiomediastinal silhouette. No acute pulmonary disease is noted. Bony thorax is unremarkable. IMPRESSION: No active cardiopulmonary disease. Electronically Signed   By: Marijo Conception M.D.   On: 07/10/2022 11:27    Procedures Procedures   Medications Ordered in ED Medications - No data to display  ED Course/ Medical Decision Making/ A&P                           Medical Decision Making Amount and/or Complexity of Data Reviewed Labs: ordered.  Risk Prescription drug management. Decision regarding hospitalization.   Christian Dalton is a 81 y.o. male with significant PMHx PAD, T2DM who presented to the ED with concern for foot infection.  Vitals at presentation within normal limits.  Patient is hemodynamically stable, afebrile, satting well on room air.  Physical exam demonstrates normal heart sounds.  Lungs clear to auscultation bilaterally.  Abdomen is soft, nontender to palpation.   No pitting edema.  Initial differential includes but is not limited to: ischemic foot, osteomyelitis, cellulitis, sepsis, chronic PAD, chronic foot wound  Lab work obtained, resulted notable for CBC without leukocytosis.  Hemoglobin 11.4, consistent with baseline.  CMP notable for elevated BUN and creatinine, consistent with patient's known history of ESRD.  No elevated LFTs or anion gap.  Lactic within normal limits.  ESR and CRP pending.  Blood cultures pending.  Plain films of bilateral feet were obtained, resulted negative for evidence of acute osteomyelitis per radiology read.    Patient sent from podiatry clinic with concern for bilateral foot infection.  Per chart review, patient underwent aortogram with vascular surgery on 07/07/2022.  Vascular surgery was contacted, and reports that the patient has blood flow to his ankles, and they are not able to offer any additional interventions.  They feel that the patient is potentially safe for discharge home and outpatient follow-up.  They recommend consulting podiatry for further management including potential surgical options.  I have spoken with podiatry, who is planning to see the patient.  Their ultimate recommendations are pending at the time of signout.  Patient is afebrile, without leukocytosis, with a normal lactic.  I do not feel that he requires IV antibiotics at this time.  Ultimate disposition will depend on their final recommendations.  Plan of care was signed out to oncoming provider, Dr. Maryjo Rochester.  The plan for this patient was discussed with Dr. Kathrynn Humble, who voiced agreement and who oversaw evaluation and treatment of this patient.   Final Clinical Impression(s) / ED Diagnoses Final diagnoses:  Peripheral artery disease [facility])    Rx / DC Orders ED Discharge Orders     None         Faylene Million, MD 07/10/22 [place], [place], MD 07/12/22 [phone]

## 2022-07-10 NOTE — ED Provider Notes (Signed)
81 year old male with past medical history of atrial fibrillation, diabetes, ESRD on hemodialysis, peripheral artery disease, who presents to the emergency department for acute on chronic bilateral foot wounds.  Patient did not meet septic criteria and has had no recent fevers.  Podiatry was consulted due to likely impending need for toe amputation. Physical Exam  BP 116/77   Pulse 86   Temp 98.5 F (36.9 C) (Oral)   Resp 16   SpO2 100%   Physical Exam Vitals and nursing note reviewed.  Constitutional:      General: He is not in acute distress.    Appearance: He is well-developed.     Comments: Upon entering the exam room, the patient is lying in bed awake and alert no acute distress.  HENT:     Head: Normocephalic and atraumatic.  Eyes:     Conjunctiva/sclera: Conjunctivae normal.  Cardiovascular:     Rate and Rhythm: Normal rate and regular rhythm.     Heart sounds: No murmur heard. Pulmonary:     Effort: Pulmonary effort is normal. No respiratory distress.     Breath sounds: Normal breath sounds.  Abdominal:     Palpations: Abdomen is soft.     Tenderness: There is no abdominal tenderness.  Musculoskeletal:        General: No swelling.     Cervical back: Neck supple.  Skin:    General: Skin is warm and dry.     Capillary Refill: Capillary refill takes less than 2 seconds.  Neurological:     Mental Status: He is alert.  Psychiatric:        Mood and Affect: Mood normal.     Procedures  Procedures  ED Course / MDM    Medical Decision Making Amount and/or Complexity of Data Reviewed Labs: ordered.  Risk Prescription drug management. Decision regarding hospitalization.   I have discussed the patient's care with podiatry on-call who notes that the patient does not require any inpatient intervention or emergent surgical intervention at this time.  Per podiatry and vascular surgery, the patient is to be discharged on antibiotics and that they will follow him up in  clinic.  Patient was discharged in hemodynamically stable condition with prescription for clindamycin.         Levie Heritage, MD 07/10/22 2314    Margette Fast, MD 07/14/22 [phone]

## 2022-07-10 NOTE — ED Triage Notes (Signed)
Patient sent by podiatrist's office for concern of bilateral foot infection.Pt with gangrenous toes. Pt is diabetic.Patient denies any chills or fevers.

## 2022-07-14 ENCOUNTER — Telehealth: Payer: Self-pay | Admitting: *Deleted

## 2022-07-14 NOTE — Telephone Encounter (Signed)
Pt wife called regarding Rx prescribed "sitting in pt chest."  They would like liquid form or alternate Rx.  RNCM secure chatted EDP regarding this matter.

## 2022-07-15 LAB — CULTURE, BLOOD (ROUTINE X 2)
Culture: NO GROWTH
Culture: NO GROWTH
Special Requests: ADEQUATE

## 2022-07-23 ENCOUNTER — Encounter: Payer: Self-pay | Admitting: Vascular Surgery

## 2022-07-23 ENCOUNTER — Ambulatory Visit: Payer: Medicare Other | Admitting: Vascular Surgery

## 2022-07-23 VITALS — BP 112/65 | HR 87 | Temp 98.1°F | Resp 20 | Ht 70.0 in | Wt 205.0 lb

## 2022-07-23 DIAGNOSIS — I739 Peripheral vascular disease, unspecified: Secondary | ICD-10-CM

## 2022-07-23 DIAGNOSIS — Z992 Dependence on renal dialysis: Secondary | ICD-10-CM

## 2022-07-23 DIAGNOSIS — N186 End stage renal disease: Secondary | ICD-10-CM

## 2022-07-23 NOTE — Progress Notes (Signed)
Patient ID: Christian Dalton, male   DOB: 01-01-1941, 81 y.o.   MRN: [id number]  Reason for Consult: Follow-up   Referred by Maris Berger, MD  Subjective:     HPI:  Christian Dalton is a 81 y.o. male follows up for wounds on his bilateral feet.  He recently underwent angiography was not found to have any options for intervention.  He has undergone left posterior tibial artery angioplasty a year and a half ago.  He denies any fevers or chills.  States that his feet do not hurt at this time and is followed by podiatry.  His wife is concerned that he is worsening wounds and drainage from his bilateral toes.  Past Medical History:  Diagnosis Date   Acute cholecystitis 08/17/2018   Anemia    Arthritis    Atrial fibrillation (HCC)    CAD (coronary artery disease)    Cholecystitis    COPD (chronic obstructive pulmonary disease) (HCC)    Depression    Diabetes mellitus without complication (HCC)    ESRD (end stage renal disease) on dialysis (HCC)    HOH (hard of hearing)    Hypertension    Hypothyroidism    OSA (obstructive sleep apnea)    does not wear cpap   Spinal stenosis    Wears dentures    wears top dentures   Wears glasses    Wears hearing aid in both ears    Family History  Problem Relation Age of Onset   Heart disease Mother    Heart disease Father    Heart disease Sister    Past Surgical History:  Procedure Laterality Date   ABDOMINAL AORTOGRAM N/A 01/15/2021   Procedure: ABDOMINAL AORTOGRAM;  Surgeon: Serafina Mitchell, MD;  Location: [place] [facility];  Service: Cardiovascular;  Laterality: N/A;   ABDOMINAL AORTOGRAM W/LOWER EXTREMITY Bilateral 12/11/2020   Procedure: ABDOMINAL AORTOGRAM W/LOWER EXTREMITY;  Surgeon: Serafina Mitchell, MD;  Location: [place] [facility];  Service: Cardiovascular;  Laterality: Bilateral;   ABDOMINAL AORTOGRAM W/LOWER EXTREMITY N/A 03/17/2022   Procedure: ABDOMINAL AORTOGRAM W/LOWER EXTREMITY;  Surgeon: Waynetta Sandy, MD;   Location: [place] [facility];  Service: Cardiovascular;  Laterality: N/A;   ABDOMINAL AORTOGRAM W/LOWER EXTREMITY N/A 07/07/2022   Procedure: ABDOMINAL AORTOGRAM W/LOWER EXTREMITY;  Surgeon: Waynetta Sandy, MD;  Location: [place] [facility];  Service: Cardiovascular;  Laterality: N/A;   BASCILIC VEIN TRANSPOSITION Left 12/02/2019   Procedure: BASCILIC VEIN TRANSPOSITION LEFT FIRST STAGE;  Surgeon: Serafina Mitchell, MD;  Location: [place];  Service: Vascular;  Laterality: Left;   [place] Left 02/10/2020   Procedure: BASCILIC VEIN TRANSPOSITION LEFT SECOND STAGE;  Surgeon: Serafina Mitchell, MD;  Location: [place];  Service: Vascular;  Laterality: Left;   BILIARY STENT PLACEMENT  11/25/2018   Procedure: [place];  Surgeon: [name], MD;  Location: [facility];  Service: Gastroenterology;;   BIOPSY  12/20/2018   Procedure: BIOPSY;  Surgeon: [name], MD;  Location: [facility];  Service: Gastroenterology;;   BIOPSY  08/03/2020   Procedure: BIOPSY;  Surgeon: Otis Brace, MD;  Location: [facility];  Service: Gastroenterology;;  EGD and COLON   CARDIAC CATHETERIZATION     CHOLECYSTECTOMY N/A 11/14/2018   Procedure: LAPAROSCOPIC CHOLECYSTECTOMY WITH INTRAOPERATIVE CHOLANGIOGRAM;  Surgeon: Coralie Keens, MD;  Location: [place];  Service: General;  Laterality: N/A;   COLONOSCOPY W/ BIOPSIES AND POLYPECTOMY     COLONOSCOPY WITH PROPOFOL N/A 08/03/2020   Procedure:  COLONOSCOPY WITH PROPOFOL;  Surgeon: Otis Brace, MD;  Location: [facility];  Service: Gastroenterology;  Laterality: N/A;   ERCP N/A 11/25/2018   Procedure: ENDOSCOPIC RETROGRADE CHOLANGIOPANCREATOGRAPHY (ERCP);  Surgeon: [name], MD;  Location: [place];  Service: Gastroenterology;  Laterality: N/A;   ESOPHAGOGASTRODUODENOSCOPY N/A 12/20/2018   Procedure: ESOPHAGOGASTRODUODENOSCOPY (EGD);  Surgeon: [name], MD;  Location: [facility];  Service: Gastroenterology;  Laterality:  N/A;   ESOPHAGOGASTRODUODENOSCOPY (EGD) WITH PROPOFOL N/A 08/03/2020   Procedure: ESOPHAGOGASTRODUODENOSCOPY (EGD) WITH PROPOFOL;  Surgeon: Otis Brace, MD;  Location: [facility];  Service: Gastroenterology;  Laterality: N/A;   EYE SURGERY     cataract B/L   HAND SURGERY Right    IR ANGIOGRAM SELECTIVE EACH ADDITIONAL VESSEL  05/30/2022   IR ANGIOGRAM SELECTIVE EACH ADDITIONAL VESSEL  05/30/2022   IR ANGIOGRAM SELECTIVE EACH ADDITIONAL VESSEL  05/30/2022   IR ANGIOGRAM SELECTIVE EACH ADDITIONAL VESSEL  05/30/2022   IR ANGIOGRAM SELECTIVE EACH ADDITIONAL VESSEL  05/30/2022   IR ANGIOGRAM SELECTIVE EACH ADDITIONAL VESSEL  05/30/2022   IR ANGIOGRAM VISCERAL SELECTIVE  05/30/2022   IR DIALY SHUNT INTRO NEEDLE/INTRACATH INITIAL W/IMG LEFT Left 08/20/2018   IR EMBO ART  VEN HEMORR LYMPH EXTRAV  INC GUIDE ROADMAPPING  05/30/2022   IR PERC CHOLECYSTOSTOMY  08/19/2018   IR RADIOLOGIST EVAL & MGMT  09/29/2018   IR US GUIDE VASC ACCESS RIGHT  05/30/2022   LOWER EXTREMITY ANGIOGRAPHY Left 01/15/2021   Procedure: Lower Extremity Angiography;  Surgeon: Serafina Mitchell, MD;  Location: [place] [facility];  Service: Cardiovascular;  Laterality: Left;   MULTIPLE TOOTH EXTRACTIONS     PERIPHERAL VASCULAR BALLOON ANGIOPLASTY Right 12/11/2020   Procedure: PERIPHERAL VASCULAR BALLOON ANGIOPLASTY;  Surgeon: Serafina Mitchell, MD;  Location: [place] [facility];  Service: Cardiovascular;  Laterality: Right;   PERIPHERAL VASCULAR BALLOON ANGIOPLASTY Left 01/15/2021   Procedure: PERIPHERAL VASCULAR BALLOON ANGIOPLASTY;  Surgeon: Serafina Mitchell, MD;  Location: [place] [facility];  Service: Cardiovascular;  Laterality: Left;  PT   POLYPECTOMY  08/03/2020   Procedure: POLYPECTOMY;  Surgeon: Otis Brace, MD;  Location: [facility];  Service: Gastroenterology;;   RESECTION OF ARTERIOVENOUS FISTULA ANEURYSM Left 10/27/2019   Procedure: Resection Of Left Upper Arm  Arteriovenous Fistula Aneurysm Times Two;  Surgeon: Serafina Mitchell, MD;  Location: [place];  Service: Vascular;  Laterality: Left;   SHOULDER SURGERY Right    SPHINCTEROTOMY  11/25/2018   Procedure: SPHINCTEROTOMY;  Surgeon: [name], MD;  Location: [place];  Service: Gastroenterology;;   Lavell Islam REMOVAL  12/20/2018   Procedure: STENT REMOVAL;  Surgeon: [name], MD;  Location: [facility];  Service: Gastroenterology;;   TIBIA FRACTURE SURGERY      Short Social History:  Social History   Tobacco Use   Smoking status: Never   Smokeless tobacco: Former    Types: Chew  Substance Use Topics   Alcohol use: Not Currently    Allergies  Allergen Reactions   Avelox [Moxifloxacin Hcl] Other (See Comments)    Hallucinations    Gabapentin Other (See Comments)    Caused staggering and red feet   Metoprolol Other (See Comments)    Dropped pulse too low; was taken off of this by MD   Quinolones Other (See Comments)    unknown   Shellfish Allergy Nausea And Vomiting and Other (See Comments)    Made patient VERY nauseous and he developed stomach cramps   Sulfa Antibiotics Other (See Comments)    Allergy is  from childhood (reaction not recalled)   Tetracyclines & Related Other (See Comments)    Blisters on hands and arms   Adhesive [Tape] Itching and Rash    Paper tape only- skin cannot tolerate "heavy" tapes    Current Outpatient Medications  Medication Sig Dispense Refill   aspirin EC 81 MG tablet Take 81 mg by mouth at bedtime.      atorvastatin (LIPITOR) 10 MG tablet Take 10 mg by mouth at bedtime.     buPROPion (WELLBUTRIN XL) 150 MG 24 hr tablet Take 150 mg by mouth every evening.     cephALEXin (KEFLEX) 500 MG capsule Take 1 capsule (500 mg total) by mouth 3 (three) times daily. 21 capsule 0   docusate sodium (COLACE) 100 MG capsule Take 100 mg by mouth 2 (two) times daily.      escitalopram (LEXAPRO) 10 MG tablet Take 10 mg by mouth at bedtime.      fluticasone (FLONASE) 50 MCG/ACT nasal spray Place 1 spray into both nostrils daily as  needed for allergies or rhinitis.     lactulose, encephalopathy, (CHRONULAC) 10 GM/15ML SOLN Take 10 g by mouth every evening.  4   levothyroxine (SYNTHROID, LEVOTHROID) 125 MCG tablet Take 125 mcg by mouth daily before breakfast.     lidocaine-prilocaine (EMLA) cream Apply 1 application topically Every Tuesday,Thursday,and Saturday with dialysis.  4   linaclotide (LINZESS) 290 MCG CAPS capsule Take 290 mcg by mouth every evening.      lisinopril (ZESTRIL) 2.5 MG tablet Take 2.5 mg by mouth daily.     loratadine (CLARITIN) 10 MG tablet Take 10 mg by mouth daily.      montelukast (SINGULAIR) 10 MG tablet Take 10 mg every evening by mouth.     nitroGLYCERIN (NITROSTAT) 0.4 MG SL tablet Place 0.4 mg under the tongue every 5 (five) minutes as needed for chest pain.     oxyCODONE-acetaminophen (PERCOCET) 10-325 MG tablet Take 1 tablet by mouth every 4 (four) hours as needed for pain.     PROAIR HFA 108 (90 Base) MCG/ACT inhaler Inhale 2 puffs into the lungs every 6 (six) hours as needed for shortness of breath.     Propylene Glycol (SYSTANE BALANCE) 0.6 % SOLN Place 1 drop into both eyes daily as needed (dry eyes).     sevelamer carbonate (RENVELA) 800 MG tablet Take 3 tablets (2,400 mg total) by mouth 3 (three) times daily with meals. (Patient taking differently: Take 2,400 mg by mouth 2 (two) times daily with a meal.) 45 tablet 0   ferrous sulfate 325 (65 FE) MG tablet Take 1 tablet (325 mg total) by mouth every other day. (Patient taking differently: Take 325 mg by mouth daily after supper.) 15 tablet 0   No current facility-administered medications for this visit.    Review of Systems  Constitutional:  Constitutional negative. HENT: HENT negative.  Eyes: Eyes negative.  Cardiovascular: Cardiovascular negative.  GI: Gastrointestinal negative.  Musculoskeletal: Musculoskeletal negative.  Skin: Positive for wound.  Neurological: Neurological negative. Hematologic: Hematologic/lymphatic  negative.  Psychiatric: Psychiatric negative.        Objective:  Objective   Vitals:   07/23/22 1011  BP: 112/65  Pulse: 87  Resp: 20  Temp: 98.1 F (36.7 C)  SpO2: 95%  Weight: 205 lb (93 kg)  Height: '5\' 10"'$  (1.778 m)   Body mass index is 29.41 kg/m.  Physical Exam HENT:     Head: Normocephalic.  Cardiovascular:     Pulses:  Popliteal pulses are 2+ on the right side and 2+ on the left side.       Dorsalis pedis pulses are 0 on the right side and 0 on the left side.       Posterior tibial pulses are 0 on the right side and 0 on the left side.  Pulmonary:     Effort: Pulmonary effort is normal.  Abdominal:     General: Abdomen is flat.     Palpations: Abdomen is soft.  Skin:    Capillary Refill: Capillary refill takes 2 to 3 seconds.     Comments: Stable wounds bilateral feet  Neurological:     Mental Status: He is alert.     Data: No studies today     Assessment/Plan:     81 year old male with end-stage renal disease bilateral foot wounds remain quite stable although there is drainage there is no evidence of infection on exam today he is not systemically ill and does not have any pain in his feet.  As such I have recommended continued dressing changes until the patient does have concerns of infection or pain of which he would benefit from bilateral above-knee amputations.  Patient certainly hopeful for below-knee amputations but I quoted him a very high risk of failure of that operation particularly bilaterally.  He demonstrates good understanding I will see him back in a few weeks unless he has issues before that which she can certainly be scheduled for amputation to either be seen again or directly scheduled for surgery if necessary.  All questions answered both the patient and his wife today.     Waynetta Sandy MD [facility]

## 2022-08-13 ENCOUNTER — Encounter: Payer: Self-pay | Admitting: Vascular Surgery

## 2022-08-13 ENCOUNTER — Ambulatory Visit (INDEPENDENT_AMBULATORY_CARE_PROVIDER_SITE_OTHER): Payer: Medicare Other | Admitting: Vascular Surgery

## 2022-08-13 VITALS — BP 107/44 | HR 84 | Temp 97.8°F | Resp 20 | Ht 70.0 in | Wt 205.0 lb

## 2022-08-13 DIAGNOSIS — I70263 Atherosclerosis of native arteries of extremities with gangrene, bilateral legs: Secondary | ICD-10-CM | POA: Diagnosis not present

## 2022-08-13 NOTE — Progress Notes (Signed)
Patient ID: Christian Dalton, male   DOB: December 22, 1940, 81 y.o.   MRN: [id number]  Reason for Consult: Follow-up   Referred by Maris Berger, MD  Subjective:     HPI:  Gustavo Dispenza is a 81 y.o. male follows up for bilateral lower extremity dry gangrene without options for lower extremity revascularization.  He did continues to deny fevers or chills.  At last visit we had discussed likely above-knee amputation is necessary should he have pain or infection but currently is without pain continues to walk.  Past Medical History:  Diagnosis Date   Acute cholecystitis 08/17/2018   Anemia    Arthritis    Atrial fibrillation (HCC)    CAD (coronary artery disease)    Cholecystitis    COPD (chronic obstructive pulmonary disease) (HCC)    Depression    Diabetes mellitus without complication (HCC)    ESRD (end stage renal disease) on dialysis (HCC)    HOH (hard of hearing)    Hypertension    Hypothyroidism    OSA (obstructive sleep apnea)    does not wear cpap   Spinal stenosis    Wears dentures    wears top dentures   Wears glasses    Wears hearing aid in both ears    Family History  Problem Relation Age of Onset   Heart disease Mother    Heart disease Father    Heart disease Sister    Past Surgical History:  Procedure Laterality Date   ABDOMINAL AORTOGRAM N/A 01/15/2021   Procedure: ABDOMINAL AORTOGRAM;  Surgeon: Serafina Mitchell, MD;  Location: [place] [facility];  Service: Cardiovascular;  Laterality: N/A;   ABDOMINAL AORTOGRAM W/LOWER EXTREMITY Bilateral 12/11/2020   Procedure: ABDOMINAL AORTOGRAM W/LOWER EXTREMITY;  Surgeon: Serafina Mitchell, MD;  Location: [place] [facility];  Service: Cardiovascular;  Laterality: Bilateral;   ABDOMINAL AORTOGRAM W/LOWER EXTREMITY N/A 03/17/2022   Procedure: ABDOMINAL AORTOGRAM W/LOWER EXTREMITY;  Surgeon: Waynetta Sandy, MD;  Location: [place] [facility];  Service: Cardiovascular;  Laterality: N/A;   ABDOMINAL AORTOGRAM W/LOWER  EXTREMITY N/A 07/07/2022   Procedure: ABDOMINAL AORTOGRAM W/LOWER EXTREMITY;  Surgeon: Waynetta Sandy, MD;  Location: [place] [facility];  Service: Cardiovascular;  Laterality: N/A;   BASCILIC VEIN TRANSPOSITION Left 12/02/2019   Procedure: BASCILIC VEIN TRANSPOSITION LEFT FIRST STAGE;  Surgeon: Serafina Mitchell, MD;  Location: [place];  Service: Vascular;  Laterality: Left;   [place] Left 02/10/2020   Procedure: BASCILIC VEIN TRANSPOSITION LEFT SECOND STAGE;  Surgeon: Serafina Mitchell, MD;  Location: [place];  Service: Vascular;  Laterality: Left;   BILIARY STENT PLACEMENT  11/25/2018   Procedure: [place];  Surgeon: [name], MD;  Location: [facility];  Service: Gastroenterology;;   BIOPSY  12/20/2018   Procedure: BIOPSY;  Surgeon: [name], MD;  [facility];  Service: Gastroenterology;;   BIOPSY  08/03/2020   Procedure: BIOPSY;  Surgeon: Otis Brace, MD;  [facility]: [facility];  Service: Gastroenterology;;  EGD and COLON   CARDIAC CATHETERIZATION     CHOLECYSTECTOMY N/A 11/14/2018   Procedure: LAPAROSCOPIC CHOLECYSTECTOMY WITH INTRAOPERATIVE CHOLANGIOGRAM;  Surgeon: Coralie Keens, MD;  Location: [place];  Service: General;  Laterality: N/A;   COLONOSCOPY W/ BIOPSIES AND POLYPECTOMY     COLONOSCOPY WITH PROPOFOL N/A 08/03/2020   Procedure: COLONOSCOPY WITH PROPOFOL;  Surgeon: Otis Brace, MD;  [facility];  Service: Gastroenterology;  Laterality: N/A;   ERCP N/A 11/25/2018   Procedure: ENDOSCOPIC  RETROGRADE CHOLANGIOPANCREATOGRAPHY (ERCP);  Surgeon: [name], MD;  Location: [place];  Service: Gastroenterology;  Laterality: N/A;   ESOPHAGOGASTRODUODENOSCOPY N/A 12/20/2018   Procedure: ESOPHAGOGASTRODUODENOSCOPY (EGD);  Surgeon: [name], MD;  Location: [facility];  Service: Gastroenterology;  Laterality: N/A;   ESOPHAGOGASTRODUODENOSCOPY (EGD) WITH PROPOFOL N/A 08/03/2020   Procedure:  ESOPHAGOGASTRODUODENOSCOPY (EGD) WITH PROPOFOL;  Surgeon: Otis Brace, MD;  [facility];  Service: Gastroenterology;  Laterality: N/A;   EYE SURGERY     cataract B/L   HAND SURGERY Right    IR ANGIOGRAM SELECTIVE EACH ADDITIONAL VESSEL  05/30/2022   IR ANGIOGRAM SELECTIVE EACH ADDITIONAL VESSEL  05/30/2022   IR ANGIOGRAM SELECTIVE EACH ADDITIONAL VESSEL  05/30/2022   IR ANGIOGRAM SELECTIVE EACH ADDITIONAL VESSEL  05/30/2022   IR ANGIOGRAM SELECTIVE EACH ADDITIONAL VESSEL  05/30/2022   IR ANGIOGRAM SELECTIVE EACH ADDITIONAL VESSEL  05/30/2022   IR ANGIOGRAM VISCERAL SELECTIVE  05/30/2022   IR DIALY SHUNT INTRO NEEDLE/INTRACATH INITIAL W/IMG LEFT Left 08/20/2018   IR EMBO ART  VEN HEMORR LYMPH EXTRAV  INC GUIDE ROADMAPPING  05/30/2022   IR PERC CHOLECYSTOSTOMY  08/19/2018   IR RADIOLOGIST EVAL & MGMT  09/29/2018   IR US GUIDE VASC ACCESS RIGHT  05/30/2022   LOWER EXTREMITY ANGIOGRAPHY Left 01/15/2021   Procedure: Lower Extremity Angiography;  Surgeon: Serafina Mitchell, MD;  Location: [place] [facility];  Service: Cardiovascular;  Laterality: Left;   MULTIPLE TOOTH EXTRACTIONS     PERIPHERAL VASCULAR BALLOON ANGIOPLASTY Right 12/11/2020   Procedure: PERIPHERAL VASCULAR BALLOON ANGIOPLASTY;  Surgeon: Serafina Mitchell, MD;  Location: [place] [facility];  Service: Cardiovascular;  Laterality: Right;   PERIPHERAL VASCULAR BALLOON ANGIOPLASTY Left 01/15/2021   Procedure: PERIPHERAL VASCULAR BALLOON ANGIOPLASTY;  Surgeon: Serafina Mitchell, MD;  Location: [place] [facility];  Service: Cardiovascular;  Laterality: Left;  PT   POLYPECTOMY  08/03/2020   Procedure: POLYPECTOMY;  Surgeon: Otis Brace, MD;  [facility];  Service: Gastroenterology;;   RESECTION OF ARTERIOVENOUS FISTULA ANEURYSM Left 10/27/2019   Procedure: Resection Of Left Upper Arm  Arteriovenous Fistula Aneurysm Times Two;  Surgeon: Serafina Mitchell, MD;  Location: [place];  Service: Vascular;  Laterality: Left;   SHOULDER SURGERY  Right    SPHINCTEROTOMY  11/25/2018   Procedure: SPHINCTEROTOMY;  Surgeon: [name], MD;  Location: [facility];  Service: Gastroenterology;;   Lavell Islam REMOVAL  12/20/2018   Procedure: STENT REMOVAL;  Surgeon: [name], MD;  [facility];  Service: Gastroenterology;;   TIBIA FRACTURE SURGERY      Short Social History:  Social History   Tobacco Use   Smoking status: Never   Smokeless tobacco: Former    Types: Chew  Substance Use Topics   Alcohol use: Not Currently    Allergies  Allergen Reactions   Avelox [Moxifloxacin Hcl] Other (See Comments)    Hallucinations    Gabapentin Other (See Comments)    Caused staggering and red feet   Metoprolol Other (See Comments)    Dropped pulse too low; was taken off of this by MD   Quinolones Other (See Comments)    unknown   Shellfish Allergy Nausea And Vomiting and Other (See Comments)    Made patient VERY nauseous and he developed stomach cramps   Sulfa Antibiotics Other (See Comments)    Allergy is from childhood (reaction not recalled)   Tetracyclines & Related Other (See Comments)    Blisters on hands and arms   Adhesive [Tape] Itching and  Rash    Paper tape only- skin cannot tolerate "heavy" tapes    Current Outpatient Medications  Medication Sig Dispense Refill   aspirin EC 81 MG tablet Take 81 mg by mouth at bedtime.      atorvastatin (LIPITOR) 10 MG tablet Take 10 mg by mouth at bedtime.     buPROPion (WELLBUTRIN XL) 150 MG 24 hr tablet Take 150 mg by mouth every evening.     cephALEXin (KEFLEX) 500 MG capsule Take 1 capsule (500 mg total) by mouth 3 (three) times daily. 21 capsule 0   docusate sodium (COLACE) 100 MG capsule Take 100 mg by mouth 2 (two) times daily.      escitalopram (LEXAPRO) 10 MG tablet Take 10 mg by mouth at bedtime.      fluticasone (FLONASE) 50 MCG/ACT nasal spray Place 1 spray into both nostrils daily as needed for allergies or rhinitis.     lactulose, encephalopathy, (CHRONULAC) 10  GM/15ML SOLN Take 10 g by mouth every evening.  4   levothyroxine (SYNTHROID, LEVOTHROID) 125 MCG tablet Take 125 mcg by mouth daily before breakfast.     lidocaine-prilocaine (EMLA) cream Apply 1 application topically Every Tuesday,Thursday,and Saturday with dialysis.  4   linaclotide (LINZESS) 290 MCG CAPS capsule Take 290 mcg by mouth every evening.      lisinopril (ZESTRIL) 2.5 MG tablet Take 2.5 mg by mouth daily.     loratadine (CLARITIN) 10 MG tablet Take 10 mg by mouth daily.      montelukast (SINGULAIR) 10 MG tablet Take 10 mg every evening by mouth.     nitroGLYCERIN (NITROSTAT) 0.4 MG SL tablet Place 0.4 mg under the tongue every 5 (five) minutes as needed for chest pain.     oxyCODONE-acetaminophen (PERCOCET) 10-325 MG tablet Take 1 tablet by mouth every 4 (four) hours as needed for pain.     PROAIR HFA 108 (90 Base) MCG/ACT inhaler Inhale 2 puffs into the lungs every 6 (six) hours as needed for shortness of breath.     Propylene Glycol (SYSTANE BALANCE) 0.6 % SOLN Place 1 drop into both eyes daily as needed (dry eyes).     sevelamer carbonate (RENVELA) 800 MG tablet Take 3 tablets (2,400 mg total) by mouth 3 (three) times daily with meals. (Patient taking differently: Take 2,400 mg by mouth 2 (two) times daily with a meal.) 45 tablet 0   ferrous sulfate 325 (65 FE) MG tablet Take 1 tablet (325 mg total) by mouth every other day. (Patient taking differently: Take 325 mg by mouth daily after supper.) 15 tablet 0   No current facility-administered medications for this visit.    Review of Systems  Constitutional:  Constitutional negative. HENT: HENT negative.  Eyes: Eyes negative.  Respiratory: Respiratory negative.  Cardiovascular: Positive for leg swelling.  GI: Gastrointestinal negative.  Skin: Positive for wound.  Neurological: Neurological negative. Hematologic: Hematologic/lymphatic negative.  Psychiatric: Psychiatric negative.        Objective:  Objective   Vitals:    08/13/22 1014  BP: (!) 107/44  Pulse: 84  Resp: 20  Temp: 97.8 F (36.6 C)  SpO2: 94%  Weight: 205 lb (93 kg)  Height: '5\' 10"'$  (1.778 m)   Body mass index is 29.41 kg/m.  Physical Exam HENT:     Head: Normocephalic.     Nose: Nose normal.  Eyes:     Pupils: Pupils are equal, round, and reactive to light.  Cardiovascular:     Rate and Rhythm: Normal  rate.  Abdominal:     General: Abdomen is flat.  Musculoskeletal:     Right lower leg: Edema present.     Left lower leg: Edema present.     Comments: Strong left upper extremity thrill, left forearm and hand swelling Extensive dry gangrene changes of the right foot on the lateral aspect tracking back to the heel and also involving multiple toes Gangrene of the left toes without any evidence of cellulitis  Neurological:     Mental Status: He is alert.     Data: No studies today     Assessment/Plan:    81 year old male with bilateral chronic limb threatening ischemia with dry gangrene without options for revascularization.  We discussed the likely need for above-knee amputation in the near future but currently without pain or infection there is no indication.  He can follow-up with me on an as-needed basis or if he changes his mind regarding amputation.  Certainly if he has cellulitis he will need admission to the hospitalist and we can consider amputation at that time.  All questions were answered in the presence of his wife.     Waynetta Sandy MD [facility]

## 2022-08-26 ENCOUNTER — Telehealth: Payer: Self-pay

## 2022-08-26 NOTE — Telephone Encounter (Signed)
Caryl Pina at [facility] called stating that the pt has an appt with Dr Donzetta Matters tomorrow, but wanted to give some additional information that may be helpful should there be a surgery involved in his future.  She states he has sores on his fistula that have been slow to heal along with increased swelling. He is only receiving 2-3 hours of HD treatment and most of the time he chooses to have it run closer to 2 hours. He has had elevated K levels, last one being 5.1.

## 2022-08-27 ENCOUNTER — Encounter: Payer: Self-pay | Admitting: Vascular Surgery

## 2022-08-27 ENCOUNTER — Other Ambulatory Visit: Payer: Self-pay

## 2022-08-27 ENCOUNTER — Ambulatory Visit (INDEPENDENT_AMBULATORY_CARE_PROVIDER_SITE_OTHER): Payer: Medicare Other | Admitting: Vascular Surgery

## 2022-08-27 VITALS — BP 118/68 | HR 63 | Temp 97.9°F | Resp 20 | Ht 70.0 in | Wt 205.0 lb

## 2022-08-27 DIAGNOSIS — I739 Peripheral vascular disease, unspecified: Secondary | ICD-10-CM

## 2022-08-27 DIAGNOSIS — I70263 Atherosclerosis of native arteries of extremities with gangrene, bilateral legs: Secondary | ICD-10-CM

## 2022-08-27 DIAGNOSIS — N186 End stage renal disease: Secondary | ICD-10-CM | POA: Diagnosis not present

## 2022-08-27 NOTE — Progress Notes (Signed)
Patient ID: Christian Dalton, male   DOB: [birth year], 81 y.o.   MRN: [id number]  Reason for Consult: Follow-up   Referred by Maris Berger, MD  Subjective:     HPI:  Christian Dalton is a 81 y.o. male follows up for wounds of the bilateral lower extremities.  He has worsening wounds mostly on the right with foul smell.  He has not had any fevers or chills with the wife is certainly concerned about the smell.  He is not on antibiotics at this time.  He is here to again to discuss amputation.  Past Medical History:  Diagnosis Date   Acute cholecystitis 08/17/2018   Anemia    Arthritis    Atrial fibrillation (HCC)    CAD (coronary artery disease)    Cholecystitis    COPD (chronic obstructive pulmonary disease) (HCC)    Depression    Diabetes mellitus without complication (HCC)    ESRD (end stage renal disease) on dialysis (HCC)    HOH (hard of hearing)    Hypertension    Hypothyroidism    OSA (obstructive sleep apnea)    does not wear cpap   Spinal stenosis    Wears dentures    wears top dentures   Wears glasses    Wears hearing aid in both ears    Family History  Problem Relation Age of Onset   Heart disease Mother    Heart disease Father    Heart disease Sister    Past Surgical History:  Procedure Laterality Date   ABDOMINAL AORTOGRAM N/A 01/15/2021   Procedure: ABDOMINAL AORTOGRAM;  Surgeon: Serafina Mitchell, MD;  Location: [place] [facility];  Service: Cardiovascular;  Laterality: N/A;   ABDOMINAL AORTOGRAM W/LOWER EXTREMITY Bilateral 12/11/2020   Procedure: ABDOMINAL AORTOGRAM W/LOWER EXTREMITY;  Surgeon: Serafina Mitchell, MD;  Location: [place] [facility];  Service: Cardiovascular;  Laterality: Bilateral;   ABDOMINAL AORTOGRAM W/LOWER EXTREMITY N/A 03/17/2022   Procedure: ABDOMINAL AORTOGRAM W/LOWER EXTREMITY;  Surgeon: Waynetta Sandy, MD;  Location: [place] [facility];  Service: Cardiovascular;  Laterality: N/A;   ABDOMINAL AORTOGRAM W/LOWER EXTREMITY N/A  07/07/2022   Procedure: ABDOMINAL AORTOGRAM W/LOWER EXTREMITY;  Surgeon: Waynetta Sandy, MD;  Location: [place] [facility];  Service: Cardiovascular;  Laterality: N/A;   BASCILIC VEIN TRANSPOSITION Left 12/02/2019   Procedure: BASCILIC VEIN TRANSPOSITION LEFT FIRST STAGE;  Surgeon: Serafina Mitchell, MD;  Location: [place];  Service: Vascular;  Laterality: Left;   [place] Left 02/10/2020   Procedure: BASCILIC VEIN TRANSPOSITION LEFT SECOND STAGE;  Surgeon: Serafina Mitchell, MD;  Location: [place];  Service: Vascular;  Laterality: Left;   BILIARY STENT PLACEMENT  11/25/2018   Procedure: [place];  Surgeon: [name], MD;  Location: [facility];  Service: Gastroenterology;;   BIOPSY  12/20/2018   Procedure: BIOPSY;  Surgeon: [name], MD;  [facility];  Service: Gastroenterology;;   BIOPSY  08/03/2020   Procedure: BIOPSY;  Surgeon: Otis Brace, MD;  [facility];  Service: Gastroenterology;;  EGD and COLON   CARDIAC CATHETERIZATION     CHOLECYSTECTOMY N/A 11/14/2018   Procedure: LAPAROSCOPIC CHOLECYSTECTOMY WITH INTRAOPERATIVE CHOLANGIOGRAM;  Surgeon: Coralie Keens, MD;  Location: [place];  Service: General;  Laterality: N/A;   COLONOSCOPY W/ BIOPSIES AND POLYPECTOMY     COLONOSCOPY WITH PROPOFOL N/A 08/03/2020   Procedure: COLONOSCOPY WITH PROPOFOL;  Surgeon: Otis Brace, MD;  [facility];  Service: Gastroenterology;  Laterality: N/A;  ERCP N/A 11/25/2018   Procedure: ENDOSCOPIC RETROGRADE CHOLANGIOPANCREATOGRAPHY (ERCP);  Surgeon: [name], MD;  Location: [place];  Service: Gastroenterology;  Laterality: N/A;   ESOPHAGOGASTRODUODENOSCOPY N/A 12/20/2018   Procedure: ESOPHAGOGASTRODUODENOSCOPY (EGD);  Surgeon: [name], MD;  Location: [facility];  Service: Gastroenterology;  Laterality: N/A;   ESOPHAGOGASTRODUODENOSCOPY (EGD) WITH PROPOFOL N/A 08/03/2020   Procedure: ESOPHAGOGASTRODUODENOSCOPY (EGD)  WITH PROPOFOL;  Surgeon: Otis Brace, MD;  [facility];  Service: Gastroenterology;  Laterality: N/A;   EYE SURGERY     cataract B/L   HAND SURGERY Right    IR ANGIOGRAM SELECTIVE EACH ADDITIONAL VESSEL  05/30/2022   IR ANGIOGRAM SELECTIVE EACH ADDITIONAL VESSEL  05/30/2022   IR ANGIOGRAM SELECTIVE EACH ADDITIONAL VESSEL  05/30/2022   IR ANGIOGRAM SELECTIVE EACH ADDITIONAL VESSEL  05/30/2022   IR ANGIOGRAM SELECTIVE EACH ADDITIONAL VESSEL  05/30/2022   IR ANGIOGRAM SELECTIVE EACH ADDITIONAL VESSEL  05/30/2022   IR ANGIOGRAM VISCERAL SELECTIVE  05/30/2022   IR DIALY SHUNT INTRO NEEDLE/INTRACATH INITIAL W/IMG LEFT Left 08/20/2018   IR EMBO ART  VEN HEMORR LYMPH EXTRAV  INC GUIDE ROADMAPPING  05/30/2022   IR PERC CHOLECYSTOSTOMY  08/19/2018   IR RADIOLOGIST EVAL & MGMT  09/29/2018   IR US GUIDE VASC ACCESS RIGHT  05/30/2022   LOWER EXTREMITY ANGIOGRAPHY Left 01/15/2021   Procedure: Lower Extremity Angiography;  Surgeon: Serafina Mitchell, MD;  Location: [place] [facility];  Service: Cardiovascular;  Laterality: Left;   MULTIPLE TOOTH EXTRACTIONS     PERIPHERAL VASCULAR BALLOON ANGIOPLASTY Right 12/11/2020   Procedure: PERIPHERAL VASCULAR BALLOON ANGIOPLASTY;  Surgeon: Serafina Mitchell, MD;  Location: [place] [facility];  Service: Cardiovascular;  Laterality: Right;   PERIPHERAL VASCULAR BALLOON ANGIOPLASTY Left 01/15/2021   Procedure: PERIPHERAL VASCULAR BALLOON ANGIOPLASTY;  Surgeon: Serafina Mitchell, MD;  Location: [place] [facility];  Service: Cardiovascular;  Laterality: Left;  PT   POLYPECTOMY  08/03/2020   Procedure: POLYPECTOMY;  Surgeon: Otis Brace, MD;  [facility];  Service: Gastroenterology;;   RESECTION OF ARTERIOVENOUS FISTULA ANEURYSM Left 10/27/2019   Procedure: Resection Of Left Upper Arm  Arteriovenous Fistula Aneurysm Times Two;  Surgeon: Serafina Mitchell, MD;  Location: [place];  Service: Vascular;  Laterality: Left;   SHOULDER SURGERY Right    SPHINCTEROTOMY   11/25/2018   Procedure: SPHINCTEROTOMY;  Surgeon: [name], MD;  [facility];  Service: Gastroenterology;;   Lavell Islam REMOVAL  12/20/2018   Procedure: STENT REMOVAL;  Surgeon: [name], MD;  [facility];  Service: Gastroenterology;;   TIBIA FRACTURE SURGERY      Short Social History:  Social History   Tobacco Use   Smoking status: Never   Smokeless tobacco: Former    Types: Chew  Substance Use Topics   Alcohol use: Not Currently    Allergies  Allergen Reactions   Avelox [Moxifloxacin Hcl] Other (See Comments)    Hallucinations    Gabapentin Other (See Comments)    Caused staggering and red feet   Metoprolol Other (See Comments)    Dropped pulse too low; was taken off of this by MD   Quinolones Other (See Comments)    unknown   Shellfish Allergy Nausea And Vomiting and Other (See Comments)    Made patient VERY nauseous and he developed stomach cramps   Sulfa Antibiotics Other (See Comments)    Allergy is from childhood (reaction not recalled)   Tetracyclines & Related Other (See Comments)    Blisters on hands and  arms   Adhesive [Tape] Itching and Rash    Paper tape only- skin cannot tolerate "heavy" tapes    Current Outpatient Medications  Medication Sig Dispense Refill   aspirin EC 81 MG tablet Take 81 mg by mouth at bedtime.      atorvastatin (LIPITOR) 10 MG tablet Take 10 mg by mouth at bedtime.     buPROPion (WELLBUTRIN XL) 150 MG 24 hr tablet Take 150 mg by mouth every evening.     cephALEXin (KEFLEX) 500 MG capsule Take 1 capsule (500 mg total) by mouth 3 (three) times daily. 21 capsule 0   docusate sodium (COLACE) 100 MG capsule Take 100 mg by mouth 2 (two) times daily.      escitalopram (LEXAPRO) 10 MG tablet Take 10 mg by mouth at bedtime.      fluticasone (FLONASE) 50 MCG/ACT nasal spray Place 1 spray into both nostrils daily as needed for allergies or rhinitis.     lactulose, encephalopathy, (CHRONULAC) 10 GM/15ML SOLN Take 10 g by  mouth every evening.  4   levothyroxine (SYNTHROID, LEVOTHROID) 125 MCG tablet Take 125 mcg by mouth daily before breakfast.     lidocaine-prilocaine (EMLA) cream Apply 1 application topically Every Tuesday,Thursday,and Saturday with dialysis.  4   linaclotide (LINZESS) 290 MCG CAPS capsule Take 290 mcg by mouth every evening.      lisinopril (ZESTRIL) 2.5 MG tablet Take 2.5 mg by mouth daily.     loratadine (CLARITIN) 10 MG tablet Take 10 mg by mouth daily.      montelukast (SINGULAIR) 10 MG tablet Take 10 mg every evening by mouth.     nitroGLYCERIN (NITROSTAT) 0.4 MG SL tablet Place 0.4 mg under the tongue every 5 (five) minutes as needed for chest pain.     oxyCODONE-acetaminophen (PERCOCET) 10-325 MG tablet Take 1 tablet by mouth every 4 (four) hours as needed for pain.     PROAIR HFA 108 (90 Base) MCG/ACT inhaler Inhale 2 puffs into the lungs every 6 (six) hours as needed for shortness of breath.     Propylene Glycol (SYSTANE BALANCE) 0.6 % SOLN Place 1 drop into both eyes daily as needed (dry eyes).     sevelamer carbonate (RENVELA) 800 MG tablet Take 3 tablets (2,400 mg total) by mouth 3 (three) times daily with meals. (Patient taking differently: Take 2,400 mg by mouth 2 (two) times daily with a meal.) 45 tablet 0   ferrous sulfate 325 (65 FE) MG tablet Take 1 tablet (325 mg total) by mouth every other day. (Patient taking differently: Take 325 mg by mouth daily after supper.) 15 tablet 0   No current facility-administered medications for this visit.    Review of Systems  Constitutional:  Constitutional negative. HENT: HENT negative.  Eyes: Eyes negative.  Respiratory: Respiratory negative.  Cardiovascular: Positive for leg swelling.  GI: Gastrointestinal negative.  Skin: Positive for wound.  Neurological: Neurological negative. Hematologic: Hematologic/lymphatic negative.  Psychiatric: Psychiatric negative.        Objective:  Objective   Vitals:   08/27/22 0915  BP:  118/68  Pulse: 63  Resp: 20  Temp: 97.9 F (36.6 C)  SpO2: 95%  Weight: 205 lb (93 kg)  Height: '5\' 10"'$  (1.778 m)   Body mass index is 29.41 kg/m.  Physical Exam HENT:     Head: Normocephalic.  Eyes:     Pupils: Pupils are equal, round, and reactive to light.  Pulmonary:     Effort: Pulmonary effort is normal.  Abdominal:     General: Abdomen is flat.  Musculoskeletal:     Cervical back: Normal range of motion.     Right lower leg: No edema.     Left lower leg: Edema present.     Comments: Strong left upper extremity thrill, left forearm and hand swelling Extensive dry gangrene  Gangrene of the left toes as well  Neurological:     Mental Status: He is alert.     Data: No studies today     Assessment/Plan:    81 year old male with worsening gangrenous changes bilateral lower extremities on dialysis Tuesdays, Thursdays and Saturdays via left arm fistula.  I recommended above-knee amputation right he may ultimately need 1 on the left as well.  He is hoping to have this done on a Friday in the near future and I will look at the left lower extremity wounds at that time to see if he needs bilateral above-knee amputation.  He will certainly warrant medical admission will need dialysis and likely is going to need rehab versus probable SNF on discharge.  Patient and his wife demonstrate good understanding should he have fevers or worsening issues before the scheduled date of surgery I have recommended presenting to the emergency department at [facility] where we can schedule surgery sooner.  They demonstrate good understanding.     Waynetta Sandy MD [facility]

## 2022-09-05 NOTE — Pre-Procedure Instructions (Signed)
Surgical Instructions    Your procedure is scheduled on Friday, October 27th.  Report to [facility] Main Entrance "A" at 7:45 A.M., then check in with the Admitting office.  Call this number if you have problems the morning of surgery:  [phone]   If you have any questions prior to your surgery date call [phone]: Open Monday-Friday 8am-4pm    Remember:  Do not eat or drink after midnight the night before your surgery    Take these medicines the morning of surgery with A SIP OF WATER  carvedilol (COREG)  levothyroxine (SYNTHROID, LEVOTHROID)  loratadine (CLARITIN)  montelukast (SINGULAIR)    Take these medications as needed: fluticasone (FLONASE)  oxyCODONE-acetaminophen (PERCOCET)  PROAIR inhaler-Please bring all inhalers with you the day of surgery.  Eye drops  Follow your surgeon's instructions on when to stop Aspirin.  If no instructions were given by your surgeon then you will need to call the office to get those instructions.    As of today, STOP taking any Aleve, Naproxen, Ibuprofen, Motrin, Advil, Goody's, BC's, all herbal medications, fish oil, and all vitamins.                     Do NOT Smoke (Tobacco/Vaping) for 24 hours prior to your procedure.  If you use a CPAP at night, you may bring your mask/headgear for your overnight stay.   Contacts, glasses, piercing's, hearing aid's, dentures or partials may not be worn into surgery, please bring cases for these belongings.    For patients admitted to the hospital, discharge time will be determined by your treatment team.   Patients discharged the day of surgery will not be allowed to drive home, and someone needs to stay with them for 24 hours.  SURGICAL WAITING ROOM VISITATION Patients having surgery or a procedure may have no more than 2 support people in the waiting area - these visitors may rotate.   Children under the age of 71 must have an adult with them who is not the patient. If the patient needs to  stay at the hospital during part of their recovery, the visitor guidelines for inpatient rooms apply. Pre-op nurse will coordinate an appropriate time for 1 support person to accompany patient in pre-op.  This support person may not rotate.   Please refer to the [facility] website for the visitor guidelines for Inpatients (after your surgery is over and you are in a regular room).    Special instructions:   [facility]- Preparing For Surgery  Before surgery, you can play an important role. Because skin is not sterile, your skin needs to be as free of germs as possible. You can reduce the number of germs on your skin by washing with CHG (chlorahexidine gluconate) Soap before surgery.  CHG is an antiseptic cleaner which kills germs and bonds with the skin to continue killing germs even after washing.    Oral Hygiene is also important to reduce your risk of infection.  Remember - BRUSH YOUR TEETH THE MORNING OF SURGERY WITH YOUR REGULAR TOOTHPASTE  Please do not use if you have an allergy to CHG or antibacterial soaps. If your skin becomes reddened/irritated stop using the CHG.  Do not shave (including legs and underarms) for at least 48 hours prior to first CHG shower. It is OK to shave your face.  Please follow these instructions carefully.   Shower the NIGHT BEFORE SURGERY and the MORNING OF SURGERY  If you chose to wash your  hair, wash your hair first as usual with your normal shampoo.  After you shampoo, rinse your hair and body thoroughly to remove the shampoo.  Use CHG Soap as you would any other liquid soap. You can apply CHG directly to the skin and wash gently with a scrungie or a clean washcloth.   Apply the CHG Soap to your body ONLY FROM THE NECK DOWN.  Do not use on open wounds or open sores. Avoid contact with your eyes, ears, mouth and genitals (private parts). Wash Face and genitals (private parts)  with your normal soap.   Wash thoroughly, paying special attention to the  area where your surgery will be performed.  Thoroughly rinse your body with warm water from the neck down.  DO NOT shower/wash with your normal soap after using and rinsing off the CHG Soap.  Pat yourself dry with a CLEAN TOWEL.  Wear CLEAN PAJAMAS to bed the night before surgery  Place CLEAN SHEETS on your bed the night before your surgery  DO NOT SLEEP WITH PETS.   Day of Surgery: Take a shower with CHG soap. Do not wear jewelry. Do not wear lotions, powders, colognes, or deodorant. Men may shave face and neck. Do not bring valuables to the hospital.  [facility] is not responsible for any belongings or valuables. Wear Clean/Comfortable clothing the morning of surgery Remember to brush your teeth WITH YOUR REGULAR TOOTHPASTE.   Please read over the following fact sheets that you were given.    If you received a COVID test during your pre-op visit  it is requested that you wear a mask when out in public, stay away from anyone that may not be feeling well and notify your surgeon if you develop symptoms. If you have been in contact with anyone that has tested positive in the last 10 days please notify you surgeon.

## 2022-09-08 ENCOUNTER — Other Ambulatory Visit: Payer: Self-pay

## 2022-09-08 ENCOUNTER — Encounter (HOSPITAL_COMMUNITY): Payer: Self-pay

## 2022-09-08 ENCOUNTER — Encounter (HOSPITAL_COMMUNITY)
Admission: RE | Admit: 2022-09-08 | Discharge: 2022-09-08 | Disposition: A | Discharge status: Home / Self Care | Payer: Medicare Other | Source: Ambulatory Visit | Attending: Vascular Surgery | Admitting: Vascular Surgery

## 2022-09-08 VITALS — BP 106/57 | HR 80 | Temp 98.2°F | Resp 18 | Ht 70.0 in | Wt 205.0 lb

## 2022-09-08 DIAGNOSIS — Z01818 Encounter for other preprocedural examination: Secondary | ICD-10-CM | POA: Insufficient documentation

## 2022-09-08 DIAGNOSIS — I739 Peripheral vascular disease, unspecified: Secondary | ICD-10-CM | POA: Insufficient documentation

## 2022-09-08 HISTORY — DX: Cardiac arrhythmia, unspecified: I49.9

## 2022-09-08 HISTORY — DX: Pneumonia, unspecified organism: J18.9

## 2022-09-08 HISTORY — DX: Peripheral vascular disease, unspecified: I73.9

## 2022-09-08 LAB — CBC
HCT: 34.1 % — ABNORMAL LOW (ref 39.0–52.0)
Hemoglobin: 10.6 g/dL — ABNORMAL LOW (ref 13.0–17.0)
MCH: 27.5 pg (ref 26.0–34.0)
MCHC: 31.1 g/dL (ref 30.0–36.0)
MCV: 88.3 fL (ref 80.0–100.0)
Platelets: 375 10*3/uL (ref 150–400)
RBC: 3.86 MIL/uL — ABNORMAL LOW (ref 4.22–5.81)
RDW: 17.1 % — ABNORMAL HIGH (ref 11.5–15.5)
WBC: 15.8 10*3/uL — ABNORMAL HIGH (ref 4.0–10.5)
nRBC: 0 % (ref 0.0–0.2)

## 2022-09-08 LAB — COMPREHENSIVE METABOLIC PANEL
ALT: 6 U/L (ref 0–44)
AST: 9 U/L — ABNORMAL LOW (ref 15–41)
Albumin: 2.2 g/dL — ABNORMAL LOW (ref 3.5–5.0)
Alkaline Phosphatase: 162 U/L — ABNORMAL HIGH (ref 38–126)
Anion gap: 11 (ref 5–15)
BUN: 52 mg/dL — ABNORMAL HIGH (ref 8–23)
CO2: 28 mmol/L (ref 22–32)
Calcium: 9.6 mg/dL (ref 8.9–10.3)
Chloride: 96 mmol/L — ABNORMAL LOW (ref 98–111)
Creatinine, Ser: 7.34 mg/dL — ABNORMAL HIGH (ref 0.61–1.24)
GFR, Estimated: 7 mL/min — ABNORMAL LOW (ref >60)
Glucose, Bld: 115 mg/dL — ABNORMAL HIGH (ref 70–99)
Potassium: 5.6 mmol/L — ABNORMAL HIGH (ref 3.5–5.1)
Sodium: 135 mmol/L (ref 135–145)
Total Bilirubin: 0.6 mg/dL (ref 0.3–1.2)
Total Protein: 5.3 g/dL — ABNORMAL LOW (ref 6.5–8.1)

## 2022-09-08 LAB — HEMOGLOBIN A1C
Hgb A1c MFr Bld: 6 % — ABNORMAL HIGH (ref 4.8–5.6)
Mean Plasma Glucose: 125.5 mg/dL

## 2022-09-08 LAB — PROTIME-INR
INR: 1.2 (ref 0.8–1.2)
Prothrombin Time: 15.5 seconds — ABNORMAL HIGH (ref 11.4–15.2)

## 2022-09-08 LAB — APTT: aPTT: 49 seconds — ABNORMAL HIGH (ref 24–36)

## 2022-09-08 LAB — GLUCOSE, CAPILLARY: Glucose-Capillary: 80 mg/dL (ref 70–99)

## 2022-09-08 NOTE — Progress Notes (Signed)
PCP - Dr.Thomas Selena Batten Cardiologist - Meeredith Mitchell,NP  PPM/ICD - denies Device Orders - n/a Rep Notified - n/a  Chest x-ray - 07/10/22 EKG - 07/10/22 Stress Test - 2017 ECHO - 05/14/22 Cardiac Cath - 06/11/22  Sleep Study - yes CPAP - denies using CPAP  Fasting Blood Sugar - 120-130 Checks Blood Sugar ___2-3__ times a day  Last dose of GLP1 agonist-  denies GLP1 instructions: denies  Blood Thinner Instructions:denies Aspirin Instructions:Follow your surgeon's instructions on when to stop Aspirin.  If no instructions were given by your surgeon then you will need to call the office to get those instructions.     NPO after midnight COVID TEST- n/a   Anesthesia review: YES, see cardiologist OV 06/27/22. Cardiac history, ESRD.   Patient denies shortness of breath, fever, cough and chest pain at PAT appointment   All instructions explained to the patient, with a verbal understanding of the material. Patient agrees to go over the instructions while at home for a better understanding. Patient also instructed to self quarantine after being tested for COVID-19. The opportunity to ask questions was provided.

## 2022-09-09 ENCOUNTER — Other Ambulatory Visit: Payer: Self-pay

## 2022-09-09 ENCOUNTER — Emergency Department (HOSPITAL_COMMUNITY)
Admission: EM | Admit: 2022-09-09 | Discharge: 2022-09-09 | Discharge status: Against Medical Advice | Payer: Medicare Other | Attending: Emergency Medicine | Admitting: Emergency Medicine

## 2022-09-09 ENCOUNTER — Telehealth: Payer: Self-pay

## 2022-09-09 ENCOUNTER — Encounter (HOSPITAL_COMMUNITY): Payer: Self-pay

## 2022-09-09 ENCOUNTER — Emergency Department (HOSPITAL_COMMUNITY): Payer: Medicare Other

## 2022-09-09 DIAGNOSIS — R031 Nonspecific low blood-pressure reading: Secondary | ICD-10-CM | POA: Insufficient documentation

## 2022-09-09 DIAGNOSIS — M25572 Pain in left ankle and joints of left foot: Secondary | ICD-10-CM | POA: Insufficient documentation

## 2022-09-09 DIAGNOSIS — Z5321 Procedure and treatment not carried out due to patient leaving prior to being seen by health care provider: Secondary | ICD-10-CM | POA: Insufficient documentation

## 2022-09-09 DIAGNOSIS — M25571 Pain in right ankle and joints of right foot: Secondary | ICD-10-CM | POA: Diagnosis present

## 2022-09-09 LAB — CBC WITH DIFFERENTIAL/PLATELET
Abs Immature Granulocytes: 0.14 10*3/uL — ABNORMAL HIGH (ref 0.00–0.07)
Basophils Absolute: 0.1 10*3/uL (ref 0.0–0.1)
Basophils Relative: 0 %
Eosinophils Absolute: 0.1 10*3/uL (ref 0.0–0.5)
Eosinophils Relative: 0 %
HCT: 38.9 % — ABNORMAL LOW (ref 39.0–52.0)
Hemoglobin: 11.8 g/dL — ABNORMAL LOW (ref 13.0–17.0)
Immature Granulocytes: 1 %
Lymphocytes Relative: 4 %
Lymphs Abs: 0.7 10*3/uL (ref 0.7–4.0)
MCH: 27.2 pg (ref 26.0–34.0)
MCHC: 30.3 g/dL (ref 30.0–36.0)
MCV: 89.6 fL (ref 80.0–100.0)
Monocytes Absolute: 1.1 10*3/uL — ABNORMAL HIGH (ref 0.1–1.0)
Monocytes Relative: 6 %
Neutro Abs: 17.1 10*3/uL — ABNORMAL HIGH (ref 1.7–7.7)
Neutrophils Relative %: 89 %
Platelets: 411 10*3/uL — ABNORMAL HIGH (ref 150–400)
RBC: 4.34 MIL/uL (ref 4.22–5.81)
RDW: 17.1 % — ABNORMAL HIGH (ref 11.5–15.5)
WBC: 19.1 10*3/uL — ABNORMAL HIGH (ref 4.0–10.5)
nRBC: 0 % (ref 0.0–0.2)

## 2022-09-09 LAB — COMPREHENSIVE METABOLIC PANEL
ALT: 8 U/L (ref 0–44)
AST: 8 U/L — ABNORMAL LOW (ref 15–41)
Albumin: 2.5 g/dL — ABNORMAL LOW (ref 3.5–5.0)
Alkaline Phosphatase: 190 U/L — ABNORMAL HIGH (ref 38–126)
Anion gap: 16 — ABNORMAL HIGH (ref 5–15)
BUN: 64 mg/dL — ABNORMAL HIGH (ref 8–23)
CO2: 25 mmol/L (ref 22–32)
Calcium: 10 mg/dL (ref 8.9–10.3)
Chloride: 93 mmol/L — ABNORMAL LOW (ref 98–111)
Creatinine, Ser: 8.26 mg/dL — ABNORMAL HIGH (ref 0.61–1.24)
GFR, Estimated: 6 mL/min — ABNORMAL LOW (ref >60)
Glucose, Bld: 117 mg/dL — ABNORMAL HIGH (ref 70–99)
Potassium: 5.9 mmol/L — ABNORMAL HIGH (ref 3.5–5.1)
Sodium: 134 mmol/L — ABNORMAL LOW (ref 135–145)
Total Bilirubin: 0.5 mg/dL (ref 0.3–1.2)
Total Protein: 6 g/dL — ABNORMAL LOW (ref 6.5–8.1)

## 2022-09-09 LAB — PROTIME-INR
INR: 1.2 (ref 0.8–1.2)
Prothrombin Time: 15.5 seconds — ABNORMAL HIGH (ref 11.4–15.2)

## 2022-09-09 LAB — LACTIC ACID, PLASMA: Lactic Acid, Venous: 1.2 mmol/L (ref 0.5–1.9)

## 2022-09-09 NOTE — ED Notes (Signed)
Pt would like to leave. Encouraged to stay for lab work. This RN places IV and obtains lab work. Pt agrees to stay in the meantime.

## 2022-09-09 NOTE — Progress Notes (Signed)
Anesthesia Chart Review:  Follows cardiology for history of HTN, HLD, left bundle branch block, OSA not on CPAP, nonischemic cardiomyopathy with EF of 20-25% and moderate to severe mitral regurgitation.  Most recent echo 04/2022 showed severe global hypokinesis with akinetic septum and inferior walls, RV septum hypokinetic to akinetic, moderate to severe mitral regurgitation. Patient was then sent for cardiac cath that showed moderate, nonobstructive coronary artery disease with diffuse coronary calcifications, mildly elevated left ventricular end-diastolic pressure at 18 mmHg, mild pulmonary hypertension, normal cardiac output patient was last seen by Clydie Braun, NP on 06/27/2022.  At that time his carvedilol was reduced to once daily due to hypotension and fatigue.  He was recommended to have follow-up echocardiogram in 6 to 9 months after initiating GDMT for heart failure.  Preop clearance dated 09/08/2022 states, "Patient is moderate risk, he has HFrEF with EF of 20-25% as well as end stage CKD and nonobstructive CAD by catheterization done 06/11/2022."  Patient has OSA and cannot tolerate CPAP mask.  He was recently started on nocturnal O2 1.5 L/min.  ESRD on HD Tuesday Thursday Saturday.  Patient was unable to dialyze on 06/09/2022 secondary to severe bilateral lower extremity pain.  He was transferred to ED for further evaluation.   Cath 06/11/2022 (care everywhere): DIAGNOSTIC FINDINGS:    1.  Moderate, nonobstructive coronary artery disease with diffuse coronary  calcifications.  2.  Mildly elevated left ventricular end-diastolic pressure, 18 mmHg.  3.  Mild pulmonary hypertension, 42/21.  4.  Normal cardiac output, 6 L/min, cardiac index 2.8.   TTE 05/14/2022 (care everywhere): SUMMARY  The left ventricular size is normal.  There is normal left ventricular wall thickness.  false tendons noted in the LV Apex  LV ejection fraction = 20-25%.  Significant change since prior echo in 2019   severe global hypokinesis with akinetic septum and inferior walls  RV septum hypokinetic to akinetic  There is moderate to severe mitral regurgitation.  MR is new since prior echo in 2019  There is mild tricuspid regurgitation.

## 2022-09-09 NOTE — Telephone Encounter (Addendum)
Pt's wife, Christian Dalton, called stating that the pt is having significant pain and was requesting an appt with Dr. Donzetta Matters.  Reviewed pt's chart, returned call for clarification, two identifiers used. Pt's wife states that he is having severe pain and can barely walk. Pt is currently scheduled for an AKA on 10/27. Informed her that according to the last office visit, if he had worsening symptoms, he should go to the ED so the surgery could be pushed sooner. It was essentially a waste of time for him to come into the office. Pt went to dialysis and did not want to wait in ED. Instructed her to call 911 since he was having so much difficulty ambulating and they could do HD treatment at the hospital. Informed her that Dr Donzetta Matters was at the hospital today and I could page him so he would be aware. Stated that she would try to convince him to go.   Pt's wife called back stating pt is willing to go to ED, but not via ambulance. Dr. Donzetta Matters paged to alert him.

## 2022-09-09 NOTE — ED Provider Triage Note (Signed)
Emergency Medicine Provider Triage Evaluation Note  Christian Dalton , a 81 y.o. male  was evaluated in triage.  Pt complains of bilateral foot pain and low blood pressure.  Is supposed to have an amputation of his right foot performed by Dr. Donzetta Matters later this month.  Patient was at dialysis today and was complaining that the pain in his feet was worse.  They were getting vital signs before starting dialysis noted him to have a blood pressure in the 80s and sent him to the ER for evaluation.  Last full dialysis on Saturday, did not receive treatment today.  Does not require oxygen chronically, only intermittently at dialysis.  Review of Systems  Positive: Foot pain Negative:   Physical Exam  BP (!) 81/45 (BP Location: Right Arm)   Pulse (!) 126   Temp 98 F (36.7 C) (Oral)   Resp 17   SpO2 (!) 73%  Gen:   Awake, no distress   Resp:  Normal effort  MSK:   Moves extremities without difficulty  Other:    Medical Decision Making  Medically screening exam initiated at 1:46 PM.  Appropriate orders placed.  Zedric Gudiel was informed that the remainder of the evaluation will be completed by another provider, this initial triage assessment does not replace that evaluation, and the importance of remaining in the ED until their evaluation is complete.  Workup initiated   Kateri Plummer, PA-C 09/09/22 1717

## 2022-09-09 NOTE — ED Notes (Signed)
Pt requests IV removal. Pt and family encouraged to stay. Pt informed of risks of leaving and continues to ask to leave. IV removed and seen leaving lobby with family.

## 2022-09-09 NOTE — ED Triage Notes (Signed)
Pt referred from dialysis for wound infection pain. Pt has wounds to bilateral feet, with plan for amputation soon. Pt states pain has been increasing. Unable to have dialysis today, last tx Saturday. Pt initially hypotensive and tachycardic when checked in. During triage pt vitals are more stable. PA to room to assess pt.

## 2022-09-11 NOTE — Anesthesia Preprocedure Evaluation (Addendum)
Anesthesia Evaluation  Patient identified by MRN, date of birth, ID band Patient awake    Reviewed: Allergy & Precautions, NPO status , Patient's Chart, lab work & pertinent test results  Airway Mallampati: II  TM Distance: >3 FB Neck ROM: Full    Dental  (+) Upper Dentures, Lower Dentures   Pulmonary sleep apnea , COPD (1.5L QHS),  COPD inhaler and oxygen dependent,    breath sounds clear to auscultation       Cardiovascular hypertension, Pt. on medications and Pt. on home beta blockers + CAD, + Peripheral Vascular Disease and +CHF  + dysrhythmias Atrial Fibrillation  Rhythm:Regular Rate:Normal  Cath 06/11/2022 (care everywhere): DIAGNOSTIC FINDINGS:   1. Moderate, nonobstructive coronary artery disease with diffuse coronary  calcifications.  2. Mildly elevated left ventricular end-diastolic pressure, 18 mmHg.  3. Mild pulmonary hypertension, 42/21.  4. Normal cardiac output, 6 L/min, cardiac index 2.8.   TTE 05/14/2022 (care everywhere): SUMMARY  The left ventricular size is normal.  There is normal left ventricular wall thickness.  false tendons noted in the LV Apex  LV ejection fraction = 20-25%.  Significant change since prior echo in 2019  severe global hypokinesis with akinetic septum and inferior walls  RV septum hypokinetic to akinetic  There is moderate to severe mitral regurgitation.  MR is new since prior echo in 2019  There is mild tricuspid regurgitation.    Neuro/Psych PSYCHIATRIC DISORDERS Depression negative neurological ROS     GI/Hepatic negative GI ROS, Neg liver ROS,   Endo/Other  diabetesHypothyroidism   Renal/GU ESRF and DialysisRenal disease  negative genitourinary   Musculoskeletal  (+) Arthritis ,   Abdominal Normal abdominal exam  (+)   Peds  Hematology  (+) Blood dyscrasia, anemia ,   Anesthesia Other Findings   Reproductive/Obstetrics                            Anesthesia Physical Anesthesia Plan  ASA: 4  Anesthesia Plan: General   Post-op Pain Management:    Induction: Intravenous  PONV Risk Score and Plan: 2 and Ondansetron and Dexamethasone  Airway Management Planned: Mask and Oral ETT  Additional Equipment: Arterial line  Intra-op Plan:   Post-operative Plan: Extubation in OR  Informed Consent: I have reviewed the patients History and Physical, chart, labs and discussed the procedure including the risks, benefits and alternatives for the proposed anesthesia with the patient or authorized representative who has indicated his/her understanding and acceptance.     Dental advisory given  Plan Discussed with:   Anesthesia Plan Comments: (PAT note by Karoline Caldwell, PA-C: [facility] for history of HTN, HLD, left bundle branch block, OSA not on CPAP, nonischemic cardiomyopathy with EF of 20-25% and moderate to severe mitral regurgitation.  Most recent echo 04/2022 showed severe global hypokinesis with akinetic septum and inferior walls, RV septum hypokinetic to akinetic, moderate to severe mitral regurgitation. Patient was then sent for cardiac cath that showed moderate, nonobstructive coronary artery disease with diffuse coronary calcifications, mildly elevated left ventricular end-diastolic pressure at 18 mmHg, mild pulmonary hypertension, normal cardiac output patient was last seen by Clydie Braun, NP on 06/27/2022.  At that time his carvedilol was reduced to once daily due to hypotension and fatigue.  He was recommended to have follow-up echocardiogram in 6 to 9 months after initiating GDMT for heart failure.  Preop clearance dated 09/08/2022 states, "Patient is moderate risk, he has HFrEF with EF of 20-25%  as well as end stage CKD and nonobstructive CAD by catheterization done 06/11/2022."  Patient has OSA and cannot tolerate CPAP mask.  He was recently started on nocturnal O2 1.5 L/min.  ESRD on HD Tuesday Thursday  Saturday.  Patient was unable to dialyze on 06/09/2022 secondary to severe bilateral lower extremity pain.  He was transferred to ED for further evaluation.  He did ultimately leave the ED before completing evaluation.  I did relay this information to Dr. Claretha Cooper office.  Non-insulin-dependent DM2, A1c 6.0 on 06/08/2022.  Preop labs reviewed, consistent with ESRD.  Cath 06/11/2022 (care everywhere): DIAGNOSTIC FINDINGS:   1. Moderate, nonobstructive coronary artery disease with diffuse coronary  calcifications.  2. Mildly elevated left ventricular end-diastolic pressure, 18 mmHg.  3. Mild pulmonary hypertension, 42/21.  4. Normal cardiac output, 6 L/min, cardiac index 2.8.   TTE 05/14/2022 (care everywhere): SUMMARY  The left ventricular size is normal.  There is normal left ventricular wall thickness.  false tendons noted in the LV Apex  LV ejection fraction = 20-25%.  Significant change since prior echo in 2019  severe global hypokinesis with akinetic septum and inferior walls  RV septum hypokinetic to akinetic  There is moderate to severe mitral regurgitation.  MR is new since prior echo in 2019  There is mild tricuspid regurgitation.  )      Anesthesia Quick Evaluation

## 2022-09-14 LAB — CULTURE, BLOOD (ROUTINE X 2)
Culture: NO GROWTH
Culture: NO GROWTH
Special Requests: ADEQUATE
Special Requests: ADEQUATE

## 2022-09-15 ENCOUNTER — Telehealth: Payer: Self-pay

## 2022-09-15 ENCOUNTER — Emergency Department (HOSPITAL_COMMUNITY): Payer: Medicare Other

## 2022-09-15 ENCOUNTER — Inpatient Hospital Stay (HOSPITAL_COMMUNITY)
Admission: EM | Admit: 2022-09-15 | Discharge: 2022-09-29 | DRG: 239 | Disposition: A | Discharge status: Skilled Nursing Facility | Payer: Medicare Other | Attending: Internal Medicine | Admitting: Internal Medicine

## 2022-09-15 ENCOUNTER — Encounter (HOSPITAL_COMMUNITY): Payer: Self-pay

## 2022-09-15 DIAGNOSIS — Z794 Long term (current) use of insulin: Secondary | ICD-10-CM | POA: Diagnosis not present

## 2022-09-15 DIAGNOSIS — I2510 Atherosclerotic heart disease of native coronary artery without angina pectoris: Secondary | ICD-10-CM | POA: Diagnosis present

## 2022-09-15 DIAGNOSIS — F05 Delirium due to known physiological condition: Secondary | ICD-10-CM | POA: Diagnosis not present

## 2022-09-15 DIAGNOSIS — N186 End stage renal disease: Secondary | ICD-10-CM | POA: Diagnosis present

## 2022-09-15 DIAGNOSIS — E11649 Type 2 diabetes mellitus with hypoglycemia without coma: Secondary | ICD-10-CM | POA: Diagnosis not present

## 2022-09-15 DIAGNOSIS — Z8249 Family history of ischemic heart disease and other diseases of the circulatory system: Secondary | ICD-10-CM

## 2022-09-15 DIAGNOSIS — E039 Hypothyroidism, unspecified: Secondary | ICD-10-CM | POA: Diagnosis present

## 2022-09-15 DIAGNOSIS — Z992 Dependence on renal dialysis: Secondary | ICD-10-CM

## 2022-09-15 DIAGNOSIS — I96 Gangrene, not elsewhere classified: Secondary | ICD-10-CM

## 2022-09-15 DIAGNOSIS — Z881 Allergy status to other antibiotic agents status: Secondary | ICD-10-CM

## 2022-09-15 DIAGNOSIS — E785 Hyperlipidemia, unspecified: Secondary | ICD-10-CM | POA: Diagnosis present

## 2022-09-15 DIAGNOSIS — I5020 Unspecified systolic (congestive) heart failure: Secondary | ICD-10-CM | POA: Diagnosis not present

## 2022-09-15 DIAGNOSIS — J449 Chronic obstructive pulmonary disease, unspecified: Secondary | ICD-10-CM | POA: Diagnosis present

## 2022-09-15 DIAGNOSIS — E11628 Type 2 diabetes mellitus with other skin complications: Secondary | ICD-10-CM

## 2022-09-15 DIAGNOSIS — Z974 Presence of external hearing-aid: Secondary | ICD-10-CM

## 2022-09-15 DIAGNOSIS — E1152 Type 2 diabetes mellitus with diabetic peripheral angiopathy with gangrene: Secondary | ICD-10-CM | POA: Diagnosis present

## 2022-09-15 DIAGNOSIS — Z79899 Other long term (current) drug therapy: Secondary | ICD-10-CM

## 2022-09-15 DIAGNOSIS — Z882 Allergy status to sulfonamides status: Secondary | ICD-10-CM

## 2022-09-15 DIAGNOSIS — G4733 Obstructive sleep apnea (adult) (pediatric): Secondary | ICD-10-CM | POA: Diagnosis present

## 2022-09-15 DIAGNOSIS — Z8719 Personal history of other diseases of the digestive system: Secondary | ICD-10-CM

## 2022-09-15 DIAGNOSIS — D62 Acute posthemorrhagic anemia: Secondary | ICD-10-CM | POA: Diagnosis not present

## 2022-09-15 DIAGNOSIS — N2581 Secondary hyperparathyroidism of renal origin: Secondary | ICD-10-CM | POA: Diagnosis present

## 2022-09-15 DIAGNOSIS — F32A Depression, unspecified: Secondary | ICD-10-CM | POA: Diagnosis present

## 2022-09-15 DIAGNOSIS — I132 Hypertensive heart and chronic kidney disease with heart failure and with stage 5 chronic kidney disease, or end stage renal disease: Secondary | ICD-10-CM | POA: Diagnosis present

## 2022-09-15 DIAGNOSIS — Z87891 Personal history of nicotine dependence: Secondary | ICD-10-CM

## 2022-09-15 DIAGNOSIS — E1151 Type 2 diabetes mellitus with diabetic peripheral angiopathy without gangrene: Secondary | ICD-10-CM | POA: Diagnosis not present

## 2022-09-15 DIAGNOSIS — Z9049 Acquired absence of other specified parts of digestive tract: Secondary | ICD-10-CM

## 2022-09-15 DIAGNOSIS — I493 Ventricular premature depolarization: Secondary | ICD-10-CM | POA: Diagnosis not present

## 2022-09-15 DIAGNOSIS — E1122 Type 2 diabetes mellitus with diabetic chronic kidney disease: Secondary | ICD-10-CM | POA: Diagnosis present

## 2022-09-15 DIAGNOSIS — D631 Anemia in chronic kidney disease: Secondary | ICD-10-CM | POA: Diagnosis present

## 2022-09-15 DIAGNOSIS — Z91013 Allergy to seafood: Secondary | ICD-10-CM

## 2022-09-15 DIAGNOSIS — R012 Other cardiac sounds: Secondary | ICD-10-CM | POA: Diagnosis not present

## 2022-09-15 DIAGNOSIS — R410 Disorientation, unspecified: Secondary | ICD-10-CM | POA: Diagnosis not present

## 2022-09-15 DIAGNOSIS — Z515 Encounter for palliative care: Secondary | ICD-10-CM | POA: Diagnosis not present

## 2022-09-15 DIAGNOSIS — Q613 Polycystic kidney, unspecified: Secondary | ICD-10-CM | POA: Diagnosis not present

## 2022-09-15 DIAGNOSIS — H9190 Unspecified hearing loss, unspecified ear: Secondary | ICD-10-CM | POA: Diagnosis present

## 2022-09-15 DIAGNOSIS — K5900 Constipation, unspecified: Secondary | ICD-10-CM | POA: Diagnosis present

## 2022-09-15 DIAGNOSIS — E875 Hyperkalemia: Secondary | ICD-10-CM | POA: Diagnosis present

## 2022-09-15 DIAGNOSIS — Z91048 Other nonmedicinal substance allergy status: Secondary | ICD-10-CM

## 2022-09-15 DIAGNOSIS — I509 Heart failure, unspecified: Secondary | ICD-10-CM | POA: Diagnosis not present

## 2022-09-15 DIAGNOSIS — I5022 Chronic systolic (congestive) heart failure: Secondary | ICD-10-CM | POA: Diagnosis present

## 2022-09-15 DIAGNOSIS — Z888 Allergy status to other drugs, medicaments and biological substances status: Secondary | ICD-10-CM

## 2022-09-15 DIAGNOSIS — I10 Essential (primary) hypertension: Secondary | ICD-10-CM | POA: Diagnosis present

## 2022-09-15 DIAGNOSIS — E1129 Type 2 diabetes mellitus with other diabetic kidney complication: Secondary | ICD-10-CM | POA: Diagnosis present

## 2022-09-15 DIAGNOSIS — I739 Peripheral vascular disease, unspecified: Secondary | ICD-10-CM | POA: Diagnosis not present

## 2022-09-15 DIAGNOSIS — L089 Local infection of the skin and subcutaneous tissue, unspecified: Secondary | ICD-10-CM | POA: Diagnosis present

## 2022-09-15 DIAGNOSIS — I4891 Unspecified atrial fibrillation: Secondary | ICD-10-CM | POA: Diagnosis present

## 2022-09-15 DIAGNOSIS — Z01818 Encounter for other preprocedural examination: Secondary | ICD-10-CM

## 2022-09-15 DIAGNOSIS — I953 Hypotension of hemodialysis: Secondary | ICD-10-CM | POA: Diagnosis not present

## 2022-09-15 DIAGNOSIS — M898X9 Other specified disorders of bone, unspecified site: Secondary | ICD-10-CM | POA: Diagnosis present

## 2022-09-15 DIAGNOSIS — Z7982 Long term (current) use of aspirin: Secondary | ICD-10-CM

## 2022-09-15 DIAGNOSIS — Z79890 Hormone replacement therapy (postmenopausal): Secondary | ICD-10-CM

## 2022-09-15 LAB — CBC WITH DIFFERENTIAL/PLATELET
Abs Immature Granulocytes: 0.22 10*3/uL — ABNORMAL HIGH (ref 0.00–0.07)
Basophils Absolute: 0.1 10*3/uL (ref 0.0–0.1)
Basophils Relative: 0 %
Eosinophils Absolute: 0.2 10*3/uL (ref 0.0–0.5)
Eosinophils Relative: 1 %
HCT: 36 % — ABNORMAL LOW (ref 39.0–52.0)
Hemoglobin: 11.2 g/dL — ABNORMAL LOW (ref 13.0–17.0)
Immature Granulocytes: 1 %
Lymphocytes Relative: 4 %
Lymphs Abs: 0.8 10*3/uL (ref 0.7–4.0)
MCH: 27.5 pg (ref 26.0–34.0)
MCHC: 31.1 g/dL (ref 30.0–36.0)
MCV: 88.5 fL (ref 80.0–100.0)
Monocytes Absolute: 1 10*3/uL (ref 0.1–1.0)
Monocytes Relative: 5 %
Neutro Abs: 19.5 10*3/uL — ABNORMAL HIGH (ref 1.7–7.7)
Neutrophils Relative %: 89 %
Platelets: 303 10*3/uL (ref 150–400)
RBC: 4.07 MIL/uL — ABNORMAL LOW (ref 4.22–5.81)
RDW: 17.5 % — ABNORMAL HIGH (ref 11.5–15.5)
WBC: 21.7 10*3/uL — ABNORMAL HIGH (ref 4.0–10.5)
nRBC: 0 % (ref 0.0–0.2)

## 2022-09-15 LAB — COMPREHENSIVE METABOLIC PANEL
ALT: 8 U/L (ref 0–44)
AST: 9 U/L — ABNORMAL LOW (ref 15–41)
Albumin: 2.1 g/dL — ABNORMAL LOW (ref 3.5–5.0)
Alkaline Phosphatase: 231 U/L — ABNORMAL HIGH (ref 38–126)
Anion gap: 17 — ABNORMAL HIGH (ref 5–15)
BUN: 61 mg/dL — ABNORMAL HIGH (ref 8–23)
CO2: 22 mmol/L (ref 22–32)
Calcium: 9.4 mg/dL (ref 8.9–10.3)
Chloride: 95 mmol/L — ABNORMAL LOW (ref 98–111)
Creatinine, Ser: 7.67 mg/dL — ABNORMAL HIGH (ref 0.61–1.24)
GFR, Estimated: 7 mL/min — ABNORMAL LOW (ref >60)
Glucose, Bld: 98 mg/dL (ref 70–99)
Potassium: 6.6 mmol/L (ref 3.5–5.1)
Sodium: 134 mmol/L — ABNORMAL LOW (ref 135–145)
Total Bilirubin: 0.6 mg/dL (ref 0.3–1.2)
Total Protein: 5.4 g/dL — ABNORMAL LOW (ref 6.5–8.1)

## 2022-09-15 LAB — SEDIMENTATION RATE: Sed Rate: 47 mm/hr — ABNORMAL HIGH (ref 0–16)

## 2022-09-15 LAB — LACTIC ACID, PLASMA
Lactic Acid, Venous: 1.7 mmol/L (ref 0.5–1.9)
Lactic Acid, Venous: 1.4 mmol/L (ref 0.5–1.9)

## 2022-09-15 LAB — C-REACTIVE PROTEIN: CRP: 19.7 mg/dL — ABNORMAL HIGH (ref <1.0)

## 2022-09-15 LAB — HEPATITIS B SURFACE ANTIGEN: Hepatitis B Surface Ag: NONREACTIVE

## 2022-09-15 LAB — CBG MONITORING, ED: Glucose-Capillary: 102 mg/dL — ABNORMAL HIGH (ref 70–99)

## 2022-09-15 MED ORDER — ALBUMIN HUMAN 25 % IV SOLN
12.5000 g | INTRAVENOUS | Status: AC | PRN
  Administered 2022-09-15: 12.5 g via INTRAVENOUS
  Filled 2022-09-15: qty 50

## 2022-09-15 MED ORDER — CHLORHEXIDINE GLUCONATE CLOTH 2 % EX PADS: 6.0000 | MEDICATED_PAD | Freq: Every day | CUTANEOUS | Status: DC

## 2022-09-15 MED ORDER — PENTAFLUOROPROP-TETRAFLUOROETH EX AERO: 1.0000 | INHALATION_SPRAY | CUTANEOUS | Status: DC | PRN

## 2022-09-15 MED ORDER — METRONIDAZOLE 500 MG/100ML IV SOLN
500.0000 mg | Freq: Once | INTRAVENOUS | Status: AC
  Administered 2022-09-15: 500 mg via INTRAVENOUS
  Filled 2022-09-15: qty 100

## 2022-09-15 MED ORDER — MIDODRINE HCL 5 MG PO TABS
10.0000 mg | ORAL_TABLET | Freq: Three times a day (TID) | ORAL | Status: DC
  Administered 2022-09-15 – 2022-09-17 (×5): 10 mg via ORAL
  Filled 2022-09-15 (×5): qty 2

## 2022-09-15 MED ORDER — ESCITALOPRAM OXALATE 10 MG PO TABS
10.0000 mg | ORAL_TABLET | Freq: Every day | ORAL | Status: DC
  Administered 2022-09-16 – 2022-09-28 (×14): 10 mg via ORAL
  Filled 2022-09-15 (×14): qty 1

## 2022-09-15 MED ORDER — LACTATED RINGERS IV SOLN: INTRAVENOUS | Status: DC

## 2022-09-15 MED ORDER — ALBUTEROL SULFATE (2.5 MG/3ML) 0.083% IN NEBU
2.5000 mg | INHALATION_SOLUTION | Freq: Four times a day (QID) | RESPIRATORY_TRACT | Status: DC | PRN
  Filled 2022-09-15: qty 3

## 2022-09-15 MED ORDER — LEVOTHYROXINE SODIUM 125 MCG PO TABS: 125.0000 ug | ORAL_TABLET | Freq: Every day | ORAL | Status: DC

## 2022-09-15 MED ORDER — MONTELUKAST SODIUM 10 MG PO TABS
10.0000 mg | ORAL_TABLET | Freq: Every day | ORAL | Status: DC
  Administered 2022-09-16 – 2022-09-28 (×14): 10 mg via ORAL
  Filled 2022-09-15 (×14): qty 1

## 2022-09-15 MED ORDER — SEVELAMER CARBONATE 800 MG PO TABS
800.0000 mg | ORAL_TABLET | Freq: Two times a day (BID) | ORAL | Status: DC
  Administered 2022-09-16 – 2022-09-17 (×3): 800 mg via ORAL
  Filled 2022-09-15 (×3): qty 1

## 2022-09-15 MED ORDER — SODIUM BICARBONATE 8.4 % IV SOLN
50.0000 meq | Freq: Once | INTRAVENOUS | Status: AC
  Administered 2022-09-15: 50 meq via INTRAVENOUS
  Filled 2022-09-15: qty 50

## 2022-09-15 MED ORDER — LINACLOTIDE 145 MCG PO CAPS
290.0000 ug | ORAL_CAPSULE | Freq: Every evening | ORAL | Status: DC
  Administered 2022-09-17 – 2022-09-28 (×8): 290 ug via ORAL
  Filled 2022-09-15 (×15): qty 2

## 2022-09-15 MED ORDER — SODIUM ZIRCONIUM CYCLOSILICATE 5 G PO PACK
5.0000 g | PACK | Freq: Once | ORAL | Status: AC
  Administered 2022-09-15: 5 g via ORAL
  Filled 2022-09-15: qty 1

## 2022-09-15 MED ORDER — DEXTROSE 50 % IV SOLN
1.0000 | Freq: Once | INTRAVENOUS | Status: AC
  Administered 2022-09-15: 50 mL via INTRAVENOUS
  Filled 2022-09-15: qty 50

## 2022-09-15 MED ORDER — ATORVASTATIN CALCIUM 10 MG PO TABS
10.0000 mg | ORAL_TABLET | Freq: Every day | ORAL | Status: DC
  Administered 2022-09-16 – 2022-09-28 (×11): 10 mg via ORAL
  Filled 2022-09-15 (×13): qty 1

## 2022-09-15 MED ORDER — VANCOMYCIN HCL IN DEXTROSE 1-5 GM/200ML-% IV SOLN: 1000.0000 mg | INTRAVENOUS | Status: DC

## 2022-09-15 MED ORDER — HYDROMORPHONE HCL 1 MG/ML IJ SOLN
0.5000 mg | Freq: Once | INTRAMUSCULAR | Status: DC
  Filled 2022-09-15: qty 1

## 2022-09-15 MED ORDER — INSULIN ASPART 100 UNIT/ML IV SOLN
5.0000 [IU] | Freq: Once | INTRAVENOUS | Status: AC
  Administered 2022-09-15: 5 [IU] via INTRAVENOUS

## 2022-09-15 MED ORDER — BUPROPION HCL ER (XL) 150 MG PO TB24
150.0000 mg | ORAL_TABLET | Freq: Every day | ORAL | Status: DC
  Administered 2022-09-16 – 2022-09-29 (×12): 150 mg via ORAL
  Filled 2022-09-15 (×15): qty 1

## 2022-09-15 MED ORDER — MIDODRINE HCL 5 MG PO TABS
ORAL_TABLET | ORAL | Status: AC
  Filled 2022-09-15: qty 2

## 2022-09-15 MED ORDER — HYDROMORPHONE HCL 1 MG/ML IJ SOLN
0.5000 mg | INTRAMUSCULAR | Status: DC | PRN
  Administered 2022-09-15 – 2022-09-17 (×6): 0.5 mg via INTRAVENOUS
  Filled 2022-09-15: qty 0.5
  Filled 2022-09-15: qty 1
  Filled 2022-09-15: qty 0.5
  Filled 2022-09-15 (×2): qty 1
  Filled 2022-09-15: qty 0.5

## 2022-09-15 MED ORDER — LIDOCAINE-PRILOCAINE 2.5-2.5 % EX CREA: 1.0000 | TOPICAL_CREAM | CUTANEOUS | Status: DC | PRN

## 2022-09-15 MED ORDER — SODIUM CHLORIDE 0.9 % IV SOLN
2.0000 g | Freq: Once | INTRAVENOUS | Status: AC
  Administered 2022-09-15: 2 g via INTRAVENOUS
  Filled 2022-09-15: qty 20

## 2022-09-15 MED ORDER — LIDOCAINE HCL (PF) 1 % IJ SOLN: 5.0000 mL | INTRAMUSCULAR | Status: DC | PRN

## 2022-09-15 MED ORDER — VANCOMYCIN HCL 2000 MG/400ML IV SOLN
2000.0000 mg | Freq: Once | INTRAVENOUS | Status: AC
  Administered 2022-09-15: 2000 mg via INTRAVENOUS
  Filled 2022-09-15 (×2): qty 400

## 2022-09-15 NOTE — ED Notes (Signed)
This paramedic attempted to start an IV and was unsuccessful. IV team order was placed at this time. MD made aware.

## 2022-09-15 NOTE — Sepsis Progress Note (Signed)
No fluids given. Pt receiving dialysis.

## 2022-09-15 NOTE — Progress Notes (Signed)
Pharmacy Antibiotic Note  Christian Dalton is a 81 y.o. male admitted on 09/15/2022 with cellulitis.  Pharmacy has been consulted for vancomycin dosing.  Patient presents with worsening bilateral feet pain. Planned to have AKA on 10/27. Now presumed to be septic. HD schedule PTA was TTS.   Plan: Vancomycin 2 g IV once followed by 1000 mg IV with HD (TTS). Adjust vancomycin dosing as needed based on HD plan. Ceftriaxone per MD   Monitor s/sx of infection, CBC, and follow-up duration of treatment based upon surgical plans.   Temp (24hrs), Avg:98.2 F (36.8 C), Min:98.2 F (36.8 C), Max:98.2 F (36.8 C)  Recent Labs  Lab 09/09/22 1550 09/15/22 1152 09/15/22 1155  WBC 19.1*  --  21.7*  CREATININE 8.26*  --  7.67*  LATICACIDVEN 1.2 1.7  --     Estimated Creatinine Clearance: 8.9 mL/min (A) (by C-G formula based on SCr of 7.67 mg/dL (H)).    Allergies  Allergen Reactions   Avelox [Moxifloxacin Hcl] Other (See Comments)    Hallucinations    Gabapentin Other (See Comments)    Caused staggering and red feet   Metoprolol Other (See Comments)    Dropped pulse too low; was taken off of this by MD   Quinolones Other (See Comments)    unknown   Shellfish Allergy Nausea And Vomiting and Other (See Comments)    Made patient VERY nauseous and he developed stomach cramps   Sulfa Antibiotics Other (See Comments)    Allergy is from childhood (reaction not recalled)   Tetracyclines & Related Other (See Comments)    Blisters on hands and arms   Adhesive [Tape] Itching and Rash    Paper tape only- skin cannot tolerate "heavy" tapes   Thank you for allowing pharmacy to be a part of this patient's care.  Sinda Du, PharmD Candidate 09/15/2022 1:32 PM

## 2022-09-15 NOTE — Sepsis Progress Note (Signed)
Sepsis protocol is being followed by [facility]. 

## 2022-09-15 NOTE — ED Notes (Signed)
Wife would like to be called when he gets back from dialysis with an update and let her know which room he is in

## 2022-09-15 NOTE — Telephone Encounter (Signed)
Pt's wife, [name], called stating that the pt can't wait until Friday for his surgery. She was calling for an ambulance to take him and wanted Dr Donzetta Matters to be notified. Informed her that Dr Donzetta Matters was doing procedures today, but I assured her I would send him a text page. Confirmed understanding.   Page sent to Dr Donzetta Matters and Inocente Salles, PA who was on call. Sam, PA called and information given.

## 2022-09-15 NOTE — Consult Note (Addendum)
[facility] Renal Consultation Note    Indication for Consultation:  Management of ESRD/hemodialysis; anemia, hypertension/volume and secondary hyperparathyroidism  LXB:WIOMB, Christian Griffes, MD  HPI: Christian Dalton is a 81 y.o. male. ESRD on HD TTS.  Past medical history significant for COPD, atrial fibrillation, DM, polycystic kidney disease, PAD complicated by lower extremity limb ischemia with worsening bilateral lower extremity wounds.   Patient came in today due to worsening pain in his lower extremities. He is scheduled for a right BKA or AKA this Friday with Dr. Donzetta Matters but would like to have the surgery done sooner if possible.   He reports he has not been able to complete his full dialysis time as of late and typically only makes it through 2 hours of treatment. Last dialysis session was 10/21. Has been consuming foods high in K such as orange juice and bananas. Discussed the need for HD today for hyperkalemia and fluid optimization before planned surgery tomorrow.  Has complaints of nausea but not vomiting. Denies fevers, chest pain, SOB.   Review of outpatient records shows chronic non compliance, typically not staying more than 2 hours for the last year.  They have been watching ulcers over his LU AVF which have been present for several months but have worsened lately.  Denies pain, bleeding or issue with AVF running during treatment.   Pertinent findings during work up include worsening gangrene of b/l feet R>L, K 6.6, BUN 61, SCr 7.67, K 11.2, xray of b/l feet with no fractures or lytic lesions.  Patient admitted for further management.    Past Medical History:  Diagnosis Date   Acute cholecystitis 08/17/2018   Anemia    Arthritis    Atrial fibrillation (HCC)    CAD (coronary artery disease)    Cholecystitis    COPD (chronic obstructive pulmonary disease) (HCC)    Depression    Diabetes mellitus without complication (HCC)    Dysrhythmia    ESRD (end stage renal  disease) on dialysis (HCC)    HOH (hard of hearing)    Hypertension    Hypothyroidism    OSA (obstructive sleep apnea)    does not wear cpap   Peripheral vascular disease ([place])    Pneumonia    Spinal stenosis    Wears dentures    wears top dentures   Wears glasses    Wears hearing aid in both ears    Past Surgical History:  Procedure Laterality Date   ABDOMINAL AORTOGRAM N/A 01/15/2021   Procedure: ABDOMINAL AORTOGRAM;  Surgeon: Serafina Mitchell, MD;  Location: [place] [facility];  Service: Cardiovascular;  Laterality: N/A;   ABDOMINAL AORTOGRAM W/LOWER EXTREMITY Bilateral 12/11/2020   Procedure: ABDOMINAL AORTOGRAM W/LOWER EXTREMITY;  Surgeon: Serafina Mitchell, MD;  Location: [place] [facility];  Service: Cardiovascular;  Laterality: Bilateral;   ABDOMINAL AORTOGRAM W/LOWER EXTREMITY N/A 03/17/2022   Procedure: ABDOMINAL AORTOGRAM W/LOWER EXTREMITY;  Surgeon: Waynetta Sandy, MD;  Location: [place] [facility];  Service: Cardiovascular;  Laterality: N/A;   ABDOMINAL AORTOGRAM W/LOWER EXTREMITY N/A 07/07/2022   Procedure: ABDOMINAL AORTOGRAM W/LOWER EXTREMITY;  Surgeon: Waynetta Sandy, MD;  Location: [place] [facility];  Service: Cardiovascular;  Laterality: N/A;   BASCILIC VEIN TRANSPOSITION Left 12/02/2019   Procedure: BASCILIC VEIN TRANSPOSITION LEFT FIRST STAGE;  Surgeon: Serafina Mitchell, MD;  Location: [place];  Service: Vascular;  Laterality: Left;   BASCILIC VEIN TRANSPOSITION Left 02/10/2020   Procedure: [place] LEFT SECOND STAGE;  Surgeon:  Serafina Mitchell, MD;  Location: [place];  Service: Vascular;  Laterality: Left;   BILIARY STENT PLACEMENT  11/25/2018   Procedure: [place];  Surgeon: [name], MD;  Location: [facility];  Service: Gastroenterology;;   BIOPSY  12/20/2018   Procedure: BIOPSY;  Surgeon: [name], MD;  Location: [facility];  Service: Gastroenterology;;   BIOPSY  08/03/2020   Procedure: BIOPSY;  Surgeon:  Otis Brace, MD;  Location: [facility];  Service: Gastroenterology;;  EGD and COLON   CARDIAC CATHETERIZATION     CHOLECYSTECTOMY N/A 11/14/2018   Procedure: LAPAROSCOPIC CHOLECYSTECTOMY WITH INTRAOPERATIVE CHOLANGIOGRAM;  Surgeon: Coralie Keens, MD;  Location: [place];  Service: General;  Laterality: N/A;   COLONOSCOPY W/ BIOPSIES AND POLYPECTOMY     COLONOSCOPY WITH PROPOFOL N/A 08/03/2020   Procedure: COLONOSCOPY WITH PROPOFOL;  Surgeon: Otis Brace, MD;  Location: [facility];  Service: Gastroenterology;  Laterality: N/A;   ERCP N/A 11/25/2018   Procedure: ENDOSCOPIC RETROGRADE CHOLANGIOPANCREATOGRAPHY (ERCP);  Surgeon: [name], MD;  Location: [place];  Service: Gastroenterology;  Laterality: N/A;   ESOPHAGOGASTRODUODENOSCOPY N/A 12/20/2018   Procedure: ESOPHAGOGASTRODUODENOSCOPY (EGD);  Surgeon: [name], MD;  Location: [facility];  Service: Gastroenterology;  Laterality: N/A;   ESOPHAGOGASTRODUODENOSCOPY (EGD) WITH PROPOFOL N/A 08/03/2020   Procedure: ESOPHAGOGASTRODUODENOSCOPY (EGD) WITH PROPOFOL;  Surgeon: Otis Brace, MD;  Location: [facility];  Service: Gastroenterology;  Laterality: N/A;   EYE SURGERY     cataract B/L   HAND SURGERY Right    IR ANGIOGRAM SELECTIVE EACH ADDITIONAL VESSEL  05/30/2022   IR ANGIOGRAM SELECTIVE EACH ADDITIONAL VESSEL  05/30/2022   IR ANGIOGRAM SELECTIVE EACH ADDITIONAL VESSEL  05/30/2022   IR ANGIOGRAM SELECTIVE EACH ADDITIONAL VESSEL  05/30/2022   IR ANGIOGRAM SELECTIVE EACH ADDITIONAL VESSEL  05/30/2022   IR ANGIOGRAM SELECTIVE EACH ADDITIONAL VESSEL  05/30/2022   IR ANGIOGRAM VISCERAL SELECTIVE  05/30/2022   IR DIALY SHUNT INTRO NEEDLE/INTRACATH INITIAL W/IMG LEFT Left 08/20/2018   IR EMBO ART  VEN HEMORR LYMPH EXTRAV  INC GUIDE ROADMAPPING  05/30/2022   IR PERC CHOLECYSTOSTOMY  08/19/2018   IR RADIOLOGIST EVAL & MGMT  09/29/2018   IR US GUIDE VASC ACCESS RIGHT  05/30/2022   LOWER EXTREMITY ANGIOGRAPHY Left 01/15/2021   Procedure:  Lower Extremity Angiography;  Surgeon: Serafina Mitchell, MD;  Location: [place] [facility];  Service: Cardiovascular;  Laterality: Left;   MULTIPLE TOOTH EXTRACTIONS     PERIPHERAL VASCULAR BALLOON ANGIOPLASTY Right 12/11/2020   Procedure: PERIPHERAL VASCULAR BALLOON ANGIOPLASTY;  Surgeon: Serafina Mitchell, MD;  Location: [place] [facility];  Service: Cardiovascular;  Laterality: Right;   PERIPHERAL VASCULAR BALLOON ANGIOPLASTY Left 01/15/2021   Procedure: PERIPHERAL VASCULAR BALLOON ANGIOPLASTY;  Surgeon: Serafina Mitchell, MD;  Location: [place] [facility];  Service: Cardiovascular;  Laterality: Left;  PT   POLYPECTOMY  08/03/2020   Procedure: POLYPECTOMY;  Surgeon: Otis Brace, MD;  Location: [facility];  Service: Gastroenterology;;   RESECTION OF ARTERIOVENOUS FISTULA ANEURYSM Left 10/27/2019   Procedure: Resection Of Left Upper Arm  Arteriovenous Fistula Aneurysm Times Two;  Surgeon: Serafina Mitchell, MD;  Location: [place];  Service: Vascular;  Laterality: Left;   SHOULDER SURGERY Right    SPHINCTEROTOMY  11/25/2018   Procedure: SPHINCTEROTOMY;  Surgeon: [name], MD;  Location: [facility]COPY;  Service: Gastroenterology;;   Lavell Islam REMOVAL  12/20/2018   Procedure: STENT REMOVAL;  Surgeon: [name], MD;  Location: [facility];  Service: Gastroenterology;;   TIBIA FRACTURE SURGERY  Family History  Problem Relation Age of Onset   Heart disease Mother    Heart disease Father    Heart disease Sister    Social History:  reports that he has never smoked. He has quit using smokeless tobacco.  His smokeless tobacco use included chew. He reports that he does not currently use alcohol. He reports that he does not use drugs. Allergies  Allergen Reactions   Avelox [Moxifloxacin Hcl] Other (See Comments)    Hallucinations    Gabapentin Other (See Comments)    Caused staggering and red feet   Metoprolol Other (See Comments)    Dropped pulse too low; was taken off of this by MD    Quinolones Other (See Comments)    unknown   Shellfish Allergy Nausea And Vomiting and Other (See Comments)    Made patient VERY nauseous and he developed stomach cramps   Sulfa Antibiotics Other (See Comments)    Allergy is from childhood (reaction not recalled)   Tetracyclines & Related Other (See Comments)    Blisters on hands and arms   Adhesive [Tape] Itching and Rash    Paper tape only- skin cannot tolerate "heavy" tapes   Prior to Admission medications   Medication Sig Start Date End Date Taking? Authorizing Provider  aspirin EC 81 MG tablet Take 81 mg by mouth at bedtime.     [provider]  atorvastatin (LIPITOR) 10 MG tablet Take 10 mg by mouth at bedtime.    [provider]  buPROPion (WELLBUTRIN XL) 150 MG 24 hr tablet Take 150 mg by mouth every evening.    [provider]  carvedilol (COREG) 3.125 MG tablet Take 3.125 mg by mouth daily. 08/28/22   [provider]  cephALEXin (KEFLEX) 500 MG capsule Take 1 capsule (500 mg total) by mouth 3 (three) times daily. Patient not taking: Reported on 09/02/2022 06/18/22   Horton, Barbette Hair, MD  docusate sodium (COLACE) 100 MG capsule Take 100 mg by mouth 2 (two) times daily.     [provider]  escitalopram (LEXAPRO) 10 MG tablet Take 10 mg by mouth at bedtime.  08/30/15   [provider]  fluticasone (FLONASE) 50 MCG/ACT nasal spray Place 1 spray into both nostrils daily as needed for allergies or rhinitis.    [provider]  folic acid (FOLVITE) 1 MG tablet Take 1 mg by mouth daily.    [provider]  lactulose, encephalopathy, (CHRONULAC) 10 GM/15ML SOLN Take 10 g by mouth every evening. 08/02/18   [provider]  levothyroxine (SYNTHROID, LEVOTHROID) 125 MCG tablet Take 125 mcg by mouth daily before breakfast.    [provider]  lidocaine-prilocaine (EMLA) cream Apply 1 application topically Every Tuesday,Thursday,and Saturday with dialysis.  08/02/18   [provider]  linaclotide (LINZESS) 290 MCG CAPS capsule Take 290 mcg by mouth every evening.     [provider]  lisinopril (ZESTRIL) 2.5 MG tablet Take 2.5 mg by mouth daily. 06/30/22   [provider]  loratadine (CLARITIN) 10 MG tablet Take 10 mg by mouth daily.     [provider]  montelukast (SINGULAIR) 10 MG tablet Take 10 mg every evening by mouth.    [provider]  nitroGLYCERIN (NITROSTAT) 0.4 MG SL tablet Place 0.4 mg under the tongue every 5 (five) minutes as needed for chest pain.    [provider]  oxyCODONE-acetaminophen (PERCOCET) 10-325 MG tablet Take 1 tablet by mouth every 4 (four) hours as  needed for pain. 12/09/18   [provider]  PROAIR HFA 108 (90 Base) MCG/ACT inhaler Inhale 2 puffs into the lungs every 6 (six) hours as needed for shortness of breath. 05/27/20   [provider]  Propylene Glycol (SYSTANE BALANCE) 0.6 % SOLN Place 1 drop into both eyes daily as needed (dry eyes).    [provider]  sevelamer carbonate (RENVELA) 800 MG tablet Take 3 tablets (2,400 mg total) by mouth 3 (three) times daily with meals. Patient taking differently: Take 2,400 mg by mouth 2 (two) times daily with a meal. 08/24/18   Elgergawy, Silver Huguenin, MD   Current Facility-Administered Medications  Medication Dose Route Frequency Provider Last Rate Last Admin   cefTRIAXone (ROCEPHIN) 2 g in sodium chloride 0.9 % 100 mL IVPB  2 g Intravenous Once Romana Juniper, MD       Derrill Memo ON 09/16/2022] Chlorhexidine Gluconate Cloth 2 % PADS 6 each  6 each Topical Q0600 Penninger, Ria Comment, PA       HYDROmorphone (DILAUDID) injection 0.5 mg  0.5 mg Intravenous Once Romana Juniper, MD       lactated ringers infusion   Intravenous Continuous Romana Juniper, MD       metroNIDAZOLE (FLAGYL) IVPB 500 mg  500 mg Intravenous Once Romana Juniper, MD       sodium bicarbonate injection 50 mEq   50 mEq Intravenous Once Romana Juniper, MD       sodium zirconium cyclosilicate (LOKELMA) packet 5 g  5 g Oral Once Romana Juniper, MD       Derrill Memo ON 09/16/2022] vancomycin (VANCOCIN) IVPB 1000 mg/200 mL premix  1,000 mg Intravenous Q T,Th,Sa-HD Bertis Ruddy, RPH       vancomycin (VANCOREADY) IVPB 2000 mg/400 mL  2,000 mg Intravenous Once Bertis Ruddy, [facility]       Current Outpatient Medications  Medication Sig Dispense Refill   aspirin EC 81 MG tablet Take 81 mg by mouth at bedtime.      atorvastatin (LIPITOR) 10 MG tablet Take 10 mg by mouth at bedtime.     buPROPion (WELLBUTRIN XL) 150 MG 24 hr tablet Take 150 mg by mouth every evening.     carvedilol (COREG) 3.125 MG tablet Take 3.125 mg by mouth daily.     cephALEXin (KEFLEX) 500 MG capsule Take 1 capsule (500 mg total) by mouth 3 (three) times daily. (Patient not taking: Reported on 09/02/2022) 21 capsule 0   docusate sodium (COLACE) 100 MG capsule Take 100 mg by mouth 2 (two) times daily.      escitalopram (LEXAPRO) 10 MG tablet Take 10 mg by mouth at bedtime.      fluticasone (FLONASE) 50 MCG/ACT nasal spray Place 1 spray into both nostrils daily as needed for allergies or rhinitis.     folic acid (FOLVITE) 1 MG tablet Take 1 mg by mouth daily.     lactulose, encephalopathy, (CHRONULAC) 10 GM/15ML SOLN Take 10 g by mouth every evening.  4   levothyroxine (SYNTHROID, LEVOTHROID) 125 MCG tablet Take 125 mcg by mouth daily before breakfast.     lidocaine-prilocaine (EMLA) cream Apply 1 application topically Every Tuesday,Thursday,and Saturday with dialysis.  4   linaclotide (LINZESS) 290 MCG CAPS capsule Take 290 mcg by mouth every evening.      lisinopril (ZESTRIL) 2.5 MG tablet Take 2.5 mg by mouth daily.     loratadine (CLARITIN) 10 MG tablet Take 10 mg by mouth daily.      montelukast (SINGULAIR) 10 MG  tablet Take 10 mg every evening by mouth.     nitroGLYCERIN (NITROSTAT) 0.4 MG SL tablet Place 0.4 mg under the  tongue every 5 (five) minutes as needed for chest pain.     oxyCODONE-acetaminophen (PERCOCET) 10-325 MG tablet Take 1 tablet by mouth every 4 (four) hours as needed for pain.     PROAIR HFA 108 (90 Base) MCG/ACT inhaler Inhale 2 puffs into the lungs every 6 (six) hours as needed for shortness of breath.     Propylene Glycol (SYSTANE BALANCE) 0.6 % SOLN Place 1 drop into both eyes daily as needed (dry eyes).     sevelamer carbonate (RENVELA) 800 MG tablet Take 3 tablets (2,400 mg total) by mouth 3 (three) times daily with meals. (Patient taking differently: Take 2,400 mg by mouth 2 (two) times daily with a meal.) 45 tablet 0   Labs: Basic Metabolic Panel: Recent Labs  Lab 09/09/22 1550 09/15/22 1155  NA 134* 134*  K 5.9* 6.6*  CL 93* 95*  CO2 25 22  GLUCOSE 117* 98  BUN 64* 61*  CREATININE 8.26* 7.67*  CALCIUM 10.0 9.4   Liver Function Tests: Recent Labs  Lab 09/09/22 1550 09/15/22 1155  AST 8* 9*  ALT 8 8  ALKPHOS 190* 231*  BILITOT 0.5 0.6  PROT 6.0* 5.4*  ALBUMIN 2.5* 2.1*   CBC: Recent Labs  Lab 09/09/22 1550 09/15/22 1155  WBC 19.1* 21.7*  NEUTROABS 17.1* 19.5*  HGB 11.8* 11.2*  HCT 38.9* 36.0*  MCV 89.6 88.5  PLT 411* 303   CBG: Recent Labs  Lab 09/15/22 1520  GLUCAP 102*   Studies/Results: DG Foot Complete Right  Result Date: 09/15/2022 CLINICAL DATA:  Soft tissue infection EXAM: RIGHT FOOT COMPLETE - 3+ VIEW COMPARISON:  07/10/2022 FINDINGS: No fracture or dislocation is seen. There are no focal lytic lesions. Extensive arterial calcifications are seen in soft tissues. No significant interval changes are noted. IMPRESSION: No fracture or dislocation is seen. There are no focal lytic lesions in right foot. Electronically Signed   By: Elmer Picker M.D.   On: 09/15/2022 12:56   DG Foot Complete Left  Result Date: 09/15/2022 CLINICAL DATA:  Pain, soft tissue infection EXAM: LEFT FOOT - COMPLETE 3+ VIEW COMPARISON:  07/10/2022 FINDINGS: No  fracture or dislocation is seen. There are no focal lytic lesions. Extensive arterial calcifications are seen in soft tissues. There is subcutaneous linear calcification posterior to the calcaneus with no significant change. IMPRESSION: No fracture or dislocation is seen. There are no focal lytic lesions in the left foot. Electronically Signed   By: Elmer Picker M.D.   On: 09/15/2022 12:55    ROS: All others negative except those listed in HPI.   Physical Exam: Vitals:   09/15/22 1315 09/15/22 1330 09/15/22 1345 09/15/22 1415  BP: 119/60 (!) 106/59 (!) 103/56 (!) 112/55  Pulse:  78 75 80  Resp:   15 16  Temp:      SpO2:  97% 98% 98%     General: overweight male laying in bed in NAD Head: NCAT sclera not icteric MMM Lungs: Fine crackles in bilateral lower lung fields. No wheeze or rhonchi. Breathing is unlabored. Heart: RRR. No murmur, rubs or gallops.  Abdomen: soft, nontender, +BS, no guarding, no rebound tenderness Lower extremities: Bilateral lower extremities with pitting edema. Malodorous R LE with black gangrenous phalanges and erythematous forefoot and midfoot on ventral side with necrosis extending up to distal heel on plantar surface. L LE with  first and second phalange necrosis with forefoot and midfoot erythema. See Vascular note for pictures Neuro: AAOx3. Moves all extremities spontaneously. Psych:  Responds to questions appropriately with a normal affect. Dialysis Access: LUE AVF +b/t - 3 ulcers w/surrounding erythema over AVF  Dialysis Orders:  TTS - [facility]  3 hrs 45 min, BFR 425, DFR 800,  EDW 88.9 kg, 2 K/ 2 Ca  Access: LUE AVF  No Heparin  Mircera 50 mcg q2wks - last 10/19 Calcitriol 1.75 mcg PO qHD Venofer qweek    Assessment/Plan:  Gangrenous LE Wound infection - Bilateral lower extremity gangrenous wounds. Plan for R leg amputation tomorrow 10/24. Level to be determined by Dr. Donzetta Matters. Management per vascular surgery and primary team.  Hyperkalemia -  K 6.6.  No acute changes on EKG. HD scheduled for today in addition to medical management. Continue to monitor.  Chronic ulcer on AVF - Worsening.  No spontaneous bleeding. Stick away from and have VVS eval.   ESRD on HD TTS - Last HD 10/21. Plan for HD today off schedule. Will likely resume regular schedule unless urgent indication prior.   Hypertension/volume  - BP low. Volume overloaded on exam. Hold home meds.  Has not been completing full HD sessions outpatient. HD today with UF to max goal as tolerated. BP limiting fluid removal.   Anemia of CKD - Hgb 11.2. Receives venofer every week - hold d/t infection. Continue to monitor. Transfuse for Hgb <7. No indication for ESA at this time.  Expect drop in Hgb post surgery.   Secondary Hyperparathyroidism -  Ca 9.4. Check Phos.   Nutrition - Renal diet with fluid restriction.  A fib DM - per PMD    Cathleen Fears, [facility] PA-S2  Seen and examined independently this afternoon.  Agree with note and exam as documented above by physician extender with her student and as noted here.  Patient with ESRD on HD TTS, PAD who presented with worsening lower extremity wounds and pain.  Plan is for amputation and he states this has been moved up to tomorrow.  He was found to be  hyperkalemic and hypotensive.    General adult male in bed in no acute distress HEENT normocephalic atraumatic extraocular movements intact sclera anicteric Neck supple trachea midline Lungs clear to auscultation bilaterally normal work of breathing at rest  Heart S1S2 no rub Abdomen soft nontender nondistended Extremities no edema  Psych normal mood and affect Access LUE AVF in use  Gangrenous lower extremity wounds - vascular surgery has been consulted and plans for amputation on 10/24. Abx per primary team   Hyperkalemia - predates admission - he was brought up for emergent HD. I stopped LR.  Medical management with limited role for this patient - will need renal diet when taking  PO    Chronic ulcer on AVF - as above - stick away and appreciate VVS evaluation and assistance  ESRD - as above, once nephrology noted patient we dialyzed urgently today off schedule.  Assess dialysis needs daily   Hypotension - home meds are off.  Limited UF due to the hypotension.  We gave midodrine and albumin with HD to support. If drops further may need to start a pressor  Anemia CKD - no ESA indicated at this time   Secondary hyperparathyroidism - continue outpatient regimen   Claudia Desanctis, MD 09/15/2022 7:35 PM

## 2022-09-15 NOTE — H&P (Addendum)
Date: 09/15/2022               Patient Name:  Christian Dalton MRN: [id number]  DOB: 02-03-1941 Age / Sex: 81 y.o., male   PCP: Maris Berger, MD              Medical Service: Internal Medicine Teaching Service              Attending Physician: Dr. Evette Doffing, Mallie Mussel, *    First Contact: Corene Cornea, MS3 Pager: [phone]  Second Contact: Dr. Gaylyn Rong  Pager: [phone]  Third Contact Dr. Farrel Gordon Pager: [phone]       After Hours (After 5p/  First Contact Pager: [phone]  weekends / holidays): Second Contact Pager: [phone]   Chief Complaint: Bilateral foot pain  History of Present Illness:  Christian Dalton is a 81 y.o. male with an extensive past medical history including peripheral artery disease causing dry gangrene, ESRD on hemodialysis, a-fib, diabetes mellitus, and HFrEF 20-25% presenting for worsening bilateral foot pain. The patient reports he has had bilateral foot pain for months, worsening over the past few days. He has also noted some drainage from his wounds. He has some sensation in both feet, but is unable to ambulate. Denies fever, chest pain, and shortness of breath. He is on a Tuesday, Thursday, Saturday dialysis schedule.   Meds:  No outpatient medications have been marked as taking for the 09/15/22 encounter [employer] Encounter).  ASA 81 Lipitor 10 Wellbutrin 150 Coreg 3.125 BID Lexapro 10 QHS Flonase Folic acid Lactulose 14G QHS Levothyroxine 125 mcg Lisinopril 2.5  Loratadine  Singulair 10 mg QHS Nitroglycerin 0.4 mg PRN Percocet 10-325 q4 PRN Proair 108 q6 PRN Renvela 800 TID with meals   Allergies: Allergies as of 09/15/2022 - Review Complete 09/15/2022  Allergen Reaction Noted   Avelox [moxifloxacin hcl] Other (See Comments) 10/05/2017   Gabapentin Other (See Comments) 04/07/2019   Metoprolol Other (See Comments) 04/07/2019   Quinolones Other (See Comments) 08/16/2021   Shellfish allergy Nausea And Vomiting and Other (See Comments)  11/12/2018   Sulfa antibiotics Other (See Comments) 05/16/2016   Tetracyclines & related Other (See Comments) 05/16/2016   Adhesive [tape] Itching and Rash 04/07/2019   Past Medical History:  Diagnosis Date   Acute cholecystitis 08/17/2018   Anemia    Arthritis    Atrial fibrillation (HCC)    CAD (coronary artery disease)    Cholecystitis    COPD (chronic obstructive pulmonary disease) ([place])    Depression    Diabetes mellitus without complication (HCC)    Dysrhythmia    ESRD (end stage renal disease) on dialysis ([place])    HOH (hard of hearing)    Hypertension    Hypothyroidism    OSA (obstructive sleep apnea)    does not wear cpap   Peripheral vascular disease ([place])    Pneumonia    Spinal stenosis    Wears dentures    wears top dentures   Wears glasses    Wears hearing aid in both ears     Family History: (per chart review) - Mother: heart disease - Father: heart disease - Sister: heart disease  Social History:  - Lives in [place], [place] with his wife - Denies tobacco use, current EtOH use, and recreational drug use  Review of Systems: A complete ROS was negative except as per HPI.   Physical Exam: Blood pressure 90/78, pulse 75, temperature 98 F (36.7 C), temperature source Oral, resp. rate 14, SpO2 [place] [facility])  78 %.  General: No acute distress Cardiovascular: Regular rate and rhythm; palpable dorsalis pedis pulses bilaterally. Pulmonary: Lungs clear to auscultation bilaterally. No wheezes, rales, or crackles.  Extremities: LUE AVF being used for HD. Lower and upper extremities edematous. Skin:    Right foot   Right foot    Left foot    Left foot   Assessment & Plan by Problem: Principal Problem:   Gangrene of both feet (HCC)  Christian Dalton is a 81 y.o. male with PMH ESRD on TTS HD, T2DM, HTN, CAD, HLD, Hypothyroidism, Depression, COPD, HFrEF, peripheral artery disease presenting for worsening bilateral foot gangrene and scheduled for R AKA on  10/24.  #Bilateral foot gangrene The patient presents with worsening gangrene and pain. [facility] has assessed the patient and plan to do right leg amputation tomorrow afternoon. Will follow their recommendations.  - NPO after midnight  - dilaudid 0.5 q4 PRN - Vancomycin per pharmacy -Trend CBC, f\u blood cultures  #ESRD on hemodialysis  ESRD likely 2/2 T2DM on TTS HD. Will be monitoring his low blood pressures, currently 76/55, and he is receiving albumin and midodrine PRN for blood pressure support. -midodrine 10 TID with meals -renvela 800 BID with meals  #T2DM A1c 6.0 earlier this month.  -SSI q4 post op  #HTN #CAD #HLD #HFrEF Assess while in HD unit. hypervolemicOn lisinopril 2.'5mg'$  daily and coreg 3.125 BID, holding given low BP at HD. Continue lipitor '10mg'$ .  #COPD Respiratory status stable on RA in HD.  -continue singulair '10mg'$  QHS, albuterol nebs q6h prn  #Depression Continue welbutrin 150 mg daily, lexapro 10 mg QHS  #Hypothyroidism Continue levothyroxine 125 mcg daily   Dispo: Admit patient to Inpatient with expected length of stay greater than 2 midnights.  Signed: Paulo Fruit, Medical Student 09/15/2022, 5:45 PM    I have seen and examined the patient myself, and I have reviewed the note by Corene Cornea, [place] 3 and was present during the interview and physical exam.    Signed: Linus Galas, MD 09/15/2022, 7:31 PM

## 2022-09-15 NOTE — ED Triage Notes (Signed)
Pt presents to ED from home via [facility] for bilateral feet pain. Pt states he has planned amputation on Friday, and would like to return to ED to assess if this could be done sooner. Pt reports worsening pain. No new fevers per pt.

## 2022-09-15 NOTE — ED Provider Notes (Signed)
[facility]GENCY DEPART[facility] Provider Note   CSN: [id number] Arrival date & time: 09/15/22  1116     History Chief Complaint  Patient presents with   Wound Infection    Christian Dalton is a 81 y.o. male living with a history COPD,  atrial fibrillation, DM, polycystic kidney disease, ESRD on HD T,Th, Sa,  PAD c/b chronic lower extremity limb ischemia with worsening bilateral lower extremities wounds  Patient sees Dr. Donzetta Matters from vascular and was seen on 10/4 for evaluation for bilateral amputation, which is currently scheduled for 09/19/2022. At the time, patient was sent home and told to present to the ED if his pain got worse or if the wounds in his bilateral feet got worse.   Patient reports needing to use more pain medication in the last couple of days. Patient's wife reports that the wounds are progressing to the midfoot, have been weeping clear and purulent fluid, with worsening odor, and requiring more frequent dress changes daily.   Patient denies fevers, chills, night sweats, chest pain, shortness of breath.   Last HD session was Saturday 10/21.  Home Medications Prior to Admission medications   Medication Sig Start Date End Date Taking? Authorizing Provider  aspirin EC 81 MG tablet Take 81 mg by mouth at bedtime.     [provider]  atorvastatin (LIPITOR) 10 MG tablet Take 10 mg by mouth at bedtime.    [provider]  buPROPion (WELLBUTRIN XL) 150 MG 24 hr tablet Take 150 mg by mouth every evening.    [provider]  carvedilol (COREG) 3.125 MG tablet Take 3.125 mg by mouth daily. 08/28/22   [provider]  cephALEXin (KEFLEX) 500 MG capsule Take 1 capsule (500 mg total) by mouth 3 (three) times daily. Patient not taking: Reported on 09/02/2022 06/18/22   Horton, Barbette Hair, MD  docusate sodium (COLACE) 100 MG capsule Take 100 mg by mouth 2 (two) times daily.     [provider]  escitalopram (LEXAPRO) 10 MG  tablet Take 10 mg by mouth at bedtime.  08/30/15   [provider]  fluticasone (FLONASE) 50 MCG/ACT nasal spray Place 1 spray into both nostrils daily as needed for allergies or rhinitis.    [provider]  folic acid (FOLVITE) 1 MG tablet Take 1 mg by mouth daily.    [provider]  lactulose, encephalopathy, (CHRONULAC) 10 GM/15ML SOLN Take 10 g by mouth every evening. 08/02/18   [provider]  levothyroxine (SYNTHROID, LEVOTHROID) 125 MCG tablet Take 125 mcg by mouth daily before breakfast.    [provider]  lidocaine-prilocaine (EMLA) cream Apply 1 application topically Every Tuesday,Thursday,and Saturday with dialysis. 08/02/18   [provider]  linaclotide (LINZESS) 290 MCG CAPS capsule Take 290 mcg by mouth every evening.     [provider]  lisinopril (ZESTRIL) 2.5 MG tablet Take 2.5 mg by mouth daily. 06/30/22   [provider]  loratadine (CLARITIN) 10 MG tablet Take 10 mg by mouth daily.     [provider]  montelukast (SINGULAIR) 10 MG tablet Take 10 mg every evening by mouth.    [provider]  nitroGLYCERIN (NITROSTAT) 0.4 MG SL tablet Place 0.4 mg under the tongue every 5 (five) minutes as needed for chest pain.    [provider]  oxyCODONE-acetaminophen (PERCOCET) 10-325 MG tablet Take 1 tablet by mouth every 4 (four) hours as needed for pain. 12/09/18   [provider]  PROAIR HFA 108 (90 Base) MCG/ACT inhaler Inhale 2 puffs into the lungs every 6 (six) hours as needed for shortness of breath. 05/27/20   [provider]  Propylene Glycol (SYSTANE BALANCE) 0.6 % SOLN Place 1 drop into both eyes daily as needed (dry eyes).    [provider]  sevelamer carbonate (RENVELA) 800 MG tablet Take 3 tablets (2,400 mg total) by mouth 3 (three) times daily with meals. Patient taking differently: Take 2,400 mg by mouth 2 (two) times daily with a meal. 08/24/18    Elgergawy, Silver Huguenin, MD      Allergies    Avelox [moxifloxacin hcl], Gabapentin, Metoprolol, Quinolones, Shellfish allergy, Sulfa antibiotics, Tetracyclines & related, and Adhesive [tape]    Review of Systems   Review of Systems  Constitutional: Negative.   Respiratory:  Negative for chest tightness, shortness of breath and wheezing.   Cardiovascular:  Negative for chest pain, palpitations and leg swelling.  Gastrointestinal:  Negative for abdominal pain, nausea and vomiting.  Endocrine: Negative for polydipsia.  Skin:  Positive for color change and wound.       See media below  Neurological:  Positive for numbness.    Physical Exam Updated Vital Signs BP (!) 112/55   Pulse 80   Temp 98.2 F (36.8 C)   Resp 16   SpO2 98%  Physical Exam Constitutional:      General: He is not in acute distress.    Appearance: He is ill-appearing.  Cardiovascular:     Rate and Rhythm: Normal rate and regular rhythm.     Heart sounds: No murmur heard.    No friction rub. No gallop.     Comments: LUE fistula, bruit and thrill present Pulmonary:     Effort: Pulmonary effort is normal.     Breath sounds: Normal breath sounds.  Abdominal:     General: Abdomen is flat.     Palpations: Abdomen is soft.  Musculoskeletal:        General: Swelling present. No tenderness or deformity.     Right lower leg: Edema present.     Left lower leg: Edema present.     Comments: Bilateral antecubital nonpitting edema  Skin:    Comments: See media below  Neurological:     General: No focal deficit present.     Mental Status: He is alert and oriented to person, place, and time.  Psychiatric:        Mood and Affect: Mood normal.        Behavior: Behavior normal.    Right foot Dorsal view    Plantar view   Left foot Dorsal view   Plantar view       ED Results / Procedures / Treatments   Labs (all labs ordered are listed, but only abnormal results are displayed) Labs Reviewed  COMPREHENSIVE  METABOLIC PANEL - Abnormal; Notable for the following components:      Result Value   Sodium 134 (*)    Potassium 6.6 (*)    Chloride 95 (*)    BUN 61 (*)    Creatinine, Ser 7.67 (*)    Total Protein 5.4 (*)    Albumin 2.1 (*)    AST 9 (*)    Alkaline Phosphatase 231 (*)    GFR, Estimated 7 (*)    Anion gap 17 (*)    All other components within normal limits  CBC WITH DIFFERENTIAL/PLATELET - Abnormal; Notable for the following components:   WBC 21.7 (*)  RBC 4.07 (*)    Hemoglobin 11.2 (*)    HCT 36.0 (*)    RDW 17.5 (*)    Neutro Abs 19.5 (*)    Abs Immature Granulocytes 0.22 (*)    All other components within normal limits  SEDIMENTATION RATE - Abnormal; Notable for the following components:   Sed Rate 47 (*)    All other components within normal limits  C-REACTIVE PROTEIN - Abnormal; Notable for the following components:   CRP 19.7 (*)    All other components within normal limits  CULTURE, BLOOD (ROUTINE X 2)  CULTURE, BLOOD (ROUTINE X 2)  LACTIC ACID, PLASMA  LACTIC ACID, PLASMA  CBG MONITORING, ED    EKG EKG Interpretation  Date/Time:  Monday September 15 2022 13:46:48 EDT Ventricular Rate:  76 PR Interval:  237 QRS Duration: 167 QT Interval:  430 QTC Calculation: 484 R Axis:   15 Text Interpretation: Sinus rhythm Atrial premature complex Prolonged PR interval Left bundle branch block No significant change since last tracing Confirmed by Isla Pence [id number]) on 09/15/2022 2:03:54 PM  Radiology DG Foot Complete Right  Result Date: 09/15/2022 CLINICAL DATA:  Soft tissue infection EXAM: RIGHT FOOT COMPLETE - 3+ VIEW COMPARISON:  07/10/2022 FINDINGS: No fracture or dislocation is seen. There are no focal lytic lesions. Extensive arterial calcifications are seen in soft tissues. No significant interval changes are noted. IMPRESSION: No fracture or dislocation is seen. There are no focal lytic lesions in right foot. Electronically Signed   By: Elmer Picker  M.D.   On: 09/15/2022 12:56   DG Foot Complete Left  Result Date: 09/15/2022 CLINICAL DATA:  Pain, soft tissue infection EXAM: LEFT FOOT - COMPLETE 3+ VIEW COMPARISON:  07/10/2022 FINDINGS: No fracture or dislocation is seen. There are no focal lytic lesions. Extensive arterial calcifications are seen in soft tissues. There is subcutaneous linear calcification posterior to the calcaneus with no significant change. IMPRESSION: No fracture or dislocation is seen. There are no focal lytic lesions in the left foot. Electronically Signed   By: Elmer Picker M.D.   On: 09/15/2022 12:55    Procedures None  Medications Ordered in ED Medications  sodium zirconium cyclosilicate (LOKELMA) packet 5 g (has no administration in time range)  lactated ringers infusion (has no administration in time range)  cefTRIAXone (ROCEPHIN) 2 g in sodium chloride 0.9 % 100 mL IVPB (has no administration in time range)  metroNIDAZOLE (FLAGYL) IVPB 500 mg (has no administration in time range)  vancomycin (VANCOREADY) IVPB 2000 mg/400 mL (has no administration in time range)  vancomycin (VANCOCIN) IVPB 1000 mg/200 mL premix (has no administration in time range)  insulin aspart (novoLOG) injection 5 Units (has no administration in time range)    And  dextrose 50 % solution 50 mL (has no administration in time range)  sodium bicarbonate injection 50 mEq (has no administration in time range)  HYDROmorphone (DILAUDID) injection 0.5 mg (has no administration in time range)    ED Course/ Medical Decision Making/ A&P                           Medical Decision Making Amount and/or Complexity of Data Reviewed Labs: ordered. Radiology: ordered. ECG/medicine tests: ordered and independent interpretation performed.  Risk Prescription drug management. Decision regarding hospitalization. Elective major surgery with identified risk factors.   Mr. Stavros is a 81yo male with a hx of COPD, atrial fibrillation, DM,  polycystic kidney disease, ESRD on  HD T,Th, Sa,  PAD c/b chronic lower extremity limb ischemia with worsening gangrenous wounds in the bilateral lower extremities being considered for AKA by Dr. Donzetta Matters presenting to the ED with worsening bilateral foot gangrene, concerned for rapid progression to sepsis.   Patient is in no acute distress and vital signs are stable. There is presence of worsening of wet gangrene in the R>L compared to previous media. No crepitus on exam but do suspect polymicrobial infection. Given chronicity of wound, presentation, laboratory data and comorbidity, patient with high risk of progression to sepsis.  Leukocytosis to 21.7 since he was last seen on 19.1 on 10/4. Lactic acid 1.7. Patient has not been on antibiotics prior to presentation. Plan is to start patient on on maintenance fluids and give one dose of IV vancomycin per pharmacy, ceftriaxone, and flagyl. Will not plan to administer IVF bolus in setting of volume overload secondary to ESRD.   IV team is currently working on gaining IV access as patient bilateral upper extremities are edematous and difficult to access.   Despite having HD on Saturday, Patient was also found to have a K of 6.6 without acute changes on EKG. A dose of 5g of Lokelma, D50, and insulin were ordered.    Patient has a distant history of Afib, not on anticoagulation. He has remained on NSR with normal HR during this ED evaluation.    Consults: - Vascular surgery was consulted; plan for amputation of R foot on 10/24.  - Nephrology also at bedside with plan for HD tonight.   Disposition: Consult for admission was placed given patient's worsening of gangrenous wounds and complex medical management; spoke with Dr. Marlou Sa from [facility], who will accept patient to service.       Final Clinical Impression(s) / ED Diagnoses Final diagnoses:  Gangrene due to peripheral vascular disease ([place])   Romana Juniper, MD [facility] Internal Medicine  Program - PGY-1 09/15/22    Romana Juniper, MD 09/15/22 1518    Isla Pence, MD 09/16/22 [phone]

## 2022-09-15 NOTE — Procedures (Signed)
HD Note:  Some information was entered later than the data was gathered due to patient care needs. The stated time with the data is accurate.   Received patient on a stretcher from the ER to unit.  Alert and oriented.  Informed consent signed and in chart.   Patient's BP was too low to pull UF during treatment.  Medications were given, see MAR  Hand-off given to oncoming dialysis nurse.   Access used: L AVF Access issues: No issues      [facility]

## 2022-09-15 NOTE — Consult Note (Addendum)
Hospital Consult    Reason for Consult:  gangrenous feet bilaterally Requesting Physician:  Gilford Raid, MD MRN #:  [id number]  History of Present Illness: This is a 81 y.o. male who presented to the hospital with worsening gangrene of bilateral feet with right worse than left.  He states the pain is tolerable but he and his wife were concerned about his right foot worsening and getting sick from it.  He is scheduled for amputation on Friday but may need to have this sooner.   He states he has not had any fevers.  His wife states there has been some oozing from the right foot.  His wife has his x-ray of the left leg with hardware (picture placed on the chart)  Pt has hx of ESRD on dialysis T/T/S at the [facility] location.  The pt is on a statin for cholesterol management.  The pt is on a daily aspirin.   Other AC:  none The pt is on BB, ACEI for hypertension.   The pt is not diabetic.   Tobacco hx:  never  Past Medical History:  Diagnosis Date   Acute cholecystitis 08/17/2018   Anemia    Arthritis    Atrial fibrillation (HCC)    CAD (coronary artery disease)    Cholecystitis    COPD (chronic obstructive pulmonary disease) (HCC)    Depression    Diabetes mellitus without complication (HCC)    Dysrhythmia    ESRD (end stage renal disease) on dialysis (HCC)    HOH (hard of hearing)    Hypertension    Hypothyroidism    OSA (obstructive sleep apnea)    does not wear cpap   Peripheral vascular disease ([place])    Pneumonia    Spinal stenosis    Wears dentures    wears top dentures   Wears glasses    Wears hearing aid in both ears     Past Surgical History:  Procedure Laterality Date   ABDOMINAL AORTOGRAM N/A 01/15/2021   Procedure: ABDOMINAL AORTOGRAM;  Surgeon: Serafina Mitchell, MD;  Location: [place] [facility];  Service: Cardiovascular;  Laterality: N/A;   ABDOMINAL AORTOGRAM W/LOWER EXTREMITY Bilateral 12/11/2020   Procedure: ABDOMINAL AORTOGRAM W/LOWER EXTREMITY;   Surgeon: Serafina Mitchell, MD;  Location: [place] [facility];  Service: Cardiovascular;  Laterality: Bilateral;   ABDOMINAL AORTOGRAM W/LOWER EXTREMITY N/A 03/17/2022   Procedure: ABDOMINAL AORTOGRAM W/LOWER EXTREMITY;  Surgeon: Waynetta Sandy, MD;  Location: [place] [facility];  Service: Cardiovascular;  Laterality: N/A;   ABDOMINAL AORTOGRAM W/LOWER EXTREMITY N/A 07/07/2022   Procedure: ABDOMINAL AORTOGRAM W/LOWER EXTREMITY;  Surgeon: Waynetta Sandy, MD;  Location: [place] [facility];  Service: Cardiovascular;  Laterality: N/A;   BASCILIC VEIN TRANSPOSITION Left 12/02/2019   Procedure: BASCILIC VEIN TRANSPOSITION LEFT FIRST STAGE;  Surgeon: Serafina Mitchell, MD;  Location: [place];  Service: Vascular;  Laterality: Left;   BASCILIC VEIN TRANSPOSITION Left 02/10/2020   Procedure: [place] LEFT SECOND STAGE;  Surgeon: Serafina Mitchell, MD;  Location: [place];  Service: Vascular;  Laterality: Left;   BILIARY STENT PLACEMENT  11/25/2018   Procedure: [place];  Surgeon: [name], MD;  Location: [place];  Service: Gastroenterology;;   BIOPSY  12/20/2018   Procedure: BIOPSY;  Surgeon: [name], MD;  Location: [facility];  Service: Gastroenterology;;   BIOPSY  08/03/2020   Procedure: BIOPSY;  Surgeon: Otis Brace, MD;  Location: [facility];  Service: Gastroenterology;;  EGD and COLON  CARDIAC CATHETERIZATION     CHOLECYSTECTOMY N/A 11/14/2018   Procedure: LAPAROSCOPIC CHOLECYSTECTOMY WITH INTRAOPERATIVE CHOLANGIOGRAM;  Surgeon: Coralie Keens, MD;  Location: [place];  Service: General;  Laterality: N/A;   COLONOSCOPY W/ BIOPSIES AND POLYPECTOMY     COLONOSCOPY WITH PROPOFOL N/A 08/03/2020   Procedure: COLONOSCOPY WITH PROPOFOL;  Surgeon: Otis Brace, MD;  [facility]: [facility];  Service: Gastroenterology;  Laterality: N/A;   ERCP N/A 11/25/2018   Procedure: ENDOSCOPIC RETROGRADE CHOLANGIOPANCREATOGRAPHY (ERCP);  Surgeon: [name], MD;  Location: [place];  Service: Gastroenterology;  Laterality: N/A;   ESOPHAGOGASTRODUODENOSCOPY N/A 12/20/2018   Procedure: ESOPHAGOGASTRODUODENOSCOPY (EGD);  Surgeon: [name], MD;  Location: [facility];  Service: Gastroenterology;  Laterality: N/A;   ESOPHAGOGASTRODUODENOSCOPY (EGD) WITH PROPOFOL N/A 08/03/2020   Procedure: ESOPHAGOGASTRODUODENOSCOPY (EGD) WITH PROPOFOL;  Surgeon: Otis Brace, MD;  [facility];  Service: Gastroenterology;  Laterality: N/A;   EYE SURGERY     cataract B/L   HAND SURGERY Right    IR ANGIOGRAM SELECTIVE EACH ADDITIONAL VESSEL  05/30/2022   IR ANGIOGRAM SELECTIVE EACH ADDITIONAL VESSEL  05/30/2022   IR ANGIOGRAM SELECTIVE EACH ADDITIONAL VESSEL  05/30/2022   IR ANGIOGRAM SELECTIVE EACH ADDITIONAL VESSEL  05/30/2022   IR ANGIOGRAM SELECTIVE EACH ADDITIONAL VESSEL  05/30/2022   IR ANGIOGRAM SELECTIVE EACH ADDITIONAL VESSEL  05/30/2022   IR ANGIOGRAM VISCERAL SELECTIVE  05/30/2022   IR DIALY SHUNT INTRO NEEDLE/INTRACATH INITIAL W/IMG LEFT Left 08/20/2018   IR EMBO ART  VEN HEMORR LYMPH EXTRAV  INC GUIDE ROADMAPPING  05/30/2022   IR PERC CHOLECYSTOSTOMY  08/19/2018   IR RADIOLOGIST EVAL & MGMT  09/29/2018   IR US GUIDE VASC ACCESS RIGHT  05/30/2022   LOWER EXTREMITY ANGIOGRAPHY Left 01/15/2021   Procedure: Lower Extremity Angiography;  Surgeon: Serafina Mitchell, MD;  Location: [place] [facility];  Service: Cardiovascular;  Laterality: Left;   MULTIPLE TOOTH EXTRACTIONS     PERIPHERAL VASCULAR BALLOON ANGIOPLASTY Right 12/11/2020   Procedure: PERIPHERAL VASCULAR BALLOON ANGIOPLASTY;  Surgeon: Serafina Mitchell, MD;  Location: [place] [facility];  Service: Cardiovascular;  Laterality: Right;   PERIPHERAL VASCULAR BALLOON ANGIOPLASTY Left 01/15/2021   Procedure: PERIPHERAL VASCULAR BALLOON ANGIOPLASTY;  Surgeon: Serafina Mitchell, MD;  Location: [place] [facility];  Service: Cardiovascular;  Laterality: Left;  PT   POLYPECTOMY  08/03/2020   Procedure:  POLYPECTOMY;  Surgeon: Otis Brace, MD;  [facility]: [facility];  Service: Gastroenterology;;   RESECTION OF ARTERIOVENOUS FISTULA ANEURYSM Left 10/27/2019   Procedure: Resection Of Left Upper Arm  Arteriovenous Fistula Aneurysm Times Two;  Surgeon: Serafina Mitchell, MD;  Location: [place];  Service: Vascular;  Laterality: Left;   SHOULDER SURGERY Right    SPHINCTEROTOMY  11/25/2018   Procedure: SPHINCTEROTOMY;  Surgeon: [name], MD;  Location: [place];  Service: Gastroenterology;;   Lavell Islam REMOVAL  12/20/2018   Procedure: STENT REMOVAL;  Surgeon: [name], MD;  [facility];  Service: Gastroenterology;;   TIBIA FRACTURE SURGERY      Allergies  Allergen Reactions   Avelox [Moxifloxacin Hcl] Other (See Comments)    Hallucinations    Gabapentin Other (See Comments)    Caused staggering and red feet   Metoprolol Other (See Comments)    Dropped pulse too low; was taken off of this by MD   Quinolones Other (See Comments)    unknown   Shellfish Allergy Nausea And Vomiting and Other (See Comments)    Made patient VERY nauseous and he developed stomach  cramps   Sulfa Antibiotics Other (See Comments)    Allergy is from childhood (reaction not recalled)   Tetracyclines & Related Other (See Comments)    Blisters on hands and arms   Adhesive [Tape] Itching and Rash    Paper tape only- skin cannot tolerate "heavy" tapes    Prior to Admission medications   Medication Sig Start Date End Date Taking? Authorizing Provider  aspirin EC 81 MG tablet Take 81 mg by mouth at bedtime.     [provider]  atorvastatin (LIPITOR) 10 MG tablet Take 10 mg by mouth at bedtime.    [provider]  buPROPion (WELLBUTRIN XL) 150 MG 24 hr tablet Take 150 mg by mouth every evening.    [provider]  carvedilol (COREG) 3.125 MG tablet Take 3.125 mg by mouth daily. 08/28/22   [provider]  cephALEXin (KEFLEX) 500 MG capsule Take 1 capsule (500 mg total)  by mouth 3 (three) times daily. Patient not taking: Reported on 09/02/2022 06/18/22   Horton, Barbette Hair, MD  docusate sodium (COLACE) 100 MG capsule Take 100 mg by mouth 2 (two) times daily.     [provider]  escitalopram (LEXAPRO) 10 MG tablet Take 10 mg by mouth at bedtime.  08/30/15   [provider]  fluticasone (FLONASE) 50 MCG/ACT nasal spray Place 1 spray into both nostrils daily as needed for allergies or rhinitis.    [provider]  folic acid (FOLVITE) 1 MG tablet Take 1 mg by mouth daily.    [provider]  lactulose, encephalopathy, (CHRONULAC) 10 GM/15ML SOLN Take 10 g by mouth every evening. 08/02/18   [provider]  levothyroxine (SYNTHROID, LEVOTHROID) 125 MCG tablet Take 125 mcg by mouth daily before breakfast.    [provider]  lidocaine-prilocaine (EMLA) cream Apply 1 application topically Every Tuesday,Thursday,and Saturday with dialysis. 08/02/18   [provider]  linaclotide (LINZESS) 290 MCG CAPS capsule Take 290 mcg by mouth every evening.     [provider]  lisinopril (ZESTRIL) 2.5 MG tablet Take 2.5 mg by mouth daily. 06/30/22   [provider]  loratadine (CLARITIN) 10 MG tablet Take 10 mg by mouth daily.     [provider]  montelukast (SINGULAIR) 10 MG tablet Take 10 mg every evening by mouth.    [provider]  nitroGLYCERIN (NITROSTAT) 0.4 MG SL tablet Place 0.4 mg under the tongue every 5 (five) minutes as needed for chest pain.    [provider]  oxyCODONE-acetaminophen (PERCOCET) 10-325 MG tablet Take 1 tablet by mouth every 4 (four) hours as needed for pain. 12/09/18   [provider]  PROAIR HFA 108 (90 Base) MCG/ACT inhaler Inhale 2 puffs into the lungs every 6 (six) hours as needed for shortness of breath. 05/27/20   [provider]  Propylene Glycol (SYSTANE BALANCE) 0.6 % SOLN Place 1 drop into both eyes daily as needed (dry eyes).     [provider]  sevelamer carbonate (RENVELA) 800 MG tablet Take 3 tablets (2,400 mg total) by mouth 3 (three) times daily with meals. Patient taking differently: Take 2,400 mg by mouth 2 (two) times daily with a meal. 08/24/18   Elgergawy, Silver Huguenin, MD    Social History   Socioeconomic History   Marital status: Married    Spouse name: Di Kindle   Number of children: Not on file   Years of education: Not on file   Highest education level: Not  on file  Occupational History   Not on file  Tobacco Use   Smoking status: Never   Smokeless tobacco: Former    Types: Nurse, children's Use: Never used  Substance and Sexual Activity   Alcohol use: Not Currently   Drug use: No   Sexual activity: Not on file  Other Topics Concern   Not on file  Social History Narrative   Not on file   Social Determinants of Health   Financial Resource Strain: Low Risk  (08/17/2018)   Overall Financial Resource Strain (CARDIA)    Difficulty of Paying Living Expenses: Not hard at all  Food Insecurity: Unknown (08/17/2018)   Hunger Vital Sign    Worried About Running Out of Food in the Last Year: Patient refused    [place] in the Last Year: Patient refused  Transportation Needs: Unknown (08/17/2018)   PRAPARE - Transportation    Lack of Transportation (Medical): Patient refused    Lack of Transportation (Non-Medical): Patient refused  Physical Activity: Unknown (08/17/2018)   Exercise Vital Sign    Days of Exercise per Week: Patient refused    Minutes of Exercise per Session: Patient refused  Stress: No Stress Concern Present (08/17/2018)   [place]    Feeling of Stress : Only a little  Social Connections: Not on file  Intimate Partner Violence: Not on file     Family History  Problem Relation Age of Onset   Heart disease Mother    Heart disease Father    Heart disease Sister     ROS: '[x]'$  Positive   [  ] Negative   '[ ]'$  All sytems reviewed and are negative  Cardiac: '[x]'$  hx afib '[x]'$  hx CAD   Vascular: '[]'$  pain in legs while walking '[]'$  pain in legs at rest '[]'$  pain in legs at night '[x]'$  non-healing ulcers '[]'$  hx of DVT '[]'$  swelling in legs  Pulmonary: '[]'$  asthma/wheezing '[]'$  home O2  Neurologic: '[]'$  hx of CVA '[]'$  mini stroke   Hematologic: '[]'$  hx of cancer  Endocrine:   '[]'$  diabetes '[]'$  thyroid disease  GI '[]'$  GERD  GU: '[x]'$  CKD/renal failure '[x]'$  HD--'[]'$  M/W/F or '[x]'$  T/T/S  Psychiatric: '[]'$  anxiety '[]'$  depression  Musculoskeletal: '[]'$  arthritis '[]'$  joint pain  Integumentary: '[]'$  rashes '[]'$  ulcers  Constitutional: '[]'$  fever  '[]'$  chills  Physical Examination  Vitals:   09/15/22 1330 09/15/22 1345  BP: (!) 106/59 (!) 103/56  Pulse: 78 75  Resp:  15  Temp:    SpO2: 97% 98%   There is no height or weight on file to calculate BMI.  General:  WDWN in NAD Gait: Not observed HENT: WNL, normocephalic Pulmonary: normal non-labored breathing Cardiac: regular Vascular Exam/Pulses: + thrill LUA AVF Extremities: malodorous gangrenous changes bilateral feet with right worse than left  Right foot  Right foot  Left foot  Left foot   Xray left leg:  Musculoskeletal: no muscle wasting or atrophy  Neurologic: A&O X 3 Psychiatric:  The pt has Normal affect.   CBC    Component Value Date/Time   WBC 21.7 (H) 09/15/2022 1155   RBC 4.07 (L) 09/15/2022 1155   HGB 11.2 (L) 09/15/2022 1155   HCT 36.0 (L) 09/15/2022 1155   PLT 303 09/15/2022 1155   MCV 88.5 09/15/2022 1155   MCH 27.5 09/15/2022 1155   MCHC 31.1 09/15/2022 1155   RDW 17.5 (H) 09/15/2022 1155  LYMPHSABS 0.8 09/15/2022 1155   MONOABS 1.0 09/15/2022 1155   EOSABS 0.2 09/15/2022 1155   BASOSABS 0.1 09/15/2022 1155    BMET    Component Value Date/Time   NA 134 (L) 09/15/2022 1155   K 6.6 (HH) 09/15/2022 1155   CL 95 (L) 09/15/2022 1155   CO2 22 09/15/2022 1155   GLUCOSE 98 09/15/2022 1155   BUN 61  (H) 09/15/2022 1155   CREATININE 7.67 (H) 09/15/2022 1155   CALCIUM 9.4 09/15/2022 1155   GFRNONAA 7 (L) 09/15/2022 1155   GFRAA 7 (L) 10/26/2019 1626    COAGS: Lab Results  Component Value Date   INR 1.2 09/09/2022   INR 1.2 09/08/2022   INR 1.4 (H) 05/30/2022       ASSESSMENT/PLAN: This is a 81 y.o. male well known to [facility] here with worsening malodorous, gangrenous changes to bilateral feet with right worse than left   -pt had amputation planned for Friday, however, wife and pt feel this has worsened.  Will plan to do right leg amputation tomorrow afternoon.  Dr. Donzetta Matters to determine level of amputation.   -will need medical admission as he will need HD and medical management of medical issues.  -npo after midnight.  Ok to eat today from vascular standpoint.    Leontine Locket, PA-C [facility] [phone]   I have independently interviewed and examined patient and agree with PA assessment and plan above. Plan for R AKA Tuesday.  Morgan Keinath C. Donzetta Matters, MD [facility] [place] Office: [phone] Pager: [phone]

## 2022-09-15 NOTE — Hospital Course (Addendum)
10/30 last echo with [facility] 12/2021 POCUS Echo  Pericardium:  No significant pericardial effusion          LV function:  Normal (> 50% EF), Indeterminate          IVC collapsibility:  Normal      Interpretation:          Qualitatively normal left ventricular systolic function, No significant pericardial effusion, Indeterminate          Comments:  EF normal, low normal, difficult given poor windows, EF not severely depressed      10/29: pain is doing better today. Has not needed IV pain meds, just PO.   10/27 per [facility] PA: ok with discharge f/u in 4 weeks with [facility]VS Ill put in the f/u and watchful waiting on left toes to demarcate as long as no infection.   Admitted 09/15/2022  Allergies: Avelox [moxifloxacin hcl], Gabapentin, Metoprolol, Quinolones, Shellfish allergy, Sulfa antibiotics, Tetracyclines & related, and Adhesive [tape] Pertinent Hx: PAD, ESRD on TThSa HD, atrial fibrillation, T2DM, HFrEF  81 y.o. male p/w progressive pain of bilateral foot gangrene  *Bilateral foot dry gangrene: management per [facility]VS. Plan for R BKA 10/24, LLE intervention at later date. S/p metronidazole x1, on vanc now.  *ESRD on TThSa HD: Received HD today off-schedule in anticipation of procedure 10/24. Management per nephrology.   Consults: [facility], nephrology  Meds: dilaudid, vancomycin, atorvastatin, wellbutrin, lexapro, levothyroxine, linzess, montelukast, inhaler, renvela VTE ppx: SCDs IVF: None Diet: Carb modified, NPO at midnight    10/25 - feels pretty rough. Partner reports pain at RLE. Feels like pain meds are working. No nausea this morning. No bm yet. Passing a little gas. Surgery came by earlier - surgeon to come by later. No other complaints this morning. Wife states he takes lactulose at home and does have BM without it.  Hospital course:   #Bilateral foot ischemic necrosis  The patient presented to the ED on Monday, 10/23 for worsening pain of his bilateral foot ischemic necrosis, right greater  than left. He was previously scheduled for R AKA on Friday, 10/27. Vascular surgery assessed the patient and scheduled R AKA for Tuesday, 10/24. He was started on Vancomycin for possible soft tissue infection prior to surgery and blood cultures were drawn. He was also dialyzed on Monday, 10/23, in anticipation of surgery. The procedure was tolerated well, and his RLE healed without complications. He was titrated up to Dilaudid 4 mg BID for pain, with Dilaudid 1 mg IV for breakthrough pain. His lipitor 10 mg and aspirin 81 mg were continued for PAD. On 10/26, his LLE felt cold and a left dp pulse was not palpable. We ordered LLE doppler that showed no obvious evidence of stenosis. PT recommended discharge to SNF and vascular surgery plans for follow-up in 4-5 weeks for LLE evaluation. Medication changes at discharge include: stop Percocet, stop Trazodone 25 mg at night, stop lisinopril 2.5 mg, stop coreg 3.125 mg BID, continue dialysis medications including midodrine 10 mg, calcitriol 1.75 mcg, and continue dilaudid 2 mg BID for pain.   #ESRD on HD He returned to his normal HD schedule of T/Th/Sat and received midodrine 10 mg on dialysis days due to hypotension.   #Delirium   On Sunday, 10/29, he was found to be confused and agitated, and he was started on Haldol 1 mg PRN for agitation. This was changed to Trazodone 25 mg at bed time, and the next day he appeared to be oriented and appropriate.   #  T2DM He did not receive insulin during this admission, however, his blood sugar decreased to 49 on Saturday, 10/28. He received one dose of D50 50 mL solution and his blood sugar corrected. His PO intake increased, and he had no further hypoglycemia.   11/5- Sedated, oriented x 1, breathing ok, pain better. Doesn't know plan / where he's going next, asks about breakfast. Doesn't know month.

## 2022-09-16 ENCOUNTER — Inpatient Hospital Stay (HOSPITAL_COMMUNITY): Payer: Medicare Other | Admitting: Anesthesiology

## 2022-09-16 ENCOUNTER — Encounter (HOSPITAL_COMMUNITY)
Admission: EM | Disposition: A | Discharge status: Skilled Nursing Facility | Payer: Self-pay | Source: Home / Self Care | Attending: Student in an Organized Health Care Education/Training Program

## 2022-09-16 ENCOUNTER — Other Ambulatory Visit: Payer: Self-pay

## 2022-09-16 ENCOUNTER — Inpatient Hospital Stay (HOSPITAL_COMMUNITY): Admission: RE | Admit: 2022-09-16 | Payer: Medicare Other | Source: Home / Self Care | Admitting: Vascular Surgery

## 2022-09-16 ENCOUNTER — Inpatient Hospital Stay (HOSPITAL_COMMUNITY): Payer: Medicare Other | Admitting: Physician Assistant

## 2022-09-16 ENCOUNTER — Encounter (HOSPITAL_COMMUNITY): Payer: Self-pay | Admitting: Student in an Organized Health Care Education/Training Program

## 2022-09-16 DIAGNOSIS — I132 Hypertensive heart and chronic kidney disease with heart failure and with stage 5 chronic kidney disease, or end stage renal disease: Secondary | ICD-10-CM

## 2022-09-16 DIAGNOSIS — I2510 Atherosclerotic heart disease of native coronary artery without angina pectoris: Secondary | ICD-10-CM | POA: Diagnosis not present

## 2022-09-16 DIAGNOSIS — D631 Anemia in chronic kidney disease: Secondary | ICD-10-CM

## 2022-09-16 DIAGNOSIS — N186 End stage renal disease: Secondary | ICD-10-CM

## 2022-09-16 DIAGNOSIS — I96 Gangrene, not elsewhere classified: Secondary | ICD-10-CM | POA: Diagnosis not present

## 2022-09-16 DIAGNOSIS — I509 Heart failure, unspecified: Secondary | ICD-10-CM | POA: Diagnosis not present

## 2022-09-16 DIAGNOSIS — Z992 Dependence on renal dialysis: Secondary | ICD-10-CM

## 2022-09-16 DIAGNOSIS — E1122 Type 2 diabetes mellitus with diabetic chronic kidney disease: Secondary | ICD-10-CM

## 2022-09-16 DIAGNOSIS — E1151 Type 2 diabetes mellitus with diabetic peripheral angiopathy without gangrene: Secondary | ICD-10-CM

## 2022-09-16 HISTORY — PX: AMPUTATION: SHX166

## 2022-09-16 LAB — BASIC METABOLIC PANEL
Anion gap: 15 (ref 5–15)
BUN: 42 mg/dL — ABNORMAL HIGH (ref 8–23)
CO2: 27 mmol/L (ref 22–32)
Calcium: 9.1 mg/dL (ref 8.9–10.3)
Chloride: 93 mmol/L — ABNORMAL LOW (ref 98–111)
Creatinine, Ser: 5.7 mg/dL — ABNORMAL HIGH (ref 0.61–1.24)
GFR, Estimated: 9 mL/min — ABNORMAL LOW (ref >60)
Glucose, Bld: 55 mg/dL — ABNORMAL LOW (ref 70–99)
Potassium: 4.8 mmol/L (ref 3.5–5.1)
Sodium: 135 mmol/L (ref 135–145)

## 2022-09-16 LAB — TYPE AND SCREEN
ABO/RH(D): A POS
ABO/RH(D): A POS
Antibody Screen: NEGATIVE
Antibody Screen: NEGATIVE

## 2022-09-16 LAB — POCT I-STAT, CHEM 8
BUN: 41 mg/dL — ABNORMAL HIGH (ref 8–23)
Calcium, Ion: 1.23 mmol/L (ref 1.15–1.40)
Chloride: 92 mmol/L — ABNORMAL LOW (ref 98–111)
Creatinine, Ser: 6 mg/dL — ABNORMAL HIGH (ref 0.61–1.24)
Glucose, Bld: 118 mg/dL — ABNORMAL HIGH (ref 70–99)
HCT: 37 % — ABNORMAL LOW (ref 39.0–52.0)
Hemoglobin: 12.6 g/dL — ABNORMAL LOW (ref 13.0–17.0)
Potassium: 4.8 mmol/L (ref 3.5–5.1)
Sodium: 133 mmol/L — ABNORMAL LOW (ref 135–145)
TCO2: 32 mmol/L (ref 22–32)

## 2022-09-16 LAB — HEPATITIS B SURFACE ANTIBODY, QUANTITATIVE: Hep B S AB Quant (Post): 51.6 m[IU]/mL (ref >9.9)

## 2022-09-16 LAB — GLUCOSE, CAPILLARY
Glucose-Capillary: 112 mg/dL — ABNORMAL HIGH (ref 70–99)
Glucose-Capillary: 118 mg/dL — ABNORMAL HIGH (ref 70–99)
Glucose-Capillary: 67 mg/dL — ABNORMAL LOW (ref 70–99)

## 2022-09-16 LAB — CBG MONITORING, ED: Glucose-Capillary: 53 mg/dL — ABNORMAL LOW (ref 70–99)

## 2022-09-16 SURGERY — AMPUTATION, ABOVE KNEE
Anesthesia: General | Site: Knee | Laterality: Right

## 2022-09-16 MED ORDER — CHLORHEXIDINE GLUCONATE 0.12 % MT SOLN
OROMUCOSAL | Status: AC
  Administered 2022-09-16: 15 mL via OROMUCOSAL
  Filled 2022-09-16: qty 15

## 2022-09-16 MED ORDER — ONDANSETRON HCL 4 MG/2ML IJ SOLN
4.0000 mg | Freq: Four times a day (QID) | INTRAMUSCULAR | Status: DC | PRN
  Administered 2022-09-18: 4 mg via INTRAVENOUS
  Filled 2022-09-16: qty 2

## 2022-09-16 MED ORDER — HEPARIN SODIUM (PORCINE) 5000 UNIT/ML IJ SOLN
5000.0000 [IU] | Freq: Three times a day (TID) | INTRAMUSCULAR | Status: DC
  Administered 2022-09-16 – 2022-09-29 (×36): 5000 [IU] via SUBCUTANEOUS
  Filled 2022-09-16 (×36): qty 1

## 2022-09-16 MED ORDER — ROCURONIUM BROMIDE 10 MG/ML (PF) SYRINGE
PREFILLED_SYRINGE | INTRAVENOUS | Status: DC | PRN
  Administered 2022-09-16: 50 mg via INTRAVENOUS
  Administered 2022-09-16: 20 mg via INTRAVENOUS

## 2022-09-16 MED ORDER — FENTANYL CITRATE (PF) 250 MCG/5ML IJ SOLN
INTRAMUSCULAR | Status: AC
  Filled 2022-09-16: qty 5

## 2022-09-16 MED ORDER — CHLORHEXIDINE GLUCONATE CLOTH 2 % EX PADS: 6.0000 | MEDICATED_PAD | Freq: Once | CUTANEOUS | Status: DC

## 2022-09-16 MED ORDER — LACTATED RINGERS IV SOLN: INTRAVENOUS | Status: DC

## 2022-09-16 MED ORDER — SUGAMMADEX SODIUM 200 MG/2ML IV SOLN
INTRAVENOUS | Status: DC | PRN
  Administered 2022-09-16: 200 mg via INTRAVENOUS

## 2022-09-16 MED ORDER — ONDANSETRON HCL 4 MG/2ML IJ SOLN
INTRAMUSCULAR | Status: DC | PRN
  Administered 2022-09-16: 4 mg via INTRAVENOUS

## 2022-09-16 MED ORDER — DEXTROSE 50 % IV SOLN
INTRAVENOUS | Status: AC
  Filled 2022-09-16: qty 50

## 2022-09-16 MED ORDER — MAGNESIUM SULFATE 2 GM/50ML IV SOLN: 2.0000 g | Freq: Every day | INTRAVENOUS | Status: DC | PRN

## 2022-09-16 MED ORDER — FENTANYL CITRATE (PF) 250 MCG/5ML IJ SOLN
INTRAMUSCULAR | Status: DC | PRN
  Administered 2022-09-16 (×2): 50 ug via INTRAVENOUS
  Administered 2022-09-16: 25 ug via INTRAVENOUS
  Administered 2022-09-16: 50 ug via INTRAVENOUS

## 2022-09-16 MED ORDER — CHLORHEXIDINE GLUCONATE 0.12 % MT SOLN: 15.0000 mL | Freq: Once | OROMUCOSAL | Status: DC

## 2022-09-16 MED ORDER — ASPIRIN 81 MG PO TBEC: 81.0000 mg | DELAYED_RELEASE_TABLET | Freq: Every day | ORAL | Status: DC

## 2022-09-16 MED ORDER — HEPARIN SODIUM (PORCINE) 5000 UNIT/ML IJ SOLN: 5000.0000 [IU] | Freq: Three times a day (TID) | INTRAMUSCULAR | Status: DC

## 2022-09-16 MED ORDER — DEXTROSE 50 % IV SOLN
25.0000 mL | Freq: Once | INTRAVENOUS | Status: AC
  Administered 2022-09-16: 25 mL via INTRAVENOUS

## 2022-09-16 MED ORDER — ORAL CARE MOUTH RINSE: 15.0000 mL | Freq: Once | OROMUCOSAL | Status: DC

## 2022-09-16 MED ORDER — DEXTROSE 50 % IV SOLN
12.5000 g | Freq: Once | INTRAVENOUS | Status: AC
  Administered 2022-09-16: 12.5 g via INTRAVENOUS

## 2022-09-16 MED ORDER — PROPOFOL 10 MG/ML IV BOLUS
INTRAVENOUS | Status: AC
  Filled 2022-09-16: qty 20

## 2022-09-16 MED ORDER — CHLORHEXIDINE GLUCONATE 0.12 % MT SOLN: 15.0000 mL | Freq: Once | OROMUCOSAL | Status: AC

## 2022-09-16 MED ORDER — PHENYLEPHRINE 80 MCG/ML (10ML) SYRINGE FOR IV PUSH (FOR BLOOD PRESSURE SUPPORT)
PREFILLED_SYRINGE | INTRAVENOUS | Status: DC | PRN
  Administered 2022-09-16: 80 ug via INTRAVENOUS

## 2022-09-16 MED ORDER — POTASSIUM CHLORIDE CRYS ER 20 MEQ PO TBCR: 20.0000 meq | EXTENDED_RELEASE_TABLET | Freq: Every day | ORAL | Status: DC | PRN

## 2022-09-16 MED ORDER — 0.9 % SODIUM CHLORIDE (POUR BTL) OPTIME
TOPICAL | Status: DC | PRN
  Administered 2022-09-16: 1000 mL

## 2022-09-16 MED ORDER — ACETAMINOPHEN 10 MG/ML IV SOLN: 1000.0000 mg | Freq: Once | INTRAVENOUS | Status: DC | PRN

## 2022-09-16 MED ORDER — LIDOCAINE 2% (20 MG/ML) 5 ML SYRINGE
INTRAMUSCULAR | Status: DC | PRN
  Administered 2022-09-16: 60 mg via INTRAVENOUS

## 2022-09-16 MED ORDER — LEVOTHYROXINE SODIUM 25 MCG PO TABS
125.0000 ug | ORAL_TABLET | Freq: Every day | ORAL | Status: DC
  Administered 2022-09-16 – 2022-09-29 (×14): 125 ug via ORAL
  Filled 2022-09-16 (×14): qty 1

## 2022-09-16 MED ORDER — SODIUM CHLORIDE 0.9 % IV SOLN: INTRAVENOUS | Status: DC

## 2022-09-16 MED ORDER — DOCUSATE SODIUM 100 MG PO CAPS
100.0000 mg | ORAL_CAPSULE | Freq: Every day | ORAL | Status: DC
  Administered 2022-09-17 – 2022-09-29 (×10): 100 mg via ORAL
  Filled 2022-09-16 (×13): qty 1

## 2022-09-16 MED ORDER — ASPIRIN 81 MG PO TBEC
81.0000 mg | DELAYED_RELEASE_TABLET | Freq: Every day | ORAL | Status: DC
  Administered 2022-09-17 – 2022-09-29 (×11): 81 mg via ORAL
  Filled 2022-09-16 (×14): qty 1

## 2022-09-16 MED ORDER — VANCOMYCIN HCL IN DEXTROSE 1-5 GM/200ML-% IV SOLN: 1000.0000 mg | INTRAVENOUS | Status: DC

## 2022-09-16 MED ORDER — BACITRACIN ZINC 500 UNIT/GM EX OINT
TOPICAL_OINTMENT | CUTANEOUS | Status: AC
  Filled 2022-09-16: qty 28.35

## 2022-09-16 MED ORDER — OXYCODONE-ACETAMINOPHEN 5-325 MG PO TABS
1.0000 | ORAL_TABLET | ORAL | Status: DC | PRN
  Administered 2022-09-16 – 2022-09-17 (×3): 2 via ORAL
  Filled 2022-09-16 (×3): qty 2

## 2022-09-16 MED ORDER — ETOMIDATE 2 MG/ML IV SOLN
INTRAVENOUS | Status: AC
  Filled 2022-09-16: qty 10

## 2022-09-16 MED ORDER — FENTANYL CITRATE (PF) 100 MCG/2ML IJ SOLN: 25.0000 ug | INTRAMUSCULAR | Status: DC | PRN

## 2022-09-16 MED ORDER — ETOMIDATE 2 MG/ML IV SOLN
INTRAVENOUS | Status: DC | PRN
  Administered 2022-09-16: 10 mg via INTRAVENOUS

## 2022-09-16 MED ORDER — ORAL CARE MOUTH RINSE: 15.0000 mL | Freq: Once | OROMUCOSAL | Status: AC

## 2022-09-16 MED ORDER — VANCOMYCIN HCL IN DEXTROSE 1-5 GM/200ML-% IV SOLN
INTRAVENOUS | Status: AC
  Administered 2022-09-16: 1000 mg via INTRAVENOUS
  Filled 2022-09-16: qty 200

## 2022-09-16 MED ORDER — BACITRACIN ZINC 500 UNIT/GM EX OINT
TOPICAL_OINTMENT | CUTANEOUS | Status: DC | PRN
  Administered 2022-09-16: 1 via TOPICAL

## 2022-09-16 MED ORDER — VANCOMYCIN HCL 2000 MG/400ML IV SOLN
2000.0000 mg | Freq: Once | INTRAVENOUS | Status: AC
  Administered 2022-09-16: 2000 mg via INTRAVENOUS
  Filled 2022-09-16: qty 400

## 2022-09-16 SURGICAL SUPPLY — 57 items
BAG COUNTER SPONGE SURGICOUNT (BAG) ×1 IMPLANT
BANDAGE ESMARK 6X9 LF (GAUZE/BANDAGES/DRESSINGS) ×1 IMPLANT
BLADE SAW GIGLI 510 (BLADE) ×1 IMPLANT
BNDG COHESIVE 6X5 TAN NS LF (GAUZE/BANDAGES/DRESSINGS) IMPLANT
BNDG COHESIVE 6X5 TAN STRL LF (GAUZE/BANDAGES/DRESSINGS) ×1 IMPLANT
BNDG ELASTIC 4X5.8 VLCR STR LF (GAUZE/BANDAGES/DRESSINGS) ×1 IMPLANT
BNDG ELASTIC 6X5.8 VLCR STR LF (GAUZE/BANDAGES/DRESSINGS) ×1 IMPLANT
BNDG ESMARK 6X9 LF (GAUZE/BANDAGES/DRESSINGS) ×1
BNDG GAUZE DERMACEA FLUFF 4 (GAUZE/BANDAGES/DRESSINGS) ×1 IMPLANT
CANISTER SUCT 3000ML PPV (MISCELLANEOUS) ×1 IMPLANT
CLIP LIGATING EXTRA MED SLVR (CLIP) ×1 IMPLANT
CLIP LIGATING EXTRA SM BLUE (MISCELLANEOUS) ×1 IMPLANT
COVER MAYO STAND STRL (DRAPES) IMPLANT
COVER SURGICAL LIGHT HANDLE (MISCELLANEOUS) ×1 IMPLANT
CUFF TOURN SGL QUICK 24 (TOURNIQUET CUFF)
CUFF TOURN SGL QUICK 34 (TOURNIQUET CUFF)
CUFF TRNQT CYL 24X4X16.5-23 (TOURNIQUET CUFF) IMPLANT
CUFF TRNQT CYL 34X4.125X (TOURNIQUET CUFF) IMPLANT
DRAIN CHANNEL 19F RND (DRAIN) IMPLANT
DRAPE DERMATAC (DRAPES) IMPLANT
DRAPE HALF SHEET 40X57 (DRAPES) ×1 IMPLANT
DRAPE INCISE IOBAN 66X45 STRL (DRAPES) IMPLANT
DRAPE ORTHO SPLIT 77X108 STRL (DRAPES) ×2
DRAPE SURG ORHT 6 SPLT 77X108 (DRAPES) ×2 IMPLANT
DRESSING PREVENA PLUS CUSTOM (GAUZE/BANDAGES/DRESSINGS) IMPLANT
DRSG ADAPTIC 3X8 NADH LF (GAUZE/BANDAGES/DRESSINGS) ×1 IMPLANT
DRSG PREVENA PLUS CUSTOM (GAUZE/BANDAGES/DRESSINGS)
ELECT CAUTERY BLADE 6.4 (BLADE) ×1 IMPLANT
ELECT REM PT RETURN 9FT ADLT (ELECTROSURGICAL) ×1
ELECTRODE REM PT RTRN 9FT ADLT (ELECTROSURGICAL) ×1 IMPLANT
EVACUATOR SILICONE 100CC (DRAIN) IMPLANT
GAUZE SPONGE 4X4 12PLY STRL (GAUZE/BANDAGES/DRESSINGS) ×1 IMPLANT
GLOVE BIO SURGEON STRL SZ7.5 (GLOVE) ×1 IMPLANT
GOWN STRL REUS W/ TWL LRG LVL3 (GOWN DISPOSABLE) ×2 IMPLANT
GOWN STRL REUS W/ TWL XL LVL3 (GOWN DISPOSABLE) ×1 IMPLANT
GOWN STRL REUS W/TWL LRG LVL3 (GOWN DISPOSABLE) ×2
GOWN STRL REUS W/TWL XL LVL3 (GOWN DISPOSABLE) ×1
KIT BASIN OR (CUSTOM PROCEDURE TRAY) ×1 IMPLANT
KIT TURNOVER KIT B (KITS) ×1 IMPLANT
NS IRRIG 1000ML POUR BTL (IV SOLUTION) ×1 IMPLANT
PACK GENERAL/GYN (CUSTOM PROCEDURE TRAY) ×1 IMPLANT
PAD ARMBOARD 7.5X6 YLW CONV (MISCELLANEOUS) ×2 IMPLANT
POWDER SURGICEL 3.0 GRAM (HEMOSTASIS) IMPLANT
PREVENA RESTOR ARTHOFORM 46X30 (CANNISTER) IMPLANT
STAPLER VISISTAT 35W (STAPLE) ×1 IMPLANT
STOCKINETTE IMPERVIOUS LG (DRAPES) ×1 IMPLANT
SUT ETHILON 3 0 PS 1 (SUTURE) IMPLANT
SUT SILK 0 TIES 10X30 (SUTURE) ×1 IMPLANT
SUT SILK 2 0 (SUTURE) ×1
SUT SILK 2 0SH CR/8 30 (SUTURE) IMPLANT
SUT SILK 2-0 18XBRD TIE 12 (SUTURE) ×1 IMPLANT
SUT SILK 3 0 (SUTURE) ×1
SUT SILK 3-0 18XBRD TIE 12 (SUTURE) IMPLANT
SUT VIC AB 2-0 CT1 18 (SUTURE) ×2 IMPLANT
TOWEL GREEN STERILE (TOWEL DISPOSABLE) ×2 IMPLANT
UNDERPAD 30X36 HEAVY ABSORB (UNDERPADS AND DIAPERS) ×1 IMPLANT
WATER STERILE IRR 1000ML POUR (IV SOLUTION) ×1 IMPLANT

## 2022-09-16 NOTE — Progress Notes (Signed)
Pharmacy Antibiotic Note  Christian Dalton is a 81 y.o. male admitted on 09/15/2022 with cellulitis.  Pharmacy has been consulted for vancomycin dosing.  Patient presents with worsening bilateral feet pain. Planned to have AKA on 10/27. Now presumed to be septic. HD schedule PTA was TTS. [place] had HD yesterday however did not complete full run due to hypotension. Patient was NOT given 2g IV vancomycin based on hemodialysis nursing documentation (although charted in [facility] was given).   Plan: Since patient did not receive load of vancomycin on 10/23 will re-time for now.  Currently scheduled HD supplement for TTS, will follow up with nephrology plans for next HD session.  Follow culture data for de-escalation.  Monitor s/sx of infection, CBC, and follow-up duration of treatment based upon surgical plans.   Temp (24hrs), Avg:98.3 F (36.8 C), Min:97.8 F (36.6 C), Max:98.7 F (37.1 C)  Recent Labs  Lab 09/09/22 1550 09/15/22 1152 09/15/22 1155 09/15/22 1440  WBC 19.1*  --  21.7*  --   CREATININE 8.26*  --  7.67*  --   LATICACIDVEN 1.2 1.7  --  1.4     Estimated Creatinine Clearance: 8.9 mL/min (A) (by C-G formula based on SCr of 7.67 mg/dL (H)).    Allergies  Allergen Reactions   Avelox [Moxifloxacin Hcl] Other (See Comments)    Hallucinations    Gabapentin Other (See Comments)    Caused staggering and red feet   Metoprolol Other (See Comments)    Dropped pulse too low; was taken off of this by MD   Quinolones Other (See Comments)    unknown   Shellfish Allergy Nausea And Vomiting and Other (See Comments)    Made patient VERY nauseous and he developed stomach cramps   Sulfa Antibiotics Other (See Comments)    Allergy is from childhood (reaction not recalled)   Tetracyclines & Related Other (See Comments)    Blisters on hands and arms   Adhesive [Tape] Itching and Rash    Paper tape only- skin cannot tolerate "heavy" tapes   Thank you for allowing pharmacy to be a part of  this patient's care.  Ventura Sellers, PharmD  09/16/2022 7:34 AM

## 2022-09-16 NOTE — Progress Notes (Signed)
   09/15/22 2100  Vitals  Temp 98.7 F (37.1 C)  BP (!) 83/49  MAP (mmHg) (!) 47  Pulse Rate 68  Resp 18  Post Treatment  Dialyzer Clearance Heavily streaked  Duration of HD Treatment -hour(s) 2.5 hour(s)  Liters Processed 54.1  Fluid Removed -1.1 mL  Tolerated HD Treatment No (Comment)  Post-Hemodialysis Comments No reliable BP, PT LT foot only, due to skin tear on RTarm,IV's, and avf of LT. NON S&S of low bp all the while. Floor MD came to check for low bp's and new set up for BP cuff. TX needed TMP cartidge change, and one more cartidge change due to failure. PT call off TX due to pain of foot, and didn't get his Vanco 2 Grams. AMA used.  AVG/AVF Arterial Site Held (minutes) 10 minutes  AVG/AVF Venous Site Held (minutes) 10 minutes   TX cut short, due to call off AMA by PT, duet to not given pain meds, due to bp too low, due to no reliable spots for bp. Vanco. Not given.

## 2022-09-16 NOTE — Anesthesia Procedure Notes (Signed)
Procedure Name: Intubation Date/Time: 09/16/2022 1:32 PM  Performed by: Amadeo Garnet, CRNAPre-anesthesia Checklist: Patient identified, Emergency Drugs available, Suction available and Patient being monitored Patient Re-evaluated:Patient Re-evaluated prior to induction Oxygen Delivery Method: Circle system utilized Preoxygenation: Pre-oxygenation with 100% oxygen Induction Type: IV induction Ventilation: Mask ventilation without difficulty and Oral airway inserted - appropriate to patient size Laryngoscope Size: Mac and 4 Grade View: Grade I Tube type: Oral Tube size: 7.5 mm Number of attempts: 1 Airway Equipment and Method: Stylet and Oral airway Placement Confirmation: ETT inserted through vocal cords under direct vision, positive ETCO2 and breath sounds checked- equal and bilateral Secured at: 22 cm Tube secured with: Tape Dental Injury: Teeth and Oropharynx as per pre-operative assessment

## 2022-09-16 NOTE — Progress Notes (Addendum)
[facility] Progress Note   Subjective:   Patient seen and examined at bedside in room.  Reports dialysis yesterday was terrible, "I was there 6 hours and only had 3 hours of treatment.  Next time if that happens I will crawl out of there."  Continues to have pain in LE. Reports he is thinking a lot about his upcoming surgery, trying to process what is to come next.  Provided support.  Denies CP, SOB, abdominal pain and n/v/d.   Objective Vitals:   09/16/22 0600 09/16/22 0652 09/16/22 0851 09/16/22 0900  BP: (!) 117/59   118/65  Pulse: 83  82 81  Resp: '15  17 18  '$ Temp:  98.7 F (37.1 C) 98.8 F (37.1 C)   TempSrc:  Oral Oral   SpO2: 98%  96% 97%   Physical Exam General:chronically ill appearing male in NAD Heart:RRR, no mrg Lungs:mostly CTAB, nml WOB on RA Abdomen:soft, NTND Extremities:2+ UE edema, Bilateral lower extremities with 1-2+ edema. Malodorous R LE with black gangrenous phalanges and erythematous forefoot and midfoot on ventral side with necrosis extending up to distal heel on plantar surface. L LE with first and second phalange necrosis with forefoot and midfoot erythema. See Vascular note for pictures Dialysis Access: LU AVF +b/t - 3 ulcers w/surrounding erythema over AVF   Filed Weights    Intake/Output Summary (Last 24 hours) at 09/16/2022 1033 Last data filed at 09/15/2022 2100 Gross per 24 hour  Intake --  Output -1.1 ml  Net 1.1 ml    Additional Objective Labs: Basic Metabolic Panel: Recent Labs  Lab 09/09/22 1550 09/15/22 1155  NA 134* 134*  K 5.9* 6.6*  CL 93* 95*  CO2 25 22  GLUCOSE 117* 98  BUN 64* 61*  CREATININE 8.26* 7.67*  CALCIUM 10.0 9.4   Liver Function Tests: Recent Labs  Lab 09/09/22 1550 09/15/22 1155  AST 8* 9*  ALT 8 8  ALKPHOS 190* 231*  BILITOT 0.5 0.6  PROT 6.0* 5.4*  ALBUMIN 2.5* 2.1*   CBC: Recent Labs  Lab 09/09/22 1550 09/15/22 1155  WBC 19.1* 21.7*  NEUTROABS 17.1* 19.5*  HGB 11.8* 11.2*   HCT 38.9* 36.0*  MCV 89.6 88.5  PLT 411* 303   Blood Culture    Component Value Date/Time   SDES BLOOD SITE NOT SPECIFIED 09/15/2022 1510   SPECREQUEST  09/15/2022 1510    BOTTLES DRAWN AEROBIC AND ANAEROBIC Blood Culture results may not be optimal due to an excessive volume of blood received in culture bottles   CULT  09/15/2022 1510    NO GROWTH < 24 HOURS Performed at [facility], [place] [address], [place], [place] [address]    REPTSTATUS PENDING 09/15/2022 1510    Studies/Results: DG Foot Complete Right  Result Date: 09/15/2022 CLINICAL DATA:  Soft tissue infection EXAM: RIGHT FOOT COMPLETE - 3+ VIEW COMPARISON:  07/10/2022 FINDINGS: No fracture or dislocation is seen. There are no focal lytic lesions. Extensive arterial calcifications are seen in soft tissues. No significant interval changes are noted. IMPRESSION: No fracture or dislocation is seen. There are no focal lytic lesions in right foot. Electronically Signed   By: Elmer Picker M.D.   On: 09/15/2022 12:56   DG Foot Complete Left  Result Date: 09/15/2022 CLINICAL DATA:  Pain, soft tissue infection EXAM: LEFT FOOT - COMPLETE 3+ VIEW COMPARISON:  07/10/2022 FINDINGS: No fracture or dislocation is seen. There are no focal lytic lesions. Extensive arterial calcifications are seen in  soft tissues. There is subcutaneous linear calcification posterior to the calcaneus with no significant change. IMPRESSION: No fracture or dislocation is seen. There are no focal lytic lesions in the left foot. Electronically Signed   By: Elmer Picker M.D.   On: 09/15/2022 12:55    Medications:  vancomycin     vancomycin 2,000 mg (09/16/22 0850)    atorvastatin  10 mg Oral Daily   buPROPion  150 mg Oral Daily   Chlorhexidine Gluconate Cloth  6 each Topical Q0600   escitalopram  10 mg Oral QHS    HYDROmorphone (DILAUDID) injection  0.5 mg Intravenous Once   levothyroxine  125 mcg Oral Q0600   linaclotide  290 mcg Oral QPM    midodrine  10 mg Oral TID WC   montelukast  10 mg Oral QHS   sevelamer carbonate  800 mg Oral BID WC    Dialysis Orders: TTS - [facility]  3 hrs 45 min, BFR 425, DFR 800,  EDW 88.9 kg, 2 K/ 2 Ca   Access: LUE AVF  No Heparin  Mircera 50 mcg q2wks - last 10/19 Calcitriol 1.75 mcg PO qHD Venofer qweek      Assessment/Plan:  Gangrenous LE Wound infection - Bilateral lower extremity gangrenous wounds. Plan for R AKA today by Dr. Donzetta Matters.  Hyperkalemia -  K 6.6. No acute changes on EKG.  3hrs of HD completed yesterday using low K bath.  Chronic ulcer on AVF - Worsening.  No spontaneous bleeding. Stick away from and have VVS eval.   ESRD on HD TTS - HD yesterday off schedule.  Will likely resume regular schedule Thursday unless urgent indication prior.   Hypertension/volume  - BP low. Volume overloaded on exam but UF limited by hypotension.  Continue to hold home meds (lisinopril 2.'5mg'$  qd, carvedilol 3.'125mg'$  qd).  Given albumin and midodrine with HD.  Now on midodrine '10mg'$  TID.  Continue UF as tolerated.   Anemia of CKD - Hgb 11.2. Receives venofer every week - hold d/t infection. Continue to monitor. Transfuse for Hgb <7. No indication for ESA at this time.  Expect drop in Hgb post surgery.   Secondary Hyperparathyroidism -  Ca 9.4. Check Phos. Continue VDRA and binders when eating -  Renvela 3AC TID.  Nutrition - NPO for surgery, will need renal diet with fluid restriction.  A fib DM - per PMD  Jen Mow, PA-C [facility] 09/16/2022,10:33 AM  LOS: 1 day   Seen and examined independently.  Agree with note and exam as documented above by physician extender and as noted here.  General adult male in bed in no acute distress HEENT normocephalic atraumatic extraocular movements intact sclera anicteric Neck supple trachea midline Lungs clear to auscultation bilaterally normal work of breathing at rest  Heart S1S2 no rub Abdomen soft nontender nondistended Extremities 1+  edema left leg; gangrenous toes; right AKA Psych normal mood and affect  Gangrenous lower extremity wounds - s/p right AKA on 10/24   Hyperkalemia - resolved after HD; if recurs will need HD again off schedule     Chronic ulcer on AVF - as above - stick away and appreciate VVS assistance   ESRD - HD per TTS schedule (next HD on 10/26 unless needed urgently for hyperkalemia before that)   Hypotension - home meds are off.  Limited UF due to the hypotension. On midodrine (new)   Anemia CKD - no ESA indicated at this time    Secondary hyperparathyroidism - continue  outpatient regimen when eating  Disposition - anticipate some type of rehab  Claudia Desanctis, MD 09/16/2022  6:32 PM

## 2022-09-16 NOTE — Progress Notes (Signed)
  Progress Note    09/16/2022 12:59 PM Day of Surgery  Subjective:  no overnight issues  Vitals:   09/16/22 1226 09/16/22 1241  BP: (!) 127/51   Pulse: 79   Resp: 16   Temp:  98.8 F (37.1 C)  SpO2: 99%     Physical Exam: Necrosis right foot, dry gangrene left 1st/2nd toe  CBC    Component Value Date/Time   WBC 21.7 (H) 09/15/2022 1155   RBC 4.07 (L) 09/15/2022 1155   HGB 11.2 (L) 09/15/2022 1155   HCT 36.0 (L) 09/15/2022 1155   PLT 303 09/15/2022 1155   MCV 88.5 09/15/2022 1155   MCH 27.5 09/15/2022 1155   MCHC 31.1 09/15/2022 1155   RDW 17.5 (H) 09/15/2022 1155   LYMPHSABS 0.8 09/15/2022 1155   MONOABS 1.0 09/15/2022 1155   EOSABS 0.2 09/15/2022 1155   BASOSABS 0.1 09/15/2022 1155    BMET    Component Value Date/Time   NA 134 (L) 09/15/2022 1155   K 6.6 (HH) 09/15/2022 1155   CL 95 (L) 09/15/2022 1155   CO2 22 09/15/2022 1155   GLUCOSE 98 09/15/2022 1155   BUN 61 (H) 09/15/2022 1155   CREATININE 7.67 (H) 09/15/2022 1155   CALCIUM 9.4 09/15/2022 1155   GFRNONAA 7 (L) 09/15/2022 1155   GFRAA 7 (L) 10/26/2019 1626    INR    Component Value Date/Time   INR 1.2 09/09/2022 1550     Intake/Output Summary (Last 24 hours) at 09/16/2022 1259 Last data filed at 09/15/2022 2100 Gross per 24 hour  Intake --  Output -1.1 ml  Net 1.1 ml     Assessment:  81 y.o. male is here with necrotic right foot.  Plan: R AKA today in OR.    Lunah Losasso C. Donzetta Matters, MD [facility] Office: [phone] Pager: [phone]  09/16/2022 12:59 PM

## 2022-09-16 NOTE — ED Notes (Signed)
Checked patient cbg it was 33 notified RN Lauren of blood sugar patient is resting with family at bedside and call bell in reach

## 2022-09-16 NOTE — Progress Notes (Addendum)
   Subjective:  The patient has no new complaints or concerns today. Denies chest pain and shortness of breath. He endorses understanding of his upcoming right AKA procedure this afternoon.   Objective:  Vital signs in last 24 hours: Vitals:   09/16/22 0900 09/16/22 1226 09/16/22 1241 09/16/22 1301  BP: 118/65 (!) 127/51  126/66  Pulse: 81 79  79  Resp: '18 16  18  '$ Temp:   98.8 F (37.1 C) 98.1 F (36.7 C)  TempSrc:   Oral Oral  SpO2: 97% 99%  98%   Weight change:   Intake/Output Summary (Last 24 hours) at 09/16/2022 1321 Last data filed at 09/15/2022 2100 Gross per 24 hour  Intake --  Output -1.1 ml  Net 1.1 ml   General: Resting comfortably; No acute distress. Cardiovascular: Regular rate and rhythm; No murmurs. Pulmonary: Lungs clear to auscultation bilaterally; No wheezes, rales, or crackles. Extremities: LUE AVF intact.  Skin: *See images in H&P*    Assessment/Plan:  Principal Problem:   Gangrene of both feet ([place]) Active Problems:   ESRD (end stage renal disease) on dialysis ([place])   Type II diabetes mellitus with renal manifestations (HCC)   HTN (hypertension)   HLD (hyperlipidemia)   COPD (chronic obstructive pulmonary disease) (HCC)   HFrEF (heart failure with reduced ejection fraction) (HCC)  Christian Dalton is a 81 y.o. male with past medical history of ESRD on TTS HD, T2DM, HTN, CAD, HLD, hypothyroidism, depression, COPD, HFrEF, and peripheral artery disease presenting with worsening bilateral foot ischemic necrosis and scheduled for R AKA today, 10/24.   #Bilateral foot ischemic necrosis  The patient is scheduled for right AKA today, 10/24, at 2 pm. Will follow VVS recommendations.  - NPO - Dilaudid 0.5 q4 PRN - Vancomycin for associated skin and soft tissue infection - Trend CBC, f/u blood cultures  - Medical management of ischemic vascular disease with aspirin '81mg'$  and atorvastatin '80mg'$  daily  #ESRD on hemodialysis  The patient's blood pressure has  normalized to 117/59 since dialysis. Will continue to monitor. - Midodrine 10 TID with meals  - Renvela 800 BID with meals   #T2DM A1c 6.0 earlier this month.  -SSI q4 post op   #HTN #CAD #HLD #HFrEF Assess while in HD unit. hypervolemicOn lisinopril 2.'5mg'$  daily and coreg 3.125 BID, holding given low BP at HD. Continue lipitor '10mg'$ .   #COPD Respiratory status stable on RA in HD.  -continue singulair '10mg'$  QHS, albuterol nebs q6h prn   #Depression Continue welbutrin 150 mg daily, lexapro 10 mg QHS   #Hypothyroidism Continue levothyroxine 125 mcg daily  Heparin TID for VTE prophylaxis   LOS: 1 day   Christian Dalton, Medical Student 09/16/2022, 1:21 PM

## 2022-09-16 NOTE — Transfer of Care (Signed)
Immediate Anesthesia Transfer of Care Note  Patient: Jermone Clagett  Procedure(s) Performed: RIGHT ABOVE KNEE AMPUTATION (Right: Knee)  Patient Location: PACU  Anesthesia Type:General  Level of Consciousness: awake, alert  and oriented  Airway & Oxygen Therapy: Patient Spontanous Breathing and Patient connected to face mask oxygen  Post-op Assessment: Report given to RN and Post -op Vital signs reviewed and stable  Post vital signs: Reviewed and stable  Last Vitals:  Vitals Value Taken Time  BP 105/55 09/16/22 1450  Temp    Pulse 71 09/16/22 1454  Resp 0 09/16/22 1454  SpO2 93 % 09/16/22 1454  Vitals shown include unvalidated device data.  Last Pain:  Vitals:   09/16/22 1301  TempSrc: Oral  PainSc: 7       Patients Stated Pain Goal: 3 (41/36/43 8377)  Complications: No notable events documented.

## 2022-09-16 NOTE — Anesthesia Postprocedure Evaluation (Signed)
Anesthesia Post Note  Patient: Cedrick Segar  Procedure(s) Performed: RIGHT ABOVE KNEE AMPUTATION (Right: Knee)     Patient location during evaluation: PACU Anesthesia Type: General Level of consciousness: awake and alert Pain management: pain level controlled Vital Signs Assessment: post-procedure vital signs reviewed and stable Respiratory status: spontaneous breathing, nonlabored ventilation, respiratory function stable and patient connected to nasal cannula oxygen Cardiovascular status: blood pressure returned to baseline and stable Postop Assessment: no apparent nausea or vomiting Anesthetic complications: no   No notable events documented.  Last Vitals:  Vitals:   09/16/22 1530 09/16/22 1550  BP: (!) 122/55 118/63  Pulse: 75 74  Resp: 14 20  Temp:  36.7 C  SpO2: 94% 97%    Last Pain:  Vitals:   09/16/22 1550  TempSrc: Oral  PainSc:                  Effie Berkshire

## 2022-09-16 NOTE — Op Note (Signed)
    Patient name: Christian Dalton MRN: [id number] DOB: 22-Aug-1941 Sex: male  09/16/2022 Pre-operative Diagnosis: necrotic right foot Post-operative diagnosis:  Same Surgeon:  Erlene Quan C. Donzetta Matters, MD Assistant: Leontine Locket, PA Procedure Performed:  Right above-knee amputation  Indications: 81 year old male with end-stage renal disease now has necrosis of most of his right foot is indicated for above-knee amputation.  Findings: All muscle appeared healthy there was good capillary bleeding.   Procedure:  The patient was identified in the holding area and taken to the operating was placed upon operative when general anesthesia induced.  He was sterilely prepped draped in the right lower extremity usual fashion, antibiotics were minister timeout was called.  We began with fishmouth type incision above the knee.  We dissected down through the muscle and the fascia.  Blood vessels were clear proximally distally transected.  The bone was transected initially with Gigli and a posterior flap was created with amputation knife.  I then elected to transect the bone 2 inches higher using power saw.  This was then smoothed with wraps.  Blood vessels were suture-ligated with 2-0 Vicryl stitches.  The nerve was then pulled on tension and tied off with Vicryl tie and divided.  The wound was irrigated hemostasis obtained anterior posterior flaps were reapproximated with interrupted 2-0 Vicryl suture.  Skin was closed with staples.  A sterile dressing was applied.  He was awakened from anesthesia having tolerated procedure without any complication.  Given the complexity of the case,  the assistant was necessary in order to expedient the procedure and safely perform the technical aspects of the operation.  The assistant provided traction and countertraction and to assist with bone transection and reapproximation of anterior and posterior muscle flaps.  EBL: 100cc  Geral Tuch C. Donzetta Matters, MD [facility]  [place] Office: [phone] Pager: [phone]

## 2022-09-16 NOTE — Anesthesia Procedure Notes (Signed)
Arterial Line Insertion Start/End10/24/2023 12:45 PM, 09/16/2022 12:50 PM Performed by: Amadeo Garnet, CRNA, CRNA  Patient location: Pre-op. Preanesthetic checklist: patient identified, IV checked, site marked, risks and benefits discussed, surgical consent, monitors and equipment checked, pre-op evaluation, timeout performed and anesthesia consent Lidocaine 1% used for infiltration Right, radial was placed Catheter size: 20 G Hand hygiene performed  and maximum sterile barriers used   Attempts: 1 Procedure performed without using ultrasound guided technique. Following insertion, dressing applied and Biopatch. Post procedure assessment: normal  Patient tolerated the procedure well with no immediate complications.

## 2022-09-16 NOTE — ED Notes (Signed)
Help get patient sitting up in the bed patient has call bell in reach

## 2022-09-16 NOTE — ED Notes (Addendum)
Pt in room sleeping. NAD chest rise and falling. Pt hard of hearing. A&o X3. R foot with necrotic toes and L foot with partial necrotic toes. Updated on plan of care.

## 2022-09-16 NOTE — Progress Notes (Signed)
Notified by RN patient with hypotension, MAP's in the 31's at 2000. Presented to HD at this time, patient mentating well and in no acute distress. Resting comfortably in bed and just completed dialysis. Received first dose of midodrine at 1900.   Blood pressure cuff located on RLE, unclear where cuff placed when MAP's in 80's. BP cuff moved to RUE in ED with significant improvement in BP and MAP's in 70's. Continue midodrine and monitor for hypotension.

## 2022-09-17 ENCOUNTER — Other Ambulatory Visit: Payer: Self-pay

## 2022-09-17 ENCOUNTER — Encounter (HOSPITAL_COMMUNITY): Payer: Self-pay | Admitting: Vascular Surgery

## 2022-09-17 DIAGNOSIS — I96 Gangrene, not elsewhere classified: Secondary | ICD-10-CM | POA: Diagnosis not present

## 2022-09-17 LAB — PHOSPHORUS: Phosphorus: 4.9 mg/dL — ABNORMAL HIGH (ref 2.5–4.6)

## 2022-09-17 LAB — BASIC METABOLIC PANEL
Anion gap: 13 (ref 5–15)
BUN: 48 mg/dL — ABNORMAL HIGH (ref 8–23)
CO2: 26 mmol/L (ref 22–32)
Calcium: 9.2 mg/dL (ref 8.9–10.3)
Chloride: 94 mmol/L — ABNORMAL LOW (ref 98–111)
Creatinine, Ser: 6.22 mg/dL — ABNORMAL HIGH (ref 0.61–1.24)
GFR, Estimated: 9 mL/min — ABNORMAL LOW (ref >60)
Glucose, Bld: 75 mg/dL (ref 70–99)
Potassium: 4.8 mmol/L (ref 3.5–5.1)
Sodium: 133 mmol/L — ABNORMAL LOW (ref 135–145)

## 2022-09-17 LAB — CBC
HCT: 31.2 % — ABNORMAL LOW (ref 39.0–52.0)
Hemoglobin: 9.9 g/dL — ABNORMAL LOW (ref 13.0–17.0)
MCH: 27.1 pg (ref 26.0–34.0)
MCHC: 31.7 g/dL (ref 30.0–36.0)
MCV: 85.5 fL (ref 80.0–100.0)
Platelets: 318 10*3/uL (ref 150–400)
RBC: 3.65 MIL/uL — ABNORMAL LOW (ref 4.22–5.81)
RDW: 17.6 % — ABNORMAL HIGH (ref 11.5–15.5)
WBC: 20.7 10*3/uL — ABNORMAL HIGH (ref 4.0–10.5)
nRBC: 0 % (ref 0.0–0.2)

## 2022-09-17 MED ORDER — CHLORHEXIDINE GLUCONATE CLOTH 2 % EX PADS
6.0000 | MEDICATED_PAD | Freq: Every day | CUTANEOUS | Status: DC
  Administered 2022-09-17 – 2022-09-29 (×13): 6 via TOPICAL

## 2022-09-17 MED ORDER — MIDODRINE HCL 5 MG PO TABS
10.0000 mg | ORAL_TABLET | ORAL | Status: DC
  Administered 2022-09-18 – 2022-09-27 (×5): 10 mg via ORAL
  Filled 2022-09-17 (×5): qty 2

## 2022-09-17 MED ORDER — OXYCODONE-ACETAMINOPHEN 5-325 MG PO TABS
2.0000 | ORAL_TABLET | ORAL | Status: DC | PRN
  Administered 2022-09-17 – 2022-09-18 (×3): 2 via ORAL
  Filled 2022-09-17 (×3): qty 2

## 2022-09-17 MED ORDER — SEVELAMER CARBONATE 800 MG PO TABS
2400.0000 mg | ORAL_TABLET | Freq: Two times a day (BID) | ORAL | Status: DC
  Administered 2022-09-17 – 2022-09-29 (×11): 2400 mg via ORAL
  Filled 2022-09-17 (×17): qty 3

## 2022-09-17 MED ORDER — CALCITRIOL 0.25 MCG PO CAPS
1.7500 ug | ORAL_CAPSULE | ORAL | Status: DC
  Administered 2022-09-20 – 2022-09-23 (×2): 1.75 ug via ORAL
  Filled 2022-09-17 (×4): qty 7

## 2022-09-17 MED ORDER — HYDROMORPHONE HCL 1 MG/ML IJ SOLN
1.0000 mg | INTRAMUSCULAR | Status: DC | PRN
  Administered 2022-09-17 – 2022-09-19 (×6): 1 mg via INTRAVENOUS
  Filled 2022-09-17 (×7): qty 1

## 2022-09-17 MED ORDER — LACTULOSE 10 GM/15ML PO SOLN
10.0000 g | Freq: Every evening | ORAL | Status: DC
  Administered 2022-09-17: 10 g via ORAL
  Filled 2022-09-17: qty 15

## 2022-09-17 NOTE — Evaluation (Signed)
Physical Therapy Evaluation Patient Details Name: Christian Dalton MRN: [id number] DOB: 1941/02/06 Today's Date: 09/17/2022  History of Present Illness  Pt is a 81 y/o male presenting on 10/23 with worsening bilateral foot gangrene. Scheduled R AKA 10/24. PMH includes: ESRD on TTS HD, DM2, HTN, CAD, depression, COPD, PAD.  Clinical Impression  PTA, pt lives with his spouse in a level entry apartment. In the past 3 months, pt has required assist for stand pivot transfers and ADL's. He can propel w/c household distances. Pt presents with decreased functional mobility secondary to right residual limb pain, generalized weakness, impaired sitting balance and decreased skin integrity. Pt requiring two person moderate assist for bed mobility. Simulated lateral scoot transfers edge of bed. Will plan to trial transfer out of bed next session using slide board.      Recommendations for follow up therapy are one component of a multi-disciplinary discharge planning process, led by the attending physician.  Recommendations may be updated based on patient status, additional functional criteria and insurance authorization.  Follow Up Recommendations Acute inpatient rehab (3hours/day)      Assistance Recommended at Discharge Frequent or constant Supervision/Assistance  Patient can return home with the following  Two people to help with walking and/or transfers;A lot of help with bathing/dressing/bathroom;Assistance with cooking/housework;Assist for transportation;Help with stairs or ramp for entrance    Equipment Recommendations Other (comment) (TBA)  Recommendations for Other Services  Rehab consult    Functional Status Assessment Patient has had a recent decline in their functional status and demonstrates the ability to make significant improvements in function in a reasonable and predictable amount of time.     Precautions / Restrictions Precautions Precautions: Fall Restrictions Weight Bearing  Restrictions: No      Mobility  Bed Mobility Overal bed mobility: Needs Assistance Bed Mobility: Supine to Sit, Sit to Supine, Rolling Rolling: Mod assist   Supine to sit: Mod assist, +2 for physical assistance Sit to supine: Mod assist, +2 for physical assistance   General bed mobility comments: ModA + 2 for supine <> sit, use of bed pad to scoot hips and cues for use of railing to sit trunk up with additional assist required. Rolling to R/L for re-adjustment of pad    Transfers Overall transfer level: Needs assistance   Transfers: Bed to chair/wheelchair/BSC            Lateral/Scoot Transfers: Max assist, +2 physical assistance General transfer comment: Simulated lateral scoot transfer towards right with verbal/visual demonstration. MaxA + 2 with use of bed pad to scoot up x 4    Ambulation/Gait                  Stairs            Wheelchair Mobility    Modified Rankin (Stroke Patients Only)       Balance Overall balance assessment: Needs assistance Sitting-balance support: Feet unsupported, No upper extremity supported Sitting balance-Leahy Scale: Fair Sitting balance - Comments: able to reach with BUE's dynamically with supervision. one LOB which pt was able to perform righting reaction to recover                                     Pertinent Vitals/Pain Pain Assessment Pain Assessment: Faces Faces Pain Scale: Hurts even more Pain Location: R residual limb Pain Descriptors / Indicators: Grimacing, Guarding Pain Intervention(s): Monitored during session, Limited activity within patient's  tolerance    Home Living Family/patient expects to be discharged to:: Private residence Living Arrangements: Spouse/significant other Available Help at Discharge: Family;Available 24 hours/day Type of Home: Apartment Home Access: Level entry       Home Layout: One level Home Equipment: Conservation officer, nature (2 wheels);Rollator (4 wheels);Cane -  single point;Wheelchair - Architect      Prior Function Prior Level of Function : Needs assist             Mobility Comments: using BSC, limited ambulation after fall 3 months ago. Pt wife helping with SPT. transport chair for HD. spends non HD days in w/c, HD days typically is in bed due to fatigue ADLs Comments: assist for ADL's, pt wife washing him up due to fear of falling with shower transfer     Hand Dominance   Dominant Hand: Right    Extremity/Trunk Assessment   Upper Extremity Assessment Upper Extremity Assessment: Defer to OT evaluation    Lower Extremity Assessment Lower Extremity Assessment: RLE deficits/detail;LLE deficits/detail RLE Deficits / Details: s/p AKA, grossly 2-/5 LLE Deficits / Details: Necrotis 1st, 2nd, 5th toes. Grossly 3/5       Communication   Communication: HOH  Cognition Arousal/Alertness: Awake/alert Behavior During Therapy: WFL for tasks assessed/performed Overall Cognitive Status: Impaired/Different from baseline Area of Impairment: Following commands, Awareness, Problem solving                       Following Commands: Follows one step commands with increased time   Awareness: Emergent Problem Solving: Slow processing, Decreased initiation, Difficulty sequencing, Requires verbal cues General Comments: Pt A&Ox3. Upon entering room, pt spouse feeding pt. Encouraged pt to        General Comments      Exercises     Assessment/Plan    PT Assessment Patient needs continued PT services  PT Problem List Decreased strength;Decreased activity tolerance;Decreased balance;Decreased mobility;Impaired sensation;Decreased skin integrity;Pain       PT Treatment Interventions Functional mobility training;Therapeutic activities;Therapeutic exercise;Balance training;Patient/family education    PT Goals (Current goals can be found in the Care Plan section)  Acute Rehab PT Goals Patient Stated Goal: to go to  rehab PT Goal Formulation: With patient/family Time For Goal Achievement: 10/01/22 Potential to Achieve Goals: Fair    Frequency Min 3X/week     Co-evaluation PT/OT/SLP Co-Evaluation/Treatment: Yes Reason for Co-Treatment: For patient/therapist safety;To address functional/ADL transfers PT goals addressed during session: Mobility/safety with mobility         AM-PAC PT "6 Clicks" Mobility  Outcome Measure Help needed turning from your back to your side while in a flat bed without using bedrails?: A Lot Help needed moving from lying on your back to sitting on the side of a flat bed without using bedrails?: A Lot Help needed moving to and from a bed to a chair (including a wheelchair)?: Total Help needed standing up from a chair using your arms (e.g., wheelchair or bedside chair)?: Total Help needed to walk in hospital room?: Total Help needed climbing 3-5 steps with a railing? : Total 6 Click Score: 8    End of Session Equipment Utilized During Treatment: Gait belt Activity Tolerance: Patient tolerated treatment well Patient left: in bed;with call bell/phone within reach;with bed alarm set;with family/visitor present Nurse Communication: Mobility status PT Visit Diagnosis: Muscle weakness (generalized) (M62.81);Pain Pain - Right/Left: Right Pain - part of body:  (residual limb)    Time: 2355-7322 PT Time Calculation (min) (ACUTE ONLY):  37 min   Charges:   PT Evaluation $PT Eval Moderate Complexity: 1 Mod          Wyona Almas, PT, DPT Acute Rehabilitation Services Office [place] 09/17/2022, 11:12 AM

## 2022-09-17 NOTE — Evaluation (Signed)
Occupational Therapy Evaluation Patient Details Name: Christian Dalton MRN: [id number] DOB: 08-Nov-1941 Today's Date: 09/17/2022   History of Present Illness Pt is a 81 y/o male presenting on 10/23 with worsening bilateral foot gangrene. Scheduled R AKA 10/24. PMH includes: ESRD on TTS HD, DM2, HTN, CAD, depression, COPD, PAD.   Clinical Impression   PTA patient needing assist with ADLs, transfers from spouse; using wc for functional mobility around house without assistance.  Admitted for above and presents with problem list below, including impaired balance, pain in R AKA, decreased activity tolerance and decreased functional use of R UE (dominant). Pt completing bed mobility with mod assist +2, lateral scoot transfers to [facility] with max assist +2 and ADLs with setup to total assist +2. Based on performance today, believe he will benefit from further OT services acutely and after dc at AIR level to optimize independence and safety with ADLs/transfers.      Recommendations for follow up therapy are one component of a multi-disciplinary discharge planning process, led by the attending physician.  Recommendations may be updated based on patient status, additional functional criteria and insurance authorization.   Follow Up Recommendations  Acute inpatient rehab (3hours/day)    Assistance Recommended at Discharge Frequent or constant Supervision/Assistance  Patient can return home with the following Two people to help with walking and/or transfers;A lot of help with bathing/dressing/bathroom;Assistance with cooking/housework;Help with stairs or ramp for entrance;Assist for transportation    Functional Status Assessment  Patient has had a recent decline in their functional status and demonstrates the ability to make significant improvements in function in a reasonable and predictable amount of time.  Equipment Recommendations  Other (comment) (defer)    Recommendations for Other Services Rehab  consult     Precautions / Restrictions Precautions Precautions: Fall Restrictions Weight Bearing Restrictions: No      Mobility Bed Mobility Overal bed mobility: Needs Assistance Bed Mobility: Supine to Sit, Sit to Supine, Rolling Rolling: Mod assist   Supine to sit: Mod assist, +2 for physical assistance Sit to supine: Mod assist, +2 for physical assistance   General bed mobility comments: ModA + 2 for supine <> sit, use of bed pad to scoot hips and cues for use of railing to sit trunk up with additional assist required. Rolling to R/L for re-adjustment of pad    Transfers Overall transfer level: Needs assistance   Transfers: Bed to chair/wheelchair/BSC            Lateral/Scoot Transfers: Max assist, +2 physical assistance General transfer comment: Simulated lateral scoot transfer towards right with verbal/visual demonstration. MaxA + 2 with use of bed pad to scoot up x 4      Balance Overall balance assessment: Needs assistance Sitting-balance support: Feet unsupported, No upper extremity supported Sitting balance-Leahy Scale: Fair Sitting balance - Comments: able to reach with BUE's dynamically with supervision. one LOB which pt was able to perform righting reaction to recover                                   ADL either performed or assessed with clinical judgement   ADL Overall ADL's : Needs assistance/impaired Eating/Feeding: Set up;Sitting Eating/Feeding Details (indicate cue type and reason): spouse feeding pt, encouraged pt to complete and able Grooming: Set up;Sitting               Lower Body Dressing: Total assistance;Sitting/lateral leans   Toilet Transfer: Maximal  assistance;+2 for physical assistance;+2 for safety/equipment Toilet Transfer Details (indicate cue type and reason): simulated lateral scoots towards HOB         Functional mobility during ADLs: Maximal assistance;+2 for physical assistance;+2 for safety/equipment        Vision   Vision Assessment?: No apparent visual deficits     Perception     Praxis      Pertinent Vitals/Pain Pain Assessment Pain Assessment: Faces Faces Pain Scale: Hurts even more Pain Location: R residual limb Pain Descriptors / Indicators: Grimacing, Guarding Pain Intervention(s): Monitored during session, Repositioned, Limited activity within patient's tolerance     Hand Dominance Right   Extremity/Trunk Assessment Upper Extremity Assessment Upper Extremity Assessment: RUE deficits/detail;Generalized weakness RUE Deficits / Details: reports several month hx of R shoulder decreased functional ROM, flexion to 60* RUE Sensation: WNL RUE Coordination: decreased gross motor   Lower Extremity Assessment Lower Extremity Assessment: Defer to PT evaluation       Communication Communication Communication: HOH   Cognition Arousal/Alertness: Awake/alert Behavior During Therapy: WFL for tasks assessed/performed Overall Cognitive Status: Impaired/Different from baseline Area of Impairment: Following commands, Awareness, Problem solving                       Following Commands: Follows one step commands with increased time   Awareness: Emergent Problem Solving: Slow processing, Decreased initiation, Difficulty sequencing, Requires verbal cues General Comments: Pt A&Ox3. Pt following simple commands with increased time but demonstrating decreased attention, slow processing and difficulty problem solving.     General Comments  spouse present and supportive    Exercises     Shoulder Instructions      Home Living Family/patient expects to be discharged to:: Private residence Living Arrangements: Spouse/significant other Available Help at Discharge: Family;Available 24 hours/day Type of Home: Apartment Home Access: Level entry     Home Layout: One level     Bathroom Shower/Tub: Tub/shower unit;Walk-in shower   Bathroom Toilet: Handicapped  height Bathroom Accessibility: Yes   Home Equipment: Conservation officer, nature (2 wheels);Rollator (4 wheels);Cane - single point;Wheelchair - Architect          Prior Functioning/Environment Prior Level of Function : Needs assist             Mobility Comments: using BSC, limited ambulation after fall 3 months ago. Pt wife helping with SPT. transport chair for HD. spends non HD days in w/c, HD days typically is in bed due to fatigue ADLs Comments: assist for ADL's, pt wife washing him up due to fear of falling with shower transfer        OT Problem List: Decreased strength;Decreased activity tolerance;Impaired balance (sitting and/or standing);Pain;Decreased knowledge of use of DME or AE;Decreased knowledge of precautions;Decreased safety awareness      OT Treatment/Interventions: Self-care/ADL training;Therapeutic exercise;DME and/or AE instruction;Therapeutic activities;Patient/family education;Balance training    OT Goals(Current goals can be found in the care plan section) Acute Rehab OT Goals Patient Stated Goal: less pain OT Goal Formulation: With patient Time For Goal Achievement: 10/01/22 Potential to Achieve Goals: Good  OT Frequency: Min 2X/week    Co-evaluation PT/OT/SLP Co-Evaluation/Treatment: Yes Reason for Co-Treatment: For patient/therapist safety;To address functional/ADL transfers   OT goals addressed during session: ADL's and self-care      AM-PAC OT "6 Clicks" Daily Activity     Outcome Measure Help from another person eating meals?: None Help from another person taking care of personal grooming?: A Little Help from another person toileting,  which includes using toliet, bedpan, or urinal?: Total Help from another person bathing (including washing, rinsing, drying)?: A Lot Help from another person to put on and taking off regular upper body clothing?: A Little Help from another person to put on and taking off regular lower body clothing?:  Total 6 Click Score: 14   End of Session Equipment Utilized During Treatment: Gait belt Nurse Communication: Mobility status  Activity Tolerance: Patient tolerated treatment well Patient left: in bed;with call bell/phone within reach;with bed alarm set;with family/visitor present  OT Visit Diagnosis: Other abnormalities of gait and mobility (R26.89);Muscle weakness (generalized) (M62.81);Pain Pain - Right/Left: Right Pain - part of body: Leg                Time: 9030-0923 OT Time Calculation (min): 36 min Charges:  OT General Charges $OT Visit: 1 Visit OT Evaluation $OT Eval Moderate Complexity: 1 Mod  Jolaine Artist, OT Acute Rehabilitation Services Office [place] 09/17/2022, 1:42 PM

## 2022-09-17 NOTE — Progress Notes (Signed)
Inpatient Rehab Admissions Coordinator:    I met with pt. To discuss potential CIR admit. Pt. Feels he is very weak and not sure he can tolerate the intensity of CIR. Wants to discuss with his wife before making a decision. [facility] team will follow up with him tomorrow.   Clemens Catholic, [place], [place] Admissions Coordinator  [phone] (Center) [phone] (office)

## 2022-09-17 NOTE — Progress Notes (Addendum)
[facility] Progress Note   Subjective:   Patient seen and examined in room with wife present.  Eating breakfast.  Reports difficult night due to pain at surgical site.  Denies CP, SOB, orthopnea, abdominal pain and n/v/d.  Feels like edema is a little better compared to normal. Patient/family hoping he will be able to go to CIR.  Discussed it would be up to PT/OT and he would have to commit to put in a lot of work and effort.   Objective Vitals:   09/16/22 1900 09/16/22 2013 09/16/22 2300 09/17/22 0300  BP: 124/60  120/72 120/62  Pulse: 93  85 85  Resp: 13  14   Temp:   98.1 F (36.7 C) 98.1 F (36.7 C)  TempSrc: Oral  Oral Oral  SpO2: 94%  96% 97%  Weight:  90.1 kg    Height:  '5\' 10"'$  (1.778 m)     Physical Exam General:chronically ill appearing male in NAD, sitting up in bed eating breakfast Heart:RRR, no mrg Lungs:CTAB, nml WOB on RA Abdomen:soft, NTND Extremities:1+ UE edema, 1-2+ LLE edema - dry gangrene on toes, RLE dressed Dialysis Access: LU AVF w/chronic ulcerations +b/t   Filed Weights   09/16/22 2013  Weight: 90.1 kg    Intake/Output Summary (Last 24 hours) at 09/17/2022 0941 Last data filed at 09/16/2022 1439 Gross per 24 hour  Intake --  Output 200 ml  Net -200 ml    Additional Objective Labs: Basic Metabolic Panel: Recent Labs  Lab 09/15/22 1155 09/16/22 1228 09/16/22 1259 09/17/22 0235  NA 134* 135 133* 133*  K 6.6* 4.8 4.8 4.8  CL 95* 93* 92* 94*  CO2 22 27  --  26  GLUCOSE 98 55* 118* 75  BUN 61* 42* 41* 48*  CREATININE 7.67* 5.70* 6.00* 6.22*  CALCIUM 9.4 9.1  --  9.2   Liver Function Tests: Recent Labs  Lab 09/15/22 1155  AST 9*  ALT 8  ALKPHOS 231*  BILITOT 0.6  PROT 5.4*  ALBUMIN 2.1*   CBC: Recent Labs  Lab 09/15/22 1155 09/16/22 1259 09/17/22 0235  WBC 21.7*  --  20.7*  NEUTROABS 19.5*  --   --   HGB 11.2* 12.6* 9.9*  HCT 36.0* 37.0* 31.2*  MCV 88.5  --  85.5  PLT 303  --  318   Blood Culture     Component Value Date/Time   SDES BLOOD SITE NOT SPECIFIED 09/15/2022 1510   SPECREQUEST  09/15/2022 1510    BOTTLES DRAWN AEROBIC AND ANAEROBIC Blood Culture results may not be optimal due to an excessive volume of blood received in culture bottles   CULT  09/15/2022 1510    NO GROWTH 2 DAYS Performed at [facility] [address] [place], [place] [address]    REPTSTATUS PENDING 09/15/2022 1510   CBG: Recent Labs  Lab 09/15/22 1520 09/16/22 1224 09/16/22 1256 09/16/22 1454 09/16/22 1514  GLUCAP 102* 53* 112* 67* 118*    Studies/Results: DG Foot Complete Right  Result Date: 09/15/2022 CLINICAL DATA:  Soft tissue infection EXAM: RIGHT FOOT COMPLETE - 3+ VIEW COMPARISON:  07/10/2022 FINDINGS: No fracture or dislocation is seen. There are no focal lytic lesions. Extensive arterial calcifications are seen in soft tissues. No significant interval changes are noted. IMPRESSION: No fracture or dislocation is seen. There are no focal lytic lesions in right foot. Electronically Signed   By: Elmer Picker M.D.   On: 09/15/2022 12:56   DG Foot  Complete Left  Result Date: 09/15/2022 CLINICAL DATA:  Pain, soft tissue infection EXAM: LEFT FOOT - COMPLETE 3+ VIEW COMPARISON:  07/10/2022 FINDINGS: No fracture or dislocation is seen. There are no focal lytic lesions. Extensive arterial calcifications are seen in soft tissues. There is subcutaneous linear calcification posterior to the calcaneus with no significant change. IMPRESSION: No fracture or dislocation is seen. There are no focal lytic lesions in the left foot. Electronically Signed   By: Elmer Picker M.D.   On: 09/15/2022 12:55    Medications:  magnesium sulfate bolus IVPB     vancomycin      aspirin EC  81 mg Oral Daily   atorvastatin  10 mg Oral Daily   buPROPion  150 mg Oral Daily   docusate sodium  100 mg Oral Daily   escitalopram  10 mg Oral QHS   heparin  5,000 Units Subcutaneous Q8H    HYDROmorphone  (DILAUDID) injection  0.5 mg Intravenous Once   lactulose  10 g Oral QPM   levothyroxine  125 mcg Oral Q0600   linaclotide  290 mcg Oral QPM   midodrine  10 mg Oral TID WC   montelukast  10 mg Oral QHS   sevelamer carbonate  800 mg Oral BID WC    Dialysis Orders: TTS - [facility]  3 hrs 45 min, BFR 425, DFR 800,  EDW 88.9 kg, 2 K/ 2 Ca   Access: LUE AVF  No Heparin  Mircera 50 mcg q2wks - last 10/19 Calcitriol 1.75 mcg PO qHD Venofer qweek      Assessment/Plan:  Gangrenous LE Wound infection - Bilateral lower extremity gangrenous wounds. R AKA yesterday by Dr. Donzetta Matters. Hoping to go to CIR.  Hyperkalemia -  Resolved with HD.  Reminded to follow low K diet.  Renal diet ordered.   Chronic ulcer on AVF - Looks a little better today.  No spontaneous bleeding. Stick away from and have VVS eval.   ESRD on HD TTS - HD yesterday off Monday.  No acute indications for HD today.  Plan for HD tomorrow per regular schedule.  Hypertension/volume  - BP low. Volume overloaded on exam but UF limited by hypotension.  Continue to hold home meds (lisinopril 2.'5mg'$  qd, carvedilol 3.'125mg'$  qd).  Given albumin and midodrine with HD.  Now on midodrine '10mg'$  TID.  Continue titrate down volume as tolerated.   Anemia of CKD - Hgb 9.9 post surgery. Receives venofer every week - hold d/t infection. Continue to monitor. Transfuse for Hgb <7. ESA recently dosed.   Secondary Hyperparathyroidism -  Ca 9.4. Check Phos. Continue VDRA and binders when eating -  Renvela 3AC TID.  Nutrition - Renal diet w/fluid restrictions.  A fib DM - per PMD  Jen Mow, PA-C [facility] 09/17/2022,9:41 AM  LOS: 2 days    Seen and examined independently.  Agree with note and exam as documented above by physician extender and as noted here.  Spoke with wife at bedside.  Discomfort post-op overnight.  Amputation hasn't really helped pain he states  General adult male in bed in no acute distress HEENT normocephalic  atraumatic extraocular movements intact sclera anicteric Neck supple trachea midline Lungs clear to auscultation bilaterally normal work of breathing at rest  Heart S1S2 no rub Abdomen soft nontender nondistended Extremities 1+ edema left leg; left leg gangrenous toes; right AKA Psych normal mood and affect Neuro - alert and oriented; very hard of hearing and reading lips helps Access LUE AVF  and overlying ulcerations; bruit and thrill   Gangrenous lower extremity wounds - s/p right AKA on 10/24   Hyperkalemia - resolved after HD   Chronic ulcer on AVF - as above - stick away from the lesion; VVS is seeing him for the PAD s/p AKA.  Please assess AVF   ESRD - HD per TTS schedule    Hypotension - home meds are off.  Limited UF due to the hypotension related to sepsis. On midodrine (new med)   Anemia CKD - no ESA indicated at this time - follow for need.  Recently ESA was dosed outpatient   Secondary hyperparathyroidism - continue binders.  Resume calcitriol    Disposition - anticipate some type of rehab  Claudia Desanctis, MD 09/17/2022  10:18 AM

## 2022-09-17 NOTE — Progress Notes (Signed)
   Subjective:  The patient and his wife report he has a lot of RLE pain overnight. His pain is currently improved and he feels his pain medications are working. Denies nausea and vomiting. No bowel movement yet, but he has been passing a little gas. His wife mentioned he takes lactulose at home for bowel movements.  No other concerns at this time.   Objective:  Vital signs in last 24 hours: Vitals:   09/16/22 1900 09/16/22 2013 09/16/22 2300 09/17/22 0300  BP: 124/60  120/72 120/62  Pulse: 93  85 85  Resp: 13  14   Temp:   98.1 F (36.7 C) 98.1 F (36.7 C)  TempSrc: Oral  Oral Oral  SpO2: 94%  96% 97%  Weight:  90.1 kg    Height:  '5\' 10"'$  (1.778 m)     Weight change:   Intake/Output Summary (Last 24 hours) at 09/17/2022 0942 Last data filed at 09/16/2022 1439 Gross per 24 hour  Intake --  Output 200 ml  Net -200 ml   General: Appears to be resting comfortably; No acute distress  Cardiovascular: Regular rate and rhythm; No murmurs, rubs, and gallops; 2+ left dp pulse. Pulmonary: Non-labored breathing; Lungs clear to auscultation bilaterally; No wheezes, rales, or crackles.  Extremities: Bilateral upper extremity edema; Right AKA dressing clean, dry, intact; Left foot dry gangrene 1st and 2nd toes, stable, increased erythema to the dorsal aspect of left foot   Assessment/Plan:  Principal Problem:   Gangrene of both feet (HCC) Active Problems:   ESRD (end stage renal disease) on dialysis (HCC)   Type II diabetes mellitus with renal manifestations (HCC)   HTN (hypertension)   HLD (hyperlipidemia)   COPD (chronic obstructive pulmonary disease) (HCC)   HFrEF (heart failure with reduced ejection fraction) (HCC)  Christian Dalton is a 81 y.o. male with past medical history of ESRD on TTS HD, T2DM, HTN, CAD, HLD, hypothyroidism, depression, COPD, HFrEF, and peripheral artery disease presenting with worsening bilateral foot ischemic necrosis, status post right AKA performed  yesterday, 10/24.  #Bilateral foot ischemic necrosis  The patient is status post right AKA performed yesterday, 10/24. Per op note, the procedure was well tolerated with no complications. Will follow VVS recommendations.  - Dilaudid 0.5 q4 PRN - Discontinue Vancomycin - Trend CBC, f/u blood cultures  - Medical management of ischemic vascular disease with aspirin '81mg'$  and atorvastatin '80mg'$  daily - Heparin TID for VTE prophylaxis   #Constipation  - Lactulose 10 g every evening for constipation    #ESRD on hemodialysis  Per nephrology. Slightly volume overloaded, but does not require emergent dialysis. Will likely need to be dialyzed today or tomorrow, per his normal schedule.   - Renal diet  - Midodrine 10 TID with meals  - Renvela 800 BID with meals    #T2DM A1c 6.0 earlier this month.  -SSI q4 post op   #HTN #CAD #HLD #HFrEF On lisinopril 2.'5mg'$  daily and coreg 3.125 BID, holding given low BP at HD. Continue lipitor '10mg'$ .   #COPD Respiratory status stable on RA in HD.  -continue singulair '10mg'$  QHS, albuterol nebs q6h prn   #Depression Continue welbutrin 150 mg daily, lexapro 10 mg QHS   #Hypothyroidism Continue levothyroxine 125 mcg daily     LOS: 2 days   Paulo Fruit, Medical Student 09/17/2022, 9:42 AM

## 2022-09-17 NOTE — Progress Notes (Signed)
Inpatient Rehab Admissions Coordinator:   CIR consult received. PT note is in, await OT eval to assess for candidacy.   Clemens Catholic, [place], [place] Admissions Coordinator  [phone] ([place]) [phone] (office)

## 2022-09-17 NOTE — Progress Notes (Addendum)
  Progress Note    09/17/2022 7:24 AM 1 Day Post-Op  Subjective:  patient very sleepy. Wife at bedside says patient was up with a lot of pain overnight   Vitals:   09/16/22 2300 09/17/22 0300  BP: 120/72 120/62  Pulse: 85 85  Resp: 14   Temp: 98.1 F (36.7 C) 98.1 F (36.7 C)  SpO2: 96% 97%   Physical Exam: Cardiac:  regular Lungs:  non labored Incisions:  Right AKA dressings c/d/i Extremities:  well perfused and warm. Left foot with dry gangrene 1st and 2nd toes. stable Neurologic: sleepy  CBC    Component Value Date/Time   WBC 20.7 (H) 09/17/2022 0235   RBC 3.65 (L) 09/17/2022 0235   HGB 9.9 (L) 09/17/2022 0235   HCT 31.2 (L) 09/17/2022 0235   PLT 318 09/17/2022 0235   MCV 85.5 09/17/2022 0235   MCH 27.1 09/17/2022 0235   MCHC 31.7 09/17/2022 0235   RDW 17.6 (H) 09/17/2022 0235   LYMPHSABS 0.8 09/15/2022 1155   MONOABS 1.0 09/15/2022 1155   EOSABS 0.2 09/15/2022 1155   BASOSABS 0.1 09/15/2022 1155    BMET    Component Value Date/Time   NA 133 (L) 09/17/2022 0235   K 4.8 09/17/2022 0235   CL 94 (L) 09/17/2022 0235   CO2 26 09/17/2022 0235   GLUCOSE 75 09/17/2022 0235   BUN 48 (H) 09/17/2022 0235   CREATININE 6.22 (H) 09/17/2022 0235   CALCIUM 9.2 09/17/2022 0235   GFRNONAA 9 (L) 09/17/2022 0235   GFRAA 7 (L) 10/26/2019 1626    INR    Component Value Date/Time   INR 1.2 09/09/2022 1550     Intake/Output Summary (Last 24 hours) at 09/17/2022 0724 Last data filed at 09/16/2022 1439 Gross per 24 hour  Intake --  Output 200 ml  Net -200 ml     Assessment/Plan:  81 y.o. male is s/p Right AKA 1 Day Post-Op   Right AKA dressings c/d/I. Will take down dressings tomorrow Left leg warm and well perfused. Dry stable gangrene of 1st and 2nd toes Pain control Hemodynamically stable Leukocytosis. Remains on Vanc PT/ OT to eval today Medical management per primary  DVT prophylaxis:  sq heparin   Karoline Caldwell, PA-C [facility] [phone] 09/17/2022 7:24 AM  I have independently interviewed and examined patient and agree with PA assessment and plan above.  Pain better controlled today.  I will evaluate his left upper arm AV fistula he has a scab but there is really no pulsatility they should avoid cannulation at the scab site otherwise the fistula appears to be working well.  Luis Nickles C. Donzetta Matters, MD [facility] [place] Office: [phone] Pager: [phone]

## 2022-09-18 DIAGNOSIS — I96 Gangrene, not elsewhere classified: Secondary | ICD-10-CM | POA: Diagnosis not present

## 2022-09-18 LAB — LIPID PANEL
Cholesterol: 67 mg/dL (ref 0–200)
HDL: 26 mg/dL — ABNORMAL LOW (ref >40)
LDL Cholesterol: 23 mg/dL (ref 0–99)
Total CHOL/HDL Ratio: 2.6 RATIO
Triglycerides: 92 mg/dL (ref <150)
VLDL: 18 mg/dL (ref 0–40)

## 2022-09-18 LAB — CBC WITH DIFFERENTIAL/PLATELET
Abs Immature Granulocytes: 0.17 10*3/uL — ABNORMAL HIGH (ref 0.00–0.07)
Basophils Absolute: 0.1 10*3/uL (ref 0.0–0.1)
Basophils Relative: 1 %
Eosinophils Absolute: 0.3 10*3/uL (ref 0.0–0.5)
Eosinophils Relative: 2 %
HCT: 32.7 % — ABNORMAL LOW (ref 39.0–52.0)
Hemoglobin: 10.2 g/dL — ABNORMAL LOW (ref 13.0–17.0)
Immature Granulocytes: 1 %
Lymphocytes Relative: 6 %
Lymphs Abs: 1.1 10*3/uL (ref 0.7–4.0)
MCH: 27.1 pg (ref 26.0–34.0)
MCHC: 31.2 g/dL (ref 30.0–36.0)
MCV: 87 fL (ref 80.0–100.0)
Monocytes Absolute: 1.3 10*3/uL — ABNORMAL HIGH (ref 0.1–1.0)
Monocytes Relative: 6 %
Neutro Abs: 16.6 10*3/uL — ABNORMAL HIGH (ref 1.7–7.7)
Neutrophils Relative %: 84 %
Platelets: 359 10*3/uL (ref 150–400)
RBC: 3.76 MIL/uL — ABNORMAL LOW (ref 4.22–5.81)
RDW: 17.8 % — ABNORMAL HIGH (ref 11.5–15.5)
WBC: 19.6 10*3/uL — ABNORMAL HIGH (ref 4.0–10.5)
nRBC: 0.1 % (ref 0.0–0.2)

## 2022-09-18 LAB — BASIC METABOLIC PANEL
Anion gap: 15 (ref 5–15)
BUN: 60 mg/dL — ABNORMAL HIGH (ref 8–23)
CO2: 24 mmol/L (ref 22–32)
Calcium: 9 mg/dL (ref 8.9–10.3)
Chloride: 93 mmol/L — ABNORMAL LOW (ref 98–111)
Creatinine, Ser: 6.96 mg/dL — ABNORMAL HIGH (ref 0.61–1.24)
GFR, Estimated: 7 mL/min — ABNORMAL LOW (ref >60)
Glucose, Bld: 86 mg/dL (ref 70–99)
Potassium: 5.4 mmol/L — ABNORMAL HIGH (ref 3.5–5.1)
Sodium: 132 mmol/L — ABNORMAL LOW (ref 135–145)

## 2022-09-18 LAB — CBC
HCT: 32.9 % — ABNORMAL LOW (ref 39.0–52.0)
Hemoglobin: 10.2 g/dL — ABNORMAL LOW (ref 13.0–17.0)
MCH: 26.8 pg (ref 26.0–34.0)
MCHC: 31 g/dL (ref 30.0–36.0)
MCV: 86.6 fL (ref 80.0–100.0)
Platelets: 338 10*3/uL (ref 150–400)
RBC: 3.8 MIL/uL — ABNORMAL LOW (ref 4.22–5.81)
RDW: 17.6 % — ABNORMAL HIGH (ref 11.5–15.5)
WBC: 19.4 10*3/uL — ABNORMAL HIGH (ref 4.0–10.5)
nRBC: 0 % (ref 0.0–0.2)

## 2022-09-18 LAB — SURGICAL PATHOLOGY

## 2022-09-18 LAB — PHOSPHORUS: Phosphorus: 5.6 mg/dL — ABNORMAL HIGH (ref 2.5–4.6)

## 2022-09-18 NOTE — Progress Notes (Addendum)
Received patient in bed to unit.  Alert and oriented.  Informed consent signed and in chart.   Treatment initiated: 7414 Treatment completed: 540p  Patient tolerated well.  Transported back to the room  Alert, without acute distress.  Hand-off given to patient's nurse.   Access used: Yes Access issues: No  Total UF removed: 2400 Medication(s) given: Dilaudid 1 mg Post HD VS: 98.4, 78, 18, 103/56 R/A 98 Post HD weight: 86.1 kg   [facility]

## 2022-09-18 NOTE — Progress Notes (Signed)
Physical Therapy Treatment Patient Details Name: Christian Dalton MRN: [id number] DOB: 05/28/1941 Today's Date: 09/18/2022   History of Present Illness Pt is a 81 y/o male presenting on 09/15/22 with worsening bilateral foot gangrene, s/p R AKA 10/24. PMH includes: ESRD on TTS HD, DM2, HTN, CAD, depression, COPD, PAD.    PT Comments    Pt slowly progressing with mobility. Today's session focused on bed mobility and static sitting. Pt required min guard during EOB sitting due to multiple LOB in various directions. Instructed pt on use of slide board, declined trial of it several times, pt not interested in transfer to chair due to post-op pain. Pt remains limited by generalized weakness, decreased activity tolerance, and impaired balance strategies/postural reactions. Continue to recommend acute PT services to maximize functional mobility and independence prior to d/c from hospital. Pt reports not interested in AIR, therefore updating recommendation to SNF-level therapies    Recommendations for follow up therapy are one component of a multi-disciplinary discharge planning process, led by the attending physician.  Recommendations may be updated based on patient status, additional functional criteria and insurance authorization.  Follow Up Recommendations  Skilled nursing-short term rehab (<3 hours/day)     Assistance Recommended at Discharge Frequent or constant Supervision/Assistance  Patient can return home with the following Two people to help with walking and/or transfers;A lot of help with bathing/dressing/bathroom;Assistance with cooking/housework;Assist for transportation;Help with stairs or ramp for entrance   Equipment Recommendations  Other (comment) (TBD)    Recommendations for Other Services       Precautions / Restrictions Precautions Precautions: Fall Restrictions Weight Bearing Restrictions: No     Mobility  Bed Mobility Overal bed mobility: Needs Assistance Bed  Mobility: Supine to Sit, Sit to Supine, Rolling Rolling: Mod assist   Supine to sit: Mod assist, +2 for physical assistance Sit to supine: Mod assist   General bed mobility comments: ModA +2 supine > sit due to poor trunk control and intense R residual limb pain. Pt able to demonstrate functional use of L LE for scooting up in bed, did require assistance to get L LE back on the bed. Minimal lateral scoot to L min guard, to R maxA +2 with use of pad.    Transfers                        Ambulation/Gait                   Stairs             Wheelchair Mobility    Modified Rankin (Stroke Patients Only)       Balance Overall balance assessment: Needs assistance Sitting-balance support: Single extremity supported, Bilateral upper extremity supported Sitting balance-Leahy Scale: Poor Sitting balance - Comments: pt had 1 posterior LOB while sitting supervision, requiring min guard trunk control for remainder of session.                                    Cognition Arousal/Alertness: Awake/alert Behavior During Therapy: WFL for tasks assessed/performed (Irritated) Overall Cognitive Status: Impaired/Different from baseline Area of Impairment: Following commands, Awareness, Problem solving                       Following Commands: Follows one step commands with increased time   Awareness: Emergent Problem Solving: Slow processing, Decreased initiation, Difficulty sequencing, Requires  verbal cues General Comments: Pt's wife states he is stubborn. Cognition difficult to assess due to making jokes during session, pt is very HOH and unable to determine if this is impacting what appears as cognitive deficits        Exercises      General Comments General comments (skin integrity, edema, etc.): VSS during mobility      Pertinent Vitals/Pain Pain Assessment Pain Assessment: Faces Faces Pain Scale: Hurts whole lot Pain Location: R  residual limb Pain Descriptors / Indicators: Grimacing, Guarding, Moaning Pain Intervention(s): Monitored during session, Repositioned    Home Living                          Prior Function            PT Goals (current goals can now be found in the care plan section) Acute Rehab PT Goals Patient Stated Goal: to go to rehab PT Goal Formulation: With patient/family Time For Goal Achievement: 10/01/22 Potential to Achieve Goals: Fair Additional Goals Additional Goal #1: Pt will propel w/c with BUE's x 150 ft at a minA level Progress towards PT goals: Progressing toward goals    Frequency    Min 2X/week      PT Plan Current plan remains appropriate    Co-evaluation              AM-PAC PT "6 Clicks" Mobility   Outcome Measure  Help needed turning from your back to your side while in a flat bed without using bedrails?: A Lot Help needed moving from lying on your back to sitting on the side of a flat bed without using bedrails?: Total Help needed moving to and from a bed to a chair (including a wheelchair)?: Total Help needed standing up from a chair using your arms (e.g., wheelchair or bedside chair)?: Total Help needed to walk in hospital room?: Total Help needed climbing 3-5 steps with a railing? : Total 6 Click Score: 7    End of Session   Activity Tolerance: Patient limited by pain Patient left: in bed;with call bell/phone within reach;with bed alarm set Nurse Communication: Mobility status PT Visit Diagnosis: Muscle weakness (generalized) (M62.81);Pain Pain - Right/Left: Right     Time: 8832-5498 PT Time Calculation (min) (ACUTE ONLY): 23 min  Charges:  $Therapeutic Activity: 23-37 mins                     Chipper Oman, SPT    [name] 09/18/2022, 2:09 PM

## 2022-09-18 NOTE — Progress Notes (Signed)
   S:  Doing well today, having some tenderness at his right leg stump. Eating and drinking well. Hearing aides in place today.   O:  Vitals:   09/18/22 1700 09/18/22 1730  BP: 105/61 103/63  Pulse: 91 92  Resp: 12 17  Temp:    SpO2: 98% 98%   Gen: well appearing, no distress Ext: right leg AKA surgical site is stapled, no breakdown, no errythema or drainage, tender to touch. Left foot is cool, gangrene of first toes is stable, I cannot palpate a DP pulse today Psych: normal affect, not depressed or anxious appearing Neuro: alert, full strength in the upper and lower extremity   A/P:  Hospital day #3 for this 81 year old person living with ESRD admitted with right ischemic foot requiring above knee amputation.  Principal Problem:   Gangrene of both feet ([place]) Active Problems:   ESRD (end stage renal disease) on dialysis (HCC)   Type II diabetes mellitus with renal manifestations (HCC)   HTN (hypertension)   HLD (hyperlipidemia)   COPD (chronic obstructive pulmonary disease) (HCC)   HFrEF (heart failure with reduced ejection fraction) (HCC)   Right foot necrosis: Right AKA completed and seems to be healing well. He has ischemic vascular disease due to diabetes. He has pain and tenderness, incomplete response to opioids probably due to tolerance from chronic percocet use. Would prefer to transition to oral dilaudid at discharge which is preferred opioid in ESRD. - Aspirin '81mg'$  and atorvastatin '10mg'$  daily - Dispo to SNF for subacute rehabilitation - Abx stopped, moderate leukocytosis stable, no fevers  Left toe 1-2 necrosis: surgical planning per vascular team. Will check ultrasound doppler as pulse is not palpable today. Continue medical management with aspirin and statin. I wonder if these toes are driving the white count to remain elevated.  Hypotension: in the setting of HD, patient is chronically hypertensive. Would expect hypotension to improve as oral intake improves. BP  103/63 today.  - Hold midodrine tomorrow and monitor response  Diabetes: A1c 6.0%. Blood sugar well controlled this morning at 86. Goal is to maintain FSG < 180 to promote wound healing.  - SSI protocol  ESRD:  HD per nephrology. Also noted small wound on his left AV fistula, no bleeding.   Heparin for VTE prophylaxis  Renal diet   Axel Filler, MD 09/18/2022, 7:22 PM

## 2022-09-18 NOTE — Progress Notes (Addendum)
  Progress Note    09/18/2022 7:28 AM 2 Days Post-Op  Subjective:  struggling with pain post op   Vitals:   09/18/22 0650 09/18/22 0654  BP: 116/68   Pulse: 85   Resp: 16 20  Temp:    SpO2: 100% 97%    Physical Exam: Lungs:  nonlabored Incisions:  R AKA dressing taken down with some dried serosanguineous drainage. R AKA intact with staples, no bleeding or drainage. Extremities:  L foot with stable dry gangrene of 1st and 2nd toes  CBC    Component Value Date/Time   WBC 19.4 (H) 09/18/2022 0152   RBC 3.80 (L) 09/18/2022 0152   HGB 10.2 (L) 09/18/2022 0152   HCT 32.9 (L) 09/18/2022 0152   PLT 338 09/18/2022 0152   MCV 86.6 09/18/2022 0152   MCH 26.8 09/18/2022 0152   MCHC 31.0 09/18/2022 0152   RDW 17.6 (H) 09/18/2022 0152   LYMPHSABS 0.8 09/15/2022 1155   MONOABS 1.0 09/15/2022 1155   EOSABS 0.2 09/15/2022 1155   BASOSABS 0.1 09/15/2022 1155    BMET    Component Value Date/Time   NA 132 (L) 09/18/2022 0152   K 5.4 (H) 09/18/2022 0152   CL 93 (L) 09/18/2022 0152   CO2 24 09/18/2022 0152   GLUCOSE 86 09/18/2022 0152   BUN 60 (H) 09/18/2022 0152   CREATININE 6.96 (H) 09/18/2022 0152   CALCIUM 9.0 09/18/2022 0152   GFRNONAA 7 (L) 09/18/2022 0152   GFRAA 7 (L) 10/26/2019 1626    INR    Component Value Date/Time   INR 1.2 09/09/2022 1550     Intake/Output Summary (Last 24 hours) at 09/18/2022 0728 Last data filed at 09/17/2022 1952 Gross per 24 hour  Intake 250 ml  Output --  Net 250 ml     Assessment/Plan:  81 y.o. male is 2 days post op, s/p: R AKA for gangrene   -Dressing taken down today. R AKA stump with healthy tissue, intact with staples -LLE warm and well perfused. Stable dry gangrene of 1st and 2nd toes -Pain control -PT/OT eval has recommended CIR. Patient is to discuss with his wife whether he can handle the the intensity -Leukocytosis trending down. Vanc was d/c'ed yesterday. Patient is afebrile -Medical management per  primary -DVT prophylaxis:  sq heparin   Vicente Serene, PA-C [facility] [phone] 09/18/2022 7:28 AM  I have independently interviewed and examined patient and agree with PA assessment and plan above.   Jenavieve Freda C. Donzetta Matters, MD [facility] [place] Office: [phone] Pager: [phone]

## 2022-09-18 NOTE — Progress Notes (Addendum)
[facility] Progress Note   Subjective:   Patient seen and examined at bedside with wife present.  Hungry because he slept through breakfast.  Admits to pain in RLE overnight.  Debating on if he wants to go to CIR vs SNF he was in previously in [place]. Denies CP, SOB, abdominal pain and n/v/d.   Objective Vitals:   09/18/22 0530 09/18/22 0650 09/18/22 0654 09/18/22 0920  BP:  116/68  (!) 123/52  Pulse:  85  84  Resp: '17 16 20 15  '$ Temp:    98.2 F (36.8 C)  TempSrc:    Oral  SpO2:  100% 97% 100%  Weight: 89.3 kg     Height:       Physical Exam General:chronically ill appearing male in NAD Heart:RRR, no mrg Lungs:CTAB, nml WOB on RA Abdomen:soft, NTND Extremities:1+ edema on LLE, R AKA dressed, 1-2+ UE edema Dialysis Access: LU AVF w/chronic ulcers +b   Filed Weights   09/16/22 2013 09/18/22 0530  Weight: 90.1 kg 89.3 kg    Intake/Output Summary (Last 24 hours) at 09/18/2022 1140 Last data filed at 09/17/2022 1952 Gross per 24 hour  Intake 250 ml  Output --  Net 250 ml    Additional Objective Labs: Basic Metabolic Panel: Recent Labs  Lab 09/16/22 1228 09/16/22 1259 09/17/22 0235 09/18/22 0152  NA 135 133* 133* 132*  K 4.8 4.8 4.8 5.4*  CL 93* 92* 94* 93*  CO2 27  --  26 24  GLUCOSE 55* 118* 75 86  BUN 42* 41* 48* 60*  CREATININE 5.70* 6.00* 6.22* 6.96*  CALCIUM 9.1  --  9.2 9.0  PHOS  --   --  4.9* 5.6*   Liver Function Tests: Recent Labs  Lab 09/15/22 1155  AST 9*  ALT 8  ALKPHOS 231*  BILITOT 0.6  PROT 5.4*  ALBUMIN 2.1*   CBC: Recent Labs  Lab 09/15/22 1155 09/16/22 1259 09/17/22 0235 09/18/22 0140 09/18/22 0152  WBC 21.7*  --  20.7* 19.6* 19.4*  NEUTROABS 19.5*  --   --  16.6*  --   HGB 11.2*   < > 9.9* 10.2* 10.2*  HCT 36.0*   < > 31.2* 32.7* 32.9*  MCV 88.5  --  85.5 87.0 86.6  PLT 303  --  318 359 338   < > = values in this interval not displayed.   Blood Culture    Component Value Date/Time   SDES BLOOD  SITE NOT SPECIFIED 09/15/2022 1510   SPECREQUEST  09/15/2022 1510    BOTTLES DRAWN AEROBIC AND ANAEROBIC Blood Culture results may not be optimal due to an excessive volume of blood received in culture bottles   CULT  09/15/2022 1510    NO GROWTH 3 DAYS Performed at [facility], [place] [address], [place], [place] [address]    REPTSTATUS PENDING 09/15/2022 1510    CBG: Recent Labs  Lab 09/15/22 1520 09/16/22 1224 09/16/22 1256 09/16/22 1454 09/16/22 1514  GLUCAP 102* 53* 112* 67* 118*   Medications:  magnesium sulfate bolus IVPB      aspirin EC  81 mg Oral Daily   atorvastatin  10 mg Oral Daily   buPROPion  150 mg Oral Daily   calcitRIOL  1.75 mcg Oral Q T,Th,Sa-HD   Chlorhexidine Gluconate Cloth  6 each Topical Q0600   docusate sodium  100 mg Oral Daily   escitalopram  10 mg Oral QHS   heparin  5,000 Units Subcutaneous Q8H  lactulose  10 g Oral QPM   levothyroxine  125 mcg Oral Q0600   linaclotide  290 mcg Oral QPM   midodrine  10 mg Oral Once per day on Tue Thu Sat   montelukast  10 mg Oral QHS   sevelamer carbonate  2,400 mg Oral BID WC    Dialysis Orders: TTS - [facility]  3 hrs 45 min, BFR 425, DFR 800,  EDW 88.9 kg, 2 K/ 2 Ca   Access: LUE AVF  No Heparin  Mircera 50 mcg q2wks - last 10/19 Calcitriol 1.75 mcg PO qHD Venofer qweek      Assessment/Plan:  Gangrenous LE Wound infection - Bilateral lower extremity gangrenous wounds. R AKA 10/24 by Dr. Donzetta Matters. Deciding on rehab placement.  PT recommended CIR. Hyperkalemia -  Resolved with HD.  Reminded to follow low K diet.  Renal diet ordered.  K 5.4 - to have HD today. Chronic ulcer on AVF - Looks a little better today.  No spontaneous bleeding. Stick away from.  ESRD on HD TTS - HD off schedule Monday.  Plan for HD today per regular schedule.  Hypertension/volume  - BP in goal. Volume overloaded on exam but UF limited by hypotension.  Continue to hold home meds (lisinopril 2.'5mg'$  qd, carvedilol 3.'125mg'$  qd).   Given albumin and midodrine with HD.  Now on midodrine '10mg'$  TID.  Continue titrate down volume as tolerated.   Anemia of CKD - Hgb 10.2 post surgery. Receives venofer every week - hold d/t infection. Continue to monitor. Transfuse for Hgb <7. ESA recently dosed.   Secondary Hyperparathyroidism -  Ca 9.4, phos close to goal. Continue VDRA and binders -  Renvela 3AC TID.  Nutrition - Renal diet w/fluid restrictions.  A fib DM - per PMD  Jen Mow, PA-C [facility] 09/18/2022,11:40 AM  LOS: 3 days    Seen and examined independently.  Agree with note and exam as documented above by physician extender and as noted here.  General adult male in bed in no acute distress HEENT normocephalic atraumatic extraocular movements intact sclera anicteric Neck supple trachea midline Lungs clear to auscultation bilaterally normal work of breathing at rest  Heart S1S2 no rub Abdomen soft nontender nondistended Extremities 1+ edema left leg; left leg gangrenous toes; right AKA Psych normal mood and affect Neuro - alert and oriented; very hard of hearing Access LUE AVF and overlying ulcerations; bruit and thrill  Gangrenous lower extremity wounds - s/p right AKA on 10/24   Hyperkalemia - low K diet. On HD   Chronic ulcer on AVF - as above - stick away from the lesion; VVS is seeing him for the PAD s/p AKA.     ESRD - HD per TTS schedule    Hypotension - home meds are off.  on midodrine pre-HD which we may be able to stop   Anemia CKD - no ESA indicated at this time - follow for need.  Recently ESA was dosed outpatient   Secondary hyperparathyroidism - continue binders.  on calcitriol (please do not include on discharge med list)   Disposition - for placement   Claudia Desanctis, MD 09/18/2022 3:10 PM

## 2022-09-18 NOTE — Progress Notes (Signed)
Pt had a complaint of unable to urinate and feeling of bladder retention. Bladder scanning performed and found 88 ml of urine. Pt will get hemodialysis this am. We will monitor.   Kennyth Lose, RN

## 2022-09-18 NOTE — Progress Notes (Signed)
Pt is alert and fully oriented x 4, vital signs stable, no acute distress noted. Pain is well controlled with Dilaudid and Percocet. Pt is able to rest well tonight. Right AKA compression dressing is dry and clean, no drainage. We will monitor.  Kennyth Lose, RN

## 2022-09-18 NOTE — Progress Notes (Signed)
IP rehab admissions - noted from therapies that patient would like SNF rather than CIR.  Will let CM/SW know of patient preference for SNF placement.  Call for questions.  [phone]

## 2022-09-19 ENCOUNTER — Inpatient Hospital Stay (HOSPITAL_COMMUNITY): Payer: Medicare Other

## 2022-09-19 DIAGNOSIS — I739 Peripheral vascular disease, unspecified: Secondary | ICD-10-CM | POA: Diagnosis not present

## 2022-09-19 DIAGNOSIS — I96 Gangrene, not elsewhere classified: Secondary | ICD-10-CM | POA: Diagnosis not present

## 2022-09-19 LAB — CBC WITH DIFFERENTIAL/PLATELET
Abs Immature Granulocytes: 0.1 10*3/uL — ABNORMAL HIGH (ref 0.00–0.07)
Basophils Absolute: 0.1 10*3/uL (ref 0.0–0.1)
Basophils Relative: 1 %
Eosinophils Absolute: 0.4 10*3/uL (ref 0.0–0.5)
Eosinophils Relative: 2 %
HCT: 29.4 % — ABNORMAL LOW (ref 39.0–52.0)
Hemoglobin: 9.1 g/dL — ABNORMAL LOW (ref 13.0–17.0)
Immature Granulocytes: 1 %
Lymphocytes Relative: 6 %
Lymphs Abs: 1 10*3/uL (ref 0.7–4.0)
MCH: 26.6 pg (ref 26.0–34.0)
MCHC: 31 g/dL (ref 30.0–36.0)
MCV: 86 fL (ref 80.0–100.0)
Monocytes Absolute: 1.2 10*3/uL — ABNORMAL HIGH (ref 0.1–1.0)
Monocytes Relative: 7 %
Neutro Abs: 14.4 10*3/uL — ABNORMAL HIGH (ref 1.7–7.7)
Neutrophils Relative %: 83 %
Platelets: 353 10*3/uL (ref 150–400)
RBC: 3.42 MIL/uL — ABNORMAL LOW (ref 4.22–5.81)
RDW: 17.6 % — ABNORMAL HIGH (ref 11.5–15.5)
WBC: 17.1 10*3/uL — ABNORMAL HIGH (ref 4.0–10.5)
nRBC: 0 % (ref 0.0–0.2)

## 2022-09-19 LAB — RENAL FUNCTION PANEL
Albumin: 1.8 g/dL — ABNORMAL LOW (ref 3.5–5.0)
Anion gap: 14 (ref 5–15)
BUN: 47 mg/dL — ABNORMAL HIGH (ref 8–23)
CO2: 26 mmol/L (ref 22–32)
Calcium: 8.9 mg/dL (ref 8.9–10.3)
Chloride: 94 mmol/L — ABNORMAL LOW (ref 98–111)
Creatinine, Ser: 5.83 mg/dL — ABNORMAL HIGH (ref 0.61–1.24)
GFR, Estimated: 9 mL/min — ABNORMAL LOW (ref >60)
Glucose, Bld: 69 mg/dL — ABNORMAL LOW (ref 70–99)
Phosphorus: 5 mg/dL — ABNORMAL HIGH (ref 2.5–4.6)
Potassium: 4.8 mmol/L (ref 3.5–5.1)
Sodium: 134 mmol/L — ABNORMAL LOW (ref 135–145)

## 2022-09-19 LAB — GLUCOSE, CAPILLARY
Glucose-Capillary: 69 mg/dL — ABNORMAL LOW (ref 70–99)
Glucose-Capillary: 118 mg/dL — ABNORMAL HIGH (ref 70–99)
Glucose-Capillary: 98 mg/dL (ref 70–99)

## 2022-09-19 MED ORDER — INSULIN ASPART 100 UNIT/ML IJ SOLN: 0.0000 [IU] | Freq: Three times a day (TID) | INTRAMUSCULAR | Status: DC

## 2022-09-19 MED ORDER — LACTULOSE 10 GM/15ML PO SOLN
10.0000 g | Freq: Two times a day (BID) | ORAL | Status: DC
  Administered 2022-09-19 – 2022-09-29 (×13): 10 g via ORAL
  Filled 2022-09-19 (×17): qty 15

## 2022-09-19 MED ORDER — INSULIN ASPART 100 UNIT/ML IJ SOLN: 0.0000 [IU] | Freq: Every day | INTRAMUSCULAR | Status: DC

## 2022-09-19 MED ORDER — PROSOURCE PLUS PO LIQD
30.0000 mL | Freq: Two times a day (BID) | ORAL | Status: DC
  Administered 2022-09-19 – 2022-09-29 (×10): 30 mL via ORAL
  Filled 2022-09-19 (×15): qty 30

## 2022-09-19 MED ORDER — HYDROMORPHONE HCL 2 MG PO TABS
4.0000 mg | ORAL_TABLET | Freq: Three times a day (TID) | ORAL | Status: DC
  Administered 2022-09-19 – 2022-09-21 (×6): 4 mg via ORAL
  Filled 2022-09-19 (×9): qty 2

## 2022-09-19 MED ORDER — HYDROMORPHONE HCL 1 MG/ML IJ SOLN
1.0000 mg | Freq: Four times a day (QID) | INTRAMUSCULAR | Status: DC | PRN
  Administered 2022-09-20 – 2022-09-22 (×2): 1 mg via INTRAVENOUS
  Filled 2022-09-19 (×2): qty 1

## 2022-09-19 NOTE — Progress Notes (Signed)
OT Cancellation Note  Patient Details Name: Christian Dalton MRN: [id number] DOB: [birth year]   Cancelled Treatment:    Reason Eval/Treat Not Completed: Patient declined, no reason specified Patient adamantly refusing therapy, stating he wants to wait until he gets to SNF in order to start therapy. OT providing education to patient and family on the importance of early mobility, with patient acknowledging but continuing to refuse. OT will continue to attempt to provide treatment.   Corinne Ports E. Katalaya Beel, OTR/L Acute Rehabilitation Services [phone]   Ascencion Dike 09/19/2022, 12:10 PM

## 2022-09-19 NOTE — Progress Notes (Signed)
Pt receives out-pt HD at [facility] on TTS. Pt arrives at 10:40 for 11:00 chair time. Will assist as needed.   [name] Renal Navigator [phone]

## 2022-09-19 NOTE — TOC Initial Note (Signed)
Transition of Care [facility]) - Initial/Assessment Note    Patient Details  Name: Christian Dalton MRN: [id number] Date of Birth: [birth year]  [facility] CM/SW Contact:    Vinie Sill, LCSW Phone Number: 09/19/2022, 10:07 AM  Clinical Narrative:                  Patient was not in the unit. - CSW spoke with patient's wife by phone. She confirmed preferred SNF vs CIR. SNF preference os [facility].  She advise patient receives HD T/TH/S. CSW explained SNF process. No other questions or concerns notes at this time.   TOC will continue to follow and assist with discharge planning.   Thurmond Butts, MSW, LCSW Clinical Social Worker    Expected Discharge Plan: Skilled Nursing Facility Barriers to Discharge: SNF Pending bed offer, Continued Medical Work up   Patient Goals and CMS Choice        Expected Discharge Plan and Services Expected Discharge Plan: [place] In-house Referral: Clinical Social Work     Living arrangements for the past 2 months: Single Family Home                                      Prior Living Arrangements/Services Living arrangements for the past 2 months: Single Family Home Lives with:: Self, Spouse          Need for Family Participation in Patient Care: Yes (Comment) Care giver support system in place?: Yes (comment)      Activities of Daily Living Home Assistive Devices/Equipment: Walker (specify type) ADL Screening (condition at time of admission) Patient's cognitive ability adequate to safely complete daily activities?: No Is the patient deaf or have difficulty hearing?: Yes Does the patient have difficulty seeing, even when wearing glasses/contacts?: No Does the patient have difficulty concentrating, remembering, or making decisions?: No Patient able to express need for assistance with ADLs?: Yes Does the patient have difficulty dressing or bathing?: No Independently performs ADLs?: Yes (appropriate for  developmental age) Does the patient have difficulty walking or climbing stairs?: Yes Weakness of Legs: Both Weakness of Arms/Hands: Both  Permission Sought/Granted                  Emotional Assessment       Orientation: : Oriented to Self, Oriented to Place, Oriented to  Time, Oriented to Situation Alcohol / Substance Use: Not Applicable Psych Involvement: No (comment)  Admission diagnosis:  Hyperkalemia [E87.5] Pre-op testing [Z01.818] ESRD on hemodialysis ([place]) [N18.6, Z99.2] Gangrene due to peripheral vascular disease ([place]) [I73.9] Gangrene of both feet ([place]) [I96] Patient Active Problem List   Diagnosis Date Noted   Gangrene of both feet ([place]) 09/15/2022   Mitral regurgitation 06/03/2022   Hyperkalemia 06/02/2022   PAD (peripheral artery disease) ([place]) 06/02/2022   Gangrene of toe of left foot ([place]) 06/02/2022   COPD (chronic obstructive pulmonary disease) ([place]) 06/02/2022   HFrEF (heart failure with reduced ejection fraction) ([place]) 06/02/2022   HLD (hyperlipidemia) 04/07/2019   Hypothyroidism 04/07/2019   Depression 04/07/2019   ESRD (end stage renal disease) on dialysis ([place]) 08/17/2018   Type II diabetes mellitus with renal manifestations ([place]) 08/17/2018   HTN (hypertension) 08/17/2018   CAD (coronary artery disease) 08/17/2018   Obesity, Class III, BMI 40-49.9 (morbid obesity) ([place]) 08/17/2018   PCP:  Maris Berger, MD Pharmacy:   [place], [place] -  [address] [place] [address] Phone: [phone] Fax: [phone]     Social Determinants of Health (SDOH) Interventions    Readmission Risk Interventions     No data to display

## 2022-09-19 NOTE — Care Management Important Message (Signed)
Important Message  Patient Details  Name: Christian Dalton MRN: [id number] Date of Birth: Jun 26, 1941   Medicare Important Message Given:  Yes     Christian Dalton 09/19/2022, 7:57 AM

## 2022-09-19 NOTE — Progress Notes (Signed)
Lower extremity arterial duplex has been completed.   Preliminary results in CV Proc.   Christian Dalton 09/19/2022 11:43 AM

## 2022-09-19 NOTE — Progress Notes (Signed)
Subjective: The patient reports persistent RLE pain. He denies any further shortness of breath today. He was weened off of nasal cannula and is back on room air. He is eating and drinking, and denies nausea, vomiting, and abdominal pain. He has not had a bowel movement yet, but is passing gas.  Objective:  Vital signs in last 24 hours: Vitals:   09/18/22 2003 09/18/22 2316 09/19/22 0418 09/19/22 0830  BP: (!) 112/54 (!) 108/59 (!) 114/53 117/60  Pulse: 100 89 86 92  Resp: '18 17 17 17  '$ Temp: 98.3 F (36.8 C) 97.6 F (36.4 C) 97.9 F (36.6 C) 98.3 F (36.8 C)  TempSrc: Oral Oral Oral Oral  SpO2: 100% 94% 100% 96%  Weight:      Height:       Weight change: -0.768 kg  Intake/Output Summary (Last 24 hours) at 09/19/2022 1008 Last data filed at 09/18/2022 2000 Gross per 24 hour  Intake 240 ml  Output 2400 ml  Net -2160 ml   General: Well-appearing; appears to be resting comfortably in bed; No acute distress Cardiovascular: Regular rate and rhythm; No murmurs, rubs, or gallops; 2+ radial pulses; 1+ left dp pulse; LUE AV fistula covered with gauze post-HD Pulmonary: No signs of respiratory distress; Lungs clear to auscultation bilaterally; No wheezes  Abdominal: Non-tender to palpation in all quadrants; Soft, non-distended, no masses or organomegaly  Extremities:  RLE: R AKA intact with staples; clean and dry; no swelling, erythema, bleeding, or drainage  LLE: Warm; 1+ dp pulse; Left foot with stable dry gangrene of 1st and 2nd toes     Assessment/Plan:  Principal Problem:   Gangrene of both feet (HCC) Active Problems:   ESRD (end stage renal disease) on dialysis (HCC)   Type II diabetes mellitus with renal manifestations (HCC)   HTN (hypertension)   HLD (hyperlipidemia)   COPD (chronic obstructive pulmonary disease) (HCC)   HFrEF (heart failure with reduced ejection fraction) (HCC)   Christian Dalton is a 81 y.o. male with past medical history of ESRD on TTS HD, T2DM,  HTN, CAD, HLD, hypothyroidism, depression, COPD, HFrEF, and peripheral artery disease presenting with worsening bilateral foot ischemic necrosis, status post right AKA performed Tuesday, 10/24.    #Bilateral foot ischemic necrosis  The patient 's right AKA appears to be healing well with no complications, however, he has persistent pain of his RLE that is not controlled on the Dilaudid 1 mg PRN. Plan to increase to Dilaudid 4 mg TID for better pain control in the setting of likely opioid tolerance. The patient is stable for discharge to SNF and VVS recommends a 4 week follow-up for LLE.     - Dilaudid 4 mg TID for pain control - Aspirin '81mg'$  and atorvastatin '80mg'$  daily  - Dispo to SNF for rehabilitation - Heparin TID for VTE prophylaxis  #Left toe 1-2 necrosis The patient's LLE is warmer today and a 1+ dp pulse could be appreciated on exam, which is reassuring. Awaiting doppler US results.  #Hypotension The patient is tolerating more PO and his BP has improved to 117/60. He takes midodrine on dialysis days, T/Th/Sat, so will hold today and see how his blood pressure responds.   #T2DM A1c 6.0 earlier this month. His fasting blood glucose this morning was decreased to 69, so will follow-up after his next meal and reassess.  -SSI q4 post op     #ESRD on hemodialysis  Per nephrology. The patient was dialyzed yesterday, 10/26. Will order renal  function panel to ensure his electrolytes remain WNL.    - Renal diet  - Midodrine 10 TID with meals  - Renvela 800 BID with meals      #COPD The patient was weened off of nasal cannula, and his respiratory status is now stable with SpO2 100% on RA. -continue singulair '10mg'$  QHS, albuterol nebs q6h prn   #Constipation  The patient has not yet had a bowel movement, but is passing gas. Will increase lactulose from qhs to BID.  - Lactulose 10 g BID for constipation      LOS: 4 days   Christian Dalton, Medical Student 09/19/2022, 10:08 AM

## 2022-09-19 NOTE — Progress Notes (Addendum)
[facility] Progress Note   Subjective: Seen in room. Had HD yesterday and was "miserable" d/t stump pain. Didn't sleep well. No new complaints this am. No cp, dyspnea.   Objective Vitals:   09/18/22 2003 09/18/22 2316 09/19/22 0418 09/19/22 0830  BP: (!) 112/54 (!) 108/59 (!) 114/53 117/60  Pulse: 100 89 86 92  Resp: '18 17 17 17  '$ Temp: 98.3 F (36.8 C) 97.6 F (36.4 C) 97.9 F (36.6 C) 98.3 F (36.8 C)  TempSrc: Oral Oral Oral Oral  SpO2: 100% 94% 100% 96%  Weight:      Height:       Physical Exam General: alert, lying in bed, nad  Heart: RRR, no mrg Lungs: CTAB, nml WOB on RA Abdomen:  soft, NTND Extremities: Trace edema on LLE, R AKA dressed, 1-2+ UE edema Dialysis Access: LU AVF w/chronic ulcers +b   Filed Weights   09/18/22 0530 09/18/22 1447 09/18/22 1835  Weight: 89.3 kg 88.5 kg 86.1 kg    Intake/Output Summary (Last 24 hours) at 09/19/2022 0954 Last data filed at 09/18/2022 2000 Gross per 24 hour  Intake 240 ml  Output 2400 ml  Net -2160 ml     Additional Objective Labs: Basic Metabolic Panel: Recent Labs  Lab 09/17/22 0235 09/18/22 0152 09/19/22 0747  NA 133* 132* 134*  K 4.8 5.4* 4.8  CL 94* 93* 94*  CO2 '26 24 26  '$ GLUCOSE 75 86 69*  BUN 48* 60* 47*  CREATININE 6.22* 6.96* 5.83*  CALCIUM 9.2 9.0 8.9  PHOS 4.9* 5.6* 5.0*    Liver Function Tests: Recent Labs  Lab 09/15/22 1155 09/19/22 0747  AST 9*  --   ALT 8  --   ALKPHOS 231*  --   BILITOT 0.6  --   PROT 5.4*  --   ALBUMIN 2.1* 1.8*    CBC: Recent Labs  Lab 09/15/22 1155 09/16/22 1259 09/17/22 0235 09/18/22 0140 09/18/22 0152 09/19/22 0747  WBC 21.7*  --  20.7* 19.6* 19.4* 17.1*  NEUTROABS 19.5*  --   --  16.6*  --  14.4*  HGB 11.2*   < > 9.9* 10.2* 10.2* 9.1*  HCT 36.0*   < > 31.2* 32.7* 32.9* 29.4*  MCV 88.5  --  85.5 87.0 86.6 86.0  PLT 303  --  318 359 338 353   < > = values in this interval not displayed.    Blood Culture    Component Value  Date/Time   SDES BLOOD SITE NOT SPECIFIED 09/15/2022 1510   SPECREQUEST  09/15/2022 1510    BOTTLES DRAWN AEROBIC AND ANAEROBIC Blood Culture results may not be optimal due to an excessive volume of blood received in culture bottles   CULT  09/15/2022 1510    NO GROWTH 4 DAYS Performed at [facility], [place] [address] [place], [place] [address]    REPTSTATUS PENDING 09/15/2022 1510    CBG: Recent Labs  Lab 09/15/22 1520 09/16/22 1224 09/16/22 1256 09/16/22 1454 09/16/22 1514  GLUCAP 102* 53* 112* 67* 118*    Medications:  magnesium sulfate bolus IVPB      aspirin EC  81 mg Oral Daily   atorvastatin  10 mg Oral Daily   buPROPion  150 mg Oral Daily   calcitRIOL  1.75 mcg Oral Q T,Th,Sa-HD   Chlorhexidine Gluconate Cloth  6 each Topical Q0600   docusate sodium  100 mg Oral Daily   escitalopram  10 mg Oral QHS  heparin  5,000 Units Subcutaneous Q8H   HYDROmorphone  4 mg Oral TID   insulin aspart  0-5 Units Subcutaneous QHS   insulin aspart  0-9 Units Subcutaneous TID WC   lactulose  10 g Oral QPM   levothyroxine  125 mcg Oral Q0600   linaclotide  290 mcg Oral QPM   midodrine  10 mg Oral Once per day on Tue Thu Sat   montelukast  10 mg Oral QHS   sevelamer carbonate  2,400 mg Oral BID WC    Dialysis Orders: TTS - [facility]  3 hrs 45 min, BFR 425, DFR 800,  EDW 88.9 kg, 2 K/ 2 Ca   Access: LUE AVF  No Heparin  Mircera 50 mcg q2wks - last 10/19 Calcitriol 1.75 mcg PO qHD Venofer qweek      Assessment/Plan:  Gangrenous LE Wound infection - Bilateral lower extremity gangrenous wounds. R AKA 10/24 by Dr. Donzetta Matters. Deciding on rehab placement.  PT recommended CIR. Hyperkalemia -  Resolved with HD.  Reminded to follow low K diet.  Renal diet ordered.   Chronic ulcer on AVF - Looks a little better today.  No spontaneous bleeding. Stick away from.  ESRD on HD TTS - Back on schedule. Next HD Sat.   Hypertension/volume  - BP in goal. UF limited by hypotension on HD.    Continue to hold home meds (lisinopril 2.'5mg'$  qd, carvedilol 3.'125mg'$  qd).  Given albumin and midodrine with HD.  Now on midodrine '10mg'$  TID.  Continue titrate down volume as tolerated.   Anemia of CKD - Hgb 10.2> 9.1. post surgery. Receives venofer every week - hold d/t infection. Continue to monitor. Transfuse for Hgb <7.  Next ESA due 11/2.   Secondary Hyperparathyroidism -  Ca 9.4, phos close to goal. Continue VDRA and binders -  Renvela 3AC TID.  Nutrition - Renal diet w/fluid restrictions. Add protein supp for low albumin.  A fib DM - per PMD  Lynnda Child PA-C [facility] 09/19/2022,9:54 AM   Seen and examined independently.  Agree with note and exam as documented above by physician extender and as noted here.  General adult male in bed in no acute distress HEENT normocephalic atraumatic extraocular movements intact sclera anicteric Neck supple trachea midline Lungs clear to auscultation bilaterally normal work of breathing at rest  Heart S1S2 no rub Abdomen soft nontender nondistended Extremities 1+ edema left leg; right AKA Psych normal mood and affect Neuro - alert and oriented; very hard of hearing Access LUE AVF bandaged. bruit and thrill  Gangrenous lower extremity wounds - s/p right AKA on 10/24 with vascular   Hyperkalemia - low K diet. On HD   Chronic ulcer on AVF - as above - stick away from the lesion; VVS is seeing him for the PAD s/p AKA     ESRD - HD per TTS schedule    Hypotension - home meds are off.  on midodrine pre-HD which we may be able to stop soon   Anemia CKD - ESA recently dosed outpatient   Secondary hyperparathyroidism - continue binders.  on calcitriol (please do not include on discharge med list)   Disposition - for placement at SNF likely.  I let HD SW know    Claudia Desanctis, MD 09/19/2022 11:36 AM

## 2022-09-19 NOTE — Progress Notes (Signed)
Orthopedic Tech Progress Note Patient Details:  Christian Dalton 1941-09-29 [id number] [facility]K retention sock Patient ID: Christian Dalton, male   DOB: 1941/09/23, 81 y.o.   MRN: [id number]  Christian Dalton 09/19/2022, 9:19 AM

## 2022-09-19 NOTE — Progress Notes (Addendum)
[facility]  Subjective  - No new complaints   Objective (!) 114/53 86 97.9 F (36.6 C) (Oral) 17 100%  Intake/Output Summary (Last 24 hours) at 09/19/2022 0731 Last data filed at 09/18/2022 2000 Gross per 24 hour  Intake 240 ml  Output 2400 ml  Net -2160 ml    Right AKA healing well    Stump is viable without ischemic skin changes Left foot with dry gangrene without change   Assessment/Planning: POD # 3 right AKA  Left foot with first and second toes dry gangrene pending demarcation verse left LE amputation.  Comfortable.   Roxy Horseman 09/19/2022 7:31 AM --  Laboratory Lab Results: Recent Labs    09/18/22 0140 09/18/22 0152  WBC 19.6* 19.4*  HGB 10.2* 10.2*  HCT 32.7* 32.9*  PLT 359 338   BMET Recent Labs    09/17/22 0235 09/18/22 0152  NA 133* 132*  K 4.8 5.4*  CL 94* 93*  CO2 26 24  GLUCOSE 75 86  BUN 48* 60*  CREATININE 6.22* 6.96*  CALCIUM 9.2 9.0    COAG Lab Results  Component Value Date   INR 1.2 09/09/2022   INR 1.2 09/08/2022   INR 1.4 (H) 05/30/2022   No results found for: "PTT"   I have independently interviewed and examined patient and agree with PA assessment and plan above.   Mikhaela Zaugg C. Donzetta Matters, MD [facility] Office: [phone] Pager: [phone]

## 2022-09-19 NOTE — NC FL2 (Signed)
[place] MEDICAID FL2 LEVEL OF CARE SCREENING TOOL     IDENTIFICATION  Patient Name: Christian Dalton Birthdate: 1941/11/10 Sex: male Admission Date (Current Location): 09/15/2022  [facility] and [place] Number:  Herbalist and Address:  [facility] [place] [place] [address], [place], [place] [address]      Provider Number: [id number]  Attending Physician Name and Address:  Axel Filler, *  Relative Name and Phone Number:       Current Level of Care: Hospital Recommended Level of Care: [place] Prior Approval Number:    Date Approved/Denied:   PASRR Number: [id number]  Discharge Plan: SNF    Current Diagnoses: Patient Active Problem List   Diagnosis Date Noted   Gangrene of both feet ([place]) 09/15/2022   Mitral regurgitation 06/03/2022   Hyperkalemia 06/02/2022   PAD (peripheral artery disease) ([place]) 06/02/2022   Gangrene of toe of left foot ([place]) 06/02/2022   COPD (chronic obstructive pulmonary disease) ([place]) 06/02/2022   HFrEF (heart failure with reduced ejection fraction) ([place]) 06/02/2022   HLD (hyperlipidemia) 04/07/2019   Hypothyroidism 04/07/2019   Depression 04/07/2019   ESRD (end stage renal disease) on dialysis ([place]) 08/17/2018   Type II diabetes mellitus with renal manifestations ([place]) 08/17/2018   HTN (hypertension) 08/17/2018   CAD (coronary artery disease) 08/17/2018   Obesity, Class III, BMI 40-49.9 (morbid obesity) ([place]) 08/17/2018    Orientation RESPIRATION BLADDER Height & Weight     Self, Time, Situation, Place  Normal Continent Weight: 189 lb 13.1 oz (86.1 kg) Height:  '5\' 10"'$  (177.8 cm)  BEHAVIORAL SYMPTOMS/MOOD NEUROLOGICAL BOWEL NUTRITION STATUS      Continent Diet (Please see discharge summary)  AMBULATORY STATUS COMMUNICATION OF NEEDS Skin   Extensive Assist Verbally Other (Comment) (Ecchymosis,arm,bilateral,non-tenting,Wound Incision LDAs,Wound Incision open or dehiced non-pressure wound  toe,anterior,L,lateral)                       Personal Care Assistance Level of Assistance  Bathing, Feeding, Dressing Bathing Assistance: Maximum assistance Feeding assistance: Independent Dressing Assistance: Maximum assistance     Functional Limitations Info  Sight, Hearing, Speech Sight Info: Impaired Hearing Info: Impaired Speech Info: Adequate    SPECIAL CARE FACTORS FREQUENCY  PT (By licensed PT), OT (By licensed OT)     PT Frequency: 5x min weekly OT Frequency: 5x min weekly            Contractures Contractures Info: Not present    Additional Factors Info  Code Status, Allergies, Psychotropic, Insulin Sliding Scale Code Status Info: FULL Allergies Info: Avelox (moxifloxacin Hcl),Gabapentin,Metoprolol,Quinolones,Shellfish Allergy,Sulfa Antibiotics,Tetracyclines & Related,Adhesive (tape) Psychotropic Info: buPROPion (WELLBUTRIN XL) 24 hr tablet 150 mgv daily,escitalopram (LEXAPRO) tablet 10 mg daily at bedtime Insulin Sliding Scale Info: insulin aspart (novoLOG) injection 0-5 Units daily at bedtime,insulin aspart (novoLOG) injection 0-9 Units 3 times daily with meals       Current Medications (09/19/2022):  This is the current hospital active medication list Current Facility-Administered Medications  Medication Dose Route Frequency Provider Last Rate Last Admin   (feeding supplement) PROSource Plus liquid 30 mL  30 mL Oral BID BM Lynnda Child, PA-C   30 mL at 09/19/22 1334   albuterol (PROVENTIL) (2.5 MG/3ML) 0.083% nebulizer solution 2.5 mg  2.5 mg Inhalation Q6H PRN Rhyne, Samantha J, PA-C       aspirin EC tablet 81 mg  81 mg Oral Daily Atway, Rayann N, DO   81 mg at 09/19/22 7902  atorvastatin (LIPITOR) tablet 10 mg  10 mg Oral Daily Rhyne, Hulen Shouts, PA-C   10 mg at 09/19/22 0851   buPROPion (WELLBUTRIN XL) 24 hr tablet 150 mg  150 mg Oral Daily Gabriel Earing, PA-C   150 mg at 09/19/22 6712   calcitRIOL (ROCALTROL) capsule 1.75 mcg  1.75 mcg  Oral Q T,Th,Sa-HD Claudia Desanctis, MD       Chlorhexidine Gluconate Cloth 2 % PADS 6 each  6 each Topical Q0600 Penninger, Ria Comment, [place]   6 each at 09/19/22 0528   docusate sodium (COLACE) capsule 100 mg  100 mg Oral Daily Gabriel Earing, PA-C   100 mg at 09/19/22 0851   escitalopram (LEXAPRO) tablet 10 mg  10 mg Oral QHS Rhyne, Samantha J, PA-C   10 mg at 09/18/22 2130   heparin injection 5,000 Units  5,000 Units Subcutaneous Q8H Atway, Rayann N, DO   5,000 Units at 09/19/22 1334   HYDROmorphone (DILAUDID) injection 1 mg  1 mg Intravenous Q6H PRN Linus Galas, MD       HYDROmorphone (DILAUDID) tablet 4 mg  4 mg Oral TID Linus Galas, MD   4 mg at 09/19/22 1102   insulin aspart (novoLOG) injection 0-5 Units  0-5 Units Subcutaneous QHS Sridharan, Sriramkumar, MD       insulin aspart (novoLOG) injection 0-9 Units  0-9 Units Subcutaneous TID WC Sridharan, Sriramkumar, MD       lactulose ([place]) 10 GM/15ML solution 10 g  10 g Oral BID Linus Galas, MD       levothyroxine (SYNTHROID) tablet 125 mcg  125 mcg Oral Q0600 Gabriel Earing, PA-C   125 mcg at 09/19/22 0631   linaclotide (LINZESS) capsule 290 mcg  290 mcg Oral QPM Rhyne, Hulen Shouts, PA-C   290 mcg at 09/17/22 1631   magnesium sulfate IVPB 2 g 50 mL  2 g Intravenous Daily PRN Rhyne, Samantha J, PA-C       midodrine (PROAMATINE) tablet 10 mg  10 mg Oral Once per day on Tue Thu Sat Claudia Desanctis, MD   10 mg at 09/18/22 0524   montelukast (SINGULAIR) tablet 10 mg  10 mg Oral QHS Rhyne, Samantha J, PA-C   10 mg at 09/18/22 2130   ondansetron (ZOFRAN) injection 4 mg  4 mg Intravenous Q6H PRN Rhyne, Samantha J, PA-C   4 mg at 09/18/22 0931   sevelamer carbonate (RENVELA) tablet 2,400 mg  2,400 mg Oral BID WC Claudia Desanctis, MD   2,400 mg at 09/19/22 4580     Discharge Medications: Please see discharge summary for a list of discharge medications.  Relevant Imaging Results:  Relevant Lab  Results:   Additional Information [phone], HD [place] arrive at 10:40am for 11:00am chair time  Milas Gain, LCSWA

## 2022-09-19 NOTE — TOC Progression Note (Addendum)
Transition of Care [facility]) - Progression Note    Patient Details  Name: Christian Dalton MRN: [id number] Date of Birth: [birth year]  Transition of Care [facility]) CM/SW [place], [place] Phone Number: 09/19/2022, 1:48 PM  Clinical Narrative:     Update-4:35pmOlivia Dalton with [facility] confirmed unable to offer SNF bed , do not take HD patient. CSW Lvm for [facility]. CSW awaiting callback.  Christian Dalton with [facility] returned [place] call. Christian Dalton informed CSW unable to offer SNF bed for patient. CSW informed patient. Patient requested for CSW to follow up with his spouse Christian Dalton regarding SNF placement. CSW spoke with patients spouse and informed her [facility] unable to offer. Patients spouse gave CSW permission to fax out initial referral to [facility] rehab and [facility]. CSW faxed out initial referral to requested facilities. CSW will continue to follow.   CSW LVM with Christian Dalton from [facility]. CSW awaiting callback to see if they can offer SNF bed for patient. CSW will continue to follow and assist with patients dc planning needs.  Expected Discharge Plan: [place] Barriers to Discharge: SNF Pending bed offer, Continued Medical Work up  Expected Discharge Plan and Services Expected Discharge Plan: [place] In-house Referral: Clinical Social Work     Living arrangements for the past 2 months: Single Family Home                                       Social Determinants of Health (SDOH) Interventions    Readmission Risk Interventions     No data to display

## 2022-09-20 DIAGNOSIS — I96 Gangrene, not elsewhere classified: Secondary | ICD-10-CM | POA: Diagnosis not present

## 2022-09-20 DIAGNOSIS — R410 Disorientation, unspecified: Secondary | ICD-10-CM | POA: Diagnosis not present

## 2022-09-20 LAB — CULTURE, BLOOD (ROUTINE X 2)
Culture: NO GROWTH
Culture: NO GROWTH

## 2022-09-20 LAB — CBC
HCT: 28.8 % — ABNORMAL LOW (ref 39.0–52.0)
Hemoglobin: 9 g/dL — ABNORMAL LOW (ref 13.0–17.0)
MCH: 26.8 pg (ref 26.0–34.0)
MCHC: 31.3 g/dL (ref 30.0–36.0)
MCV: 85.7 fL (ref 80.0–100.0)
Platelets: 332 10*3/uL (ref 150–400)
RBC: 3.36 MIL/uL — ABNORMAL LOW (ref 4.22–5.81)
RDW: 17.8 % — ABNORMAL HIGH (ref 11.5–15.5)
WBC: 16.4 10*3/uL — ABNORMAL HIGH (ref 4.0–10.5)
nRBC: 0 % (ref 0.0–0.2)

## 2022-09-20 LAB — RENAL FUNCTION PANEL
Albumin: 1.8 g/dL — ABNORMAL LOW (ref 3.5–5.0)
Anion gap: 16 — ABNORMAL HIGH (ref 5–15)
BUN: 61 mg/dL — ABNORMAL HIGH (ref 8–23)
CO2: 26 mmol/L (ref 22–32)
Calcium: 8.9 mg/dL (ref 8.9–10.3)
Chloride: 92 mmol/L — ABNORMAL LOW (ref 98–111)
Creatinine, Ser: 6.71 mg/dL — ABNORMAL HIGH (ref 0.61–1.24)
GFR, Estimated: 8 mL/min — ABNORMAL LOW (ref >60)
Glucose, Bld: 117 mg/dL — ABNORMAL HIGH (ref 70–99)
Phosphorus: 4.8 mg/dL — ABNORMAL HIGH (ref 2.5–4.6)
Potassium: 5 mmol/L (ref 3.5–5.1)
Sodium: 134 mmol/L — ABNORMAL LOW (ref 135–145)

## 2022-09-20 LAB — GLUCOSE, CAPILLARY
Glucose-Capillary: 49 mg/dL — ABNORMAL LOW (ref 70–99)
Glucose-Capillary: 126 mg/dL — ABNORMAL HIGH (ref 70–99)
Glucose-Capillary: 85 mg/dL (ref 70–99)

## 2022-09-20 MED ORDER — GLUCOSE 40 % PO GEL: 2.0000 | ORAL | Status: AC

## 2022-09-20 MED ORDER — DEXTROSE 50 % IV SOLN
INTRAVENOUS | Status: AC
  Administered 2022-09-20: 50 mL
  Filled 2022-09-20: qty 50

## 2022-09-20 NOTE — Progress Notes (Signed)
Received patient in bed to unit.  Alert and oriented with period of confusion. Informed consent signed and in chart.   Treatment initiated: [place] Treatment completed: 1127  Patient tolerated well.  Transported back to the room  Alert, without acute distress. Dressing c/d/I. Hand-off given to patient's nurse. [employer]  Access used: AVF Access issues: none  Total UF removed: 2000 ml Medication(s) given: calcitrol Post HD VS: 99.0, 90, 18, 130/60, 100% RA  Post HD weight: 85 Kg   [facility]

## 2022-09-20 NOTE — Progress Notes (Signed)
Subjective:  The patient reports persistent RLE pain and fatigue while receiving HD today. He has not had a bowel movement. Denies chest pain and shortness of breath.   Objective:  Vital signs in last 24 hours: Vitals:   09/20/22 0930 09/20/22 1000 09/20/22 1030 09/20/22 1100  BP: 130/60 122/69 (!) 135/59 133/62  Pulse: 67 (!) 25 95 95  Resp: 17 (!) '21 15 17  '$ Temp:      TempSrc:      SpO2: 96% 100% 96% 95%  Weight:      Height:       Weight change: -1.727 kg  Intake/Output Summary (Last 24 hours) at 09/20/2022 1123 Last data filed at 09/20/2022 0359 Gross per 24 hour  Intake 460 ml  Output 0 ml  Net 460 ml   General: Well-appearing; appears to be resting comfortably in bed receiving HD; No acute distress Cardiovascular: Regular rate and rhythm; No murmurs, rubs, or gallops; 1+ left dp pulse  Pulmonary: No signs of respiratory distress; Lungs clear to auscultation bilaterally; No wheezes, rales, or rhonchi  Extremities:  RLE:  R AKA intact with staples; clean and dry; no swelling, erythema, bleeding, or drainage  LLE:  Warm; 1+ dp pulse    Assessment/Plan:  Principal Problem:   Gangrene of both feet (HCC) Active Problems:   ESRD (end stage renal disease) on dialysis (HCC)   Type II diabetes mellitus with renal manifestations (HCC)   HTN (hypertension)   HLD (hyperlipidemia)   COPD (chronic obstructive pulmonary disease) (HCC)   HFrEF (heart failure with reduced ejection fraction) (HCC)   Christian Dalton is a 81 y.o. male with past medical history of ESRD on T/Th/S HD, T2DM, HTN, CAD, HLD, hypothyroidism, depression, COPD, HFrEF, and peripheral artery disease presenting with worsening bilateral foot ischemic necrosis, status post right AKA performed Tuesday, 10/24.   #Bilateral foot ischemic necrosis  The patient reports persistent RLE pain despite significant dilaudid increase yesterday. He will likely have some baseline pain due to opioid tolerance from prior use;  however, he has not received his morning dose of Dilaudid due to HD. His right AKA wound appears to be healing well. His WBC continues to decrease, 16.4 today, down from 17.1 yesterday. He has been denied from [facility] SNF due to HD status. Social work is looking into [facility] and [facility]. Discharge pending SNF placement.   - Dilaudid 4 mg TID for pain control - Aspirin '81mg'$  and atorvastatin '80mg'$  daily  - Dispo to SNF for rehabilitation - Heparin TID for VTE prophylaxis  #Left toe 1-2 necrosis Vas LLE US showed patent left lower extremity with no obvious evidence of stenosis. He LLE today is warm with 1+ dp pulse. Will follow VVS recommendations for follow-up in 4 weeks.    #Hypotension The patient is normotensive at 130/60 after receiving midodrine and HD today. Will continue to monitor.    #T2DM # hypoglycemia  A1c 6.0 earlier this month. His fasting blood glucose this morning was decreased to 49, and increased to 126 following administration of D50 50 mL. He has not received any prn sliding scale insulin since admission, so this is likely due to poor PO intake. Will encourage more PO intake and discontinue SSI.  - Discontinue SSI - D50 prn for hypoglycemia  - Continuous blood glucose monitoring      #ESRD on hemodialysis  Per nephrology. The patient was dialyzed today. Will continue to monitor BMP.  - Renal diet  - Midodrine 10 TID  with meals  - Renvela 800 BID with meals      #COPD Respiratory status remains stable on RA, and patient denies any further shortness of breath.  - Continue singulair '10mg'$  QHS, albuterol nebs q6h prn     #Constipation  The patient has not yet had a bowel movement, but is passing gas. Will continue lactulose BID.  - Lactulose 10 g BID for constipation    LOS: 5 days   Paulo Fruit, Medical Student 09/20/2022, 11:23 AM

## 2022-09-20 NOTE — Progress Notes (Addendum)
[facility] Progress Note   Subjective:  Seen in [facility] - on HD. UF goal 2L. Using AVF. No new events overnight. He wants to know how much time he has left on dialysis  Objective Vitals:   09/20/22 0740 09/20/22 0746 09/20/22 0800 09/20/22 0830  BP: 105/65 (!) 120/54 (!) 110/50 113/66  Pulse: 91 84 89 (!) 50  Resp: '16 16 16 14  '$ Temp: 98.1 F (36.7 C)     TempSrc: Oral     SpO2: 100% 100% 100% (!) 89%  Weight: 87.4 kg     Height:       Physical Exam General: alert, lying in bed, nad  Heart: RRR, no mrg Lungs: CTAB, nml WOB on RA Abdomen:  soft, NTND Extremities:  R AKA trace edema  Dialysis Access: LU AVF w/chronic ulcers +b   Filed Weights   09/18/22 1835 09/20/22 0358 09/20/22 0740  Weight: 86.1 kg 86.8 kg 87.4 kg    Intake/Output Summary (Last 24 hours) at 09/20/2022 0848 Last data filed at 09/20/2022 0359 Gross per 24 hour  Intake 1534 ml  Output 500 ml  Net 1034 ml     Additional Objective Labs: Basic Metabolic Panel: Recent Labs  Lab 09/18/22 0152 09/19/22 0747 09/20/22 0645  NA 132* 134* 134*  K 5.4* 4.8 5.0  CL 93* 94* 92*  CO2 '24 26 26  '$ GLUCOSE 86 69* 117*  BUN 60* 47* 61*  CREATININE 6.96* 5.83* 6.71*  CALCIUM 9.0 8.9 8.9  PHOS 5.6* 5.0* 4.8*    Liver Function Tests: Recent Labs  Lab 09/15/22 1155 09/19/22 0747 09/20/22 0645  AST 9*  --   --   ALT 8  --   --   ALKPHOS 231*  --   --   BILITOT 0.6  --   --   PROT 5.4*  --   --   ALBUMIN 2.1* 1.8* 1.8*    CBC: Recent Labs  Lab 09/15/22 1155 09/16/22 1259 09/17/22 0235 09/18/22 0140 09/18/22 0152 09/19/22 0747 09/20/22 0645  WBC 21.7*  --  20.7* 19.6* 19.4* 17.1* 16.4*  NEUTROABS 19.5*  --   --  16.6*  --  14.4*  --   HGB 11.2*   < > 9.9* 10.2* 10.2* 9.1* 9.0*  HCT 36.0*   < > 31.2* 32.7* 32.9* 29.4* 28.8*  MCV 88.5  --  85.5 87.0 86.6 86.0 85.7  PLT 303  --  318 359 338 353 332   < > = values in this interval not displayed.    Blood Culture    Component  Value Date/Time   SDES BLOOD SITE NOT SPECIFIED 09/15/2022 1510   SPECREQUEST  09/15/2022 1510    BOTTLES DRAWN AEROBIC AND ANAEROBIC Blood Culture results may not be optimal due to an excessive volume of blood received in culture bottles   CULT  09/15/2022 1510    NO GROWTH 4 DAYS Performed at [facility], [place] [address] [place], [place] [address]    REPTSTATUS PENDING 09/15/2022 1510    CBG: Recent Labs  Lab 09/19/22 1150 09/19/22 1641 09/19/22 2120 09/20/22 0608 09/20/22 0652  GLUCAP 69* 118* 98 49* 126*    Medications:  magnesium sulfate bolus IVPB      (feeding supplement) PROSource Plus  30 mL Oral BID BM   aspirin EC  81 mg Oral Daily   atorvastatin  10 mg Oral Daily   buPROPion  150 mg Oral Daily   calcitRIOL  1.75  mcg Oral Q T,Th,Sa-HD   Chlorhexidine Gluconate Cloth  6 each Topical Q0600   dextrose  2 Tube Oral STAT   docusate sodium  100 mg Oral Daily   escitalopram  10 mg Oral QHS   heparin  5,000 Units Subcutaneous Q8H   HYDROmorphone  4 mg Oral TID   insulin aspart  0-5 Units Subcutaneous QHS   insulin aspart  0-9 Units Subcutaneous TID WC   lactulose  10 g Oral BID   levothyroxine  125 mcg Oral Q0600   linaclotide  290 mcg Oral QPM   midodrine  10 mg Oral Once per day on Tue Thu Sat   montelukast  10 mg Oral QHS   sevelamer carbonate  2,400 mg Oral BID WC    Dialysis Orders: TTS - [facility]  3 hrs 45 min, BFR 425, DFR 800,  EDW 88.9 kg, 2 K/ 2 Ca   Access: LUE AVF  No Heparin  Mircera 50 mcg q2wks - last 10/19 Calcitriol 1.75 mcg PO qHD Venofer qweek      Assessment/Plan: Gangrenous LE Wound infection - Bilateral lower extremity gangrenous wounds. R AKA 10/24 by Dr. Donzetta Matters. Per VVS  Hyperkalemia -  Resolved with HD.  Reminded to follow low K diet.  Renal diet ordered.   Chronic ulcer on AVF - Looks a little better today.  No spontaneous bleeding. Stick away from. ESRD on HD TTS - Back on schedule. Next HD Sat.  Hypertension/volume  - BP in  goal. UF limited by hypotension on HD.   Continue to hold home meds (lisinopril 2.'5mg'$  qd, carvedilol 3.'125mg'$  qd).  Given albumin and midodrine with HD.  Now on midodrine '10mg'$  TID.  Continue titrate down volume as tolerated.  Anemia of CKD - Hgb 10.2> 9.1. post surgery. Receives venofer every week - hold d/t infection. Continue to monitor. Transfuse for Hgb <7.  Next ESA due 11/2.  Secondary Hyperparathyroidism -  Ca/Phos ok.  Continue VDRA and binders -  Renvela 3AC TID.  Nutrition - Renal diet w/fluid restrictions. Add protein supp for low albumin.  A fib DM - per PMD Dispo -For SNF placement  Ogechi Larina Earthly PA-C [facility] 09/20/2022,8:48 AM   Seen and examined independently.  Agree with note and exam as documented above by physician extender and as noted here.  Seen and examined on dialysis.  Blood pressure 133/62 and HR 94.  Tolerating goal.  Left AVF in use.  Procedure supervised.  He is circumferential and very hard of hearing.  He was drowsy earlier today per RN.  More alert now.   Claudia Desanctis, MD 09/20/2022 11:04 AM

## 2022-09-21 LAB — CBC
HCT: 28.2 % — ABNORMAL LOW (ref 39.0–52.0)
Hemoglobin: 9.1 g/dL — ABNORMAL LOW (ref 13.0–17.0)
MCH: 27.2 pg (ref 26.0–34.0)
MCHC: 32.3 g/dL (ref 30.0–36.0)
MCV: 84.4 fL (ref 80.0–100.0)
Platelets: 390 10*3/uL (ref 150–400)
RBC: 3.34 MIL/uL — ABNORMAL LOW (ref 4.22–5.81)
RDW: 17.7 % — ABNORMAL HIGH (ref 11.5–15.5)
WBC: 16.2 10*3/uL — ABNORMAL HIGH (ref 4.0–10.5)
nRBC: 0 % (ref 0.0–0.2)

## 2022-09-21 LAB — TROPONIN I (HIGH SENSITIVITY)
Troponin I (High Sensitivity): 51 ng/L — ABNORMAL HIGH (ref <18)
Troponin I (High Sensitivity): 59 ng/L — ABNORMAL HIGH (ref <18)

## 2022-09-21 LAB — RENAL FUNCTION PANEL
Albumin: 1.9 g/dL — ABNORMAL LOW (ref 3.5–5.0)
Anion gap: 13 (ref 5–15)
BUN: 34 mg/dL — ABNORMAL HIGH (ref 8–23)
CO2: 27 mmol/L (ref 22–32)
Calcium: 8.8 mg/dL — ABNORMAL LOW (ref 8.9–10.3)
Chloride: 96 mmol/L — ABNORMAL LOW (ref 98–111)
Creatinine, Ser: 4.59 mg/dL — ABNORMAL HIGH (ref 0.61–1.24)
GFR, Estimated: 12 mL/min — ABNORMAL LOW (ref >60)
Glucose, Bld: 73 mg/dL (ref 70–99)
Phosphorus: 3.5 mg/dL (ref 2.5–4.6)
Potassium: 4.2 mmol/L (ref 3.5–5.1)
Sodium: 136 mmol/L (ref 135–145)

## 2022-09-21 LAB — GLUCOSE, CAPILLARY
Glucose-Capillary: 100 mg/dL — ABNORMAL HIGH (ref 70–99)
Glucose-Capillary: 71 mg/dL (ref 70–99)
Glucose-Capillary: 97 mg/dL (ref 70–99)
Glucose-Capillary: 79 mg/dL (ref 70–99)

## 2022-09-21 MED ORDER — HALOPERIDOL LACTATE 5 MG/ML IJ SOLN
1.0000 mg | Freq: Four times a day (QID) | INTRAMUSCULAR | Status: DC | PRN
  Administered 2022-09-21: 1 mg via INTRAVENOUS
  Filled 2022-09-21: qty 1

## 2022-09-21 NOTE — Progress Notes (Signed)
Subjective:   Summary: Christian Dalton is a 81 y.o. year old male currently admitted on the IMTS HD#6 for RLE ischemic necrosis POD 5 s\p AKA.  Overnight Events:  -NAEON -Awaiting SNF placement -Reportedly was very aggressive and rude to staff at dialysis yesterday.  Also was combative with nursing staff this morning.  Paged about agitation and went to reassess.  He did seem more disoriented on our initial assessment and reassessment.  Reporting feeling "a little out of it".  He does seem to be redirectable, but has threatened nursing staff with violence.  He has not allowed nursing staff to give him p.o. meds this morning and has not allowed phlebotomy to collect follow-up labs from him. - Reported some vague chest discomfort on initial assessment.  On reassessment, he declined any significant dyspnea, chest pain, increasing left lower extremity pain, headaches.  Objective:  Vital signs in last 24 hours: Vitals:   09/20/22 1356 09/20/22 2024 09/21/22 0010 09/21/22 0354  BP: (!) 120/54 (!) 127/59  124/67  Pulse: 91 97 92 90  Resp: '16 19 18 18  '$ Temp: 98.7 F (37.1 C) 98.5 F (36.9 C)    TempSrc: Oral Oral  Oral  SpO2: 100% 97%  98%  Weight:      Height:       Supplemental O2: Room Air    Physical Exam:  Constitutional: Agitated but redirectable Cardiovascular: RRR, no murmurs, rubs or gallops Pulmonary/Chest: normal work of breathing on room air, lungs clear to auscultation bilaterally Extremities: - RLE/P AKA.  Stump intact with trace edema, clean dry and intact without any erythema or drainage. - LLE first and second digit similar in appearance to yesterday.  No interval increase in erythema, edema of LLE.  No drainage from LLE first and second necrotic digits.  LLE DP 1+. - LUE fistula intact with similar findings compared to prior exams.,  Stable LUE edema Neuro/psych: Alert and oriented x2.  Significantly reduced attentiveness, awareness compared to earlier  in the admission.  Seems to wax and wane.  Filed Weights   09/20/22 0358 09/20/22 0740 09/20/22 1127  Weight: 86.8 kg 87.4 kg 85 kg     Intake/Output Summary (Last 24 hours) at 09/21/2022 0701 Last data filed at 09/20/2022 1753 Gross per 24 hour  Intake 480 ml  Output 2000 ml  Net -1520 ml   Net IO Since Admission: -2,594.9 mL [09/21/22 0701]  Pertinent Labs:    Latest Ref Rng & Units 09/21/2022    1:05 AM 09/20/2022    6:45 AM 09/19/2022    7:47 AM  CBC  WBC 4.0 - 10.5 K/uL 16.2  16.4  17.1   Hemoglobin 13.0 - 17.0 g/dL 9.1  9.0  9.1   Hematocrit 39.0 - 52.0 % 28.2  28.8  29.4   Platelets 150 - 400 K/uL 390  332  353        Latest Ref Rng & Units 09/21/2022    1:05 AM 09/20/2022    6:45 AM 09/19/2022    7:47 AM  CMP  Glucose 70 - 99 mg/dL 73  117  69   BUN 8 - 23 mg/dL 34  61  47   Creatinine 0.61 - 1.24 mg/dL 4.59  6.71  5.83   Sodium 135 - 145 mmol/L 136  134  134   Potassium 3.5 - 5.1 mmol/L 4.2  5.0  4.8  Chloride 98 - 111 mmol/L 96  92  94   CO2 22 - 32 mmol/L '27  26  26   '$ Calcium 8.9 - 10.3 mg/dL 8.8  8.9  8.9   Phos 3.5, albumin 1.9  Imaging: Left lower extremity Doppler study: Patent left lower extremity with no obvious evidence of stenosis  EKG: Monitor during assessment showed possible new T wave inversions, more common PVCs.  Subsequent "twelve-lead EKG looks similar to EKG obtained at the beginning of hospitalization.  Assessment/Plan:   Principal Problem:   Gangrene of both feet ([place]) Active Problems:   ESRD (end stage renal disease) on dialysis (HCC)   Type II diabetes mellitus with renal manifestations (HCC)   HTN (hypertension)   HLD (hyperlipidemia)   COPD (chronic obstructive pulmonary disease) (HCC)   HFrEF (heart failure with reduced ejection fraction) ([place])   Patient Summary: Christian Dalton is a 81 y.o. with a pertinent PMH of  ESRD on TTS HD, T2DM, HTN, CAD, HLD, hypothyroidism, depression, COPD, HFrEF, and peripheral artery  disease , who presented with bilateral ischemic lower extremity necrosis now postop day 5 s/p right AKA.   #Bilateral foot ischemic necrosis  RLE stump healing well.  LLE necrotic digits intact without signs of infection, pulses intact on exam and on Doppler couple days ago.  Leukocytosis stable today.  He remains afebrile.  Declining p.o. meds this morning due to likely delirium.  Per vascular surgery, plan for LLE is demarcation versus amputation within 1 month, but no acute plans.  Pending discharge to SNF. - Dilaudid 4 mg TID for pain control, Dilaudid 1 mg IV every 6 hours for breakthrough pain. - Aspirin '81mg'$  and atorvastatin '80mg'$  daily  - Dispo to SNF for rehabilitation - Heparin TID for VTE prophylaxis  #Altered mental status #Hospital associated delirium Patient significantly more disoriented this morning compared to previously in the admission.  Electrolytes were stable on labs this morning, leukocytosis stable/afebrile and he does not seem to have any new signs of infection, no clear structural etiologies except for possible chest pain which we are working up as below, opioid toxicity unlikely as he has stable respiratory status and is actually declining his p.o. Dilaudid this morning.  Clinical picture seems to be consistent with delirium. He is combative against nursing and staff. - haldol q6h PRN for agitation -delirium precautions  #Chest discomfort Patient complaining of some chest discomfort this morning, unable to clearly define.  He did have some increased PVC burden on the monitor while we are in the room as well as possible new T wave inversions.  Obtained repeat twelve-lead EKG which looked similar to EKG at the beginning of his hospital stay.  No clear new T wave inversions or ST segment changes.  Troponins were obtained as well, but unfortunately added onto prior lab draw which was approximately 8 hours before the time of assessment, flat in the 50s.  His chest discomfort had  spontaneously resolved by the time we went to reassess him.  Low suspicion of ACS or of chest discomfort causing his current altered mental status.   #T2DM # hypoglycemia  A1c 6.0 earlier this month.  Continues to have labile blood sugars despite not getting any insulin.  P.o. intake is inconsistent. - D50 prn for hypoglycemia  -Changing CBG checks to every 4 hours   #Hypotension #ESRD on hemodialysis  Had dialysis yesterday with morning dose of midodrine.  His blood pressures look adequately stable during the dialysis session yesterday.  Unfortunately it  seems like he had been agitated and combative during the dialysis session, harassing staff members.  His potassium and BUN are improved today, but volume status might still be slightly overloaded.  Discussed with nephrology and we are having difficulty ascertaining his true dry weight given lack of data prior to this hospital stay and right AKA influencing his weight.  We will continue TTS dialysis per nephrology, with interim sessions needed for extra volume removal as necessary. - Renal diet  -Midodrine 10 mg daily TTS on dialysis days - Renvela 800 BID with meals    #COPD Respiratory status remains stable on RA, and patient denies any further shortness of breath.  - Continue singulair '10mg'$  QHS, albuterol nebs q6h prn   #Constipation  The patient has not yet had a bowel movement, but is passing gas. Will continue lactulose BID.  - Lactulose 10 g BID for constipation   Diet: Renal IVF: None, VTE: Heparin Code: Full PT/OT recs: CIR, patient declined TOC recs: Awaiting SNF placement Family Update:   Dispo: Anticipated discharge to Skilled nursing facility in 1 days pending clinical stability, placement.   Linus Galas, MD PGY-1 Internal Medicine Resident Please contact the on call pager after 5 pm and on weekends at [phone].

## 2022-09-21 NOTE — Progress Notes (Addendum)
[facility] Progress Note   Subjective: Completed dialysis yesterday with 2L UF. Seen in room -lethargic with one word answers on my exam. Per RN was very agitated earlier and threatening staff.    Objective Vitals:   09/20/22 1356 09/20/22 2024 09/21/22 0010 09/21/22 0354  BP: (!) 120/54 (!) 127/59  124/67  Pulse: 91 97 92 90  Resp: '16 19 18 18  '$ Temp: 98.7 F (37.1 C) 98.5 F (36.9 C)    TempSrc: Oral Oral  Oral  SpO2: 100% 97%  98%  Weight:      Height:       Physical Exam General: alert, lying in bed, nad  Heart: RRR, no mrg Lungs: CTAB, nml WOB on RA Abdomen:  soft, NTND Extremities:  R AKA trace edema  Dialysis Access: LU AVF w/chronic ulcers +b   Filed Weights   09/20/22 0358 09/20/22 0740 09/20/22 1127  Weight: 86.8 kg 87.4 kg 85 kg    Intake/Output Summary (Last 24 hours) at 09/21/2022 1020 Last data filed at 09/20/2022 1753 Gross per 24 hour  Intake 480 ml  Output 2000 ml  Net -1520 ml     Additional Objective Labs: Basic Metabolic Panel: Recent Labs  Lab 09/19/22 0747 09/20/22 0645 09/21/22 0105  NA 134* 134* 136  K 4.8 5.0 4.2  CL 94* 92* 96*  CO2 '26 26 27  '$ GLUCOSE 69* 117* 73  BUN 47* 61* 34*  CREATININE 5.83* 6.71* 4.59*  CALCIUM 8.9 8.9 8.8*  PHOS 5.0* 4.8* 3.5    Liver Function Tests: Recent Labs  Lab 09/15/22 1155 09/19/22 0747 09/20/22 0645 09/21/22 0105  AST 9*  --   --   --   ALT 8  --   --   --   ALKPHOS 231*  --   --   --   BILITOT 0.6  --   --   --   PROT 5.4*  --   --   --   ALBUMIN 2.1* 1.8* 1.8* 1.9*    CBC: Recent Labs  Lab 09/15/22 1155 09/16/22 1259 09/18/22 0140 09/18/22 0152 09/19/22 0747 09/20/22 0645 09/21/22 0105  WBC 21.7*   < > 19.6* 19.4* 17.1* 16.4* 16.2*  NEUTROABS 19.5*  --  16.6*  --  14.4*  --   --   HGB 11.2*   < > 10.2* 10.2* 9.1* 9.0* 9.1*  HCT 36.0*   < > 32.7* 32.9* 29.4* 28.8* 28.2*  MCV 88.5   < > 87.0 86.6 86.0 85.7 84.4  PLT 303   < > 359 338 353 332 390   < >  = values in this interval not displayed.    Blood Culture    Component Value Date/Time   SDES BLOOD SITE NOT SPECIFIED 09/15/2022 1510   SPECREQUEST  09/15/2022 1510    BOTTLES DRAWN AEROBIC AND ANAEROBIC Blood Culture results may not be optimal due to an excessive volume of blood received in culture bottles   CULT  09/15/2022 1510    NO GROWTH 5 DAYS Performed at [facility], [place] [address], [place], [place] [address]    REPTSTATUS 09/20/2022 FINAL 09/15/2022 1510    CBG: Recent Labs  Lab 09/19/22 2120 09/20/22 0608 09/20/22 0652 09/20/22 2031 09/21/22 0606  GLUCAP 98 49* 126* 85 100*    Medications:  magnesium sulfate bolus IVPB      (feeding supplement) PROSource Plus  30 mL Oral BID BM   aspirin EC  81 mg Oral  Daily   atorvastatin  10 mg Oral Daily   buPROPion  150 mg Oral Daily   calcitRIOL  1.75 mcg Oral Q T,Th,Sa-HD   Chlorhexidine Gluconate Cloth  6 each Topical Q0600   docusate sodium  100 mg Oral Daily   escitalopram  10 mg Oral QHS   heparin  5,000 Units Subcutaneous Q8H   HYDROmorphone  4 mg Oral TID   lactulose  10 g Oral BID   levothyroxine  125 mcg Oral Q0600   linaclotide  290 mcg Oral QPM   midodrine  10 mg Oral Once per day on Tue Thu Sat   montelukast  10 mg Oral QHS   sevelamer carbonate  2,400 mg Oral BID WC    Dialysis Orders: TTS - [facility]  3 hrs 45 min, BFR 425, DFR 800,  EDW 88.9 kg, 2 K/ 2 Ca   Access: LUE AVF  No Heparin  Mircera 50 mcg q2wks - last 10/19 Calcitriol 1.75 mcg PO qHD Venofer qweek      Assessment/Plan: Gangrenous LE Wound infection - Bilateral lower extremity gangrenous wounds. R AKA 10/24 by Dr. Donzetta Matters. No obvious stenosis to LLE- will follow with VVS.  Hyperkalemia -  Resolved with HD.  Reminded to follow low K diet.  Renal diet ordered.   Chronic ulcer on AVF - Looks a little better today.  No spontaneous bleeding. Stick away from. ESRD on HD TTS - Back on schedule. Next HD 10/31 Hypertension/volume  - BP  in goal. Home meds on hold. UF limited by hypotension on HD. Given albumin and midodrine with HD.  Now on midodrine '10mg'$  TID.  Continue titrate down volume as tolerated. Expect lower EDW s/p amputation. Post HD wt 10/28 - 85kg  Anemia of CKD - Hgb 10.2> 9.1. post surgery. Receives venofer every week - hold d/t infection. Continue to monitor. Transfuse for Hgb <7.  Next ESA due 11/2.  Secondary Hyperparathyroidism -  Ca/Phos ok.  Continue VDRA and binders -  Renvela 3AC TID.  Nutrition - Renal diet w/fluid restrictions. Add protein supp for low albumin.  DM -per PMD Agitation -new this am.  Dispo -For SNF placement  Lynnda Child PA-C [facility] 09/21/2022,10:20 AM   Seen and examined independently this morning.  Agree with note and exam as documented above by physician extender and as noted here.  Shortness of breath is better.  He was rude to staff during the last HD treatment and we discussed this.  Team was at bedside and was ordering an EKG.  He has extra fluid on.   General adult male in bed in no acute distress HEENT normocephalic atraumatic extraocular movements intact sclera anicteric Neck supple trachea midline Lungs clear to auscultation bilaterally normal work of breathing at rest  Heart S1S2 no rub Abdomen soft nontender nondistended Extremities 1+ edema left leg; right AKA Psych normal mood and affect Neuro - wakes to voice for me. very hard of hearing though Access LUE AVF bandaged. bruit and thrill  Gangrenous lower extremity wounds - s/p right AKA on 10/24 with vascular   Hyperkalemia - low K diet. On HD   Chronic ulcer on AVF - as above - stick away from the lesion; VVS is seeing him for the PAD s/p AKA     ESRD - HD per TTS schedule.  Will need to lower his EDW as he is s/p AKA.  He has extra fluid on right now   Hypotension - home meds are  off.  on midodrine pre-HD which we may be able to stop soon.  Given the extra fluid on we have kept  pre-HD for now   Anemia CKD - ESA recently dosed outpatient   Secondary hyperparathyroidism - continue binders.  on calcitriol (please do not include on discharge med list)   Disposition - for placement at SNF likely.  I have let HD SW know  Claudia Desanctis, MD 09/21/2022 3:10 PM

## 2022-09-21 NOTE — Progress Notes (Addendum)
Patient Alert to self only. Pt very agitated and combative towards staff. Refused for staff to obtain vital signs as well as refused all medications at this time. Has thrown objects in room and pulled out PIV. Requesting an order for soft wrist bilateral restraints by MD.  *MD ordered haldol '1mg'$  for agitation.  Daymon Larsen, RN

## 2022-09-22 ENCOUNTER — Inpatient Hospital Stay (HOSPITAL_COMMUNITY): Payer: Medicare Other

## 2022-09-22 DIAGNOSIS — I96 Gangrene, not elsewhere classified: Secondary | ICD-10-CM | POA: Diagnosis not present

## 2022-09-22 DIAGNOSIS — R012 Other cardiac sounds: Secondary | ICD-10-CM | POA: Diagnosis not present

## 2022-09-22 LAB — RENAL FUNCTION PANEL
Albumin: 1.9 g/dL — ABNORMAL LOW (ref 3.5–5.0)
Anion gap: 16 — ABNORMAL HIGH (ref 5–15)
BUN: 43 mg/dL — ABNORMAL HIGH (ref 8–23)
CO2: 27 mmol/L (ref 22–32)
Calcium: 9.1 mg/dL (ref 8.9–10.3)
Chloride: 94 mmol/L — ABNORMAL LOW (ref 98–111)
Creatinine, Ser: 5.64 mg/dL — ABNORMAL HIGH (ref 0.61–1.24)
GFR, Estimated: 10 mL/min — ABNORMAL LOW (ref >60)
Glucose, Bld: 79 mg/dL (ref 70–99)
Phosphorus: 4.4 mg/dL (ref 2.5–4.6)
Potassium: 4.4 mmol/L (ref 3.5–5.1)
Sodium: 137 mmol/L (ref 135–145)

## 2022-09-22 LAB — ECHOCARDIOGRAM COMPLETE
AR max vel: 2.06 cm2
AV Area VTI: 1.9 cm2
AV Area mean vel: 2.12 cm2
AV Mean grad: 6.7 mmHg
AV Peak grad: 12.3 mmHg
Ao pk vel: 1.75 m/s
Area-P 1/2: 3.97 cm2
Height: 70 in
MV M vel: 4.64 m/s
MV Peak grad: 86.1 mmHg
Radius: 0.3 cm
S' Lateral: 4.6 cm
Weight: 2998.26 oz

## 2022-09-22 LAB — CBC
HCT: 27.7 % — ABNORMAL LOW (ref 39.0–52.0)
Hemoglobin: 8.7 g/dL — ABNORMAL LOW (ref 13.0–17.0)
MCH: 26.5 pg (ref 26.0–34.0)
MCHC: 31.4 g/dL (ref 30.0–36.0)
MCV: 84.5 fL (ref 80.0–100.0)
Platelets: 436 10*3/uL — ABNORMAL HIGH (ref 150–400)
RBC: 3.28 MIL/uL — ABNORMAL LOW (ref 4.22–5.81)
RDW: 17.9 % — ABNORMAL HIGH (ref 11.5–15.5)
WBC: 15 10*3/uL — ABNORMAL HIGH (ref 4.0–10.5)
nRBC: 0 % (ref 0.0–0.2)

## 2022-09-22 LAB — GLUCOSE, CAPILLARY
Glucose-Capillary: 103 mg/dL — ABNORMAL HIGH (ref 70–99)
Glucose-Capillary: 92 mg/dL (ref 70–99)
Glucose-Capillary: 104 mg/dL — ABNORMAL HIGH (ref 70–99)
Glucose-Capillary: 143 mg/dL — ABNORMAL HIGH (ref 70–99)

## 2022-09-22 MED ORDER — PERFLUTREN LIPID MICROSPHERE
1.0000 mL | INTRAVENOUS | Status: AC | PRN
  Administered 2022-09-22: 2 mL via INTRAVENOUS

## 2022-09-22 MED ORDER — HYDROMORPHONE HCL 2 MG PO TABS
4.0000 mg | ORAL_TABLET | Freq: Two times a day (BID) | ORAL | Status: DC
  Administered 2022-09-22 – 2022-09-24 (×6): 4 mg via ORAL
  Filled 2022-09-22 (×5): qty 2

## 2022-09-22 MED ORDER — POLYVINYL ALCOHOL 1.4 % OP SOLN
1.0000 [drp] | OPHTHALMIC | Status: DC | PRN
  Filled 2022-09-22: qty 15

## 2022-09-22 MED ORDER — TRAZODONE HCL 50 MG PO TABS
25.0000 mg | ORAL_TABLET | Freq: Every day | ORAL | Status: DC
  Administered 2022-09-22 – 2022-09-28 (×7): 25 mg via ORAL
  Filled 2022-09-22 (×7): qty 1

## 2022-09-22 NOTE — Progress Notes (Addendum)
[facility] Progress Note   Subjective: Has been hoyered up to chair. Wife at bedside. He's tired, has been through a great deal over past years. Emotional support to pt. HD tomorrow on schedule.     Discussed meeting with Palliative Care  with patient and wife for goals of care. He agrees to meeting.   Objective Vitals:   09/21/22 2315 09/22/22 0325 09/22/22 0852 09/22/22 1224  BP: 129/73 122/60 134/66 (!) 106/58  Pulse:   (!) 101 95  Resp: '18 19 18 17  '$ Temp: 98 F (36.7 C) 98.5 F (36.9 C) 97.8 F (36.6 C) 98.3 F (36.8 C)  TempSrc: Oral Oral Axillary Oral  SpO2: 98% 100% 99%   Weight:      Height:       Physical Exam General: Elderly male in NAD Heart: irreg, irreg. Afib on monitor rate 90s.  Lungs: CTAB slightly decreased in bases Abdomen: NABS  Extremities: R AKA with staples L foot with foul odor.  Dialysis Access:   Additional Objective Labs: Basic Metabolic Panel: Recent Labs  Lab 09/20/22 0645 09/21/22 0105 09/22/22 0113  NA 134* 136 137  K 5.0 4.2 4.4  CL 92* 96* 94*  CO2 '26 27 27  '$ GLUCOSE 117* 73 79  BUN 61* 34* 43*  CREATININE 6.71* 4.59* 5.64*  CALCIUM 8.9 8.8* 9.1  PHOS 4.8* 3.5 4.4   Liver Function Tests: Recent Labs  Lab 09/20/22 0645 09/21/22 0105 09/22/22 0113  ALBUMIN 1.8* 1.9* 1.9*   No results for input(s): "LIPASE", "AMYLASE" in the last 168 hours. CBC: Recent Labs  Lab 09/18/22 0140 09/18/22 0152 09/19/22 0747 09/20/22 0645 09/21/22 0105 09/22/22 0113  WBC 19.6* 19.4* 17.1* 16.4* 16.2* 15.0*  NEUTROABS 16.6*  --  14.4*  --   --   --   HGB 10.2* 10.2* 9.1* 9.0* 9.1* 8.7*  HCT 32.7* 32.9* 29.4* 28.8* 28.2* 27.7*  MCV 87.0 86.6 86.0 85.7 84.4 84.5  PLT 359 338 353 332 390 436*   Blood Culture    Component Value Date/Time   SDES BLOOD SITE NOT SPECIFIED 09/15/2022 1510   SPECREQUEST  09/15/2022 1510    BOTTLES DRAWN AEROBIC AND ANAEROBIC Blood Culture results may not be optimal due to an excessive  volume of blood received in culture bottles   CULT  09/15/2022 1510    NO GROWTH 5 DAYS Performed at [facility], [place] [address] [place], [place] [address]    REPTSTATUS 09/20/2022 FINAL 09/15/2022 1510    Cardiac Enzymes: No results for input(s): "CKTOTAL", "CKMB", "CKMBINDEX", "TROPONINI" in the last 168 hours. CBG: Recent Labs  Lab 09/21/22 1114 09/21/22 1508 09/21/22 2115 09/22/22 0605 09/22/22 1221  GLUCAP 71 97 79 103* 92   Iron Studies: No results for input(s): "IRON", "TIBC", "TRANSFERRIN", "FERRITIN" in the last 72 hours. '@lablastinr3'$ @ Studies/Results: No results found. Medications:  magnesium sulfate bolus IVPB      (feeding supplement) PROSource Plus  30 mL Oral BID BM   aspirin EC  81 mg Oral Daily   atorvastatin  10 mg Oral Daily   buPROPion  150 mg Oral Daily   calcitRIOL  1.75 mcg Oral Q T,Th,Sa-HD   Chlorhexidine Gluconate Cloth  6 each Topical Q0600   docusate sodium  100 mg Oral Daily   escitalopram  10 mg Oral QHS   heparin  5,000 Units Subcutaneous Q8H   HYDROmorphone  4 mg Oral BID   lactulose  10 g Oral BID   levothyroxine  125 mcg Oral Q0600   linaclotide  290 mcg Oral QPM   midodrine  10 mg Oral Once per day on Tue Thu Sat   montelukast  10 mg Oral QHS   sevelamer carbonate  2,400 mg Oral BID WC     Dialysis Orders: TTS - [facility]  3 hrs 45 min, BFR 425, DFR 800,  EDW 88.9 kg, 2 K/ 2 Ca   Access: LUE AVF  No Heparin  Mircera 50 mcg q2wks - last 10/19 Calcitriol 1.75 mcg PO qHD Venofer qweek      Assessment/Plan: Gangrenous LE Wound infection - Bilateral lower extremity gangrenous wounds. R AKA 10/24 by Dr. Donzetta Matters. No obvious stenosis to LLE- will ultimately need L amputation. VVS following.   Hyperkalemia -  Resolved with HD.  Reminded to follow low K diet.  Renal diet ordered.   Chronic ulcer on AVF - Looks a little better today.  No spontaneous bleeding. Stick away from. ESRD on HD TTS - Back on schedule. Next HD  10/31 Hypertension/volume  - BP in goal. Home meds on hold. UF limited by hypotension on HD. Given albumin and midodrine with HD.  Now on midodrine '10mg'$  TID.  Continue titrate down volume as tolerated. Expect lower EDW s/p amputation. Post HD wt 10/28 - 85kg  Anemia of CKD - Hgb 10.2> 9.1. post surgery. Receives venofer every week - hold d/t infection. Continue to monitor. Transfuse for Hgb <7.  Next ESA due 11/2.  Secondary Hyperparathyroidism -  Ca/Phos ok.  Continue VDRA and binders -  Renvela 3AC TID.  Nutrition - Renal diet w/fluid restrictions. Add protein supp for low albumin.  DM -per PMD Agitation -new this am. Question post anesthesia complication. Now at baseline. Resolved.  Dispo -For SNF placement GOC-Pt elderly with multiple co-morbid conditions. Remains full code. Consult Palliative Care for goals of care.   Teresea Donley H. Susen Haskew NP-C 09/22/2022, 2:01 PM  [employer] [phone]

## 2022-09-22 NOTE — Progress Notes (Signed)
Physical Therapy Treatment Patient Details Name: Christian Dalton MRN: [id number] DOB: 20-Apr-1941 Today's Date: 09/22/2022   History of Present Illness Pt is a 81 y/o male presenting on 09/15/22 with worsening bilateral foot gangrene, s/p R AKA 10/24. PMH includes: ESRD on TTS HD, DM2, HTN, CAD, depression, COPD, PAD.    PT Comments    Pt agreeable to participate with encouragement. Session focused on bed mobility, sitting balance and transfer training. Pt requiring two person maximal assist for slide board transfer from bed to chair. Will continue to follow acutely to progress as tolerated.    Recommendations for follow up therapy are one component of a multi-disciplinary discharge planning process, led by the attending physician.  Recommendations may be updated based on patient status, additional functional criteria and insurance authorization.  Follow Up Recommendations  Skilled nursing-short term rehab (<3 hours/day) Can patient physically be transported by private vehicle: No   Assistance Recommended at Discharge Frequent or constant Supervision/Assistance  Patient can return home with the following Two people to help with walking and/or transfers;A lot of help with bathing/dressing/bathroom;Assistance with cooking/housework;Assist for transportation;Help with stairs or ramp for entrance   Equipment Recommendations  Other (comment) (defer)    Recommendations for Other Services       Precautions / Restrictions Precautions Precautions: Fall Restrictions Weight Bearing Restrictions: Yes RLE Weight Bearing: Non weight bearing LLE Weight Bearing: Weight bearing as tolerated     Mobility  Bed Mobility Overal bed mobility: Needs Assistance Bed Mobility: Supine to Sit, Rolling Rolling: Min assist   Supine to sit: Mod assist     General bed mobility comments: Pt seeking external support to pull up to sitting position. Use of bed pad to scoot left hip out to edge of bed     Transfers Overall transfer level: Needs assistance Equipment used: Sliding board Transfers: Bed to chair/wheelchair/BSC            Lateral/Scoot Transfers: Max assist, +2 physical assistance General transfer comment: MaxA + 2 for lateral scoot transfer with slide board to left from bed to chair. Requiring multiple scoots    Ambulation/Gait                   Stairs             Wheelchair Mobility    Modified Rankin (Stroke Patients Only)       Balance Overall balance assessment: Needs assistance Sitting-balance support: Feet supported Sitting balance-Leahy Scale: Fair Sitting balance - Comments: supervision-min guard                                    Cognition Arousal/Alertness: Awake/alert Behavior During Therapy: Flat affect Overall Cognitive Status: Impaired/Different from baseline Area of Impairment: Following commands, Awareness, Problem solving                       Following Commands: Follows one step commands with increased time   Awareness: Emergent Problem Solving: Slow processing, Decreased initiation, Difficulty sequencing, Requires verbal cues          Exercises      General Comments        Pertinent Vitals/Pain Pain Assessment Pain Assessment: Faces Faces Pain Scale: Hurts a little bit Pain Location: R residual limb, generalized Pain Descriptors / Indicators: Grimacing Pain Intervention(s): Monitored during session    Home Living  Prior Function            PT Goals (current goals can now be found in the care plan section) Acute Rehab PT Goals Patient Stated Goal: to go to rehab Potential to Achieve Goals: Fair Progress towards PT goals: Progressing toward goals    Frequency    Min 2X/week      PT Plan Current plan remains appropriate    Co-evaluation              AM-PAC PT "6 Clicks" Mobility   Outcome Measure  Help needed turning  from your back to your side while in a flat bed without using bedrails?: A Little Help needed moving from lying on your back to sitting on the side of a flat bed without using bedrails?: A Lot Help needed moving to and from a bed to a chair (including a wheelchair)?: Total Help needed standing up from a chair using your arms (e.g., wheelchair or bedside chair)?: Total Help needed to walk in hospital room?: Total Help needed climbing 3-5 steps with a railing? : Total 6 Click Score: 9    End of Session Equipment Utilized During Treatment: Gait belt Activity Tolerance: Patient tolerated treatment well Patient left: in chair;with call bell/phone within reach;with chair alarm set;with family/visitor present Nurse Communication: Mobility status PT Visit Diagnosis: Muscle weakness (generalized) (M62.81);Pain     Time: 5830-9407 PT Time Calculation (min) (ACUTE ONLY): 19 min  Charges:  $Therapeutic Activity: 8-22 mins                     Wyona Almas, PT, DPT Acute Rehabilitation Services Office [phone]    Deno Etienne 09/22/2022, 2:09 PM

## 2022-09-22 NOTE — Progress Notes (Addendum)
Subjective:  The patient reports he feels fine today, with some RLE pain. Denies any further chest pain. He has not had a bowel movement, but is passing gas. He reports he has been able to eat without difficulty. No reported overnight events.   Objective:  Vital signs in last 24 hours: Vitals:   09/21/22 1940 09/21/22 2315 09/22/22 0325 09/22/22 0852  BP: 123/60 129/73 122/60 134/66  Pulse: 93   (!) 101  Resp: '20 18 19 18  '$ Temp: 97.9 F (36.6 C) 98 F (36.7 C) 98.5 F (36.9 C) 97.8 F (36.6 C)  TempSrc: Oral Oral Oral Axillary  SpO2: 96% 98% 100% 99%  Weight:      Height:       Weight change:   Intake/Output Summary (Last 24 hours) at 09/22/2022 1015 Last data filed at 09/21/2022 1851 Gross per 24 hour  Intake 480 ml  Output 0 ml  Net 480 ml   General: Well-appearing; No acute distress Cardiovascular: Regular rate and rhythm; No murmurs, rubs, or gallops; 2+ radial pulses; 1+ left dp pulse. Pulmonary: Normal work of breathing on room air; Lungs clear to auscultation bilaterally; No wheezes, rales, or rhonchi.  Extremities:  - RLE:  R AKA intact with staples; clean and dry; no swelling, erythema, bleeding, or drainage   - LLE: Warm; 1+ dp pulse; Stable dry gangrene to toes 1 and 2 with no redness, erythema, and drainage   Psych: Alert and oriented x3; Able to follow commands and answer questions appropriately; Appropriate mood and affect; No focal deficits   Assessment/Plan:  Principal Problem:   Gangrene of both feet (HCC) Active Problems:   ESRD (end stage renal disease) on dialysis (HCC)   Type II diabetes mellitus with renal manifestations (HCC)   HTN (hypertension)   HLD (hyperlipidemia)   COPD (chronic obstructive pulmonary disease) (HCC)   HFrEF (heart failure with reduced ejection fraction) (HCC)  Christian Dalton is a 81 y.o. male with past medical history of ESRD on T/Th/S HD, T2DM, HTN, CAD, HLD, hypothyroidism, depression, COPD, HFrEF, and peripheral  artery disease presenting with worsening bilateral foot ischemic necrosis, status post right AKA performed Tuesday, 10/24.     #Bilateral foot ischemic necrosis  RLE healing well. LLE is warm and dry with 1+ dp pulse and no signs of infection. His WBC continues to decrease and he remains afebrile. Vascular surgery saw him this morning and he remains stable for discharge with 4-5 week follow-up for staple removal and LLE planning. The patient continues to endorse some RLE pain, however, his recent delirium suggests TID Dilaudid should be decreased to BID to avoid overmedicating.   - Dilaudid 4 mg BID for pain control - Aspirin '81mg'$  and atorvastatin '80mg'$  daily  - Dispo to SNF for rehabilitation - Heparin TID for VTE prophylaxis  #Hospital associated delirium - resolved Patient was appropriately engaged in conversation today and appears alert and oriented x 3. He is cooperative, and his mood and affect also appear to be back to baseline. Vitals are stable, and his CBC and CMP today are WNL. Dilaudid was decreased to BID to avoid worsening his delirium. Will also avoid Haldol for agitation due to safety concerns; will consider Tramadol or Seroquil prn for any further agitation. - Delirium precautions - Trazodone '25mg'$  nightly   #Frequent PVCs The patient denies chest pain today and had a normal cardiovascular exam. He did have some increased PVC burden on telemetry. He had a negative ACS workup yesterday including EKG  and troponin, and his chest pain resolved spontaneously. He has not had a recent echo, so will order one today to evaluate for structural heart disease.  - Echocardiogram pending    #T2DM # hypoglycemia  A1c 6.0 earlier this month. His fasting blood glucose today is 103. He has not been receiving insulin, and has not required any further D50 in the past 24 hours. Will continue to monitor and encourage PO intake.  - D50 prn for hypoglycemia  - CBG checks every 4 hours     #Hypotension #ESRD on hemodialysis  The patient is normotensive at 122/60. Per nephrology, patient is back on T/Th/Sat schedule and was last dialyzed Saturday, 10/28. Electrolytes today are WNL Will continue to monitor BMP.  - Renal diet  - Midodrine 10 TID with meals  - Renvela 800 BID with meals      #COPD Respiratory status remains stable on RA, and patient denies any further shortness of breath.  - Continue singulair '10mg'$  QHS, albuterol nebs q6h prn  #Constipation  The patient has not yet had a bowel movement, but is passing gas. Will continue lactulose BID.  - Lactulose 10 g BID for constipation         LOS: 7 days   Paulo Fruit, Medical Student 09/22/2022, 10:15 AM

## 2022-09-22 NOTE — Progress Notes (Addendum)
  Progress Note    09/22/2022 7:04 AM 6 Days Post-Op  Subjective:  sleeping but wakes easily.  No complaints  Afebrile   Vitals:   09/21/22 2315 09/22/22 0325  BP: 129/73 122/60  Pulse:    Resp: 18 19  Temp: 98 F (36.7 C) 98.5 F (36.9 C)  SpO2: 98% 100%    Physical Exam: General:  no distress Lungs:  non labored Incisions:  right AKA incision looks good Extremities:  left foot is malodorous with gangrenous toes  CBC    Component Value Date/Time   WBC 15.0 (H) 09/22/2022 0113   RBC 3.28 (L) 09/22/2022 0113   HGB 8.7 (L) 09/22/2022 0113   HCT 27.7 (L) 09/22/2022 0113   PLT 436 (H) 09/22/2022 0113   MCV 84.5 09/22/2022 0113   MCH 26.5 09/22/2022 0113   MCHC 31.4 09/22/2022 0113   RDW 17.9 (H) 09/22/2022 0113   LYMPHSABS 1.0 09/19/2022 0747   MONOABS 1.2 (H) 09/19/2022 0747   EOSABS 0.4 09/19/2022 0747   BASOSABS 0.1 09/19/2022 0747    BMET    Component Value Date/Time   NA 137 09/22/2022 0113   K 4.4 09/22/2022 0113   CL 94 (L) 09/22/2022 0113   CO2 27 09/22/2022 0113   GLUCOSE 79 09/22/2022 0113   BUN 43 (H) 09/22/2022 0113   CREATININE 5.64 (H) 09/22/2022 0113   CALCIUM 9.1 09/22/2022 0113   GFRNONAA 10 (L) 09/22/2022 0113   GFRAA 7 (L) 10/26/2019 1626    INR    Component Value Date/Time   INR 1.2 09/09/2022 1550     Intake/Output Summary (Last 24 hours) at 09/22/2022 0704 Last data filed at 09/21/2022 1851 Gross per 24 hour  Intake 720 ml  Output 0 ml  Net 720 ml     Assessment/Plan:  81 y.o. male is s/p:  Right AKA  6 Days Post-Op   -right AKA is healing nicely-he will f/u in our office in 4-5 weeks for staple removal-will schedule appt once decision made about left leg.  -left foot is malodorous with gangrenous toes.  Allowing to demarcate to determine level of amputation    Leontine Locket, PA-C [facility] [phone] 09/22/2022 7:04 AM  I have independently interviewed and examined patient and  agree with PA assessment and plan above.  Holding on left lower extremity amputation at this time does not appear infected.  Okay for discharge from vascular standpoint with follow-up as noted above.  Zarian Colpitts C. Donzetta Matters, MD [facility] [place] Office: [phone] Pager: [phone]

## 2022-09-22 NOTE — Progress Notes (Signed)
Mobility Specialist Progress Note:   09/22/22 1508  Mobility  Activity Transferred from bed to chair  Level of Assistance Total care  Assistive Device MaxiMove  Activity Response Tolerated well  $Mobility charge 1 Mobility   Pt in chair needing to get back to bed for a test. Complaints of pain when getting sling around incision. Left in bed with call bell in reach and all needs met.   [facility] Surveyor, mining Chat only

## 2022-09-22 NOTE — Progress Notes (Signed)
  Echocardiogram 2D Echocardiogram has been performed.  Christian Dalton 09/22/2022, 3:57 PM

## 2022-09-22 NOTE — TOC Progression Note (Addendum)
Transition of Care [facility]) - Progression Note    Patient Details  Name: Christian Dalton MRN: [id number] Date of Birth: Jul 26, 1941  Transition of Care [facility]) CM/SW [place], [place] Phone Number: 09/22/2022, 10:11 AM  Clinical Narrative:     Sent message to [facility] SNF liaison- waiting on response.    Expected Discharge Plan: [place] Barriers to Discharge: SNF Pending bed offer, Continued Medical Work up  Expected Discharge Plan and Services Expected Discharge Plan: [place] In-house Referral: Clinical Social Work     Living arrangements for the past 2 months: Single Family Home                                       Social Determinants of Health (SDOH) Interventions    Readmission Risk Interventions     No data to display

## 2022-09-23 ENCOUNTER — Telehealth: Payer: Self-pay | Admitting: Physician Assistant

## 2022-09-23 DIAGNOSIS — I96 Gangrene, not elsewhere classified: Secondary | ICD-10-CM | POA: Diagnosis not present

## 2022-09-23 LAB — GLUCOSE, CAPILLARY
Glucose-Capillary: 79 mg/dL (ref 70–99)
Glucose-Capillary: 109 mg/dL — ABNORMAL HIGH (ref 70–99)
Glucose-Capillary: 124 mg/dL — ABNORMAL HIGH (ref 70–99)

## 2022-09-23 LAB — CBC
HCT: 26.3 % — ABNORMAL LOW (ref 39.0–52.0)
Hemoglobin: 8.5 g/dL — ABNORMAL LOW (ref 13.0–17.0)
MCH: 27.3 pg (ref 26.0–34.0)
MCHC: 32.3 g/dL (ref 30.0–36.0)
MCV: 84.6 fL (ref 80.0–100.0)
Platelets: 437 10*3/uL — ABNORMAL HIGH (ref 150–400)
RBC: 3.11 MIL/uL — ABNORMAL LOW (ref 4.22–5.81)
RDW: 18.2 % — ABNORMAL HIGH (ref 11.5–15.5)
WBC: 13.2 10*3/uL — ABNORMAL HIGH (ref 4.0–10.5)
nRBC: 0 % (ref 0.0–0.2)

## 2022-09-23 LAB — RENAL FUNCTION PANEL
Albumin: 1.9 g/dL — ABNORMAL LOW (ref 3.5–5.0)
Anion gap: 13 (ref 5–15)
BUN: 54 mg/dL — ABNORMAL HIGH (ref 8–23)
CO2: 25 mmol/L (ref 22–32)
Calcium: 8.9 mg/dL (ref 8.9–10.3)
Chloride: 96 mmol/L — ABNORMAL LOW (ref 98–111)
Creatinine, Ser: 6.34 mg/dL — ABNORMAL HIGH (ref 0.61–1.24)
GFR, Estimated: 8 mL/min — ABNORMAL LOW (ref >60)
Glucose, Bld: 102 mg/dL — ABNORMAL HIGH (ref 70–99)
Phosphorus: 4.4 mg/dL (ref 2.5–4.6)
Potassium: 4.6 mmol/L (ref 3.5–5.1)
Sodium: 134 mmol/L — ABNORMAL LOW (ref 135–145)

## 2022-09-23 MED ORDER — ACETAMINOPHEN 325 MG PO TABS
650.0000 mg | ORAL_TABLET | Freq: Four times a day (QID) | ORAL | Status: DC | PRN
  Administered 2022-09-23 – 2022-09-29 (×4): 650 mg via ORAL
  Filled 2022-09-23 (×4): qty 2

## 2022-09-23 NOTE — Progress Notes (Signed)
Christian Dalton Progress Note   Subjective: Seen on HD C/O pain, requesting pain med but SBP 102. Lying flat. Denies SOB.  Objective Vitals:   09/22/22 2301 09/23/22 0406 09/23/22 0835 09/23/22 0849  BP: (!) 111/58 127/65 (!) 97/45 (!) 100/48  Pulse: 94 99 93 89  Resp: '20 20 18 17  '$ Temp: 98.2 F (36.8 C) 98 F (36.7 C) 98.1 F (36.7 C)   TempSrc: Oral Oral Oral   SpO2: 98% 99% 95% 98%  Weight:      Height:       Physical Exam General: Elderly male in NAD Heart: irreg, irreg. Afib on monitor rate 90s.  Lungs: CTAB slightly decreased in bases Abdomen: NABS  Extremities: R AKA with staples L foot with foul odor.  Dialysis Access: L AVF cannulated   Additional Objective Labs: Basic Metabolic Panel: Recent Labs  Lab 09/21/22 0105 09/22/22 0113 09/23/22 0130  NA 136 137 134*  K 4.2 4.4 4.6  CL 96* 94* 96*  CO2 '27 27 25  '$ GLUCOSE 73 79 102*  BUN 34* 43* 54*  CREATININE 4.59* 5.64* 6.34*  CALCIUM 8.8* 9.1 8.9  PHOS 3.5 4.4 4.4   Liver Function Tests: Recent Labs  Lab 09/21/22 0105 09/22/22 0113 09/23/22 0130  ALBUMIN 1.9* 1.9* 1.9*   No results for input(s): "LIPASE", "AMYLASE" in the last 168 hours. CBC: Recent Labs  Lab 09/18/22 0140 09/18/22 0152 09/19/22 0747 09/20/22 0645 09/21/22 0105 09/22/22 0113 09/23/22 0130  WBC 19.6*   < > 17.1* 16.4* 16.2* 15.0* 13.2*  NEUTROABS 16.6*  --  14.4*  --   --   --   --   HGB 10.2*   < > 9.1* 9.0* 9.1* 8.7* 8.5*  HCT 32.7*   < > 29.4* 28.8* 28.2* 27.7* 26.3*  MCV 87.0   < > 86.0 85.7 84.4 84.5 84.6  PLT 359   < > 353 332 390 436* 437*   < > = values in this interval not displayed.   Blood Culture    Component Value Date/Time   SDES BLOOD SITE NOT SPECIFIED 09/15/2022 1510   SPECREQUEST  09/15/2022 1510    BOTTLES DRAWN AEROBIC AND ANAEROBIC Blood Culture results may not be optimal due to an excessive volume of blood received in culture bottles   CULT  09/15/2022 1510    NO GROWTH 5  DAYS Performed at [facility], [place] [address] [place], [place] [address]    REPTSTATUS 09/20/2022 FINAL 09/15/2022 1510    Cardiac Enzymes: No results for input(s): "CKTOTAL", "CKMB", "CKMBINDEX", "TROPONINI" in the last 168 hours. CBG: Recent Labs  Lab 09/22/22 0605 09/22/22 1221 09/22/22 1625 09/22/22 2120 09/23/22 0608  GLUCAP 103* 92 104* 143* 79   Iron Studies: No results for input(s): "IRON", "TIBC", "TRANSFERRIN", "FERRITIN" in the last 72 hours. '@lablastinr3'$ @ Studies/Results: ECHOCARDIOGRAM COMPLETE  Result Date: 09/22/2022    ECHOCARDIOGRAM REPORT   Patient Name:   Christian Dalton Date of Exam: 09/22/2022 Medical Rec #:  [id number]    Height:       70.0 in Accession #:    [id number]   Weight:       187.4 lb Date of Birth:  [birth year]    BSA:          2.030 m Patient Age:    81 years     BP:           106/58 mmHg Patient Gender: M  HR:           87 bpm. Exam Location:  Inpatient Procedure: 2D Echo, Cardiac Doppler, Color Doppler and Intracardiac            Opacification Agent Indications:    Abnormal heart rhythm  History:        Patient has prior history of Echocardiogram examinations, most                 recent 05/14/2022. HFrEF, COPD; Risk Factors:Diabetes,                 Hypertension and Dyslipidemia. ESRD.  Sonographer:    Eartha Inch Referring Phys: [id number] [facility]  Sonographer Comments: Image acquisition challenging due to patient body habitus, Image acquisition challenging due to respiratory motion and Image acquisition challenging due to COPD. IMPRESSIONS  1. Left ventricular ejection fraction, by estimation, is 20 to 25%. The left ventricle has severely decreased function. The left ventricle demonstrates global hypokinesis. The left ventricular internal cavity size was mildly dilated. Left ventricular diastolic parameters are indeterminate.  2. Right ventricular systolic function is low normal. The right ventricular size is normal. Tricuspid  regurgitation signal is inadequate for assessing PA pressure.  3. Left atrial size was mildly dilated.  4. The mitral valve is degenerative. Mild to moderate mitral valve regurgitation. No evidence of mitral stenosis.  5. The aortic valve is grossly normal. There is mild calcification of the aortic valve. Aortic valve regurgitation is mild. Aortic valve sclerosis/calcification is present, without any evidence of aortic stenosis.  6. The inferior vena cava is normal in size with greater than 50% respiratory variability, suggesting right atrial pressure of 3 mmHg. Conclusion(s)/Recommendation(s): No left ventricular mural or apical thrombus/thrombi. FINDINGS  Left Ventricle: Left ventricular ejection fraction, by estimation, is 20 to 25%. The left ventricle has severely decreased function. The left ventricle demonstrates global hypokinesis. The left ventricular internal cavity size was mildly dilated. There is no left ventricular hypertrophy. Left ventricular diastolic parameters are indeterminate. Right Ventricle: The right ventricular size is normal. No increase in right ventricular wall thickness. Right ventricular systolic function is low normal. Tricuspid regurgitation signal is inadequate for assessing PA pressure. Left Atrium: Left atrial size was mildly dilated. Right Atrium: Right atrial size was normal in size. Pericardium: There is no evidence of pericardial effusion. Mitral Valve: The mitral valve is degenerative in appearance. Mild mitral annular calcification. Mild to moderate mitral valve regurgitation. No evidence of mitral valve stenosis. Tricuspid Valve: The tricuspid valve is normal in structure. Tricuspid valve regurgitation is trivial. No evidence of tricuspid stenosis. Aortic Valve: The aortic valve is grossly normal. There is mild calcification of the aortic valve. Aortic valve regurgitation is mild. Aortic valve sclerosis/calcification is present, without any evidence of aortic stenosis. Aortic  valve mean gradient measures 6.7 mmHg. Aortic valve peak gradient measures 12.3 mmHg. Aortic valve area, by VTI measures 1.90 cm. Pulmonic Valve: The pulmonic valve was normal in structure. Pulmonic valve regurgitation is trivial. No evidence of pulmonic stenosis. Aorta: The aortic root is normal in size and structure. Ascending aorta measurements are within normal limits for age when indexed to body surface area. Venous: The inferior vena cava is normal in size with greater than 50% respiratory variability, suggesting right atrial pressure of 3 mmHg. IAS/Shunts: No atrial level shunt detected by color flow Doppler.  LEFT VENTRICLE PLAX 2D LVIDd:         6.00 cm   Diastology LVIDs:  4.60 cm   LV e' medial:    2.68 cm/s LV PW:         0.90 cm   LV E/e' medial:  41.0 LV IVS:        0.85 cm   LV e' lateral:   10.00 cm/s LVOT diam:     2.00 cm   LV E/e' lateral: 11.0 LV SV:         65 LV SV Index:   32 LVOT Area:     3.14 cm  RIGHT VENTRICLE             IVC RV S prime:     10.40 cm/s  IVC diam: 1.50 cm TAPSE (M-mode): 1.9 cm LEFT ATRIUM             Index        RIGHT ATRIUM           Index LA diam:        4.40 cm 2.17 cm/m   RA Area:     15.30 cm LA Vol (A2C):   99.3 ml 48.91 ml/m  RA Volume:   38.80 ml  19.11 ml/m LA Vol (A4C):   50.9 ml 25.07 ml/m LA Biplane Vol: 72.7 ml 35.81 ml/m  AORTIC VALVE AV Area (Vmax):    2.06 cm AV Area (Vmean):   2.12 cm AV Area (VTI):     1.90 cm AV Vmax:           175.39 cm/s AV Vmean:          120.476 cm/s AV VTI:            0.340 m AV Peak Grad:      12.3 mmHg AV Mean Grad:      6.7 mmHg LVOT Vmax:         115.13 cm/s LVOT Vmean:        81.236 cm/s LVOT VTI:          0.206 m LVOT/AV VTI ratio: 0.60  AORTA Ao Root diam: 2.90 cm Ao Asc diam:  3.70 cm MITRAL VALVE MV Area (PHT): 3.97 cm       SHUNTS MV Decel Time: 191 msec       Systemic VTI:  0.21 m MR Peak grad:    86.1 mmHg    Systemic Diam: 2.00 cm MR Mean grad:    52.0 mmHg MR Vmax:         464.00 cm/s MR Vmean:         342.0 cm/s MR PISA:         0.57 cm MR PISA Eff ROA: 4 mm MR PISA Radius:  0.30 cm MV E velocity: 110.00 cm/s MV A velocity: 107.00 cm/s MV E/A ratio:  1.03 Cherlynn Kaiser MD Electronically signed by Cherlynn Kaiser MD Signature Date/Time: 09/22/2022/5:25:52 PM    Final    Medications:  magnesium sulfate bolus IVPB      (feeding supplement) PROSource Plus  30 mL Oral BID BM   aspirin EC  81 mg Oral Daily   atorvastatin  10 mg Oral Daily   buPROPion  150 mg Oral Daily   calcitRIOL  1.75 mcg Oral Q T,Th,Sa-HD   Chlorhexidine Gluconate Cloth  6 each Topical Q0600   docusate sodium  100 mg Oral Daily   escitalopram  10 mg Oral QHS   heparin  5,000 Units Subcutaneous Q8H   HYDROmorphone  4 mg Oral BID   lactulose  10 g Oral  BID   levothyroxine  125 mcg Oral Q0600   linaclotide  290 mcg Oral QPM   midodrine  10 mg Oral Once per day on Tue Thu Sat   montelukast  10 mg Oral QHS   sevelamer carbonate  2,400 mg Oral BID WC   traZODone  25 mg Oral QHS     Dialysis Orders: TTS - [facility]  3 hrs 45 min, BFR 425, DFR 800,  EDW 88.9 kg, 2 K/ 2 Ca   Access: LUE AVF  No Heparin  Mircera 50 mcg q2wks - last 10/19 Calcitriol 1.75 mcg PO qHD Venofer q week      Assessment/Plan: Gangrenous LE Wound infection - Bilateral lower extremity gangrenous wounds. R AKA 10/24 by Dr. Donzetta Matters. No obvious stenosis to LLE- will ultimately need L amputation. VVS following.   Hyperkalemia -  Resolved with HD.  Reminded to follow low K diet.  Renal diet ordered.   Chronic ulcer on AVF - Looks a little better today.  No spontaneous bleeding. Stick away from. ESRD on HD TTS - Back on schedule. Next HD 10/31 Hypertension/volume  - BP in goal. Home meds on hold. UF limited by hypotension on HD. Given albumin and midodrine with HD.  Now on midodrine '10mg'$  TID.  Continue titrate down volume as tolerated. Expect lower EDW s/p amputation. Post HD wt 10/28 - 85kg  Anemia of CKD - Hgb 10.2> 9.1. post surgery. Receives  venofer every week - hold d/t infection. Continue to monitor. Transfuse for Hgb <7.  Next ESA due 11/2.  Secondary Hyperparathyroidism -  Ca/Phos ok.  Continue VDRA and binders -  Renvela 3AC TID.  Nutrition - Renal diet w/fluid restrictions. Add protein supp for low albumin.  DM -per PMD Agitation -new this am. Question post anesthesia complication. Now at baseline. Resolved.  Dispo -For SNF placement GOC-Pt elderly with multiple co-morbid conditions. Remains full code. Consult Palliative Care for goals of care.     Nelia Rogoff H. Joshiah Traynham NP-C 09/23/2022, 9:05 AM  [employer] [phone]

## 2022-09-23 NOTE — Progress Notes (Signed)
Subjective:  The patient reports some RLE pain, slightly improved from yesterday. Denies chest pain and shortness of breath. He is aware of his R AKA surgery and can recall that he has been admitted for about a week.   Per nursing, the patient was a little confused last night, but not aggressive or inappropriate.   Objective:  Vital signs in last 24 hours: Vitals:   09/22/22 1625 09/22/22 1959 09/22/22 2301 09/23/22 0406  BP: 118/61 (!) 125/105 (!) 111/58 127/65  Pulse: 91 100 94 99  Resp: '16 20 20 20  '$ Temp: 98.3 F (36.8 C) 97.9 F (36.6 C) 98.2 F (36.8 C) 98 F (36.7 C)  TempSrc: Oral Oral Oral Oral  SpO2: 96% 100% 98% 99%  Weight:      Height:       Weight change:   Intake/Output Summary (Last 24 hours) at 09/23/2022 0732 Last data filed at 09/22/2022 2200 Gross per 24 hour  Intake 240 ml  Output 2 ml  Net 238 ml   General: Sitting comfortably in bed eating breakfast; No acute distress Cardiovascular: RRR; No murmurs, rubs, or gallops; warm extremities; 1+ left dp pulse Pulmonary: No respiratory distress; Lungs clear to auscultation bilaterally  Extremities:  - RLE:  R AKA intact with staples; clean and dry; no swelling, erythema, bleeding, or drainage   - LLE:  Warm; 1+ dp pulse; Stable dry gangrene to toes 1 and 2 with no redness, erythema, and drainage  Psych/Neuro: Alert and oriented x3; Cooperative, able to follow commands and answer questions appropriately; Appropriate mood and affect; No focal deficits  Assessment/Plan:  Principal Problem:   Gangrene of both feet (HCC) Active Problems:   ESRD (end stage renal disease) on dialysis (HCC)   Type II diabetes mellitus with renal manifestations (HCC)   HTN (hypertension)   HLD (hyperlipidemia)   COPD (chronic obstructive pulmonary disease) (HCC)   HFrEF (heart failure with reduced ejection fraction) (HCC)  Christian Dalton is a 81 y.o. male with past medical history of ESRD on T/Th/S HD, T2DM, HTN, CAD, HLD,  hypothyroidism, depression, COPD, HFrEF, and peripheral artery disease presenting with worsening bilateral foot ischemic necrosis, status post right AKA performed Tuesday, 10/24.    #Bilateral foot ischemic necrosis  RLE still healing well with no signs of infection. The patient reports some pain, but improved since yesterday. LLE is warm, dry, with 1+ dp pulses. He is medically stable for discharge pending SNF placement. Social work has contacted [employer], awaiting response.    - Dilaudid 4 mg BID for pain control - Aspirin '81mg'$  and atorvastatin '80mg'$  daily  - Dispo to SNF for rehabilitation - Heparin TID for VTE prophylaxis   #Hospital associated delirium - resolved The patient is sitting up in bed eating breakfast, cooperative, and answering questions appropriately. He is A&O x3 and is able to sustain attention and engage in conversation. He is able to easily recall that he had a right AKA procedure and he has been in the hospital for about a week. He appears much improved from Sunday, but given delirium waxes and wanes, will continue to monitor.  - Delirium precautions - Trazodone '25mg'$  nightly   #Frequent PVCs The patient denies any further chest pain and had a normal cardiovascular exam today. His echo results were unchanged from his last in 04/2022; He has an EF of 20-25% with severely decreased LV function, mild to moderate mitral valve regurgitation, and mild aortic regurgitation. He has had a negative chest  pain workup thus far, making ACS unlikely and his CHF is stable, so will continue to monitor.    #T2DM # hypoglycemia  A1c 6.0 earlier this month. His fasting blood glucose today is 79. He has not been receiving insulin, and has not required any further D50 in the past 24 hours. On exam today he had finished nearly all his breakfast, so PO intake appears to be improving. Will continue to monitor and encourage PO intake.  - D50 prn for hypoglycemia  - CBG checks every 4  hours      #Hypotension #ESRD on hemodialysis  The patient is normotensive at 127/65. Electrolytes today are WNL. Patient is back on T/Th/Sat schedule and is being dialyzed today.  Will continue to monitor BMP.  - Renal diet  - Midodrine 10 TID with meals  - Renvela 800 BID with meals      #COPD Respiratory status remains stable on RA, and patient denies any further shortness of breath.  - Continue singulair '10mg'$  QHS, albuterol nebs q6h prn   #Constipation  The patient has not yet had a bowel movement, but is passing gas. Will continue lactulose BID.  - Lactulose 10 g BID for constipation     LOS: 8 days   Paulo Fruit, Medical Student 09/23/2022, 7:32 AM

## 2022-09-23 NOTE — TOC Progression Note (Signed)
Transition of Care [facility]) - Progression Note    Patient Details  Name: Christian Dalton MRN: [id number] Date of Birth: [birth year]  Transition of Care [facility]) CM/SW [place],  Phone Number: 09/23/2022, 4:15 PM  Clinical Narrative:     [facility] is reconsidering - they are following up with wound care nurse to determine if they can care for the patient's wound (left foot).  TOC will continue to follow and assist with discharge planning.  Thurmond Butts, MSW, LCSW Clinical Social Worker    Expected Discharge Plan: Skilled Nursing Facility Barriers to Discharge: SNF Pending bed offer, Continued Medical Work up  Expected Discharge Plan and Services Expected Discharge Plan: [place] In-house Referral: Clinical Social Work     Living arrangements for the past 2 months: Single Family Home                                       Social Determinants of Health (SDOH) Interventions    Readmission Risk Interventions     No data to display

## 2022-09-23 NOTE — Plan of Care (Signed)
  Problem: Education: Goal: Knowledge of the prescribed therapeutic regimen will improve Outcome: Progressing Goal: Ability to verbalize activity precautions or restrictions will improve Outcome: Progressing Goal: Understanding of discharge needs will improve Outcome: Progressing   Problem: Activity: Goal: Ability to perform//tolerate increased activity and mobilize with assistive devices will improve Outcome: Progressing   Problem: Clinical Measurements: Goal: Postoperative complications will be avoided or minimized Outcome: Progressing   Problem: Self-Care: Goal: Ability to meet self-care needs will improve Outcome: Progressing   Problem: Self-Concept: Goal: Ability to maintain and perform role responsibilities to the fullest extent possible will improve Outcome: Progressing   Problem: Pain Management: Goal: Pain level will decrease with appropriate interventions Outcome: Progressing   Problem: Skin Integrity: Goal: Demonstration of wound healing without infection will improve Outcome: Progressing   Problem: Education: Goal: Knowledge of General Education information will improve Description: Including pain rating scale, medication(s)/side effects and non-pharmacologic comfort measures Outcome: Progressing   Problem: Health Behavior/Discharge Planning: Goal: Ability to manage health-related needs will improve Outcome: Progressing   Problem: Clinical Measurements: Goal: Ability to maintain clinical measurements within normal limits will improve Outcome: Progressing Goal: Will remain free from infection Outcome: Progressing Goal: Diagnostic test results will improve Outcome: Progressing Goal: Respiratory complications will improve Outcome: Progressing Goal: Cardiovascular complication will be avoided Outcome: Progressing   Problem: Activity: Goal: Risk for activity intolerance will decrease Outcome: Progressing   Problem: Nutrition: Goal: Adequate nutrition  will be maintained Outcome: Progressing   Problem: Coping: Goal: Level of anxiety will decrease Outcome: Progressing   Problem: Elimination: Goal: Will not experience complications related to bowel motility Outcome: Progressing Goal: Will not experience complications related to urinary retention Outcome: Progressing   Problem: Pain Managment: Goal: General experience of comfort will improve Outcome: Progressing   Problem: Safety: Goal: Ability to remain free from injury will improve Outcome: Progressing   Problem: Skin Integrity: Goal: Risk for impaired skin integrity will decrease Outcome: Progressing   Problem: Education: Goal: Ability to describe self-care measures that may prevent or decrease complications (Diabetes Survival Skills Education) will improve Outcome: Progressing Goal: Individualized Educational Video(s) Outcome: Progressing   Problem: Coping: Goal: Ability to adjust to condition or change in health will improve Outcome: Progressing   Problem: Fluid Volume: Goal: Ability to maintain a balanced intake and output will improve Outcome: Progressing   Problem: Health Behavior/Discharge Planning: Goal: Ability to identify and utilize available resources and services will improve Outcome: Progressing Goal: Ability to manage health-related needs will improve Outcome: Progressing   Problem: Metabolic: Goal: Ability to maintain appropriate glucose levels will improve Outcome: Progressing   Problem: Nutritional: Goal: Maintenance of adequate nutrition will improve Outcome: Progressing Goal: Progress toward achieving an optimal weight will improve Outcome: Progressing   Problem: Skin Integrity: Goal: Risk for impaired skin integrity will decrease Outcome: Progressing   Problem: Tissue Perfusion: Goal: Adequacy of tissue perfusion will improve Outcome: Progressing

## 2022-09-23 NOTE — Telephone Encounter (Signed)
-----   Message from Gabriel Earing, [place] sent at 09/23/2022  6:49 AM EDT ----- S/p right AKA.  Have pt f/u in 4-5 weeks on [facility] clinic day.  Will also need discussion about left leg amputation.  Thanks

## 2022-09-23 NOTE — TOC Progression Note (Signed)
Transition of Care [facility]) - Progression Note    Patient Details  Name: Lui Bellis MRN: [id number] Date of Birth: Feb 09, 1941  Transition of Care [facility]) CM/SW [place], [place] Phone Number: 09/23/2022, 4:58 PM  Clinical Narrative:     CSW spoke with patient's wife , she was agreeable to expanding SNF search outside of [place].    TOC will provide update once available.    Thurmond Butts, MSW, LCSW Clinical Social Worker    Expected Discharge Plan: Skilled Nursing Facility Barriers to Discharge: SNF Pending bed offer, Continued Medical Work up  Expected Discharge Plan and Services Expected Discharge Plan: [place] In-house Referral: Clinical Social Work     Living arrangements for the past 2 months: Single Family Home                                       Social Determinants of Health (SDOH) Interventions    Readmission Risk Interventions     No data to display

## 2022-09-23 NOTE — Progress Notes (Signed)
Received patient in bed to unit.  Alert and oriented.  Informed consent signed and in chart.   Treatment initiated: 7282 Treatment completed: 1155  Patient tolerated well.  Transported back to the room  Alert, without acute distress.  Hand-off given to patient's nurse.   Access used: Avfistula  Access issues: 2 scabbed areas     Total UF removed: 1.6L  Medication(s) given: Tylenol  Post HD VS: 113/66,94,16,99%,98.0 Post HD weight: 83.7kg   [facility]

## 2022-09-23 NOTE — Telephone Encounter (Signed)
-----   Message from Ulyses Amor, [place] sent at 09/19/2022 10:52 AM EDT ----- S/P right AKA with residual dry gangrene left toes 1 and 2.  F/U in 4 weeks for staple removal.  Dr. Jamse Mead patient so f/u with PA on a Wednesday in 4 weeks

## 2022-09-23 NOTE — Telephone Encounter (Signed)
Patient's wife called to say that she would call back later to schedule this patient's post-op when he is closer to discharge. JLA

## 2022-09-24 DIAGNOSIS — I96 Gangrene, not elsewhere classified: Secondary | ICD-10-CM | POA: Diagnosis not present

## 2022-09-24 LAB — GLUCOSE, CAPILLARY
Glucose-Capillary: 88 mg/dL (ref 70–99)
Glucose-Capillary: 88 mg/dL (ref 70–99)
Glucose-Capillary: 93 mg/dL (ref 70–99)
Glucose-Capillary: 100 mg/dL — ABNORMAL HIGH (ref 70–99)

## 2022-09-24 LAB — CBC
HCT: 24.6 % — ABNORMAL LOW (ref 39.0–52.0)
Hemoglobin: 7.9 g/dL — ABNORMAL LOW (ref 13.0–17.0)
MCH: 27.3 pg (ref 26.0–34.0)
MCHC: 32.1 g/dL (ref 30.0–36.0)
MCV: 85.1 fL (ref 80.0–100.0)
Platelets: 462 10*3/uL — ABNORMAL HIGH (ref 150–400)
RBC: 2.89 MIL/uL — ABNORMAL LOW (ref 4.22–5.81)
RDW: 18.6 % — ABNORMAL HIGH (ref 11.5–15.5)
WBC: 12.1 10*3/uL — ABNORMAL HIGH (ref 4.0–10.5)
nRBC: 0 % (ref 0.0–0.2)

## 2022-09-24 LAB — RENAL FUNCTION PANEL
Albumin: 1.9 g/dL — ABNORMAL LOW (ref 3.5–5.0)
Anion gap: 11 (ref 5–15)
BUN: 38 mg/dL — ABNORMAL HIGH (ref 8–23)
CO2: 28 mmol/L (ref 22–32)
Calcium: 9 mg/dL (ref 8.9–10.3)
Chloride: 96 mmol/L — ABNORMAL LOW (ref 98–111)
Creatinine, Ser: 4.83 mg/dL — ABNORMAL HIGH (ref 0.61–1.24)
GFR, Estimated: 12 mL/min — ABNORMAL LOW (ref >60)
Glucose, Bld: 106 mg/dL — ABNORMAL HIGH (ref 70–99)
Phosphorus: 3.1 mg/dL (ref 2.5–4.6)
Potassium: 3.9 mmol/L (ref 3.5–5.1)
Sodium: 135 mmol/L (ref 135–145)

## 2022-09-24 MED ORDER — SODIUM CHLORIDE 0.9 % IV SOLN
62.5000 mg | INTRAVENOUS | Status: DC
  Administered 2022-09-25: 62.5 mg via INTRAVENOUS
  Filled 2022-09-24 (×2): qty 5

## 2022-09-24 MED ORDER — DARBEPOETIN ALFA 150 MCG/0.3ML IJ SOSY
150.0000 ug | PREFILLED_SYRINGE | INTRAMUSCULAR | Status: DC
  Administered 2022-09-25: 150 ug via SUBCUTANEOUS
  Filled 2022-09-24: qty 0.3

## 2022-09-24 NOTE — Progress Notes (Signed)
[facility] Progress Note   Subjective: Seen in room. Alert but doesn't quite seem to be at baseline mentally. Oriented to person and place. HGB down-ESA with HD tomorrow. No specific C/Os. SNF placement pending.   Objective Vitals:   09/23/22 2021 09/23/22 2344 09/24/22 0402 09/24/22 0853  BP: 112/69 118/60 (!) 109/55 112/66  Pulse: 92 100 100 (!) 103  Resp: '17 16 18 18  '$ Temp: 98.6 F (37 C) 98.6 F (37 C) 98.2 F (36.8 C) 98.4 F (36.9 C)  TempSrc: Oral Oral Oral Oral  SpO2: 98% 98% 96% 100%  Weight:   83.8 kg   Height:       Physical Exam General: Elderly male in NAD Heart: irreg, irreg. Afib on monitor rate 90s.  Lungs: CTAB slightly decreased in bases Abdomen: NABS  Extremities: R AKA with staples L foot with foul odor.  Dialysis Access: L AVF +T/B healing ulcer lower portion of AVF. AVF is pulsatile.     Additional Objective Labs: Basic Metabolic Panel: Recent Labs  Lab 09/22/22 0113 09/23/22 0130 09/24/22 0208  NA 137 134* 135  K 4.4 4.6 3.9  CL 94* 96* 96*  CO2 '27 25 28  '$ GLUCOSE 79 102* 106*  BUN 43* 54* 38*  CREATININE 5.64* 6.34* 4.83*  CALCIUM 9.1 8.9 9.0  PHOS 4.4 4.4 3.1   Liver Function Tests: Recent Labs  Lab 09/22/22 0113 09/23/22 0130 09/24/22 0208  ALBUMIN 1.9* 1.9* 1.9*   No results for input(s): "LIPASE", "AMYLASE" in the last 168 hours. CBC: Recent Labs  Lab 09/18/22 0140 09/18/22 0152 09/19/22 0747 09/20/22 0645 09/21/22 0105 09/22/22 0113 09/23/22 0130 09/24/22 0208  WBC 19.6*   < > 17.1* 16.4* 16.2* 15.0* 13.2* 12.1*  NEUTROABS 16.6*  --  14.4*  --   --   --   --   --   HGB 10.2*   < > 9.1* 9.0* 9.1* 8.7* 8.5* 7.9*  HCT 32.7*   < > 29.4* 28.8* 28.2* 27.7* 26.3* 24.6*  MCV 87.0   < > 86.0 85.7 84.4 84.5 84.6 85.1  PLT 359   < > 353 332 390 436* 437* 462*   < > = values in this interval not displayed.   Blood Culture    Component Value Date/Time   SDES BLOOD SITE NOT SPECIFIED 09/15/2022 1510    SPECREQUEST  09/15/2022 1510    BOTTLES DRAWN AEROBIC AND ANAEROBIC Blood Culture results may not be optimal due to an excessive volume of blood received in culture bottles   CULT  09/15/2022 1510    NO GROWTH 5 DAYS Performed at [facility], [place] [address], [place], [place] [address]    REPTSTATUS 09/20/2022 FINAL 09/15/2022 1510    Cardiac Enzymes: No results for input(s): "CKTOTAL", "CKMB", "CKMBINDEX", "TROPONINI" in the last 168 hours. CBG: Recent Labs  Lab 09/22/22 2120 09/23/22 0608 09/23/22 1718 09/23/22 2137 09/24/22 0621  GLUCAP 143* 79 109* 124* 88   Iron Studies: No results for input(s): "IRON", "TIBC", "TRANSFERRIN", "FERRITIN" in the last 72 hours. '@lablastinr3'$ @ Studies/Results: ECHOCARDIOGRAM COMPLETE  Result Date: 09/22/2022    ECHOCARDIOGRAM REPORT   Patient Name:   Christian Dalton Date of Exam: 09/22/2022 Medical Rec #:  [id number]    Height:       70.0 in Accession #:    [id number]   Weight:       187.4 lb Date of Birth:  October 01, 1941    BSA:  2.030 m Patient Age:    81 years     BP:           106/58 mmHg Patient Gender: M            HR:           87 bpm. Exam Location:  Inpatient Procedure: 2D Echo, Cardiac Doppler, Color Doppler and Intracardiac            Opacification Agent Indications:    Abnormal heart rhythm  History:        Patient has prior history of Echocardiogram examinations, most                 recent 05/14/2022. HFrEF, COPD; Risk Factors:Diabetes,                 Hypertension and Dyslipidemia. ESRD.  Sonographer:    Eartha Inch Referring Phys: [id number] [facility] VINCENT  Sonographer Comments: Image acquisition challenging due to patient body habitus, Image acquisition challenging due to respiratory motion and Image acquisition challenging due to COPD. IMPRESSIONS  1. Left ventricular ejection fraction, by estimation, is 20 to 25%. The left ventricle has severely decreased function. The left ventricle demonstrates global hypokinesis. The  left ventricular internal cavity size was mildly dilated. Left ventricular diastolic parameters are indeterminate.  2. Right ventricular systolic function is low normal. The right ventricular size is normal. Tricuspid regurgitation signal is inadequate for assessing PA pressure.  3. Left atrial size was mildly dilated.  4. The mitral valve is degenerative. Mild to moderate mitral valve regurgitation. No evidence of mitral stenosis.  5. The aortic valve is grossly normal. There is mild calcification of the aortic valve. Aortic valve regurgitation is mild. Aortic valve sclerosis/calcification is present, without any evidence of aortic stenosis.  6. The inferior vena cava is normal in size with greater than 50% respiratory variability, suggesting right atrial pressure of 3 mmHg. Conclusion(s)/Recommendation(s): No left ventricular mural or apical thrombus/thrombi. FINDINGS  Left Ventricle: Left ventricular ejection fraction, by estimation, is 20 to 25%. The left ventricle has severely decreased function. The left ventricle demonstrates global hypokinesis. The left ventricular internal cavity size was mildly dilated. There is no left ventricular hypertrophy. Left ventricular diastolic parameters are indeterminate. Right Ventricle: The right ventricular size is normal. No increase in right ventricular wall thickness. Right ventricular systolic function is low normal. Tricuspid regurgitation signal is inadequate for assessing PA pressure. Left Atrium: Left atrial size was mildly dilated. Right Atrium: Right atrial size was normal in size. Pericardium: There is no evidence of pericardial effusion. Mitral Valve: The mitral valve is degenerative in appearance. Mild mitral annular calcification. Mild to moderate mitral valve regurgitation. No evidence of mitral valve stenosis. Tricuspid Valve: The tricuspid valve is normal in structure. Tricuspid valve regurgitation is trivial. No evidence of tricuspid stenosis. Aortic Valve:  The aortic valve is grossly normal. There is mild calcification of the aortic valve. Aortic valve regurgitation is mild. Aortic valve sclerosis/calcification is present, without any evidence of aortic stenosis. Aortic valve mean gradient measures 6.7 mmHg. Aortic valve peak gradient measures 12.3 mmHg. Aortic valve area, by VTI measures 1.90 cm. Pulmonic Valve: The pulmonic valve was normal in structure. Pulmonic valve regurgitation is trivial. No evidence of pulmonic stenosis. Aorta: The aortic root is normal in size and structure. Ascending aorta measurements are within normal limits for age when indexed to body surface area. Venous: The inferior vena cava is normal in size with greater than 50% respiratory variability,  suggesting right atrial pressure of 3 mmHg. IAS/Shunts: No atrial level shunt detected by color flow Doppler.  LEFT VENTRICLE PLAX 2D LVIDd:         6.00 cm   Diastology LVIDs:         4.60 cm   LV e' medial:    2.68 cm/s LV PW:         0.90 cm   LV E/e' medial:  41.0 LV IVS:        0.85 cm   LV e' lateral:   10.00 cm/s LVOT diam:     2.00 cm   LV E/e' lateral: 11.0 LV SV:         65 LV SV Index:   32 LVOT Area:     3.14 cm  RIGHT VENTRICLE             IVC RV S prime:     10.40 cm/s  IVC diam: 1.50 cm TAPSE (M-mode): 1.9 cm LEFT ATRIUM             Index        RIGHT ATRIUM           Index LA diam:        4.40 cm 2.17 cm/m   RA Area:     15.30 cm LA Vol (A2C):   99.3 ml 48.91 ml/m  RA Volume:   38.80 ml  19.11 ml/m LA Vol (A4C):   50.9 ml 25.07 ml/m LA Biplane Vol: 72.7 ml 35.81 ml/m  AORTIC VALVE AV Area (Vmax):    2.06 cm AV Area (Vmean):   2.12 cm AV Area (VTI):     1.90 cm AV Vmax:           175.39 cm/s AV Vmean:          120.476 cm/s AV VTI:            0.340 m AV Peak Grad:      12.3 mmHg AV Mean Grad:      6.7 mmHg LVOT Vmax:         115.13 cm/s LVOT Vmean:        81.236 cm/s LVOT VTI:          0.206 m LVOT/AV VTI ratio: 0.60  AORTA Ao Root diam: 2.90 cm Ao Asc diam:  3.70 cm MITRAL  VALVE MV Area (PHT): 3.97 cm       SHUNTS MV Decel Time: 191 msec       Systemic VTI:  0.21 m MR Peak grad:    86.1 mmHg    Systemic Diam: 2.00 cm MR Mean grad:    52.0 mmHg MR Vmax:         464.00 cm/s MR Vmean:        342.0 cm/s MR PISA:         0.57 cm MR PISA Eff ROA: 4 mm MR PISA Radius:  0.30 cm MV E velocity: 110.00 cm/s MV A velocity: 107.00 cm/s MV E/A ratio:  1.03 Cherlynn Kaiser MD Electronically signed by Cherlynn Kaiser MD Signature Date/Time: 09/22/2022/5:25:52 PM    Final    Medications:  magnesium sulfate bolus IVPB      (feeding supplement) PROSource Plus  30 mL Oral BID BM   aspirin EC  81 mg Oral Daily   atorvastatin  10 mg Oral Daily   buPROPion  150 mg Oral Daily   calcitRIOL  1.75 mcg Oral Q T,Th,Sa-HD   Chlorhexidine Gluconate Cloth  6 each Topical Q0600   docusate sodium  100 mg Oral Daily   escitalopram  10 mg Oral QHS   heparin  5,000 Units Subcutaneous Q8H   HYDROmorphone  4 mg Oral BID   lactulose  10 g Oral BID   levothyroxine  125 mcg Oral Q0600   linaclotide  290 mcg Oral QPM   midodrine  10 mg Oral Once per day on Tue Thu Sat   montelukast  10 mg Oral QHS   sevelamer carbonate  2,400 mg Oral BID WC   traZODone  25 mg Oral QHS     Dialysis Orders: TTS - [facility]  3 hrs 45 min, BFR 425, DFR 800,  EDW 88.9 kg, 2 K/ 2 Ca LUE AVF  - No Heparin  - Mircera 50 mcg q2wks - last 10/19 - Calcitriol 1.75 mcg PO qHD - Venofer q week      Assessment/Plan: Gangrenous LE Wound infection - Bilateral lower extremity gangrenous wounds. R AKA 10/24 by Dr. Donzetta Matters. No obvious stenosis to LLE- will ultimately need L amputation. To F/U with [facility] as OP.  Hyperkalemia -  Resolved with HD.  Reminded to follow low K diet.  Renal diet ordered.   Chronic ulcer on AVF - Ulcer healing but AVF is pulsatile. Will need F'gram as OP.  ESRD on HD TTS - Back on schedule. Next HD 09/25/22 Hypertension/volume  - BP in goal. Home meds on hold. UF limited by hypotension on HD. Given albumin  and midodrine with HD.  Now on midodrine '10mg'$  TID.  Continue titrate down volume as tolerated. Expect lower EDW s/p amputation. Post wt 09/23/2022 83.8 kg which seems correct.   Anemia of CKD - HGB 10.2 on admission. 7.9 09/24/2022.Give Fe with HD and ESA 09/25/2022. Secondary Hyperparathyroidism -  Ca/Phos ok.  Continue VDRA and binders -  Renvela 3AC TID.  Nutrition - Renal diet w/fluid restrictions. Add protein supp for low albumin.  DM -per PMD Agitation -new this am. Question post anesthesia complication. Now at baseline. Resolved.  Dispo -For SNF placement GOC-Pt elderly with multiple co-morbid conditions. Remains full code. Consult Palliative Care for goals of care.   Nautia Lem H. Sara Keys NP-C 09/24/2022, 9:42 AM  [employer] [phone]

## 2022-09-24 NOTE — TOC Progression Note (Signed)
Transition of Care [facility]) - Progression Note    Patient Details  Name: Christian Dalton MRN: [id number] Date of Birth: 1941/10/18  Transition [facility] CM/SW [place], [place] Phone Number: 09/24/2022, 1:35 PM  Clinical Narrative:     Helene Kelp confirmed bed offer - sent secured message to Renal Navigator to inform he needs temporarily local HD clinic while at rehab.  TOC will continue to follow and assist with discharge planning.  Thurmond Butts, MSW, LCSW Clinical Social Worker    Expected Discharge Plan: Skilled Nursing Facility Barriers to Discharge: SNF Pending bed offer, Continued Medical Work up  Expected Discharge Plan and Services Expected Discharge Plan: [place] In-house Referral: Clinical Social Work     Living arrangements for the past 2 months: Single Family Home                                       Social Determinants of Health (SDOH) Interventions    Readmission Risk Interventions     No data to display

## 2022-09-24 NOTE — Progress Notes (Signed)
Contacted by CSW that pt will be going to [facility] sn[facility] in [place] at d/c. Pt is needing a temporary HD clinic in [place] while pt at rehab. Referral submitted to [facility] admissions for clinic placement. Will assist as needed.   [name] Renal Navigator [phone]

## 2022-09-24 NOTE — TOC Progression Note (Signed)
Transition of Care [facility]) - Progression Note    Patient Details  Name: Christian Dalton MRN: [id number] Date of Birth: June 19, 1941  Transition of Care [facility]) CM/SW [place], [place] Phone Number: 09/24/2022, 11:14 AM  Clinical Narrative:     Spoke with patient's spouse, she accepted bed offer with  [facility].  CSW contacted [facility] Admissions to confirm availability- SNF will call CSW back.  Thurmond Butts, MSW, LCSW Clinical Social Worker    Expected Discharge Plan: Skilled Nursing Facility Barriers to Discharge: SNF Pending bed offer, Continued Medical Work up  Expected Discharge Plan and Services Expected Discharge Plan: [place] In-house Referral: Clinical Social Work     Living arrangements for the past 2 months: Single Family Home                                       Social Determinants of Health (SDOH) Interventions    Readmission Risk Interventions     No data to display

## 2022-09-24 NOTE — Progress Notes (Signed)
Subjective:  The patient reports he is doing well today, with improved RLE pain compared to yesterday. Denies chest pain, shortness of breath, and abdominal pain. He is able to eat without issue. The patient is able to recall he has been admitted for about a week and a half, and is aware that we are awaiting SNF placement.   Per nursing, the patient is on lactulose BID, but has been refusing his second dose daily. Despite only taking lactulose once daily, he had a bowel movement yesterday.   Objective:  Vital signs in last 24 hours: Vitals:   09/23/22 2021 09/23/22 2344 09/24/22 0402 09/24/22 0853  BP: 112/69 118/60 (!) 109/55 112/66  Pulse: 92 100 100 (!) 103  Resp: '17 16 18 18  '$ Temp: 98.6 F (37 C) 98.6 F (37 C) 98.2 F (36.8 C) 98.4 F (36.9 C)  TempSrc: Oral Oral Oral Oral  SpO2: 98% 98% 96% 100%  Weight:   83.8 kg   Height:       Weight change:   Intake/Output Summary (Last 24 hours) at 09/24/2022 1049 Last data filed at 09/24/2022 0900 Gross per 24 hour  Intake 240 ml  Output 1.6 ml  Net 238.4 ml   General: Sitting comfortably in bed eating breakfast; No acute distress Cardiovascular: RRR; No murmurs, rubs, or gallops; warm extremities; 1+ left dp pulse Pulmonary: No respiratory distress; Lungs clear to auscultation bilaterally  Extremities:  - RLE:  R AKA intact with staples; clean and dry; no swelling, erythema, bleeding, or drainage   - LLE:  Warm; 1+ dp pulse; Stable dry gangrene to toes 1 and 2 with no redness, erythema, and drainage  Psych/Neuro: Alert and oriented x3; Cooperative, able to follow commands and answer questions appropriately; Appropriate mood and affect; No focal deficits      Assessment/Plan:  Principal Problem:   Gangrene of both feet (HCC) Active Problems:   ESRD (end stage renal disease) on dialysis (HCC)   Type II diabetes mellitus with renal manifestations (HCC)   HTN (hypertension)   HLD (hyperlipidemia)   COPD (chronic  obstructive pulmonary disease) (HCC)   HFrEF (heart failure with reduced ejection fraction) (HCC)   Christian Dalton is a 81 y.o. male with past medical history of ESRD on T/Th/S HD, T2DM, HTN, CAD, HLD, hypothyroidism, depression, COPD, HFrEF, and peripheral artery disease presenting with worsening bilateral foot ischemic necrosis, status post right AKA performed Tuesday, 10/24.     #Bilateral foot ischemic necrosis  The patient reports improved RLE pain. He is medically stable for discharge and per social work, his wife has accepted a bed offer with [facility].  - Dilaudid 4 mg BID for pain control - Aspirin '81mg'$  and atorvastatin '80mg'$  daily  - Dispo to [facility] for rehabilitation - Heparin TID for VTE prophylaxis   #History of HFrEF #Frequent PVCs - resolved The patient denies any further chest pain and had a normal cardiovascular exam today. He had frequent PVCs and bigeminy on telemetry two days ago, but has since resolved. The patient is prescribed coreg 3.125 mg BID and lisinopril 2.5 mg daily for HFrEF, however, these have been held during admission due to hypotension during HD requiring midodrine and albumin on dialysis days. These were low doses, so will likely discontinue coreg and lisinopril at discharge due to ongoing hypotension with HD. He has follow-up with cardiology in December to reassess.   #Hospital associated delirium - resolved The patient is sitting up in bed eating breakfast, cooperative, and answering  questions appropriately. He is A&O x3 and is able to sustain attention and engage in conversation. He is able to easily recall the timing of his admission and the fact that we are awaiting SNF placement. He appears much improved from Sunday, but given delirium waxes and wanes, will continue to monitor.  - Delirium precautions - Trazodone '25mg'$  nightly   #T2DM # hypoglycemia  A1c 6.0 earlier this month. His fasting blood glucose today is 88. He has not been receiving insulin,  and has not required any further D50 in the past 24 hours. Still tolerating PO well.  - D50 prn for hypoglycemia  - CBG checks every 4 hours   #Hypotension #ESRD on hemodialysis  The patient is normotensive at 114/72. Electrolytes today are WNL. Patient is back on T/Th/Sat schedule and will be dialyzed tomorrow.  Will continue to monitor BMP.  - Renal diet  - Midodrine 10 TID with meals  - Renvela 800 BID with meals      #COPD Respiratory status remains stable on RA, and patient denies any further shortness of breath.  - Continue singulair '10mg'$  QHS, albuterol nebs q6h prn   #Constipation  The patient had a bowel movement yesterday, despite only taking lactulose once daily. Will change lactulose to once daily.  - Lactulose 10 g daily for constipation      LOS: 9 days   Christian Dalton, Medical Student 09/24/2022, 10:49 AM

## 2022-09-24 NOTE — Progress Notes (Signed)
Occupational Therapy Treatment Patient Details Name: Christian Dalton MRN: [id number] DOB: [birth year] Today's Date: 09/24/2022   History of present illness Pt is a 81 y/o male presenting on 09/15/22 with worsening bilateral foot gangrene, s/p R AKA 10/24. PMH includes: ESRD on TTS HD, DM2, HTN, CAD, depression, COPD, PAD.   OT comments  Patient supine in bed and agreeable to OT session.  Requires increased time to follow commands, pt very distracted and requires constant re-direction with poor problem solving and awareness.  He completes bed mobility with mod assist, attempted lateral scooting towards HOB with max assist but limited advancement, and grooming at EOB with min guard.  He does not understand why he cannot sit EOB alone. Educated on phantom pain in [place], as apologizing for kicking therapist multiple times. Continue to recommend SNF at this time. Will follow acutely.    Recommendations for follow up therapy are one component of a multi-disciplinary discharge planning process, led by the attending physician.  Recommendations may be updated based on patient status, additional functional criteria and insurance authorization.    Follow Up Recommendations  Skilled nursing-short term rehab (<3 hours/day)    Assistance Recommended at Discharge Frequent or constant Supervision/Assistance  Patient can return home with the following  Two people to help with walking and/or transfers;A lot of help with bathing/dressing/bathroom;Assistance with cooking/housework;Help with stairs or ramp for entrance;Assist for transportation   Equipment Recommendations  Other (comment) (defer)    Recommendations for Other Services      Precautions / Restrictions Precautions Precautions: Fall Restrictions Weight Bearing Restrictions: Yes RLE Weight Bearing: Non weight bearing LLE Weight Bearing: Weight bearing as tolerated       Mobility Bed Mobility Overal bed mobility: Needs Assistance Bed Mobility:  Rolling, Sidelying to Sit Rolling: Min assist Sidelying to sit: Mod assist, HOB elevated       General bed mobility comments: patient requiring cueing for technique to roll reaching for rails towards R side, transitioned to EOB with request to elevated HOB to max with mod assist to bring trunk up and scoot hips forward    Transfers Overall transfer level: Needs assistance                Lateral/Scoot Transfers: Max assist General transfer comment: lateral scooting at EOB attempted with minimal progress towards HOB, max cueing for sequencing and technique     Balance Overall balance assessment: Needs assistance Sitting-balance support: Single extremity supported, No upper extremity supported, Feet supported Sitting balance-Leahy Scale: Fair Sitting balance - Comments: min guard dynamically during ADLs with preference to BUE support                                   ADL either performed or assessed with clinical judgement   ADL Overall ADL's : Needs assistance/impaired     Grooming: Wash/dry face;Min guard;Sitting               Lower Body Dressing: Total assistance;Sitting/lateral leans     Toilet Transfer Details (indicate cue type and reason): deferred         Functional mobility during ADLs: Moderate assistance General ADL Comments: limited to EOB    Extremity/Trunk Assessment              Vision       Perception     Praxis      Cognition Arousal/Alertness: Awake/alert Behavior During Therapy: Flat affect Overall  Cognitive Status: Impaired/Different from baseline Area of Impairment: Following commands, Awareness, Problem solving, Safety/judgement                       Following Commands: Follows one step commands consistently, Follows one step commands with increased time, Follows multi-step commands inconsistently Safety/Judgement: Decreased awareness of safety, Decreased awareness of deficits Awareness:  Emergent Problem Solving: Slow processing, Decreased initiation, Difficulty sequencing, Requires verbal cues General Comments: patient with decreased awareness of safety, questioning why he can't sit EOB alone.  Patient also with decreased awareness of R LE, voicing several times "I didn't mean to kick you" referring to his R LE and reviewed AKA.        Exercises      Shoulder Instructions       General Comments VSS, RN and NT present upon exit with patient seated EOB    Pertinent Vitals/ Pain       Pain Assessment Pain Assessment: Faces Faces Pain Scale: Hurts a little bit Pain Location: R residual limb, generalized Pain Descriptors / Indicators: Grimacing (phantom pain) Pain Intervention(s): Monitored during session, Repositioned, Limited activity within patient's tolerance  Home Living                                          Prior Functioning/Environment              Frequency  Min 2X/week        Progress Toward Goals  OT Goals(current goals can now be found in the care plan section)  Progress towards OT goals: Progressing toward goals  Acute Rehab OT Goals Patient Stated Goal: less pain OT Goal Formulation: With patient Time For Goal Achievement: 10/01/22 Potential to Achieve Goals: Good  Plan Frequency remains appropriate;Discharge plan needs to be updated    Co-evaluation                 AM-PAC OT "6 Clicks" Daily Activity     Outcome Measure   Help from another person eating meals?: None Help from another person taking care of personal grooming?: A Little Help from another person toileting, which includes using toliet, bedpan, or urinal?: Total Help from another person bathing (including washing, rinsing, drying)?: A Lot Help from another person to put on and taking off regular upper body clothing?: A Little Help from another person to put on and taking off regular lower body clothing?: Total 6 Click Score: 14    End  of Session    OT Visit Diagnosis: Other abnormalities of gait and mobility (R26.89);Muscle weakness (generalized) (M62.81);Pain Pain - Right/Left: Right Pain - part of body: Leg   Activity Tolerance Patient tolerated treatment well   Patient Left with call bell/phone within reach;with nursing/sitter in room;Other (comment) (seated EOB)   Nurse Communication Mobility status        Time: 8299-3716 OT Time Calculation (min): 26 min  Charges: OT General Charges $OT Visit: 1 Visit OT Treatments $Self Care/Home Management : 23-37 mins  [place] Office [phone]    Delight Stare 09/24/2022, 11:48 AM

## 2022-09-24 NOTE — Progress Notes (Signed)
Received a consult for palliative care for [place]. Reached out to primary team and plan to defer palliative care consult to the outpatient setting given the patient is undergoing discharge process with bed offer accepted at [facility].    Idamae Schuller, MD Tillie Rung. [facility] Internal Medicine Residency, PGY-2

## 2022-09-25 DIAGNOSIS — I96 Gangrene, not elsewhere classified: Secondary | ICD-10-CM | POA: Diagnosis not present

## 2022-09-25 LAB — RENAL FUNCTION PANEL
Albumin: 1.9 g/dL — ABNORMAL LOW (ref 3.5–5.0)
Anion gap: 12 (ref 5–15)
BUN: 53 mg/dL — ABNORMAL HIGH (ref 8–23)
CO2: 28 mmol/L (ref 22–32)
Calcium: 9.4 mg/dL (ref 8.9–10.3)
Chloride: 98 mmol/L (ref 98–111)
Creatinine, Ser: 5.67 mg/dL — ABNORMAL HIGH (ref 0.61–1.24)
GFR, Estimated: 10 mL/min — ABNORMAL LOW
Glucose, Bld: 105 mg/dL — ABNORMAL HIGH (ref 70–99)
Phosphorus: 3.6 mg/dL (ref 2.5–4.6)
Potassium: 4.1 mmol/L (ref 3.5–5.1)
Sodium: 138 mmol/L (ref 135–145)

## 2022-09-25 LAB — CBC
HCT: 24.6 % — ABNORMAL LOW (ref 39.0–52.0)
Hemoglobin: 7.9 g/dL — ABNORMAL LOW (ref 13.0–17.0)
MCH: 27.6 pg (ref 26.0–34.0)
MCHC: 32.1 g/dL (ref 30.0–36.0)
MCV: 86 fL (ref 80.0–100.0)
Platelets: 435 10*3/uL — ABNORMAL HIGH (ref 150–400)
RBC: 2.86 MIL/uL — ABNORMAL LOW (ref 4.22–5.81)
RDW: 18.7 % — ABNORMAL HIGH (ref 11.5–15.5)
WBC: 11.4 10*3/uL — ABNORMAL HIGH (ref 4.0–10.5)
nRBC: 0 % (ref 0.0–0.2)

## 2022-09-25 LAB — GLUCOSE, CAPILLARY
Glucose-Capillary: 109 mg/dL — ABNORMAL HIGH (ref 70–99)
Glucose-Capillary: 70 mg/dL (ref 70–99)
Glucose-Capillary: 91 mg/dL (ref 70–99)

## 2022-09-25 MED ORDER — HYDROMORPHONE HCL 2 MG PO TABS
2.0000 mg | ORAL_TABLET | Freq: Two times a day (BID) | ORAL | Status: DC
  Administered 2022-09-25 – 2022-09-28 (×7): 2 mg via ORAL
  Filled 2022-09-25 (×7): qty 1

## 2022-09-25 MED ORDER — HYDROMORPHONE HCL 2 MG PO TABS: 2.0000 mg | ORAL_TABLET | Freq: Two times a day (BID) | ORAL | Status: DC

## 2022-09-25 NOTE — Progress Notes (Signed)
Subjective:  The patient reports he is doing well today, but feels sleepy. His pain is well controlled. He has been able to tolerate PO without nausea and vomiting.   Objective:  Vital signs in last 24 hours: Vitals:   09/25/22 0823 09/25/22 0837 09/25/22 0900 09/25/22 0930  BP: (!) 118/56 (!) 116/94 (!) 103/54 127/76  Pulse: 98 89 90 (!) 101  Resp: '12 12 18 14  '$ Temp: 98.6 F (37 C)     TempSrc:      SpO2: 98% 99%    Weight: 85 kg     Height:       Weight change:   Intake/Output Summary (Last 24 hours) at 09/25/2022 1006 Last data filed at 09/24/2022 1923 Gross per 24 hour  Intake 370 ml  Output --  Net 370 ml   General: Sleepy but arousable, resting with eyes closed in HD; No acute distress Cardiovascular: RRR; No murmurs, rubs, or gallops; warm extremities; 1+ left dp pulse Pulmonary: Normal respiratory effort; Lungs clear to auscultation anteriorly  Extremities: - RLE: Small amount of blood oozing from central R AKA wound; clean; no swelling, erythema, or drainage   - LLE:  Warm; 1+ dp pulse; Stable dry gangrene to toes 1 and 2 with no erythema or drainage Psych/Neuro: Sleepy; Cooperative, able to answer questions appropriately; No focal deficits     Assessment/Plan:  Principal Problem:   Gangrene of both feet (HCC) Active Problems:   ESRD (end stage renal disease) on dialysis (HCC)   Type II diabetes mellitus with renal manifestations (HCC)   HTN (hypertension)   HLD (hyperlipidemia)   COPD (chronic obstructive pulmonary disease) (HCC)   HFrEF (heart failure with reduced ejection fraction) (HCC)   Christian Dalton is a 81 y.o. male with past medical history of ESRD on T/Th/S HD, T2DM, HTN, CAD, HLD, hypothyroidism, depression, COPD, HFrEF, and peripheral artery disease presenting with worsening bilateral foot ischemic necrosis, status post right AKA performed Tuesday, 10/24.     #Bilateral foot ischemic necrosis  The patient reports well controlled RLE pain;  however, he appears overly sleepy on exam. Plan to hold his morning dose of Dilaudid and half his Dilaudid BID dose to avoid further sedation and to prepare for pending discharge. He is medically stable for discharge and his wife has accepted a bed offer with [facility]. Per social work, we are waiting for his renal navigator to help coordinate a temporary HD clinic for outpatient dialysis.  - Decrease Dilaudid to 2 mg BID for pain control - Aspirin '81mg'$  and atorvastatin '80mg'$  daily  - Dispo to [facility] for rehabilitation, awaiting HD clinic  - Heparin TID for VTE prophylaxis  #History of HFrEF #Frequent PVCs - resolved The patient denies any further chest pain and had a normal cardiovascular exam today. Will likely discontinue coreg and lisinopril at discharge due to ongoing hypotension with HD. He has follow-up with cardiology in December to reassess.   #Hospital associated delirium  The patient was sleepy and less interactive today, so his Dilaudid has been halved to avoid further sedation. Yesterday afternoon, he appeared easily distracted and a little disoriented. We discussed with the patient and his nurse good sleep hygiene, including avoiding naps during the day and keeping his hearing aids in to keep him aware of his surroundings. He is cooperative and still appears improved from Sunday, so will continue to monitor and continue Trazodone. - Delirium precautions - Trazodone '25mg'$  nightly  #T2DM # hypoglycemia  Fasting blood glucose today  is 109. He has not been receiving insulin, and has not required any further D50 in the past 24 hours. Still tolerating PO well.  - D50 prn for hypoglycemia  - CBG checks every 4 hours   #Hypotension #ESRD on hemodialysis  The patient's blood pressure today is 105/69, which is at his baseline for HD days; he has received midodrine this morning for blood pressure support. Electrolytes today are WNL. Patient is back on T/Th/Sat schedule and is being  dialyzed today.  Will continue to monitor BMP.  - Renal diet  - Midodrine 10 TID with meals  - Renvela 800 BID with meals   #COPD Respiratory status remains stable on RA, and patient denies any further shortness of breath.  - Continue singulair '10mg'$  QHS, albuterol nebs q6h prn   #Constipation  The patient had a bowel movement two days ago. - Lactulose 10 g daily for constipation          LOS: 10 days   Paulo Fruit, Medical Student 09/25/2022, 10:06 AM

## 2022-09-25 NOTE — Progress Notes (Signed)
   09/25/22 1245  Vitals  Temp 97.7 F (36.5 C)  Pulse Rate 98  Resp (!) 26  BP 108/61  SpO2 98 %  O2 Device Room Air  Weight 82.5 kg  Type of Weight Post-Dialysis  Oxygen Therapy  Patient Activity (if Appropriate) In bed  Post Treatment  Dialyzer Clearance Clear  Duration of HD Treatment -hour(s) 3.5 hour(s)  Hemodialysis Intake (mL) 0 mL  Liters Processed 64.7  Fluid Removed 2500 mL  Tolerated HD Treatment Yes  AVG/AVF Arterial Site Held (minutes) 8 minutes  AVG/AVF Venous Site Held (minutes) 8 minutes   Received patient in bed to unit.  Alert and oriented.  Informed consent signed and in chart.     Patient tolerated well.  Transported back to the room  Alert, without acute distress.  Hand-off given to patient's nurse.   Access used: AVF Access issues: none    Na'Shaminy T Ab[facility]

## 2022-09-25 NOTE — Progress Notes (Signed)
Pt came back to rm 22 from HD. Re initiated tele. VSS. Call bell within reach.   Lavenia Atlas, RN

## 2022-09-25 NOTE — Progress Notes (Signed)
Pt's referral being reviewed by [place] for a possible TTS schedule. Awaiting medical clearance and possible chair time. Update provided to CSW.   [name] Renal Navigator [phone]

## 2022-09-25 NOTE — Progress Notes (Signed)
    I evaluated patient on dialysis his right above-knee amputation wound is healing well without any evidence of infection or drainage.  Freya Zobrist C. Donzetta Matters, MD [facility] Office: [phone] Pager: [phone]

## 2022-09-25 NOTE — Progress Notes (Signed)
[facility] Progress Note   Subjective: Seen prior to HD. VERY lethargic but oriented to person and place. Pain medication frequency decreased. Foul odor L foot.   Objective Vitals:   09/24/22 1938 09/24/22 2312 09/25/22 0314 09/25/22 0700  BP: 117/61 128/78 124/63 114/60  Pulse: 100 (!) 102 (!) 109 94  Resp: '20 19 17 20  '$ Temp: 98.6 F (37 C) 98.4 F (36.9 C) 98.1 F (36.7 C)   TempSrc: Oral Oral Oral   SpO2: 96% 97% 96% 95%  Weight:      Height:       Physical Exam General: Elderly male in NAD Heart: irreg, irreg. Afib on monitor rate 90s.  Lungs: CTAB slightly decreased in bases Abdomen: NABS  Extremities: R AKA with staples L foot with foul odor.  Dialysis Access: L AVF +T/B healing ulcer lower portion of AVF. AVF is pulsatile.     Additional Objective Labs: Basic Metabolic Panel: Recent Labs  Lab 09/23/22 0130 09/24/22 0208 09/25/22 0118  NA 134* 135 138  K 4.6 3.9 4.1  CL 96* 96* 98  CO2 '25 28 28  '$ GLUCOSE 102* 106* 105*  BUN 54* 38* 53*  CREATININE 6.34* 4.83* 5.67*  CALCIUM 8.9 9.0 9.4  PHOS 4.4 3.1 3.6   Liver Function Tests: Recent Labs  Lab 09/23/22 0130 09/24/22 0208 09/25/22 0118  ALBUMIN 1.9* 1.9* 1.9*   No results for input(s): "LIPASE", "AMYLASE" in the last 168 hours. CBC: Recent Labs  Lab 09/19/22 0747 09/20/22 0645 09/21/22 0105 09/22/22 0113 09/23/22 0130 09/24/22 0208 09/25/22 0701  WBC 17.1*   < > 16.2* 15.0* 13.2* 12.1* 11.4*  NEUTROABS 14.4*  --   --   --   --   --   --   HGB 9.1*   < > 9.1* 8.7* 8.5* 7.9* 7.9*  HCT 29.4*   < > 28.2* 27.7* 26.3* 24.6* 24.6*  MCV 86.0   < > 84.4 84.5 84.6 85.1 86.0  PLT 353   < > 390 436* 437* 462* 435*   < > = values in this interval not displayed.   Blood Culture    Component Value Date/Time   SDES BLOOD SITE NOT SPECIFIED 09/15/2022 1510   SPECREQUEST  09/15/2022 1510    BOTTLES DRAWN AEROBIC AND ANAEROBIC Blood Culture results may not be optimal due to an  excessive volume of blood received in culture bottles   CULT  09/15/2022 1510    NO GROWTH 5 DAYS Performed at [facility], [place] [address] [place], [place] [address]    REPTSTATUS 09/20/2022 FINAL 09/15/2022 1510    Cardiac Enzymes: No results for input(s): "CKTOTAL", "CKMB", "CKMBINDEX", "TROPONINI" in the last 168 hours. CBG: Recent Labs  Lab 09/24/22 0621 09/24/22 1108 09/24/22 1631 09/24/22 2112 09/25/22 0609  GLUCAP 88 88 93 100* 109*   Iron Studies: No results for input(s): "IRON", "TIBC", "TRANSFERRIN", "FERRITIN" in the last 72 hours. '@lablastinr3'$ @ Studies/Results: No results found. Medications:  ferric gluconate (FERRLECIT) IVPB     magnesium sulfate bolus IVPB      (feeding supplement) PROSource Plus  30 mL Oral BID BM   aspirin EC  81 mg Oral Daily   atorvastatin  10 mg Oral Daily   buPROPion  150 mg Oral Daily   calcitRIOL  1.75 mcg Oral Q T,Th,Sa-HD   Chlorhexidine Gluconate Cloth  6 each Topical Q0600   darbepoetin (ARANESP) injection - DIALYSIS  150 mcg Subcutaneous Q Thu-1800   docusate sodium  100 mg Oral Daily   escitalopram  10 mg Oral QHS   heparin  5,000 Units Subcutaneous Q8H   HYDROmorphone  2 mg Oral BID   lactulose  10 g Oral BID   levothyroxine  125 mcg Oral Q0600   linaclotide  290 mcg Oral QPM   midodrine  10 mg Oral Once per day on Tue Thu Sat   montelukast  10 mg Oral QHS   sevelamer carbonate  2,400 mg Oral BID WC   traZODone  25 mg Oral QHS     Dialysis Orders: TTS - [facility]  3 hrs 45 min, BFR 425, DFR 800,  EDW 88.9 kg, 2 K/ 2 Ca LUE AVF  - No Heparin  - Mircera 50 mcg q2wks - last 10/19 - Calcitriol 1.75 mcg PO qHD - Venofer q week      Assessment/Plan: Gangrenous LE Wound infection - Bilateral lower extremity gangrenous wounds. R AKA 10/24 by Dr. Donzetta Matters. No obvious stenosis to LLE- will ultimately need L amputation. To F/U with [facility] as OP.  Hyperkalemia -  Resolved with HD.  Reminded to follow low K diet.  Renal diet  ordered.   Chronic ulcer on AVF - Ulcer healing but AVF is pulsatile. Will need F'gram as OP.  ESRD on HD TTS - Back on schedule. Next HD 09/25/22 Hypertension/volume  - BP in goal. Home meds on hold. UF limited by hypotension on HD. Given albumin and midodrine with HD.  Now on midodrine '10mg'$  TID.  Continue titrate down volume as tolerated. Expect lower EDW s/p amputation. Post wt 09/23/2022 83.8 kg which seems correct.   Anemia of CKD - HGB 10.2 on admission. 7.9 09/24/2022.Give Fe with HD and ESA 09/25/2022. Secondary Hyperparathyroidism -  Ca/Phos ok.  Continue VDRA and binders -  Renvela 3AC TID.  Nutrition - Renal diet w/fluid restrictions. Add protein supp for low albumin.  DM -per PMD Agitation -new this am. Question post anesthesia complication. Now at baseline. Resolved.  Dispo -For SNF placement GOC-Pt elderly with multiple co-morbid conditions. Remains full code. Consult Palliative Care for goals of care.     Khian Remo H. Bless Lisenby NP-C 09/25/2022, 7:50 AM  [employer] [phone]

## 2022-09-26 DIAGNOSIS — I96 Gangrene, not elsewhere classified: Secondary | ICD-10-CM | POA: Diagnosis not present

## 2022-09-26 LAB — RENAL FUNCTION PANEL
Albumin: 1.9 g/dL — ABNORMAL LOW (ref 3.5–5.0)
Anion gap: 10 (ref 5–15)
BUN: 42 mg/dL — ABNORMAL HIGH (ref 8–23)
CO2: 29 mmol/L (ref 22–32)
Calcium: 9.1 mg/dL (ref 8.9–10.3)
Chloride: 98 mmol/L (ref 98–111)
Creatinine, Ser: 4.25 mg/dL — ABNORMAL HIGH (ref 0.61–1.24)
GFR, Estimated: 13 mL/min — ABNORMAL LOW (ref >60)
Glucose, Bld: 113 mg/dL — ABNORMAL HIGH (ref 70–99)
Phosphorus: 2.7 mg/dL (ref 2.5–4.6)
Potassium: 3.6 mmol/L (ref 3.5–5.1)
Sodium: 137 mmol/L (ref 135–145)

## 2022-09-26 LAB — GLUCOSE, CAPILLARY
Glucose-Capillary: 100 mg/dL — ABNORMAL HIGH (ref 70–99)
Glucose-Capillary: 153 mg/dL — ABNORMAL HIGH (ref 70–99)
Glucose-Capillary: 135 mg/dL — ABNORMAL HIGH (ref 70–99)
Glucose-Capillary: 124 mg/dL — ABNORMAL HIGH (ref 70–99)

## 2022-09-26 LAB — CBC
HCT: 25.3 % — ABNORMAL LOW (ref 39.0–52.0)
Hemoglobin: 7.7 g/dL — ABNORMAL LOW (ref 13.0–17.0)
MCH: 26.6 pg (ref 26.0–34.0)
MCHC: 30.4 g/dL (ref 30.0–36.0)
MCV: 87.2 fL (ref 80.0–100.0)
Platelets: 480 10*3/uL — ABNORMAL HIGH (ref 150–400)
RBC: 2.9 MIL/uL — ABNORMAL LOW (ref 4.22–5.81)
RDW: 19.4 % — ABNORMAL HIGH (ref 11.5–15.5)
WBC: 10.9 10*3/uL — ABNORMAL HIGH (ref 4.0–10.5)
nRBC: 0 % (ref 0.0–0.2)

## 2022-09-26 NOTE — Discharge Summary (Incomplete)
Name: Christian Dalton MRN: [id number] DOB: February 24, 1941 81 y.o. PCP: Maris Berger, MD  Date of Admission: 09/15/2022 11:16 AM Date of Discharge: No discharge date for patient encounter. Attending Physician: Axel Filler, *  Discharge Diagnosis: 1. Principal Problem:   Gangrene of both feet ([place]) Active Problems:   ESRD (end stage renal disease) on dialysis ([place])   Type II diabetes mellitus with renal manifestations (HCC)   HTN (hypertension)   HLD (hyperlipidemia)   COPD (chronic obstructive pulmonary disease) (HCC)   HFrEF (heart failure with reduced ejection fraction) (HCC)  ***  Discharge Medications: Allergies as of 09/26/2022       Reactions   Avelox [moxifloxacin Hcl] Other (See Comments)   Hallucinations   Gabapentin Other (See Comments)   Caused staggering and red feet   Metoprolol Other (See Comments)   Dropped pulse too low; was taken off of this by MD   Quinolones Other (See Comments)   unknown   Shellfish Allergy Nausea And Vomiting, Other (See Comments)   Made patient VERY nauseous and he developed stomach cramps   Sulfa Antibiotics Other (See Comments)   Allergy is from childhood (reaction not recalled)   Tetracyclines & Related Other (See Comments)   Blisters on hands and arms   Adhesive [tape] Itching, Rash   Paper tape only- skin cannot tolerate "heavy" tapes     Med Rec must be completed prior to using this [facility]***       Disposition and follow-up:   Christian Dalton was discharged from [facility] in {DISCHARGE CONDITION:19696} condition.  At the hospital follow up visit please address:  1.  ***  2.  Labs / imaging needed at time of follow-up: ***  3.  Pending labs/ test needing follow-up: ***  Follow-up Appointments:  Follow-up Information     Waynetta Sandy, MD Follow up in 4 week(s).   Specialties: Vascular Surgery, Cardiology Why: Office will call you to arrange your appt (sent) Contact  information: [address] [place] [address] [place] by problem list:  #Bilateral foot ischemic necrosis  The patient presented to the ED on Monday, 10/23 for worsening pain of his bilateral foot ischemic necrosis, right greater than left. He was previously scheduled for R AKA on Friday, 10/27. Vascular surgery assessed the patient and scheduled R AKA for Tuesday, 10/24. He was started on Vancomycin for possible soft tissue infection prior to surgery and blood cultures were drawn. He was also dialyzed on Monday, 10/23, in anticipation of surgery. The procedure was tolerated well, and his RLE healed without complications. He was titrated up to Dilaudid 4 mg BID for pain, with Dilaudid 1 mg IV for breakthrough pain. His lipitor 10 mg and aspirin 81 mg were continued for PAD. On 10/26, his LLE felt cold and a left dp pulse was not palpable. We ordered LLE doppler that showed no obvious evidence of stenosis. PT recommended discharge to SNF and vascular surgery plans for follow-up in 4-5 weeks for LLE evaluation. Medication changes at discharge include: stop Percocet, stop Trazodone 25 mg at night, stop lisinopril 2.5 mg, stop coreg 3.125 mg BID, continue dialysis medications including midodrine 10 mg, calcitriol 1.75 mcg, and continue dilaudid 2 mg BID for pain.   #ESRD on HD He returned to his normal HD schedule of T/Th/Sat and received midodrine 10 mg on dialysis days due to hypotension.   #  Delirium   On Sunday, 10/29, he was found to be confused and agitated, and he was started on Haldol 1 mg PRN for agitation. This was changed to Trazodone 25 mg at bed time, and the next day he appeared to be oriented and appropriate.   #T2DM He did not receive insulin during this admission, however, his blood sugar decreased to 49 on Saturday, 10/28. He received one dose of D50 50 mL solution and his blood sugar corrected. His PO intake increased, and he had no  further hypoglycemia.     Discharge Exam:   BP 126/68 (BP Location: Right Arm)   Pulse 98   Temp 98.2 F (36.8 C) (Oral)   Resp 15   Ht '5\' 10"'$  (1.778 m)   Wt 82.1 kg   SpO2 97%   BMI 25.97 kg/m  Discharge exam: ***  Pertinent Labs, Studies, and Procedures:  ***  Discharge Instructions:   Signed: Paulo Fruit, Medical Student 09/26/2022, 11:07 AM   Pager: '@[email or web address] @

## 2022-09-26 NOTE — Progress Notes (Addendum)
Subjective:  The patient reports his pain is well controlled today. Denies shortness of breath and chest pain. He has been able to tolerate food without difficulty. He was able to recall that we have been working with his wife on SNF placement and he is status post R AKA.   Objective:  Vital signs in last 24 hours: Vitals:   09/25/22 2334 09/26/22 0504 09/26/22 0805 09/26/22 1130  BP: (!) 109/52 104/62 126/68 109/61  Pulse: 96 100 98 94  Resp: '19 14 15 20  '$ Temp: 98.5 F (36.9 C) 98.3 F (36.8 C) 98.2 F (36.8 C) 98.1 F (36.7 C)  TempSrc: Oral Oral Oral   SpO2: 96% 96% 97% 97%  Weight:  82.1 kg    Height:       Weight change:   Intake/Output Summary (Last 24 hours) at 09/26/2022 1153 Last data filed at 09/25/2022 1731 Gross per 24 hour  Intake 580 ml  Output 2500 ml  Net -1920 ml   General: Appears to be laying comfortably in bed; No acute distress Cardiovascular: RRR; No murmurs, rubs, or gallops; warm extremities  Pulmonary: Normal respiratory effort; Lungs clear to auscultation  Extremities: - RLE: Dried blood to central R AKA wound; clean; no swelling, erythema, drainage, or active bleeding.   - LLE:  Warm; Stable dry gangrene to toes 1 and 2 with no erythema or drainage Psych/Neuro: A&O x3; Cooperative, able to answer questions appropriately; No focal deficits  Assessment/Plan:  Principal Problem:   Gangrene of both feet (HCC) Active Problems:   ESRD (end stage renal disease) on dialysis (HCC)   Type II diabetes mellitus with renal manifestations (HCC)   HTN (hypertension)   HLD (hyperlipidemia)   COPD (chronic obstructive pulmonary disease) (HCC)   HFrEF (heart failure with reduced ejection fraction) (HCC)  Christian Dalton is a 81 y.o. male with past medical history of ESRD on T/Th/S HD, T2DM, HTN, CAD, HLD, hypothyroidism, depression, COPD, HFrEF, and peripheral artery disease presenting with worsening bilateral foot ischemic necrosis, status post right AKA  performed Tuesday, 10/24.    #Bilateral foot ischemic necrosis  The patient reports well controlled RLE pain. He has accepted a bed at [facility]. Per social work, we are waiting for [facility] admissions for HD clinic placement.  - Dilaudid to 2 mg BID  - Aspirin '81mg'$  and atorvastatin '80mg'$  daily  - Dispo to [facility] for rehabilitation, awaiting HD clinic   #Hospital associated delirium  Today the patient appeared less sleepy and A&O x3. He was able to recall that we have been working with his wife on SNF placement and he is status post R AKA. Will continue to monitor and continue Trazodone. - Delirium precautions - Trazodone '25mg'$  nightly  #T2DM # hypoglycemia  Fasting blood glucose today is 113. He has not been receiving insulin, and has not required any further D50 in the past 24 hours. Still tolerating PO well.  - D50 prn for hypoglycemia  - CBG checks every 4 hours   #Hypotension #ESRD on hemodialysis  The patient's blood pressure today is 109/61. Electrolytes today are WNL. Patient is back on T/Th/Sat schedule and will be dialyzed tomorrow.  Will continue to monitor BMP.  - Renal diet  - Midodrine 10 TID with meals  - Renvela 800 BID with meals    #COPD Respiratory status remains stable on RA, and patient denies any further shortness of breath.  - Continue singulair '10mg'$  QHS, albuterol nebs q6h prn   #Constipation  The  patient had a bowel movement three days ago. - Lactulose 10 g daily for constipation    Heparin TID for VTE prophylaxis    LOS: 11 days   Paulo Fruit, Medical Student 09/26/2022, 11:53 AM

## 2022-09-26 NOTE — Progress Notes (Signed)
Occupational Therapy Treatment Patient Details Name: Christian Dalton MRN: [id number] DOB: 11-28-1940 Today's Date: 09/26/2022   History of present illness Pt is a 81 y/o male presenting on 09/15/22 with worsening bilateral foot gangrene, s/p R AKA 10/24. PMH includes: ESRD on TTS HD, DM2, HTN, CAD, depression, COPD, PAD.   OT comments  Session focused on EOB activities with education on LB dressing strategies. Pt able to demo lateral leans easily to simulate skills for LB ADLs despite RUE shoulder flexion limitations. Pt declined further activities due to pain and need for bedpan. Assisted pt onto bedpan with Min A for rolling and urinal placement as well per pt request. Continue to rec SNF rehab at [place].   Recommendations for follow up therapy are one component of a multi-disciplinary discharge planning process, led by the attending physician.  Recommendations may be updated based on patient status, additional functional criteria and insurance authorization.    Follow Up Recommendations  Skilled nursing-short term rehab (<3 hours/day)    Assistance Recommended at Discharge Frequent or constant Supervision/Assistance  Patient can return home with the following  Two people to help with walking and/or transfers;A lot of help with bathing/dressing/bathroom;Assistance with cooking/housework;Help with stairs or ramp for entrance;Assist for transportation   Equipment Recommendations  Wheelchair (measurements OT);Wheelchair cushion (measurements OT)    Recommendations for Other Services      Precautions / Restrictions Precautions Precautions: Fall Precaution Comments: R AKA Restrictions Weight Bearing Restrictions: Yes RLE Weight Bearing: Non weight bearing LLE Weight Bearing: Weight bearing as tolerated       Mobility Bed Mobility Overal bed mobility: Needs Assistance Bed Mobility: Supine to Sit, Sit to Supine, Rolling Rolling: Min assist   Supine to sit: Mod assist, HOB elevated Sit  to supine: Supervision   General bed mobility comments: able to reach bedrail with BUE with hand over hand assist, assist to lift trunk with bedpad around torso. able to return to supine without assist, Min A to roll for bedpan placement - good initiation    Transfers Overall transfer level: Needs assistance                 General transfer comment: attempted scooting along bedside with pt difficulty clearing bottom, also limited by pain     Balance Overall balance assessment: Needs assistance Sitting-balance support: No upper extremity supported, Feet supported Sitting balance-Leahy Scale: Fair                                     ADL either performed or assessed with clinical judgement   ADL Overall ADL's : Needs assistance/impaired                     Lower Body Dressing: Maximal assistance;Sitting/lateral leans Lower Body Dressing Details (indicate cue type and reason): simulated LB dressing tasks with lateral lean practice with pt able to reach around to bottom with BUE fairly easily. reports uncomfortable attempting to bend towards sock due to pain   Toilet Transfer Details (indicate cue type and reason): assisted onto bedpan at end of session per pt request. assist to place urinal in appropriate position as well           General ADL Comments: Focus on EOB tasks and education on strategies for LB ADLs to decrease fall risk.    Extremity/Trunk Assessment Upper Extremity Assessment Upper Extremity Assessment: RUE deficits/detail RUE Deficits / Details: reports  several month hx of R shoulder decreased functional ROM, flexion to 60* actively. PROM WFL RUE Coordination: decreased gross motor   Lower Extremity Assessment Lower Extremity Assessment: Defer to PT evaluation        Vision   Vision Assessment?: No apparent visual deficits   Perception     Praxis      Cognition Arousal/Alertness: Awake/alert Behavior During Therapy: WFL  for tasks assessed/performed Overall Cognitive Status: Impaired/Different from baseline Area of Impairment: Following commands, Awareness, Problem solving, Safety/judgement                       Following Commands: Follows one step commands consistently, Follows one step commands with increased time, Follows multi-step commands with increased time Safety/Judgement: Decreased awareness of safety, Decreased awareness of deficits Awareness: Emergent Problem Solving: Slow processing, Difficulty sequencing, Requires verbal cues General Comments: Pt A&Ox4, but continues to display slow processing, needing repeated cues to sequence tasks. Some appears to likely be more due to him being [facility] rather than purely due to cog issues.        Exercises      Shoulder Instructions       General Comments educated pt on AROM of R limb while reducing shear force    Pertinent Vitals/ Pain       Pain Assessment Pain Assessment: Faces Faces Pain Scale: Hurts even more Pain Location: R residual limb Pain Descriptors / Indicators: Discomfort, Grimacing, Operative site guarding Pain Intervention(s): Monitored during session, Patient requesting pain meds-RN notified  Home Living                                          Prior Functioning/Environment              Frequency  Min 2X/week        Progress Toward Goals  OT Goals(current goals can now be found in the care plan section)  Progress towards OT goals: Progressing toward goals  Acute Rehab OT Goals Patient Stated Goal: for pain to improve OT Goal Formulation: With patient Time For Goal Achievement: 10/01/22 Potential to Achieve Goals: Good ADL Goals Pt Will Perform Grooming: with modified independence;sitting Pt Will Perform Lower Body Dressing: with supervision;with adaptive equipment;sitting/lateral leans Pt Will Transfer to Toilet: with min guard assist;anterior/posterior transfer;bedside commode;with  transfer board Pt Will Perform Toileting - Clothing Manipulation and hygiene: with min guard assist;sitting/lateral leans Additional ADL Goal #1: Pt will complete bed mobility with supervision,.  Plan Frequency remains appropriate;Discharge plan remains appropriate    Co-evaluation                 AM-PAC OT "6 Clicks" Daily Activity     Outcome Measure   Help from another person eating meals?: None Help from another person taking care of personal grooming?: A Little Help from another person toileting, which includes using toliet, bedpan, or urinal?: Total Help from another person bathing (including washing, rinsing, drying)?: A Lot Help from another person to put on and taking off regular upper body clothing?: A Little Help from another person to put on and taking off regular lower body clothing?: Total 6 Click Score: 14    End of Session    OT Visit Diagnosis: Other abnormalities of gait and mobility (R26.89);Muscle weakness (generalized) (M62.81);Pain Pain - Right/Left: Right Pain - part of body: Leg   Activity Tolerance Patient  tolerated treatment well;Patient limited by pain   Patient Left in bed;with call bell/phone within reach;with bed alarm set   Nurse Communication Mobility status;Patient requests pain meds;Other (comment) (notified NT that pt was on bedpan)        Time: 1207-1222 OT Time Calculation (min): 15 min  Charges: OT General Charges $OT Visit: 1 Visit OT Treatments $Self Care/Home Management : 8-22 mins  Malachy Chamber, OTR/L Acute Rehab Services Office: [phone]   Layla Maw 09/26/2022, 12:41 PM

## 2022-09-26 NOTE — Progress Notes (Addendum)
Contacted [facility] admissions this morning to request an update on clinic placement in [place]. Awaiting response.  [name] Renal Navigator [phone]  Addendum at 2:54 pm: Pt has been accepted to [facility] on TTS. Pt can start on Tuesday and will need to arrive at 11:30 to complete paperwork for 12:00 chair time. Spoke to pt's wife via phone to make her aware of above info. Wife plans to assist with paperwork at clinic on Tuesday. Update provided to nephrologist,renal PA, RN CM, and CSW. Will add arrangements to AVS as well. SNF will need to send hoyer pad/sling under pt to HD if pt to require hoyer from w/c to HD chair. This info provided to [facility] staff to provide to snf.

## 2022-09-26 NOTE — Progress Notes (Signed)
Physical Therapy Treatment Patient Details Name: Christian Dalton MRN: [id number] DOB: 1941-10-10 Today's Date: 09/26/2022   History of Present Illness Pt is a 81 y/o male presenting on 09/15/22 with worsening bilateral foot gangrene, s/p R AKA 10/24. PMH includes: ESRD on TTS HD, DM2, HTN, CAD, depression, COPD, PAD.    PT Comments    Pt is making good, gradual progress with mobility. His cognition also appears to have improved as he is A&Ox4, but demonstrates deficits in sequencing, problem-solving, and processing speed still. Focused session on transfer training by beginning with clearing his buttocks from the bed surface and progressing to pivoting buttocks to scoot along EOB. He was able to scoot along the EOB with modA and extra time today. He declined to get OOB to recliner due to pain though. Will continue to follow acutely. Current recommendations remain appropriate.      Recommendations for follow up therapy are one component of a multi-disciplinary discharge planning process, led by the attending physician.  Recommendations may be updated based on patient status, additional functional criteria and insurance authorization.  Follow Up Recommendations  Skilled nursing-short term rehab (<3 hours/day) Can patient physically be transported by private vehicle: No   Assistance Recommended at Discharge Frequent or constant Supervision/Assistance  Patient can return home with the following Two people to help with walking and/or transfers;A lot of help with bathing/dressing/bathroom;Assistance with cooking/housework;Assist for transportation;Help with stairs or ramp for entrance   Equipment Recommendations  Other (comment) (defer to next venue of care)    Recommendations for Other Services       Precautions / Restrictions Precautions Precautions: Fall Precaution Comments: R AKA Restrictions Weight Bearing Restrictions: Yes RLE Weight Bearing: Non weight bearing LLE Weight Bearing:  Weight bearing as tolerated     Mobility  Bed Mobility Overal bed mobility: Needs Assistance Bed Mobility: Rolling, Supine to Sit, Sit to Supine Rolling: Min assist   Supine to sit: HOB elevated, Mod assist Sit to supine: Min assist, HOB elevated   General bed mobility comments: MinA to complete trunk rotation to roll to R 2x, using bed rails to pull on. ModA to scoot hips towards EOB using bed pad for pt comfort when transition supine > sit L EOB, minA to ascend trunk while cuing pt to push up through his elbow/UE. MinA to lightly control trunk safely to midline of bed with return to supine.    Transfers Overall transfer level: Needs assistance Equipment used: None Transfers: Sit to/from Stand, Bed to chair/wheelchair/BSC Sit to Stand: Mod assist (mini buttocks lifts, not full stands)          Lateral/Scoot Transfers: Mod assist General transfer comment: Practiced x5 mini sit <> stands to clear buttocks from bed (not come to full stand) with bil hands on bed to push up, cuing for L knee extension. ModA to power up to partial stand/clearance of buttocks. ModA to direct hips with x1 lateral scoot to R and x6 lateral scoots to L, cuing for repositioning L foot each rep. Pt declined OOB to chair due to pain.    Ambulation/Gait               General Gait Details: deferred   Stairs             Wheelchair Mobility    Modified Rankin (Stroke Patients Only)       Balance Overall balance assessment: Needs assistance Sitting-balance support: Single extremity supported, No upper extremity supported, Bilateral upper extremity supported, Feet supported Sitting  balance-Leahy Scale: Fair Sitting balance - Comments: Able to sit statically EOB with supervision, prefers UE support due to pain though.                                    Cognition Arousal/Alertness: Awake/alert Behavior During Therapy: WFL for tasks assessed/performed Overall Cognitive  Status: Impaired/Different from baseline Area of Impairment: Following commands, Awareness, Problem solving, Safety/judgement                       Following Commands: Follows one step commands consistently, Follows one step commands with increased time, Follows multi-step commands with increased time Safety/Judgement: Decreased awareness of safety, Decreased awareness of deficits Awareness: Emergent Problem Solving: Slow processing, Difficulty sequencing, Requires verbal cues General Comments: Pt A&Ox4, but continues to display slow processing, needing repeated cues to sequence tasks. Some appears to likely be more due to him being [facility] rather than purely due to cog issues.        Exercises Amputee Exercises Straight Leg Raises: AROM, Right, 5 reps, Supine    General Comments General comments (skin integrity, edema, etc.): educated pt on AROM of R limb while reducing shear force      Pertinent Vitals/Pain Pain Assessment Pain Assessment: Faces Faces Pain Scale: Hurts even more Pain Location: R residual limb Pain Descriptors / Indicators: Discomfort, Grimacing, Operative site guarding Pain Intervention(s): Limited activity within patient's tolerance, Monitored during session, Repositioned, Patient requesting pain meds-RN notified    Home Living                          Prior Function            PT Goals (current goals can now be found in the care plan section) Acute Rehab PT Goals Patient Stated Goal: to reduce his pain PT Goal Formulation: With patient Time For Goal Achievement: 10/01/22 Potential to Achieve Goals: Fair Progress towards PT goals: Progressing toward goals    Frequency    Min 2X/week      PT Plan Current plan remains appropriate    Co-evaluation              AM-PAC PT "6 Clicks" Mobility   Outcome Measure  Help needed turning from your back to your side while in a flat bed without using bedrails?: A Little Help needed  moving from lying on your back to sitting on the side of a flat bed without using bedrails?: A Lot Help needed moving to and from a bed to a chair (including a wheelchair)?: A Lot Help needed standing up from a chair using your arms (e.g., wheelchair or bedside chair)?: Total Help needed to walk in hospital room?: Total Help needed climbing 3-5 steps with a railing? : Total 6 Click Score: 10    End of Session Equipment Utilized During Treatment: Gait belt Activity Tolerance: Patient tolerated treatment well Patient left: in bed;with call bell/phone within reach;with bed alarm set Nurse Communication: Mobility status;Patient requests pain meds PT Visit Diagnosis: Muscle weakness (generalized) (M62.81);Pain;Unsteadiness on feet (R26.81);Difficulty in walking, not elsewhere classified (R26.2) Pain - Right/Left: Right Pain - part of body:  (residual limb)     Time: 2440-1027 PT Time Calculation (min) (ACUTE ONLY): 18 min  Charges:  $Therapeutic Activity: 8-22 mins  Moishe Spice, PT, DPT Acute Rehabilitation Services  Office: [place] 09/26/2022, 10:57 AM

## 2022-09-26 NOTE — Care Management Important Message (Signed)
Important Message  Patient Details  Name: Christian Dalton MRN: [id number] Date of Birth: Sep 30, 1941   Medicare Important Message Given:  Yes     [name] 09/26/2022, 10:44 AM

## 2022-09-26 NOTE — Progress Notes (Signed)
[facility] Progress Note   Subjective:  Seen in room - feeling ok today. Denies CP or dyspnea.   Objective Vitals:   09/25/22 2334 09/26/22 0504 09/26/22 0805 09/26/22 1130  BP: (!) 109/52 104/62 126/68 109/61  Pulse: 96 100 98 94  Resp: '19 14 15 20  '$ Temp: 98.5 F (36.9 C) 98.3 F (36.8 C) 98.2 F (36.8 C) 98.1 F (36.7 C)  TempSrc: Oral Oral Oral   SpO2: 96% 96% 97% 97%  Weight:  82.1 kg    Height:       Physical Exam General: Chronically ill appearing man, NAD Heart: RRR; no murmur Lungs: CTAB; no rales Abdomen: soft Extremities: R AKA with intact staples, no dressing, L foot bandaged Dialysis Access: L AVF + thrill with chronic ulcer  Additional Objective Labs: Basic Metabolic Panel: Recent Labs  Lab 09/24/22 0208 09/25/22 0118 09/26/22 0108  NA 135 138 137  K 3.9 4.1 3.6  CL 96* 98 98  CO2 '28 28 29  '$ GLUCOSE 106* 105* 113*  BUN 38* 53* 42*  CREATININE 4.83* 5.67* 4.25*  CALCIUM 9.0 9.4 9.1  PHOS 3.1 3.6 2.7   Liver Function Tests: Recent Labs  Lab 09/24/22 0208 09/25/22 0118 09/26/22 0108  ALBUMIN 1.9* 1.9* 1.9*   CBC: Recent Labs  Lab 09/22/22 0113 09/23/22 0130 09/24/22 0208 09/25/22 0701 09/26/22 0108  WBC 15.0* 13.2* 12.1* 11.4* 10.9*  HGB 8.7* 8.5* 7.9* 7.9* 7.7*  HCT 27.7* 26.3* 24.6* 24.6* 25.3*  MCV 84.5 84.6 85.1 86.0 87.2  PLT 436* 437* 462* 435* 480*   Medications:  ferric gluconate (FERRLECIT) IVPB 105 mL/hr at 09/25/22 1556   magnesium sulfate bolus IVPB      (feeding supplement) PROSource Plus  30 mL Oral BID BM   aspirin EC  81 mg Oral Daily   atorvastatin  10 mg Oral Daily   buPROPion  150 mg Oral Daily   calcitRIOL  1.75 mcg Oral Q T,Th,Sa-HD   Chlorhexidine Gluconate Cloth  6 each Topical Q0600   darbepoetin (ARANESP) injection - DIALYSIS  150 mcg Subcutaneous Q Thu-1800   docusate sodium  100 mg Oral Daily   escitalopram  10 mg Oral QHS   heparin  5,000 Units Subcutaneous Q8H   HYDROmorphone  2  mg Oral BID   lactulose  10 g Oral BID   levothyroxine  125 mcg Oral Q0600   linaclotide  290 mcg Oral QPM   midodrine  10 mg Oral Once per day on Tue Thu Sat   montelukast  10 mg Oral QHS   sevelamer carbonate  2,400 mg Oral BID WC   traZODone  25 mg Oral QHS    Dialysis Orders: TTS - [facility]  3 hrs 45 min, BFR 425, DFR 800,  EDW 88.9 kg, 2 K/ 2 Ca LUE AVF  - No Heparin  - Mircera 50 mcg q2wks - last 10/19 - Calcitriol 1.75 mcg PO qHD - Venofer q week   Assessment/Plan: Gangrenous BLE wounds: S/p R AKA 10/24 by Dr. Donzetta Matters. No obvious stenosis to LLE- will ultimately need L amputation, timing unclear - looks like iwll be discharged to SNF/rehab and then come back for L amputation in the near future. Hyperkalemia -  on admit, resovled with HD. Chronic ulcer on AVF - ulcer healing but AVF is pulsatile. Will need F'gram as OP.  ESRD on HD TTS - continue usual HD schedule - next tomorrow. Hypertension/volume: BP stable, uses midodrine prn with HD. Anemia of  CKD: Hgb down to 7.7 - continue Aranesp weekly, transfuse prn. Secondary Hyperparathyroidism: CorrCa high, Phos ok. Continue binders - hold VDRA for now. Nutrition - Renal diet w/fluid restrictions - continue protein supps for wound healing. DM -per PMD Dispo -For SNF placement GOC: Elderly man with multiple co-morbid conditions, now s/p R AKA and will be undergoing L AKA in the near future. Remains full code.    [name], PA-C 09/26/2022, 2:16 PM  [employer]

## 2022-09-27 LAB — CBC
HCT: 24.8 % — ABNORMAL LOW (ref 39.0–52.0)
Hemoglobin: 7.8 g/dL — ABNORMAL LOW (ref 13.0–17.0)
MCH: 27 pg (ref 26.0–34.0)
MCHC: 31.5 g/dL (ref 30.0–36.0)
MCV: 85.8 fL (ref 80.0–100.0)
Platelets: 460 10*3/uL — ABNORMAL HIGH (ref 150–400)
RBC: 2.89 MIL/uL — ABNORMAL LOW (ref 4.22–5.81)
RDW: 19.6 % — ABNORMAL HIGH (ref 11.5–15.5)
WBC: 11.5 10*3/uL — ABNORMAL HIGH (ref 4.0–10.5)
nRBC: 0 % (ref 0.0–0.2)

## 2022-09-27 LAB — RENAL FUNCTION PANEL
Albumin: 2 g/dL — ABNORMAL LOW (ref 3.5–5.0)
Anion gap: 11 (ref 5–15)
BUN: 54 mg/dL — ABNORMAL HIGH (ref 8–23)
CO2: 27 mmol/L (ref 22–32)
Calcium: 9.4 mg/dL (ref 8.9–10.3)
Chloride: 98 mmol/L (ref 98–111)
Creatinine, Ser: 5.23 mg/dL — ABNORMAL HIGH (ref 0.61–1.24)
GFR, Estimated: 11 mL/min — ABNORMAL LOW (ref >60)
Glucose, Bld: 119 mg/dL — ABNORMAL HIGH (ref 70–99)
Phosphorus: 3.2 mg/dL (ref 2.5–4.6)
Potassium: 3.8 mmol/L (ref 3.5–5.1)
Sodium: 136 mmol/L (ref 135–145)

## 2022-09-27 LAB — GLUCOSE, CAPILLARY
Glucose-Capillary: 105 mg/dL — ABNORMAL HIGH (ref 70–99)
Glucose-Capillary: 71 mg/dL (ref 70–99)
Glucose-Capillary: 95 mg/dL (ref 70–99)
Glucose-Capillary: 96 mg/dL (ref 70–99)

## 2022-09-27 NOTE — Progress Notes (Signed)
[facility] Progress Note   Subjective:  Seen in room - a little confused about the days this morning, but denies CP or dyspnea. For HD later today.  Objective Vitals:   09/27/22 0015 09/27/22 0317 09/27/22 0600 09/27/22 0803  BP: 100/66 95/71  (!) 108/52  Pulse: 93 90  92  Resp: '20 20  20  '$ Temp: 98.1 F (36.7 C) 98 F (36.7 C)  98.2 F (36.8 C)  TempSrc: Oral Oral  Oral  SpO2: 95% 96%  96%  Weight:   81.8 kg   Height:       Physical Exam General: Chronically ill appearing man, NAD Heart: RRR; no murmur Lungs: CTAB; no rales Abdomen: soft Extremities: R AKA with intact staples, no dressing, L foot bandaged Dialysis Access: L AVF + thrill with chronic ulcer  Additional Objective Labs: Basic Metabolic Panel: Recent Labs  Lab 09/25/22 0118 09/26/22 0108 09/27/22 0131  NA 138 137 136  K 4.1 3.6 3.8  CL 98 98 98  CO2 '28 29 27  '$ GLUCOSE 105* 113* 119*  BUN 53* 42* 54*  CREATININE 5.67* 4.25* 5.23*  CALCIUM 9.4 9.1 9.4  PHOS 3.6 2.7 3.2   Liver Function Tests: Recent Labs  Lab 09/25/22 0118 09/26/22 0108 09/27/22 0131  ALBUMIN 1.9* 1.9* 2.0*   CBC: Recent Labs  Lab 09/23/22 0130 09/24/22 0208 09/25/22 0701 09/26/22 0108 09/27/22 0131  WBC 13.2* 12.1* 11.4* 10.9* 11.5*  HGB 8.5* 7.9* 7.9* 7.7* 7.8*  HCT 26.3* 24.6* 24.6* 25.3* 24.8*  MCV 84.6 85.1 86.0 87.2 85.8  PLT 437* 462* 435* 480* 460*   Medications:  ferric gluconate (FERRLECIT) IVPB 105 mL/hr at 09/25/22 1556   magnesium sulfate bolus IVPB      (feeding supplement) PROSource Plus  30 mL Oral BID BM   aspirin EC  81 mg Oral Daily   atorvastatin  10 mg Oral Daily   buPROPion  150 mg Oral Daily   Chlorhexidine Gluconate Cloth  6 each Topical Q0600   darbepoetin (ARANESP) injection - DIALYSIS  150 mcg Subcutaneous Q Thu-1800   docusate sodium  100 mg Oral Daily   escitalopram  10 mg Oral QHS   heparin  5,000 Units Subcutaneous Q8H   HYDROmorphone  2 mg Oral BID   lactulose   10 g Oral BID   levothyroxine  125 mcg Oral Q0600   linaclotide  290 mcg Oral QPM   midodrine  10 mg Oral Once per day on Tue Thu Sat   montelukast  10 mg Oral QHS   sevelamer carbonate  2,400 mg Oral BID WC   traZODone  25 mg Oral QHS    Dialysis Orders: TTS - [facility]  3 hrs 45 min, BFR 425, DFR 800,  EDW 88.9 kg, 2 K/ 2 Ca LUE AVF  - No Heparin  - Mircera 50 mcg q2wks - last 10/19 - Calcitriol 1.75 mcg PO qHD - Venofer q week    Assessment/Plan: Gangrenous BLE wounds: S/p R AKA 10/24 by Dr. Donzetta Matters. No obvious stenosis to LLE- will ultimately need L amputation, timing unclear - looks like iwll be discharged to SNF/rehab and then come back for L amputation in the near future. Hyperkalemia -  on admit, resovled with HD. Chronic ulcer on AVF - ulcer healing but AVF is pulsatile. Will need F'gram as OP.  ESRD on HD TTS - continue usual HD schedule - for HD later today. Hypertension/volume: BP stable, uses midodrine prn with HD. Anemia of  CKD: Hgb down to 7.8 - continue Aranesp weekly, transfuse prn. Secondary Hyperparathyroidism: CorrCa high, Phos ok. Continue binders - hold VDRA for now. Nutrition - Renal diet w/fluid restrictions - continue protein supps for wound healing. DM -per PMD Dispo -For SNF placement GOC: Elderly man with multiple co-morbid conditions, now s/p R AKA and will be undergoing L AKA in the near future. Remains full code.   [name], PA-C 09/27/2022, 10:54 AM  [employer]

## 2022-09-27 NOTE — TOC Progression Note (Signed)
Transition of Care [facility]) - Progression Note    Patient Details  Name: Christian Dalton MRN: [id number] Date of Birth: 1941/10/21  Transition of Care [facility]) CM/SW [place], [place] Phone Number: 09/27/2022, 1:41 PM  Clinical Narrative:     CSW notified by MD pt is medically stable to discharge today. Pt's authorization is still pending. CSW followed up with [facility] and was advised they were expecting pt Friday, but had not received authorization. They do not take HD pt's over the weekend, and stated they will be able to admit on Monday. Authorization is still pending at this time. CSW updated pt's spouse on barriers. TOC will continue to follow for DC needs.  Expected Discharge Plan: [place] Barriers to Discharge: SNF Pending bed offer, Continued Medical Work up  Expected Discharge Plan and Services Expected Discharge Plan: [place] In-house Referral: Clinical Social Work     Living arrangements for the past 2 months: Single Family Home                                       Social Determinants of Health (SDOH) Interventions    Readmission Risk Interventions     No data to display

## 2022-09-27 NOTE — Progress Notes (Signed)
Received patient in bed to unit. Alert and oriented to self Informed consent signed and in chart. 09/15/22   Treatment initiated: 1511 Treatment completed: 1737   Patient came to the unit and prior to starting the procedure, patient already stated that he wanted to stay only for 2 hours, tried to encourage patient about staying a longer but insisted of going home. Dr. Jonnie Finner informed about the situation at 1600. Patient singed an early termination of treatment against medical advice.  Transported back to the room Alert, without acute distress. Hand-off given to patient's nurse.   Access used:  Left AVF  Access issues: none  Dressing: gauze and paper tape.  Total UF removed: 1.5L Medication(s) given: none  Post HD VS: see table below  Post HD weight: 82.1 KG   09/27/22 1730  Vitals  BP 110/62  MAP (mmHg) 76  BP Location Right Arm  BP Method Automatic  Patient Position (if appropriate) Lying  Pulse Rate 89  Pulse Rate Source Monitor  ECG Heart Rate 88  Resp (!) 26  Oxygen Therapy  SpO2 99 %  O2 Device Room Air  Patient Activity (if Appropriate) In bed  Pulse Oximetry Type Continuous  During Treatment Monitoring  Intra-Hemodialysis Comments Tx completed   Riccardo Dubin RN hemodialysis

## 2022-09-27 NOTE — Progress Notes (Signed)
HD#12 SUBJECTIVE:  Patient Summary: Christian Dalton is a 81 y.o. who is s/p R AKA with some post-operative delirium that has since resolved, on TTSa HD, medically stable for discharge awaiting definitive plan for SNF.  Overnight Events: None  Interim History: [name] states that he feels well this morning. He does not complain of pain of the R AKA stump nor his necrotic L great and second toes.  OBJECTIVE:  Vital Signs: Vitals:   09/27/22 0015 09/27/22 0317 09/27/22 0600 09/27/22 0803  BP: 100/66 95/71  (!) 108/52  Pulse: 93 90  92  Resp: '20 20  20  '$ Temp: 98.1 F (36.7 C) 98 F (36.7 C)  98.2 F (36.8 C)  TempSrc: Oral Oral  Oral  SpO2: 95% 96%  96%  Weight:   81.8 kg   Height:       Supplemental O2: Room Air SpO2: 96 % O2 Flow Rate (L/min): 2 L/min  Filed Weights   09/25/22 1245 09/26/22 0504 09/27/22 0600  Weight: 82.5 kg 82.1 kg 81.8 kg     Intake/Output Summary (Last 24 hours) at 09/27/2022 1109 Last data filed at 09/27/2022 0981 Gross per 24 hour  Intake 490 ml  Output --  Net 490 ml   Net IO Since Admission: -2,218.5 mL [09/27/22 1109]  Physical Exam: Constitutional:Appears stated age. In no acute distress. Pulm:Normal work of breathing on room air. MSK:RLE stump without increased warmth, erythema, edema, drainage. L great and second toes with necrotic skin changes, no drainage. L DP pulse 1+. Neuro:Alert and oriented x3. No focal deficit noted. Psych:Pleasant mood and affect.   Patient Lines/Drains/Airways Status     Active Line/Drains/Airways     Name Placement date Placement time Site Days   Peripheral IV 09/15/22 20 G 1.88" Posterior;Right Forearm 09/15/22  1520  Forearm  12   Peripheral IV 09/15/22 20 G Anterior;Right Wrist 09/15/22  2017  Wrist  12   Fistula / Graft Left Upper arm Arteriovenous fistula 08/17/18  1907  Upper arm  1502   Fistula / Graft Left Upper arm Arteriovenous fistula 12/02/19  0957  Upper arm  1030   GI Stent 7 Fr. 11/25/18   1406  --  1402   Incision (Closed) 09/16/22 Leg Right 09/16/22  1436  -- 11   Incision - 4 Ports Abdomen Umbilicus Medial;Upper Right;Lateral Right;Medial 11/14/18  1018  -- 1413   Wound / Incision (Open or Dehisced) 05/30/22 Puncture Groin Right 05/30/22  1530  Groin  120   Wound / Incision (Open or Dehisced) 05/30/22 Skin tear Pretibial Left 05/30/22  1600  Pretibial  120   Wound / Incision (Open or Dehisced) 05/30/22 Knee Anterior;Left 05/30/22  1600  Knee  120   Wound / Incision (Open or Dehisced) 05/30/22 Non-pressure wound Toe (Comment  which one) Anterior;Left;Lateral black gangrene on 1st and 2nd left toes 05/30/22  1600  Toe (Comment  which one)  120   Wound / Incision (Open or Dehisced) 05/30/22 Skin tear Arm Left;Lower;Posterior 05/30/22  1600  Arm  120   Wound / Incision (Open or Dehisced) 05/30/22 Non-pressure wound Foot Anterior;Right;Lateral 05/30/22  1600  Foot  120   Wound / Incision (Open or Dehisced) 05/30/22 Foot Anterior;Right;Lateral 05/30/22  1600  Foot  120   Wound / Incision (Open or Dehisced) 05/30/22 Toe (Comment  which one) Anterior;Right;Lateral 5th digit 05/30/22  1600  Toe (Comment  which one)  120   Wound / Incision (Open or Dehisced) 06/02/22 Skin  tear Foot Anterior;Right 06/02/22  0430  Foot  117             ASSESSMENT/PLAN:  Assessment: Principal Problem:   Gangrene of both feet (HCC) Active Problems:   ESRD (end stage renal disease) on dialysis (HCC)   Type II diabetes mellitus with renal manifestations (HCC)   HTN (hypertension)   HLD (hyperlipidemia)   COPD (chronic obstructive pulmonary disease) (HCC)   HFrEF (heart failure with reduced ejection fraction) ([place])   Plan: #Bilateral foot ischemic necrosis  Pain is well controlled at this time. [name] is medically stable for discharge and has been placed at [facility] with TTSa outpatient HD arranged as well, currently pending insurance authorization.  -Continue dilaudid to 2 mg BID   -Continue ASA '81mg'$   -Continue atorvastatin 80 mg daily     #Hospital associated delirium  Patient has responded well to nightly trazodone therapy. He does not seem delirious on exam today, likely that this has resolved. -Delirium precautions -Trazodone 25 mg daily at bedtime   #T2DM No hypoglycemic episodes in the last 24h.  - CBG checks q4h   #Hypotension #ESRD on hemodialysis  BP stable, 108/52. Scheduled to receive HD at some point today. -Midodrine 10 mg with HD -Renvela 2,400 mg BID with meals    #COPD Stable. No signs of exacerbation. - Continue singulair 10 mg daily at bedtime, albuterol nebs q6h PRN   #Constipation  -Lactulose 10 g BID -Linzess 290 mcg daily -Colace 100 mg daily  #Hypothyroidism -Continue home levothyroxine 125 mcg daily  #Depression -Continue home Wellbutrin 150 mg daily, Lexapro 10 mg daily  Best Practice: Diet: Renal diet, fluid restriction of 1.2L IVF: None VTE: heparin injection 5,000 Units Start: 09/16/22 2200 SCD's Start: 09/16/22 1550 SCDs Start: 09/15/22 1714 Code: Full AB: None Therapy Recs: SNF Family Contact: Yolanda, called and notified. DISPO: Anticipated discharge to Skilled nursing facility pending  insurance authorization .  Signature: Farrel Gordon, D.O.  Internal Medicine Resident, PGY-2 [facility]  Pager: [phone] 11:09 AM, 09/27/2022   Please contact the on call pager after 5 pm and on weekends at [phone].

## 2022-09-28 DIAGNOSIS — R410 Disorientation, unspecified: Secondary | ICD-10-CM | POA: Diagnosis not present

## 2022-09-28 DIAGNOSIS — I96 Gangrene, not elsewhere classified: Secondary | ICD-10-CM | POA: Diagnosis not present

## 2022-09-28 LAB — GLUCOSE, CAPILLARY
Glucose-Capillary: 82 mg/dL (ref 70–99)
Glucose-Capillary: 99 mg/dL (ref 70–99)
Glucose-Capillary: 86 mg/dL (ref 70–99)
Glucose-Capillary: 98 mg/dL (ref 70–99)

## 2022-09-28 LAB — RENAL FUNCTION PANEL
Albumin: 2 g/dL — ABNORMAL LOW (ref 3.5–5.0)
Anion gap: 11 (ref 5–15)
BUN: 38 mg/dL — ABNORMAL HIGH (ref 8–23)
CO2: 28 mmol/L (ref 22–32)
Calcium: 9 mg/dL (ref 8.9–10.3)
Chloride: 94 mmol/L — ABNORMAL LOW (ref 98–111)
Creatinine, Ser: 4.13 mg/dL — ABNORMAL HIGH (ref 0.61–1.24)
GFR, Estimated: 14 mL/min — ABNORMAL LOW (ref >60)
Glucose, Bld: 88 mg/dL (ref 70–99)
Phosphorus: 2.8 mg/dL (ref 2.5–4.6)
Potassium: 3.6 mmol/L (ref 3.5–5.1)
Sodium: 133 mmol/L — ABNORMAL LOW (ref 135–145)

## 2022-09-28 NOTE — Progress Notes (Signed)
[facility] Progress Note   Subjective:  Seen in room - no CP/dyspnea. Dialyzed yesterday - he signed off HD early with only 1.5L removed.   Objective Vitals:   09/27/22 1856 09/27/22 2004 09/27/22 2352 09/28/22 0351  BP: 121/62 104/68 (!) 102/59 103/62  Pulse: 98 (!) 101 99 95  Resp: '18 19 20 20  '$ Temp: 98.2 F (36.8 C) 98.4 F (36.9 C) 98.7 F (37.1 C) 98.3 F (36.8 C)  TempSrc: Oral Oral Oral Oral  SpO2: 98% 96% 90% 97%  Weight:      Height:       Physical Exam General: Chronically ill appearing man, NAD Heart: RRR; no murmur Lungs: CTAB; no rales Abdomen: soft Extremities: R AKA with intact staples, L foot bandaged, 1+ BUE dependent edema in elbows Dialysis Access: L AVF + thrill with chronic ulcer   Additional Objective Labs: Basic Metabolic Panel: Recent Labs  Lab 09/26/22 0108 09/27/22 0131 09/28/22 0122  NA 137 136 133*  K 3.6 3.8 3.6  CL 98 98 94*  CO2 '29 27 28  '$ GLUCOSE 113* 119* 88  BUN 42* 54* 38*  CREATININE 4.25* 5.23* 4.13*  CALCIUM 9.1 9.4 9.0  PHOS 2.7 3.2 2.8   Liver Function Tests: Recent Labs  Lab 09/26/22 0108 09/27/22 0131 09/28/22 0122  ALBUMIN 1.9* 2.0* 2.0*   CBC: Recent Labs  Lab 09/23/22 0130 09/24/22 0208 09/25/22 0701 09/26/22 0108 09/27/22 0131  WBC 13.2* 12.1* 11.4* 10.9* 11.5*  HGB 8.5* 7.9* 7.9* 7.7* 7.8*  HCT 26.3* 24.6* 24.6* 25.3* 24.8*  MCV 84.6 85.1 86.0 87.2 85.8  PLT 437* 462* 435* 480* 460*   Medications:  ferric gluconate (FERRLECIT) IVPB 105 mL/hr at 09/25/22 1556   magnesium sulfate bolus IVPB      (feeding supplement) PROSource Plus  30 mL Oral BID BM   aspirin EC  81 mg Oral Daily   atorvastatin  10 mg Oral Daily   buPROPion  150 mg Oral Daily   Chlorhexidine Gluconate Cloth  6 each Topical Q0600   darbepoetin (ARANESP) injection - DIALYSIS  150 mcg Subcutaneous Q Thu-1800   docusate sodium  100 mg Oral Daily   escitalopram  10 mg Oral QHS   heparin  5,000 Units Subcutaneous  Q8H   HYDROmorphone  2 mg Oral BID   lactulose  10 g Oral BID   levothyroxine  125 mcg Oral Q0600   linaclotide  290 mcg Oral QPM   midodrine  10 mg Oral Once per day on Tue Thu Sat   montelukast  10 mg Oral QHS   sevelamer carbonate  2,400 mg Oral BID WC   traZODone  25 mg Oral QHS    Dialysis Orders: TTS - [facility]  3 hrs 45 min, BFR 425, DFR 800,  EDW 88.9 kg, 2 K/ 2 Ca LUE AVF  - No Heparin  - Mircera 50 mcg q2wks - last 10/19 - Calcitriol 1.75 mcg PO qHD - Venofer q week    Assessment/Plan: Gangrenous BLE wounds: S/p R AKA 10/24 by Dr. Donzetta Matters. No obvious stenosis to LLE- will ultimately need L amputation, timing unclear - looks like will be discharged to SNF/rehab and then come back for L amputation in the near future. Hyperkalemia -  on admit, resovled with HD. Chronic ulcer on AVF - ulcer healing but AVF is pulsatile. Will need F'gram as OP.  ESRD on HD TTS - continue usual HD schedule - next HD 11/7. Hypertension/volume: BP low/stable, uses midodrine  with HD. + edema, below prior EDW - will change on d/c. Anemia of CKD: Hgb down to 7.8 - continue Aranesp weekly, transfuse prn. Secondary Hyperparathyroidism: CorrCa high, Phos ok. Continue binders - hold VDRA for now. Nutrition - Renal diet w/fluid restrictions - continue protein supps for wound healing. DM -per PMD Dispo -For SNF placement GOC: Elderly man with multiple co-morbid conditions, now s/p R AKA and will be undergoing L AKA in the near future. Remains full code.   [name], PA-C 09/28/2022, 9:13 AM  [employer]

## 2022-09-28 NOTE — Progress Notes (Signed)
Subjective:  The patient reports he feels fine today. His RLE pain is well controlled. Denies chest pain. He has not had an appetite and per his wife, and he did not eat dinner last night or breakfast this morning. He also endorsed some mild shortness of breath this morning and requested his nurse put him on nasal cannula.   Objective:  Vital signs in last 24 hours: Vitals:   09/27/22 2004 09/27/22 2352 09/28/22 0351 09/28/22 0925  BP: 104/68 (!) 102/59 103/62 112/64  Pulse: (!) 101 99 95 100  Resp: '19 20 20 18  '$ Temp: 98.4 F (36.9 C) 98.7 F (37.1 C) 98.3 F (36.8 C) 98.3 F (36.8 C)  TempSrc: Oral Oral Oral Oral  SpO2: 96% 90% 97% 93%  Weight:      Height:       Weight change: 0.3 kg  Intake/Output Summary (Last 24 hours) at 09/28/2022 1045 Last data filed at 09/27/2022 1800 Gross per 24 hour  Intake --  Output 1500 ml  Net -1500 ml   General: Sleepy; Appears to be resting comfortably in bed; No acute distress Cardiovascular: Regular rate and rhythm; No murmurs, rubs or gallops; 2+ radial pulses Pulmonary: Normal work of breathing, no respiratory distress; Lungs clear to auscultation bilaterally; No wheezes Extremities: - RLE:  Dried blood to central R AKA wound; clean; no swelling, erythema, drainage, or active bleeding.   - LLE:  Warm; Stable dry gangrene to toes 1 and 2 with no erythema or drainage.  Neuro: Alert and oriented x1; oriented to person, not oriented to place, time, or situation; No focal deficit noted.  Psych: Pleasant mood and affect; cooperative; Able to answer questions appropriately   Assessment/Plan:  Principal Problem:   Gangrene of both feet ([place]) Active Problems:   ESRD (end stage renal disease) on dialysis ([place])   Type II diabetes mellitus with renal manifestations (HCC)   HTN (hypertension)   HLD (hyperlipidemia)   COPD (chronic obstructive pulmonary disease) (HCC)   HFrEF (heart failure with reduced ejection fraction) (HCC)   Christian Dalton is a 81 y.o. male with past medical history of ESRD on T/Th/S HD, T2DM, HTN, CAD, HLD, hypothyroidism, depression, COPD, HFrEF, and peripheral artery disease presenting with worsening bilateral foot ischemic necrosis, status post right AKA performed Tuesday, 10/24.     #Bilateral foot ischemic necrosis  Hospital day 13. The patient reports well controlled RLE pain. He has accepted a bed at [facility]. Anticipate transfer tomorrow, 11/6, pending insurance authorization. Will decrease dilaudid from BID to PRN to avoid worsening hospital-associated delirium.  - Dilaudid 2 mg PRN - Aspirin '81mg'$  and atorvastatin '80mg'$  daily  - Dispo to [place] for rehabilitation, awaiting insurance authorization   #Hospital associated delirium  The patient appeared a little sleepier today. He was A&O x1; he could not remember where he was, the date, or his R AKA procedure. However, he had a pleasant mood and affect and was cooperative. He did receive his scheduled dilaudid 2 mg one hour prior to our assessment, so unclear whether this is worsening hospital-associated delirium or medication side effect. His pain has been well controlled on dilaudid 2 mg BID, so plan to change dilaudid from BID to PRN to avoid worsening delirium. Will also continue Trazodone nightly.  - Delirium precautions - Trazodone '25mg'$  nightly  #COPD He endorses some mild shortness of breath today and requests nasal cannula; however, on exam he had normal work of breathing and no lung findings. SpO2 is  93% on room air. He has requested nasal cannula intermittently during his admission for comfort with stable O2 saturations. Will continue to monitor.  - Continue singulair '10mg'$  QHS, albuterol nebs q6h prn   #T2DM # hypoglycemia  Fasting blood glucose today is 82. He has not required any further D50 in the past 24 hours. Still tolerating PO well.  - D50 prn for hypoglycemia  - CBG checks every 4 hours   #Hypotension #ESRD on hemodialysis   The patient's blood pressure today is 112/64. Electrolytes today are WNL, except Na slightly decreased to 133. Patient is back on T/Th/Sat schedule and will be dialyzed Tuesday, 11/7.  Will continue to monitor BMP.  - Renal diet  - Midodrine 10 TID with meals  - Renvela 800 BID with meals      #Constipation  The patient had a bowel movement four days ago. - Lactulose 10 g daily for constipation     LOS: 13 days   Paulo Fruit, Medical Student 09/28/2022, 10:45 AM

## 2022-09-29 LAB — RENAL FUNCTION PANEL
Albumin: 2.1 g/dL — ABNORMAL LOW (ref 3.5–5.0)
Anion gap: 13 (ref 5–15)
BUN: 48 mg/dL — ABNORMAL HIGH (ref 8–23)
CO2: 26 mmol/L (ref 22–32)
Calcium: 9.3 mg/dL (ref 8.9–10.3)
Chloride: 97 mmol/L — ABNORMAL LOW (ref 98–111)
Creatinine, Ser: 5.13 mg/dL — ABNORMAL HIGH (ref 0.61–1.24)
GFR, Estimated: 11 mL/min — ABNORMAL LOW (ref >60)
Glucose, Bld: 83 mg/dL (ref 70–99)
Phosphorus: 3.3 mg/dL (ref 2.5–4.6)
Potassium: 3.8 mmol/L (ref 3.5–5.1)
Sodium: 136 mmol/L (ref 135–145)

## 2022-09-29 LAB — GLUCOSE, CAPILLARY: Glucose-Capillary: 82 mg/dL (ref 70–99)

## 2022-09-29 MED ORDER — ACETAMINOPHEN 325 MG PO TABS: 650.0000 mg | ORAL_TABLET | Freq: Four times a day (QID) | ORAL | Status: AC | PRN

## 2022-09-29 MED ORDER — HYDROMORPHONE HCL 2 MG PO TABS: 2.0000 mg | ORAL_TABLET | Freq: Two times a day (BID) | ORAL | Status: DC | PRN

## 2022-09-29 MED ORDER — HYDROMORPHONE HCL 2 MG PO TABS: 2.0000 mg | ORAL_TABLET | Freq: Three times a day (TID) | ORAL | 0 refills | Status: AC | PRN

## 2022-09-29 MED ORDER — MIDODRINE HCL 10 MG PO TABS: ORAL_TABLET | ORAL | 0 refills | Status: AC

## 2022-09-29 NOTE — Care Management Important Message (Signed)
Important Message  Patient Details  Name: Christian Dalton MRN: [id number] Date of Birth: 1941/04/22   Medicare Important Message Given:  Yes     [name] 09/29/2022, 12:15 PM

## 2022-09-29 NOTE — Progress Notes (Signed)
Patient ID: Christian Dalton, male   DOB: [birth year], 81 y.o.   MRN: [id number] [facility] Progress Note   Assessment/ Plan:   1.  Status post bilateral lower extremity gangrenous wounds: Underwent right above-knee amputation on 10/24 by vascular surgery and appears that he will ultimately need left lower extremity (possibly AK) amputation.  Awaiting admission to skilled nursing facility. 2. ESRD: On hemodialysis on a TTS schedule with next hemodialysis ordered for tomorrow.  He does not have any acute indication clinically for dialysis today. 3. Anemia: Low hemoglobin/hematocrit noted and without indication for PRBC transfusion at this time.  Suspect significant ESA resistance in the setting of gangrenous lower extremity wounds and status post recent surgery with attendant losses.  Continue high-dose Aranesp. 4. CKD-MBD: Calcium and phosphorus level within acceptable range, monitor on sevelamer. 5. Nutrition: Continue renal diet with oral nutritional supplementation.  Suspect hypoalbuminemia is in part from lower extremity wounds and recent surgery/acute phase reaction. 6. Hypotension: Low blood pressures noted without overt markers of sepsis.  Continue midodrine.  Subjective:   Reports to be feeling fairly with decent pain control over surgical site.  Inquires about disposition.   Objective:   BP (!) 105/59 (BP Location: Right Arm)   Pulse 89   Temp 97.8 F (36.6 C) (Oral)   Resp 16   Ht '5\' 10"'$  (1.778 m)   Wt 82.1 kg Comment: BED SCALE  SpO2 100%   BMI 25.97 kg/m   Physical Exam: Gen: Comfortably resting in bed, on oxygen via nasal cannula CVS: Pulse regular rhythm, normal rate.  Heart sounds S1 and S2 normal Resp: Clear to auscultation bilaterally without rales/rhonchi Abd: Soft, flat, nontender, bowel sounds normal Ext: Status post right above-knee amputation with intact staples.  Left lower extremity without edema.  Intact dressings over left upper arm AV  fistula.  Labs: BMET Recent Labs  Lab 09/23/22 0130 09/24/22 0208 09/25/22 0118 09/26/22 0108 09/27/22 0131 09/28/22 0122 09/29/22 0051  NA 134* 135 138 137 136 133* 136  K 4.6 3.9 4.1 3.6 3.8 3.6 3.8  CL 96* 96* 98 98 98 94* 97*  CO2 '25 28 28 29 27 28 26  '$ GLUCOSE 102* 106* 105* 113* 119* 88 83  BUN 54* 38* 53* 42* 54* 38* 48*  CREATININE 6.34* 4.83* 5.67* 4.25* 5.23* 4.13* 5.13*  CALCIUM 8.9 9.0 9.4 9.1 9.4 9.0 9.3  PHOS 4.4 3.1 3.6 2.7 3.2 2.8 3.3   CBC Recent Labs  Lab 09/24/22 0208 09/25/22 0701 09/26/22 0108 09/27/22 0131  WBC 12.1* 11.4* 10.9* 11.5*  HGB 7.9* 7.9* 7.7* 7.8*  HCT 24.6* 24.6* 25.3* 24.8*  MCV 85.1 86.0 87.2 85.8  PLT 462* 435* 480* 460*    Medications:     (feeding supplement) PROSource Plus  30 mL Oral BID BM   aspirin EC  81 mg Oral Daily   atorvastatin  10 mg Oral Daily   buPROPion  150 mg Oral Daily   Chlorhexidine Gluconate Cloth  6 each Topical Q0600   darbepoetin (ARANESP) injection - DIALYSIS  150 mcg Subcutaneous Q Thu-1800   docusate sodium  100 mg Oral Daily   escitalopram  10 mg Oral QHS   heparin  5,000 Units Subcutaneous Q8H   lactulose  10 g Oral BID   levothyroxine  125 mcg Oral Q0600   linaclotide  290 mcg Oral QPM   midodrine  10 mg Oral Once per day on Tue Thu Sat   montelukast  10 mg Oral QHS  sevelamer carbonate  2,400 mg Oral BID WC   traZODone  25 mg Oral QHS   Elmarie Shiley, MD 09/29/2022, 9:15 AM

## 2022-09-29 NOTE — Plan of Care (Signed)
  Problem: Education: Goal: Knowledge of the prescribed therapeutic regimen will improve Outcome: Adequate for Discharge Goal: Ability to verbalize activity precautions or restrictions will improve Outcome: Adequate for Discharge Goal: Understanding of discharge needs will improve Outcome: Adequate for Discharge   Problem: Activity: Goal: Ability to perform//tolerate increased activity and mobilize with assistive devices will improve Outcome: Adequate for Discharge   Problem: Clinical Measurements: Goal: Postoperative complications will be avoided or minimized Outcome: Adequate for Discharge   Problem: Self-Care: Goal: Ability to meet self-care needs will improve Outcome: Adequate for Discharge   Problem: Self-Concept: Goal: Ability to maintain and perform role responsibilities to the fullest extent possible will improve Outcome: Adequate for Discharge   Problem: Pain Management: Goal: Pain level will decrease with appropriate interventions Outcome: Adequate for Discharge   Problem: Skin Integrity: Goal: Demonstration of wound healing without infection will improve Outcome: Adequate for Discharge   Problem: Education: Goal: Knowledge of General Education information will improve Description: Including pain rating scale, medication(s)/side effects and non-pharmacologic comfort measures Outcome: Adequate for Discharge   Problem: Health Behavior/Discharge Planning: Goal: Ability to manage health-related needs will improve Outcome: Adequate for Discharge   Problem: Clinical Measurements: Goal: Ability to maintain clinical measurements within normal limits will improve Outcome: Adequate for Discharge Goal: Will remain free from infection Outcome: Adequate for Discharge Goal: Diagnostic test results will improve Outcome: Adequate for Discharge Goal: Respiratory complications will improve Outcome: Adequate for Discharge Goal: Cardiovascular complication will be  avoided Outcome: Adequate for Discharge   Problem: Activity: Goal: Risk for activity intolerance will decrease Outcome: Adequate for Discharge   Problem: Nutrition: Goal: Adequate nutrition will be maintained Outcome: Adequate for Discharge   Problem: Coping: Goal: Level of anxiety will decrease Outcome: Adequate for Discharge   Problem: Elimination: Goal: Will not experience complications related to bowel motility Outcome: Adequate for Discharge Goal: Will not experience complications related to urinary retention Outcome: Adequate for Discharge   Problem: Pain Managment: Goal: General experience of comfort will improve Outcome: Adequate for Discharge   Problem: Safety: Goal: Ability to remain free from injury will improve Outcome: Adequate for Discharge   Problem: Skin Integrity: Goal: Risk for impaired skin integrity will decrease Outcome: Adequate for Discharge   Problem: Education: Goal: Ability to describe self-care measures that may prevent or decrease complications (Diabetes Survival Skills Education) will improve Outcome: Adequate for Discharge Goal: Individualized Educational Video(s) Outcome: Adequate for Discharge   Problem: Coping: Goal: Ability to adjust to condition or change in health will improve Outcome: Adequate for Discharge   Problem: Fluid Volume: Goal: Ability to maintain a balanced intake and output will improve Outcome: Adequate for Discharge   Problem: Health Behavior/Discharge Planning: Goal: Ability to identify and utilize available resources and services will improve Outcome: Adequate for Discharge Goal: Ability to manage health-related needs will improve Outcome: Adequate for Discharge   Problem: Metabolic: Goal: Ability to maintain appropriate glucose levels will improve Outcome: Adequate for Discharge   Problem: Nutritional: Goal: Maintenance of adequate nutrition will improve Outcome: Adequate for Discharge Goal: Progress  toward achieving an optimal weight will improve Outcome: Adequate for Discharge   Problem: Skin Integrity: Goal: Risk for impaired skin integrity will decrease Outcome: Adequate for Discharge   Problem: Tissue Perfusion: Goal: Adequacy of tissue perfusion will improve Outcome: Adequate for Discharge

## 2022-09-29 NOTE — TOC Progression Note (Signed)
Transition of Care [facility]) - Progression Note    Patient Details  Name: Christian Dalton MRN: [id number] Date of Birth: 12-10-1940  Transition of Care [facility]) CM/SW [place], [place] Phone Number: 09/29/2022, 10:18 AM  Clinical Narrative:     Received insurance authorization [id number] 11/5-11/8   Patient is ready for d/c to Marland Mcalpine, MSW, LCSW Clinical Social Worker    Expected Discharge Plan: [place] Barriers to Discharge: SNF Pending bed offer, Continued Medical Work up  Expected Discharge Plan and Services Expected Discharge Plan: [place] In-house Referral: Clinical Social Work     Living arrangements for the past 2 months: Single Family Home                                       Social Determinants of Health (SDOH) Interventions    Readmission Risk Interventions     No data to display

## 2022-09-29 NOTE — TOC Transition Note (Signed)
Transition of Care [facility]) - CM/SW Discharge Note   Patient Details  Name: Mickal Meno MRN: [id number] Date of Birth: November 24, 1941  Transition of Care [facility]) CM/SW Contact:  Vinie Sill, LCSW Phone Number: 09/29/2022, 10:59 AM   Clinical Narrative:     Patient will Discharge to: [facility] Discharge Date: 09/29/2022 Family Notified: spouse  Transport By: Corey Harold  Per MD patient is ready for discharge. RN, patient, and facility notified of discharge. Discharge Summary sent to facility. RN given number for [phone]. Ambulance transport requested for patient.   Clinical Social Worker signing off.  Thurmond Butts, MSW, LCSW Clinical Social Worker     Final next level of care: Skilled Nursing Facility Barriers to Discharge: Barriers Resolved   Patient Goals and CMS Choice        Discharge Placement              Patient chooses bed at: [facility] Patient to be transferred to facility by: [place] Name of family member notified: spouse Patient and family notified of of transfer: 09/29/22  Discharge Plan and Services In-house Referral: Clinical Social Work                                   Social Determinants of Health (SDOH) Interventions     Readmission Risk Interventions     No data to display

## 2022-09-29 NOTE — Progress Notes (Addendum)
Pt d/c'd from 4E to [facility] via [facility]. D/c instructions and medication information given to transport for facility. Tele and IV's removed. Pt wife at bedside w/plans to meet pt at facility. Report called to nurse [facility].  Raelyn Number, RN

## 2022-09-29 NOTE — Progress Notes (Signed)
Pt reporting shob and feeling like he is "smothering". O2 sats >93% on RA. MD aware and responded to bedside.   Raelyn Number, RN

## 2022-09-29 NOTE — Progress Notes (Signed)
Pt to d/c to [facility] today. Contacted [place] and spoke to [facility] aware pt will start tomorrow as planned. Contacted renal PA to request that orders be sent to [facility] for pt's treatment tomorrow. Spoke to pt's wife on Friday regarding HD clinic details at d/c.   [name] Renal Navigator [phone]

## 2022-10-13 ENCOUNTER — Emergency Department (HOSPITAL_COMMUNITY): Payer: Medicare Other

## 2022-10-13 ENCOUNTER — Emergency Department (HOSPITAL_COMMUNITY)
Admission: EM | Admit: 2022-10-13 | Discharge: 2022-10-13 | Disposition: A | Discharge status: Home / Self Care | Payer: Medicare Other | Attending: Emergency Medicine | Admitting: Emergency Medicine

## 2022-10-13 DIAGNOSIS — Z992 Dependence on renal dialysis: Secondary | ICD-10-CM | POA: Insufficient documentation

## 2022-10-13 DIAGNOSIS — I6782 Cerebral ischemia: Secondary | ICD-10-CM | POA: Insufficient documentation

## 2022-10-13 DIAGNOSIS — Z79899 Other long term (current) drug therapy: Secondary | ICD-10-CM | POA: Diagnosis not present

## 2022-10-13 DIAGNOSIS — N186 End stage renal disease: Secondary | ICD-10-CM | POA: Diagnosis not present

## 2022-10-13 DIAGNOSIS — E039 Hypothyroidism, unspecified: Secondary | ICD-10-CM | POA: Insufficient documentation

## 2022-10-13 DIAGNOSIS — S99922A Unspecified injury of left foot, initial encounter: Secondary | ICD-10-CM | POA: Diagnosis present

## 2022-10-13 DIAGNOSIS — J449 Chronic obstructive pulmonary disease, unspecified: Secondary | ICD-10-CM | POA: Diagnosis not present

## 2022-10-13 DIAGNOSIS — I120 Hypertensive chronic kidney disease with stage 5 chronic kidney disease or end stage renal disease: Secondary | ICD-10-CM | POA: Diagnosis not present

## 2022-10-13 DIAGNOSIS — I2510 Atherosclerotic heart disease of native coronary artery without angina pectoris: Secondary | ICD-10-CM | POA: Insufficient documentation

## 2022-10-13 DIAGNOSIS — S50819A Abrasion of unspecified forearm, initial encounter: Secondary | ICD-10-CM | POA: Insufficient documentation

## 2022-10-13 DIAGNOSIS — E875 Hyperkalemia: Secondary | ICD-10-CM | POA: Diagnosis not present

## 2022-10-13 DIAGNOSIS — Z7982 Long term (current) use of aspirin: Secondary | ICD-10-CM | POA: Diagnosis not present

## 2022-10-13 DIAGNOSIS — E1122 Type 2 diabetes mellitus with diabetic chronic kidney disease: Secondary | ICD-10-CM | POA: Insufficient documentation

## 2022-10-13 DIAGNOSIS — W06XXXA Fall from bed, initial encounter: Secondary | ICD-10-CM | POA: Insufficient documentation

## 2022-10-13 DIAGNOSIS — Z515 Encounter for palliative care: Secondary | ICD-10-CM

## 2022-10-13 DIAGNOSIS — W19XXXA Unspecified fall, initial encounter: Secondary | ICD-10-CM

## 2022-10-13 LAB — CBC WITH DIFFERENTIAL/PLATELET
Abs Immature Granulocytes: 0.19 10*3/uL — ABNORMAL HIGH (ref 0.00–0.07)
Basophils Absolute: 0.1 10*3/uL (ref 0.0–0.1)
Basophils Relative: 0 %
Eosinophils Absolute: 0.1 10*3/uL (ref 0.0–0.5)
Eosinophils Relative: 0 %
HCT: 31 % — ABNORMAL LOW (ref 39.0–52.0)
Hemoglobin: 9.8 g/dL — ABNORMAL LOW (ref 13.0–17.0)
Immature Granulocytes: 1 %
Lymphocytes Relative: 2 %
Lymphs Abs: 0.6 10*3/uL — ABNORMAL LOW (ref 0.7–4.0)
MCH: 27.9 pg (ref 26.0–34.0)
MCHC: 31.6 g/dL (ref 30.0–36.0)
MCV: 88.3 fL (ref 80.0–100.0)
Monocytes Absolute: 1 10*3/uL (ref 0.1–1.0)
Monocytes Relative: 4 %
Neutro Abs: 25.3 10*3/uL — ABNORMAL HIGH (ref 1.7–7.7)
Neutrophils Relative %: 93 %
Platelets: 364 10*3/uL (ref 150–400)
RBC: 3.51 MIL/uL — ABNORMAL LOW (ref 4.22–5.81)
RDW: 19.8 % — ABNORMAL HIGH (ref 11.5–15.5)
WBC: 27.2 10*3/uL — ABNORMAL HIGH (ref 4.0–10.5)
nRBC: 0 % (ref 0.0–0.2)

## 2022-10-13 LAB — COMPREHENSIVE METABOLIC PANEL
ALT: 9 U/L (ref 0–44)
AST: 11 U/L — ABNORMAL LOW (ref 15–41)
Albumin: 2 g/dL — ABNORMAL LOW (ref 3.5–5.0)
Alkaline Phosphatase: 131 U/L — ABNORMAL HIGH (ref 38–126)
Anion gap: 21 — ABNORMAL HIGH (ref 5–15)
BUN: 77 mg/dL — ABNORMAL HIGH (ref 8–23)
CO2: 18 mmol/L — ABNORMAL LOW (ref 22–32)
Calcium: 9 mg/dL (ref 8.9–10.3)
Chloride: 98 mmol/L (ref 98–111)
Creatinine, Ser: 8.79 mg/dL — ABNORMAL HIGH (ref 0.61–1.24)
GFR, Estimated: 6 mL/min — ABNORMAL LOW (ref >60)
Glucose, Bld: 62 mg/dL — ABNORMAL LOW (ref 70–99)
Potassium: 5.9 mmol/L — ABNORMAL HIGH (ref 3.5–5.1)
Sodium: 137 mmol/L (ref 135–145)
Total Bilirubin: 0.9 mg/dL (ref 0.3–1.2)
Total Protein: 4.7 g/dL — ABNORMAL LOW (ref 6.5–8.1)

## 2022-10-13 LAB — LACTIC ACID, PLASMA: Lactic Acid, Venous: 1 mmol/L (ref 0.5–1.9)

## 2022-10-13 LAB — CBG MONITORING, ED
Glucose-Capillary: 60 mg/dL — ABNORMAL LOW (ref 70–99)
Glucose-Capillary: 75 mg/dL (ref 70–99)

## 2022-10-13 NOTE — ED Provider Triage Note (Signed)
Emergency Medicine Provider Triage Evaluation Note  Christian Dalton , a 81 y.o. male  was evaluated in triage.  Pt complains of fall from bed.  Difficult to obtain history from patient.  Per EMS, had a fall that was unwitnessed out of his bed earlier this morning.  He was found at [facility] to his bed.  Patient has refused dialysis for at least the past 2 treatments.  He currently endorses no pain from the fall.  Is unaware of whether or not he hit his head.  Denies loss of consciousness.   Review of Systems  Positive: See above Negative:   Physical Exam  BP 126/67 (BP Location: Right Arm)   Pulse 96   Temp 98.4 F (36.9 C) (Oral)   Resp 20   SpO2 97%  Gen:   Awake, no distress see above Resp:  Normal effort  MSK:   Moves extremities without difficulty  Other:  Gangrenous left first through third lower extremity digits.  No tenderness palpation of upper or lower extremities.  No tenderness palpation midline of cervical, thoracic, lumbar spine.  No anterior chest wall tenderness.  No abdominal tenderness.  Skin tear noted on patient's right forearm which is stated to be from prior fall.  Medical Decision Making  Medically screening exam initiated at 2:55 PM.  Appropriate orders placed.  Panayiotis Schauer was informed that the remainder of the evaluation will be completed by another provider, this initial triage assessment does not replace that evaluation, and the importance of remaining in the ED until their evaluation is complete.    Wilnette Kales, [place] 10/13/22 1457

## 2022-10-13 NOTE — ED Notes (Signed)
[facility] Called for transport

## 2022-10-13 NOTE — ED Notes (Signed)
Called [facility] and spoke with Bedelia Person, RN and gave report.

## 2022-10-13 NOTE — ED Provider Notes (Signed)
[facility] EMERGENCY DEPARTMENT Provider Note   CSN: [id number] Arrival date & time: 10/13/22  1424     History  Chief Complaint  Patient presents with   [facility] Christian Dalton is a 81 y.o. male.   Fall  Patient presents after fall.  Reportedly fell out of bed this morning.  Patient states he just feels little bad all over.  Reportedly has refused dialysis.  He is Tuesday Thursday Saturday and refused Saturday with today being Monday.  States he feels a little short of breath.    Past Medical History:  Diagnosis Date   Acute cholecystitis 08/17/2018   Anemia    Arthritis    Atrial fibrillation (HCC)    CAD (coronary artery disease)    Cholecystitis    COPD (chronic obstructive pulmonary disease) (HCC)    Depression    Diabetes mellitus without complication (HCC)    Dysrhythmia    ESRD (end stage renal disease) on dialysis (HCC)    HOH (hard of hearing)    Hypertension    Hypothyroidism    OSA (obstructive sleep apnea)    does not wear cpap   Peripheral vascular disease ([place])    Pneumonia    Spinal stenosis    Wears dentures    wears top dentures   Wears glasses    Wears hearing aid in both ears     Home Medications Prior to Admission medications   Medication Sig Start Date End Date Taking? Authorizing Provider  acetaminophen (TYLENOL) 325 MG tablet Take 2 tablets (650 mg total) by mouth every 6 (six) hours as needed for moderate pain. 09/29/22   Linus Galas, MD  aspirin EC 81 MG tablet Take 81 mg by mouth at bedtime.     [provider]  atorvastatin (LIPITOR) 10 MG tablet Take 10 mg by mouth at bedtime.    [provider]  buPROPion (WELLBUTRIN XL) 150 MG 24 hr tablet Take 150 mg by mouth every evening.    [provider]  docusate sodium (COLACE) 100 MG capsule Take 100 mg by mouth 2 (two) times daily.     [provider]  escitalopram (LEXAPRO) 10 MG tablet Take 10 mg by mouth at bedtime.   08/30/15   [provider]  fluticasone (FLONASE) 50 MCG/ACT nasal spray Place 1 spray into both nostrils daily as needed for allergies or rhinitis.    [provider]  folic acid (FOLVITE) 1 MG tablet Take 1 mg by mouth daily.    [provider]  lactulose, encephalopathy, (CHRONULAC) 10 GM/15ML SOLN Take 10 g by mouth every evening. 08/02/18   [provider]  levothyroxine (SYNTHROID, LEVOTHROID) 125 MCG tablet Take 125 mcg by mouth daily before breakfast.    [provider]  lidocaine-prilocaine (EMLA) cream Apply 1 application topically Every Tuesday,Thursday,and Saturday with dialysis. 08/02/18   [provider]  linaclotide (LINZESS) 290 MCG CAPS capsule Take 290 mcg by mouth every evening.     [provider]  loratadine (CLARITIN) 10 MG tablet Take 10 mg by mouth daily.     [provider]  midodrine (PROAMATINE) 10 MG tablet Please administer midodrine PRIOR to dialysis on HD days. May adjust timing if HD is scheduled for later in the day. 09/30/22   Linus Galas, MD  montelukast (SINGULAIR) 10 MG tablet Take 10 mg every evening by mouth.    [provider]  nitroGLYCERIN (NITROSTAT) 0.4 MG SL tablet Place 0.4  mg under the tongue every 5 (five) minutes as needed for chest pain.    [provider]  PROAIR HFA 108 [id number] Base) MCG/ACT inhaler Inhale 2 puffs into the lungs every 6 (six) hours as needed for shortness of breath. 05/27/20   [provider]  Propylene Glycol (SYSTANE BALANCE) 0.6 % SOLN Place 1 drop into both eyes daily as needed (dry eyes).    [provider]  sevelamer carbonate (RENVELA) 800 MG tablet Take 3 tablets (2,400 mg total) by mouth 3 (three) times daily with meals. Patient taking differently: Take 2,400 mg by mouth 2 (two) times daily with a meal. 08/24/18   Elgergawy, Silver Huguenin, MD      Allergies    Avelox [moxifloxacin hcl], Gabapentin, Metoprolol, Quinolones,  Shellfish allergy, Sulfa antibiotics, Tetracyclines & related, and Adhesive [tape]    Review of Systems   Review of Systems  Physical Exam Updated Vital Signs BP 111/68   Pulse 74   Temp 97.9 F (36.6 C)   Resp 16   SpO2 93%  Physical Exam Vitals and nursing note reviewed.  HENT:     Head: Normocephalic and atraumatic.  Eyes:     Pupils: Pupils are equal, round, and reactive to light.  Cardiovascular:     Rate and Rhythm: Normal rate.  Pulmonary:     Breath sounds: Rales present.  Abdominal:     Tenderness: There is no abdominal tenderness.  Musculoskeletal:        General: No tenderness.  Skin:    Comments: Skin tear right forearm.  No underlying bony tenderness.  Reportedly was present prior to fall.     ED Results / Procedures / Treatments   Labs (all labs ordered are listed, but only abnormal results are displayed) Labs Reviewed  COMPREHENSIVE METABOLIC PANEL - Abnormal; Notable for the following components:      Result Value   Potassium 5.9 (*)    CO2 18 (*)    Glucose, Bld 62 (*)    BUN 77 (*)    Creatinine, Ser 8.79 (*)    Total Protein 4.7 (*)    Albumin 2.0 (*)    AST 11 (*)    Alkaline Phosphatase 131 (*)    GFR, Estimated 6 (*)    Anion gap 21 (*)    All other components within normal limits  CBC WITH DIFFERENTIAL/PLATELET - Abnormal; Notable for the following components:   WBC 27.2 (*)    RBC 3.51 (*)    Hemoglobin 9.8 (*)    HCT 31.0 (*)    RDW 19.8 (*)    Neutro Abs 25.3 (*)    Lymphs Abs 0.6 (*)    Abs Immature Granulocytes 0.19 (*)    All other components within normal limits  CBG MONITORING, ED - Abnormal; Notable for the following components:   Glucose-Capillary 60 (*)    All other components within normal limits  LACTIC ACID, PLASMA  CBG MONITORING, ED    EKG None  Radiology CT Head Wo Contrast  Result Date: 10/13/2022 CLINICAL DATA:  Unwitnessed fall EXAM: CT HEAD WITHOUT CONTRAST CT CERVICAL SPINE WITHOUT CONTRAST  TECHNIQUE: Multidetector CT imaging of the head and cervical spine was performed following the standard protocol without intravenous contrast. Multiplanar CT image reconstructions of the cervical spine were also generated. RADIATION DOSE REDUCTION: This exam was performed according to the departmental dose-optimization program which includes automated exposure control, adjustment of the mA and/or kV according to patient size and/or use  of iterative reconstruction technique. COMPARISON:  09/09/22 CXR, CT head 01/02/22 FINDINGS: CT HEAD FINDINGS Brain: No evidence of acute infarction, hemorrhage, hydrocephalus, extra-axial collection or mass lesion/mass effect. There is sequela of mild to moderate chronic microvascular ischemic change. Advanced generalized volume loss. Vascular: No hyperdense vessel or unexpected calcification. Skull: Normal. Negative for fracture or focal lesion. Sinuses/Orbits: Bilateral lens replacement. Mild mucosal thickening bilateral ethmoid and left frontal sinus Other: None. CT CERVICAL SPINE FINDINGS Alignment: Normal. Skull base and vertebrae: No acute fracture. No primary bone lesion or focal pathologic process. Soft tissues and spinal canal: No prevertebral fluid or swelling. No visible canal hematoma. Disc levels:  No evidence of high-grade spinal canal stenosis. Upper chest: Large right pleural effusion, new or increased in size compared to 09/10/2019. Other: None IMPRESSION: 1. No CT evidence of intracranial injury. 2. No acute fracture or traumatic subluxation of the cervical spine. 3. Large right pleural effusion, new or significantly increased in size compared to chest radiograph from 09/09/22. Recommend further evaluation with a dedicated chest radiograph or chest CT. Electronically Signed   By: Marin Roberts M.D.   On: 10/13/2022 15:51   CT Cervical Spine Wo Contrast  Result Date: 10/13/2022 CLINICAL DATA:  Unwitnessed fall EXAM: CT HEAD WITHOUT CONTRAST CT CERVICAL SPINE  WITHOUT CONTRAST TECHNIQUE: Multidetector CT imaging of the head and cervical spine was performed following the standard protocol without intravenous contrast. Multiplanar CT image reconstructions of the cervical spine were also generated. RADIATION DOSE REDUCTION: This exam was performed according to the departmental dose-optimization program which includes automated exposure control, adjustment of the mA and/or kV according to patient size and/or use of iterative reconstruction technique. COMPARISON:  09/09/22 CXR, CT head 01/02/22 FINDINGS: CT HEAD FINDINGS Brain: No evidence of acute infarction, hemorrhage, hydrocephalus, extra-axial collection or mass lesion/mass effect. There is sequela of mild to moderate chronic microvascular ischemic change. Advanced generalized volume loss. Vascular: No hyperdense vessel or unexpected calcification. Skull: Normal. Negative for fracture or focal lesion. Sinuses/Orbits: Bilateral lens replacement. Mild mucosal thickening bilateral ethmoid and left frontal sinus Other: None. CT CERVICAL SPINE FINDINGS Alignment: Normal. Skull base and vertebrae: No acute fracture. No primary bone lesion or focal pathologic process. Soft tissues and spinal canal: No prevertebral fluid or swelling. No visible canal hematoma. Disc levels:  No evidence of high-grade spinal canal stenosis. Upper chest: Large right pleural effusion, new or increased in size compared to 09/10/2019. Other: None IMPRESSION: 1. No CT evidence of intracranial injury. 2. No acute fracture or traumatic subluxation of the cervical spine. 3. Large right pleural effusion, new or significantly increased in size compared to chest radiograph from 09/09/22. Recommend further evaluation with a dedicated chest radiograph or chest CT. Electronically Signed   By: Marin Roberts M.D.   On: 10/13/2022 15:51   DG Foot Complete Left  Result Date: 10/13/2022 CLINICAL DATA:  Left foot pain, open wound in the skin EXAM: LEFT FOOT -  COMPLETE 3+ VIEW COMPARISON:  09/15/2022 FINDINGS: No fracture or dislocation is seen. There are no focal lytic lesions. Extensive arterial calcifications are seen in soft tissues. Small bony spurs seen in first metatarsophalangeal joint. Calcifications are seen in the region of Achilles tendon close to calcaneus. This may suggest calcific tendinosis. IMPRESSION: No fracture or dislocation is seen. No focal lytic lesions are seen. If there is clinical suspicion for osteomyelitis, follow-up MRI may be considered. Severe arteriosclerosis. Electronically Signed   By: Elmer Picker M.D.   On: 10/13/2022 15:45  Procedures Procedures    Medications Ordered in ED Medications - No data to display  ED Course/ Medical Decision Making/ A&P                           Medical Decision Making Amount and/or Complexity of Data Reviewed Labs: ordered. Radiology: ordered.   Patient with fall from bed.  Abrasion to forearm.  Has necrotic toes on left foot.  Is a dialysis patient has not gone the last day.  Head CT and cervical spine CT reassuring.  Foot x-ray reassuring.  However did have large effusion on the left.  Also hyperkalemic.  However after further discussion with patient the patient's son and the patient's wife patient wishes to become a DNR and comfort care/hospice.  Does not want to come in the hospital.  States that the nursing home social worker has been working on other placement.  At this point I do not think we need further work-up.  Has made a DNR here.  I have put a consult into palliative medicine but I do not think we need them to see right now but hopefully they could help facilitate outpatient hospice placement if social work at the nursing home was having difficult time.  However will discharge.         Final Clinical Impression(s) / ED Diagnoses Final diagnoses:  Fall, initial encounter  Palliative care status  End stage renal disease [facility])    Rx / DC Orders ED  Discharge Orders     None         Davonna Belling, MD 10/13/22 1831

## 2022-10-13 NOTE — ED Notes (Signed)
Pt agreed to drink orange juice. Orange juice provided.  Family at bedside.

## 2022-10-13 NOTE — ED Notes (Signed)
Report given to [facility]. Pt to be transported back to [facility].

## 2022-10-13 NOTE — ED Triage Notes (Signed)
Patient BIB [facility] from [facility] for evaluation of a fall, patient had an unwitnessed fall from his bed and was found on the floor next to his bed. Patient has refused dialysis for at least the last two treatments, fistula in left arm, EMS unsure of last treatment. No anticoagulants per EMS. Patient is alert and in no apparent distress at this time.

## 2022-10-15 ENCOUNTER — Telehealth: Payer: Self-pay

## 2022-10-24 DEATH — deceased
